# Patient Record
Sex: Male | Born: 1950
Health system: Southern US, Community
[De-identification: ages and names within clinical notes are randomized; demographics above are authoritative.]

## PROBLEM LIST (undated history)

## (undated) DIAGNOSIS — C220 Liver cell carcinoma: Secondary | ICD-10-CM

## (undated) DIAGNOSIS — K56609 Unspecified intestinal obstruction, unspecified as to partial versus complete obstruction: Secondary | ICD-10-CM

## (undated) DIAGNOSIS — I10 Essential (primary) hypertension: Secondary | ICD-10-CM

## (undated) DIAGNOSIS — Z9289 Personal history of other medical treatment: Secondary | ICD-10-CM

## (undated) HISTORY — PX: COLONOSCOPY W/ POLYPECTOMY: SHX1380

## (undated) HISTORY — DX: Unspecified intestinal obstruction, unspecified as to partial versus complete obstruction: K56.609

## (undated) HISTORY — PX: COLON SURGERY: SHX602

---
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
## 2004-04-06 ENCOUNTER — Emergency Department: Payer: Self-pay | Admitting: Emergency Medicine

## 2004-04-22 ENCOUNTER — Emergency Department: Payer: Self-pay | Admitting: Unknown Physician Specialty

## 2005-01-24 ENCOUNTER — Emergency Department: Payer: Self-pay | Admitting: General Practice

## 2009-07-27 ENCOUNTER — Emergency Department: Payer: Self-pay | Admitting: Internal Medicine

## 2013-05-18 ENCOUNTER — Emergency Department: Payer: Self-pay | Admitting: Emergency Medicine

## 2013-05-18 LAB — CBC
HCT: 45.4 % (ref 40.0–52.0)
HGB: 15.1 g/dL (ref 13.0–18.0)
MCH: 32.2 pg (ref 26.0–34.0)
MCHC: 33.2 g/dL (ref 32.0–36.0)
MCV: 97 fL (ref 80–100)
Platelet: 210 10*3/uL (ref 150–440)
RBC: 4.68 10*6/uL (ref 4.40–5.90)
RDW: 14.7 % — ABNORMAL HIGH (ref 11.5–14.5)
WBC: 6.8 10*3/uL (ref 3.8–10.6)

## 2013-05-18 LAB — COMPREHENSIVE METABOLIC PANEL
Albumin: 3.4 g/dL (ref 3.4–5.0)
Alkaline Phosphatase: 70 U/L
Anion Gap: 7 (ref 7–16)
BUN: 7 mg/dL (ref 7–18)
Bilirubin,Total: 0.7 mg/dL (ref 0.2–1.0)
Calcium, Total: 9.1 mg/dL (ref 8.5–10.1)
Chloride: 104 mmol/L (ref 98–107)
Co2: 29 mmol/L (ref 21–32)
Creatinine: 0.79 mg/dL (ref 0.60–1.30)
EGFR (African American): >60
EGFR (Non-African Amer.): >60
Glucose: 94 mg/dL (ref 65–99)
Osmolality: 277 (ref 275–301)
Potassium: 3.3 mmol/L — ABNORMAL LOW (ref 3.5–5.1)
SGOT(AST): 65 U/L — ABNORMAL HIGH (ref 15–37)
SGPT (ALT): 63 U/L (ref 12–78)
Sodium: 140 mmol/L (ref 136–145)
Total Protein: 8 g/dL (ref 6.4–8.2)

## 2013-08-20 ENCOUNTER — Ambulatory Visit: Payer: Self-pay | Admitting: Gastroenterology

## 2013-08-21 LAB — PATHOLOGY REPORT

## 2013-08-27 ENCOUNTER — Ambulatory Visit: Payer: Self-pay | Admitting: Surgery

## 2013-08-27 LAB — BASIC METABOLIC PANEL
Anion Gap: 2 — ABNORMAL LOW (ref 7–16)
BUN: 9 mg/dL (ref 7–18)
Calcium, Total: 8.7 mg/dL (ref 8.5–10.1)
Chloride: 104 mmol/L (ref 98–107)
Co2: 30 mmol/L (ref 21–32)
Creatinine: 0.69 mg/dL (ref 0.60–1.30)
EGFR (African American): >60
EGFR (Non-African Amer.): >60
Glucose: 90 mg/dL (ref 65–99)
Osmolality: 270 (ref 275–301)
Potassium: 3.6 mmol/L (ref 3.5–5.1)
Sodium: 136 mmol/L (ref 136–145)

## 2013-08-27 LAB — CBC WITH DIFFERENTIAL/PLATELET
Basophil #: 0 10*3/uL (ref 0.0–0.1)
Basophil %: 0.7 %
Eosinophil #: 0.1 10*3/uL (ref 0.0–0.7)
Eosinophil %: 2 %
HCT: 40.8 % (ref 40.0–52.0)
HGB: 13.5 g/dL (ref 13.0–18.0)
Lymphocyte #: 2.6 10*3/uL (ref 1.0–3.6)
Lymphocyte %: 41.1 %
MCH: 31 pg (ref 26.0–34.0)
MCHC: 33.1 g/dL (ref 32.0–36.0)
MCV: 94 fL (ref 80–100)
Monocyte #: 0.6 x10 3/mm (ref 0.2–1.0)
Monocyte %: 10.1 %
Neutrophil #: 2.9 10*3/uL (ref 1.4–6.5)
Neutrophil %: 46.1 %
Platelet: 325 10*3/uL (ref 150–440)
RBC: 4.35 10*6/uL — ABNORMAL LOW (ref 4.40–5.90)
RDW: 14.8 % — ABNORMAL HIGH (ref 11.5–14.5)
WBC: 6.3 10*3/uL (ref 3.8–10.6)

## 2013-09-03 ENCOUNTER — Inpatient Hospital Stay: Payer: Self-pay | Admitting: Surgery

## 2013-09-03 LAB — CREATININE, SERUM
Creatinine: 0.87 mg/dL (ref 0.60–1.30)
EGFR (African American): >60
EGFR (Non-African Amer.): >60

## 2013-09-03 LAB — POTASSIUM: Potassium: 4.2 mmol/L (ref 3.5–5.1)

## 2013-09-04 DIAGNOSIS — I498 Other specified cardiac arrhythmias: Secondary | ICD-10-CM

## 2013-09-04 LAB — CBC WITH DIFFERENTIAL/PLATELET
Basophil #: 0.1 10*3/uL (ref 0.0–0.1)
Basophil %: 0.5 %
Eosinophil #: 0 10*3/uL (ref 0.0–0.7)
Eosinophil %: 0.2 %
HCT: 25.9 % — ABNORMAL LOW (ref 40.0–52.0)
HGB: 8.5 g/dL — ABNORMAL LOW (ref 13.0–18.0)
Lymphocyte #: 1.7 10*3/uL (ref 1.0–3.6)
Lymphocyte %: 9.2 %
MCH: 31.5 pg (ref 26.0–34.0)
MCHC: 33 g/dL (ref 32.0–36.0)
MCV: 95 fL (ref 80–100)
Monocyte #: 1.2 x10 3/mm — ABNORMAL HIGH (ref 0.2–1.0)
Monocyte %: 6.4 %
Neutrophil #: 15.2 10*3/uL — ABNORMAL HIGH (ref 1.4–6.5)
Neutrophil %: 83.7 %
Platelet: 255 10*3/uL (ref 150–440)
RBC: 2.72 10*6/uL — ABNORMAL LOW (ref 4.40–5.90)
RDW: 15.8 % — ABNORMAL HIGH (ref 11.5–14.5)
WBC: 18.1 10*3/uL — ABNORMAL HIGH (ref 3.8–10.6)

## 2013-09-04 LAB — BASIC METABOLIC PANEL
Anion Gap: 13 (ref 7–16)
BUN: 15 mg/dL (ref 7–18)
Calcium, Total: 8 mg/dL — ABNORMAL LOW (ref 8.5–10.1)
Chloride: 103 mmol/L (ref 98–107)
Co2: 22 mmol/L (ref 21–32)
Creatinine: 1.94 mg/dL — ABNORMAL HIGH (ref 0.60–1.30)
EGFR (African American): 42 — ABNORMAL LOW
EGFR (Non-African Amer.): 36 — ABNORMAL LOW
Glucose: 196 mg/dL — ABNORMAL HIGH (ref 65–99)
Osmolality: 282 (ref 275–301)
Potassium: 4.4 mmol/L (ref 3.5–5.1)
Sodium: 138 mmol/L (ref 136–145)

## 2013-09-04 LAB — CK TOTAL AND CKMB (NOT AT ARMC)
CK, Total: 264 U/L
CK-MB: 1.2 ng/mL (ref 0.5–3.6)

## 2013-09-04 LAB — HEMOGLOBIN
HGB: 8.4 g/dL — ABNORMAL LOW (ref 13.0–18.0)
HGB: 7.8 g/dL — ABNORMAL LOW (ref 13.0–18.0)

## 2013-09-04 LAB — TROPONIN I: Troponin-I: <0.02 ng/mL

## 2013-09-05 LAB — CBC WITH DIFFERENTIAL/PLATELET
Basophil #: 0 10*3/uL (ref 0.0–0.1)
Basophil %: 0.1 %
Eosinophil #: 0 10*3/uL (ref 0.0–0.7)
Eosinophil %: 0 %
HCT: 20.2 % — ABNORMAL LOW (ref 40.0–52.0)
HGB: 6.9 g/dL — ABNORMAL LOW (ref 13.0–18.0)
Lymphocyte #: 1.3 10*3/uL (ref 1.0–3.6)
Lymphocyte %: 8.9 %
MCH: 29.7 pg (ref 26.0–34.0)
MCHC: 34.1 g/dL (ref 32.0–36.0)
MCV: 87 fL (ref 80–100)
Monocyte #: 1.3 x10 3/mm — ABNORMAL HIGH (ref 0.2–1.0)
Monocyte %: 9.1 %
Neutrophil #: 11.8 10*3/uL — ABNORMAL HIGH (ref 1.4–6.5)
Neutrophil %: 81.9 %
Platelet: 171 10*3/uL (ref 150–440)
RBC: 2.32 10*6/uL — ABNORMAL LOW (ref 4.40–5.90)
RDW: 19.4 % — ABNORMAL HIGH (ref 11.5–14.5)
WBC: 14.4 10*3/uL — ABNORMAL HIGH (ref 3.8–10.6)

## 2013-09-05 LAB — BASIC METABOLIC PANEL
Anion Gap: 8 (ref 7–16)
BUN: 18 mg/dL (ref 7–18)
Calcium, Total: 7.4 mg/dL — ABNORMAL LOW (ref 8.5–10.1)
Chloride: 109 mmol/L — ABNORMAL HIGH (ref 98–107)
Co2: 24 mmol/L (ref 21–32)
Creatinine: 0.98 mg/dL (ref 0.60–1.30)
EGFR (African American): >60
EGFR (Non-African Amer.): >60
Glucose: 107 mg/dL — ABNORMAL HIGH (ref 65–99)
Osmolality: 284 (ref 275–301)
Potassium: 4.1 mmol/L (ref 3.5–5.1)
Sodium: 141 mmol/L (ref 136–145)

## 2013-09-05 LAB — HEMOGLOBIN: HGB: 9.4 g/dL — ABNORMAL LOW (ref 13.0–18.0)

## 2013-09-06 LAB — CBC WITH DIFFERENTIAL/PLATELET
Basophil #: 0 10*3/uL (ref 0.0–0.1)
Basophil %: 0.1 %
Eosinophil #: 0 10*3/uL (ref 0.0–0.7)
Eosinophil %: 0.1 %
HCT: 27.1 % — ABNORMAL LOW (ref 40.0–52.0)
HGB: 8.9 g/dL — ABNORMAL LOW (ref 13.0–18.0)
Lymphocyte #: 1.2 10*3/uL (ref 1.0–3.6)
Lymphocyte %: 9.6 %
MCH: 28.7 pg (ref 26.0–34.0)
MCHC: 32.8 g/dL (ref 32.0–36.0)
MCV: 88 fL (ref 80–100)
Monocyte #: 0.9 x10 3/mm (ref 0.2–1.0)
Monocyte %: 7.5 %
Neutrophil #: 10.1 10*3/uL — ABNORMAL HIGH (ref 1.4–6.5)
Neutrophil %: 82.7 %
Platelet: 179 10*3/uL (ref 150–440)
RBC: 3.09 10*6/uL — ABNORMAL LOW (ref 4.40–5.90)
RDW: 18.9 % — ABNORMAL HIGH (ref 11.5–14.5)
WBC: 12.2 10*3/uL — ABNORMAL HIGH (ref 3.8–10.6)

## 2013-09-06 LAB — BASIC METABOLIC PANEL
Anion Gap: 9 (ref 7–16)
BUN: 13 mg/dL (ref 7–18)
Calcium, Total: 7.7 mg/dL — ABNORMAL LOW (ref 8.5–10.1)
Chloride: 108 mmol/L — ABNORMAL HIGH (ref 98–107)
Co2: 25 mmol/L (ref 21–32)
Creatinine: 0.64 mg/dL (ref 0.60–1.30)
EGFR (African American): >60
EGFR (Non-African Amer.): >60
Glucose: 96 mg/dL (ref 65–99)
Osmolality: 283 (ref 275–301)
Potassium: 3.6 mmol/L (ref 3.5–5.1)
Sodium: 142 mmol/L (ref 136–145)

## 2013-09-06 LAB — PATHOLOGY REPORT

## 2013-09-07 LAB — BASIC METABOLIC PANEL
Anion Gap: 8 (ref 7–16)
BUN: 13 mg/dL (ref 7–18)
Calcium, Total: 7.8 mg/dL — ABNORMAL LOW (ref 8.5–10.1)
Chloride: 111 mmol/L — ABNORMAL HIGH (ref 98–107)
Co2: 25 mmol/L (ref 21–32)
Creatinine: 0.64 mg/dL (ref 0.60–1.30)
EGFR (African American): >60
EGFR (Non-African Amer.): >60
Glucose: 84 mg/dL (ref 65–99)
Osmolality: 286 (ref 275–301)
Potassium: 3.6 mmol/L (ref 3.5–5.1)
Sodium: 144 mmol/L (ref 136–145)

## 2013-09-07 LAB — CBC WITH DIFFERENTIAL/PLATELET
Basophil #: 0 10*3/uL (ref 0.0–0.1)
Basophil %: 0.1 %
Eosinophil #: 0.1 10*3/uL (ref 0.0–0.7)
Eosinophil %: 0.5 %
HCT: 28.4 % — ABNORMAL LOW (ref 40.0–52.0)
HGB: 9.4 g/dL — ABNORMAL LOW (ref 13.0–18.0)
Lymphocyte #: 1.6 10*3/uL (ref 1.0–3.6)
Lymphocyte %: 14.8 %
MCH: 29.3 pg (ref 26.0–34.0)
MCHC: 32.9 g/dL (ref 32.0–36.0)
MCV: 89 fL (ref 80–100)
Monocyte #: 0.8 x10 3/mm (ref 0.2–1.0)
Monocyte %: 7.8 %
Neutrophil #: 8.1 10*3/uL — ABNORMAL HIGH (ref 1.4–6.5)
Neutrophil %: 76.8 %
Platelet: 232 10*3/uL (ref 150–440)
RBC: 3.19 10*6/uL — ABNORMAL LOW (ref 4.40–5.90)
RDW: 18.3 % — ABNORMAL HIGH (ref 11.5–14.5)
WBC: 10.6 10*3/uL (ref 3.8–10.6)

## 2013-09-09 LAB — CBC WITH DIFFERENTIAL/PLATELET
Basophil #: 0 10*3/uL (ref 0.0–0.1)
Basophil %: 0.2 %
Eosinophil #: 0.1 10*3/uL (ref 0.0–0.7)
Eosinophil %: 1.2 %
HCT: 24.6 % — ABNORMAL LOW (ref 40.0–52.0)
HGB: 8.4 g/dL — ABNORMAL LOW (ref 13.0–18.0)
Lymphocyte #: 0.9 10*3/uL — ABNORMAL LOW (ref 1.0–3.6)
Lymphocyte %: 11.9 %
MCH: 30.1 pg (ref 26.0–34.0)
MCHC: 34.3 g/dL (ref 32.0–36.0)
MCV: 88 fL (ref 80–100)
Monocyte #: 1.1 x10 3/mm — ABNORMAL HIGH (ref 0.2–1.0)
Monocyte %: 14 %
Neutrophil #: 5.6 10*3/uL (ref 1.4–6.5)
Neutrophil %: 72.7 %
Platelet: 288 10*3/uL (ref 150–440)
RBC: 2.81 10*6/uL — ABNORMAL LOW (ref 4.40–5.90)
RDW: 18.5 % — ABNORMAL HIGH (ref 11.5–14.5)
WBC: 7.7 10*3/uL (ref 3.8–10.6)

## 2013-09-09 LAB — BASIC METABOLIC PANEL
Anion Gap: 6 — ABNORMAL LOW (ref 7–16)
BUN: 11 mg/dL (ref 7–18)
Calcium, Total: 7.9 mg/dL — ABNORMAL LOW (ref 8.5–10.1)
Chloride: 108 mmol/L — ABNORMAL HIGH (ref 98–107)
Co2: 32 mmol/L (ref 21–32)
Creatinine: 0.64 mg/dL (ref 0.60–1.30)
EGFR (African American): >60
EGFR (Non-African Amer.): >60
Glucose: 93 mg/dL (ref 65–99)
Osmolality: 290 (ref 275–301)
Potassium: 3.1 mmol/L — ABNORMAL LOW (ref 3.5–5.1)
Sodium: 146 mmol/L — ABNORMAL HIGH (ref 136–145)

## 2013-09-10 LAB — CBC WITH DIFFERENTIAL/PLATELET
Basophil #: 0 10*3/uL (ref 0.0–0.1)
Basophil %: 0.2 %
Eosinophil #: 0.1 10*3/uL (ref 0.0–0.7)
Eosinophil %: 1.4 %
HCT: 26.7 % — ABNORMAL LOW (ref 40.0–52.0)
HGB: 9 g/dL — ABNORMAL LOW (ref 13.0–18.0)
Lymphocyte #: 1.5 10*3/uL (ref 1.0–3.6)
Lymphocyte %: 15.1 %
MCH: 30 pg (ref 26.0–34.0)
MCHC: 33.7 g/dL (ref 32.0–36.0)
MCV: 89 fL (ref 80–100)
Monocyte #: 1.1 x10 3/mm — ABNORMAL HIGH (ref 0.2–1.0)
Monocyte %: 11.4 %
Neutrophil #: 7.1 10*3/uL — ABNORMAL HIGH (ref 1.4–6.5)
Neutrophil %: 71.9 %
Platelet: 349 10*3/uL (ref 150–440)
RBC: 3 10*6/uL — ABNORMAL LOW (ref 4.40–5.90)
RDW: 18.6 % — ABNORMAL HIGH (ref 11.5–14.5)
WBC: 9.9 10*3/uL (ref 3.8–10.6)

## 2013-09-10 LAB — BASIC METABOLIC PANEL
Anion Gap: 6 — ABNORMAL LOW (ref 7–16)
BUN: 9 mg/dL (ref 7–18)
Calcium, Total: 7.9 mg/dL — ABNORMAL LOW (ref 8.5–10.1)
Chloride: 107 mmol/L (ref 98–107)
Co2: 30 mmol/L (ref 21–32)
Creatinine: 0.77 mg/dL (ref 0.60–1.30)
EGFR (African American): >60
EGFR (Non-African Amer.): >60
Glucose: 91 mg/dL (ref 65–99)
Osmolality: 283 (ref 275–301)
Potassium: 3.5 mmol/L (ref 3.5–5.1)
Sodium: 143 mmol/L (ref 136–145)

## 2013-09-12 LAB — CBC WITH DIFFERENTIAL/PLATELET
Basophil #: 0 10*3/uL (ref 0.0–0.1)
Basophil %: 0.1 %
Eosinophil #: 0.1 10*3/uL (ref 0.0–0.7)
Eosinophil %: 0.9 %
HCT: 27.6 % — ABNORMAL LOW (ref 40.0–52.0)
HGB: 9.2 g/dL — ABNORMAL LOW (ref 13.0–18.0)
Lymphocyte #: 1.7 10*3/uL (ref 1.0–3.6)
Lymphocyte %: 13.5 %
MCH: 29.3 pg (ref 26.0–34.0)
MCHC: 33.3 g/dL (ref 32.0–36.0)
MCV: 88 fL (ref 80–100)
Monocyte #: 1.1 x10 3/mm — ABNORMAL HIGH (ref 0.2–1.0)
Monocyte %: 8.9 %
Neutrophil #: 9.5 10*3/uL — ABNORMAL HIGH (ref 1.4–6.5)
Neutrophil %: 76.6 %
Platelet: 516 10*3/uL — ABNORMAL HIGH (ref 150–440)
RBC: 3.13 10*6/uL — ABNORMAL LOW (ref 4.40–5.90)
RDW: 18.3 % — ABNORMAL HIGH (ref 11.5–14.5)
WBC: 12.4 10*3/uL — ABNORMAL HIGH (ref 3.8–10.6)

## 2013-09-12 LAB — PROTIME-INR
INR: 1.1
Prothrombin Time: 13.8 secs (ref 11.5–14.7)

## 2013-09-12 LAB — APTT: Activated PTT: 37.7 secs — ABNORMAL HIGH (ref 23.6–35.9)

## 2013-09-13 LAB — COMPREHENSIVE METABOLIC PANEL
Albumin: 1.9 g/dL — ABNORMAL LOW (ref 3.4–5.0)
Alkaline Phosphatase: 218 U/L — ABNORMAL HIGH
Anion Gap: 7 (ref 7–16)
BUN: 4 mg/dL — ABNORMAL LOW (ref 7–18)
Bilirubin,Total: 0.9 mg/dL (ref 0.2–1.0)
Calcium, Total: 8.1 mg/dL — ABNORMAL LOW (ref 8.5–10.1)
Chloride: 105 mmol/L (ref 98–107)
Co2: 27 mmol/L (ref 21–32)
Creatinine: 0.67 mg/dL (ref 0.60–1.30)
EGFR (African American): >60
EGFR (Non-African Amer.): >60
Glucose: 108 mg/dL — ABNORMAL HIGH (ref 65–99)
Osmolality: 275 (ref 275–301)
Potassium: 3.3 mmol/L — ABNORMAL LOW (ref 3.5–5.1)
SGOT(AST): 30 U/L (ref 15–37)
SGPT (ALT): 27 U/L
Sodium: 139 mmol/L (ref 136–145)
Total Protein: 6.2 g/dL — ABNORMAL LOW (ref 6.4–8.2)

## 2013-09-13 LAB — CBC WITH DIFFERENTIAL/PLATELET
Basophil #: 0 10*3/uL (ref 0.0–0.1)
Basophil %: 0.2 %
Eosinophil #: 0.1 10*3/uL (ref 0.0–0.7)
Eosinophil %: 0.6 %
HCT: 28.4 % — ABNORMAL LOW (ref 40.0–52.0)
HGB: 9.1 g/dL — ABNORMAL LOW (ref 13.0–18.0)
Lymphocyte #: 1.6 10*3/uL (ref 1.0–3.6)
Lymphocyte %: 13.4 %
MCH: 28.6 pg (ref 26.0–34.0)
MCHC: 32.1 g/dL (ref 32.0–36.0)
MCV: 89 fL (ref 80–100)
Monocyte #: 1.1 x10 3/mm — ABNORMAL HIGH (ref 0.2–1.0)
Monocyte %: 9.2 %
Neutrophil #: 9 10*3/uL — ABNORMAL HIGH (ref 1.4–6.5)
Neutrophil %: 76.6 %
Platelet: 542 10*3/uL — ABNORMAL HIGH (ref 150–440)
RBC: 3.19 10*6/uL — ABNORMAL LOW (ref 4.40–5.90)
RDW: 18.3 % — ABNORMAL HIGH (ref 11.5–14.5)
WBC: 11.7 10*3/uL — ABNORMAL HIGH (ref 3.8–10.6)

## 2013-09-13 LAB — APTT: Activated PTT: 39 secs — ABNORMAL HIGH (ref 23.6–35.9)

## 2013-09-13 LAB — PROTIME-INR
INR: 1
Prothrombin Time: 13.4 secs (ref 11.5–14.7)

## 2013-09-14 LAB — CBC WITH DIFFERENTIAL/PLATELET
Basophil #: 0 10*3/uL (ref 0.0–0.1)
Basophil %: 0.2 %
Eosinophil #: 0.2 10*3/uL (ref 0.0–0.7)
Eosinophil %: 0.9 %
HCT: 29.2 % — ABNORMAL LOW (ref 40.0–52.0)
HGB: 9.8 g/dL — ABNORMAL LOW (ref 13.0–18.0)
Lymphocyte #: 2.3 10*3/uL (ref 1.0–3.6)
Lymphocyte %: 14.4 %
MCH: 29.7 pg (ref 26.0–34.0)
MCHC: 33.6 g/dL (ref 32.0–36.0)
MCV: 88 fL (ref 80–100)
Monocyte #: 1.1 x10 3/mm — ABNORMAL HIGH (ref 0.2–1.0)
Monocyte %: 7 %
Neutrophil #: 12.4 10*3/uL — ABNORMAL HIGH (ref 1.4–6.5)
Neutrophil %: 77.5 %
Platelet: 625 10*3/uL — ABNORMAL HIGH (ref 150–440)
RBC: 3.31 10*6/uL — ABNORMAL LOW (ref 4.40–5.90)
RDW: 17.8 % — ABNORMAL HIGH (ref 11.5–14.5)
WBC: 16 10*3/uL — ABNORMAL HIGH (ref 3.8–10.6)

## 2014-06-08 NOTE — Op Note (Signed)
PATIENT NAME:  Samuel Hood, Samuel Hood MR#:  [id number] DATE OF BIRTH:  14-Nov-1950  DATE OF PROCEDURE:  09/04/2013  PREOPERATIVE DIAGNOSIS: Postoperative hemorrhage.   POSTOPERATIVE DIAGNOSIS: Postoperative hemorrhage.   PROCEDURE: Reexploration for hemorrhage.   SURGEON: Dia Crawford, MD  ANESTHESIA: General.  DESCRIPTION OF PROCEDURE: With the patient in the supine position, after the induction of appropriate general anesthesia, the patient's abdomen was prepped with ChloraPrep and draped with sterile towels. The previous incision was opened and drain removed. The fascial sutures were removed as were the peritoneal sutures. A large amount of free old blood was removed. Approximately 750 mL of liquid blood was removed plus equal amount of clot. The abdomen was carefully examined in the site of the previous surgery. There were 2 small bleeding points noted, 1 in the mesenteric closure and 1 along the pelvic side wall. There did not appear to be any obvious significant hemorrhage. Both areas were identified, 1 was clipped and 1 was oversewn with 3-0 Vicryl. The retroperitoneum was then loosely approximated over the mesentery to allow for some tamponade. There did not appear to be any significant bleeding remaining. The ureters and arteries were identified and protected. The area was copiously irrigated. The retroperitoneum was observed for approximately 15 minutes and no further bleeding was encountered. Avitene powder was placed in the retroperitoneum, in the sites that were uncovered. Bowel contents were returned to their anatomic position. Of note is the fact that I extended the incision superiorly in order to get better exposure of the superiormost dissection of the left colon. No other abnormalities were identified. The peritoneum was closed with running suture of 0 Vicryl. The fascia was closed with interrupted figure-of-eight suture of 0 Maxon. The skin incision that had been formed into the letter T was closed  with clips over a Penrose drain. Sterile dressings were applied. The patient was returned to the recovery room having tolerated the procedure well. Sponge, instrument and needle counts were correct x2 in the operating room.  ____________________________ Rodena Goldmann III, MD rle:sb D: 09/04/2013 12:22:33 ET T: 09/04/2013 13:14:44 ET JOB#: [id number]  cc: Rodena Goldmann III, MD, <Dictator> Rodena Goldmann MD ELECTRONICALLY SIGNED 09/07/2013 7:18

## 2014-06-08 NOTE — Op Note (Signed)
PATIENT NAME:  Samuel Hood, Samuel Hood MR#:  [id number] DATE OF BIRTH:  08-15-1950  DATE OF PROCEDURE:  09/03/2013  PREOPERATIVE DIAGNOSIS: Sigmoid colon polyp.   POSTOPERATIVE DIAGNOSIS: Sigmoid colon polyp.   PROCEDURE: Robotic-assisted laparoscopic sigmoid resection.   SURGEON: Dia Crawford, MD  ASSISTANT: Nestor Lewandowsky, MD  ANESTHESIA: General.   OPERATIVE PROCEDURE: With the patient in the supine position after the induction of appropriate general anesthesia the patient's abdomen was prepped with ChloraPrep and draped with sterile towels. The patient was placed in the lithotomy position, appropriately padded into position. The robot ports were inserted with the camera port in the right upper quadrant. An Optiview port was used to cannulate the peritoneal cavity. CO2 was insufflated to appropriate pressure measurements. A right lower quadrant transverse incision was made and an 8.5 mm port inserted under direct vision, an upper midline port was placed under direct vision and a left upper quadrant port placed under direct vision. The robot was then brought to the table, docked to the ports without difficulty. The robotic arms were inserted under direct vision directed to the left lower quadrant. I then moved to the console. The area of sigmoid colon which had been identified with Niger ink was easily visualized. The left colon and sigmoid colon were mobilized using a combination of blunt, sharp and Bovie dissection. Iliac artery and left ureter were identified without difficulty and the bowel mobilized well into the pelvis. I did not feel that the splenic flexure required mobilization as the bowel appeared to be significantly mobile. The robot was then undocked after removing the instruments. The ports were withdrawn without difficulty and abdomen desufflated. A Pfannenstiel incision was made in the standard fashion transversely across the suprapubic area, carried down through the subcutaneous tissue with Bovie  electrocautery. The anterior fascia was divided laterally and flaps created superiorly and laterally. The muscle was separated and the peritoneum opened without difficulty. The bowel was elevated to the incision with plenty mobility. Proximal and distal bowel was divided with a GIA-55 stapling device. The mesentery was divided with the LigaSure apparatus. Specimen was passed off the table. Two loops of bowel were placed side by side and a small enterotomy was made in each loop of bowel. The GIA stapler was brought back to the table and inserted in each loop of bowel with the stapler approximated and fired. No significant bleeding was encountered and the anastomosis appeared to be satisfactory. The enterotomy was closed with a single application of the XB-28 stapling device carrying a green load. The staple line was over-sewn with 3-0 silk and 3-0 Vicryl. The mesenteric defect was closed with 3-0 Vicryl. The anastomosis appeared to be satisfactory. Bowel contents were returned to their anatomic position. The area was copiously suctioned and irrigated. The peritoneum was closed with running suture of zero Vicryl and the fascia was closed with interrupted figure-of-eight sutures of 0 Maxon. A drain was placed in the dead space using a Penrose drain and the skin incisions clipped. Sterile dressings were applied. The patient was returned to the recovery room having tolerated the procedure well. Sponge, instrument and needle counts were correct x2 in the operating room.  ____________________________ Micheline Maze, MD rle:sb D: 09/03/2013 10:14:36 ET T: 09/03/2013 10:38:32 ET JOB#: [id number]  cc: Micheline Maze, MD, <Dictator> Scot Jun. Jenne Campus, PA-C Arther Dames, MD Rodena Goldmann MD ELECTRONICALLY SIGNED 09/07/2013 7:18

## 2014-06-08 NOTE — Consult Note (Signed)
Details:   - GI Note:  Appreciate the excellent care of Mr Hissong.  Please call with any questions or concerns. He will need surveillance colonoscopy in about 1 year.   Electronic Signatures: Arther Dames (MD)  (Signed 23-Jul-15 15:56)  Authored: Details   Last Updated: 23-Jul-15 15:56 by Arther Dames (MD)

## 2014-06-08 NOTE — Discharge Summary (Signed)
PATIENT NAME:  [name], [name] MR#:  [id number] DATE OF BIRTH:  12/02/1950  DATE OF ADMISSION:  09/03/2013 DATE OF DISCHARGE:  09/15/2013  BRIEF HISTORY: Samuel Hood is a 64 year old gentleman with a recently discovered large polyp in the sigmoid colon. He was admitted on the 20th for a robotic-assisted laparoscopic sigmoid resection. He underwent mechanical and antibiotic bowel prep. The procedure was uncomplicated. He had no significant intraoperative problems. However, the evening following surgery he had significant hypotension, tachycardia and dropped his hemoglobin 5 grams. He was markedly distended. He was taken back to surgery on the morning of the 21st where he underwent exploratory laparotomy. There were almost 2 liters of blood, and clot in his abdomen, but no significant bleeding source was encountered. I believe that the bleeding site was a place on the mesenteric closure, but I do not see any other evidence of bleeding in the abdomen. He had very slow return of bowel function. Hemoglobin was stabilized, but it took him several more days in order to develop any bowel function. Profound ileus which required nasogastric suction for almost 48 hours. He finally began to have some normal stool by the 29th and moved to a regular diet by the 31st. He will be discharged home on the 1st and will be followed in the office in 7 to 10 days' time. Bathing, activity, and driving instructions were given to the patient.   DISCHARGE MEDICATIONS: Include: hydrochlorothiazide 25 mg p.o. daily, Vicodin 5/325 every 4 hours p.r.n. and iron 150 mg p.o. b.i.d.   FINAL DISCHARGE DIAGNOSES:  1. Dysplastic polyp sigmoid colon.  2. Postoperative hemorrhage.  SURGERY:  1. Robot-assisted laparoscopic sigmoid colectomy.  2. Re-exploration for hemorrhage.    ____________________________ Micheline Maze, MD rle:jh D: 09/18/2013 22:53:01 ET T: 09/19/2013 05:38:34 ET JOB#: [id number]  cc: Rodena Goldmann III, MD,  <Dictator> Rodena Goldmann MD ELECTRONICALLY SIGNED 09/19/2013 19:17

## 2014-06-18 ENCOUNTER — Encounter: Payer: Self-pay | Admitting: Emergency Medicine

## 2014-06-18 ENCOUNTER — Inpatient Hospital Stay: Payer: No Typology Code available for payment source

## 2014-06-18 ENCOUNTER — Emergency Department: Payer: No Typology Code available for payment source

## 2014-06-18 ENCOUNTER — Inpatient Hospital Stay
Admission: EM | Admit: 2014-06-18 | Discharge: 2014-06-22 | DRG: 390 | Disposition: A | Discharge status: Home / Self Care | Payer: No Typology Code available for payment source | Attending: Surgery | Admitting: Surgery

## 2014-06-18 DIAGNOSIS — Z809 Family history of malignant neoplasm, unspecified: Secondary | ICD-10-CM

## 2014-06-18 DIAGNOSIS — Z86010 Personal history of colonic polyps: Secondary | ICD-10-CM

## 2014-06-18 DIAGNOSIS — Z9049 Acquired absence of other specified parts of digestive tract: Secondary | ICD-10-CM | POA: Diagnosis present

## 2014-06-18 DIAGNOSIS — R1084 Generalized abdominal pain: Secondary | ICD-10-CM

## 2014-06-18 DIAGNOSIS — K5660 Unspecified intestinal obstruction: Secondary | ICD-10-CM | POA: Diagnosis present

## 2014-06-18 DIAGNOSIS — F17210 Nicotine dependence, cigarettes, uncomplicated: Secondary | ICD-10-CM | POA: Diagnosis present

## 2014-06-18 DIAGNOSIS — I10 Essential (primary) hypertension: Secondary | ICD-10-CM | POA: Diagnosis present

## 2014-06-18 DIAGNOSIS — E876 Hypokalemia: Secondary | ICD-10-CM | POA: Diagnosis present

## 2014-06-18 DIAGNOSIS — K219 Gastro-esophageal reflux disease without esophagitis: Secondary | ICD-10-CM | POA: Diagnosis present

## 2014-06-18 DIAGNOSIS — K56609 Unspecified intestinal obstruction, unspecified as to partial versus complete obstruction: Secondary | ICD-10-CM | POA: Diagnosis present

## 2014-06-18 HISTORY — DX: Essential (primary) hypertension: I10

## 2014-06-18 LAB — CBC WITH DIFFERENTIAL/PLATELET
Basophils Absolute: 0 10*3/uL (ref 0–0.1)
Basophils Relative: 1 %
Eosinophils Absolute: 0 10*3/uL (ref 0–0.7)
Eosinophils Relative: 0 %
HCT: 49.6 % (ref 40.0–52.0)
Hemoglobin: 16.6 g/dL (ref 13.0–18.0)
Lymphocytes Relative: 17 %
Lymphs Abs: 1.2 10*3/uL (ref 1.0–3.6)
MCH: 31.1 pg (ref 26.0–34.0)
MCHC: 33.6 g/dL (ref 32.0–36.0)
MCV: 92.7 fL (ref 80.0–100.0)
Monocytes Absolute: 0.7 10*3/uL (ref 0.2–1.0)
Monocytes Relative: 10 %
Neutro Abs: 5.1 10*3/uL (ref 1.4–6.5)
Neutrophils Relative %: 72 %
Platelets: 299 10*3/uL (ref 150–440)
RBC: 5.36 MIL/uL (ref 4.40–5.90)
RDW: 15.3 % — ABNORMAL HIGH (ref 11.5–14.5)
WBC: 7.1 10*3/uL (ref 3.8–10.6)

## 2014-06-18 LAB — COMPREHENSIVE METABOLIC PANEL
ALT: 44 U/L (ref 17–63)
AST: 59 U/L — ABNORMAL HIGH (ref 15–41)
Albumin: 3.7 g/dL (ref 3.5–5.0)
Alkaline Phosphatase: 63 U/L (ref 38–126)
Anion gap: 11 (ref 5–15)
BUN: 16 mg/dL (ref 6–20)
CO2: 32 mmol/L (ref 22–32)
Calcium: 9.5 mg/dL (ref 8.9–10.3)
Chloride: 98 mmol/L — ABNORMAL LOW (ref 101–111)
Creatinine, Ser: 0.86 mg/dL (ref 0.61–1.24)
GFR calc Af Amer: >60 mL/min (ref >60)
GFR calc non Af Amer: >60 mL/min (ref >60)
Glucose, Bld: 124 mg/dL — ABNORMAL HIGH (ref 65–99)
Potassium: 3.1 mmol/L — ABNORMAL LOW (ref 3.5–5.1)
Sodium: 141 mmol/L (ref 135–145)
Total Bilirubin: 1 mg/dL (ref 0.3–1.2)
Total Protein: 8.4 g/dL — ABNORMAL HIGH (ref 6.5–8.1)

## 2014-06-18 MED ORDER — ONDANSETRON HCL 4 MG/2ML IJ SOLN
4.0000 mg | Freq: Once | INTRAMUSCULAR | Status: AC
  Administered 2014-06-18: 4 mg via INTRAVENOUS

## 2014-06-18 MED ORDER — DIPHENHYDRAMINE HCL 12.5 MG/5ML PO ELIX
12.5000 mg | ORAL_SOLUTION | Freq: Four times a day (QID) | ORAL | Status: DC | PRN
  Filled 2014-06-18: qty 5

## 2014-06-18 MED ORDER — KCL IN DEXTROSE-NACL 20-5-0.9 MEQ/L-%-% IV SOLN
INTRAVENOUS | Status: DC
  Administered 2014-06-18 – 2014-06-21 (×9): via INTRAVENOUS
  Filled 2014-06-18 (×15): qty 1000

## 2014-06-18 MED ORDER — HYDROMORPHONE HCL 1 MG/ML IJ SOLN
INTRAMUSCULAR | Status: AC
  Administered 2014-06-18: 0.5 mg via INTRAVENOUS
  Filled 2014-06-18: qty 1

## 2014-06-18 MED ORDER — HYDROMORPHONE HCL 1 MG/ML IJ SOLN
0.5000 mg | INTRAMUSCULAR | Status: AC | PRN
  Administered 2014-06-18 – 2014-06-20 (×2): 0.5 mg via INTRAVENOUS
  Filled 2014-06-18: qty 1

## 2014-06-18 MED ORDER — ONDANSETRON HCL 4 MG/2ML IJ SOLN
4.0000 mg | Freq: Four times a day (QID) | INTRAMUSCULAR | Status: DC | PRN
  Administered 2014-06-19 – 2014-06-21 (×3): 4 mg via INTRAVENOUS
  Filled 2014-06-18 (×4): qty 2

## 2014-06-18 MED ORDER — BUTAMBEN-TETRACAINE-BENZOCAINE 2-2-14 % EX AERO
4.0000 | INHALATION_SPRAY | Freq: Once | CUTANEOUS | Status: AC
  Administered 2014-06-18: 4 via TOPICAL

## 2014-06-18 MED ORDER — SODIUM CHLORIDE 0.9 % IV BOLUS (SEPSIS)
1000.0000 mL | Freq: Once | INTRAVENOUS | Status: AC
  Administered 2014-06-18: 1000 mL via INTRAVENOUS

## 2014-06-18 MED ORDER — ONDANSETRON HCL 4 MG/2ML IJ SOLN
INTRAMUSCULAR | Status: AC
  Administered 2014-06-18: 4 mg via INTRAVENOUS
  Filled 2014-06-18: qty 2

## 2014-06-18 MED ORDER — IOHEXOL 300 MG/ML  SOLN
100.0000 mL | Freq: Once | INTRAMUSCULAR | Status: AC | PRN
  Administered 2014-06-18: 100 mL via INTRAVENOUS

## 2014-06-18 MED ORDER — ACETAMINOPHEN 325 MG PO TABS: 650.0000 mg | ORAL_TABLET | Freq: Four times a day (QID) | ORAL | Status: DC | PRN

## 2014-06-18 MED ORDER — ACETAMINOPHEN 650 MG RE SUPP: 650.0000 mg | Freq: Four times a day (QID) | RECTAL | Status: DC | PRN

## 2014-06-18 MED ORDER — PANTOPRAZOLE SODIUM 40 MG IV SOLR
40.0000 mg | Freq: Every day | INTRAVENOUS | Status: DC
  Administered 2014-06-19 – 2014-06-21 (×4): 40 mg via INTRAVENOUS
  Filled 2014-06-18 (×4): qty 40

## 2014-06-18 MED ORDER — PHENOL 1.4 % MT LIQD
1.0000 | OROMUCOSAL | Status: DC | PRN
  Administered 2014-06-21: 1 via OROMUCOSAL
  Filled 2014-06-18 (×2): qty 177

## 2014-06-18 MED ORDER — MORPHINE SULFATE 4 MG/ML IJ SOLN
INTRAMUSCULAR | Status: AC
  Administered 2014-06-18: 4 mg via INTRAVENOUS
  Filled 2014-06-18: qty 1

## 2014-06-18 MED ORDER — DIPHENHYDRAMINE HCL 50 MG/ML IJ SOLN: 12.5000 mg | Freq: Four times a day (QID) | INTRAMUSCULAR | Status: DC | PRN

## 2014-06-18 MED ORDER — MORPHINE SULFATE 2 MG/ML IJ SOLN
2.0000 mg | INTRAMUSCULAR | Status: DC | PRN
  Administered 2014-06-18: 4 mg via INTRAVENOUS
  Administered 2014-06-19 (×4): 6 mg via INTRAVENOUS
  Administered 2014-06-19: 4 mg via INTRAVENOUS
  Administered 2014-06-19: 6 mg via INTRAVENOUS
  Administered 2014-06-20: 4 mg via INTRAVENOUS
  Administered 2014-06-20 (×2): 6 mg via INTRAVENOUS
  Administered 2014-06-20: 4 mg via INTRAVENOUS
  Administered 2014-06-21 (×2): 6 mg via INTRAVENOUS
  Filled 2014-06-18 (×3): qty 3
  Filled 2014-06-18: qty 2
  Filled 2014-06-18 (×2): qty 3
  Filled 2014-06-18 (×2): qty 2
  Filled 2014-06-18 (×2): qty 3
  Filled 2014-06-18: qty 2
  Filled 2014-06-18 (×2): qty 3

## 2014-06-18 NOTE — ED Notes (Signed)
Sent in from [place] with poss bowel obstruction

## 2014-06-18 NOTE — ED Notes (Signed)
Pt had abdomen surgery a year ago; Went to Dr this am and was sent here; has abdomen pain since Sunday. Surgery last year around September /october.

## 2014-06-18 NOTE — ED Provider Notes (Signed)
[facility] Emergency Department Provider Note    ____________________________________________  Time seen: 11:40 AM  I have reviewed the triage vital signs and the nursing notes as well as the notes from urgent care where the patient was seen earlier today.   HISTORY  Chief Complaint Abdominal Pain   Patient is alert and communicative.    HPI Samuel Hood. is a 64 y.o. male who complains of abdominal pain beginning this past Sunday. For 3 days he has had decreased bowel movement and flatulence and increased distention of his abdomen. The pain is constant and moderate to severe. This patient had a sigmoid colon resection due to a polyp in December 2015. This procedure was done here at [facility] by Dr. Pat Patrick. The symptoms are constant with out variation. He does have some nausea.     No past medical history on file.  There are no active problems to display for this patient.   Past Surgical History  Procedure Laterality Date  . Colon surgery      Current Outpatient Rx  Name  Route  Sig  Dispense  Refill  . aspirin 325 MG tablet   Oral   Take 325 mg by mouth every 6 (six) hours as needed for mild pain.         . hydrochlorothiazide (HYDRODIURIL) 25 MG tablet   Oral   Take 25 mg by mouth daily.         Marland Kitchen ibuprofen (ADVIL,MOTRIN) 100 MG tablet   Oral   Take 200 mg by mouth every 6 (six) hours as needed for pain or fever.           Allergies Review of patient's allergies indicates no known allergies.  No family history on file.  Social History History  Substance Use Topics  . Smoking status: Current Every Day Smoker  . Smokeless tobacco: Not on file  . Alcohol Use: Yes    Review of Systems  Constitutional: Negative for fever. Eyes: Negative for visual changes. ENT: Negative for sore throat. Cardiovascular: Negative for chest pain. Respiratory: Negative for shortness of breath. Gastrointestinal: Positive for  abdominal pain and a reduced tolerance for by mouth intake. Genitourinary: Negative for dysuria. Musculoskeletal: Negative for back pain. Skin: Negative for rash. Neurological: Negative for headaches, focal weakness or numbness. 10-point ROS otherwise negative.  ____________________________________________   PHYSICAL EXAM:  VITAL SIGNS: ED Triage Vitals  Enc Vitals Group     BP 06/18/14 1129 149/100 mmHg     Pulse --      Resp --      Temp 06/18/14 1129 98 F (36.7 C)     Temp Source 06/18/14 1129 Oral     SpO2 06/18/14 1129 99 %     Weight 06/18/14 1129 150 lb (68.04 kg)     Height 06/18/14 1129 '5\' 5"'$  (1.651 m)     Head Cir --      Peak Flow --      Pain Score 06/18/14 1131 8     Pain Loc --      Pain Edu? --      Excl. in [place]? --      Constitutional: Alert and oriented. Well appearing and in no distress. Eyes: Conjunctivae are normal. PERRL. Normal extraocular movements. ENT   Head: Normocephalic and atraumatic.   Nose: No congestion/rhinnorhea.   Mouth/Throat: Mucous membranes are moist.   Neck: No stridor. Hematological/Lymphatic/Immunilogical: No cervical lymphadenopathy. Cardiovascular: Normal rate, regular rhythm. Normal and  symmetric distal pulses are present in all extremities. No murmurs, rubs, or gallops. Respiratory: Normal respiratory effort without tachypnea nor retractions. Breath sounds are clear and equal bilaterally. No wheezes/rales/rhonchi. Gastrointestinal: The abdomen is distended and tympanic. It is tender in all 4 quadrants.  Musculoskeletal: Nontender with normal range of motion in all extremities. No joint effusions.  No lower extremity tenderness nor edema. Neurologic:  Normal speech and language. No gross focal neurologic deficits are appreciated. Speech is normal. No gait instability. Skin:  Skin is warm, dry and intact. No rash noted. Psychiatric: The patient appears frustrated, but Speech and behavior are normal. Patient exhibits  appropriate insight and judgment.  ____________________________________________    LABS (pertinent positives/negatives)  Note old full lab values: Potassium of 3.1 [place] blood cell count is normal at 7.1. Renal function is within normal levels.  ____________________________________________   ____________________________________________    [id number]  I have reviewed the plain x-rays of this patient's abdomen that were forwarded from urgent care. They are consistent with a bowel obstruction.  The CT scan is pending at the time of this dictation..  ____________________________________________   PROCEDURES   ____________________________________________   INITIAL IMPRESSION / ASSESSMENT AND PLAN / ED COURSE  Pertinent labs & imaging results that were available during my care of the patient were reviewed by me and considered in my medical decision making (see chart for details).  After seeing the patient, I called Dr. Leanora Cover who is on-call for Dr. Rolin Barry service. This was at 11:55 AM. Dr. Leanora Cover was in the operating room and will call as soon as he is available. I have ordered basic labs for this person's abdominal situation as well as a CT scan. This was order after reviewing the abdominal plain x-rays that were brought by the patient from urgent care. These x-rays showed a likely bowel obstruction with air-fluid levels. The patient is uncomfortable. We will treat him with dilaudid and with Zofran as well as 1 L of normal saline through an IV.  ----------------------------------------- 1:31 PM on 06/18/2014 -----------------------------------------  Recheck of patient: Patient appears more comfortable. He is tolerating the oral contrast for the abdominal pelvic CT that has been ordered.  ----------------------------------------- 2:00 PM on 06/18/2014 ----------------------------------------- I discussed the case with Dr. Leanora Cover by telephone at 1:50. He will see the  patient in the emergency department.  The CT scan is pending.    ____________________________________________   FINAL CLINICAL IMPRESSION(S) / ED DIAGNOSES  Final diagnoses:  Large bowel obstruction  Generalized abdominal pain     Ahmed Prima, MD 06/18/14 [phone]

## 2014-06-18 NOTE — H&P (Signed)
Samuel Hood. is an 64 y.o. male.   Chief Complaint: Vomiting HPI: S/P sigmoid colectomy mid-July 2015 for a benign polyp with H-G dysplasia. His last BM was Saturday (3 days ago) and was small. He last passed flatus Friday. Started vomiting Sunday PM (36 hours ago), without hematemesis. Generalized constant abdominal pain, worse in lower abdomen than upper abdomen. Denies fever, chills.  History reviewed. No pertinent past medical history.  Past Surgical History  Procedure Laterality Date  . Colon surgery     Family History: Both parents died in their 43s of CA (unknown types).  Social History:  reports that he has been smoking.  He does not have any smokeless tobacco history on file. He reports that he drinks alcohol. His drug history is not on file. 1/2 PPD. 1 drink of liquor and 2 beers / day.  Allergies: No Known Allergies  (Not in a hospital admission)  Results for orders placed or performed during the hospital encounter of 06/18/14 (from the past 48 hour(s))  CBC with Differential     Status: Abnormal   Collection Time: 06/18/14 11:42 AM  Result Value Ref Range   WBC 7.1 3.8 - 10.6 K/uL   RBC 5.36 4.40 - 5.90 MIL/uL   Hemoglobin 16.6 13.0 - 18.0 g/dL   HCT 49.6 40.0 - 52.0 %   MCV 92.7 80.0 - 100.0 fL   MCH 31.1 26.0 - 34.0 pg   MCHC 33.6 32.0 - 36.0 g/dL   RDW 15.3 (H) 11.5 - 14.5 %   Platelets 299 150 - 440 K/uL   Neutrophils Relative % 72% %   Neutro Abs 5.1 1.4 - 6.5 K/uL   Lymphocytes Relative 17% %   Lymphs Abs 1.2 1.0 - 3.6 K/uL   Monocytes Relative 10% %   Monocytes Absolute 0.7 0.2 - 1.0 K/uL   Eosinophils Relative 0% %   Eosinophils Absolute 0.0 0 - 0.7 K/uL   Basophils Relative 1% %   Basophils Absolute 0.0 0 - 0.1 K/uL  Comprehensive metabolic panel     Status: Abnormal   Collection Time: 06/18/14 11:42 AM  Result Value Ref Range   Sodium 141 135 - 145 mmol/L   Potassium 3.1 (L) 3.5 - 5.1 mmol/L   Chloride 98 (L) 101 - 111 mmol/L   CO2 32 22 - 32  mmol/L   Glucose, Bld 124 (H) 65 - 99 mg/dL   BUN 16 6 - 20 mg/dL   Creatinine, Ser 0.86 0.61 - 1.24 mg/dL   Calcium 9.5 8.9 - 10.3 mg/dL   Total Protein 8.4 (H) 6.5 - 8.1 g/dL   Albumin 3.7 3.5 - 5.0 g/dL   AST 59 (H) 15 - 41 U/L   ALT 44 17 - 63 U/L   Alkaline Phosphatase 63 38 - 126 U/L   Total Bilirubin 1.0 0.3 - 1.2 mg/dL   GFR calc non Af Amer >60 >60 mL/min   GFR calc Af Amer >60 >60 mL/min    Comment: (NOTE) The eGFR has been calculated using the CKD EPI equation. This calculation has not been validated in all clinical situations. eGFR's persistently <90 mL/min signify possible Chronic Kidney Disease.    Anion gap 11 5 - 15   No results found.  Review of Systems  Constitutional: Negative for fever, chills and malaise/fatigue.  HENT: Negative for sore throat and tinnitus.   Eyes: Negative for blurred vision, double vision and pain.  Respiratory: Negative for cough, sputum production and wheezing.  Cardiovascular: Negative for chest pain, palpitations, orthopnea and leg swelling.  Gastrointestinal: Positive for nausea, vomiting, abdominal pain and constipation. Negative for heartburn, diarrhea, blood in stool and melena.  Genitourinary: Negative for dysuria, urgency, frequency and hematuria.  Musculoskeletal: Negative for myalgias and falls.  Skin: Negative for itching and rash.  Neurological: Negative for dizziness, focal weakness and headaches.  Endo/Heme/Allergies: Does not bruise/bleed easily.  Psychiatric/Behavioral: Negative for depression. The patient does not have insomnia.     Blood pressure 149/100, temperature 98 F (36.7 C), temperature source Oral, height _0  (1.651 m), weight 68.04 kg (150 lb), SpO2 99 %. Physical Exam  Constitutional: He is oriented to person, place, and time. He appears well-developed and well-nourished. No distress.  HENT:  Head: Normocephalic.  Mouth/Throat: Oropharynx is clear and moist.  Eyes: Conjunctivae are normal. Pupils  are equal, round, and reactive to light. No scleral icterus.  Neck: Normal range of motion. Neck supple. No JVD present. No tracheal deviation present. No thyromegaly present.  Cardiovascular: Normal rate, regular rhythm and normal heart sounds.  Exam reveals no friction rub.   No murmur heard. Respiratory: Effort normal and breath sounds normal. He has no wheezes. He has no rales.  GI: Soft. Bowel sounds are normal. He exhibits distension. He exhibits no mass. There is no tenderness. There is no rebound and no guarding.  Musculoskeletal: Normal range of motion. He exhibits no edema or tenderness.  Lymphadenopathy:    He has no cervical adenopathy.  Neurological: He is alert and oriented to person, place, and time.  Skin: Skin is warm and dry. No rash noted. He is not diaphoretic. No erythema.  Psychiatric: He has a normal mood and affect.     Assessment/Plan Distal SBO.(according to CT, which has just been reported). P - IVF, analgesics, anti-emetics NG suction Re-evaluate in AM with repeat Hall Busing 06/18/2014, 2:42 PM

## 2014-06-18 NOTE — ED Notes (Signed)
On Sunday started vomiting, xray done this morning with possible bowel obs

## 2014-06-19 ENCOUNTER — Inpatient Hospital Stay: Payer: No Typology Code available for payment source

## 2014-06-19 ENCOUNTER — Encounter: Payer: Self-pay | Admitting: Internal Medicine

## 2014-06-19 DIAGNOSIS — I10 Essential (primary) hypertension: Secondary | ICD-10-CM

## 2014-06-19 DIAGNOSIS — E876 Hypokalemia: Secondary | ICD-10-CM

## 2014-06-19 LAB — URINALYSIS COMPLETE WITH MICROSCOPIC (ARMC ONLY)
Bacteria, UA: NONE SEEN
Bilirubin Urine: NEGATIVE
Glucose, UA: NEGATIVE mg/dL
Ketones, ur: NEGATIVE mg/dL
Leukocytes, UA: NEGATIVE
Nitrite: NEGATIVE
Protein, ur: 30 mg/dL — AB
Specific Gravity, Urine: 1.039 — ABNORMAL HIGH (ref 1.005–1.030)
Squamous Epithelial / LPF: NONE SEEN
pH: 6 (ref 5.0–8.0)

## 2014-06-19 LAB — CBC
HCT: 50.2 % (ref 40.0–52.0)
HCT: 48.1 % (ref 40.0–52.0)
Hemoglobin: 16.7 g/dL (ref 13.0–18.0)
Hemoglobin: 15.9 g/dL (ref 13.0–18.0)
MCH: 31.3 pg (ref 26.0–34.0)
MCH: 31 pg (ref 26.0–34.0)
MCHC: 33.3 g/dL (ref 32.0–36.0)
MCHC: 33 g/dL (ref 32.0–36.0)
MCV: 93.8 fL (ref 80.0–100.0)
MCV: 94.1 fL (ref 80.0–100.0)
Platelets: 307 10*3/uL (ref 150–440)
Platelets: 285 10*3/uL (ref 150–440)
RBC: 5.35 MIL/uL (ref 4.40–5.90)
RBC: 5.11 MIL/uL (ref 4.40–5.90)
RDW: 15.3 % — ABNORMAL HIGH (ref 11.5–14.5)
RDW: 15.4 % — ABNORMAL HIGH (ref 11.5–14.5)
WBC: 6.8 10*3/uL (ref 3.8–10.6)
WBC: 6.5 10*3/uL (ref 3.8–10.6)

## 2014-06-19 LAB — MAGNESIUM
Magnesium: 2.1 mg/dL (ref 1.7–2.4)
Magnesium: 2.1 mg/dL (ref 1.7–2.4)

## 2014-06-19 LAB — APTT
aPTT: 34 seconds (ref 24–36)
aPTT: 31 seconds (ref 24–36)

## 2014-06-19 LAB — PHOSPHORUS
Phosphorus: 3.3 mg/dL (ref 2.5–4.6)
Phosphorus: 2.9 mg/dL (ref 2.5–4.6)

## 2014-06-19 LAB — COMPREHENSIVE METABOLIC PANEL
ALT: 42 U/L (ref 17–63)
AST: 49 U/L — ABNORMAL HIGH (ref 15–41)
Albumin: 3.6 g/dL (ref 3.5–5.0)
Alkaline Phosphatase: 56 U/L (ref 38–126)
Anion gap: 6 (ref 5–15)
BUN: 12 mg/dL (ref 6–20)
CO2: 32 mmol/L (ref 22–32)
Calcium: 9.1 mg/dL (ref 8.9–10.3)
Chloride: 106 mmol/L (ref 101–111)
Creatinine, Ser: 0.87 mg/dL (ref 0.61–1.24)
GFR calc Af Amer: >60 mL/min (ref >60)
GFR calc non Af Amer: >60 mL/min (ref >60)
Glucose, Bld: 125 mg/dL — ABNORMAL HIGH (ref 65–99)
Potassium: 3.6 mmol/L (ref 3.5–5.1)
Sodium: 144 mmol/L (ref 135–145)
Total Bilirubin: 1 mg/dL (ref 0.3–1.2)
Total Protein: 7.5 g/dL (ref 6.5–8.1)

## 2014-06-19 LAB — PROTIME-INR
INR: 0.94
INR: 0.93
Prothrombin Time: 12.8 seconds (ref 11.4–15.0)
Prothrombin Time: 12.7 seconds (ref 11.4–15.0)

## 2014-06-19 MED ORDER — HYDRALAZINE HCL 20 MG/ML IJ SOLN
5.0000 mg | Freq: Four times a day (QID) | INTRAMUSCULAR | Status: DC | PRN
  Administered 2014-06-19 – 2014-06-20 (×3): 5 mg via INTRAVENOUS
  Filled 2014-06-19 (×3): qty 1

## 2014-06-19 MED ORDER — MORPHINE SULFATE 2 MG/ML IJ SOLN: 2.0000 mg | INTRAMUSCULAR | Status: DC | PRN

## 2014-06-19 MED ORDER — KCL IN DEXTROSE-NACL 20-5-0.45 MEQ/L-%-% IV SOLN
INTRAVENOUS | Status: DC
  Filled 2014-06-19 (×4): qty 1000

## 2014-06-19 NOTE — Consult Note (Signed)
[facility] NAME: Samuel Hood    MR#:  [id number]  DATE OF BIRTH:  04-Feb-1951  DATE OF ADMISSION:  06/18/2014  PRIMARY CARE PHYSICIAN: Nathaneil Canary, PA-C   REQUESTING/REFERRING PHYSICIAN: Dr Leanora Cover CHIEF COMPLAINT:   Chief Complaint  Patient presents with  . Abdominal Pain    HISTORY OF PRESENT ILLNESS:  Samuel Hood  is a 64 y.o. male with a known history of hypertension who presented to the ED on 06/18/14 with abdominal pain. He was found to have a distal SBO on CT of the abdomen. NG tube in place to suction, he is taking ice chips and abdominal pin has improved somewhat since admission.  Blood pressures have been elevated during the hospitalization up to the 160's/100's.  He usually takes HCTZ at home and does not know his usual blood pressures. No chest pain, shortness of breath, vision change, headache or edema.  PAST MEDICAL HISTORY:   Past Medical History  Diagnosis Date  . Hypertension     PAST SURGICAL HISTORY:   Past Surgical History  Procedure Laterality Date  . Colon surgery      SOCIAL HISTORY:   History  Substance Use Topics  . Smoking status: Current Every Day Smoker  . Smokeless tobacco: Not on file  . Alcohol Use: Yes    FAMILY HISTORY:   Family History  Problem Relation Age of Onset  . Cancer Mother   . Cancer Father     DRUG ALLERGIES:  No Known Allergies  REVIEW OF SYSTEMS:  CONSTITUTIONAL: No fever, fatigue or weakness.  EYES: No blurred or double vision.  EARS, NOSE, AND THROAT: No tinnitus or ear pain.  RESPIRATORY: No cough, shortness of breath, wheezing or hemoptysis.  CARDIOVASCULAR: No chest pain, orthopnea, edema.  GASTROINTESTINAL: + abdominal pain- improving, distension, lack of BM GENITOURINARY: No dysuria, hematuria.  ENDOCRINE: No polyuria, nocturia,  HEMATOLOGY: No anemia, easy bruising or bleeding SKIN: No rash or lesion. MUSCULOSKELETAL: No joint pain or  arthritis.   NEUROLOGIC: No tingling, numbness, weakness.  PSYCHIATRY: No anxiety or depression.   MEDICATIONS AT HOME:   Prior to Admission medications   Medication Sig Start Date End Date Taking? Authorizing Provider  aspirin 325 MG tablet Take 325 mg by mouth every 6 (six) hours as needed for mild pain.   Yes Historical Provider, MD  hydrochlorothiazide (HYDRODIURIL) 25 MG tablet Take 25 mg by mouth daily.   Yes Historical Provider, MD  ibuprofen (ADVIL,MOTRIN) 100 MG tablet Take 200 mg by mouth every 6 (six) hours as needed for pain or fever.   Yes Historical Provider, MD      VITAL SIGNS:  Blood pressure 155/104, pulse 109, temperature 97.6 F (36.4 C), temperature source Oral, resp. rate 18, height '5\' 5"'$  (1.651 m), weight 62.625 kg (138 lb 1 oz), SpO2 96 %.  PHYSICAL EXAMINATION:  GENERAL:  64 y.o.-year-old patient lying in the bed with no acute distress.  EYES: Pupils equal, round, reactive to light and accommodation. No scleral icterus. Extraocular muscles intact.  HEENT: Head atraumatic, normocephalic. Oropharynx and nasopharynx clear.  NECK:  Supple, no jugular venous distention. No thyroid enlargement, no tenderness.  LUNGS: Normal breath sounds bilaterally, no wheezing, rales,rhonchi or crepitation. No use of accessory muscles of respiration.  CARDIOVASCULAR: S1, S2 normal. No murmurs, rubs, or gallops.  ABDOMEN: distended. Bowel sounds decreased by present, mild tenderness worse in th RLQ  EXTREMITIES: No pedal edema, cyanosis, or clubbing.  NEUROLOGIC: Cranial nerves II through XII are intact. Muscle strength 5/5 in all extremities. Sensation intact. Gait not checked.  PSYCHIATRIC: The patient is alert and oriented x 3. Anxious, frustrated SKIN: No obvious rash, lesion, or ulcer.   LABORATORY PANEL:   CBC  Recent Labs Lab 06/19/14 0525  WBC 6.8  HGB 16.7  HCT 50.2  PLT 307    ------------------------------------------------------------------------------------------------------------------  Chemistries   Recent Labs Lab 06/18/14 1142 06/19/14 0525  NA 141  --   K 3.1*  --   CL 98*  --   CO2 32  --   GLUCOSE 124*  --   BUN 16  --   CREATININE 0.86  --   CALCIUM 9.5  --   MG  --  2.1  AST 59*  --   ALT 44  --   ALKPHOS 63  --   BILITOT 1.0  --    ------------------------------------------------------------------------------------------------------------------  Cardiac Enzymes No results for input(s): TROPONINI in the last 168 hours. ------------------------------------------------------------------------------------------------------------------  RADIOLOGY:  Ct Abdomen Pelvis W Contrast  06/18/2014   CLINICAL DATA:  Two day history of vomiting with abdominal pain and distention  EXAM: CT ABDOMEN AND PELVIS WITH CONTRAST  TECHNIQUE: Multidetector CT imaging of the abdomen and pelvis was performed using the standard protocol following bolus administration of intravenous contrast. Oral contrast was also administered.  CONTRAST:  153m OMNIPAQUE IOHEXOL 300 MG/ML  SOLN  COMPARISON:  September 13, 2013 CT abdomen and pelvis  FINDINGS: Lung bases are clear.  No focal liver lesions are identified. Gallbladder wall is not appreciably thickened. There is no biliary duct dilatation.  Spleen appears normal. There is slight prominence of the pancreatic duct, stable. No pancreatic mass or inflammatory focus is seen.  Adrenals appear normal. There is a cyst arising from the lateral midportion right kidney measuring 1.9 x 1.9 cm. There is a 0.9 x 0.8 cm cyst arising from the posterior mid right kidney. There is no hydronephrosis on either side. There is no renal or ureteral calculus on either side.  In the pelvis, the urinary bladder is midline with normal wall thickness. There is a small amount of free fluid in the pelvis. There is no pelvic mass.  There is dilatation of small  bowel to the level of the proximal ileum or there is a transition zone consistent with bowel obstruction. There is a somewhat swirled appearance in this area suggesting that there may be an internal hernia in the mid abdomen slightly anterior to the proximal iliac artery slightly inferior to the aortic bifurcation. There is no free air or portal venous air.  There is no adenopathy or abscess in the an or pelvis. There is atherosclerotic change in the aorta and iliac arteries. There are no blastic or lytic bone lesions. There is extensive degenerative type change in the lumbar spine.  IMPRESSION: Bowel obstruction at the level of the proximal ileum. Suspect internal hernia slightly anterior to the proximal iliac arteries in the mid abdominal region. Vessels and mesenteric have a somewhat swirled appearance in this area. There is evidence of previous surgery in the large bowel in the lower abdomen with clips present. No obstruction noted in the postoperative area.  No free air.  No abscess.  There is mild ascites.  These results were called by telephone at the time of interpretation on 06/18/2014 at 2:43 pm to Dr. DFrancene Castle, who verbally acknowledged these results.   Electronically Signed   By: WLowella GripIII M.D.  On: 06/18/2014 14:44   Dg Abd 2 Views  06/19/2014   CLINICAL DATA:  Follow-up of bowel obstruction; persistent pain and nausea  EXAM: ABDOMEN - 2 VIEW  COMPARISON:  Abdominal series of Jun 18, 2014  FINDINGS: The esophagogastric tube has been advanced a small distance but the proximal port remains above the level of the GE junction. Advancement by an additional 10-15 cm is recommended. There remain loops of moderately distended and fluid-filled small bowel in the mid and left upper abdomen. There is small amount of colonic gas and stool. No free extraluminal gas collections are demonstrated. The lung bases are clear.  IMPRESSION: 1. Advancement of the nasogastric tube by 10-15 cm is needed to  assure that the proximal port is below the GE junction. 2. Persistent dilated small bowel loops consistent with a mid to distal small bowel obstruction. 3. These results will be called to the ordering clinician or representative by the Radiologist Assistant, and communication documented in the PACS or zVision Dashboard.   Electronically Signed   By: David  Martinique M.D.   On: 06/19/2014 10:33   Dg Abd Portable 2v  06/18/2014   CLINICAL DATA:  Small bowel obstruction  EXAM: PORTABLE ABDOMEN - 2 VIEW  COMPARISON:  Portable exam 1751 hr compared to CT exam of 06/18/2014  FINDINGS: Lung bases clear.  Tip of nasogastric tube at gastroesophageal junction; recommend advancing tube 8 cm to place proximal side-port within stomach.  Lung bases clear.  Numerous dilated small bowel loops consistent with small bowel obstruction.  No definite bowel wall thickening or free intraperitoneal air.  Excreted contrast material within renal collecting systems and bladder.  Bones demineralized degenerative disc disease changes of the lumbar spine.  IMPRESSION: Small bowel obstruction.  Recommend advancing nasogastric tube 8-10 cm to position proximal side-port within stomach.  Findings called to Zaneta RN on 06/18/2014 at 1805 hr.   Electronically Signed   By: Lavonia Dana M.D.   On: 06/18/2014 18:08    EKG:  No orders found for this or any previous visit.  IMPRESSION AND PLAN:  Active Problems:   Bowel obstruction   Essential hypertension   Hypokalemia   1) SBO - per surgical team, NG to suction  2) hypertension - no PO meds at this time - BP not too elevated, but patient very concerned about it. Will start low dose IV hydralazine for SBP >160  3) hypokalemia - recheck and replace as needed esp in setting of GI losses   All the records are reviewed and case discussed with Dr Leanora Cover Management plans discussed with the patient, family and they are in agreement.  CODE STATUS: FULL TOTAL TIME TAKING CARE OF THIS  PATIENT: 30 minutes.    Myrtis Ser M.D on 06/19/2014 at 1:05 PM  Between 7am to 6pm - Pager - [phone]  After 6pm go to [email or web address] - password [email or web address] [facility]  Office  [phone]  CC: Primary care physician; Nathaneil Canary, PA-C

## 2014-06-19 NOTE — Progress Notes (Signed)
Dr Marina Gravel notified of increased bp. No ordered given.

## 2014-06-19 NOTE — Progress Notes (Signed)
MD notified about elevated BP. Rn received orders to obtain IM consult. Dr. Marthann Schiller notified about pt. Verbally and notified about elevated BP. Pt. Is very upset and yelling at staff saying "we dont ever see him" we and staff has been in room more than every hour, Pt. Also yelling that "we are going to kill him and he cant wait to sue [place]". Pt was educated about his BP and what we are doing to address it.

## 2014-06-19 NOTE — Progress Notes (Signed)
RN Notified ° °

## 2014-06-19 NOTE — Progress Notes (Signed)
Subjective: Patient's stomach feels better  He denies flatus He is simultaneously very upset that nothing has been done for him and he is adamantly refusing surgery  Vital signs in last 24 hours: Temp:  [97.6 F (36.4 C)-98.2 F (36.8 C)] 97.6 F (36.4 C) (05/04 1141) Pulse Rate:  [82-109] 109 (05/04 1141) Resp:  [14-20] 18 (05/04 1141) BP: (140-165)/(95-135) 155/104 mmHg (05/04 1214) SpO2:  [96 %-98 %] 96 % (05/04 1141) Weight:  [62.625 kg (138 lb 1 oz)] 62.625 kg (138 lb 1 oz) (05/03 1818)    Intake/Output from previous day: 05/03 0701 - 05/04 0700 In: 60  Out: 525 [Urine:525]  PERTINENT EXAM: Abdomen NT  Lab Results:  CBC  Recent Labs  06/18/14 1142 06/19/14 0525  WBC 7.1 6.8  HGB 16.6 16.7  HCT 49.6 50.2  PLT 299 307   CMP     Component Value Date/Time   NA 141 06/18/2014 1142   NA 139 09/13/2013 0408   K 3.1* 06/18/2014 1142   K 3.3* 09/13/2013 0408   CL 98* 06/18/2014 1142   CL 105 09/13/2013 0408   CO2 32 06/18/2014 1142   CO2 27 09/13/2013 0408   GLUCOSE 124* 06/18/2014 1142   GLUCOSE 108* 09/13/2013 0408   BUN 16 06/18/2014 1142   BUN 4* 09/13/2013 0408   CREATININE 0.86 06/18/2014 1142   CREATININE 0.67 09/13/2013 0408   CALCIUM 9.5 06/18/2014 1142   CALCIUM 8.1* 09/13/2013 0408   PROT 8.4* 06/18/2014 1142   PROT 6.2* 09/13/2013 0408   ALBUMIN 3.7 06/18/2014 1142   ALBUMIN 1.9* 09/13/2013 0408   AST 59* 06/18/2014 1142   AST 30 09/13/2013 0408   ALT 44 06/18/2014 1142   ALT 27 09/13/2013 0408   ALKPHOS 63 06/18/2014 1142   ALKPHOS 218* 09/13/2013 0408   BILITOT 1.0 06/18/2014 1142   GFRNONAA >60 06/18/2014 1142   GFRNONAA >60 09/13/2013 0408   GFRAA >60 06/18/2014 1142   GFRAA >60 09/13/2013 0408   PT/INR  Recent Labs  06/19/14 0525  LABPROT 12.8  INR 0.94    Studies/Results: Ct Abdomen Pelvis W Contrast  06/18/2014   CLINICAL DATA:  Two day history of vomiting with abdominal pain and distention  EXAM: CT ABDOMEN AND PELVIS  WITH CONTRAST  TECHNIQUE: Multidetector CT imaging of the abdomen and pelvis was performed using the standard protocol following bolus administration of intravenous contrast. Oral contrast was also administered.  CONTRAST:  153m OMNIPAQUE IOHEXOL 300 MG/ML  SOLN  COMPARISON:  September 13, 2013 CT abdomen and pelvis  FINDINGS: Lung bases are clear.  No focal liver lesions are identified. Gallbladder wall is not appreciably thickened. There is no biliary duct dilatation.  Spleen appears normal. There is slight prominence of the pancreatic duct, stable. No pancreatic mass or inflammatory focus is seen.  Adrenals appear normal. There is a cyst arising from the lateral midportion right kidney measuring 1.9 x 1.9 cm. There is a 0.9 x 0.8 cm cyst arising from the posterior mid right kidney. There is no hydronephrosis on either side. There is no renal or ureteral calculus on either side.  In the pelvis, the urinary bladder is midline with normal wall thickness. There is a small amount of free fluid in the pelvis. There is no pelvic mass.  There is dilatation of small bowel to the level of the proximal ileum or there is a transition zone consistent with bowel obstruction. There is a somewhat swirled appearance in this area suggesting that  there may be an internal hernia in the mid abdomen slightly anterior to the proximal iliac artery slightly inferior to the aortic bifurcation. There is no free air or portal venous air.  There is no adenopathy or abscess in the an or pelvis. There is atherosclerotic change in the aorta and iliac arteries. There are no blastic or lytic bone lesions. There is extensive degenerative type change in the lumbar spine.  IMPRESSION: Bowel obstruction at the level of the proximal ileum. Suspect internal hernia slightly anterior to the proximal iliac arteries in the mid abdominal region. Vessels and mesenteric have a somewhat swirled appearance in this area. There is evidence of previous surgery in the  large bowel in the lower abdomen with clips present. No obstruction noted in the postoperative area.  No free air.  No abscess.  There is mild ascites.  These results were called by telephone at the time of interpretation on 06/18/2014 at 2:43 pm to Dr. Francene Castle , who verbally acknowledged these results.   Electronically Signed   By: Lowella Grip III M.D.   On: 06/18/2014 14:44   Dg Abd 2 Views  06/19/2014   CLINICAL DATA:  Follow-up of bowel obstruction; persistent pain and nausea  EXAM: ABDOMEN - 2 VIEW  COMPARISON:  Abdominal series of Jun 18, 2014  FINDINGS: The esophagogastric tube has been advanced a small distance but the proximal port remains above the level of the GE junction. Advancement by an additional 10-15 cm is recommended. There remain loops of moderately distended and fluid-filled small bowel in the mid and left upper abdomen. There is small amount of colonic gas and stool. No free extraluminal gas collections are demonstrated. The lung bases are clear.  IMPRESSION: 1. Advancement of the nasogastric tube by 10-15 cm is needed to assure that the proximal port is below the GE junction. 2. Persistent dilated small bowel loops consistent with a mid to distal small bowel obstruction. 3. These results will be called to the ordering clinician or representative by the Radiologist Assistant, and communication documented in the PACS or zVision Dashboard.   Electronically Signed   By: David  Martinique M.D.   On: 06/19/2014 10:33   Dg Abd Portable 2v  06/18/2014   CLINICAL DATA:  Small bowel obstruction  EXAM: PORTABLE ABDOMEN - 2 VIEW  COMPARISON:  Portable exam 1751 hr compared to CT exam of 06/18/2014  FINDINGS: Lung bases clear.  Tip of nasogastric tube at gastroesophageal junction; recommend advancing tube 8 cm to place proximal side-port within stomach.  Lung bases clear.  Numerous dilated small bowel loops consistent with small bowel obstruction.  No definite bowel wall thickening or free  intraperitoneal air.  Excreted contrast material within renal collecting systems and bladder.  Bones demineralized degenerative disc disease changes of the lumbar spine.  IMPRESSION: Small bowel obstruction.  Recommend advancing nasogastric tube 8-10 cm to position proximal side-port within stomach.  Findings called to Zaneta RN on 06/18/2014 at 1805 hr.   Electronically Signed   By: Lavonia Dana M.D.   On: 06/18/2014 18:08    Assessment/Plan: Minimal objective improvement (although he has no fever, a nl WBC, and minimal NG output, so he has no absolute indications for surgery today) P - advance NG Repeat labs films in AM IM consult for HTN

## 2014-06-20 ENCOUNTER — Inpatient Hospital Stay: Payer: No Typology Code available for payment source

## 2014-06-20 MED ORDER — METOPROLOL TARTRATE 1 MG/ML IV SOLN
5.0000 mg | Freq: Two times a day (BID) | INTRAVENOUS | Status: DC
  Administered 2014-06-20 – 2014-06-21 (×3): 5 mg via INTRAVENOUS
  Filled 2014-06-20 (×3): qty 5

## 2014-06-20 NOTE — Progress Notes (Signed)
Subjective: The patient wishes to go home tomorrow. Although he has had some flatus, he has not had a bowel movement. He says his stomach feels much better, however.  Vital signs in last 24 hours: Temp:  [97.8 F (36.6 C)-98.9 F (37.2 C)] 98.7 F (37.1 C) 07/01/2022 1613) Pulse Rate:  [71-82] 75 Jul 01, 2022 1613) Resp:  [12-19] 17 07/01/2022 1613) BP: (140-190)/(88-109) 190/98 mmHg Jul 01, 2022 1717) SpO2:  [98 %-99 %] 99 % Jul 01, 2022 1613)    Intake/Output from previous day: 05/04 0701 - 01-Jul-2022 0700 In: 4317 [I.M.:5465; NG/GT:120] Out: 980 [Urine:400; Emesis/NG output:580]  GI: soft, non-tender; bowel sounds normal; no masses,  no organomegaly  Lab Results:  CBC  Recent Labs  06/19/14 0525 06/19/14 1305  WBC 6.8 6.5  HGB 16.7 15.9  HCT 50.2 48.1  PLT 307 285   CMP     Component Value Date/Time   NA 144 06/19/2014 1305   NA 139 09/13/2013 0408   K 3.6 06/19/2014 1305   K 3.3* 09/13/2013 0408   CL 106 06/19/2014 1305   CL 105 09/13/2013 0408   CO2 32 06/19/2014 1305   CO2 27 09/13/2013 0408   GLUCOSE 125* 06/19/2014 1305   GLUCOSE 108* 09/13/2013 0408   BUN 12 06/19/2014 1305   BUN 4* 09/13/2013 0408   CREATININE 0.87 06/19/2014 1305   CREATININE 0.67 09/13/2013 0408   CALCIUM 9.1 06/19/2014 1305   CALCIUM 8.1* 09/13/2013 0408   PROT 7.5 06/19/2014 1305   PROT 6.2* 09/13/2013 0408   ALBUMIN 3.6 06/19/2014 1305   ALBUMIN 1.9* 09/13/2013 0408   AST 49* 06/19/2014 1305   AST 30 09/13/2013 0408   ALT 42 06/19/2014 1305   ALT 27 09/13/2013 0408   ALKPHOS 56 06/19/2014 1305   ALKPHOS 218* 09/13/2013 0408   BILITOT 1.0 06/19/2014 1305   GFRNONAA >60 06/19/2014 1305   GFRNONAA >60 09/13/2013 0408   GFRAA >60 06/19/2014 1305   GFRAA >60 09/13/2013 0408   PT/INR  Recent Labs  06/19/14 0525 06/19/14 1305  LABPROT 12.8 12.7  INR 0.94 0.93    Studies/Results: Dg Abd 2 Views  01-Jul-2014   CLINICAL DATA:  Followup small bowel obstruction.  EXAM: ABDOMEN - 2 VIEW   COMPARISON:  06/19/2014  FINDINGS: There are dilated loops small bowel with air-fluid levels on the upright view. The degree of small bowel dilation has improved from the previous day's study. There is some residual contrast in a nondistended colon.  No free air.  Nasogastric tube is well positioned with its tip in the distal stomach, advanced from the previous day's study.  Single vascular clip is noted in the left pelvis. Soft tissues are otherwise unremarkable.  IMPRESSION: 1. Mild improvement from the previous day's study with less small bowel dilation. 2. Although improved, there still small bowel dilation with air-fluid levels consistent with residual partial small bowel obstruction. 3. No free air. 4. Nasogastric tube is well positioned.   Electronically Signed   By: Lajean Manes M.D.   On: 01-Jul-2014 10:09   Dg Abd 2 Views  06/19/2014   CLINICAL DATA:  Follow-up of bowel obstruction; persistent pain and nausea  EXAM: ABDOMEN - 2 VIEW  COMPARISON:  Abdominal series of Jun 18, 2014  FINDINGS: The esophagogastric tube has been advanced a small distance but the proximal port remains above the level of the GE junction. Advancement by an additional 10-15 cm is recommended. There remain loops of moderately distended and fluid-filled small bowel in the  mid and left upper abdomen. There is small amount of colonic gas and stool. No free extraluminal gas collections are demonstrated. The lung bases are clear.  IMPRESSION: 1. Advancement of the nasogastric tube by 10-15 cm is needed to assure that the proximal port is below the GE junction. 2. Persistent dilated small bowel loops consistent with a mid to distal small bowel obstruction. 3. These results will be called to the ordering clinician or representative by the Radiologist Assistant, and communication documented in the PACS or zVision Dashboard.   Electronically Signed   By: David  Martinique M.D.   On: 06/19/2014 10:33    Assessment/Plan: The contrast from his  CT scan has reached his colon. He still has some small bowel dilation on x-ray but it is significantly improved. I encouraged him to continue the nasogastric suction overnight, and told him when I removed his NG tube in the morning that he would remain nothing by mouth until he had more significant improvement.

## 2014-06-20 NOTE — Progress Notes (Signed)
[facility] [place] NAME: Samuel Hood    MR#:  [id number]  DATE OF BIRTH:  Mar 31, 1950  SUBJECTIVE:  CHIEF COMPLAINT:   Chief Complaint  Patient presents with  . Abdominal Pain   Feels better today, still has NG tube with drainage but decreased secretions. KUB today, BP elevated. REVIEW OF SYSTEMS:   CONSTITUTIONAL: No fever, fatigue or weakness.  EYES: No blurred or double vision.  EARS, NOSE, AND THROAT: No tinnitus or ear pain.  RESPIRATORY: No cough, shortness of breath, wheezing or hemoptysis.  CARDIOVASCULAR: No chest pain, orthopnea, edema.  GASTROINTESTINAL: Positive for abd pain, nausea/vomiting./.  GENITOURINARY: No dysuria, hematuria.  HEMATOLOGY: No anemia, easy bruising or bleeding SKIN: No rash or lesion. MUSCULOSKELETAL: No joint pain or arthritis.   NEUROLOGIC: No tingling, numbness, weakness.  PSYCHIATRY: No anxiety or depression.   DRUG ALLERGIES:  No Known Allergies  VITALS:  Blood pressure 155/88, pulse 81, temperature 98.5 F (36.9 C), temperature source Oral, resp. rate 17, height '5\' 5"'$  (1.651 m), weight 62.625 kg (138 lb 1 oz), SpO2 99 %.  PHYSICAL EXAMINATION:   GENERAL:  64 y.o.-year-old patient lying in the bed with no acute distress.  EYES: Pupils equal, round, reactive to light and accommodation. No scleral icterus. Extraocular muscles intact.  HEENT: Head atraumatic, normocephalic. Oropharynx and nasopharynx clear.  NECK:  Supple, no jugular venous distention. No thyroid enlargement, no tenderness.  LUNGS: Normal breath sounds bilaterally, no wheezing, rales,rhonchi or crepitation. No use of accessory muscles of respiration.  CARDIOVASCULAR: S1, S2 normal. No murmurs, rubs, or gallops.  ABDOMEN: Minimal abd discomfort on palpation, mild distention, no tenderness, guarding or rigidity. Hypoactive Bowel sounds present. No organomegaly or mass.  EXTREMITIES: No pedal edema, cyanosis, or clubbing.   NEUROLOGIC: Cranial nerves II through XII are intact. Muscle strength 5/5 in all extremities. Sensation intact. Gait not checked.  PSYCHIATRIC: The patient is alert and oriented x 3.  SKIN: No obvious rash, lesion, or ulcer.    LABORATORY PANEL:   CBC  Recent Labs Lab 06/19/14 1305  WBC 6.5  HGB 15.9  HCT 48.1  PLT 285   ------------------------------------------------------------------------------------------------------------------  Chemistries   Recent Labs Lab 06/19/14 1305  NA 144  K 3.6  CL 106  CO2 32  GLUCOSE 125*  BUN 12  CREATININE 0.87  CALCIUM 9.1  MG 2.1  AST 49*  ALT 42  ALKPHOS 56  BILITOT 1.0   ------------------------------------------------------------------------------------------------------------------  Cardiac Enzymes No results for input(s): TROPONINI in the last 168 hours. ------------------------------------------------------------------------------------------------------------------  RADIOLOGY:  Ct Abdomen Pelvis W Contrast  06/18/2014   CLINICAL DATA:  Two day history of vomiting with abdominal pain and distention  EXAM: CT ABDOMEN AND PELVIS WITH CONTRAST  TECHNIQUE: Multidetector CT imaging of the abdomen and pelvis was performed using the standard protocol following bolus administration of intravenous contrast. Oral contrast was also administered.  CONTRAST:  149m OMNIPAQUE IOHEXOL 300 MG/ML  SOLN  COMPARISON:  September 13, 2013 CT abdomen and pelvis  FINDINGS: Lung bases are clear.  No focal liver lesions are identified. Gallbladder wall is not appreciably thickened. There is no biliary duct dilatation.  Spleen appears normal. There is slight prominence of the pancreatic duct, stable. No pancreatic mass or inflammatory focus is seen.  Adrenals appear normal. There is a cyst arising from the lateral midportion right kidney measuring 1.9 x 1.9 cm. There is a 0.9 x 0.8 cm cyst arising from the posterior mid right  kidney. There is no  hydronephrosis on either side. There is no renal or ureteral calculus on either side.  In the pelvis, the urinary bladder is midline with normal wall thickness. There is a small amount of free fluid in the pelvis. There is no pelvic mass.  There is dilatation of small bowel to the level of the proximal ileum or there is a transition zone consistent with bowel obstruction. There is a somewhat swirled appearance in this area suggesting that there may be an internal hernia in the mid abdomen slightly anterior to the proximal iliac artery slightly inferior to the aortic bifurcation. There is no free air or portal venous air.  There is no adenopathy or abscess in the an or pelvis. There is atherosclerotic change in the aorta and iliac arteries. There are no blastic or lytic bone lesions. There is extensive degenerative type change in the lumbar spine.  IMPRESSION: Bowel obstruction at the level of the proximal ileum. Suspect internal hernia slightly anterior to the proximal iliac arteries in the mid abdominal region. Vessels and mesenteric have a somewhat swirled appearance in this area. There is evidence of previous surgery in the large bowel in the lower abdomen with clips present. No obstruction noted in the postoperative area.  No free air.  No abscess.  There is mild ascites.  These results were called by telephone at the time of interpretation on 06/18/2014 at 2:43 pm to Dr. Francene Castle , who verbally acknowledged these results.   Electronically Signed   By: Lowella Grip III M.D.   On: 06/18/2014 14:44   Dg Abd 2 Views  06/20/2014   CLINICAL DATA:  Followup small bowel obstruction.  EXAM: ABDOMEN - 2 VIEW  COMPARISON:  06/19/2014  FINDINGS: There are dilated loops small bowel with air-fluid levels on the upright view. The degree of small bowel dilation has improved from the previous day's study. There is some residual contrast in a nondistended colon.  No free air.  Nasogastric tube is well positioned with  its tip in the distal stomach, advanced from the previous day's study.  Single vascular clip is noted in the left pelvis. Soft tissues are otherwise unremarkable.  IMPRESSION: 1. Mild improvement from the previous day's study with less small bowel dilation. 2. Although improved, there still small bowel dilation with air-fluid levels consistent with residual partial small bowel obstruction. 3. No free air. 4. Nasogastric tube is well positioned.   Electronically Signed   By: Lajean Manes M.D.   On: 06/20/2014 10:09   Dg Abd 2 Views  06/19/2014   CLINICAL DATA:  Follow-up of bowel obstruction; persistent pain and nausea  EXAM: ABDOMEN - 2 VIEW  COMPARISON:  Abdominal series of Jun 18, 2014  FINDINGS: The esophagogastric tube has been advanced a small distance but the proximal port remains above the level of the GE junction. Advancement by an additional 10-15 cm is recommended. There remain loops of moderately distended and fluid-filled small bowel in the mid and left upper abdomen. There is small amount of colonic gas and stool. No free extraluminal gas collections are demonstrated. The lung bases are clear.  IMPRESSION: 1. Advancement of the nasogastric tube by 10-15 cm is needed to assure that the proximal port is below the GE junction. 2. Persistent dilated small bowel loops consistent with a mid to distal small bowel obstruction. 3. These results will be called to the ordering clinician or representative by the Radiologist Assistant, and communication documented in the PACS  or zVision Dashboard.   Electronically Signed   By: David  Martinique M.D.   On: 06/19/2014 10:33   Dg Abd Portable 2v  06/18/2014   CLINICAL DATA:  Small bowel obstruction  EXAM: PORTABLE ABDOMEN - 2 VIEW  COMPARISON:  Portable exam 1751 hr compared to CT exam of 06/18/2014  FINDINGS: Lung bases clear.  Tip of nasogastric tube at gastroesophageal junction; recommend advancing tube 8 cm to place proximal side-port within stomach.  Lung bases  clear.  Numerous dilated small bowel loops consistent with small bowel obstruction.  No definite bowel wall thickening or free intraperitoneal air.  Excreted contrast material within renal collecting systems and bladder.  Bones demineralized degenerative disc disease changes of the lumbar spine.  IMPRESSION: Small bowel obstruction.  Recommend advancing nasogastric tube 8-10 cm to position proximal side-port within stomach.  Findings called to Zaneta RN on 06/18/2014 at 1805 hr.   Electronically Signed   By: Lavonia Dana M.D.   On: 06/18/2014 18:08    EKG:  No orders found for this or any previous visit.  ASSESSMENT AND PLAN:   Active Problems:  Bowel obstruction  Essential hypertension  Hypokalemia   1) SBO - NPO and a NG tube in - KUB with improvement, remains NPO for now. - Mgmt per surgical team  2) Hypertension - on IV hydralazine for SBP >160 - start IV metoprolol for today and once able to take PO- change to PO HCTZ  3) hypokalemia -  Replace as needed esp in setting of GI losses   All the records are reviewed and case discussed with Care Management/Social Workerr. Management plans discussed with the patient, family and they are in agreement.  CODE STATUS: FULL CODE  TOTAL TIME TAKING CARE OF THIS PATIENT: 35 minutes.   POSSIBLE D/C IN 2 DAYS, DEPENDING ON CLINICAL CONDITION.   Miangel Flom M.D on 06/20/2014 at 1:30 PM  Between 7am to 6pm - Pager - [phone]  After 6pm go to [email or web address] - password [email or web address] [facility]  Office  [phone]  CC: Primary care physician; Nathaneil Canary, PA-C

## 2014-06-21 LAB — BASIC METABOLIC PANEL
Anion gap: 12 (ref 5–15)
BUN: 6 mg/dL (ref 6–20)
CO2: 26 mmol/L (ref 22–32)
Calcium: 9.3 mg/dL (ref 8.9–10.3)
Chloride: 100 mmol/L — ABNORMAL LOW (ref 101–111)
Creatinine, Ser: 0.67 mg/dL (ref 0.61–1.24)
GFR calc Af Amer: >60 mL/min (ref >60)
GFR calc non Af Amer: >60 mL/min (ref >60)
Glucose, Bld: 119 mg/dL — ABNORMAL HIGH (ref 65–99)
Potassium: 3 mmol/L — ABNORMAL LOW (ref 3.5–5.1)
Sodium: 138 mmol/L (ref 135–145)

## 2014-06-21 MED ORDER — HYDROCHLOROTHIAZIDE 25 MG PO TABS
25.0000 mg | ORAL_TABLET | Freq: Every day | ORAL | Status: DC
  Administered 2014-06-21 – 2014-06-22 (×2): 25 mg via ORAL
  Filled 2014-06-21 (×2): qty 1

## 2014-06-21 MED ORDER — SODIUM CHLORIDE 0.9 % IV SOLN
Freq: Once | INTRAVENOUS | Status: AC
  Administered 2014-06-21: 12:00:00 via INTRAVENOUS
  Filled 2014-06-21: qty 500

## 2014-06-21 MED ORDER — BISACODYL 5 MG PO TBEC
15.0000 mg | DELAYED_RELEASE_TABLET | Freq: Once | ORAL | Status: AC
  Administered 2014-06-21: 15 mg via ORAL
  Filled 2014-06-21: qty 3

## 2014-06-21 MED ORDER — METOPROLOL TARTRATE 25 MG PO TABS
25.0000 mg | ORAL_TABLET | Freq: Two times a day (BID) | ORAL | Status: DC
  Administered 2014-06-21 – 2014-06-22 (×2): 25 mg via ORAL
  Filled 2014-06-21 (×2): qty 1

## 2014-06-21 MED ORDER — SODIUM CHLORIDE 0.9 % IV SOLN
INTRAVENOUS | Status: DC
  Filled 2014-06-21 (×8): qty 500

## 2014-06-21 NOTE — Progress Notes (Signed)
   Subjective:  Still passing flatus. No bowel movement yet. Extremely anxious to go home.  Vital signs in last 24 hours: Temp:  [98.4 F (36.9 C)-99.7 F (37.6 C)] 98.6 F (37 C) (05/06 0747) Pulse Rate:  [75-120] 120 (05/06 0747) Resp:  [16-21] 21 (05/06 0747) BP: (122-190)/(77-106) 122/77 mmHg (05/06 0747) SpO2:  [98 %-100 %] 99 % (05/06 0747)    Intake/Output from previous day: 07/13/2022 0701 - 05/06 0700 In: 3423 [I.V.:3303; NG/GT:30] Out: 3800 [Urine:3450; Emesis/NG output:350]  GI: soft, non-tender; bowel sounds normal; no masses,  no organomegaly  Lab Results:  CBC  Recent Labs  06/19/14 0525 06/19/14 1305  WBC 6.8 6.5  HGB 16.7 15.9  HCT 50.2 48.1  PLT 307 285   CMP     Component Value Date/Time   NA 138 06/21/2014 0510   NA 139 09/13/2013 0408   K 3.0* 06/21/2014 0510   K 3.3* 09/13/2013 0408   CL 100* 06/21/2014 0510   CL 105 09/13/2013 0408   CO2 26 06/21/2014 0510   CO2 27 09/13/2013 0408   GLUCOSE 119* 06/21/2014 0510   GLUCOSE 108* 09/13/2013 0408   BUN 6 06/21/2014 0510   BUN 4* 09/13/2013 0408   CREATININE 0.67 06/21/2014 0510   CREATININE 0.67 09/13/2013 0408   CALCIUM 9.3 06/21/2014 0510   CALCIUM 8.1* 09/13/2013 0408   PROT 7.5 06/19/2014 1305   PROT 6.2* 09/13/2013 0408   ALBUMIN 3.6 06/19/2014 1305   ALBUMIN 1.9* 09/13/2013 0408   AST 49* 06/19/2014 1305   AST 30 09/13/2013 0408   ALT 42 06/19/2014 1305   ALT 27 09/13/2013 0408   ALKPHOS 56 06/19/2014 1305   ALKPHOS 218* 09/13/2013 0408   BILITOT 1.0 06/19/2014 1305   GFRNONAA >60 06/21/2014 0510   GFRNONAA >60 09/13/2013 0408   GFRAA >60 06/21/2014 0510   GFRAA >60 09/13/2013 0408   PT/INR  Recent Labs  06/19/14 0525 06/19/14 1305  LABPROT 12.8 12.7  INR 0.94 0.93    Studies/Results: Dg Abd 2 Views  07/13/2014   CLINICAL DATA:  Followup small bowel obstruction.  EXAM: ABDOMEN - 2 VIEW  COMPARISON:  06/19/2014  FINDINGS: There are dilated loops small bowel with  air-fluid levels on the upright view. The degree of small bowel dilation has improved from the previous day's study. There is some residual contrast in a nondistended colon.  No free air.  Nasogastric tube is well positioned with its tip in the distal stomach, advanced from the previous day's study.  Single vascular clip is noted in the left pelvis. Soft tissues are otherwise unremarkable.  IMPRESSION: 1. Mild improvement from the previous day's study with less small bowel dilation. 2. Although improved, there still small bowel dilation with air-fluid levels consistent with residual partial small bowel obstruction. 3. No free air. 4. Nasogastric tube is well positioned.   Electronically Signed   By: Lajean Manes M.D.   On: 07/13/14 10:09    Assessment/Plan: Removed nasogastric tube Clear liquid diet Enema and Dulcolax tablets this afternoon Full liquid diet in the morning

## 2014-06-21 NOTE — Progress Notes (Signed)
Pt with L NGT to LIS, dark brown drainage with some sediment noted. PRN pain med given twice for Low abd pain rated 10/10.  Apresoline given once to due elevated blood pressure 169/103 (see parameters). Pt assist x1. Tolerates ice chips at this time. Call light and phone in reach, monitoring continued per M.D. Orders and unit  Protocol.

## 2014-06-21 NOTE — Progress Notes (Addendum)
[facility] [place] NAME: Samuel Hood    MR#:  [id number]  DATE OF BIRTH:  1950/12/02  SUBJECTIVE:  CHIEF COMPLAINT:   Chief Complaint  Patient presents with  . Abdominal Pain   Interacting well today. Still has NG with dark maroon drainage but decreased in quantity, Passing flatus, No BM yet.  REVIEW OF SYSTEMS:   CONSTITUTIONAL: No fever, fatigue or weakness.  EYES: No blurred or double vision.  EARS, NOSE, AND THROAT: No tinnitus or ear pain.  RESPIRATORY: No cough, shortness of breath, wheezing or hemoptysis.  CARDIOVASCULAR: No chest pain, orthopnea, edema.  GASTROINTESTINAL: Positive for abd pain, No nausea/vomiting.  GENITOURINARY: No dysuria, hematuria.  HEMATOLOGY: No anemia, easy bruising or bleeding SKIN: No rash or lesion. MUSCULOSKELETAL: No joint pain or arthritis.   NEUROLOGIC: No tingling, numbness, weakness.  PSYCHIATRY: No anxiety or depression.   DRUG ALLERGIES:  No Known Allergies  VITALS:  Blood pressure 122/77, pulse 120, temperature 98.6 F (37 C), temperature source Oral, resp. rate 21, height '5\' 5"'$  (1.651 m), weight 62.625 kg (138 lb 1 oz), SpO2 99 %.  PHYSICAL EXAMINATION:   GENERAL:  64 y.o.-year-old patient lying in the bed with no acute distress.  EYES: Pupils equal, round, reactive to light and accommodation. No scleral icterus. Extraocular muscles intact.  HEENT: Head atraumatic, normocephalic. Oropharynx and nasopharynx clear.  NECK:  Supple, no jugular venous distention. No thyroid enlargement, no tenderness.  LUNGS: Normal breath sounds bilaterally, no wheezing, rales,rhonchi or crepitation. No use of accessory muscles of respiration.  CARDIOVASCULAR: S1, S2 normal. No murmurs, rubs, or gallops.  ABDOMEN: Minimal abd discomfort on palpation, mild distention, no tenderness, guarding or rigidity. Good Bowel sounds present. No organomegaly or mass.  EXTREMITIES: No pedal edema, cyanosis, or  clubbing.  NEUROLOGIC: Cranial nerves II through XII are intact. Muscle strength 5/5 in all extremities. Sensation intact. Gait not checked.  PSYCHIATRIC: The patient is alert and oriented x 3.  SKIN: No obvious rash, lesion, or ulcer.    LABORATORY PANEL:   CBC  Recent Labs Lab 06/19/14 1305  WBC 6.5  HGB 15.9  HCT 48.1  PLT 285   ------------------------------------------------------------------------------------------------------------------  Chemistries   Recent Labs Lab 06/19/14 1305 06/21/14 0510  NA 144 138  K 3.6 3.0*  CL 106 100*  CO2 32 26  GLUCOSE 125* 119*  BUN 12 6  CREATININE 0.87 0.67  CALCIUM 9.1 9.3  MG 2.1  --   AST 49*  --   ALT 42  --   ALKPHOS 56  --   BILITOT 1.0  --    ------------------------------------------------------------------------------------------------------------------  Cardiac Enzymes No results for input(s): TROPONINI in the last 168 hours. ------------------------------------------------------------------------------------------------------------------  RADIOLOGY:  Dg Abd 2 Views  06/20/2014   CLINICAL DATA:  Followup small bowel obstruction.  EXAM: ABDOMEN - 2 VIEW  COMPARISON:  06/19/2014  FINDINGS: There are dilated loops small bowel with air-fluid levels on the upright view. The degree of small bowel dilation has improved from the previous day's study. There is some residual contrast in a nondistended colon.  No free air.  Nasogastric tube is well positioned with its tip in the distal stomach, advanced from the previous day's study.  Single vascular clip is noted in the left pelvis. Soft tissues are otherwise unremarkable.  IMPRESSION: 1. Mild improvement from the previous day's study with less small bowel dilation. 2. Although improved, there still small bowel dilation with air-fluid levels consistent  with residual partial small bowel obstruction. 3. No free air. 4. Nasogastric tube is well positioned.   Electronically Signed    By: Lajean Manes M.D.   On: 06/20/2014 10:09    EKG:  No orders found for this or any previous visit.  ASSESSMENT AND PLAN:   Active Problems:  Bowel obstruction  Essential hypertension  Hypokalemia   1) SBO - NPO and a NG tube in - KUB with improvement, remains NPO for now. But improved bowel sounds- likely NG will be discontinued today. Await surgical input. - Mgmt per surgical team  2) Hypertension - on IV hydralazine for SBP >160 - start IV metoprolol for today and once able to take PO- change to PO HCTZ adn also cont metoprolol PO '25mg'$  BID  3) Likely has GERD- uses advil and asa at home and now with coffee ground NG drainage. start IV protonix qdaily- change to PO at discharge  3) hypokalemia -  Replaced as needed esp in setting of GI losses   All the records are reviewed and case discussed with Care Management/Social Workerr. Management plans discussed with the patient, family and they are in agreement.  CODE STATUS: FULL CODE  TOTAL TIME TAKING CARE OF THIS PATIENT: 35 minutes.   POSSIBLE D/C IN 2 DAYS, DEPENDING ON CLINICAL CONDITION.   Amilyah Nack M.D on 06/21/2014 at 11:16 AM  Between 7am to 6pm - Pager - [phone]  After 6pm go to [email or web address] - password [email or web address] [facility]  Office  [phone]  CC: Primary care physician; Nathaneil Canary, PA-C  ADDENDUM: Pts NG tube taken out. Started on full liquids. Will change to PO metoprolol BID and also start his home dose HCTZ Possible discharge tomorrow Will sign off. Please call if any questions.

## 2014-06-22 NOTE — Plan of Care (Signed)
Problem: Phase II Progression Outcomes Goal: IV changed to normal saline lock Outcome: Not Met (add Reason) Continued receiving D5 NS with 20 meq of KCL  Problem: Phase III Progression Outcomes Goal: IV/normal saline lock discontinued Outcome: Not Met (add Reason) Continued receiving IV fluids at this time

## 2014-06-22 NOTE — Discharge Summary (Signed)
Patient ID: Samuel Hood. MRN: [id number] DOB/AGE: Apr 19, 1950 64 y.o.  Admit date: 06/18/2014 Discharge date: 06/22/2014  Discharge Diagnoses:  Partial small bowel obstruction  Procedures Performed: None  Discharged Condition: good  Hospital Course: The patient was admitted to the hospital with a nasogastric tube, which was ultimately discontinued and he was begun on a clear liquid diet. He was given an enema and Dulcolax tablets the day prior to discharge and he had 2 bowel movements that evening. He tolerated a full liquid diet the morning of discharge and was smiling and appreciative that morning.  Discharge Orders:   Disposition:   Discharge Medications:  Current facility-administered medications:  .  acetaminophen (TYLENOL) tablet 650 mg, 650 mg, Oral, Q6H PRN **OR** acetaminophen (TYLENOL) suppository 650 mg, 650 mg, Rectal, Q6H PRN, Molly Maduro, MD .  dextrose 5 % and 0.9 % NaCl with KCl 20 mEq/L infusion, , Intravenous, Continuous, Molly Maduro, MD, Last Rate: 125 mL/hr at 06/21/14 1607 .  diphenhydrAMINE (BENADRYL) injection 12.5 mg, 12.5 mg, Intravenous, Q6H PRN **OR** diphenhydrAMINE (BENADRYL) 12.5 MG/5ML elixir 12.5 mg, 12.5 mg, Oral, Q6H PRN, Molly Maduro, MD .  hydrALAZINE (APRESOLINE) injection 5 mg, 5 mg, Intravenous, Q6H PRN, Aldean Jewett, MD, 5 mg at 06/20/14 2352 .  hydrochlorothiazide (HYDRODIURIL) tablet 25 mg, 25 mg, Oral, Daily, Gladstone Lighter, MD, 25 mg at 06/21/14 1608 .  metoprolol tartrate (LOPRESSOR) tablet 25 mg, 25 mg, Oral, BID, Gladstone Lighter, MD, 25 mg at 06/21/14 2103 .  ondansetron (ZOFRAN) injection 4 mg, 4 mg, Intravenous, Q6H PRN, Molly Maduro, MD, 4 mg at 06/21/14 0518 .  pantoprazole (PROTONIX) injection 40 mg, 40 mg, Intravenous, QHS, Molly Maduro, MD, 40 mg at 06/21/14 2103 .  phenol (CHLORASEPTIC) mouth spray 1 spray, 1 spray, Mouth/Throat, PRN, Molly Maduro, MD, 1 spray at 06/21/14  1149  Follwup:   Signed: Consuela Mimes 06/22/2014, 9:40 AM

## 2014-09-26 ENCOUNTER — Emergency Department: Payer: No Typology Code available for payment source

## 2014-09-26 ENCOUNTER — Observation Stay: Payer: No Typology Code available for payment source

## 2014-09-26 ENCOUNTER — Encounter: Payer: Self-pay | Admitting: Emergency Medicine

## 2014-09-26 ENCOUNTER — Inpatient Hospital Stay
Admission: EM | Admit: 2014-09-26 | Discharge: 2014-09-30 | DRG: 337 | Disposition: A | Discharge status: Home / Self Care | Payer: No Typology Code available for payment source | Attending: Surgery | Admitting: Surgery

## 2014-09-26 DIAGNOSIS — K5669 Other intestinal obstruction: Secondary | ICD-10-CM | POA: Diagnosis not present

## 2014-09-26 DIAGNOSIS — K7689 Other specified diseases of liver: Secondary | ICD-10-CM | POA: Diagnosis present

## 2014-09-26 DIAGNOSIS — K460 Unspecified abdominal hernia with obstruction, without gangrene: Secondary | ICD-10-CM | POA: Diagnosis not present

## 2014-09-26 DIAGNOSIS — Z7982 Long term (current) use of aspirin: Secondary | ICD-10-CM

## 2014-09-26 DIAGNOSIS — Z809 Family history of malignant neoplasm, unspecified: Secondary | ICD-10-CM

## 2014-09-26 DIAGNOSIS — K5660 Unspecified intestinal obstruction: Secondary | ICD-10-CM | POA: Diagnosis not present

## 2014-09-26 DIAGNOSIS — I10 Essential (primary) hypertension: Secondary | ICD-10-CM | POA: Diagnosis present

## 2014-09-26 DIAGNOSIS — Z9049 Acquired absence of other specified parts of digestive tract: Secondary | ICD-10-CM | POA: Diagnosis present

## 2014-09-26 DIAGNOSIS — Z79899 Other long term (current) drug therapy: Secondary | ICD-10-CM

## 2014-09-26 DIAGNOSIS — K56609 Unspecified intestinal obstruction, unspecified as to partial versus complete obstruction: Secondary | ICD-10-CM | POA: Diagnosis present

## 2014-09-26 LAB — CBC WITH DIFFERENTIAL/PLATELET
Basophils Absolute: 0.1 10*3/uL (ref 0–0.1)
Basophils Relative: 1 %
Eosinophils Absolute: 0 10*3/uL (ref 0–0.7)
Eosinophils Relative: 1 %
HCT: 52.7 % — ABNORMAL HIGH (ref 40.0–52.0)
Hemoglobin: 17.8 g/dL (ref 13.0–18.0)
Lymphocytes Relative: 22 %
Lymphs Abs: 1.6 10*3/uL (ref 1.0–3.6)
MCH: 30.7 pg (ref 26.0–34.0)
MCHC: 33.8 g/dL (ref 32.0–36.0)
MCV: 90.8 fL (ref 80.0–100.0)
Monocytes Absolute: 0.6 10*3/uL (ref 0.2–1.0)
Monocytes Relative: 8 %
Neutro Abs: 5.2 10*3/uL (ref 1.4–6.5)
Neutrophils Relative %: 68 %
Platelets: 293 10*3/uL (ref 150–440)
RBC: 5.8 MIL/uL (ref 4.40–5.90)
RDW: 15.6 % — ABNORMAL HIGH (ref 11.5–14.5)
WBC: 7.5 10*3/uL (ref 3.8–10.6)

## 2014-09-26 LAB — COMPREHENSIVE METABOLIC PANEL
ALT: 55 U/L (ref 17–63)
AST: 58 U/L — ABNORMAL HIGH (ref 15–41)
Albumin: 4.6 g/dL (ref 3.5–5.0)
Alkaline Phosphatase: 70 U/L (ref 38–126)
Anion gap: 9 (ref 5–15)
BUN: 11 mg/dL (ref 6–20)
CO2: 33 mmol/L — ABNORMAL HIGH (ref 22–32)
Calcium: 10.5 mg/dL — ABNORMAL HIGH (ref 8.9–10.3)
Chloride: 96 mmol/L — ABNORMAL LOW (ref 101–111)
Creatinine, Ser: 1.08 mg/dL (ref 0.61–1.24)
GFR calc Af Amer: >60 mL/min (ref >60)
GFR calc non Af Amer: >60 mL/min (ref >60)
Glucose, Bld: 128 mg/dL — ABNORMAL HIGH (ref 65–99)
Potassium: 3.9 mmol/L (ref 3.5–5.1)
Sodium: 138 mmol/L (ref 135–145)
Total Bilirubin: 0.7 mg/dL (ref 0.3–1.2)
Total Protein: 9.1 g/dL — ABNORMAL HIGH (ref 6.5–8.1)

## 2014-09-26 LAB — URINALYSIS COMPLETE WITH MICROSCOPIC (ARMC ONLY)
Bacteria, UA: NONE SEEN
Bilirubin Urine: NEGATIVE
Glucose, UA: NEGATIVE mg/dL
Leukocytes, UA: NEGATIVE
Nitrite: NEGATIVE
Protein, ur: 30 mg/dL — AB
Specific Gravity, Urine: 1.019 (ref 1.005–1.030)
pH: 6 (ref 5.0–8.0)

## 2014-09-26 LAB — LIPASE, BLOOD: Lipase: 52 U/L — ABNORMAL HIGH (ref 22–51)

## 2014-09-26 MED ORDER — SODIUM CHLORIDE 0.9 % IV SOLN
INTRAVENOUS | Status: DC
  Administered 2014-09-27: 02:00:00 via INTRAVENOUS

## 2014-09-26 MED ORDER — ONDANSETRON HCL 4 MG/2ML IJ SOLN
4.0000 mg | Freq: Four times a day (QID) | INTRAMUSCULAR | Status: DC | PRN
  Administered 2014-09-27: 4 mg via INTRAVENOUS

## 2014-09-26 MED ORDER — PANTOPRAZOLE SODIUM 40 MG IV SOLR
40.0000 mg | Freq: Every day | INTRAVENOUS | Status: DC
  Administered 2014-09-27 – 2014-09-28 (×3): 40 mg via INTRAVENOUS
  Filled 2014-09-26 (×3): qty 40

## 2014-09-26 MED ORDER — HYDROMORPHONE HCL 1 MG/ML IJ SOLN
1.0000 mg | Freq: Once | INTRAMUSCULAR | Status: AC
  Administered 2014-09-26: 1 mg via INTRAVENOUS

## 2014-09-26 MED ORDER — ACETAMINOPHEN 650 MG RE SUPP: 650.0000 mg | Freq: Four times a day (QID) | RECTAL | Status: DC | PRN

## 2014-09-26 MED ORDER — ACETAMINOPHEN 325 MG PO TABS: 650.0000 mg | ORAL_TABLET | Freq: Four times a day (QID) | ORAL | Status: DC | PRN

## 2014-09-26 MED ORDER — IOHEXOL 240 MG/ML SOLN
50.0000 mL | INTRAMUSCULAR | Status: AC
  Administered 2014-09-26: 50 mL via ORAL

## 2014-09-26 MED ORDER — HYDROMORPHONE HCL 1 MG/ML IJ SOLN: 0.5000 mg | INTRAMUSCULAR | Status: DC | PRN

## 2014-09-26 MED ORDER — MORPHINE SULFATE 4 MG/ML IJ SOLN
4.0000 mg | Freq: Once | INTRAMUSCULAR | Status: AC
  Administered 2014-09-26: 4 mg via INTRAVENOUS
  Filled 2014-09-26: qty 1

## 2014-09-26 MED ORDER — ONDANSETRON 4 MG PO TBDP: 4.0000 mg | ORAL_TABLET | Freq: Four times a day (QID) | ORAL | Status: DC | PRN

## 2014-09-26 MED ORDER — HYDROMORPHONE HCL 1 MG/ML IJ SOLN
INTRAMUSCULAR | Status: AC
  Administered 2014-09-26: 1 mg via INTRAVENOUS
  Filled 2014-09-26: qty 1

## 2014-09-26 MED ORDER — IOHEXOL 300 MG/ML  SOLN
100.0000 mL | Freq: Once | INTRAMUSCULAR | Status: AC | PRN
Start: 2014-09-26 — End: 2014-09-26
  Administered 2014-09-26: 100 mL via INTRAVENOUS

## 2014-09-26 MED ORDER — HEPARIN SODIUM (PORCINE) 5000 UNIT/ML IJ SOLN
5000.0000 [IU] | Freq: Three times a day (TID) | INTRAMUSCULAR | Status: DC
  Administered 2014-09-27 – 2014-09-30 (×9): 5000 [IU] via SUBCUTANEOUS
  Filled 2014-09-26 (×9): qty 1

## 2014-09-26 MED ORDER — ONDANSETRON HCL 4 MG/2ML IJ SOLN
4.0000 mg | Freq: Once | INTRAMUSCULAR | Status: AC
  Administered 2014-09-26: 4 mg via INTRAVENOUS
  Filled 2014-09-26: qty 2

## 2014-09-26 NOTE — ED Notes (Signed)
States he developed some abd pain yesterday with nausea/vomiting. Was seen by his md today and placed on hyoscyamine..states she is still having pain and now noticed some blood in emesis

## 2014-09-26 NOTE — ED Provider Notes (Signed)
[facility] Emergency Department Provider Note   ____________________________________________  Time seen: 1905  I have reviewed the triage vital signs and the nursing notes.   HISTORY  Chief Complaint Abdominal Pain   History limited by: Not Limited   HPI Samuel Defina. is a 64 y.o. male who presents to the emergency department today because of concerns for abdominal pain. He states that it started getting worse yesterday.It is located primarily in his lower abdomen. It has been constant. Has been gradually increasing. It has been accompanied by nausea. He states to went to his primary care doctor's office and was prescribed a medication which has not been helping. He denies any fevers. He does state the last time he had pain similar to this he was admitted to the hospital. He unfortunately does not recall the exact name of what was wrong with him.     Past Medical History  Diagnosis Date  . Hypertension     Patient Active Problem List   Diagnosis Date Noted  . Essential hypertension 06/19/2014  . Hypokalemia 06/19/2014  . Bowel obstruction 06/18/2014    Past Surgical History  Procedure Laterality Date  . Colon surgery      Current Outpatient Rx  Name  Route  Sig  Dispense  Refill  . aspirin EC 325 MG tablet   Oral   Take 650 mg by mouth every 4 (four) hours as needed for mild pain.          . hydrochlorothiazide (HYDRODIURIL) 25 MG tablet   Oral   Take 25 mg by mouth daily.         Marland Kitchen ibuprofen (ADVIL,MOTRIN) 200 MG tablet   Oral   Take 400 mg by mouth every 6 (six) hours as needed for mild pain.            Allergies Review of patient's allergies indicates no known allergies.  Family History  Problem Relation Age of Onset  . Cancer Mother   . Cancer Father     Social History Social History  Substance Use Topics  . Smoking status: Current Every Day Smoker  . Smokeless tobacco: None  . Alcohol Use: Yes    Review  of Systems  Constitutional: Negative for fever. Cardiovascular: Negative for chest pain. Respiratory: Negative for shortness of breath. Gastrointestinal: Positive for abdominal pain and nausea Genitourinary: Negative for dysuria. Musculoskeletal: Negative for back pain. Skin: Negative for rash. Neurological: Negative for headaches, focal weakness or numbness.   10-point ROS otherwise negative.  ____________________________________________   PHYSICAL EXAM:  VITAL SIGNS: ED Triage Vitals  Enc Vitals Group     BP 09/26/14 1740 153/100 mmHg     Pulse Rate 09/26/14 1740 104     Resp 09/26/14 1740 20     Temp 09/26/14 1740 98.3 F (36.8 C)     Temp Source 09/26/14 1740 Oral     SpO2 09/26/14 1740 98 %     Weight 09/26/14 1740 120 lb (54.432 kg)     Height 09/26/14 1740 '5\' 5"'$  (1.651 m)     Head Cir --      Peak Flow --      Pain Score 09/26/14 1741 8   Constitutional: Alert and oriented. Well appearing and in no distress. Eyes: Conjunctivae are normal. PERRL. Normal extraocular movements. ENT   Head: Normocephalic and atraumatic.   Nose: No congestion/rhinnorhea.   Mouth/Throat: Mucous membranes are moist.   Neck: No stridor. Hematological/Lymphatic/Immunilogical: No cervical lymphadenopathy.  Cardiovascular: Normal rate, regular rhythm.  No murmurs, rubs, or gallops. Respiratory: Normal respiratory effort without tachypnea nor retractions. Breath sounds are clear and equal bilaterally. No wheezes/rales/rhonchi. Gastrointestinal: Soft. Tender to palpation of lower abdomen. Multiple surgical scars present. Genitourinary: Deferred Musculoskeletal: Normal range of motion in all extremities. No joint effusions.  No lower extremity tenderness nor edema. Neurologic:  Normal speech and language. No gross focal neurologic deficits are appreciated. Speech is normal.  Skin:  Skin is warm, dry and intact. No rash noted. Psychiatric: Mood and affect are normal. Speech and  behavior are normal. Patient exhibits appropriate insight and judgment.  ____________________________________________    LABS (pertinent positives/negatives)  Labs Reviewed  COMPREHENSIVE METABOLIC PANEL - Abnormal; Notable for the following:    Chloride 96 (*)    CO2 33 (*)    Glucose, Bld 128 (*)    Calcium 10.5 (*)    Total Protein 9.1 (*)    AST 58 (*)    All other components within normal limits  CBC WITH DIFFERENTIAL/PLATELET - Abnormal; Notable for the following:    HCT 52.7 (*)    RDW 15.6 (*)    All other components within normal limits  LIPASE, BLOOD - Abnormal; Notable for the following:    Lipase 52 (*)    All other components within normal limits  URINALYSIS COMPLETEWITH MICROSCOPIC ([facility] ONLY) - Abnormal; Notable for the following:    Color, Urine AMBER (*)    APPearance CLEAR (*)    Ketones, ur TRACE (*)    Hgb urine dipstick 1+ (*)    Protein, ur 30 (*)    Squamous Epithelial / LPF 0-5 (*)    All other components within normal limits     ____________________________________________   EKG  None  ____________________________________________    RADIOLOGY  CT abdomen and pelvis Small bowel obstruction ____________________________________________   PROCEDURES  Procedure(s) performed: None  Critical Care performed: No  ____________________________________________   INITIAL IMPRESSION / ASSESSMENT AND PLAN / ED COURSE  Pertinent labs & imaging results that were available during my care of the patient were reviewed by me and considered in my medical decision making (see chart for details).  Patient presented to the emergency department today because of abdominal pain. Patient does have a history of multiple abdominal surgeries and has had partial small bowel obstruction past. On exam patient somewhat tender in the lower abdomen. CT scan does show a small bowel obstruction. Surgery was consulted and they will admit the patient for further  management.  ____________________________________________   FINAL CLINICAL IMPRESSION(S) / ED DIAGNOSES  Final diagnoses:  Small bowel obstruction     Nance Pear, MD 09/27/14 0001

## 2014-09-26 NOTE — H&P (Signed)
CC: Abdominal pain, nausea/vomiting x 1 day  HPI: [name] is a pleasant 64 yo M with a history of sigmoid colectomy for tubulovillous adenoma and recent past history of SBO who presents with 1 day of acute onset abdominal pain, nausea and vomiting.  Located diffusely.  Initially saw PCP who gave him zofran without resolution.  Still reporting BM and flatus.  No fevers/chills, night sweats, shortness of breath, cough, chest pain, dysuria/hematuria.  Active Ambulatory Problems    Diagnosis Date Noted  . Bowel obstruction 06/18/2014  . Essential hypertension 06/19/2014  . Hypokalemia 06/19/2014   Resolved Ambulatory Problems    Diagnosis Date Noted  . No Resolved Ambulatory Problems   Past Medical History  Diagnosis Date  . Hypertension    Past Surgical History  Procedure Laterality Date  . Colon surgery       Medication List    ASK your doctor about these medications        aspirin EC 325 MG tablet  Take 650 mg by mouth every 4 (four) hours as needed for mild pain.     hydrochlorothiazide 25 MG tablet  Commonly known as:  HYDRODIURIL  Take 25 mg by mouth daily.     ibuprofen 200 MG tablet  Commonly known as:  ADVIL,MOTRIN  Take 400 mg by mouth every 6 (six) hours as needed for mild pain.       No Known Allergies   Social History   Social History  . Marital Status: Single    Spouse Name: N/A  . Number of Children: N/A  . Years of Education: N/A   Occupational History  . Not on file.   Social History Main Topics  . Smoking status: Current Every Day Smoker  . Smokeless tobacco: Not on file  . Alcohol Use: Yes  . Drug Use: Not on file  . Sexual Activity: Not on file   Other Topics Concern  . Not on file   Social History Narrative   Family History  Problem Relation Age of Onset  . Cancer Mother   . Cancer Father    ROS: Full ROS obtained, pertinent positives and negatives as above.  Blood pressure 144/100, pulse 90, temperature 98.3 F (36.8 C),  temperature source Oral, resp. rate 14, height '5\' 5"'$  (1.651 m), weight 120 lb (54.432 kg), SpO2 98 %. GEN: NAD/A&Ox3 FACE: no obvious facial trauma, normal external nose, normal external ears EYES: no scleral icterus, no conjunctivitis HEAD: normocephalic atraumatic CV: RRR, no MRG RESP: moving air well, lungs clear ABD: soft, min tender, moderately distended EXT: moving all ext well, strength 5/5 NEURO: cnII-XII grossly intact, sensation intact all 4 ext  Labs: personally reviewed, significant for WBC 7.5 (68% neutrophils) Cr 1.08, BUN 11, CO2 33, Cl 96  CT: Personally reviewed Proximal dilatation with air fluid levels Transition point in pelvis, some mesenteric swirling, no pneumatosis or wall thickening Two lesions in liver  A/P 64 yo with history of sigmoid colectomy and prior SBO who presents with clinical and radiographic SBO.  Min pain on exam, HR improving with resuscitation.  Labs relatively unremarkable.  No acute surgical issues.  Will admit for resuscitation, NG decompression.  Will obtain KUB in am to evaluate contrast progression.  Will need to have liver lesions investigated.

## 2014-09-26 NOTE — ED Notes (Signed)
States pain x 4 months - pt states has been admitted 2x for this but is unable to say what the dx was.

## 2014-09-27 ENCOUNTER — Encounter: Payer: Self-pay | Admitting: Anesthesiology

## 2014-09-27 ENCOUNTER — Observation Stay: Payer: No Typology Code available for payment source

## 2014-09-27 ENCOUNTER — Observation Stay: Payer: No Typology Code available for payment source | Admitting: Anesthesiology

## 2014-09-27 ENCOUNTER — Encounter
Admission: EM | Disposition: A | Discharge status: Home / Self Care | Payer: Self-pay | Source: Home / Self Care | Attending: Surgery

## 2014-09-27 DIAGNOSIS — Z9049 Acquired absence of other specified parts of digestive tract: Secondary | ICD-10-CM | POA: Diagnosis present

## 2014-09-27 DIAGNOSIS — K460 Unspecified abdominal hernia with obstruction, without gangrene: Secondary | ICD-10-CM | POA: Diagnosis present

## 2014-09-27 DIAGNOSIS — K56609 Unspecified intestinal obstruction, unspecified as to partial versus complete obstruction: Secondary | ICD-10-CM | POA: Insufficient documentation

## 2014-09-27 DIAGNOSIS — Z79899 Other long term (current) drug therapy: Secondary | ICD-10-CM | POA: Diagnosis not present

## 2014-09-27 DIAGNOSIS — Z809 Family history of malignant neoplasm, unspecified: Secondary | ICD-10-CM | POA: Diagnosis not present

## 2014-09-27 DIAGNOSIS — K7689 Other specified diseases of liver: Secondary | ICD-10-CM | POA: Diagnosis present

## 2014-09-27 DIAGNOSIS — K5669 Other intestinal obstruction: Secondary | ICD-10-CM

## 2014-09-27 DIAGNOSIS — Z7982 Long term (current) use of aspirin: Secondary | ICD-10-CM | POA: Diagnosis not present

## 2014-09-27 DIAGNOSIS — K5660 Unspecified intestinal obstruction: Secondary | ICD-10-CM | POA: Diagnosis present

## 2014-09-27 DIAGNOSIS — I10 Essential (primary) hypertension: Secondary | ICD-10-CM | POA: Diagnosis present

## 2014-09-27 HISTORY — PX: LAPAROTOMY: SHX154

## 2014-09-27 LAB — CBC
HCT: 49.9 % (ref 40.0–52.0)
Hemoglobin: 16.8 g/dL (ref 13.0–18.0)
MCH: 30.8 pg (ref 26.0–34.0)
MCHC: 33.6 g/dL (ref 32.0–36.0)
MCV: 91.6 fL (ref 80.0–100.0)
Platelets: 271 10*3/uL (ref 150–440)
RBC: 5.45 MIL/uL (ref 4.40–5.90)
RDW: 15.5 % — ABNORMAL HIGH (ref 11.5–14.5)
WBC: 6.2 10*3/uL (ref 3.8–10.6)

## 2014-09-27 LAB — COMPREHENSIVE METABOLIC PANEL
ALT: 52 U/L (ref 17–63)
AST: 57 U/L — ABNORMAL HIGH (ref 15–41)
Albumin: 4 g/dL (ref 3.5–5.0)
Alkaline Phosphatase: 64 U/L (ref 38–126)
Anion gap: 15 (ref 5–15)
BUN: 15 mg/dL (ref 6–20)
CO2: 29 mmol/L (ref 22–32)
Calcium: 9.6 mg/dL (ref 8.9–10.3)
Chloride: 93 mmol/L — ABNORMAL LOW (ref 101–111)
Creatinine, Ser: 0.98 mg/dL (ref 0.61–1.24)
GFR calc Af Amer: >60 mL/min (ref >60)
GFR calc non Af Amer: >60 mL/min (ref >60)
Glucose, Bld: 119 mg/dL — ABNORMAL HIGH (ref 65–99)
Potassium: 3.1 mmol/L — ABNORMAL LOW (ref 3.5–5.1)
Sodium: 137 mmol/L (ref 135–145)
Total Bilirubin: 1 mg/dL (ref 0.3–1.2)
Total Protein: 8.2 g/dL — ABNORMAL HIGH (ref 6.5–8.1)

## 2014-09-27 LAB — MRSA PCR SCREENING: MRSA by PCR: NEGATIVE

## 2014-09-27 LAB — PHOSPHORUS: Phosphorus: 4.4 mg/dL (ref 2.5–4.6)

## 2014-09-27 LAB — MAGNESIUM: Magnesium: 1.8 mg/dL (ref 1.7–2.4)

## 2014-09-27 SURGERY — LAPAROTOMY, EXPLORATORY
Anesthesia: General | Site: Abdomen | Wound class: Clean Contaminated

## 2014-09-27 MED ORDER — CEFAZOLIN SODIUM-DEXTROSE 2-3 GM-% IV SOLR
2.0000 g | INTRAVENOUS | Status: AC
  Administered 2014-09-27: 2 g via INTRAVENOUS
  Filled 2014-09-27: qty 50

## 2014-09-27 MED ORDER — HYDROMORPHONE HCL 1 MG/ML IJ SOLN
1.0000 mg | INTRAMUSCULAR | Status: DC | PRN
  Administered 2014-09-27 – 2014-09-29 (×18): 1 mg via INTRAVENOUS
  Filled 2014-09-27 (×18): qty 1

## 2014-09-27 MED ORDER — DEXAMETHASONE SODIUM PHOSPHATE 4 MG/ML IJ SOLN
INTRAMUSCULAR | Status: DC | PRN
  Administered 2014-09-27: 5 mg via INTRAVENOUS

## 2014-09-27 MED ORDER — HYDRALAZINE HCL 20 MG/ML IJ SOLN
10.0000 mg | Freq: Once | INTRAMUSCULAR | Status: AC
  Administered 2014-09-27: 10 mg via INTRAVENOUS

## 2014-09-27 MED ORDER — NEOSTIGMINE METHYLSULFATE 10 MG/10ML IV SOLN
INTRAVENOUS | Status: DC | PRN
  Administered 2014-09-27 (×2): 2 mg via INTRAVENOUS

## 2014-09-27 MED ORDER — FENTANYL CITRATE (PF) 100 MCG/2ML IJ SOLN
INTRAMUSCULAR | Status: AC
  Filled 2014-09-27: qty 2

## 2014-09-27 MED ORDER — HYDROMORPHONE HCL 1 MG/ML IJ SOLN
INTRAMUSCULAR | Status: AC
  Filled 2014-09-27: qty 1

## 2014-09-27 MED ORDER — HYDRALAZINE HCL 20 MG/ML IJ SOLN
10.0000 mg | INTRAMUSCULAR | Status: DC | PRN
  Filled 2014-09-27: qty 1

## 2014-09-27 MED ORDER — ESMOLOL HCL 10 MG/ML IV SOLN
INTRAVENOUS | Status: DC | PRN
  Administered 2014-09-27: 10 ug via INTRAVENOUS

## 2014-09-27 MED ORDER — GLYCOPYRROLATE 0.2 MG/ML IJ SOLN
INTRAMUSCULAR | Status: DC | PRN
  Administered 2014-09-27 (×2): 0.2 mg via INTRAVENOUS

## 2014-09-27 MED ORDER — LIDOCAINE HCL (CARDIAC) 20 MG/ML IV SOLN
INTRAVENOUS | Status: DC | PRN
  Administered 2014-09-27: 60 mg via INTRAVENOUS

## 2014-09-27 MED ORDER — FENTANYL CITRATE (PF) 100 MCG/2ML IJ SOLN
25.0000 ug | INTRAMUSCULAR | Status: DC | PRN
  Administered 2014-09-27 (×3): 50 ug via INTRAVENOUS

## 2014-09-27 MED ORDER — 0.9 % SODIUM CHLORIDE (POUR BTL) OPTIME
TOPICAL | Status: DC | PRN
  Administered 2014-09-27: 500 mL

## 2014-09-27 MED ORDER — HYDROMORPHONE HCL 1 MG/ML IJ SOLN
0.2500 mg | INTRAMUSCULAR | Status: DC | PRN
  Administered 2014-09-27 (×2): 0.5 mg via INTRAVENOUS

## 2014-09-27 MED ORDER — KCL IN DEXTROSE-NACL 40-5-0.45 MEQ/L-%-% IV SOLN
INTRAVENOUS | Status: DC
  Administered 2014-09-27 – 2014-09-30 (×6): via INTRAVENOUS
  Filled 2014-09-27 (×11): qty 1000

## 2014-09-27 MED ORDER — MIDAZOLAM HCL 2 MG/2ML IJ SOLN
INTRAMUSCULAR | Status: DC | PRN
  Administered 2014-09-27: 2 mg via INTRAVENOUS

## 2014-09-27 MED ORDER — PROPOFOL 10 MG/ML IV BOLUS
INTRAVENOUS | Status: DC | PRN
  Administered 2014-09-27: 130 mg via INTRAVENOUS

## 2014-09-27 MED ORDER — FENTANYL CITRATE (PF) 100 MCG/2ML IJ SOLN
INTRAMUSCULAR | Status: DC | PRN
  Administered 2014-09-27: 100 ug via INTRAVENOUS

## 2014-09-27 MED ORDER — PHENOL 1.4 % MT LIQD: 1.0000 | OROMUCOSAL | Status: DC | PRN

## 2014-09-27 MED ORDER — ROCURONIUM BROMIDE 100 MG/10ML IV SOLN
INTRAVENOUS | Status: DC | PRN
  Administered 2014-09-27: 25 mg via INTRAVENOUS

## 2014-09-27 MED ORDER — PHENYLEPHRINE HCL 10 MG/ML IJ SOLN
INTRAMUSCULAR | Status: DC | PRN
  Administered 2014-09-27 (×2): 200 ug via INTRAVENOUS
  Administered 2014-09-27: 100 ug via INTRAVENOUS
  Administered 2014-09-27: 200 ug via INTRAVENOUS

## 2014-09-27 MED ORDER — PROMETHAZINE HCL 25 MG/ML IJ SOLN: 6.2500 mg | INTRAMUSCULAR | Status: DC | PRN

## 2014-09-27 MED ORDER — SUCCINYLCHOLINE CHLORIDE 20 MG/ML IJ SOLN
INTRAMUSCULAR | Status: DC | PRN
  Administered 2014-09-27: 60 mg via INTRAVENOUS

## 2014-09-27 MED ORDER — LACTATED RINGERS IV SOLN
INTRAVENOUS | Status: DC | PRN
  Administered 2014-09-27: 12:00:00 via INTRAVENOUS

## 2014-09-27 SURGICAL SUPPLY — 29 items
CANISTER SUCT 1200ML W/VALVE (MISCELLANEOUS) ×2 IMPLANT
CATH TRAY 16F METER LATEX (MISCELLANEOUS) ×2 IMPLANT
DRAPE INCISE IOBAN 66X45 STRL (DRAPES) IMPLANT
DRAPE LAPAROTOMY 100X77 ABD (DRAPES) ×2 IMPLANT
DRSG OPSITE POSTOP 4X10 (GAUZE/BANDAGES/DRESSINGS) ×2 IMPLANT
ELECT CAUTERY NEEDLE TIP 1.0 (MISCELLANEOUS) ×2
ELECTRODE CAUTERY NEDL TIP 1.0 (MISCELLANEOUS) ×1 IMPLANT
GAUZE SPONGE 4X4 12PLY STRL (GAUZE/BANDAGES/DRESSINGS) IMPLANT
GLOVE BIO SURGEON STRL SZ7.5 (GLOVE) ×8 IMPLANT
GLOVE INDICATOR 8.0 STRL GRN (GLOVE) ×10 IMPLANT
GOWN STRL REUS W/ TWL LRG LVL3 (GOWN DISPOSABLE) ×3 IMPLANT
GOWN STRL REUS W/TWL LRG LVL3 (GOWN DISPOSABLE) ×3
KIT RM TURNOVER STRD PROC AR (KITS) ×2 IMPLANT
LABEL OR SOLS (LABEL) ×2 IMPLANT
LIGASURE 5MM LAPAROSCOPIC (INSTRUMENTS) IMPLANT
NS IRRIG 1000ML POUR BTL (IV SOLUTION) ×2 IMPLANT
PACK BASIN MAJOR ARMC (MISCELLANEOUS) ×2 IMPLANT
PACK COLON CLEAN CLOSURE (MISCELLANEOUS) ×2 IMPLANT
PAD GROUND ADULT SPLIT (MISCELLANEOUS) ×2 IMPLANT
STAPLER SKIN PROX 35W (STAPLE) ×2 IMPLANT
STRIP CLOSURE SKIN 1/2X4 (GAUZE/BANDAGES/DRESSINGS) IMPLANT
SUT MAXON ABS #0 GS21 30IN (SUTURE) ×6 IMPLANT
SUT PDS AB 1 TP1 96 (SUTURE) ×2 IMPLANT
SUT SILK 3-0 (SUTURE) ×1
SUT SILK 3-0 SH-1 18XCR BRD (SUTURE) ×1
SUT VIC AB 3-0 SH 27 (SUTURE)
SUT VIC AB 3-0 SH 27X BRD (SUTURE) IMPLANT
SUT VICRYL+ 3-0 144IN (SUTURE) ×2 IMPLANT
SUTURE SILK 3-0 SH-1 18XCR BRD (SUTURE) ×1 IMPLANT

## 2014-09-27 NOTE — Progress Notes (Signed)
Subjective:   This gentleman is well-known to me from his previous operation. He underwent a colectomy for dysplastic polyp and then had a significant postoperative bleed requiring reexploration. No specific bleeding site was identified and the source was thought to possibly be a mesenteric bleeding site. He had a very slow return of bowel function and delayed postoperative recovery.  He was readmitted in May with what appear to be a small bowel obstruction which resolved spontaneously. However there did appear to be a significant swirl sign in the mesentery consistent with possible volvulus or internal hernia. His symptoms improve with nasogastric suction and the contrast passed into his colon. However he's not been better since that time. He began have significant abdominal pain yesterday but had been nauseated and bloated for several days. Follow-up workup does demonstrate what appears to be a persistent small bowel obstruction. Does appear to be high-grade mechanical obstruction. Of note is the fact that he does have a small lesion in his liver that has poorly characterized may represent some sort of malignancy.  Vital signs in last 24 hours: Temp:  [97.3 F (36.3 C)-98.3 F (36.8 C)] 97.3 F (36.3 C) (08/12 0125) Pulse Rate:  [87-104] 95 (08/12 0125) Resp:  [14-26] 18 (08/12 0125) BP: (124-161)/(86-125) 152/98 mmHg (08/12 0125) SpO2:  [98 %-100 %] 99 % (08/12 0125) Weight:  [120 lb (54.432 kg)] 120 lb (54.432 kg) (08/11 1740) Last BM Date: 09/25/14  Intake/Output from previous day: 08/11 0701 - 08/12 0700 In: 490 [I.V.:490] Out: 450 [Emesis/NG output:450]  Exam:  This abdomen is mildly distended with minimal tenderness and hypoactive bowel sounds. No guarding or rebound are noted.  Lab Results:  CBC  Recent Labs  09/26/14 1750 09/27/14 0336  WBC 7.5 6.2  HGB 17.8 16.8  HCT 52.7* 49.9  PLT 293 271   CMP     Component Value Date/Time   NA 137 09/27/2014 0336   NA 139  09/13/2013 0408   K 3.1* 09/27/2014 0336   K 3.3* 09/13/2013 0408   CL 93* 09/27/2014 0336   CL 105 09/13/2013 0408   CO2 29 09/27/2014 0336   CO2 27 09/13/2013 0408   GLUCOSE 119* 09/27/2014 0336   GLUCOSE 108* 09/13/2013 0408   BUN 15 09/27/2014 0336   BUN 4* 09/13/2013 0408   CREATININE 0.98 09/27/2014 0336   CREATININE 0.67 09/13/2013 0408   CALCIUM 9.6 09/27/2014 0336   CALCIUM 8.1* 09/13/2013 0408   PROT 8.2* 09/27/2014 0336   PROT 6.2* 09/13/2013 0408   ALBUMIN 4.0 09/27/2014 0336   ALBUMIN 1.9* 09/13/2013 0408   AST 57* 09/27/2014 0336   AST 30 09/13/2013 0408   ALT 52 09/27/2014 0336   ALT 27 09/13/2013 0408   ALKPHOS 64 09/27/2014 0336   ALKPHOS 218* 09/13/2013 0408   BILITOT 1.0 09/27/2014 0336   BILITOT 0.9 09/13/2013 0408   GFRNONAA >60 09/27/2014 0336   GFRNONAA >60 09/13/2013 0408   GFRAA >60 09/27/2014 0336   GFRAA >60 09/13/2013 0408   PT/INR No results for input(s): LABPROT, INR in the last 72 hours.  Studies/Results: Ct Abdomen Pelvis W Contrast  09/26/2014   CLINICAL DATA:  Lower abdominal pain beginning 09/25/2014 with nausea and vomiting. History of sigmoid colectomy July, 2015.  EXAM: CT ABDOMEN AND PELVIS WITH CONTRAST  TECHNIQUE: Multidetector CT imaging of the abdomen and pelvis was performed using the standard protocol following bolus administration of intravenous contrast.  CONTRAST:  100 mL OMNIPAQUE IOHEXOL 300 MG/ML  SOLN  COMPARISON:  CT abdomen and pelvis 06/18/2014 and 09/13/2013.  FINDINGS: The lung bases are clear. No pleural or pericardial effusion. Heart size is normal.  The patient has a small bowel obstruction with loops dilated to 4.0 cm and air-fluid levels in small bowel. The transition point is identified centrally in the pelvis in the distal jejunum and is best seen on image 59 of the coronal series. As noted on prior examinations, the mesentery has a swirled appearance in this area and there may be an internal hernia present. Distal  small bowel loops are completely decompressed. Surgical anastomosis in the sigmoid colon is noted. No pneumatosis, portal venous gas or free air is identified. Small lymph nodes in the root of the mesenteric are likely somewhat prominent due to congestion. No pathologically enlarged nodes are seen.  The liver is low attenuating consistent with fatty infiltration. There is a focal lesion in the right hepatic lobe which measures 1.8 cm on image 13. The lesion is barely perceptible on the most recent CT scan where it measured 1.2 cm. There may be a second lesion in the right hepatic lobe measuring 1 cm on image 17. No other focal liver lesion is identified. The adrenal glands, spleen, pancreas and left kidney are unremarkable. Two small right renal cysts are unchanged. Aortoiliac atherosclerosis without aneurysm is identified. The urinary bladder and prostate gland appear normal.  Facet arthropathy results in grade 1 anterolisthesis L5 on S1. Scattered lumbar spondylosis is noted. No lytic or sclerotic bony lesion is seen.  IMPRESSION: The study is positive for small bowel obstruction with a transition point in the distal jejunum in the pelvis. As noted on prior studies, swirled appearance of the mesentery could be due to an internal hernia.  Enlarging lesion in the right hepatic lobe cannot be definitively characterized but is worrisome for neoplasm. There may be a second smaller lesion the right hepatic lobe. MRI with and without contrast is recommended for further evaluation.  Fatty infiltration of the liver.  Aortoiliac atherosclerosis.  Status post resection of the sigmoid colon without evidence of complication.   Electronically Signed   By: Inge Rise M.D.   On: 09/26/2014 21:37   Dg Chest Portable 1 View  09/26/2014   CLINICAL DATA:  Abdominal pain with nausea and vomiting  EXAM: PORTABLE CHEST - 1 VIEW  COMPARISON:  September 05, 2013  FINDINGS: There is bullous disease in the left upper lobe. There is no  edema or consolidation. Heart size and pulmonary vascularity are normal. No adenopathy. Nasogastric tube tip and side port are below the diaphragm.  IMPRESSION: No edema or consolidation. Bullous disease left upper lobe. No change in cardiac silhouette.   Electronically Signed   By: Lowella Grip III M.D.   On: 09/26/2014 23:30   Dg Abd Portable 2v  09/27/2014   CLINICAL DATA:  Follow-up progression of contrast. Assess for small bowel obstruction.  EXAM: PORTABLE ABDOMEN - 2 VIEW  COMPARISON:  CT of the abdomen and pelvis from 09/26/2014  FINDINGS: Distention of small-bowel loops is again noted. There is no evidence for progression of contrast beyond the point of obstruction. Residual air is seen within the colon. No free intra-abdominal air is identified on the provided upright view.  Contrast is seen filling the bladder. The patient's enteric tube is seen ending overlying the body of the stomach. No acute osseous abnormalities are seen.  IMPRESSION: Distention of small-bowel loops again noted. No evidence of progression of contrast beyond the point  of obstruction, concerning for persistent relatively high-grade small bowel obstruction. Given the appearance on CT, an internal hernia cannot be excluded.  The somewhat unusual low-attenuation appearance of material within the ileum just proximal to the transition point on CT may reflect some degree of underlying clot.   Electronically Signed   By: Garald Balding M.D.   On: 09/27/2014 06:53    Assessment/Plan: I have reviewed his CT scan is plain films. In this setting with high-grade obstruction in the site of the previous internal hernia I would be concerned about the possibility of bowel compromise. I would want to consider surgical intervention. I talk with him about that option. He is understandably reluctant to have [place] operate on him again with complications in the past. I've asked him to discussed situation with his family. We recommended surgical  intervention today and if he is unable to agree to surgery here we would recommend transferred [facility] one [facility]. He will also need further evaluation of his liver nodule probably with percutaneous biopsy.

## 2014-09-27 NOTE — Transfer of Care (Signed)
Immediate Anesthesia Transfer of Care Note  Patient: Samuel Hood.  Procedure(s) Performed: Procedure(s): EXPLORATORY LAPAROTOMY (N/A)  Patient Location: PACU  Anesthesia Type:General  Level of Consciousness: awake  Airway & Oxygen Therapy: Patient Spontanous Breathing  Post-op Assessment: Report given to RN  Post vital signs: stable  Last Vitals:  Filed Vitals:   09/27/14 1130  BP: 153/90  Pulse: 65  Temp: 36.9 C  Resp:    Past Medical History  Diagnosis Date  . Hypertension    Past Surgical History  Procedure Laterality Date  . Colon surgery     Scheduled Meds: . heparin  5,000 Units Subcutaneous 3 times per day  . pantoprazole (PROTONIX) IV  40 mg Intravenous QHS   Continuous Infusions: . dextrose 5 % and 0.45 % NaCl with KCl 40 mEq/L 125 mL/hr at 09/27/14 0833   PRN Meds:.acetaminophen **OR** acetaminophen, fentaNYL (SUBLIMAZE) injection, hydrALAZINE, HYDROmorphone (DILAUDID) injection, ondansetron **OR** ondansetron (ZOFRAN) IV, phenol, promethazine   Complications: No apparent anesthesia complications

## 2014-09-27 NOTE — Progress Notes (Signed)
The patient has reviewed his options and elected to proceed with surgical intervention. We'll plan laparotomy for small bowel obstructions morning. Risks benefits and options have been outlined and accepted.

## 2014-09-27 NOTE — Op Note (Signed)
09/26/2014 - 09/27/2014  1:35 PM  PATIENT:  Samuel Hood.  64 y.o. male  PRE-OPERATIVE DIAGNOSIS:  Small bowel obstruction  POST-OPERATIVE DIAGNOSIS:  Small bowel obstruction  PROCEDURE:  Procedure(s): EXPLORATORY LAPAROTOMY (N/A)  SURGEON:  Surgeon(s) and Role:    * Jeanie Cooks, MD - Primary    * Nestor Lewandowsky, MD - Assisting   ASSISTANTS: [place]   ANESTHESIA:   general  EBL:  Total I/O In: 4103 [I.V.:1393] Out: 350 [Urine:250; Emesis/NG output:100]   DRAINS: none   LOCAL MEDICATIONS USED:  NONE   DISPOSITION OF SPECIMEN:  N/A   DICTATION: .Dragon Dictation with the patient in the supine position after the induction of appropriate general anesthesia the patient was prepped ChloraPrep and draped sterile towels. Midline incision was made from just above the umbilicus to just below the umbilicus and carried down to the subcutaneous tissue and cautery.  Midline fascia was identified and opened the length skin incision as was the peritoneum. There were no significant abdominal adhesions to the anterior abdominal wall. Proximal bowel was quite distended and edematous and discolored. The distal bowel was clearly decompressed. Following the bowel from the mid jejunum distally a band adhesion was identified as an internal hernia with an adhesion between 2 loops of bowel creating a tight stricture. This area was lysed without difficulty.  The bowel was then run from the ileum to the ligament of Treitz. Several other adhesions were taken down but otherwise her did not appear to be any evidence for significant obstruction or evidence for possible subsequent obstruction. The liver was palpated and I could not identify the nodule visualized on CT scan. The abdomen was copiously irrigated. Nasogastric tube was appropriately positioned. The midline fascia was closed with buried figure-of-eight sutures of 0 Maxon skin was clipped and sterile dressing applied. The patient was returned recovery  room having tolerated the procedure well. Sponge instrument needle count were correct 2 in the operating room.  PLAN OF CARE: Admit to inpatient   PATIENT DISPOSITION:  PACU - hemodynamically stable.   Dia Crawford III, MD

## 2014-09-27 NOTE — ED Notes (Signed)
Med given for bp per md order, pt laying in bed without pain or distress at this time.

## 2014-09-27 NOTE — Anesthesia Preprocedure Evaluation (Signed)
Anesthesia Evaluation  Patient identified by MRN, date of birth, ID band Patient awake    Reviewed: Allergy & Precautions, H&P , NPO status , Patient's Chart, lab work & pertinent test results, reviewed documented beta blocker date and time   History of Anesthesia Complications Negative for: history of anesthetic complications  Airway Mallampati: II  TM Distance: >3 FB Neck ROM: full    Dental no notable dental hx. (+) Edentulous Upper, Edentulous Lower   Pulmonary neg shortness of breath, neg sleep apnea, neg COPDneg recent URI, Current Smoker,  breath sounds clear to auscultation  Pulmonary exam normal       Cardiovascular Exercise Tolerance: Good hypertension, - angina- CAD, - Past MI, - Cardiac Stents and - CABG Normal cardiovascular exam- dysrhythmias - Valvular Problems/MurmursRhythm:regular Rate:Normal     Neuro/Psych negative neurological ROS  negative psych ROS   GI/Hepatic Neg liver ROS, Small bowel obstruction   Endo/Other  negative endocrine ROS  Renal/GU negative Renal ROS  negative genitourinary   Musculoskeletal   Abdominal   Peds  Hematology negative hematology ROS (+)   Anesthesia Other Findings Past Medical History:   Hypertension                                                 Reproductive/Obstetrics negative OB ROS                             Anesthesia Physical Anesthesia Plan  ASA: II  Anesthesia Plan: General and Rapid Sequence   Post-op Pain Management:    Induction:   Airway Management Planned:   Additional Equipment:   Intra-op Plan:   Post-operative Plan:   Informed Consent: I have reviewed the patients History and Physical, chart, labs and discussed the procedure including the risks, benefits and alternatives for the proposed anesthesia with the patient or authorized representative who has indicated his/her understanding and acceptance.   Dental  Advisory Given  Plan Discussed with: Anesthesiologist, CRNA and Surgeon  Anesthesia Plan Comments:         Anesthesia Quick Evaluation

## 2014-09-28 LAB — BASIC METABOLIC PANEL
Anion gap: 7 (ref 5–15)
BUN: 10 mg/dL (ref 6–20)
CO2: 29 mmol/L (ref 22–32)
Calcium: 8.7 mg/dL — ABNORMAL LOW (ref 8.9–10.3)
Chloride: 103 mmol/L (ref 101–111)
Creatinine, Ser: 0.94 mg/dL (ref 0.61–1.24)
GFR calc Af Amer: >60 mL/min (ref >60)
GFR calc non Af Amer: >60 mL/min (ref >60)
Glucose, Bld: 107 mg/dL — ABNORMAL HIGH (ref 65–99)
Potassium: 4.2 mmol/L (ref 3.5–5.1)
Sodium: 139 mmol/L (ref 135–145)

## 2014-09-28 LAB — CBC
HCT: 43.7 % (ref 40.0–52.0)
Hemoglobin: 14.9 g/dL (ref 13.0–18.0)
MCH: 31.6 pg (ref 26.0–34.0)
MCHC: 34.1 g/dL (ref 32.0–36.0)
MCV: 92.9 fL (ref 80.0–100.0)
Platelets: 226 10*3/uL (ref 150–440)
RBC: 4.7 MIL/uL (ref 4.40–5.90)
RDW: 16 % — ABNORMAL HIGH (ref 11.5–14.5)
WBC: 9.3 10*3/uL (ref 3.8–10.6)

## 2014-09-28 NOTE — Progress Notes (Signed)
1 Day Post-Op   Subjective:  He feels better this morning. He does not have any significant abdominal pain other than incisional pain. He is not nauseated. He did not have any significant fever overnight.  Vital signs in last 24 hours: Temp:  [97.5 F (36.4 C)-98.5 F (36.9 C)] 97.5 F (36.4 C) (08/13 0432) Pulse Rate:  [59-148] 74 (08/13 0432) Resp:  [14-18] 17 (08/13 0432) BP: (122-153)/(83-94) 122/83 mmHg (08/13 0432) SpO2:  [93 %-100 %] 95 % (08/13 0432) Last BM Date: 09/25/14  Intake/Output from previous day: 08/12 0701 - 08/13 0700 In: 3086 [I.V.:2996; NG/GT:90] Out: 1325 [Urine:1025; Emesis/NG output:300]  GI: He has mild incisional tenderness. Side have any significant abdominal findings. He does have active bowel sounds.  Lab Results:  CBC  Recent Labs  09/27/14 0336 09/28/14 0726  WBC 6.2 9.3  HGB 16.8 14.9  HCT 49.9 43.7  PLT 271 226   CMP     Component Value Date/Time   NA 137 09/27/2014 0336   NA 139 09/13/2013 0408   K 3.1* 09/27/2014 0336   K 3.3* 09/13/2013 0408   CL 93* 09/27/2014 0336   CL 105 09/13/2013 0408   CO2 29 09/27/2014 0336   CO2 27 09/13/2013 0408   GLUCOSE 119* 09/27/2014 0336   GLUCOSE 108* 09/13/2013 0408   BUN 15 09/27/2014 0336   BUN 4* 09/13/2013 0408   CREATININE 0.98 09/27/2014 0336   CREATININE 0.67 09/13/2013 0408   CALCIUM 9.6 09/27/2014 0336   CALCIUM 8.1* 09/13/2013 0408   PROT 8.2* 09/27/2014 0336   PROT 6.2* 09/13/2013 0408   ALBUMIN 4.0 09/27/2014 0336   ALBUMIN 1.9* 09/13/2013 0408   AST 57* 09/27/2014 0336   AST 30 09/13/2013 0408   ALT 52 09/27/2014 0336   ALT 27 09/13/2013 0408   ALKPHOS 64 09/27/2014 0336   ALKPHOS 218* 09/13/2013 0408   BILITOT 1.0 09/27/2014 0336   BILITOT 0.9 09/13/2013 0408   GFRNONAA >60 09/27/2014 0336   GFRNONAA >60 09/13/2013 0408   GFRAA >60 09/27/2014 0336   GFRAA >60 09/13/2013 0408   PT/INR No results for input(s): LABPROT, INR in the last 72  hours.  Studies/Results: Ct Abdomen Pelvis W Contrast  09/26/2014   CLINICAL DATA:  Lower abdominal pain beginning 09/25/2014 with nausea and vomiting. History of sigmoid colectomy July, 2015.  EXAM: CT ABDOMEN AND PELVIS WITH CONTRAST  TECHNIQUE: Multidetector CT imaging of the abdomen and pelvis was performed using the standard protocol following bolus administration of intravenous contrast.  CONTRAST:  100 mL OMNIPAQUE IOHEXOL 300 MG/ML  SOLN  COMPARISON:  CT abdomen and pelvis 06/18/2014 and 09/13/2013.  FINDINGS: The lung bases are clear. No pleural or pericardial effusion. Heart size is normal.  The patient has a small bowel obstruction with loops dilated to 4.0 cm and air-fluid levels in small bowel. The transition point is identified centrally in the pelvis in the distal jejunum and is best seen on image 59 of the coronal series. As noted on prior examinations, the mesentery has a swirled appearance in this area and there may be an internal hernia present. Distal small bowel loops are completely decompressed. Surgical anastomosis in the sigmoid colon is noted. No pneumatosis, portal venous gas or free air is identified. Small lymph nodes in the root of the mesenteric are likely somewhat prominent due to congestion. No pathologically enlarged nodes are seen.  The liver is low attenuating consistent with fatty infiltration. There is a focal lesion in the  right hepatic lobe which measures 1.8 cm on image 13. The lesion is barely perceptible on the most recent CT scan where it measured 1.2 cm. There may be a second lesion in the right hepatic lobe measuring 1 cm on image 17. No other focal liver lesion is identified. The adrenal glands, spleen, pancreas and left kidney are unremarkable. Two small right renal cysts are unchanged. Aortoiliac atherosclerosis without aneurysm is identified. The urinary bladder and prostate gland appear normal.  Facet arthropathy results in grade 1 anterolisthesis L5 on S1.  Scattered lumbar spondylosis is noted. No lytic or sclerotic bony lesion is seen.  IMPRESSION: The study is positive for small bowel obstruction with a transition point in the distal jejunum in the pelvis. As noted on prior studies, swirled appearance of the mesentery could be due to an internal hernia.  Enlarging lesion in the right hepatic lobe cannot be definitively characterized but is worrisome for neoplasm. There may be a second smaller lesion the right hepatic lobe. MRI with and without contrast is recommended for further evaluation.  Fatty infiltration of the liver.  Aortoiliac atherosclerosis.  Status post resection of the sigmoid colon without evidence of complication.   Electronically Signed   By: Inge Rise M.D.   On: 09/26/2014 21:37   Dg Chest Portable 1 View  09/26/2014   CLINICAL DATA:  Abdominal pain with nausea and vomiting  EXAM: PORTABLE CHEST - 1 VIEW  COMPARISON:  September 05, 2013  FINDINGS: There is bullous disease in the left upper lobe. There is no edema or consolidation. Heart size and pulmonary vascularity are normal. No adenopathy. Nasogastric tube tip and side port are below the diaphragm.  IMPRESSION: No edema or consolidation. Bullous disease left upper lobe. No change in cardiac silhouette.   Electronically Signed   By: Lowella Grip III M.D.   On: 09/26/2014 23:30   Dg Abd Portable 2v  09/27/2014   CLINICAL DATA:  Follow-up progression of contrast. Assess for small bowel obstruction.  EXAM: PORTABLE ABDOMEN - 2 VIEW  COMPARISON:  CT of the abdomen and pelvis from 09/26/2014  FINDINGS: Distention of small-bowel loops is again noted. There is no evidence for progression of contrast beyond the point of obstruction. Residual air is seen within the colon. No free intra-abdominal air is identified on the provided upright view.  Contrast is seen filling the bladder. The patient's enteric tube is seen ending overlying the body of the stomach. No acute osseous abnormalities are  seen.  IMPRESSION: Distention of small-bowel loops again noted. No evidence of progression of contrast beyond the point of obstruction, concerning for persistent relatively high-grade small bowel obstruction. Given the appearance on CT, an internal hernia cannot be excluded.  The somewhat unusual low-attenuation appearance of material within the ileum just proximal to the transition point on CT may reflect some degree of underlying clot.   Electronically Signed   By: Garald Balding M.D.   On: 09/27/2014 06:53    Assessment/Plan: Overall he seems to be improving. He feels better. We'll discontinue his NG tube hold him nothing by mouth at this point. I discussed the findings with him today.

## 2014-09-28 NOTE — Progress Notes (Signed)
Walked with pt. Pt tolerated it well.

## 2014-09-29 LAB — GLUCOSE, CAPILLARY: Glucose-Capillary: 91 mg/dL (ref 65–99)

## 2014-09-29 MED ORDER — HYDROCODONE-ACETAMINOPHEN 5-325 MG PO TABS
1.0000 | ORAL_TABLET | Freq: Four times a day (QID) | ORAL | Status: DC | PRN
  Administered 2014-09-29 – 2014-09-30 (×2): 2 via ORAL
  Filled 2014-09-29 (×2): qty 2

## 2014-09-29 MED ORDER — FAMOTIDINE 20 MG PO TABS
20.0000 mg | ORAL_TABLET | Freq: Every day | ORAL | Status: DC
  Administered 2014-09-29 – 2014-09-30 (×2): 20 mg via ORAL
  Filled 2014-09-29 (×2): qty 1

## 2014-09-29 NOTE — Anesthesia Postprocedure Evaluation (Signed)
  Anesthesia Post-op Note  Patient: Samuel Hood.  Procedure(s) Performed: Procedure(s): EXPLORATORY LAPAROTOMY (N/A)  Anesthesia type:General, Rapid Sequence  Patient location: PACU  Post pain: Pain level controlled  Post assessment: Post-op Vital signs reviewed, Patient's Cardiovascular Status Stable, Respiratory Function Stable, Patent Airway and No signs of Nausea or vomiting  Post vital signs: Reviewed and stable  Last Vitals:  Filed Vitals:   09/29/14 0855  BP: 124/82  Pulse: 69  Temp: 37.2 C  Resp:     Level of consciousness: awake, alert  and patient cooperative  Complications: No apparent anesthesia complications

## 2014-09-29 NOTE — Progress Notes (Signed)
2 Days Post-Op   Subjective:  He is feeling much better today. He has passed some gas but does not have any bowel function as yet. He has no nausea or vomiting. His pain is under good control.  Vital signs in last 24 hours: Temp:  [98.9 F (37.2 C)] 98.9 F (37.2 C) (08/14 0855) Pulse Rate:  [69] 69 (08/14 0855) BP: (124)/(82) 124/82 mmHg (08/14 0855) SpO2:  [95 %] 95 % (08/14 0855) Last BM Date: 09/25/14  Intake/Output from previous day: 08/13 0701 - 08/14 0700 In: 2418 [I.V.:2218] Out: 1425 [Urine:1425]  GI: His abdomen is soft with minimal tenderness mild incisional drainage and good bowel sounds.  Lab Results:  CBC  Recent Labs  09/27/14 0336 09/28/14 0726  WBC 6.2 9.3  HGB 16.8 14.9  HCT 49.9 43.7  PLT 271 226   CMP     Component Value Date/Time   NA 139 09/28/2014 0726   NA 139 09/13/2013 0408   K 4.2 09/28/2014 0726   K 3.3* 09/13/2013 0408   CL 103 09/28/2014 0726   CL 105 09/13/2013 0408   CO2 29 09/28/2014 0726   CO2 27 09/13/2013 0408   GLUCOSE 107* 09/28/2014 0726   GLUCOSE 108* 09/13/2013 0408   BUN 10 09/28/2014 0726   BUN 4* 09/13/2013 0408   CREATININE 0.94 09/28/2014 0726   CREATININE 0.67 09/13/2013 0408   CALCIUM 8.7* 09/28/2014 0726   CALCIUM 8.1* 09/13/2013 0408   PROT 8.2* 09/27/2014 0336   PROT 6.2* 09/13/2013 0408   ALBUMIN 4.0 09/27/2014 0336   ALBUMIN 1.9* 09/13/2013 0408   AST 57* 09/27/2014 0336   AST 30 09/13/2013 0408   ALT 52 09/27/2014 0336   ALT 27 09/13/2013 0408   ALKPHOS 64 09/27/2014 0336   ALKPHOS 218* 09/13/2013 0408   BILITOT 1.0 09/27/2014 0336   BILITOT 0.9 09/13/2013 0408   GFRNONAA >60 09/28/2014 0726   GFRNONAA >60 09/13/2013 0408   GFRAA >60 09/28/2014 0726   GFRAA >60 09/13/2013 0408   PT/INR No results for input(s): LABPROT, INR in the last 72 hours.  Studies/Results: No results found.  Assessment/Plan: He continues to improve. We will advance his diet and increase his activity level. He is in  agreement.

## 2014-09-30 ENCOUNTER — Encounter: Payer: Self-pay | Admitting: Surgery

## 2014-09-30 MED ORDER — HYDROCODONE-ACETAMINOPHEN 5-325 MG PO TABS: 1.0000 | ORAL_TABLET | Freq: Four times a day (QID) | ORAL | Status: DC | PRN

## 2014-09-30 NOTE — Discharge Instructions (Signed)
Laparotomy °Care After °Refer to this sheet in the next few weeks. These instructions provide you with information on caring for yourself after your procedure. Your caregiver may also give you more specific instructions. Your treatment has been planned according to current medical practices, but problems sometimes occur. Call your caregiver if you have any problems or questions after your procedure. °HOME CARE INSTRUCTIONS °ACTIVITY °· Rest as much as possible the first two weeks at home. °· Avoid strenuous activity such as heavy lifting (more than 10 pounds), pushing, or pulling. Limit stair climbing to once or twice a day for the first week, then slowly increase this activity. °· Take frequent rest periods throughout the day. °· Talk with your caregiver about when you may resume your usual physical activity. °· You need to be out of bed and walking as much as possible. This decreases the chance of: °¨ Blood clots. °¨ Pneumonia. °NUTRITION °· You can resume your normal diet once you regain bowel function. °· Drink plenty of fluids (6-8 glasses a day or as instructed by your caregiver). °· Eat a well-balanced diet. °· Daily portions of food from the meat (protein), milk, vegetable, and bread groups are necessary for your health. °ELIMINATION °It is very important not to strain during bowel movements. If constipation should occur, you may: °· Take a mild laxative. °· Add fruit and bran to your diet. °· Drink more fluids. °HYGIENE °· Take showers, not baths, until 4-6 weeks after surgery. °· If your incision is closed, you may take a shower or tub bath. °FEVER °If you feel feverish or have shaking chills, take your temperature. If it is 102° F (38.9° C), call your caregiver. The fever may mean there is an infection. °PAIN CONTROL °· Mild discomfort may occur. °· Only take over-the-counter or prescription medicines for pain, discomfort, or fever as directed by your caregiver. Take any prescribed medicines exactly as  directed. °INCISION CARE °· Keep your incision site clean with soap and water. °· Do not use a dressing unless your cut (incision) from surgery is draining or irritated. °· If you have small adhesive strips in place and they do not fall off within 10 days, carefully peel them off. °· Check your incision and surrounding area daily for any redness, swelling, discoloration, heavy drainage, or separation of the skin. °SEXUAL INTERCOURSE °Do not have sexual intercourse until after your follow-up appointment, unless your caregiver tells you otherwise. °SEEK MEDICAL CARE IF:  °· You are unable to tolerate food or drinks. °· You are unable to pass gas or have a bowel movement. °· Your pain becomes more severe or is not relieved with medicines. °· You have redness, swelling, discoloration, heavy drainage, or separation of the skin at the incision site. °Document Released: 09/16/2003 Document Revised: 01/19/2012 Document Reviewed: 01/31/2007 °ExitCare® Patient Information ©2015 [employer]. This information is not intended to replace advice given to you by your health care provider. Make sure you discuss any questions you have with your health care provider. ° °

## 2014-09-30 NOTE — Discharge Summary (Signed)
Patient ID: Samuel Hood. MRN: [id number] DOB/AGE: [id number] 62 y.o.  Admit date: 09/26/2014 Discharge date: 09/30/2014  Discharge Diagnoses:  Small Bowel Obstruction   Procedures Performed: Exploratory Laparotomy with Lysis of Adhesions   Discharged Condition: good  Hospital Course: Admitted for bowel obstruction. Underwent lysis of adhesions with good results. Tolerated PO with return of bowel function prior to discharge.  Discharge Orders:   Disposition: 01-Home or Self Care  Discharge Medications:  Current facility-administered medications:  .  acetaminophen (TYLENOL) tablet 650 mg, 650 mg, Oral, Q6H PRN **OR** acetaminophen (TYLENOL) suppository 650 mg, 650 mg, Rectal, Q6H PRN, Marlyce Huge, MD .  dextrose 5 % and 0.45 % NaCl with KCl 40 mEq/L infusion, , Intravenous, Continuous, Dia Crawford III, MD, Last Rate: 50 mL/hr at 09/30/14 0551 .  famotidine (PEPCID) tablet 20 mg, 20 mg, Oral, Daily, Dia Crawford III, MD, 20 mg at 09/30/14 1029 .  heparin injection 5,000 Units, 5,000 Units, Subcutaneous, 3 times per day, Marlyce Huge, MD, 5,000 Units at 09/30/14 0551 .  hydrALAZINE (APRESOLINE) injection 10 mg, 10 mg, Intravenous, Q4H PRN, Lytle Butte, MD .  HYDROcodone-acetaminophen (NORCO/VICODIN) 5-325 MG per tablet 1-2 tablet, 1-2 tablet, Oral, Q6H PRN, Dia Crawford III, MD, 2 tablet at 09/30/14 0551 .  HYDROmorphone (DILAUDID) injection 0.25-0.5 mg, 0.25-0.5 mg, Intravenous, Q5 min PRN, Andria Frames, MD, 0.5 mg at 09/27/14 1435 .  HYDROmorphone (DILAUDID) injection 1 mg, 1 mg, Intravenous, Q2H PRN, Dia Crawford III, MD, 1 mg at 09/29/14 1819 .  ondansetron (ZOFRAN-ODT) disintegrating tablet 4 mg, 4 mg, Oral, Q6H PRN **OR** ondansetron (ZOFRAN) injection 4 mg, 4 mg, Intravenous, Q6H PRN, Marlyce Huge, MD, 4 mg at 09/27/14 1256 .  phenol (CHLORASEPTIC) mouth spray 1 spray, 1 spray, Mouth/Throat, PRN, Marlyce Huge, MD  Follwup: in clinic in 2  weeks Follow-up Information    Follow up with [facility]. Schedule an appointment as soon as possible for a visit in 2 weeks.   Why:  For wound re-check   Contact information:   [place] [id number] [phone]      Signed: Clayburn Pert 09/30/2014, 12:33 PM

## 2014-09-30 NOTE — Progress Notes (Signed)
Subjective: 64 y/o male POD#3 s/p ex-lap with lysis of adhesions. He states he has continued to improve. Passed flatus and had BM yesterday. Has not had much PO yet. But denies and nausea or vomiting. He is interested in going home if he can tolerated his breakfast/lunch.  Vital signs in last 24 hours: Temp:  [97.6 F (36.4 C)-98.2 F (36.8 C)] 97.6 F (36.4 C) (08/15 0823) Pulse Rate:  [62-94] 62 (08/15 0823) Resp:  [16] 16 (08/15 0025) BP: (125-153)/(78-96) 125/78 mmHg (08/15 0823) SpO2:  [97 %-99 %] 99 % (08/15 0823) Last BM Date: 09/25/14  Intake/Output from previous day: 08/14 0701 - 08/15 0700 In: 2438.9 [P.O.:960; I.V.:1478.9] Out: 2800 [Urine:2800]  Exam:  GEN: NAD RESP: CTA CV: CTA ABD: Soft, Appropriately TTP around incision site, nondistended with bowel sounds present.  Lab Results:  CBC  Recent Labs  09/28/14 0726  WBC 9.3  HGB 14.9  HCT 43.7  PLT 226   CMP     Component Value Date/Time   NA 139 09/28/2014 0726   NA 139 09/13/2013 0408   K 4.2 09/28/2014 0726   K 3.3* 09/13/2013 0408   CL 103 09/28/2014 0726   CL 105 09/13/2013 0408   CO2 29 09/28/2014 0726   CO2 27 09/13/2013 0408   GLUCOSE 107* 09/28/2014 0726   GLUCOSE 108* 09/13/2013 0408   BUN 10 09/28/2014 0726   BUN 4* 09/13/2013 0408   CREATININE 0.94 09/28/2014 0726   CREATININE 0.67 09/13/2013 0408   CALCIUM 8.7* 09/28/2014 0726   CALCIUM 8.1* 09/13/2013 0408   PROT 8.2* 09/27/2014 0336   PROT 6.2* 09/13/2013 0408   ALBUMIN 4.0 09/27/2014 0336   ALBUMIN 1.9* 09/13/2013 0408   AST 57* 09/27/2014 0336   AST 30 09/13/2013 0408   ALT 52 09/27/2014 0336   ALT 27 09/13/2013 0408   ALKPHOS 64 09/27/2014 0336   ALKPHOS 218* 09/13/2013 0408   BILITOT 1.0 09/27/2014 0336   BILITOT 0.9 09/13/2013 0408   GFRNONAA >60 09/28/2014 0726   GFRNONAA >60 09/13/2013 0408   GFRAA >60 09/28/2014 0726   GFRAA >60 09/13/2013 0408   PT/INR No results for input(s): LABPROT, INR in the last 72  hours.  Studies/Results: No results found.  Assessment/Plan: 64 y/o male s/p lysis of adhesions for SBO Doing well, plan to discharge home once tolerating PO  Clayburn Pert, MD [facility] General Surgeon [facility]

## 2014-09-30 NOTE — Progress Notes (Signed)
A&O. VSS. Tolerating diet well. No complaints of pain or nausea. Resting comfortably. Dressing intact with some old drainage present. Discharged per MD orders. Prescription given to pt via MD. IV removed per policy. Belongings returned to pt from security. Discharge via wheelchair escorted by auxilary.

## 2014-10-14 ENCOUNTER — Ambulatory Visit (INDEPENDENT_AMBULATORY_CARE_PROVIDER_SITE_OTHER): Payer: No Typology Code available for payment source | Admitting: Surgery

## 2014-10-14 ENCOUNTER — Encounter: Payer: Self-pay | Admitting: *Deleted

## 2014-10-14 VITALS — BP 116/83 | HR 91 | Temp 98.4°F | Ht 66.0 in | Wt 138.0 lb

## 2014-10-14 DIAGNOSIS — K565 Intestinal adhesions [bands], unspecified as to partial versus complete obstruction: Secondary | ICD-10-CM

## 2014-10-14 MED ORDER — HYDROCODONE-ACETAMINOPHEN 5-325 MG PO TABS: 1.0000 | ORAL_TABLET | Freq: Four times a day (QID) | ORAL | Status: DC | PRN

## 2014-10-14 NOTE — Progress Notes (Signed)
Outpatient Surgical Follow Up  10/14/2014  Samuel Hood. is an 64 y.o. male.   Chief Complaint  Patient presents with  . Follow-up    abdominal wound resolution    HPI: He is now about 2 weeks status post his lysis of adhesions. He had a significant small bowel obstruction with an internal hernia and a small bowel volvulus. He's doing very well post surgery. He does not have any complaints at the present time. He is eating well with good bowel function.  Past Medical History  Diagnosis Date  . Hypertension   . Small bowel obstruction     Past Surgical History  Procedure Laterality Date  . Colon surgery    . Laparotomy N/A 09/27/2014    Procedure: EXPLORATORY LAPAROTOMY;  Surgeon: Dia Crawford III, MD;  Location: [facility] ORS;  Service: General;  Laterality: N/A;    Family History  Problem Relation Age of Onset  . Cancer Mother   . Cancer Father     Social History:  reports that he has been smoking.  He has never used smokeless tobacco. He reports that he drinks alcohol. He reports that he does not use illicit drugs.  Allergies: No Known Allergies  Medications reviewed.    ROS    BP 116/83 mmHg  Pulse 91  Temp(Src) 98.4 F (36.9 C) (Oral)  Ht '5\' 6"'$  (1.676 m)  Wt 138 lb (62.596 kg)  BMI 22.28 kg/m2  Physical Exam's abdomen is good. Clips were removed. He has no significant abdominal tenderness. He has active bowel sounds.     No results found for this or any previous visit (from the past 48 hour(s)). No results found.  Assessment/Plan:  1. Small bowel obstruction due to adhesions Overall is doing quite well post surgery. We'll liberalize his activity. I did refill his pain medicine prescription. We will see him back as necessary.     Dia Crawford III  10/14/2014,negative

## 2016-03-19 HISTORY — PX: OTHER SURGICAL HISTORY: SHX169

## 2016-03-26 DIAGNOSIS — I10 Essential (primary) hypertension: Secondary | ICD-10-CM | POA: Insufficient documentation

## 2017-07-26 ENCOUNTER — Emergency Department
Admission: EM | Admit: 2017-07-26 | Discharge: 2017-07-26 | Disposition: A | Discharge status: Home / Self Care | Payer: Medicare Other | Attending: Emergency Medicine | Admitting: Emergency Medicine

## 2017-07-26 ENCOUNTER — Emergency Department: Payer: Medicare Other

## 2017-07-26 ENCOUNTER — Encounter: Payer: Self-pay | Admitting: Emergency Medicine

## 2017-07-26 ENCOUNTER — Other Ambulatory Visit: Payer: Self-pay

## 2017-07-26 DIAGNOSIS — K7689 Other specified diseases of liver: Secondary | ICD-10-CM

## 2017-07-26 DIAGNOSIS — K769 Liver disease, unspecified: Secondary | ICD-10-CM | POA: Diagnosis not present

## 2017-07-26 DIAGNOSIS — R0789 Other chest pain: Secondary | ICD-10-CM | POA: Diagnosis not present

## 2017-07-26 DIAGNOSIS — Z79899 Other long term (current) drug therapy: Secondary | ICD-10-CM | POA: Diagnosis not present

## 2017-07-26 DIAGNOSIS — I10 Essential (primary) hypertension: Secondary | ICD-10-CM | POA: Diagnosis not present

## 2017-07-26 DIAGNOSIS — H00019 Hordeolum externum unspecified eye, unspecified eyelid: Secondary | ICD-10-CM | POA: Insufficient documentation

## 2017-07-26 DIAGNOSIS — R911 Solitary pulmonary nodule: Secondary | ICD-10-CM | POA: Diagnosis not present

## 2017-07-26 DIAGNOSIS — R0602 Shortness of breath: Secondary | ICD-10-CM | POA: Insufficient documentation

## 2017-07-26 DIAGNOSIS — I200 Unstable angina: Secondary | ICD-10-CM | POA: Diagnosis not present

## 2017-07-26 DIAGNOSIS — J948 Other specified pleural conditions: Secondary | ICD-10-CM | POA: Diagnosis not present

## 2017-07-26 DIAGNOSIS — F17200 Nicotine dependence, unspecified, uncomplicated: Secondary | ICD-10-CM | POA: Diagnosis not present

## 2017-07-26 DIAGNOSIS — J9690 Respiratory failure, unspecified, unspecified whether with hypoxia or hypercapnia: Secondary | ICD-10-CM | POA: Diagnosis not present

## 2017-07-26 DIAGNOSIS — R079 Chest pain, unspecified: Secondary | ICD-10-CM | POA: Diagnosis not present

## 2017-07-26 LAB — BASIC METABOLIC PANEL
Anion gap: 11 (ref 5–15)
BUN: 12 mg/dL (ref 6–20)
CO2: 26 mmol/L (ref 22–32)
Calcium: 9.7 mg/dL (ref 8.9–10.3)
Chloride: 101 mmol/L (ref 101–111)
Creatinine, Ser: 0.76 mg/dL (ref 0.61–1.24)
GFR calc Af Amer: >60 mL/min (ref >60)
GFR calc non Af Amer: >60 mL/min (ref >60)
Glucose, Bld: 110 mg/dL — ABNORMAL HIGH (ref 65–99)
Potassium: 4 mmol/L (ref 3.5–5.1)
Sodium: 138 mmol/L (ref 135–145)

## 2017-07-26 LAB — TROPONIN I: Troponin I: <0.03 ng/mL (ref <0.03)

## 2017-07-26 LAB — CBC
HCT: 47.1 % (ref 40.0–52.0)
Hemoglobin: 16.5 g/dL (ref 13.0–18.0)
MCH: 33.8 pg (ref 26.0–34.0)
MCHC: 35 g/dL (ref 32.0–36.0)
MCV: 96.5 fL (ref 80.0–100.0)
Platelets: 256 10*3/uL (ref 150–440)
RBC: 4.88 MIL/uL (ref 4.40–5.90)
RDW: 14.6 % — ABNORMAL HIGH (ref 11.5–14.5)
WBC: 5.4 10*3/uL (ref 3.8–10.6)

## 2017-07-26 MED ORDER — IOHEXOL 300 MG/ML  SOLN
75.0000 mL | Freq: Once | INTRAMUSCULAR | Status: AC | PRN
  Administered 2017-07-26: 75 mL via INTRAVENOUS

## 2017-07-26 NOTE — ED Notes (Signed)
PA at bedside.

## 2017-07-26 NOTE — ED Notes (Signed)
Pt reports he is no longer having pain at this time. Pain lasted 15 minutes yesterday with a dry cough that has become chronic with smoking.

## 2017-07-26 NOTE — ED Triage Notes (Addendum)
Pt arrived via POV with reports of chest pain since Monday at 1:30am in the center of the chest.  Pt states he took 1 500mg  ASA which relieved the pain briefly.  Pt states he also had shortness of breath when he had the chest pain.  Pt states he is feeling better, but girlfriend made him come get checked out.    Pt also c/o left stye x 2 days

## 2017-07-26 NOTE — ED Provider Notes (Signed)
[facility] Emergency Department Provider Note  ____________________________________________  Time seen: Approximately 12:02 PM  I have reviewed the triage vital signs and the nursing notes.   HISTORY  Chief Complaint Chest Pain and Stye   HPI Brodric Schauer. is a 67 y.o. male with a history of hypertension, smoking, and alcohol who presents for evaluation of shortness of breath.  Patient reports that yesterday, while sleeping patient started to cough and then had mild difficulty breathing.  He laid in his recliner and took a full dose of aspirin and 20 minutes later he was back to normal.   He denies having any chest pain or pressure at that time.   When he woke up this morning he was still feeling back to normal however his wife wanted him to be checked.  He went to see his primary care doctor who sent him over here. Patient does report a chronic cough which is unchanged from baseline.  No fever or chills.  Patient reports having nausea and one episode of diarrhea yesterday during the day however that has resolved as well.  No abdominal pain, no vomiting.  Patient denies personal or family history of heart attacks, PE or DVT.  No recent travel immobilization, no leg pain or swelling, no hemoptysis, no exogenous hormones, no history of cancer.  Past Medical History:  Diagnosis Date  . Hypertension   . Small bowel obstruction [facility])     Patient Active Problem List   Diagnosis Date Noted  . Small bowel obstruction ([place])   . Intestinal obstruction ([place]) 09/26/2014  . Essential hypertension 06/19/2014  . Hypokalemia 06/19/2014  . Bowel obstruction ([place]) 06/18/2014    Past Surgical History:  Procedure Laterality Date  . COLON SURGERY    . LAPAROTOMY N/A 09/27/2014   Procedure: EXPLORATORY LAPAROTOMY;  Surgeon: Dia Crawford III, MD;  Location: [facility] ORS;  Service: General;  Laterality: N/A;    Prior to Admission medications   Medication Sig Start Date End Date  Taking? Authorizing Provider  aspirin EC 325 MG tablet Take 650 mg by mouth every 4 (four) hours as needed for mild pain.     [provider]  hydrochlorothiazide (HYDRODIURIL) 25 MG tablet Take 25 mg by mouth daily.    [provider]  HYDROcodone-acetaminophen (NORCO) 5-325 MG per tablet Take 1-2 tablets by mouth every 6 (six) hours as needed for moderate pain. 10/14/14   Jeanie Cooks, MD  HYDROcodone-acetaminophen (NORCO/VICODIN) 5-325 MG per tablet Take 1-2 tablets by mouth every 6 (six) hours as needed for moderate pain. 09/30/14   Clayburn Pert, MD  ibuprofen (ADVIL,MOTRIN) 200 MG tablet Take 400 mg by mouth every 6 (six) hours as needed for mild pain.     [provider]    Allergies Patient has no known allergies.  Family History  Problem Relation Age of Onset  . Cancer Mother   . Cancer Father     Social History Social History   Tobacco Use  . Smoking status: Current Every Day Smoker  . Smokeless tobacco: Never Used  Substance Use Topics  . Alcohol use: Yes  . Drug use: No    Review of Systems  Constitutional: Negative for fever. Eyes: Negative for visual changes. ENT: Negative for sore throat. Neck: No neck pain  Cardiovascular: Negative for chest pain. Respiratory: + shortness of breath. Gastrointestinal: Negative for abdominal pain, vomiting. + nausea and diarrhea. Genitourinary: Negative for dysuria. Musculoskeletal: Negative for back pain. Skin: Negative  for rash. Neurological: Negative for headaches, weakness or numbness. Psych: No SI or HI  ____________________________________________   PHYSICAL EXAM:  VITAL SIGNS: ED Triage Vitals [07/26/17 1039]  Enc Vitals Group     BP (S) (!) 136/102     Pulse Rate 76     Resp 16     Temp 98 F (36.7 C)     Temp Source Oral     SpO2 100 %     Weight 145 lb (65.8 kg)     Height 5\' 5"  (1.651 m)     Head Circumference      Peak Flow      Pain Score 0     Pain Loc      Pain  Edu?      Excl. in [place]?     Constitutional: Alert and oriented. Well appearing and in no apparent distress. HEENT:      Head: Normocephalic and atraumatic.         Eyes: Conjunctivae are normal. Sclera is non-icteric.       Mouth/Throat: Mucous membranes are moist.       Neck: Supple with no signs of meningismus. Cardiovascular: Regular rate and rhythm. No murmurs, gallops, or rubs. 2+ symmetrical distal pulses are present in all extremities. No JVD. Respiratory: Normal respiratory effort. Lungs are clear to auscultation bilaterally. No wheezes, crackles, or rhonchi.  Gastrointestinal: Soft, non tender, and non distended with positive bowel sounds. No rebound or guarding. Musculoskeletal: Nontender with normal range of motion in all extremities. No edema, cyanosis, or erythema of extremities. Neurologic: Normal speech and language. Face is symmetric. Moving all extremities. No gross focal neurologic deficits are appreciated. Skin: Skin is warm, dry and intact. No rash noted. Psychiatric: Mood and affect are normal. Speech and behavior are normal.  ____________________________________________   LABS (all labs ordered are listed, but only abnormal results are displayed)  Labs Reviewed  BASIC METABOLIC PANEL - Abnormal; Notable for the following components:      Result Value   Glucose, Bld 110 (*)    All other components within normal limits  CBC - Abnormal; Notable for the following components:   RDW 14.6 (*)    All other components within normal limits  TROPONIN I   ____________________________________________  EKG  ED ECG REPORT I, Rudene Re, the attending physician, personally viewed and interpreted this ECG.  Normal sinus rhythm, rate of 83, normal intervals, normal axis, no ST elevations or depressions, anterior Q waves. Unchanged from prior ____________________________________________  RADIOLOGY  I have personally reviewed the images performed during this visit  and I agree with the Radiologist's read.   Interpretation by Radiologist:  Dg Chest 2 View  Result Date: 07/26/2017 CLINICAL DATA:  Chest pain and shortness of Breath EXAM: CHEST - 2 VIEW COMPARISON:  09/26/2014 FINDINGS: Cardiac shadows within normal limits. The lungs are well aerated bilaterally. A small air-fluid collection is noted along the apex of the left lung likely related to the known bulla in the left upper lobe. Emphysematous changes are noted in the left upper lobe as well. Some density is noted below the emphysematous changes likely related to scarring although appears more prominent than that seen on the prior exam. No bony abnormality is seen. The right lung is clear. No sizable effusion is noted. IMPRESSION: Emphysematous changes with a focal bulla with fluid within in the left apex. Additionally some increase in soft tissue density in the left upper lobe is noted likely related to scarring  although the possibility of an underlying lesion could not be totally excluded. CT of the chest when able would be helpful. Electronically Signed   By: Inez Catalina M.D.   On: 07/26/2017 11:13   Ct Chest W Contrast  Result Date: 07/26/2017 CLINICAL DATA:  Shortness of breath. Abnormal chest x-ray. Heart failure. EXAM: CT CHEST WITH CONTRAST TECHNIQUE: Multidetector CT imaging of the chest was performed during intravenous contrast administration. CONTRAST:  98mL OMNIPAQUE IOHEXOL 300 MG/ML  SOLN COMPARISON:  CT AP 09/26/2014 FINDINGS: Cardiovascular: The heart size is within normal limits. Aortic atherosclerosis. Calcifications within the LAD, left circumflex and RCA coronary arteries noted. Mediastinum/Nodes: The thyroid gland appears normal. Normal appearance of the trachea. No enlarged supraclavicular or axillary lymph nodes. No mediastinal or hilar adenopathy. Lungs/Pleura: No pleural effusion. Advanced changes of bullous emphysema identified. There is extensive pleuroparenchymal scarring within the  left lung apex. Within this area there are several irregular nodular densities. The largest is within the subpleural lateral left upper lobe measuring 1.3 x 1.6 by 1.2 cm, image 43/3 and image 30/5. There is a small overlying loculated hydropneumothorax which measures approximately 6.0 x 2.0 by 2.3 cm. Upper Abdomen: Within the central portion of the liver there is a low-density lesion measuring 3.0 x 2.9 cm, image 140/2. This contains several internal areas of hyperattenuating septation and possible mural nodularity. On the most recent comparison exam from 09/26/2014 this measured 1.8 x 1.6 cm. Etiology is indeterminate. Within the upper pole of right kidney there is a lesion measuring 2.1 cm and 39 HU. On the previous exam this measured 1.9 cm and 41 HU. Musculoskeletal: Spondylosis noted within the thoracic spine. IMPRESSION: 1. Extensive pleuroparenchymal scarring within the left upper lobe with several areas of soft tissue nodularity measuring up to 1.6 cm. In this patient who is at increased risk for lung cancer further investigation with PET-CT is recommended. 2. Small chronic appearing loculated hydropneumothorax overlies the left apex. 3. Slowly enlarging lesion within the central portion of the right lobe of liver identified. Recommend more definitive assessment with contrast enhanced liver protocol MRI. 4. Advanced changes of bullous emphysema. 5. Aortic atherosclerosis and 3 vessel coronary artery atherosclerotic calcifications. Electronically Signed   By: Kerby Moors M.D.   On: 07/26/2017 13:02     ____________________________________________   PROCEDURES  Procedure(s) performed: None Procedures Critical Care performed:  None ____________________________________________   INITIAL IMPRESSION / ASSESSMENT AND PLAN / ED COURSE   67 y.o. male with a history of hypertension, smoking, and alcohol who presents for evaluation of a brief episode of shortness of breath preceded by a coughing  spell last night. Patient completely asymptomatic in the ED. Marland Kitchen EKG with no evidence of ischemia, troponin x 1 negative. Since patient has been completely asymptomatic for more than 12 hours, no need to repeat 2nd troponin. CXR showing no PNA or PTX. Does show emphysematous changes and questionable abnormality in the LUL. CT has been ordered to better evaluate this finding. Will monitor patient closely on telemetry.     _________________________ 1:17 PM on 07/26/2017 -----------------------------------------  CT scan with several findings including scarring and nodularity seen in the left upper lobe for which radiology recommended a PET scan as an outpatient to rule out malignancy.  Also an enlarging nodule in the liver who patient will need an MRI for further evaluation.  Patient was also found to have a small chronic left hydropneumothorax.  I discussed these findings with Dr. Genevive Bi, cardiothoracic surgeon on-call who looked  at the CT and recommended outpatient monitoring but no indication for admission or any further management in the emergency department.  All of these findings were discussed with the patient and I urged patient to follow-up with his primary care doctor within a week for further evaluation of lung and liver nodules to rule out cancer.  I discussed return precautions with patient.  Counseling for smoke cessation was also provided.   As part of my medical decision making, I reviewed the following data within the [facility] notes reviewed and incorporated, Labs reviewed , EKG interpreted , Old chart reviewed, Radiograph reviewed , A consult was requested and obtained from this/these consultant(s) Cardiothoracic surgery, Notes from prior ED visits and [place] Controlled Substance Database    Pertinent labs & imaging results that were available during my care of the patient were reviewed by me and considered in my medical decision making (see chart for  details).    ____________________________________________   FINAL CLINICAL IMPRESSION(S) / ED DIAGNOSES  Final diagnoses:  Shortness of breath  Hydropneumothorax  Lung nodule  Liver nodule      NEW MEDICATIONS STARTED DURING THIS VISIT:  ED Discharge Orders    None       Note:  This document was prepared using Dragon voice recognition software and may include unintentional dictation errors.    Alfred Levins, [place], MD 07/26/17 [id number]

## 2017-07-26 NOTE — Discharge Instructions (Addendum)
As I explained to you, your CT scan showed several abnormalities.  It showed a nodule in your left lung and one in your liver.  Both of these nodules need to be evaluated to rule out cancer.  Make sure to see your primary care doctor within a week for further evaluation of these nodules.  It also found a small area of collapsed lung on the left.  You should follow-up with Dr. Genevive Bi for further evaluation of this finding.  Return to the emergency room if you develop chest pain or shortness of breath, fever or chills, or any other symptoms concerning to you. Your lungs have very severe changes from smoking and it is important for you to stop smoking or at least reduce the number of cigarettes to smoke a day.

## 2017-07-26 NOTE — ED Notes (Signed)
MD at bedside with PA student and Pt.

## 2017-07-26 NOTE — ED Notes (Signed)
Patient transported to CT 

## 2017-08-01 ENCOUNTER — Ambulatory Visit: Payer: Medicare Other | Admitting: Internal Medicine

## 2017-08-01 ENCOUNTER — Encounter: Payer: Self-pay | Admitting: Internal Medicine

## 2017-08-01 ENCOUNTER — Ambulatory Visit
Admission: RE | Admit: 2017-08-01 | Discharge: 2017-08-01 | Disposition: A | Discharge status: Home / Self Care | Payer: Medicare Other | Source: Ambulatory Visit | Attending: Internal Medicine | Admitting: Internal Medicine

## 2017-08-01 ENCOUNTER — Other Ambulatory Visit
Admission: RE | Admit: 2017-08-01 | Discharge: 2017-08-01 | Disposition: A | Discharge status: Home / Self Care | Payer: Medicare Other | Source: Ambulatory Visit | Attending: Internal Medicine | Admitting: Internal Medicine

## 2017-08-01 VITALS — BP 106/60 | HR 89 | Resp 16 | Ht 65.0 in | Wt 139.0 lb

## 2017-08-01 DIAGNOSIS — J439 Emphysema, unspecified: Secondary | ICD-10-CM | POA: Insufficient documentation

## 2017-08-01 DIAGNOSIS — J984 Other disorders of lung: Secondary | ICD-10-CM | POA: Diagnosis not present

## 2017-08-01 DIAGNOSIS — R918 Other nonspecific abnormal finding of lung field: Secondary | ICD-10-CM | POA: Diagnosis not present

## 2017-08-01 DIAGNOSIS — R911 Solitary pulmonary nodule: Secondary | ICD-10-CM

## 2017-08-01 DIAGNOSIS — J851 Abscess of lung with pneumonia: Secondary | ICD-10-CM

## 2017-08-01 MED ORDER — AMOXICILLIN-POT CLAVULANATE 875-125 MG PO TABS: 1.0000 | ORAL_TABLET | Freq: Two times a day (BID) | ORAL | 0 refills | Status: AC

## 2017-08-01 MED ORDER — NICOTINE 21 MG/24HR TD PT24: 21.0000 mg | MEDICATED_PATCH | TRANSDERMAL | 1 refills | Status: DC

## 2017-08-01 NOTE — Progress Notes (Signed)
[facility] Consultation      Assessment and Plan:  Lung abscess/infected bulla. -Patient has 1 left lung bulla which may be fluid-filled versus thickened pleura. - This seemed to coincide with a possible aspiration episode, patient has a history of excess alcohol intake. - We will give a 4-week course of antibiotic for possible lung abscess/infected bulla.  Repeat chest x-ray 4 weeks later in follow-up.  Severe bullous emphysema. -Predominantly affecting the left upper lobe with scarring. - Patient is not symptomatic at this time, therefore there is no need for inhalers, we discussed that the best thing he can do for his breathing and lung health is to stop smoking. - Due to presence of severe bullae, will avoid pulmonary function testing. - Will check alpha-1 screening.  Nicotine abuse. - Ongoing nicotine abuse of about 1 pack/day, discussed the importance of smoke cessation, spent 4 minutes in discussion. - He is considering quitting and would like to start nicotine patches, these have been prescribed.  Orders Placed This Encounter  Procedures  . DG Chest 2 View  . QuantiFERON-TB Gold Plus  . Angiotensin converting enzyme   Return in about 2 months (around 10/10/2017) for after Chest Xray..    Date: 08/01/2017  MRN# [id number] Samuel Hood. Oct 24, 1950    [name]. is a 67 y.o. old male seen in consultation for chief complaint of:    Chief Complaint  Patient presents with  . Consult    referred by Dr. Veneta Penton for abn Ct scan.  . Shortness of Breath    exp sob while sleeping once. He is currently on Bactrim. Patient is a smoker and is trying to quit.    HPI:   67 y.o. male with a history of hypertension, smoking, and alcohol he presented to the ED on 07/26/2017 with an acute episode of dyspnea and chest pain, coughing.  He underwent a chest x-ray, followed by CT chest which showed abnormal scarring the left upper lobe, subsequently referred  for outpatient follow-up with pulmonary.  He notes that about 1 week ago he woke up and he could not breathe. He denies dyspnea on exertion, he does not feel winded when mowing his lawn with a riding mower and gardens. He is smoking about 1 ppd, he is thinking of quitting, and he is working on that.  He denies reflux, denies sinus drainage.  He has no pets. No travel or gone outside the country, no Tb exposure.  He drinks gin, 2 drinks per day, and a few beers per day. 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     **CT chest 07/26/2017>> images personally reviewed: Severe bullous apical emphysema, worse in the left apex with pleural parenchymal scarring peripherally. One of the apical bullae appears to have a fluid level.   PMHX:   Past Medical History:  Diagnosis Date  . Hypertension   . Small bowel obstruction ([place])    Surgical Hx:  Past Surgical History:  Procedure Laterality Date  . COLON SURGERY    . LAPAROTOMY N/A 09/27/2014   Procedure: EXPLORATORY LAPAROTOMY;  Surgeon: Dia Crawford III, MD;  Location: [facility] ORS;  Service: General;  Laterality: N/A;   Family Hx:  Family History  Problem Relation Age of Onset  . Cancer Mother   . Cancer Father    Social Hx:   Social History   Tobacco Use  . Smoking status: Current Every  Day Smoker    Packs/day: 1.00    Years: 50.00    Pack years: 50.00  . Smokeless tobacco: Never Used  Substance Use Topics  . Alcohol use: Yes  . Drug use: No   Medication:    Current Outpatient Medications:  .  aspirin EC 325 MG tablet, Take 650 mg by mouth every 4 (four) hours as needed for mild pain. , Disp: , Rfl:  .  hydrochlorothiazide (HYDRODIURIL) 25 MG tablet, Take 25 mg by mouth daily., Disp: , Rfl:  .  HYDROcodone-acetaminophen (NORCO) 5-325 MG per tablet, Take 1-2 tablets by mouth every 6 (six) hours as needed for moderate pain., Disp: 30 tablet, Rfl: 0 .  HYDROcodone-acetaminophen (NORCO/VICODIN)  5-325 MG per tablet, Take 1-2 tablets by mouth every 6 (six) hours as needed for moderate pain., Disp: 30 tablet, Rfl: 0 .  ibuprofen (ADVIL,MOTRIN) 200 MG tablet, Take 400 mg by mouth every 6 (six) hours as needed for mild pain. , Disp: , Rfl:  .  sulfamethoxazole-trimethoprim (BACTRIM DS,SEPTRA DS) 800-160 MG tablet, Take 1 tablet by mouth 2 (two) times daily. for 10 days, Disp: , Rfl: 0   Allergies:  Patient has no known allergies.  Review of Systems: Gen:  Denies  fever, sweats, chills HEENT: Denies blurred vision, double vision. bleeds, sore throat Cvc:  No dizziness, chest pain. Resp:   Denies cough or sputum production, shortness of breath Gi: Denies swallowing difficulty, stomach pain. Gu:  Denies bladder incontinence, burning urine Ext:   No Joint pain, stiffness. Skin: No skin rash,  hives  Endoc:  No polyuria, polydipsia. Psych: No depression, insomnia. Other:  All other systems were reviewed with the patient and were negative other that what is mentioned in the HPI.   Physical Examination:   VS: BP 106/60 (BP Location: Left Arm, Cuff Size: Normal)   Pulse 89   Resp 16   Ht 5\' 5"  (1.651 m)   Wt 139 lb (63 kg)   SpO2 96%   BMI 23.13 kg/m   General Appearance: No distress  Neuro:without focal findings,  speech normal,  HEENT: PERRLA, EOM intact.   Pulmonary: normal breath sounds, No wheezing. Right post chest scar.  CardiovascularNormal S1,S2.  No m/r/g.   Abdomen: Benign, Soft, non-tender.lower abdominal scar.  Renal:  No costovertebral tenderness  GU:  No performed at this time. Endoc: No evident thyromegaly, no signs of acromegaly. Skin:   warm, no rashes, no ecchymosis  Extremities: normal, no cyanosis, clubbing.  Other findings:    LABORATORY PANEL:   CBC Recent Labs  Lab 07/26/17 1040  WBC 5.4  HGB 16.5  HCT 47.1  PLT 256    ------------------------------------------------------------------------------------------------------------------  Chemistries  Recent Labs  Lab 07/26/17 1040  NA 138  K 4.0  CL 101  CO2 26  GLUCOSE 110*  BUN 12  CREATININE 0.76  CALCIUM 9.7   ------------------------------------------------------------------------------------------------------------------  Cardiac Enzymes Recent Labs  Lab 07/26/17 1040  TROPONINI <0.03   ------------------------------------------------------------  RADIOLOGY:  No results found.     Thank  you for the consultation and for allowing [facility] to assist in the care of your patient. Our recommendations are noted above.  Please contact us if we can be of further service.   [name], MD.  Board Certified in Internal Medicine, Pulmonary Medicine, [place], and Sleep Medicine.  [facility] Office Number: [phone]  Patricia Pesa, M.D.  Merton Border, M.D  08/01/2017

## 2017-08-01 NOTE — Patient Instructions (Addendum)
Stop bactrim, start taking augmentin for 28 days.  Will check a genetic test for emphysema.   You have severe emphysema and scarring in your lungs.   --Quitting smoking is the most important thing that you can do for your health.  --Quitting smoking will have greater affect on your health than any medicine that we can give you.   --The best way to quit is to set a quit date, usually a day that has meaning like someone's birthday.  --Start any medication prescribed for quitting one week before you quit date. Then toss out the cigarettes on your quit date.  --If you start smoking again, start from scratch--set another quit day and try again!

## 2017-08-02 LAB — ANGIOTENSIN CONVERTING ENZYME: Angiotensin-Converting Enzyme: 87 U/L — ABNORMAL HIGH (ref 14–82)

## 2017-08-04 ENCOUNTER — Ambulatory Visit: Payer: Self-pay | Admitting: Cardiothoracic Surgery

## 2017-08-06 LAB — QUANTIFERON-TB GOLD PLUS (RQFGPL)
QuantiFERON Mitogen Value: >10 IU/mL
QuantiFERON Nil Value: 0.07 IU/mL
QuantiFERON TB1 Ag Value: 0.1 IU/mL
QuantiFERON TB2 Ag Value: 0.11 IU/mL

## 2017-08-06 LAB — QUANTIFERON-TB GOLD PLUS: QuantiFERON-TB Gold Plus: NEGATIVE

## 2017-08-08 ENCOUNTER — Encounter: Payer: Self-pay | Admitting: Internal Medicine

## 2017-08-08 ENCOUNTER — Encounter: Payer: Self-pay | Admitting: Cardiothoracic Surgery

## 2017-08-08 ENCOUNTER — Ambulatory Visit (INDEPENDENT_AMBULATORY_CARE_PROVIDER_SITE_OTHER): Payer: Medicare Other | Admitting: Cardiothoracic Surgery

## 2017-08-08 VITALS — BP 103/71 | HR 90 | Temp 98.6°F | Ht 65.0 in | Wt 139.0 lb

## 2017-08-08 DIAGNOSIS — R911 Solitary pulmonary nodule: Secondary | ICD-10-CM

## 2017-08-08 NOTE — Patient Instructions (Signed)
Please see your appointment listed below. 

## 2017-08-08 NOTE — Progress Notes (Signed)
Patient ID: Samuel Aho., male   DOB: Apr 13, 1950, 67 y.o.   MRN: [id number]  Chief Complaint  Patient presents with  . Follow-up    lung nodule    Referred By Dr. Ashby Dawes Reason for Referral left upper lobe mass  HPI Location, Quality, Duration, Severity, Timing, Context, Modifying Factors, Associated Signs and Symptoms.  Samuel Kielty. is a 67 y.o. male.  He was in his usual state of health until few weeks ago when he presented with to the emergency room with increasing shortness of breath and chest pain.  He underwent a chest x-ray and a CT scan.  The CT scan revealed a diffuse process in the left upper lobe that was thought to be most consistent with pneumonia.  In addition he has severe emphysema with large cystic changes throughout the lungs.  The largest of which is on the left side but I do believe that it is a large bulla and not a pneumothorax.  He was seen by Dr. Ashby Dawes and placed patient on oral antibiotics for 28-day course and will see the patient back again 2 weeks following.  Patient states he has not had any further episodes of shortness of breath and feels quite well overall.  Overall he is very functional.   Past Medical History:  Diagnosis Date  . Hypertension   . Small bowel obstruction [facility])     Past Surgical History:  Procedure Laterality Date  . COLON SURGERY    . LAPAROTOMY N/A 09/27/2014   Procedure: EXPLORATORY LAPAROTOMY;  Surgeon: Dia Crawford III, MD;  Location: [facility] ORS;  Service: General;  Laterality: N/A;    Family History  Problem Relation Age of Onset  . Cancer Mother   . Cancer Father     Social History Social History   Tobacco Use  . Smoking status: Current Every Day Smoker    Packs/day: 1.00    Years: 50.00    Pack years: 50.00  . Smokeless tobacco: Never Used  Substance Use Topics  . Alcohol use: Yes  . Drug use: No    No Known Allergies  Current Outpatient Medications  Medication Sig Dispense Refill  .  amoxicillin-clavulanate (AUGMENTIN) 875-125 MG tablet Take 1 tablet by mouth 2 (two) times daily for 28 days. Stop other antibiotics. 56 tablet 0  . hydrochlorothiazide (HYDRODIURIL) 25 MG tablet Take 25 mg by mouth daily.    . Multiple Vitamin (MULTIVITAMIN) capsule Take 1 capsule by mouth daily.     No current facility-administered medications for this visit.       Review of Systems A complete review of systems was asked and was negative except for the following positive findings.  Blood pressure 103/71, pulse 90, temperature 98.6 F (37 C), temperature source Oral, height 5\' 5"  (1.651 m), weight 139 lb (63 kg).  Physical Exam CONSTITUTIONAL:  Pleasant, well-developed, well-nourished, and in no acute distress. EYES: Pupils equal and reactive to light, Sclera non-icteric EARS, NOSE, MOUTH AND THROAT:  The oropharynx was clear.  Dentition is good repair.  Oral mucosa pink and moist. LYMPH NODES:  Lymph nodes in the neck and axillae were normal RESPIRATORY:  Lungs were very distant.  Normal respiratory effort without pathologic use of accessory muscles of respiration CARDIOVASCULAR: Heart was regular without murmurs.  There were no carotid bruits. GI: The abdomen was soft, nontender, and nondistended. There were no palpable masses. There was no hepatosplenomegaly. There were normal bowel sounds in all quadrants. GU:  Rectal deferred.  MUSCULOSKELETAL:  Normal muscle strength and tone.  No clubbing or cyanosis.   SKIN:  There were no pathologic skin lesions.  There were no nodules on palpation. NEUROLOGIC:  Sensation is normal.  Cranial nerves are grossly intact. PSYCH:  Oriented to person, place and time.  Mood and affect are normal.  Data Reviewed CT scan  I have personally reviewed the patient's imaging, laboratory findings and medical records.    Assessment    I have independently reviewed the patient's CT scan.  He does have an ill-defined process in the left upper lobe and is  currently on antibiotics for that.    Plan    I did not see any need for any surgical intervention at this time.  He will continue his follow-up with our pulmonologist Dr. Ashby Dawes.  I told the patient that I be happy to see him in the future if anything is needed.      Nestor Lewandowsky, MD 08/08/2017, 4:09 PM

## 2017-08-24 DIAGNOSIS — R197 Diarrhea, unspecified: Secondary | ICD-10-CM | POA: Diagnosis not present

## 2017-08-24 DIAGNOSIS — R109 Unspecified abdominal pain: Secondary | ICD-10-CM | POA: Diagnosis not present

## 2017-10-03 NOTE — Progress Notes (Addendum)
[name]     Assessment and Plan:  Lung abscess/infected bulla. -Patient has 1 left lung bulla which may be fluid-filled versus thickened pleura, he also has nodular changes seen in the left upper lobe versus scarring. Repeat chest x-ray was essentially unchanged. - Patient is at high risk for any interventional pulmonary procedures including bronchoscopy or needle biopsy due to severe bullous disease of the lung.  We discussed that we would consider a biopsy only if there is significant enlargement or other red flags which would be suggestive of malignancy or worsening infection  Severe bullous emphysema. -alpha-1 ordered but not done.  --Denies dyspnea, and is fairly active, therefore will not start maintenance inhaler.   Nicotine abuse. - Currently cutting down on smoking, discussed for 4 min.   Orders Placed This Encounter  Procedures  . CT Chest Wo Contrast   Return in about 4 months (around 02/03/2018) for after ct chest. .    Date: 10/03/2017  MRN# [id number] Samuel Hood. November 26, 1950    Samuel Hood. is a 67 y.o. old male seen in consultation for chief complaint of:    Chief Complaint  Patient presents with  . Emphysema    pt states breathing is doing good, He does yard work and doesn't have any trouble  . smoker    HPI:   67 y.o. male with a history of hypertension, smoking, and alcohol he presented to the ED on 07/26/2017 with an acute episode of dyspnea and chest pain, coughing.  He underwent a chest x-ray, followed by CT chest which showed abnormal scarring the left upper lobe, subsequently referred for outpatient follow-up with pulmonary.  He remains fairly active in tending to his and his neighbor's yard, he does not have dyspnea. He denies weight loss, his weight is unchanged. No hemoptysis, no cough. He denies chest pain.  He continues to smoke about 6 cigs per day, he is thinking of quitting, he was smoking a ppd, he wants to cut  down on his own. He drinking 4 beers per day.    **Chest x-ray 08/01/2017>> images personally reviewed, in comparison with previous chest x-ray it is essentially unchanged. **ACE level 08/01/2017>> 87. **TB QuantiFERON 08/01/2017>> negative **CT chest 07/26/2017>> images personally reviewed: Severe bullous apical emphysema, worse in the left apex with pleural parenchymal scarring peripherally. One of the apical bullae appears to have a fluid level. **Alpha-1 08/01/17>> MM (normal).   Social Hx:   Social History   Tobacco Use  . Smoking status: Current Every Day Smoker    Packs/day: 1.00    Years: 50.00    Pack years: 50.00  . Smokeless tobacco: Never Used  Substance Use Topics  . Alcohol use: Yes  . Drug use: No   Medication:    Current Outpatient Medications:  .  hydrochlorothiazide (HYDRODIURIL) 25 MG tablet, Take 25 mg by mouth daily., Disp: , Rfl:  .  Multiple Vitamin (MULTIVITAMIN) capsule, Take 1 capsule by mouth daily., Disp: , Rfl:    Allergies:  Patient has no known allergies.   Review of Systems:  Constitutional: Feels well. Cardiovascular: No chest pain.  Pulmonary: Denies dyspnea.   The remainder of systems were reviewed and were found to be negative other than what is documented in the HPI.    Physical Examination:   VS: BP 98/62 (BP Location: Left Arm, Cuff Size: Normal)   Pulse (!) 107   Resp 16   Ht 5\' 5"  (1.651 m)  Wt 139 lb (63 kg)   SpO2 100%   BMI 23.13 kg/m   General Appearance: No distress  Neuro:without focal findings, mental status, speech normal, alert and oriented HEENT: PERRLA, EOM intact Pulmonary: No wheezing, No rales  CardiovascularNormal S1,S2.  No m/r/g.  Abdomen: Benign, Soft, non-tender, No masses Renal:  No costovertebral tenderness  GU:  No performed at this time. Endoc: No evident thyromegaly, no signs of acromegaly or Cushing features Skin:   warm, no rashes, no ecchymosis  Extremities: normal, no cyanosis,  clubbing.    LABORATORY PANEL:   CBC No results for input(s): WBC, HGB, HCT, PLT in the last 168 hours. ------------------------------------------------------------------------------------------------------------------  Chemistries  No results for input(s): NA, K, CL, CO2, GLUCOSE, BUN, CREATININE, CALCIUM, MG, AST, ALT, ALKPHOS, BILITOT in the last 168 hours.  Invalid input(s): GFRCGP ------------------------------------------------------------------------------------------------------------------  Cardiac Enzymes No results for input(s): TROPONINI in the last 168 hours. ------------------------------------------------------------  RADIOLOGY:  No results found.     Thank  you for the consultation and for allowing [facility] to assist in the care of your patient. Our recommendations are noted above.  Please contact us if we can be of further service.  [name], M.D., F.C.C.P.  Board Certified in Internal Medicine, Pulmonary Medicine, [place], and Sleep Medicine.  [facility] Office Number: [phone]  10/03/2017

## 2017-10-04 ENCOUNTER — Ambulatory Visit: Payer: Medicare Other | Admitting: Internal Medicine

## 2017-10-04 ENCOUNTER — Encounter: Payer: Self-pay | Admitting: Internal Medicine

## 2017-10-04 DIAGNOSIS — F17210 Nicotine dependence, cigarettes, uncomplicated: Secondary | ICD-10-CM | POA: Diagnosis not present

## 2017-10-04 DIAGNOSIS — R918 Other nonspecific abnormal finding of lung field: Secondary | ICD-10-CM

## 2017-10-04 NOTE — Patient Instructions (Signed)
--  Quitting smoking is the most important thing that you can do for your health.  --Quitting smoking will have greater affect on your health than any medicine that we can give you.   Will check CT chest in 4 months, and follow up after.

## 2017-12-15 DIAGNOSIS — Z9049 Acquired absence of other specified parts of digestive tract: Secondary | ICD-10-CM | POA: Diagnosis not present

## 2017-12-15 DIAGNOSIS — Z23 Encounter for immunization: Secondary | ICD-10-CM | POA: Diagnosis not present

## 2017-12-15 DIAGNOSIS — Z86010 Personal history of colonic polyps: Secondary | ICD-10-CM | POA: Diagnosis not present

## 2018-01-30 ENCOUNTER — Encounter: Payer: Self-pay | Admitting: *Deleted

## 2018-01-31 ENCOUNTER — Ambulatory Visit: Payer: Medicare Other | Admitting: Anesthesiology

## 2018-01-31 ENCOUNTER — Encounter: Payer: Self-pay | Admitting: *Deleted

## 2018-01-31 ENCOUNTER — Encounter
Admission: RE | Disposition: A | Discharge status: Home / Self Care | Payer: Self-pay | Source: Home / Self Care | Attending: Internal Medicine

## 2018-01-31 ENCOUNTER — Ambulatory Visit
Admission: RE | Admit: 2018-01-31 | Discharge: 2018-01-31 | Disposition: A | Discharge status: Home / Self Care | Payer: Medicare Other | Attending: Internal Medicine | Admitting: Internal Medicine

## 2018-01-31 DIAGNOSIS — D123 Benign neoplasm of transverse colon: Secondary | ICD-10-CM | POA: Insufficient documentation

## 2018-01-31 DIAGNOSIS — Z791 Long term (current) use of non-steroidal anti-inflammatories (NSAID): Secondary | ICD-10-CM | POA: Diagnosis not present

## 2018-01-31 DIAGNOSIS — I10 Essential (primary) hypertension: Secondary | ICD-10-CM | POA: Insufficient documentation

## 2018-01-31 DIAGNOSIS — Z1211 Encounter for screening for malignant neoplasm of colon: Secondary | ICD-10-CM | POA: Diagnosis not present

## 2018-01-31 DIAGNOSIS — Z79899 Other long term (current) drug therapy: Secondary | ICD-10-CM | POA: Insufficient documentation

## 2018-01-31 DIAGNOSIS — Z86010 Personal history of colonic polyps: Secondary | ICD-10-CM | POA: Diagnosis not present

## 2018-01-31 DIAGNOSIS — K648 Other hemorrhoids: Secondary | ICD-10-CM | POA: Diagnosis not present

## 2018-01-31 DIAGNOSIS — K635 Polyp of colon: Secondary | ICD-10-CM | POA: Diagnosis not present

## 2018-01-31 DIAGNOSIS — K640 First degree hemorrhoids: Secondary | ICD-10-CM | POA: Insufficient documentation

## 2018-01-31 DIAGNOSIS — F17200 Nicotine dependence, unspecified, uncomplicated: Secondary | ICD-10-CM | POA: Insufficient documentation

## 2018-01-31 HISTORY — PX: COLONOSCOPY WITH PROPOFOL: SHX5780

## 2018-01-31 HISTORY — DX: Personal history of other medical treatment: Z92.89

## 2018-01-31 SURGERY — COLONOSCOPY WITH PROPOFOL
Anesthesia: General

## 2018-01-31 MED ORDER — SODIUM CHLORIDE 0.9 % IV SOLN
INTRAVENOUS | Status: DC
  Administered 2018-01-31: 13:00:00 via INTRAVENOUS

## 2018-01-31 MED ORDER — FENTANYL CITRATE (PF) 100 MCG/2ML IJ SOLN
INTRAMUSCULAR | Status: DC | PRN
  Administered 2018-01-31 (×2): 50 ug via INTRAVENOUS

## 2018-01-31 MED ORDER — PROPOFOL 10 MG/ML IV BOLUS
INTRAVENOUS | Status: DC | PRN
  Administered 2018-01-31: 100 mg via INTRAVENOUS
  Administered 2018-01-31: 50 mg via INTRAVENOUS

## 2018-01-31 MED ORDER — FENTANYL CITRATE (PF) 100 MCG/2ML IJ SOLN
INTRAMUSCULAR | Status: AC
  Filled 2018-01-31: qty 2

## 2018-01-31 MED ORDER — LIDOCAINE 2% (20 MG/ML) 5 ML SYRINGE
INTRAMUSCULAR | Status: DC | PRN
  Administered 2018-01-31: 30 mg via INTRAVENOUS

## 2018-01-31 MED ORDER — PHENYLEPHRINE HCL 10 MG/ML IJ SOLN
INTRAMUSCULAR | Status: DC | PRN
  Administered 2018-01-31: 100 ug via INTRAVENOUS

## 2018-01-31 MED ORDER — PROPOFOL 500 MG/50ML IV EMUL
INTRAVENOUS | Status: DC | PRN
  Administered 2018-01-31: 180 ug/kg/min via INTRAVENOUS

## 2018-01-31 NOTE — H&P (Signed)
Outpatient short stay form Pre-procedure 01/31/2018 12:16 PM Samuel Hood K. Samuel Hood, M.D.  Primary Physician: Chilton Greathouse, M.D.  Reason for visit:  Personal hx of colon polyps s/p sigmoidectomy.  History of present illness:                           Patient presents for colonoscopy for a personal hx of colon polyps. The patient denies abdominal pain, abnormal weight loss or rectal bleeding.    No current facility-administered medications for this encounter.   Medications Prior to Admission  Medication Sig Dispense Refill Last Dose  . naproxen (NAPROSYN) 500 MG tablet Take 500 mg by mouth 2 (two) times daily with a meal.     . hydrochlorothiazide (HYDRODIURIL) 25 MG tablet Take 25 mg by mouth daily.   Taking  . ibuprofen (ADVIL,MOTRIN) 200 MG tablet Take 200 mg by mouth daily as needed.   Taking  . losartan (COZAAR) 50 MG tablet Take 50 mg by mouth daily.   Taking  . Multiple Vitamin (MULTIVITAMIN) capsule Take 1 capsule by mouth daily.   Taking     No Known Allergies   Past Medical History:  Diagnosis Date  . History of angiography    left lower extremity  . Hypertension   . Small bowel obstruction ([place])     Review of systems:  Otherwise negative.    Physical Exam  Gen: Alert, oriented. Appears stated age.  HEENT: [place]/AT. PERRLA. Lungs: CTA, no wheezes. CV: RR nl S1, S2. Abd: soft, benign, no masses. BS+ Ext: No edema. Pulses 2+    Planned procedures: Proceed with colonoscopy. The patient understands the nature of the planned procedure, indications, risks, alternatives and potential complications including but not limited to bleeding, infection, perforation, damage to internal organs and possible oversedation/side effects from anesthesia. The patient agrees and gives consent to proceed.  Please refer to procedure notes for findings, recommendations and patient disposition/instructions.     Samuel Hood K. Samuel Hood, M.D. Gastroenterology 01/31/2018  12:16 PM

## 2018-01-31 NOTE — Transfer of Care (Signed)
Immediate Anesthesia Transfer of Care Note  Patient: Samuel Hood.  Procedure(s) Performed: COLONOSCOPY WITH PROPOFOL (N/A )  Patient Location: PACU and Endoscopy Unit  Anesthesia Type:General  Level of Consciousness: drowsy  Airway & Oxygen Therapy: Patient Spontanous Breathing and Patient connected to nasal cannula oxygen  Post-op Assessment: Report given to RN and Post -op Vital signs reviewed and stable  Post vital signs: Reviewed and stable  Last Vitals:  Vitals Value Taken Time  BP    Temp    Pulse 77 01/31/2018  2:01 PM  Resp 11 01/31/2018  2:01 PM  SpO2 98 % 01/31/2018  2:01 PM  Vitals shown include unvalidated device data.  Last Pain:  Vitals:   01/31/18 1248  TempSrc: Tympanic         Complications: No apparent anesthesia complications

## 2018-01-31 NOTE — Anesthesia Post-op Follow-up Note (Signed)
Anesthesia QCDR form completed.        

## 2018-01-31 NOTE — Anesthesia Preprocedure Evaluation (Addendum)
Anesthesia Evaluation  Patient identified by MRN, date of birth, ID band Patient awake    Reviewed: Allergy & Precautions, NPO status , Patient's Chart, lab work & pertinent test results  History of Anesthesia Complications Negative for: history of anesthetic complications  Airway Mallampati: II  TM Distance: >3 FB Neck ROM: Full    Dental  (+) Upper Dentures, Lower Dentures   Pulmonary neg sleep apnea, neg COPD, Current Smoker,    breath sounds clear to auscultation- rhonchi (-) wheezing      Cardiovascular hypertension, Pt. on medications (-) CAD, (-) Past MI, (-) Cardiac Stents and (-) CABG  Rhythm:Regular Rate:Normal - Systolic murmurs and - Diastolic murmurs    Neuro/Psych negative neurological ROS  negative psych ROS   GI/Hepatic negative GI ROS, Neg liver ROS,   Endo/Other  negative endocrine ROSneg diabetes  Renal/GU negative Renal ROS     Musculoskeletal negative musculoskeletal ROS (+)   Abdominal (+) - obese,   Peds  Hematology negative hematology ROS (+)   Anesthesia Other Findings Past Medical History: No date: History of angiography     Comment:  left lower extremity No date: Hypertension No date: Small bowel obstruction (HCC)   Reproductive/Obstetrics                             Anesthesia Physical Anesthesia Plan  ASA: II  Anesthesia Plan: General   Post-op Pain Management:    Induction: Intravenous  PONV Risk Score and Plan: 0 and Propofol infusion  Airway Management Planned: Natural Airway  Additional Equipment:   Intra-op Plan:   Post-operative Plan:   Informed Consent: I have reviewed the patients History and Physical, chart, labs and discussed the procedure including the risks, benefits and alternatives for the proposed anesthesia with the patient or authorized representative who has indicated his/her understanding and acceptance.   Dental advisory  given  Plan Discussed with: CRNA and Anesthesiologist  Anesthesia Plan Comments:        Anesthesia Quick Evaluation

## 2018-01-31 NOTE — Interval H&P Note (Signed)
History and Physical Interval Note:  01/31/2018 12:17 PM  Samuel Hood.  has presented today for surgery, with the diagnosis of PERSONAL HX.OF COLON POLYPS  The various methods of treatment have been discussed with the patient and family. After consideration of risks, benefits and other options for treatment, the patient has consented to  Procedure(s): COLONOSCOPY WITH PROPOFOL (N/A) as a surgical intervention .  The patient's history has been reviewed, patient examined, no change in status, stable for surgery.  I have reviewed the patient's chart and labs.  Questions were answered to the patient's satisfaction.     [place], [place]

## 2018-01-31 NOTE — Op Note (Signed)
[facility] Gastroenterology Patient Name: Anel Purohit Procedure Date: 01/31/2018 1:23 PM MRN: [id number] Account #: [id number] Date of Birth: 12/16/1950 Admit Type: Outpatient Age: 67 Room: [facility] ENDO ROOM 2 Gender: Male Note Status: Finalized Procedure:            Colonoscopy Indications:          High risk colon cancer surveillance: Personal history                        of colonic polyps Providers:            Benay Pike. [name] MD, MD Referring MD:         Scot Jun. [facility] (Referring MD) Medicines:            Propofol per Anesthesia Complications:        No immediate complications. Procedure:            Pre-Anesthesia Assessment:                       - The risks and benefits of the procedure and the                        sedation options and risks were discussed with the                        patient. All questions were answered and informed                        consent was obtained.                       - Patient identification and proposed procedure were                        verified prior to the procedure by the nurse. The                        procedure was verified in the procedure room.                       - ASA Grade Assessment: II - A patient with mild                        systemic disease.                       - After reviewing the risks and benefits, the patient                        was deemed in satisfactory condition to undergo the                        procedure.                       After obtaining informed consent, the colonoscope was                        passed under direct vision. Throughout the procedure,  the patient's blood pressure, pulse, and oxygen                        saturations were monitored continuously. The                        Colonoscope was introduced through the anus and                        advanced to the the cecum, identified by appendiceal                        orifice and  ileocecal valve. The colonoscopy was                        performed without difficulty. The patient tolerated the                        procedure well. The quality of the bowel preparation                        was good. The ileocecal valve, appendiceal orifice, and                        rectum were photographed. Findings:      The perianal and digital rectal examinations were normal. Pertinent       negatives include normal sphincter tone and no palpable rectal lesions.      A 8 mm polyp was found in the transverse colon. The polyp was       semi-pedunculated. The polyp was removed with a cold snare. Resection       and retrieval were complete.      A 4 mm polyp was found in the descending colon. The polyp was sessile.       The polyp was removed with a jumbo cold forceps. Resection and retrieval       were complete.      Non-bleeding internal hemorrhoids were found during retroflexion. The       hemorrhoids were Grade I (internal hemorrhoids that do not prolapse).      The exam was otherwise without abnormality. Impression:           - One 8 mm polyp in the transverse colon, removed with                        a cold snare. Resected and retrieved.                       - One 4 mm polyp in the descending colon, removed with                        a jumbo cold forceps. Resected and retrieved.                       - Non-bleeding internal hemorrhoids.                       - The examination was otherwise normal. Recommendation:       - Patient has a contact number available for  emergencies. The signs and symptoms of potential                        delayed complications were discussed with the patient.                        Return to normal activities tomorrow. Written discharge                        instructions were provided to the patient.                       - Resume previous diet.                       - Continue present medications.                        - Repeat colonoscopy is recommended for surveillance.                        The colonoscopy date will be determined after pathology                        results from today's exam become available for review.                       - Return to GI office PRN.                       - The findings and recommendations were discussed with                        the patient and their family. Procedure Code(s):    --- Professional ---                       [phone], Colonoscopy, flexible; with removal of tumor(s),                        polyp(s), or other lesion(s) by snare technique                       [id number], 55, Colonoscopy, flexible; with biopsy, single                        or multiple Diagnosis Code(s):    --- Professional ---                       K64.0, First degree hemorrhoids                       D12.4, Benign neoplasm of descending colon                       D12.3, Benign neoplasm of transverse colon (hepatic                        flexure or splenic flexure)                       Z86.010, Personal history of colonic polyps CPT copyright 2018 American Medical Association. All  rights reserved. The codes documented in this report are preliminary and upon coder review may  be revised to meet current compliance requirements. Efrain Sella MD, MD 01/31/2018 1:58:56 PM This report has been signed electronically. Number of Addenda: 0 Note Initiated On: 01/31/2018 1:23 PM Scope Withdrawal Time: 0 hours 10 minutes 35 seconds  Total Procedure Duration: 0 hours 18 minutes 38 seconds       [facility]

## 2018-01-31 NOTE — Anesthesia Postprocedure Evaluation (Signed)
Anesthesia Post Note  Patient: Samuel Hood.  Procedure(s) Performed: COLONOSCOPY WITH PROPOFOL (N/A )  Patient location during evaluation: Endoscopy Anesthesia Type: General Level of consciousness: awake and alert and oriented Pain management: pain level controlled Vital Signs Assessment: post-procedure vital signs reviewed and stable Respiratory status: spontaneous breathing, nonlabored ventilation and respiratory function stable Cardiovascular status: blood pressure returned to baseline and stable Postop Assessment: no signs of nausea or vomiting Anesthetic complications: no     Last Vitals:  Vitals:   01/31/18 1402 01/31/18 1410  BP: 96/76 120/70  Pulse: 78   Resp: 12 18  Temp: (!) 36.1 C   SpO2: 98%     Last Pain:  Vitals:   01/31/18 1420  TempSrc:   PainSc: 0-No pain                 Sukaina Toothaker

## 2018-02-01 ENCOUNTER — Encounter: Payer: Self-pay | Admitting: Internal Medicine

## 2018-02-03 LAB — SURGICAL PATHOLOGY

## 2018-03-01 ENCOUNTER — Ambulatory Visit: Payer: Medicare HMO

## 2018-03-02 ENCOUNTER — Ambulatory Visit: Payer: Medicare Other | Admitting: Internal Medicine

## 2018-03-10 ENCOUNTER — Ambulatory Visit: Payer: Medicare HMO

## 2018-03-13 ENCOUNTER — Ambulatory Visit: Payer: Medicare HMO | Admitting: Internal Medicine

## 2018-06-23 ENCOUNTER — Other Ambulatory Visit: Payer: Self-pay

## 2018-06-23 ENCOUNTER — Other Ambulatory Visit: Payer: Self-pay | Admitting: Internal Medicine

## 2018-06-23 ENCOUNTER — Encounter: Payer: Self-pay | Admitting: Physician Assistant

## 2018-06-23 ENCOUNTER — Ambulatory Visit (INDEPENDENT_AMBULATORY_CARE_PROVIDER_SITE_OTHER): Payer: Medicare Other | Admitting: Physician Assistant

## 2018-06-23 VITALS — BP 131/88 | HR 91 | Temp 98.6°F | Resp 16 | Ht 65.0 in | Wt 146.0 lb

## 2018-06-23 DIAGNOSIS — Z7289 Other problems related to lifestyle: Secondary | ICD-10-CM

## 2018-06-23 DIAGNOSIS — B1920 Unspecified viral hepatitis C without hepatic coma: Secondary | ICD-10-CM | POA: Diagnosis not present

## 2018-06-23 DIAGNOSIS — Z789 Other specified health status: Secondary | ICD-10-CM

## 2018-06-23 DIAGNOSIS — R160 Hepatomegaly, not elsewhere classified: Secondary | ICD-10-CM | POA: Insufficient documentation

## 2018-06-23 DIAGNOSIS — R918 Other nonspecific abnormal finding of lung field: Secondary | ICD-10-CM

## 2018-06-23 DIAGNOSIS — R350 Frequency of micturition: Secondary | ICD-10-CM | POA: Diagnosis not present

## 2018-06-23 DIAGNOSIS — J439 Emphysema, unspecified: Secondary | ICD-10-CM

## 2018-06-23 DIAGNOSIS — R911 Solitary pulmonary nodule: Secondary | ICD-10-CM | POA: Insufficient documentation

## 2018-06-23 DIAGNOSIS — I10 Essential (primary) hypertension: Secondary | ICD-10-CM

## 2018-06-23 MED ORDER — AMLODIPINE BESYLATE 5 MG PO TABS: 5.0000 mg | ORAL_TABLET | Freq: Every day | ORAL | 0 refills | Status: DC

## 2018-06-23 NOTE — Progress Notes (Addendum)
Patient: Samuel Hood., Male    DOB: 04/27/1950, 68 y.o.   MRN: [id number] Visit Date: 06/23/2018  Today's Provider: Trinna Post, PA-C   Chief Complaint  Patient presents with  . New Patient (Initial Visit)   Subjective:    New Patient to [facility]. is a 68 y.o. male who presents today for health maintenance and establish patient care, patients previous PCP was Othelia Pulling, PA-C in [place], [place]. He feels well. He reports exercising daily by doing yard work, patient states in general he has a healthy diet. He reports he is sleeping well.  Lives in [place], [place] with girlfriend. Three grown children. Five or six grandchildren. One daughter he reports is using drugs. Used to work at [employer] but is currently retired.  History of bowel obstruction. He has a history of sigmoid resection on 09/03/2013 for large polyp. He had a large bowel obstruction in 06/18/2014 and then small bowel obstruction in 09/2017 that required surgical lysis of adhesions. Underwent colonoscopy in 01/31/2018 with [facility] clinic that showed 8 mm and 57mm tubular adenoma, recommended repeat colonoscopy in 5 years.  HTN: Currently taking 25 mg HCTZ every other day. It is prescribed daily but he reports he is taking it every other day because it makes him urinate.  Tobacco Abuse: Smokes 1/2 pack per day currently. Started smoking at age 13-15. Smoked one pack per day when he was younger, most recently started smoking 1/2 pack per day.   Emphysema/Lung Nodule: He underwent CT Chest w/ contrast on 07/26/2017 after he presented to the ED with SOB. It showed advanced changes of bullous emphysema, scarring in the left upper lobe and soft tissue nodularity up to 1.6 cm with PET CT recommended. It also showed a small chronic appearing hydropneumothorax over the left apex.   He followed up with Dr. Juanell Fairly in pulmonology 07/2017 for this and left lung bulla was thought to be fluid filled vs thickened  pleura, possibly representing aspiration episode due to excess alcohol intake. 4 week course of antibiotics were given with intent to follow up with CXR in 4 weeks. Patient followed up in 09/2017. Repeat CXR was unchanged. Patient high risk for intervention due to bullous disease of lungs and a CT chest was ordered. However, patient reports he did not get this imaging study because of insurance issues. Today he reports he smokes one half pack per day and is trying to quit. He denies SOB at rest or with activity. He is not currently using any inhalers.   Liver Nodule: 2019 Chest CT had incidental finding of slowly enlarging lesion within the central portion of right liver lobe, MRI recommended. Denies history of drug use, known history of hepatitis infections.  Alcohol Use: at 4:00 PM drinks a shot of gin and then drinks 1-2 beers at night. Patient reports he doesn't think he consumes excess alcohol.   [facility]: [address] for vaccinations   Wt Readings from Last 3 Encounters:  06/23/18 146 lb (66.2 kg)  01/31/18 144 lb (65.3 kg)  10/04/17 139 lb (63 kg)   Lung doctor  re -----------------------------------------------------------------   Review of Systems  All other systems reviewed and are negative.   Social History He  reports that he has been smoking. He has a 50.00 pack-year smoking history. He has never used smokeless tobacco. He reports current alcohol use. He reports that he does not use drugs. Social History  Socioeconomic History  . Marital status: Single    Spouse name: Not on file  . Number of children: Not on file  . Years of education: Not on file  . Highest education level: Not on file  Occupational History  . Not on file  Social Needs  . Financial resource strain: Not on file  . Food insecurity:    Worry: Not on file    Inability: Not on file  . Transportation needs:    Medical: Not on file    Non-medical: Not on file  Tobacco Use  . Smoking status:  Current Every Day Smoker    Packs/day: 1.00    Years: 50.00    Pack years: 50.00  . Smokeless tobacco: Never Used  Substance and Sexual Activity  . Alcohol use: Yes  . Drug use: No  . Sexual activity: Not on file  Lifestyle  . Physical activity:    Days per week: Not on file    Minutes per session: Not on file  . Stress: Not on file  Relationships  . Social connections:    Talks on phone: Not on file    Gets together: Not on file    Attends religious service: Not on file    Active member of club or organization: Not on file    Attends meetings of clubs or organizations: Not on file    Relationship status: Not on file  Other Topics Concern  . Not on file  Social History Narrative  . Not on file    Patient Active Problem List   Diagnosis Date Noted  . Hypertension 03/26/2016  . Small bowel obstruction ([place])   . Intestinal obstruction ([place]) 09/26/2014  . Essential hypertension 06/19/2014  . Hypokalemia 06/19/2014  . Bowel obstruction ([place]) 06/18/2014    Past Surgical History:  Procedure Laterality Date  . COLON SURGERY    . COLONOSCOPY W/ POLYPECTOMY    . COLONOSCOPY WITH PROPOFOL N/A 01/31/2018   Procedure: COLONOSCOPY WITH PROPOFOL;  Surgeon: Toledo, Benay Pike, MD;  Location: [facility] ENDOSCOPY;  Service: Gastroenterology;  Laterality: N/A;  . debridement fasciotomy leg, left Left 03/19/2016  . LAPAROTOMY N/A 09/27/2014   Procedure: EXPLORATORY LAPAROTOMY;  Surgeon: Dia Crawford III, MD;  Location: [facility] ORS;  Service: General;  Laterality: N/A;    Family History  Family Status  Relation Name Status  . Mother  (Not Specified)  . Father  (Not Specified)   His family history includes Cancer in his father and mother.     No Known Allergies  Previous Medications   HYDROCHLOROTHIAZIDE (HYDRODIURIL) 25 MG TABLET    Take 25 mg by mouth daily.   IBUPROFEN (ADVIL,MOTRIN) 200 MG TABLET    Take 200 mg by mouth daily as needed.   MULTIPLE VITAMIN (MULTIVITAMIN) CAPSULE    Take  1 capsule by mouth daily.   NAPROXEN (NAPROSYN) 500 MG TABLET    Take 500 mg by mouth 2 (two) times daily with a meal.    Patient Care Team: Paulene Floor as PCP - General (Physician Assistant)      Objective:   Vitals: BP 131/88   Pulse 91   Temp 98.6 F (37 C) (Oral)   Resp 16   Ht 5\' 5"  (1.651 m)   Wt 146 lb (66.2 kg)   BMI 24.30 kg/m    Physical Exam Constitutional:      Appearance: Normal appearance.  Cardiovascular:     Rate and Rhythm: Normal rate and regular rhythm.  Heart sounds: Normal heart sounds.  Pulmonary:     Effort: Pulmonary effort is normal.     Breath sounds: Wheezing present.  Abdominal:     General: Abdomen is flat.     Palpations: Abdomen is soft. There is no fluid wave, hepatomegaly or mass.     Tenderness: There is no abdominal tenderness.  Skin:    General: Skin is warm and dry.  Neurological:     Mental Status: He is alert and oriented to person, place, and time. Mental status is at baseline.  Psychiatric:        Mood and Affect: Mood normal.        Behavior: Behavior normal.      Depression Screen PHQ 2/9 Scores 06/23/2018  PHQ - 2 Score 0  PHQ- 9 Score 0      Assessment & Plan:     Routine Health Maintenance and Physical Exam  Exercise Activities and Dietary recommendations Goals   None     Immunization History  Administered Date(s) Administered  . Influenza, High Dose Seasonal PF 12/14/2017  . Tdap 03/19/2016    Health Maintenance  Topic Date Due  . Hepatitis C Screening  May 01, 1950  . PNA vac Low Risk Adult (1 of 2 - PCV13) 02/21/2017  . INFLUENZA VACCINE  09/16/2018  . TETANUS/TDAP  03/19/2026  . COLONOSCOPY  02/01/2028     Discussed health benefits of physical activity, and encouraged him to engage in regular exercise appropriate for his age and condition.    1. Essential hypertension  Will change HCTZ 25 mg to amlodipine 5 mg as urinary frequency is intolerable to him and he is taking the  HCTZ every other day. Labs as below. Will discuss statin at next visit, he had 3 vessel coronary atherosclerosis on last CT scan.   - amLODipine (NORVASC) 5 MG tablet; Take 1 tablet (5 mg total) by mouth daily.  Dispense: 90 tablet; Refill: 0 - CBC with Differential - Lipid Profile - TSH  2. Lung nodule  He is a lifelong smoker and has severe bullous emphysema as demonstrated on CT Chest scan from 2019. At the time of this scan, he had findings concerning for infection vs. Malignancy. He was seen as an outpatient by pulmonology and completed 4 wk antibiotic course with no apparent change in imaging. Repeat CT chest scan was ordered but patient did not get this due to insurance issues. Additionally, he has a liver mass that was never followed up. He continues to smoke. He denies alcohol abuse though I think this is questionable. Referring to oncology for guidance on imaging and further workup of these issues as patient does currently have health insurance.  Reports he got pneumonia shot at [facility] on [address], we will request these records.  3. Liver mass  Noted incidentally on CT chest Scan in 2019 with recommendation to follow up with MRI. Patient never did undergo follow up imaging. Refer to oncology, see #2/   - Comprehensive Metabolic Panel (CMET) - Hepatitis c antibody (reflex)  4. Urinary frequency  We have changed HCTZ to amlodipine. Will check PSA.  - PSA  5. Chronic bullous emphysema ([place])  Counseled on smoking cessation. He does not report and SOB or difficulty with physical activity. Will abstain from inhalers right now.  6. Alcohol use  Questionably alcohol abuse. He seemed to have an event one year ago that could represent aspiration. He starts drinking at 4:00 PM with a shot of gin and  may drink a couple of beers after that.   The entirety of the information documented in the History of Present Illness, Review of Systems and Physical Exam were personally  obtained by me. Portions of this information were initially documented by Jennings Books, CMA and reviewed by me for thoroughness and accuracy.   F/u 1 month for HTN and HLD -------------------------------------------------------------------- ,I,Kathleen J Wolford,acting as a scribe for [employer], PA-C.,have documented all relevant documentation on the behalf of Trinna Post, PA-C,as directed by  Trinna Post, PA-C while in the presence of Trinna Post, PA-C.

## 2018-06-23 NOTE — Patient Instructions (Signed)
STOP HYDROCHLOROTHIAZIDE  Starting today please take Amlodipine daily.

## 2018-06-24 LAB — COMPREHENSIVE METABOLIC PANEL
ALT: 41 IU/L (ref 0–44)
AST: 63 IU/L — ABNORMAL HIGH (ref 0–40)
Albumin/Globulin Ratio: 1.2 (ref 1.2–2.2)
Albumin: 4 g/dL (ref 3.8–4.8)
Alkaline Phosphatase: 71 IU/L (ref 39–117)
BUN/Creatinine Ratio: 7 — ABNORMAL LOW (ref 10–24)
BUN: 6 mg/dL — ABNORMAL LOW (ref 8–27)
Bilirubin Total: 0.5 mg/dL (ref 0.0–1.2)
CO2: 23 mmol/L (ref 20–29)
Calcium: 9.6 mg/dL (ref 8.6–10.2)
Chloride: 99 mmol/L (ref 96–106)
Creatinine, Ser: 0.81 mg/dL (ref 0.76–1.27)
GFR calc Af Amer: 107 mL/min/{1.73_m2} (ref >59)
GFR calc non Af Amer: 93 mL/min/{1.73_m2} (ref >59)
Globulin, Total: 3.4 g/dL (ref 1.5–4.5)
Glucose: 99 mg/dL (ref 65–99)
Potassium: 3.9 mmol/L (ref 3.5–5.2)
Sodium: 136 mmol/L (ref 134–144)
Total Protein: 7.4 g/dL (ref 6.0–8.5)

## 2018-06-24 LAB — CBC WITH DIFFERENTIAL/PLATELET
Basophils Absolute: 0 10*3/uL (ref 0.0–0.2)
Basos: 1 %
EOS (ABSOLUTE): 0.1 10*3/uL (ref 0.0–0.4)
Eos: 3 %
Hematocrit: 43.2 % (ref 37.5–51.0)
Hemoglobin: 14.6 g/dL (ref 13.0–17.7)
Immature Grans (Abs): 0 10*3/uL (ref 0.0–0.1)
Immature Granulocytes: 0 %
Lymphocytes Absolute: 1.8 10*3/uL (ref 0.7–3.1)
Lymphs: 33 %
MCH: 32.4 pg (ref 26.6–33.0)
MCHC: 33.8 g/dL (ref 31.5–35.7)
MCV: 96 fL (ref 79–97)
Monocytes Absolute: 0.7 10*3/uL (ref 0.1–0.9)
Monocytes: 13 %
Neutrophils Absolute: 2.8 10*3/uL (ref 1.4–7.0)
Neutrophils: 50 %
Platelets: 297 10*3/uL (ref 150–450)
RBC: 4.5 x10E6/uL (ref 4.14–5.80)
RDW: 13 % (ref 11.6–15.4)
WBC: 5.6 10*3/uL (ref 3.4–10.8)

## 2018-06-24 LAB — LIPID PANEL
Chol/HDL Ratio: 2.5 ratio (ref 0.0–5.0)
Cholesterol, Total: 108 mg/dL (ref 100–199)
HDL: 43 mg/dL (ref >39)
LDL Calculated: 54 mg/dL (ref 0–99)
Triglycerides: 56 mg/dL (ref 0–149)
VLDL Cholesterol Cal: 11 mg/dL (ref 5–40)

## 2018-06-24 LAB — COMMENT2 - HEP PANEL

## 2018-06-24 LAB — TSH: TSH: 0.99 u[IU]/mL (ref 0.450–4.500)

## 2018-06-24 LAB — PSA: Prostate Specific Ag, Serum: 5 ng/mL — ABNORMAL HIGH (ref 0.0–4.0)

## 2018-06-24 LAB — HEPATITIS C ANTIBODY (REFLEX): HCV Ab: >11 s/co ratio — ABNORMAL HIGH (ref 0.0–0.9)

## 2018-06-26 ENCOUNTER — Telehealth: Payer: Self-pay | Admitting: Oncology

## 2018-06-26 ENCOUNTER — Encounter: Payer: Self-pay | Admitting: *Deleted

## 2018-06-26 DIAGNOSIS — R911 Solitary pulmonary nodule: Secondary | ICD-10-CM

## 2018-06-26 NOTE — Addendum Note (Signed)
Addended by: Telford Nab on: 06/26/2018 02:14 PM   Modules accepted: Orders

## 2018-06-26 NOTE — Progress Notes (Signed)
  Oncology Nurse Navigator Documentation  Navigator Location: [facility] (06/26/18 1400) Referral date to RadOnc/MedOnc: 06/26/18 (06/26/18 1400) )Navigator Encounter Type: Introductory phone call (06/26/18 1400)   Abnormal Finding Date: 07/26/17 (06/26/18 1400)                   Treatment Phase: Abnormal Scans (06/26/18 1400) Barriers/Navigation Needs: Coordination of Care (06/26/18 1400)   Interventions: Coordination of Care (06/26/18 1400)   Coordination of Care: Appts;Radiology (06/26/18 1400)        Acuity: Level 2 (06/26/18 1400)   Acuity Level 2: Initial guidance, education and coordination as needed;Educational needs;Assistance expediting appointments (06/26/18 1400)  Referral received for patient with lung nodule and possible liver lesion. Pt is scheduled for follow up CT chest on 5/15 and then has follow up scheduled with Dr. Juanell Fairly to review results on 5/18. Per Dr. Tasia Catchings, pt can keep appts for CT and follow up with pulmonary but will need PET scan if lung nodule/liver lesion persist on imaging. Pt tentatively scheduled for PET scan on 5/21 with new consultation with Dr. Tasia Catchings on Friday 5/22. Attempted to call pt to inform of appts but pt did not answer and I was unable to leave a message. Will try again later. Appts have been mailed which includes my contact information.    Time Spent with Patient: 30 (06/26/18 1400)

## 2018-06-26 NOTE — Progress Notes (Signed)
Spoke with patient and reviewed upcoming appts. Pt advised to keep appt as scheduled for CT scan and follow up with Dr. Juanell Fairly. Informed pt that will follow up with him next week after seeing Dr. Juanell Fairly to review next steps. Instructed pt to call back if has any further questions or needs. Pt verbalized understanding.

## 2018-06-26 NOTE — Telephone Encounter (Signed)
Received consultation on evaluation of abnormal CT chest which was done on 07/26/2017 which showed extensive pleural-parenchymal scarring with the left upper lobe with several areas of soft tissue nodularity.  Patient was recommended to proceed with PET scan for further evaluation. Patient is set up to see Dr. Felicie Morn on 06/30/2018.  And there is also another CT chest follow-up scheduled on 06/30/2018.  Ultimately patient will need to have a PET scan to evaluate metabolic activity of this nodule, and a slowly enlarging lesion within the central portion of the right lobe of liver.

## 2018-06-29 ENCOUNTER — Telehealth: Payer: Self-pay

## 2018-06-29 ENCOUNTER — Telehealth: Payer: Self-pay | Admitting: Physician Assistant

## 2018-06-29 NOTE — Telephone Encounter (Signed)
Called patient for COVID-19 pre-screening for in office visit.  Have you recently traveled any where out of the local area in the last 2 weeks? no  Have you been in close contact with a person diagnosed with COVID-19 within the last 2 weeks? no  Do you currently have any of the following symptoms? If so, when did they start? Cough     Diarrhea   Joint Pain Fever      Muscle Pain   Red eyes Shortness of breath   Abdominal pain  Vomiting Loss of smell    Rash    Sore Throat Headache    Weakness   Bruising or bleeding  No symptoms  Okay to proceed with visit. (07/03/18)

## 2018-06-29 NOTE — Telephone Encounter (Signed)
Can we check on this patient's HCV RNA testing? We had to add it on but I do not see it.

## 2018-06-30 ENCOUNTER — Other Ambulatory Visit: Payer: Self-pay

## 2018-06-30 ENCOUNTER — Ambulatory Visit
Admission: RE | Admit: 2018-06-30 | Discharge: 2018-06-30 | Disposition: A | Discharge status: Home / Self Care | Payer: Medicare Other | Source: Ambulatory Visit | Attending: Internal Medicine | Admitting: Internal Medicine

## 2018-06-30 DIAGNOSIS — R918 Other nonspecific abnormal finding of lung field: Secondary | ICD-10-CM | POA: Diagnosis not present

## 2018-06-30 DIAGNOSIS — M7989 Other specified soft tissue disorders: Secondary | ICD-10-CM | POA: Diagnosis not present

## 2018-06-30 NOTE — Telephone Encounter (Signed)
Samuel Hood from [facility] states that this test is still pending. KW

## 2018-07-03 ENCOUNTER — Telehealth: Payer: Self-pay

## 2018-07-03 ENCOUNTER — Ambulatory Visit: Payer: Medicare Other | Admitting: Internal Medicine

## 2018-07-03 ENCOUNTER — Telehealth: Payer: Self-pay | Admitting: Physician Assistant

## 2018-07-03 LAB — SPECIMEN STATUS REPORT

## 2018-07-03 LAB — HCV RT-PCR, QUANT (GRAPH)
HCV log10: 6.656 log10 IU/mL
Hepatitis C Quantitation: 4530000 IU/mL

## 2018-07-03 NOTE — Telephone Encounter (Signed)
Just FYI, no action necessary.   I have called patient about his imaging and lab results.  I have told him his chest CT looks concerning for cancer and that the image on 07/06/2018 will help further evaluate this. I have reminded him of his appointment on 07/07/2018 with Dr. Tasia Catchings. I have also counseled him that imaging does not necessarily confirm a cancer diagnosis and he may need to undergo some biopsies and further testing.   I have spoken to Dr. Tasia Catchings about his elevated PSA and we will allow him to undergo initial consult with oncology on 07/07/2018 and then refer to urology at that point.   I have also told him that he is positive for chronic active hepatitis C, relevant in the context of his liver lesion. He reports he has never been told this before. I have spoken to Dr. Allen Norris about this patient and the need for hepatitis C treatment as I am concerned he may not follow up if he has too many appointments. Dr. Allen Norris advised hepatitis C treatment will likely be deferred if his life expectancy is not long. We will know more after his 07/07/2018 but I do not think his life expectancy exceeds 5 years.   I have counseled him on how hepatitis C is spread and that he should be using condoms and alert his sexual partners. Will place report for CDC.

## 2018-07-03 NOTE — Telephone Encounter (Signed)
Received call report from radiology regarding CT scan performed 06/30/18. Report in Epic. Dr. Ashby Dawes will be notified of results. Radiology recommended patient be scheduled for PET scan, he has one for 07/06/18 now. They will let us know if this needs to be moved up.

## 2018-07-05 ENCOUNTER — Encounter: Payer: Self-pay | Admitting: *Deleted

## 2018-07-05 ENCOUNTER — Ambulatory Visit: Payer: Medicare Other | Admitting: Internal Medicine

## 2018-07-05 NOTE — Progress Notes (Signed)
  Oncology Nurse Navigator Documentation  Navigator Location: [facility] (07/05/18 1100)   )Navigator Encounter Type: Telephone (07/05/18 1100) Telephone: Samuel Hood Call;Appt Confirmation/Clarification (07/05/18 1100)                       Barriers/Navigation Needs: No Questions;No Needs (07/05/18 1100)   Interventions: None required (07/05/18 1100)         Called pt to touch base with him prior to his PET scan on 5/21 and new patient visit on 5/22 with Dr. Tasia Catchings. Pt stated that he knows where to go for his appts and did not have any questions at this time. Encouraged pt to call if has any further questions or needs. Pt verbalized understanding.              Time Spent with Patient: 30 (07/05/18 1100)

## 2018-07-06 ENCOUNTER — Other Ambulatory Visit: Payer: Self-pay

## 2018-07-06 ENCOUNTER — Encounter
Admission: RE | Admit: 2018-07-06 | Discharge: 2018-07-06 | Disposition: A | Discharge status: Home / Self Care | Payer: Medicare Other | Source: Ambulatory Visit | Attending: Oncology | Admitting: Oncology

## 2018-07-06 ENCOUNTER — Telehealth: Payer: Self-pay | Admitting: Internal Medicine

## 2018-07-06 DIAGNOSIS — K8020 Calculus of gallbladder without cholecystitis without obstruction: Secondary | ICD-10-CM | POA: Insufficient documentation

## 2018-07-06 DIAGNOSIS — R911 Solitary pulmonary nodule: Secondary | ICD-10-CM | POA: Insufficient documentation

## 2018-07-06 DIAGNOSIS — I700 Atherosclerosis of aorta: Secondary | ICD-10-CM | POA: Diagnosis not present

## 2018-07-06 DIAGNOSIS — J439 Emphysema, unspecified: Secondary | ICD-10-CM | POA: Insufficient documentation

## 2018-07-06 DIAGNOSIS — I2510 Atherosclerotic heart disease of native coronary artery without angina pectoris: Secondary | ICD-10-CM | POA: Insufficient documentation

## 2018-07-06 DIAGNOSIS — C3490 Malignant neoplasm of unspecified part of unspecified bronchus or lung: Secondary | ICD-10-CM | POA: Diagnosis not present

## 2018-07-06 LAB — GLUCOSE, CAPILLARY: Glucose-Capillary: 98 mg/dL (ref 70–99)

## 2018-07-06 IMAGING — PT NUCLEAR MEDICINE PET IMAGE INITIAL (PI) SKULL BASE TO THIGH
1 of 10 series · 3 of 16 positions shown, 4 images · non-contrast
Comparison: CT chest [DATE] and CT chest [DATE]

CLINICAL DATA: Initial treatment strategy for lung cancer.

EXAM:
NUCLEAR MEDICINE PET SKULL BASE TO THIGH
TECHNIQUE: 7.84 mCi F-18 FDG was injected intravenously. Full-ring PET imaging
was performed from the skull base to thigh after the radiotracer. CT
data was obtained and used for attenuation correction and anatomic
localization.
Fasting blood glucose: 98 mg/dl

[Series 3: ct wb 5.0 b30f · axial · 0.98mm/px · z∈[-1052,-186]mm · 3 of 289 slices shown, 4 images]
[im 1/289  soft-tissue]
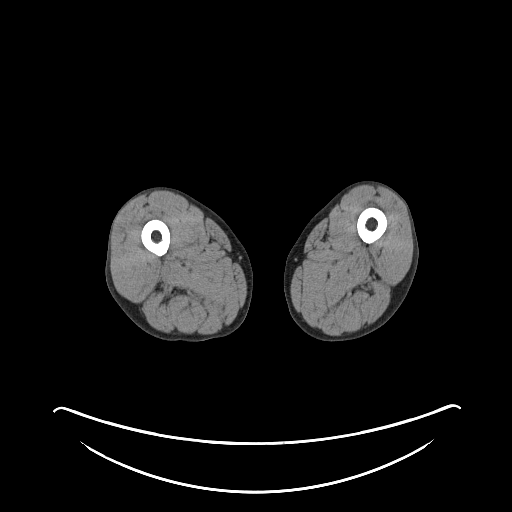
[im 1/289  bone]
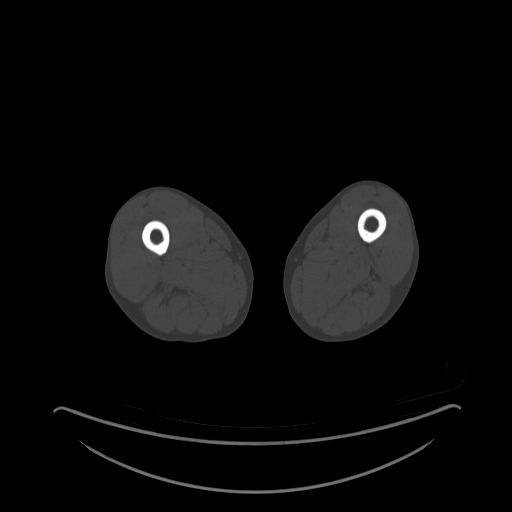
[im 145/289  soft-tissue]
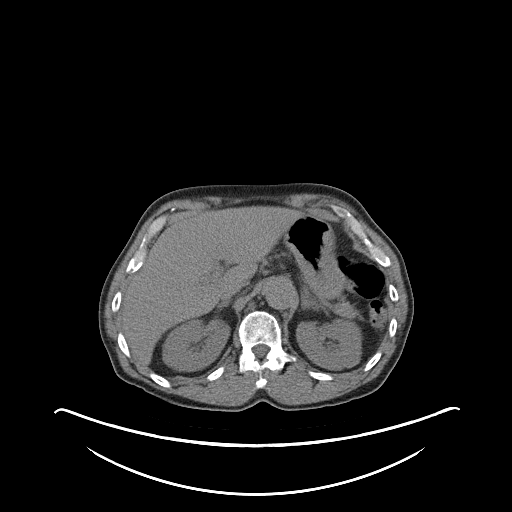
[im 289/289  soft-tissue]
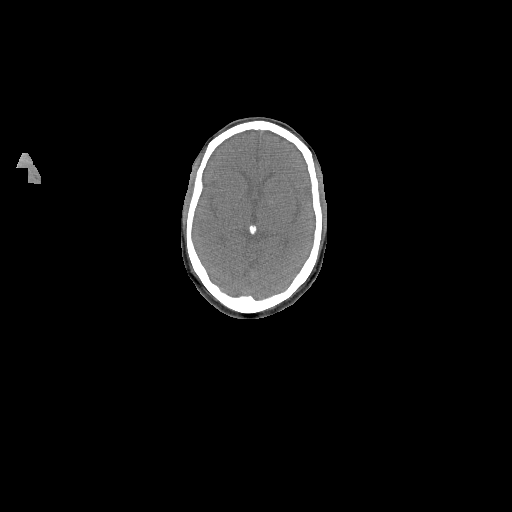

[3 of 16 positions shown; findings below may reference images not displayed]

FINDINGS: Mediastinal blood pool activity: SUV max

Liver activity: SUV max NA

NECK: No hypermetabolic lymph nodes in the neck.

Incidental CT findings: none

CHEST: There is no hypermetabolic axillary or supraclavicular lymph
node. Advanced changes of bullous emphysema identified. Again seen
is extensive pleuroparenchymal scarring within the left upper lobe
and progressive nodularity.

-index nodule within the left upper lobe measures 1.3 cm and has an
SUV max of 11.59.

-subpleural nodule within the anterolateral left upper lobe with
suspected chest wall involvement measures 2.6 x 1.5 cm and has an
SUV max of 11.74.

-within the left posterior mediastinum there is a soft tissue mass
posterior to the aorta which measures 3.0 x 3.9 cm within SUV max of
4.95.

-FDG avid lymph node posterior to the esophagus measures 1.5 cm and
has an SUV max of 7.07.

-small left internal mammary lymph node is mildly FDG avid measuring
0.8 cm within SUV max of 1.5, image 80/3.

Incidental CT findings: Aortic atherosclerosis. Calcification in the
LAD, RCA and left circumflex coronary arteries.

ABDOMEN/PELVIS: No FDG uptake within the liver, pancreas, spleen, or
adrenal glands. No hypermetabolic abdominopelvic lymph nodes
identified.

Incidental CT findings: Aortic atherosclerosis. No aneurysm.
Gallstones. Partially calcified hypodense lesion within the liver
measures 3.1 cm without FDG uptake.

SKELETON: Lytic lesion within the T11 vertebra is favored to
represent sequelae of direct extension from posterior mediastinal
tumor. This measures 8.4 mm with an SUV max of 4.71, image 127/3.
Possible tumor early extension into the left T11-12 neural foramina
is identified, image 132/3.

Incidental CT findings: none
IMPRESSION: 1. There are 2 FDG avid lesions within the left upper lobe worrisome
for primary lung neoplasm. There is evidence of chest wall
involvement. Metastasis to the posterior mediastinum is identified
with extension into the T11 vertebra and possible involvement of the
left T11-12 neural foramina. Assuming non-small cell histology
imaging findings are compatible with [NN] or stage LOPES disease.
2. Aortic Atherosclerosis ([NN]-[NN]) and Emphysema ([NN]-[NN]).
3. Coronary artery calcifications
4. Gallstones.

## 2018-07-06 MED ORDER — FLUDEOXYGLUCOSE F - 18 (FDG) INJECTION
7.6000 | Freq: Once | INTRAVENOUS | Status: AC | PRN
  Administered 2018-07-06: 7.84 via INTRAVENOUS

## 2018-07-06 NOTE — Telephone Encounter (Signed)
Called patient for COVID-19 pre-screening for in office visit.  Have you recently traveled any where out of the local area in the last 2 weeks?NO  Have you been in close contact with a person diagnosed with COVID-19 within the last 2 weeks? NO  Do you currently have any of the following symptoms? If so, when did they start? Cough     Diarrhea   Joint Pain Fever      Muscle Pain   Red eyes Shortness of breath   Abdominal pain  Vomiting Loss of smell    Rash    Sore Throat Headache    Weakness   Bruising or bleeding   Okay to proceed with visit 07/07/2018

## 2018-07-06 NOTE — Progress Notes (Signed)
[facility]     Assessment and Plan:  Lung nodule/scarring in the left upper lobe. Paraspinal mass. -Patient has 1 left lung bulla which may be fluid-filled versus thickened pleura, he also has nodular changes seen in the left upper lobe versus scarring.  - Repeat CT chest shows slight increase in size in the left upper lobe nodule/scarring.  Think is increased PET uptake in the left nodule, as well as posterior mediastinum.  Discussed with radiology may be amenable for CT-guided needle biopsy, will refer.  Severe bullous emphysema. -alpha-1 ordered but not done.  --Denies dyspnea, and is fairly active, therefore will not start maintenance inhaler.   Nicotine abuse. - Currently smoking about half pack a day, discussed the importance of smoke cessation, spent 3 minutes in discussion. Orders Placed This Encounter  Procedures   CT BIOPSY   Return in about 1 week (around 07/14/2018) for 1 week after biopsy..    Date: 07/06/2018  MRN# [id number] Samuel Hood. December 15, 1950    Samuel Hood. is a 68 y.o. old male seen in consultation for chief complaint of:    Chief Complaint  Patient presents with   Follow-up    CT 06/30/2018. c/o sob with exertion.     HPI:   68 y.o. male with a history of hypertension, smoking, and alcohol abuse.  He was last seen by me in August 2019, at that time he was noted to have very bullous emphysema with a possibly infected bleb.  He was given a course of antibiotics, it was thought that he would be high risk for endoscopic biopsy due to lung disease.  A repeat CT chest was ordered for December, however this was not done  and the patient did not follow-up due to insurance issues which he tells me that are now resolved.  Subsequently had imaging after being seen by PCP and oncology earlier this month.  He was sent for CT chest which showed slight advancement of the LUL densities, subsequently sent for PET scan which showed  lung nodule  and paraspinal masses.  He is otherwise feeling well, he notes that he has left shoulder pain. He is smoking about half ppd, not really thinking about quitting at this time.   He continues to deny weight loss, his weight is unchanged. No hemoptysis, no cough. He drinking 4 beers per day and shot of gin.  He continues to be active, mows his lawn with a riding mower.   **CT 07/26/2017>> images personally viewed, in comparison to previous, the left upper lobe pleuroparenchymal scarring appears to be stable versus slightly advanced.  There are enlarging paraspinal masses. **Chest x-ray 08/01/2017>> images personally reviewed, in comparison with previous chest x-ray it is essentially unchanged. **ACE level 08/01/2017>> 87. **TB QuantiFERON 08/01/2017>> negative **CT chest 07/26/2017>> images personally reviewed: Severe bullous apical emphysema, worse in the left apex with pleural parenchymal scarring peripherally. One of the apical bullae appears to have a fluid level. **Alpha-1 08/01/17>> MM (normal).   Social Hx:   Social History   Tobacco Use   Smoking status: Current Every Day Smoker    Packs/day: 1.00    Years: 50.00    Pack years: 50.00   Smokeless tobacco: Never Used  Substance Use Topics   Alcohol use: Yes   Drug use: No   Medication:    Current Outpatient Medications:    amLODipine (NORVASC) 5 MG tablet, Take 1 tablet (5 mg total) by mouth daily., Disp: 90 tablet,  Rfl: 0   ibuprofen (ADVIL,MOTRIN) 200 MG tablet, Take 200 mg by mouth daily as needed., Disp: , Rfl:    Multiple Vitamin (MULTIVITAMIN) capsule, Take 1 capsule by mouth daily., Disp: , Rfl:    naproxen (NAPROSYN) 500 MG tablet, Take 500 mg by mouth 2 (two) times daily with a meal., Disp: , Rfl:    Allergies:  Patient has no known allergies.   Review of Systems:  Constitutional: Feels well. Cardiovascular: No chest pain.  Pulmonary: Denies dyspnea.   The remainder of systems were reviewed and were found to be  negative other than what is documented in the HPI.    Physical Examination:   VS: BP 124/78 (BP Location: Left Arm, Cuff Size: Normal)    Pulse (!) 107    Temp 98.1 F (36.7 C) (Oral)    Ht 5\' 5"  (1.651 m)    Wt 144 lb 6.4 oz (65.5 kg)    SpO2 97%    BMI 24.03 kg/m   General Appearance: No distress  Neuro:without focal findings, mental status, speech normal, alert and oriented HEENT: PERRLA, EOM intact Pulmonary: No wheezing, No rales  CardiovascularNormal S1,S2.  No m/r/g.  Abdomen: Benign, Soft, non-tender, No masses Renal:  No costovertebral tenderness  GU:  No performed at this time. Endoc: No evident thyromegaly, no signs of acromegaly or Cushing features Skin:   warm, no rashes, no ecchymosis  Extremities: normal, no cyanosis, clubbing.    LABORATORY PANEL:   CBC No results for input(s): WBC, HGB, HCT, PLT in the last 168 hours. ------------------------------------------------------------------------------------------------------------------  Chemistries  No results for input(s): NA, K, CL, CO2, GLUCOSE, BUN, CREATININE, CALCIUM, MG, AST, ALT, ALKPHOS, BILITOT in the last 168 hours.  Invalid input(s): GFRCGP ------------------------------------------------------------------------------------------------------------------  Cardiac Enzymes No results for input(s): TROPONINI in the last 168 hours. ------------------------------------------------------------  RADIOLOGY:  No results found.     Thank  you for the consultation and for allowing [facility] to assist in the care of your patient. Our recommendations are noted above.  Please contact us if we can be of further service.  [name], M.D., F.C.C.P.  Board Certified in Internal Medicine, Pulmonary Medicine, [place], and Sleep Medicine.  [facility] Office Number: [phone]  07/06/2018

## 2018-07-07 ENCOUNTER — Ambulatory Visit (INDEPENDENT_AMBULATORY_CARE_PROVIDER_SITE_OTHER): Payer: Medicare Other | Admitting: Internal Medicine

## 2018-07-07 ENCOUNTER — Other Ambulatory Visit: Payer: Self-pay

## 2018-07-07 ENCOUNTER — Encounter: Payer: Self-pay | Admitting: Oncology

## 2018-07-07 ENCOUNTER — Inpatient Hospital Stay: Payer: Medicare Other | Attending: Oncology | Admitting: Oncology

## 2018-07-07 VITALS — BP 124/78 | HR 107 | Temp 98.1°F | Ht 65.0 in | Wt 144.4 lb

## 2018-07-07 VITALS — BP 113/83 | HR 111 | Temp 97.3°F | Resp 18 | Ht 65.0 in | Wt 144.0 lb

## 2018-07-07 DIAGNOSIS — F17210 Nicotine dependence, cigarettes, uncomplicated: Secondary | ICD-10-CM | POA: Insufficient documentation

## 2018-07-07 DIAGNOSIS — I10 Essential (primary) hypertension: Secondary | ICD-10-CM | POA: Diagnosis not present

## 2018-07-07 DIAGNOSIS — J439 Emphysema, unspecified: Secondary | ICD-10-CM

## 2018-07-07 DIAGNOSIS — R5383 Other fatigue: Secondary | ICD-10-CM | POA: Diagnosis not present

## 2018-07-07 DIAGNOSIS — Z791 Long term (current) use of non-steroidal anti-inflammatories (NSAID): Secondary | ICD-10-CM | POA: Diagnosis not present

## 2018-07-07 DIAGNOSIS — K769 Liver disease, unspecified: Secondary | ICD-10-CM | POA: Diagnosis not present

## 2018-07-07 DIAGNOSIS — R918 Other nonspecific abnormal finding of lung field: Secondary | ICD-10-CM

## 2018-07-07 DIAGNOSIS — Z79899 Other long term (current) drug therapy: Secondary | ICD-10-CM | POA: Insufficient documentation

## 2018-07-07 DIAGNOSIS — M25512 Pain in left shoulder: Secondary | ICD-10-CM | POA: Insufficient documentation

## 2018-07-07 DIAGNOSIS — J851 Abscess of lung with pneumonia: Secondary | ICD-10-CM

## 2018-07-07 DIAGNOSIS — R911 Solitary pulmonary nodule: Secondary | ICD-10-CM

## 2018-07-07 DIAGNOSIS — R222 Localized swelling, mass and lump, trunk: Secondary | ICD-10-CM

## 2018-07-07 DIAGNOSIS — Z7189 Other specified counseling: Secondary | ICD-10-CM | POA: Insufficient documentation

## 2018-07-07 NOTE — Patient Instructions (Addendum)
We will be calling you to set up a biopsy of either your lung nodule or back.

## 2018-07-07 NOTE — Progress Notes (Signed)
Hematology/Oncology Consult note [facility] [phone] [phone]   Patient Care Team: Paulene Floor as PCP - General (Physician Assistant) Telford Nab, RN as Registered Nurse  REFERRING PROVIDER: Trinna Post, PA-C  CHIEF COMPLAINTS/REASON FOR VISIT:  Evaluation of lung nodule in the liver lesion  HISTORY OF PRESENTING ILLNESS:   Samuel Hood. is a  68 y.o.  male with PMH listed below was seen in consultation at the request of  Pollak, Adriana M, PA-C  for evaluation of lung nodule in the liver lesion. Patient has a past medical history of hypertension, alcohol abuse, smoking emphysema.  He has had serial CTs done in the past.  CT images were independently reviewed by me. 07/26/2017 CT chest with contrast showed extensive pleural-parenchymal scarring within the left upper lobe with several areas of soft tissue nodularity measuring up to 1.6 cm.  In this patient who is at increased risk of lung cancer further investigation with PET scan is recommended. Small chronic appearance loculated hydropneumothorax overlies the left apex. Slowly enlarging lesion within the central portion of the right lobe of liver identified. Per note, patient supposed to have additional work-up done in December repeat CT scan which he did not  Patient was recently seen by primary care provider and has subsequent image done for follow-up. CT on 06/30/2018 showed multiple significantly enlarged paraspinal mass are noted concerning.  The largest measures 3.8 x 0.5 cm and there appears to be a lytic destruction of the adjacent T11 vertebral body.  Also noted is new solid density measuring 17 x 12 mm in the pleural parenchymal density in the left upper lobe noted on prior exam.  Concerning for malignancy.  4.4 ascending thoracic aortic aneurysm.  Recommend annual imaging.  Patient had PET scan done on 07/06/2018 Which showed there are 2 FDG avid lesion  within the left upper lobe worrisome for primary lung neoplasm.  There is evidence of chest wall involvement.  Metastasis to the posterior mediastinum is identified with extension into T11 vertebra and possible involvement of the left T12 neural foramina.  Patient reports left shoulder pain.  Smoking half a pack a day not motivated with further questioning quitting Denies weight loss, hemoptysis, cough.  Chronic shortness of breath with exertion. He drinks alcohol daily. Lives with his girlfriend.  He has 5 adult kids   Review of Systems  Constitutional: Positive for fatigue. Negative for appetite change, chills, fever and unexpected weight change.  HENT:   Negative for hearing loss and voice change.   Eyes: Negative for eye problems and icterus.  Respiratory: Positive for shortness of breath. Negative for chest tightness and cough.   Cardiovascular: Negative for chest pain and leg swelling.  Gastrointestinal: Negative for abdominal distention and abdominal pain.  Endocrine: Negative for hot flashes.  Genitourinary: Negative for difficulty urinating, dysuria and frequency.   Musculoskeletal: Negative for arthralgias.  Skin: Negative for itching and rash.  Neurological: Negative for light-headedness and numbness.  Hematological: Negative for adenopathy. Does not bruise/bleed easily.  Psychiatric/Behavioral: Negative for confusion.    MEDICAL HISTORY:  Past Medical History:  Diagnosis Date   History of angiography    left lower extremity   Hypertension    Small bowel obstruction ([place])     SURGICAL HISTORY: Past Surgical History:  Procedure Laterality Date   COLON SURGERY     COLONOSCOPY W/ POLYPECTOMY     COLONOSCOPY WITH PROPOFOL N/A 01/31/2018   Procedure: COLONOSCOPY WITH PROPOFOL;  Surgeon: Toledo, Benay Pike, MD;  Location: [facility] ENDOSCOPY;  Service: Gastroenterology;  Laterality: N/A;   debridement fasciotomy leg, left Left 03/19/2016   LAPAROTOMY N/A 09/27/2014    Procedure: EXPLORATORY LAPAROTOMY;  Surgeon: Dia Crawford III, MD;  Location: [facility] ORS;  Service: General;  Laterality: N/A;    SOCIAL HISTORY: Social History   Socioeconomic History   Marital status: Single    Spouse name: Not on file   Number of children: Not on file   Years of education: Not on file   Highest education level: Not on file  Occupational History   Not on file  Social Needs   Financial resource strain: Not on file   Food insecurity:    Worry: Not on file    Inability: Not on file   Transportation needs:    Medical: Not on file    Non-medical: Not on file  Tobacco Use   Smoking status: Current Every Day Smoker    Packs/day: 1.00    Years: 50.00    Pack years: 50.00   Smokeless tobacco: Never Used  Substance and Sexual Activity   Alcohol use: Yes   Drug use: No   Sexual activity: Not on file  Lifestyle   Physical activity:    Days per week: Not on file    Minutes per session: Not on file   Stress: Not on file  Relationships   Social connections:    Talks on phone: Not on file    Gets together: Not on file    Attends religious service: Not on file    Active member of club or organization: Not on file    Attends meetings of clubs or organizations: Not on file    Relationship status: Not on file   Intimate partner violence:    Fear of current or ex partner: Not on file    Emotionally abused: Not on file    Physically abused: Not on file    Forced sexual activity: Not on file  Other Topics Concern   Not on file  Social History Narrative   Not on file    FAMILY HISTORY: Family History  Problem Relation Age of Onset   Cancer Mother    Cancer Father     ALLERGIES:  has No Known Allergies.  MEDICATIONS:  Current Outpatient Medications  Medication Sig Dispense Refill   amLODipine (NORVASC) 5 MG tablet Take 1 tablet (5 mg total) by mouth daily. 90 tablet 0   ibuprofen (ADVIL,MOTRIN) 200 MG tablet Take 200 mg by mouth daily  as needed.     Multiple Vitamin (MULTIVITAMIN) capsule Take 1 capsule by mouth daily.     naproxen (NAPROSYN) 500 MG tablet Take 500 mg by mouth 2 (two) times daily with a meal.     No current facility-administered medications for this visit.      PHYSICAL EXAMINATION: ECOG PERFORMANCE STATUS: 1 - Symptomatic but completely ambulatory Vitals:   07/07/18 0942  BP: 113/83  Pulse: (!) 111  Resp: 18  Temp: (!) 97.3 F (36.3 C)   Filed Weights   07/07/18 0942  Weight: 144 lb (65.3 kg)    Physical Exam Constitutional:      General: He is not in acute distress. HENT:     Head: Normocephalic and atraumatic.  Eyes:     General: No scleral icterus.    Pupils: Pupils are equal, round, and reactive to light.  Neck:     Musculoskeletal: Normal range of motion and neck  supple.  Cardiovascular:     Rate and Rhythm: Normal rate and regular rhythm.     Heart sounds: Normal heart sounds.  Pulmonary:     Effort: Pulmonary effort is normal. No respiratory distress.     Breath sounds: No wheezing.     Comments: Decreased breath sound bilaterally. Abdominal:     General: Bowel sounds are normal. There is no distension.     Palpations: Abdomen is soft. There is no mass.     Tenderness: There is no abdominal tenderness.  Musculoskeletal: Normal range of motion.        General: No deformity.  Skin:    General: Skin is warm and dry.     Findings: No erythema or rash.  Neurological:     Mental Status: He is alert and oriented to person, place, and time.     Cranial Nerves: No cranial nerve deficit.     Coordination: Coordination normal.  Psychiatric:        Behavior: Behavior normal.        Thought Content: Thought content normal.     LABORATORY DATA:  I have reviewed the data as listed Lab Results  Component Value Date   WBC 5.6 06/23/2018   HGB 14.6 06/23/2018   HCT 43.2 06/23/2018   MCV 96 06/23/2018   PLT 297 06/23/2018   Recent Labs    07/26/17 1040 06/23/18 1100    NA 138 136  K 4.0 3.9  CL 101 99  CO2 26 23  GLUCOSE 110* 99  BUN 12 6*  CREATININE 0.76 0.81  CALCIUM 9.7 9.6  GFRNONAA >60 93  GFRAA >60 107  PROT  --  7.4  ALBUMIN  --  4.0  AST  --  63*  ALT  --  41  ALKPHOS  --  71  BILITOT  --  0.5   Iron/TIBC/Ferritin/ %Sat No results found for: IRON, TIBC, FERRITIN, IRONPCTSAT    RADIOGRAPHIC STUDIES: I have personally reviewed the radiological images as listed and agreed with the findings in the report.  Ct Chest Wo Contrast  Result Date: 06/30/2018 CLINICAL DATA:  Abnormal findings on diagnostic imaging of lung. EXAM: CT CHEST WITHOUT CONTRAST TECHNIQUE: Multidetector CT imaging of the chest was performed following the standard protocol without IV contrast. COMPARISON:  CT scan of July 26, 2017. FINDINGS: Cardiovascular: 4.4 cm ascending thoracic aortic aneurysm is noted. Atherosclerosis of thoracic aorta is noted. Coronary artery calcifications are noted. Normal cardiac size. No pericardial effusion is noted. Mediastinum/Nodes: The esophagus is unremarkable. Thyroid gland appears normal. Multiple enlarged masses are noted in the perispinal region which are significantly increased in size compared to prior exam. The largest measures 3.8 x 3.5 cm in left paraspinal region best seen on image number 116 of series 2. There appears to be lytic destruction of the adjacent vertebral body. Lungs/Pleura: No pneumothorax or pleural effusion is noted. Emphysematous disease is again noted in both lungs. Pleuroparenchymal densities are again noted anteriorly in the left upper lobe. There is new solid component measuring 17 x 12 mm, best seen on image number 52 of series 3. This is concerning for possible neoplasm or malignancy. Upper Abdomen: Stable complex low density is noted in right hepatic lobe compared to prior exam. No other abnormality seen in visualized portion of upper abdomen. Musculoskeletal: As noted above, there is new rounded lucency seen in  anterior portion of T11 vertebral body concerning for metastatic disease. IMPRESSION: Multiple significantly enlarged paraspinal masses are noted  concerning for malignancy or metastatic disease. The largest measures 3.8 x 3.5 cm, and there appears to be lytic destruction of the adjacent T11 vertebral body. Also noted is new solid density measuring 17 x 12 mm in the pleuroparenchymal densities in the left upper lobe noted on prior exam, concerning for malignancy. Further evaluation with PET scan is recommended. These results will be called to the ordering clinician or representative by the Radiologist Assistant, and communication documented in the PACS or zVision Dashboard. 4.4 cm ascending thoracic aortic aneurysm. Recommend annual imaging followup by CTA or MRA. This recommendation follows 2010 ACCF/AHA/AATS/ACR/ASA/SCA/SCAI/SIR/STS/SVM Guidelines for the Diagnosis and Management of Patients with Thoracic Aortic Disease. Circulation. 2010; 121: G626-R485. Aortic aneurysm NOS (ICD10-I71.9). Aortic Atherosclerosis (ICD10-I70.0) and Emphysema (ICD10-J43.9). Electronically Signed   By: Marijo Conception M.D.   On: 06/30/2018 15:53   Nm Pet Image Initial (pi) Skull Base To Thigh  Result Date: 07/07/2018 CLINICAL DATA:  Initial treatment strategy for lung cancer. EXAM: NUCLEAR MEDICINE PET SKULL BASE TO THIGH TECHNIQUE: 7.84 mCi F-18 FDG was injected intravenously. Full-ring PET imaging was performed from the skull base to thigh after the radiotracer. CT data was obtained and used for attenuation correction and anatomic localization. Fasting blood glucose: 98 mg/dl COMPARISON:  CT chest 06/30/2018 and CT chest 07/26/2017 FINDINGS: Mediastinal blood pool activity: SUV max 2.29 Liver activity: SUV max NA NECK: No hypermetabolic lymph nodes in the neck. Incidental CT findings: none CHEST: There is no hypermetabolic axillary or supraclavicular lymph node. Advanced changes of bullous emphysema identified. Again seen is  extensive pleuroparenchymal scarring within the left upper lobe and progressive nodularity. -index nodule within the left upper lobe measures 1.3 cm and has an SUV max of 11.59. -subpleural nodule within the anterolateral left upper lobe with suspected chest wall involvement measures 2.6 x 1.5 cm and has an SUV max of 11.74. -within the left posterior mediastinum there is a soft tissue mass posterior to the aorta which measures 3.0 x 3.9 cm within SUV max of 4.95. -FDG avid lymph node posterior to the esophagus measures 1.5 cm and has an SUV max of 7.07. -small left internal mammary lymph node is mildly FDG avid measuring 0.8 cm within SUV max of 1.5, image 80/3. Incidental CT findings: Aortic atherosclerosis. Calcification in the LAD, RCA and left circumflex coronary arteries. ABDOMEN/PELVIS: No FDG uptake within the liver, pancreas, spleen, or adrenal glands. No hypermetabolic abdominopelvic lymph nodes identified. Incidental CT findings: Aortic atherosclerosis. No aneurysm. Gallstones. Partially calcified hypodense lesion within the liver measures 3.1 cm without FDG uptake. SKELETON: Lytic lesion within the T11 vertebra is favored to represent sequelae of direct extension from posterior mediastinal tumor. This measures 8.4 mm with an SUV max of 4.71, image 127/3. Possible tumor early extension into the left T11-12 neural foramina is identified, image 132/3. Incidental CT findings: none IMPRESSION: 1. There are 2 FDG avid lesions within the left upper lobe worrisome for primary lung neoplasm. There is evidence of chest wall involvement. Metastasis to the posterior mediastinum is identified with extension into the T11 vertebra and possible involvement of the left T11-12 neural foramina. Assuming non-small cell histology imaging findings are compatible with [id number] or stage IVa disease. 2. Aortic Atherosclerosis (ICD10-I70.0) and Emphysema (ICD10-J43.9). 3. Coronary artery calcifications 4. Gallstones.  Electronically Signed   By: Kerby Moors M.D.   On: 07/07/2018 08:51      ASSESSMENT & PLAN:  1. Lung mass   2. Goals of care, counseling/discussion   3.  Paraspinal mass   4. Liver lesion    Patient's PET scan and PET images were independently reviewed by me and discussed with patient. Concerning for primary lung neoplasm with mediastinal lymphadenopathy and direct invasion or metastasis to thoracic spine. Recommend obtaining MRI brain with and without contrast for staging. Recommend CT guided biopsy to establish tissue diagnosis. Recent labs are reviewed and discussed with patient.  Normal kidney function and liver function. Tobacco abuse, # Smoking cessation was discussed with patient.  Patient has been seen by Dr. Felicie Morn pulmonology who also agrees with CT guided biopsy.  Paraspinal mass, will need to involve Radonc for radiation in the future.  Liver lesion is not FDG avid on PET and stable since 1 year ago. Likely benign  Orders Placed This Encounter  Procedures   MR Brain W Wo Contrast    Standing Status:   Future    Standing Expiration Date:   07/07/2019    Order Specific Question:   ** REASON FOR EXAM (FREE TEXT)    Answer:   lung mass    Order Specific Question:   If indicated for the ordered procedure, I authorize the administration of contrast media per Radiology protocol    Answer:   Yes    Order Specific Question:   What is the patient's sedation requirement?    Answer:   No Sedation    Order Specific Question:   Does the patient have a pacemaker or implanted devices?    Answer:   No    Order Specific Question:   Use SRS Protocol?    Answer:   No    Order Specific Question:   Radiology Contrast Protocol - do NOT remove file path    Answer:   [email or web address]    Order Specific Question:   Preferred imaging location?    Answer:   [facility] (table limit-500 lbs)    All questions were answered. The patient knows to call  the clinic with any problems questions or concerns.  cc Trinna Post, PA-C    Return of visit: Few days after CT-guided biopsy to go over pathology results and finalize management plan. Thank you for this kind referral and the opportunity to participate in the care of this patient. A copy of today's note is routed to referring provider  Total face to face encounter time for this patient visit was 60 min. >50% of the time was  spent in counseling and coordination of care.    Earlie Server, MD, PhD Hematology Oncology [facility] Pager- [phone] 07/07/2018

## 2018-07-11 ENCOUNTER — Other Ambulatory Visit: Payer: Self-pay

## 2018-07-12 ENCOUNTER — Other Ambulatory Visit: Payer: Self-pay | Admitting: Internal Medicine

## 2018-07-12 NOTE — Addendum Note (Signed)
Addended by: Laverle Hobby on: 07/12/2018 09:43 AM   Modules accepted: Miquel Dunn

## 2018-07-13 ENCOUNTER — Other Ambulatory Visit: Payer: Self-pay

## 2018-07-13 ENCOUNTER — Other Ambulatory Visit
Admission: RE | Admit: 2018-07-13 | Discharge: 2018-07-13 | Disposition: A | Discharge status: Home / Self Care | Payer: Medicare Other | Source: Ambulatory Visit | Attending: Internal Medicine | Admitting: Internal Medicine

## 2018-07-13 DIAGNOSIS — Z1159 Encounter for screening for other viral diseases: Secondary | ICD-10-CM | POA: Insufficient documentation

## 2018-07-14 ENCOUNTER — Ambulatory Visit: Payer: Medicare Other | Admitting: Internal Medicine

## 2018-07-14 LAB — NOVEL CORONAVIRUS, NAA (HOSP ORDER, SEND-OUT TO REF LAB; TAT 18-24 HRS): SARS-CoV-2, NAA: NOT DETECTED

## 2018-07-17 ENCOUNTER — Ambulatory Visit
Admission: RE | Admit: 2018-07-17 | Discharge: 2018-07-17 | Disposition: A | Discharge status: Home / Self Care | Payer: Medicare Other | Source: Ambulatory Visit | Attending: Internal Medicine | Admitting: Internal Medicine

## 2018-07-17 ENCOUNTER — Other Ambulatory Visit: Payer: Self-pay

## 2018-07-17 DIAGNOSIS — R222 Localized swelling, mass and lump, trunk: Secondary | ICD-10-CM | POA: Diagnosis not present

## 2018-07-17 DIAGNOSIS — R918 Other nonspecific abnormal finding of lung field: Secondary | ICD-10-CM | POA: Insufficient documentation

## 2018-07-17 DIAGNOSIS — R590 Localized enlarged lymph nodes: Secondary | ICD-10-CM | POA: Diagnosis not present

## 2018-07-17 DIAGNOSIS — C382 Malignant neoplasm of posterior mediastinum: Secondary | ICD-10-CM | POA: Diagnosis not present

## 2018-07-17 DIAGNOSIS — R911 Solitary pulmonary nodule: Secondary | ICD-10-CM | POA: Diagnosis not present

## 2018-07-17 DIAGNOSIS — J439 Emphysema, unspecified: Secondary | ICD-10-CM | POA: Diagnosis not present

## 2018-07-17 LAB — CBC WITH DIFFERENTIAL/PLATELET
Abs Immature Granulocytes: 0.04 10*3/uL (ref 0.00–0.07)
Basophils Absolute: 0 10*3/uL (ref 0.0–0.1)
Basophils Relative: 1 %
Eosinophils Absolute: 0.2 10*3/uL (ref 0.0–0.5)
Eosinophils Relative: 3 %
HCT: 44.4 % (ref 39.0–52.0)
Hemoglobin: 15.3 g/dL (ref 13.0–17.0)
Immature Granulocytes: 1 %
Lymphocytes Relative: 33 %
Lymphs Abs: 2.1 10*3/uL (ref 0.7–4.0)
MCH: 32.1 pg (ref 26.0–34.0)
MCHC: 34.5 g/dL (ref 30.0–36.0)
MCV: 93.3 fL (ref 80.0–100.0)
Monocytes Absolute: 0.5 10*3/uL (ref 0.1–1.0)
Monocytes Relative: 8 %
Neutro Abs: 3.4 10*3/uL (ref 1.7–7.7)
Neutrophils Relative %: 54 %
Platelets: 319 10*3/uL (ref 150–400)
RBC: 4.76 MIL/uL (ref 4.22–5.81)
RDW: 14.2 % (ref 11.5–15.5)
WBC: 6.3 10*3/uL (ref 4.0–10.5)
nRBC: 0 % (ref 0.0–0.2)

## 2018-07-17 LAB — APTT: aPTT: 36 seconds (ref 24–36)

## 2018-07-17 LAB — PROTIME-INR
INR: 1 (ref 0.8–1.2)
Prothrombin Time: 12.7 seconds (ref 11.4–15.2)

## 2018-07-17 MED ORDER — FENTANYL CITRATE (PF) 100 MCG/2ML IJ SOLN
INTRAMUSCULAR | Status: AC | PRN
  Administered 2018-07-17: 50 ug via INTRAVENOUS
  Administered 2018-07-17: 25 ug via INTRAVENOUS

## 2018-07-17 MED ORDER — SODIUM CHLORIDE 0.9 % IV SOLN
INTRAVENOUS | Status: DC
  Administered 2018-07-17: 08:00:00 via INTRAVENOUS

## 2018-07-17 MED ORDER — MIDAZOLAM HCL 5 MG/5ML IJ SOLN
INTRAMUSCULAR | Status: AC | PRN
  Administered 2018-07-17 (×2): 1 mg via INTRAVENOUS
  Administered 2018-07-17: 0.5 mg via INTRAVENOUS

## 2018-07-17 MED ORDER — MIDAZOLAM HCL 5 MG/5ML IJ SOLN
INTRAMUSCULAR | Status: AC
  Filled 2018-07-17: qty 5

## 2018-07-17 MED ORDER — FENTANYL CITRATE (PF) 100 MCG/2ML IJ SOLN
INTRAMUSCULAR | Status: AC
  Filled 2018-07-17: qty 4

## 2018-07-17 NOTE — Procedures (Signed)
Interventional Radiology Procedure Note  Procedure: CT Guided Biopsy of Left Posterior Mediastinal Lymph Node  Complications: None  Estimated Blood Loss: < 10 mL  Findings: 63 G core biopsy of posterior mediastinal lymph node performed under CT guidance.  Three core samples obtained and sent to Pathology.  Venetia Night. Kathlene Cote, M.D Pager:  [phone]

## 2018-07-17 NOTE — Discharge Instructions (Signed)
Needle Biopsy, Care After This sheet gives you information about how to care for yourself after your procedure. Your health care provider may also give you more specific instructions. If you have problems or questions, contact your health care provider. What can I expect after the procedure? After the procedure, it is common to have soreness, bruising, or mild pain at the puncture site. This should go away in a few days. Follow these instructions at home: Needle insertion site care   Wash your hands with soap and water before you change your bandage (dressing). If you cannot use soap and water, use hand sanitizer.  Follow instructions from your health care provider about how to take care of your puncture site. This includes: ? When and how to change your dressing. ? When to remove your dressing.  Check your puncture site every day for signs of infection. Check for: ? Redness, swelling, or pain. ? Fluid or blood. ? Pus or a bad smell. ? Warmth. General instructions  Return to your normal activities as told by your health care provider. Ask your health care provider what activities are safe for you.  Do not take baths, swim, or use a hot tub until your health care provider approves. Ask your health care provider if you may take showers. You may only be allowed to take sponge baths.  Take over-the-counter and prescription medicines only as told by your health care provider.  Keep all follow-up visits as told by your health care provider. This is important. Contact a health care provider if:  You have a fever.  You have redness, swelling, or pain at the puncture site that lasts longer than a few days.  You have fluid, blood, or pus coming from your puncture site.  Your puncture site feels warm to the touch. Get help right away if:  You have severe bleeding from the puncture site. Summary  After the procedure, it is common to have soreness, bruising, or mild pain at the puncture  site. This should go away in a few days.  Check your puncture site every day for signs of infection, such as redness, swelling, or pain.  Get help right away if you have severe bleeding from your puncture site. This information is not intended to replace advice given to you by your health care provider. Make sure you discuss any questions you have with your health care provider. Document Released: 06/18/2014 Document Revised: 02/14/2017 Document Reviewed: 02/14/2017 Elsevier Interactive Patient Education  2019 [employer].

## 2018-07-19 ENCOUNTER — Ambulatory Visit
Admission: RE | Admit: 2018-07-19 | Discharge: 2018-07-19 | Disposition: A | Discharge status: Home / Self Care | Payer: Medicare Other | Source: Ambulatory Visit | Attending: Oncology | Admitting: Oncology

## 2018-07-19 ENCOUNTER — Telehealth: Payer: Self-pay | Admitting: Internal Medicine

## 2018-07-19 ENCOUNTER — Other Ambulatory Visit: Payer: Self-pay

## 2018-07-19 DIAGNOSIS — R918 Other nonspecific abnormal finding of lung field: Secondary | ICD-10-CM | POA: Diagnosis not present

## 2018-07-19 DIAGNOSIS — C3490 Malignant neoplasm of unspecified part of unspecified bronchus or lung: Secondary | ICD-10-CM | POA: Diagnosis not present

## 2018-07-19 MED ORDER — GADOBUTROL 1 MMOL/ML IV SOLN
6.0000 mL | Freq: Once | INTRAVENOUS | Status: AC | PRN
  Administered 2018-07-19: 6 mL via INTRAVENOUS

## 2018-07-19 NOTE — Progress Notes (Signed)
[facility]     Assessment and Plan:  Lung nodule/scarring in the left upper lobe. Paraspinal mass. Metastatic hepatocellular carcinoma. -Patient has 1 left lung bulla which may be fluid-filled versus thickened pleura, he also has nodular changes seen in the left upper lobe versus scarring.  --Patient aware of results, oncology following.   Severe bullous emphysema. -alpha-1 ordered but not done.  --Denies dyspnea, and is fairly active, therefore will not start maintenance inhaler.   Nicotine abuse. - Currently smoking about half pack a day, discussed the importance of smoke cessation, spent 3 minutes in discussion.  Return in about 6 months (around 01/19/2019).    Date: 07/19/2018  MRN# [id number] Samuel Hood 21-Sep-1950    Samuel Hood. is a 68 y.o. old male seen in consultation for chief complaint of:    Chief Complaint  Patient presents with  . Follow-up    CT bx 07/17/2018. breathing is doing well.     HPI:   68 y.o. male with a history of hypertension, smoking, and alcohol abuse.  He was last seen by me in August 2019, at that time he was noted to have very bullous emphysema with a possibly infected bleb.  He was given a course of antibiotics, it was thought that he would be high risk for endoscopic biopsy due to lung disease.  A repeat CT chest was ordered for December, however this was not done  and the patient did not follow-up due to insurance issues which he tells me that are now resolved.  Subsequently had imaging after being seen by PCP and oncology earlier this month.  He was sent for CT chest which showed slight advancement of the LUL densities, subsequently sent for PET scan which showed  lung nodule and paraspinal masses.  He is otherwise feeling well, he notes that he has left shoulder pain. He is smoking about half ppd, not really thinking about quitting at this time. He was subsequently sent for a CT-guided needle biopsy of the  paraspinal mass, I discussed the results with Dr. Reuel Derby and pathology, these appear to be consistent with metastatic hepatocellular carcinoma.  He feels that his breathing has been feeling pretty good, he mowed the yard yesterday, he is still smoking 3 cigs per day. Still trying to cut down.     He continues to deny weight loss, his weight is unchanged. No hemoptysis, no cough. He drinking 4 beers per day and shot of gin.  He continues to be active, mows his lawn with a riding mower.   **CT 07/26/2017>> images personally viewed, in comparison to previous, the left upper lobe pleuroparenchymal scarring appears to be stable versus slightly advanced.  There are enlarging paraspinal masses. **Chest x-ray 08/01/2017>> images personally reviewed, in comparison with previous chest x-ray it is essentially unchanged. **ACE level 08/01/2017>> 87. **TB QuantiFERON 08/01/2017>> negative **CT chest 07/26/2017>> images personally reviewed: Severe bullous apical emphysema, worse in the left apex with pleural parenchymal scarring peripherally. One of the apical bullae appears to have a fluid level. **Alpha-1 08/01/17>> MM (normal).   Social Hx:   Social History   Tobacco Use  . Smoking status: Current Every Day Smoker    Packs/day: 0.50    Years: 50.00    Pack years: 25.00  . Smokeless tobacco: Never Used  Substance Use Topics  . Alcohol use: Yes    Alcohol/week: 35.0 standard drinks    Types: 5 Shots of liquor, 30 Cans of beer per  week  . Drug use: No   Medication:    Current Outpatient Medications:  .  amLODipine (NORVASC) 5 MG tablet, Take 1 tablet (5 mg total) by mouth daily., Disp: 90 tablet, Rfl: 0 .  ibuprofen (ADVIL,MOTRIN) 200 MG tablet, Take 200 mg by mouth daily as needed., Disp: , Rfl:  .  Multiple Vitamin (MULTIVITAMIN) capsule, Take 1 capsule by mouth daily., Disp: , Rfl:  .  naproxen (NAPROSYN) 500 MG tablet, Take 500 mg by mouth 2 (two) times daily with a meal., Disp: , Rfl:     Allergies:  Patient has no known allergies.   Review of Systems:  Constitutional: Feels well. Cardiovascular: No chest pain.  Pulmonary: Denies dyspnea.   The remainder of systems were reviewed and were found to be negative other than what is documented in the HPI.    Physical Examination:   VS: There were no vitals taken for this visit.  General Appearance: No distress  Neuro:without focal findings, mental status, speech normal, alert and oriented HEENT: PERRLA, EOM intact Pulmonary: No wheezing, No rales  CardiovascularNormal S1,S2.  No m/r/g.  Abdomen: Benign, Soft, non-tender, No masses Renal:  No costovertebral tenderness  GU:  No performed at this time. Endoc: No evident thyromegaly, no signs of acromegaly or Cushing features Skin:   warm, no rashes, no ecchymosis  Extremities: normal, no cyanosis, clubbing.    LABORATORY PANEL:   CBC Recent Labs  Lab 07/17/18 0725  WBC 6.3  HGB 15.3  HCT 44.4  PLT 319   ------------------------------------------------------------------------------------------------------------------  Chemistries  No results for input(s): NA, K, CL, CO2, GLUCOSE, BUN, CREATININE, CALCIUM, MG, AST, ALT, ALKPHOS, BILITOT in the last 168 hours.  Invalid input(s): GFRCGP ------------------------------------------------------------------------------------------------------------------  Cardiac Enzymes No results for input(s): TROPONINI in the last 168 hours. ------------------------------------------------------------  RADIOLOGY:  No results found.     Thank  you for the consultation and for allowing [facility] to assist in the care of your patient. Our recommendations are noted above.  Please contact us if we can be of further service.  [name], M.D., F.C.C.P.  Board Certified in Internal Medicine, Pulmonary Medicine, [place], and Sleep Medicine.  [facility]  Office Number: [phone]  07/19/2018

## 2018-07-19 NOTE — Telephone Encounter (Signed)
Called patient for COVID-19 pre-screening for in office visit.  Have you recently traveled any where out of the local area in the last 2 weeks? No  Have you been in close contact with a person diagnosed with COVID-19 within the last 2 weeks? No Do you currently have any of the following symptoms? If so, when did they start? Cough     Diarrhea  Joint Pain Fever      Muscle Pain  Red eyes Shortness of breath   Abdominal pain Vomiting Loss of smell    Rash   Sore Throat Headache    Weakness Bruising or bleeding   Okay to proceed with visit 07/20/2018

## 2018-07-20 ENCOUNTER — Ambulatory Visit: Payer: Medicare Other | Admitting: Internal Medicine

## 2018-07-20 ENCOUNTER — Encounter: Payer: Self-pay | Admitting: Internal Medicine

## 2018-07-20 VITALS — BP 116/68 | HR 109 | Temp 98.4°F | Ht 65.0 in | Wt 140.4 lb

## 2018-07-20 DIAGNOSIS — F17210 Nicotine dependence, cigarettes, uncomplicated: Secondary | ICD-10-CM | POA: Diagnosis not present

## 2018-07-20 DIAGNOSIS — J439 Emphysema, unspecified: Secondary | ICD-10-CM

## 2018-07-20 LAB — SURGICAL PATHOLOGY

## 2018-07-20 NOTE — Patient Instructions (Signed)
Continue to follow up with cancer center.

## 2018-07-21 ENCOUNTER — Other Ambulatory Visit: Payer: Self-pay | Admitting: Pathology

## 2018-07-24 ENCOUNTER — Other Ambulatory Visit: Payer: Self-pay

## 2018-07-24 ENCOUNTER — Encounter: Payer: Self-pay | Admitting: Oncology

## 2018-07-24 ENCOUNTER — Inpatient Hospital Stay: Payer: Medicare Other

## 2018-07-24 ENCOUNTER — Inpatient Hospital Stay: Payer: Medicare Other | Attending: Oncology | Admitting: Oncology

## 2018-07-24 ENCOUNTER — Ambulatory Visit
Admission: RE | Admit: 2018-07-24 | Discharge: 2018-07-24 | Disposition: A | Discharge status: Home / Self Care | Payer: Medicare Other | Source: Ambulatory Visit | Attending: Radiation Oncology | Admitting: Radiation Oncology

## 2018-07-24 ENCOUNTER — Encounter: Payer: Self-pay | Admitting: Radiation Oncology

## 2018-07-24 VITALS — BP 124/87 | HR 109 | Temp 97.0°F | Resp 16 | Wt 143.5 lb

## 2018-07-24 DIAGNOSIS — I10 Essential (primary) hypertension: Secondary | ICD-10-CM

## 2018-07-24 DIAGNOSIS — C220 Liver cell carcinoma: Secondary | ICD-10-CM | POA: Diagnosis not present

## 2018-07-24 DIAGNOSIS — B182 Chronic viral hepatitis C: Secondary | ICD-10-CM | POA: Diagnosis not present

## 2018-07-24 DIAGNOSIS — F1010 Alcohol abuse, uncomplicated: Secondary | ICD-10-CM

## 2018-07-24 DIAGNOSIS — Z791 Long term (current) use of non-steroidal anti-inflammatories (NSAID): Secondary | ICD-10-CM | POA: Diagnosis not present

## 2018-07-24 DIAGNOSIS — Z79899 Other long term (current) drug therapy: Secondary | ICD-10-CM | POA: Diagnosis not present

## 2018-07-24 DIAGNOSIS — Z809 Family history of malignant neoplasm, unspecified: Secondary | ICD-10-CM | POA: Insufficient documentation

## 2018-07-24 DIAGNOSIS — F17210 Nicotine dependence, cigarettes, uncomplicated: Secondary | ICD-10-CM | POA: Insufficient documentation

## 2018-07-24 DIAGNOSIS — I712 Thoracic aortic aneurysm, without rupture: Secondary | ICD-10-CM | POA: Diagnosis not present

## 2018-07-24 DIAGNOSIS — C7951 Secondary malignant neoplasm of bone: Secondary | ICD-10-CM | POA: Diagnosis not present

## 2018-07-24 DIAGNOSIS — J948 Other specified pleural conditions: Secondary | ICD-10-CM | POA: Diagnosis not present

## 2018-07-24 DIAGNOSIS — R911 Solitary pulmonary nodule: Secondary | ICD-10-CM | POA: Diagnosis not present

## 2018-07-24 DIAGNOSIS — R5383 Other fatigue: Secondary | ICD-10-CM

## 2018-07-24 DIAGNOSIS — R222 Localized swelling, mass and lump, trunk: Secondary | ICD-10-CM

## 2018-07-24 DIAGNOSIS — M25512 Pain in left shoulder: Secondary | ICD-10-CM

## 2018-07-24 DIAGNOSIS — J439 Emphysema, unspecified: Secondary | ICD-10-CM

## 2018-07-24 DIAGNOSIS — Z7189 Other specified counseling: Secondary | ICD-10-CM

## 2018-07-24 DIAGNOSIS — C7802 Secondary malignant neoplasm of left lung: Secondary | ICD-10-CM

## 2018-07-24 HISTORY — DX: Secondary malignant neoplasm of left lung: C78.02

## 2018-07-24 HISTORY — DX: Secondary malignant neoplasm of left lung: C22.0

## 2018-07-24 MED ORDER — LENVATINIB (12 MG DAILY DOSE) 3 X 4 MG PO CPPK: 12.0000 mg | ORAL_CAPSULE | Freq: Every day | ORAL | 0 refills | Status: DC

## 2018-07-24 NOTE — Consult Note (Signed)
NEW PATIENT EVALUATION  Name: Samuel Hood.  MRN: [id number]  Date:   07/24/2018     DOB: Jan 08, 1951   This 68 y.o. male patient presents to the clinic for initial evaluation of paraspinal metastasis from known hepatocellular carcinoma.  REFERRING PHYSICIAN: Trinna Post, PA-C  CHIEF COMPLAINT:  Chief Complaint  Patient presents with  . Lung Cancer    Initial consultation    DIAGNOSIS: The encounter diagnosis was Paraspinal mass.   PREVIOUS INVESTIGATIONS:  CT scans and PET CT scans reviewed Pathology report reviewed Clinical notes reviewed  HPI: Patient is a 68 year old male with significant past medical history of hypertension alcohol abuse long-term smoking and emphysema.  In June 2019 he had an chest x-ray showing extensive pleural-parenchymal scarring in the left upper lobe with several areas of tissue nodularity.  He was also noted to have a enlarging lesion within the central portion of the right lobe of liver.  Recently seen by primary care CT scan in May 2020 showed multiple large paraspinal mass measuring 3.8 cm with lytic destruction of the adjacent T11 vertebral body.  There is also progression of nodularity in the left upper lobe concerning for malignancy.  There is also a 4.4 cm ascending thoracic aortic aneurysm.  PET CT scan demonstrated metabolically active left upper lobe lesions worrisome for primary lung primary.  There is also metastasis to the posterior mediastinum with extension to the T11 vertebral body and possible involvement of the T12 neuroforamina.  Patient had a fine-needle biopsy of posterior mediastinal mass which was positive for moderately differentiated hepatocellular carcinoma.  Patient is now referred to radiation oncology for consideration of palliative treatment.  He does have some low back pain is having no problems with ambulation.  From a pulmonary standpoint he specifically denies cough hemoptysis or any chest wall pain.  PLANNED TREATMENT  REGIMEN: Palliative radiation therapy to T11  PAST MEDICAL HISTORY:  has a past medical history of History of angiography, Hypertension, and Small bowel obstruction ([place]).    PAST SURGICAL HISTORY:  Past Surgical History:  Procedure Laterality Date  . COLON SURGERY    . COLONOSCOPY W/ POLYPECTOMY    . COLONOSCOPY WITH PROPOFOL N/A 01/31/2018   Procedure: COLONOSCOPY WITH PROPOFOL;  Surgeon: Toledo, Benay Pike, MD;  Location: [facility] ENDOSCOPY;  Service: Gastroenterology;  Laterality: N/A;  . debridement fasciotomy leg, left Left 03/19/2016  . LAPAROTOMY N/A 09/27/2014   Procedure: EXPLORATORY LAPAROTOMY;  Surgeon: Dia Crawford III, MD;  Location: [facility] ORS;  Service: General;  Laterality: N/A;    FAMILY HISTORY: family history includes Cancer in his father and mother.  SOCIAL HISTORY:  reports that he has been smoking. He has a 25.00 pack-year smoking history. He has never used smokeless tobacco. He reports current alcohol use of about 35.0 standard drinks of alcohol per week. He reports that he does not use drugs.  ALLERGIES: Patient has no known allergies.  MEDICATIONS:  Current Outpatient Medications  Medication Sig Dispense Refill  . amLODipine (NORVASC) 5 MG tablet Take 1 tablet (5 mg total) by mouth daily. 90 tablet 0  . ibuprofen (ADVIL,MOTRIN) 200 MG tablet Take 200 mg by mouth daily as needed.    . Multiple Vitamin (MULTIVITAMIN) capsule Take 1 capsule by mouth daily.    . naproxen (NAPROSYN) 500 MG tablet Take 500 mg by mouth 2 (two) times daily with a meal.     No current facility-administered medications for this encounter.     ECOG PERFORMANCE STATUS:  1 - Symptomatic but completely ambulatory  REVIEW OF SYSTEMS: Patient denies any weight loss, fatigue, weakness, fever, chills or night sweats. Patient denies any loss of vision, blurred vision. Patient denies any ringing  of the ears or hearing loss. No irregular heartbeat. Patient denies heart murmur or history of fainting.  Patient denies any chest pain or pain radiating to her upper extremities. Patient denies any shortness of breath, difficulty breathing at night, cough or hemoptysis. Patient denies any swelling in the lower legs. Patient denies any nausea vomiting, vomiting of blood, or coffee ground material in the vomitus. Patient denies any stomach pain. Patient states has had normal bowel movements no significant constipation or diarrhea. Patient denies any dysuria, hematuria or significant nocturia. Patient denies any problems walking, swelling in the joints or loss of balance. Patient denies any skin changes, loss of hair or loss of weight. Patient denies any excessive worrying or anxiety or significant depression. Patient denies any problems with insomnia. Patient denies excessive thirst, polyuria, polydipsia. Patient denies any swollen glands, patient denies easy bruising or easy bleeding. Patient denies any recent infections, allergies or URI. Patient "s visual fields have not changed significantly in recent time.   PHYSICAL EXAM: BP 124/87 (BP Location: Left Arm, Patient Position: Sitting)   Pulse (!) 109   Temp (!) 97 F (36.1 C) (Tympanic)   Resp 16   Wt 143 lb 8.3 oz (65.1 kg)   BMI 23.88 kg/m  No pain is elicited on deep palpation of the spine motor sensory and DTR levels are equal and symmetric in the lower extremities bilaterally.  Proprioception is intact.  Well-developed well-nourished patient in NAD. HEENT reveals PERLA, EOMI, discs not visualized.  Oral cavity is clear. No oral mucosal lesions are identified. Neck is clear without evidence of cervical or supraclavicular adenopathy. Lungs are clear to A&P. Cardiac examination is essentially unremarkable with regular rate and rhythm without murmur rub or thrill. Abdomen is benign with no organomegaly or masses noted. Motor sensory and DTR levels are equal and symmetric in the upper and lower extremities. Cranial nerves II through XII are grossly intact.  Proprioception is intact. No peripheral adenopathy or edema is identified. No motor or sensory levels are noted. Crude visual fields are within normal range.  LABORATORY DATA: Pathology reports reviewed    RADIOLOGY RESULTS: CT scans and PET CT scans reviewed   IMPRESSION: Stage IV hepatocellular carcinoma with both lung and vertebral body involvement  PLAN: December to go ahead with palliative radiation therapy to his lower thoracic spine would incorporate T11 and T12 entry to 3000 cGy in 10 fractions.  Risks and benefits of treatment including possible diarrhea fatigue skin reaction alteration of blood counts all were described in detail to the patient.  Patient comprehends my treatment plan well.  I have personally set up and ordered CT simulation for this week.  Other areas of potential palliative treatment occluding lung will be discussed in the future should problems arise.  I would like to take this opportunity to thank you for allowing me to participate in the care of your patient.Noreene Filbert, MD

## 2018-07-24 NOTE — Progress Notes (Signed)
Met with [name] during his appointment with Dr. Chrystal and Dr. Yu. Re-introduced nurse navigator services and provided my contact information for future needs. We will arrange palliative care services at next visit when he returns for oral chemo teaching. Dr. Yu will present at tumor board. We will further address his concerns regarding cost of all his treatments and refer to PSN for any assist he may need with other financial issues. Escorted to the lab. 

## 2018-07-24 NOTE — Progress Notes (Signed)
Patient here for Biopsy results. No other concerns voiced.

## 2018-07-24 NOTE — Progress Notes (Signed)
START OFF PATHWAY REGIMEN - Other   [id number]:Lenvatinib:   Once daily:     Lenvatinib   **Always confirm dose/schedule in your pharmacy ordering system**  Patient Characteristics: Intent of Therapy: Non-Curative / Palliative Intent, Discussed with Patient

## 2018-07-24 NOTE — Progress Notes (Signed)
Hematology/Oncology follow up note [facility] [phone] [phone]   Patient Care Team: Paulene Floor as PCP - General (Physician Assistant) Telford Nab, RN as Registered Nurse Clent Jacks, RN as Registered Nurse  REFERRING PROVIDER: Trinna Post, PA-C  CHIEF COMPLAINTS/REASON FOR VISIT:  Evaluation of lung nodule in the liver lesion  HISTORY OF PRESENTING ILLNESS:   Samuel Latulippe. is a  69 y.o.  male with PMH listed below was seen in consultation at the request of  Pollak, Adriana M, PA-C  for evaluation of lung nodule in the liver lesion. Patient has a past medical history of hypertension, alcohol abuse, smoking emphysema.  He has had serial CTs done in the past.  CT images were independently reviewed by me. 07/26/2017 CT chest with contrast showed extensive pleural-parenchymal scarring within the left upper lobe with several areas of soft tissue nodularity measuring up to 1.6 cm.  In this patient who is at increased risk of lung cancer further investigation with PET scan is recommended. Small chronic appearance loculated hydropneumothorax overlies the left apex. Slowly enlarging lesion within the central portion of the right lobe of liver identified. Per note, patient supposed to have additional work-up done in December repeat CT scan which he did not  Patient was recently seen by primary care provider and has subsequent image done for follow-up. CT on 06/30/2018 showed multiple significantly enlarged paraspinal mass are noted concerning.  The largest measures 3.8 x 0.5 cm and there appears to be a lytic destruction of the adjacent T11 vertebral body.  Also noted is new solid density measuring 17 x 12 mm in the pleural parenchymal density in the left upper lobe noted on prior exam.  Concerning for malignancy.  4.4 ascending thoracic aortic aneurysm.  Recommend annual imaging.  Patient had PET scan done on  07/06/2018 Which showed there are 2 FDG avid lesion within the left upper lobe worrisome for primary lung neoplasm.  There is evidence of chest wall involvement.  Metastasis to the posterior mediastinum is identified with extension into T11 vertebra and possible involvement of the left T12 neural foramina.  Patient reports left shoulder pain.  Smoking half a pack a day not motivated with further questioning quitting Denies weight loss, hemoptysis, cough.  Chronic shortness of breath with exertion. He drinks alcohol daily. Lives with his girlfriend.  He has 5 adult kids   INTERVAL HISTORY Samuel Hood. is a 68 y.o. male who has above history reviewed by me today presents for follow up visit for management of newly diagnosed metastatic hepatocellular carcinoma.   Problems and complaints are listed below: During the interval, patient underwent CT-guided biopsy of left-sided posterior mediastinal soft tissue adjacent to T10/11. Today patient denies any pain. He continues to drink alcohol.  Hard liquor and or 3 beers daily  Patient lives with girlfriend.  He has talked to his daughter and told her that he probably had cancer. Chronic shortness of breath unchanged.  Newly diagnosed hepatitis C  Review of Systems  Constitutional: Positive for fatigue. Negative for appetite change, chills, fever and unexpected weight change.  HENT:   Negative for hearing loss and voice change.   Eyes: Negative for eye problems and icterus.  Respiratory: Positive for shortness of breath. Negative for chest tightness and cough.   Cardiovascular: Negative for chest pain and leg swelling.  Gastrointestinal: Negative for abdominal distention, abdominal pain and blood in stool.  Endocrine: Negative for hot flashes.  Genitourinary: Negative for difficulty urinating, dysuria and frequency.   Musculoskeletal: Negative for arthralgias.  Skin: Negative for itching and rash.  Neurological: Negative for extremity  weakness, light-headedness and numbness.  Hematological: Negative for adenopathy. Does not bruise/bleed easily.  Psychiatric/Behavioral: Negative for confusion.    MEDICAL HISTORY:  Past Medical History:  Diagnosis Date   History of angiography    left lower extremity   Hypertension    Small bowel obstruction ([place])     SURGICAL HISTORY: Past Surgical History:  Procedure Laterality Date   COLON SURGERY     COLONOSCOPY W/ POLYPECTOMY     COLONOSCOPY WITH PROPOFOL N/A 01/31/2018   Procedure: COLONOSCOPY WITH PROPOFOL;  Surgeon: Toledo, Benay Pike, MD;  Location: [facility] ENDOSCOPY;  Service: Gastroenterology;  Laterality: N/A;   debridement fasciotomy leg, left Left 03/19/2016   LAPAROTOMY N/A 09/27/2014   Procedure: EXPLORATORY LAPAROTOMY;  Surgeon: Dia Crawford III, MD;  Location: [facility] ORS;  Service: General;  Laterality: N/A;    SOCIAL HISTORY: Social History   Socioeconomic History   Marital status: Single    Spouse name: Not on file   Number of children: Not on file   Years of education: Not on file   Highest education level: Not on file  Occupational History   Not on file  Social Needs   Financial resource strain: Hard   Food insecurity:    Worry: Never true    Inability: Never true   Transportation needs:    Medical: No    Non-medical: No  Tobacco Use   Smoking status: Current Every Day Smoker    Packs/day: 0.50    Years: 50.00    Pack years: 25.00   Smokeless tobacco: Never Used  Substance and Sexual Activity   Alcohol use: Yes    Alcohol/week: 35.0 standard drinks    Types: 5 Shots of liquor, 30 Cans of beer per week   Drug use: No   Sexual activity: Not on file  Lifestyle   Physical activity:    Days per week: 7 days    Minutes per session: 30 min   Stress: Not at all  Relationships   Social connections:    Talks on phone: More than three times a week    Gets together: More than three times a week    Attends religious service:  More than 4 times per year    Active member of club or organization: Not on file    Attends meetings of clubs or organizations: Not on file    Relationship status: Not on file   Intimate partner violence:    Fear of current or ex partner: Patient refused    Emotionally abused: No    Physically abused: No    Forced sexual activity: No  Other Topics Concern   Not on file  Social History Narrative   Not on file    FAMILY HISTORY: Family History  Problem Relation Age of Onset   Cancer Mother    Cancer Father     ALLERGIES:  has No Known Allergies.  MEDICATIONS:  Current Outpatient Medications  Medication Sig Dispense Refill   amLODipine (NORVASC) 5 MG tablet Take 1 tablet (5 mg total) by mouth daily. 90 tablet 0   ibuprofen (ADVIL,MOTRIN) 200 MG tablet Take 200 mg by mouth daily as needed.     Multiple Vitamin (MULTIVITAMIN) capsule Take 1 capsule by mouth daily.     naproxen (NAPROSYN) 500 MG tablet Take 500 mg by mouth 2 (two)  times daily with a meal.     No current facility-administered medications for this visit.      PHYSICAL EXAMINATION: ECOG PERFORMANCE STATUS: 1 - Symptomatic but completely ambulatory Vitals:   07/24/18 1426  BP: 124/87  Pulse: (!) 109  Resp: 16  Temp: (!) 97 F (36.1 C)   Filed Weights   07/24/18 1426  Weight: 143 lb 8.3 oz (65.1 kg)    Physical Exam Constitutional:      General: He is not in acute distress. HENT:     Head: Normocephalic and atraumatic.  Eyes:     General: No scleral icterus.    Pupils: Pupils are equal, round, and reactive to light.  Neck:     Musculoskeletal: Normal range of motion and neck supple.  Cardiovascular:     Rate and Rhythm: Normal rate and regular rhythm.     Heart sounds: Normal heart sounds.  Pulmonary:     Effort: Pulmonary effort is normal. No respiratory distress.     Breath sounds: No wheezing.     Comments: Decreased breath sound bilaterally. Abdominal:     General: Bowel sounds  are normal. There is no distension.     Palpations: Abdomen is soft. There is no mass.     Tenderness: There is no abdominal tenderness.  Musculoskeletal: Normal range of motion.        General: No deformity.  Skin:    General: Skin is warm and dry.     Findings: No erythema or rash.  Neurological:     Mental Status: He is alert and oriented to person, place, and time.     Cranial Nerves: No cranial nerve deficit.     Coordination: Coordination normal.  Psychiatric:        Behavior: Behavior normal.        Thought Content: Thought content normal.     LABORATORY DATA:  I have reviewed the data as listed Lab Results  Component Value Date   WBC 6.3 07/17/2018   HGB 15.3 07/17/2018   HCT 44.4 07/17/2018   MCV 93.3 07/17/2018   PLT 319 07/17/2018   Recent Labs    07/26/17 1040 06/23/18 1100  NA 138 136  K 4.0 3.9  CL 101 99  CO2 26 23  GLUCOSE 110* 99  BUN 12 6*  CREATININE 0.76 0.81  CALCIUM 9.7 9.6  GFRNONAA >60 93  GFRAA >60 107  PROT  --  7.4  ALBUMIN  --  4.0  AST  --  63*  ALT  --  41  ALKPHOS  --  71  BILITOT  --  0.5   Iron/TIBC/Ferritin/ %Sat No results found for: IRON, TIBC, FERRITIN, IRONPCTSAT   RADIOGRAPHIC STUDIES: I have personally reviewed the radiological images as listed and agreed with the findings in the report.  Ct Chest Wo Contrast  Result Date: 06/30/2018 CLINICAL DATA:  Abnormal findings on diagnostic imaging of lung. EXAM: CT CHEST WITHOUT CONTRAST TECHNIQUE: Multidetector CT imaging of the chest was performed following the standard protocol without IV contrast. COMPARISON:  CT scan of July 26, 2017. FINDINGS: Cardiovascular: 4.4 cm ascending thoracic aortic aneurysm is noted. Atherosclerosis of thoracic aorta is noted. Coronary artery calcifications are noted. Normal cardiac size. No pericardial effusion is noted. Mediastinum/Nodes: The esophagus is unremarkable. Thyroid gland appears normal. Multiple enlarged masses are noted in the  perispinal region which are significantly increased in size compared to prior exam. The largest measures 3.8 x 3.5 cm in left paraspinal  region best seen on image number 116 of series 2. There appears to be lytic destruction of the adjacent vertebral body. Lungs/Pleura: No pneumothorax or pleural effusion is noted. Emphysematous disease is again noted in both lungs. Pleuroparenchymal densities are again noted anteriorly in the left upper lobe. There is new solid component measuring 17 x 12 mm, best seen on image number 52 of series 3. This is concerning for possible neoplasm or malignancy. Upper Abdomen: Stable complex low density is noted in right hepatic lobe compared to prior exam. No other abnormality seen in visualized portion of upper abdomen. Musculoskeletal: As noted above, there is new rounded lucency seen in anterior portion of T11 vertebral body concerning for metastatic disease. IMPRESSION: Multiple significantly enlarged paraspinal masses are noted concerning for malignancy or metastatic disease. The largest measures 3.8 x 3.5 cm, and there appears to be lytic destruction of the adjacent T11 vertebral body. Also noted is new solid density measuring 17 x 12 mm in the pleuroparenchymal densities in the left upper lobe noted on prior exam, concerning for malignancy. Further evaluation with PET scan is recommended. These results will be called to the ordering clinician or representative by the Radiologist Assistant, and communication documented in the PACS or zVision Dashboard. 4.4 cm ascending thoracic aortic aneurysm. Recommend annual imaging followup by CTA or MRA. This recommendation follows 2010 ACCF/AHA/AATS/ACR/ASA/SCA/SCAI/SIR/STS/SVM Guidelines for the Diagnosis and Management of Patients with Thoracic Aortic Disease. Circulation. 2010; 121: H829-H371. Aortic aneurysm NOS (ICD10-I71.9). Aortic Atherosclerosis (ICD10-I70.0) and Emphysema (ICD10-J43.9). Electronically Signed   By: Marijo Conception  M.D.   On: 06/30/2018 15:53   Mr Jeri Cos IR Contrast  Result Date: 07/20/2018 CLINICAL DATA:  Lung cancer.  Assessment for metastatic disease. EXAM: MRI HEAD WITHOUT AND WITH CONTRAST TECHNIQUE: Multiplanar, multiecho pulse sequences of the brain and surrounding structures were obtained without and with intravenous contrast. CONTRAST:  6 mL Gadavist COMPARISON:  None. FINDINGS: BRAIN: There is no acute infarct, acute hemorrhage or extra-axial collection. The midline structures are normal. No midline shift or other mass effect. Multifocal Gittins matter hyperintensity, most commonly due to chronic ischemic microangiopathy. The cerebral and cerebellar volume are age-appropriate. No hydrocephalus. Susceptibility-sensitive sequences show no chronic microhemorrhage or superficial siderosis. No abnormal contrast enhancement. VASCULAR: The major intracranial arterial and venous sinus flow voids are normal. SKULL AND UPPER CERVICAL SPINE: Calvarial bone marrow signal is normal. There is no skull base mass. Visualized upper cervical spine and soft tissues are normal. SINUSES/ORBITS: No fluid levels or advanced mucosal thickening. No mastoid or middle ear effusion. The orbits are normal. IMPRESSION: 1. No intracranial metastatic disease. 2. Mild chronic small vessel ischemia. Electronically Signed   By: Ulyses Jarred M.D.   On: 07/20/2018 00:15   Nm Pet Image Initial (pi) Skull Base To Thigh  Result Date: 07/07/2018 CLINICAL DATA:  Initial treatment strategy for lung cancer. EXAM: NUCLEAR MEDICINE PET SKULL BASE TO THIGH TECHNIQUE: 7.84 mCi F-18 FDG was injected intravenously. Full-ring PET imaging was performed from the skull base to thigh after the radiotracer. CT data was obtained and used for attenuation correction and anatomic localization. Fasting blood glucose: 98 mg/dl COMPARISON:  CT chest 06/30/2018 and CT chest 07/26/2017 FINDINGS: Mediastinal blood pool activity: SUV max 2.29 Liver activity: SUV max NA NECK:  No hypermetabolic lymph nodes in the neck. Incidental CT findings: none CHEST: There is no hypermetabolic axillary or supraclavicular lymph node. Advanced changes of bullous emphysema identified. Again seen is extensive pleuroparenchymal scarring within the left upper  lobe and progressive nodularity. -index nodule within the left upper lobe measures 1.3 cm and has an SUV max of 11.59. -subpleural nodule within the anterolateral left upper lobe with suspected chest wall involvement measures 2.6 x 1.5 cm and has an SUV max of 11.74. -within the left posterior mediastinum there is a soft tissue mass posterior to the aorta which measures 3.0 x 3.9 cm within SUV max of 4.95. -FDG avid lymph node posterior to the esophagus measures 1.5 cm and has an SUV max of 7.07. -small left internal mammary lymph node is mildly FDG avid measuring 0.8 cm within SUV max of 1.5, image 80/3. Incidental CT findings: Aortic atherosclerosis. Calcification in the LAD, RCA and left circumflex coronary arteries. ABDOMEN/PELVIS: No FDG uptake within the liver, pancreas, spleen, or adrenal glands. No hypermetabolic abdominopelvic lymph nodes identified. Incidental CT findings: Aortic atherosclerosis. No aneurysm. Gallstones. Partially calcified hypodense lesion within the liver measures 3.1 cm without FDG uptake. SKELETON: Lytic lesion within the T11 vertebra is favored to represent sequelae of direct extension from posterior mediastinal tumor. This measures 8.4 mm with an SUV max of 4.71, image 127/3. Possible tumor early extension into the left T11-12 neural foramina is identified, image 132/3. Incidental CT findings: none IMPRESSION: 1. There are 2 FDG avid lesions within the left upper lobe worrisome for primary lung neoplasm. There is evidence of chest wall involvement. Metastasis to the posterior mediastinum is identified with extension into the T11 vertebra and possible involvement of the left T11-12 neural foramina. Assuming non-small  cell histology imaging findings are compatible with [id number] or stage IVa disease. 2. Aortic Atherosclerosis (ICD10-I70.0) and Emphysema (ICD10-J43.9). 3. Coronary artery calcifications 4. Gallstones. Electronically Signed   By: Kerby Moors M.D.   On: 07/07/2018 08:51   Ct Biopsy  Result Date: 07/17/2018 CLINICAL DATA:  Left lung lesions localizing to the left upper lobe and soft tissue masses/lymphadenopathy in the posterior mediastinum predominantly in the left paraspinal region posterior to the descending thoracic aorta. EXAM: CT GUIDED CORE BIOPSY OF LEFT POSTERIOR MEDIASTINAL SOFT TISSUE MASS ANESTHESIA/SEDATION: 2.5 mg IV Versed; 75 mcg IV Fentanyl Total Moderate Sedation Time:  22 minutes. The patient's level of consciousness and physiologic status were continuously monitored during the procedure by Radiology nursing. PROCEDURE: The procedure risks, benefits, and alternatives were explained to the patient. Questions regarding the procedure were encouraged and answered. The patient understands and consents to the procedure. A time-out was performed prior to initiating the procedure. CT was initially performed in a prone position through the mid to lower chest. The left midthoracic paraspinal region was prepped with chlorhexidine in a sterile fashion, and a sterile drape was applied covering the operative field. A sterile gown and sterile gloves were used for the procedure. Local anesthesia was provided with 1% Lidocaine. Under CT guidance, a 17 gauge trocar needle was advanced to the level of left posterior mediastinal soft tissue. After confirming needle tip position, a total of 3 separate coaxial 18 gauge core biopsy samples were obtained and submitted in formalin. The outer needle was removed and additional CT imaging performed. COMPLICATIONS: None FINDINGS: Left-sided posterior mediastinal soft tissue adjacent to the thoracic spine was targeted at roughly the T10/11 level. Core biopsy yielded solid  tissue. Post biopsy imaging shows no evidence of pneumothorax or hemorrhage. IMPRESSION: CT-guided core biopsy performed of left posterior mediastinal soft tissue adjacent to the lower thoracic spine at the T10/11 level. Electronically Signed   By: Aletta Edouard M.D.   On: 07/17/2018  11:52      ASSESSMENT & PLAN:  1. Hepatocellular carcinoma metastatic to left lung ([place])   2. Goals of care, counseling/discussion   3. Paraspinal mass   4. Alcohol abuse   5. Chronic hepatitis C without hepatic coma [facility])    Pathology result was reviewed and discussed with patient. The diagnosis of metastatic hepatocellular carcinoma and care plan were discussed with patient in detail.  NCCN guidelines were reviewed and shared with patient.  We discussed that patient's condition is not curable The goal of treatment which is to palliate disease, disease related symptoms, improve quality of life and hopefully prolong life was highlighted in our discussion.  Chemotherapy education was provided.  We had discussed the composition of chemotherapy regimen, length of chemo cycle, duration of treatment and the time to assess response to treatment.    I explained to the patient the risks and benefits of Lenvatinib 12mg  daily including all but not limited to hypertension, diarrhea, nausea, vomiting, heart toxicities, arrhythmia, bleeding, neuro toxicities, electrolyte imbalance etc.  Patient voices understanding and willing to proceed chemotherapy.  I will consult pharmacist to look into coverage of lenvatinib. Check baseline tumor markers including AFP, CA19.9, CEA.   #Lung lesion, metastatic versus primary lung neoplasm. # Hepatitis C, newly diagnosed recommend patient to follow up with GI.  # Alcohol abuse, cessation was discussed with patient.  Supportive care measures are necessary for patient well-being and will be provided as necessary. We spent sufficient time to discuss many aspect of care, questions were  answered to patient's satisfaction.  #Paraspinal mass involving T11-12.  Patient has establish care with radiation oncology. #Refer to palliative care.   Orders Placed This Encounter  Procedures   AFP tumor marker    Standing Status:   Future    Number of Occurrences:   1    Standing Expiration Date:   07/24/2019   Cancer antigen 19-9    Standing Status:   Future    Number of Occurrences:   1    Standing Expiration Date:   07/24/2019   CEA    Standing Status:   Future    Number of Occurrences:   1    Standing Expiration Date:   07/24/2019    All questions were answered. The patient knows to call the clinic with any problems questions or concerns. I asked patient if he would like me to discuss with his family members and update them.  He declined.  cc Trinna Post, PA-C   Return of visit: To be determined. We spent sufficient time to discuss many aspect of care, questions were answered to patient's satisfaction. Total face to face encounter time for this patient visit was 45 min. >50% of the time was  spent in counseling and coordination of care.    Earlie Server, MD, PhD 07/24/2018

## 2018-07-25 ENCOUNTER — Telehealth: Payer: Self-pay | Admitting: Pharmacy Technician

## 2018-07-25 ENCOUNTER — Encounter: Payer: Self-pay | Admitting: Physician Assistant

## 2018-07-25 ENCOUNTER — Ambulatory Visit (INDEPENDENT_AMBULATORY_CARE_PROVIDER_SITE_OTHER): Payer: Medicare Other | Admitting: Physician Assistant

## 2018-07-25 ENCOUNTER — Telehealth: Payer: Self-pay | Admitting: Pharmacist

## 2018-07-25 ENCOUNTER — Other Ambulatory Visit: Payer: Self-pay

## 2018-07-25 VITALS — BP 131/89 | HR 97 | Temp 98.3°F | Ht 65.0 in | Wt 142.2 lb

## 2018-07-25 DIAGNOSIS — C220 Liver cell carcinoma: Secondary | ICD-10-CM

## 2018-07-25 DIAGNOSIS — C7802 Secondary malignant neoplasm of left lung: Secondary | ICD-10-CM

## 2018-07-25 DIAGNOSIS — I10 Essential (primary) hypertension: Secondary | ICD-10-CM

## 2018-07-25 DIAGNOSIS — R972 Elevated prostate specific antigen [PSA]: Secondary | ICD-10-CM | POA: Diagnosis not present

## 2018-07-25 DIAGNOSIS — K732 Chronic active hepatitis, not elsewhere classified: Secondary | ICD-10-CM | POA: Diagnosis not present

## 2018-07-25 LAB — CEA: CEA: 18.2 ng/mL — ABNORMAL HIGH (ref 0.0–4.7)

## 2018-07-25 LAB — AFP TUMOR MARKER: AFP, Serum, Tumor Marker: 30 ng/mL — ABNORMAL HIGH (ref 0.0–8.3)

## 2018-07-25 LAB — CANCER ANTIGEN 19-9: CA 19-9: 75 U/mL — ABNORMAL HIGH (ref 0–35)

## 2018-07-25 MED ORDER — LENVATINIB (12 MG DAILY DOSE) 3 X 4 MG PO CPPK: 12.0000 mg | ORAL_CAPSULE | Freq: Every day | ORAL | 0 refills | Status: DC

## 2018-07-25 NOTE — Telephone Encounter (Signed)
Oral Oncology Patient Advocate Encounter  Prior Authorization for [employer] has been approved.    PA# CMM Key [id number] Effective dates: 07/25/2018 through 07/25/2019.  Patients co-pay is $2990.14.  Due to high copay and no grant funding open, we will apply for assistance through the manufacturer.  That will be documented in another encounter.  Oral Oncology Clinic will continue to follow.   [place] Patient [place] Phone [phone] Fax [phone] 07/26/2018 8:17 AM

## 2018-07-25 NOTE — Progress Notes (Signed)
Patient: Samuel Hood. Male    DOB: August 10, 1950   68 y.o.   MRN: [id number] Visit Date: 07/25/2018  Today's Provider: Trinna Post, PA-C   Chief Complaint  Patient presents with  . Follow-up    HTN 1 month   Subjective:     HPI  Hypertension, follow-up:  BP Readings from Last 3 Encounters:  07/25/18 131/89  07/24/18 124/87  07/24/18 124/87    He was last seen for hypertension 1 months ago.  BP at that visit was 131/88. Management changes since that visit include changed bp medication to amlodipine. He reports good compliance with treatment. He is not having side effects.  He is exercising. Mowing yards He is adherent to low salt diet.   Outside blood pressures are pt doesn't have a monitor at home to check Bp. He is experiencing none.  Patient denies none.   Cardiovascular risk factors include hypertension.  Use of agents associated with hypertension: none.     Weight trend: stable Wt Readings from Last 3 Encounters:  07/25/18 142 lb 3.2 oz (64.5 kg)  07/24/18 143 lb 8.3 oz (65.1 kg)  07/24/18 143 lb 8.3 oz (65.1 kg)    Current diet: well balanced  Metastatic HCC: In the interim, he has been diagnosed with metastatic HCC. He understands there is no cure for this but plans to go forward with palliative chemotherapy and radiation. He has active hepatitis C.  ------------------------------------------------------------------------   No Known Allergies   Current Outpatient Medications:  .  amLODipine (NORVASC) 5 MG tablet, Take 1 tablet (5 mg total) by mouth daily., Disp: 90 tablet, Rfl: 0 .  ibuprofen (ADVIL,MOTRIN) 200 MG tablet, Take 200 mg by mouth daily as needed., Disp: , Rfl:  .  Lenvatinib 12 mg daily dose (LENVIMA) 3 x 4 MG capsule, Take 12 mg by mouth daily., Disp: 90 capsule, Rfl: 0 .  Multiple Vitamin (MULTIVITAMIN) capsule, Take 1 capsule by mouth daily., Disp: , Rfl:  .  naproxen (NAPROSYN) 500 MG tablet, Take 500 mg by mouth 2 (two)  times daily with a meal., Disp: , Rfl:   Review of Systems  Constitutional: Negative.   HENT: Negative.   Eyes: Negative.   Respiratory: Negative.   Cardiovascular: Negative.   Endocrine: Negative.   Genitourinary: Negative.   Musculoskeletal: Negative.   Skin: Negative.   Allergic/Immunologic: Negative.   Neurological: Negative.   Hematological: Negative.   Psychiatric/Behavioral: Negative.   All other systems reviewed and are negative.   Social History   Tobacco Use  . Smoking status: Current Every Day Smoker    Packs/day: 0.50    Years: 50.00    Pack years: 25.00  . Smokeless tobacco: Never Used  Substance Use Topics  . Alcohol use: Yes    Alcohol/week: 35.0 standard drinks    Types: 5 Shots of liquor, 30 Cans of beer per week      Objective:   BP 131/89 (BP Location: Left Arm, Patient Position: Sitting, Cuff Size: Large)   Pulse 97   Temp 98.3 F (36.8 C) (Oral)   Ht 5\' 5"  (1.651 m)   Wt 142 lb 3.2 oz (64.5 kg)   SpO2 96%   BMI 23.66 kg/m  Vitals:   07/25/18 1107  BP: 131/89  Pulse: 97  Temp: 98.3 F (36.8 C)  TempSrc: Oral  SpO2: 96%  Weight: 142 lb 3.2 oz (64.5 kg)  Height: 5\' 5"  (1.651 m)  Physical Exam Constitutional:      Appearance: Normal appearance.  Cardiovascular:     Rate and Rhythm: Normal rate and regular rhythm.     Heart sounds: Normal heart sounds.  Pulmonary:     Effort: Pulmonary effort is normal.     Breath sounds: Wheezing present.  Neurological:     Mental Status: He is alert and oriented to person, place, and time. Mental status is at baseline.  Psychiatric:        Mood and Affect: Mood normal.        Behavior: Behavior normal.         Assessment & Plan    1. Hepatocellular carcinoma metastatic to left lung [facility])  Being followed by oncology. I have had long discussion with him regarding advanced care planning, living wills, HCPOA, and DNR. He is going to think about these issues and hopefully he will be able  to touch base with oncology about this.   2. Essential hypertension  Controlled. Continue amlodipine 5 mg daily.   3. Elevated PSA  Mildly elevated, in the context of metastatic [place] likely not to be lethal for him. Patient does not want to follow up with this right now.   4. Chronic active hepatitis [facility])  Discussed patient with Dr. Allen Norris who reported treatment would likely be deferred if life expectancy is not long. I communicated this to patient but offered a referral if he would like. He would not currently, wants to focus on cancer treatments.   The entirety of the information documented in the History of Present Illness, Review of Systems and Physical Exam were personally obtained by me. Portions of this information were initially documented by Lynford Humphrey, CMA and reviewed by me for thoroughness and accuracy.   F/u 1 year.      Trinna Post, PA-C  [facility]

## 2018-07-25 NOTE — Telephone Encounter (Signed)
Oral Oncology Pharmacist Encounter  Received new prescription for Lenvima (lenvatinib) for the treatment of [place], planned duration until disease progression or unacceptable drug toxicity.  CMP from 06/23/2018 assessed, no relevant lab abnormalities. Recommend checking a more recent CMP and also checking a UA for proteinuria monitoring. BP from 07/25/2018 assessed, BP well controlled. Prescription dose and frequency assessed. Patient >60kg, qualifies for 12mg  HCC lenvatinib dosing.   Current medication list in Epic reviewed, no DDIs with lenvatinib identified:  Prescription has been e-scribed to the [facility] for benefits analysis and approval.  Oral Oncology Clinic will continue to follow for insurance authorization, copayment issues, initial counseling and start date.  Darl Pikes, PharmD, BCPS, [facility] Hematology/Oncology Clinical Pharmacist [facility] [place] [facility] [phone]  07/25/2018 3:23 PM

## 2018-07-25 NOTE — Patient Instructions (Signed)
Advance Directive    Advance directives are legal documents that let you make choices ahead of time about your health care and medical treatment in case you become unable to communicate for yourself. Advance directives are a way for you to communicate your wishes to family, friends, and health care providers. This can help convey your decisions about end-of-life care if you become unable to communicate.  Discussing and writing advance directives should happen over time rather than all at once. Advance directives can be changed depending on your situation and what you want, even after you have signed the advance directives.  If you do not have an advance directive, some states assign family decision makers to act on your behalf based on how closely you are related to them. Each state has its own laws regarding advance directives. You may want to check with your health care provider, attorney, or state representative about the laws in your state. There are different types of advance directives, such as:  · Medical power of attorney.  · Living will.  · Do not resuscitate (DNR) or do not attempt resuscitation (DNAR) order.  Health care proxy and medical power of attorney  A health care proxy, also called a health care agent, is a person who is appointed to make medical decisions for you in cases in which you are unable to make the decisions yourself. Generally, people choose someone they know well and trust to represent their preferences. Make sure to ask this person for an agreement to act as your proxy. A proxy may have to exercise judgment in the event of a medical decision for which your wishes are not known.  A medical power of attorney is a legal document that names your health care proxy. Depending on the laws in your state, after the document is written, it may also need to be:  · Signed.  · Notarized.  · Dated.  · Copied.  · Witnessed.  · Incorporated into your medical record.  You may also want to appoint  someone to manage your financial affairs in a situation in which you are unable to do so. This is called a durable power of attorney for finances. It is a separate legal document from the durable power of attorney for health care. You may choose the same person or someone different from your health care proxy to act as your agent in financial matters.  If you do not appoint a proxy, or if there is a concern that the proxy is not acting in your best interests, a court-appointed guardian may be designated to act on your behalf.  Living will  A living will is a set of instructions documenting your wishes about medical care when you cannot express them yourself. Health care providers should keep a copy of your living will in your medical record. You may want to give a copy to family members or friends. To alert caregivers in case of an emergency, you can place a card in your wallet to let them know that you have a living will and where they can find it. A living will is used if you become:  · Terminally ill.  · Incapacitated.  · Unable to communicate or make decisions.  Items to consider in your living will include:  · The use or non-use of life-sustaining equipment, such as dialysis machines and breathing machines (ventilators).  · A DNR or DNAR order, which is the instruction not to use cardiopulmonary resuscitation (CPR) if breathing or   heartbeat stops.  · The use or non-use of tube feeding.  · Withholding of food and fluids.  · Comfort (palliative) care when the goal becomes comfort rather than a cure.  · Organ and tissue donation.  A living will does not give instructions for distributing your money and property if you should pass away. It is recommended that you seek the advice of a lawyer when writing a will. Decisions about taxes, beneficiaries, and asset distribution will be legally binding. This process can relieve your family and friends of any concerns surrounding disputes or questions that may come up about  the distribution of your assets.  DNR or DNAR  A DNR or DNAR order is a request not to have CPR in the event that your heart stops beating or you stop breathing. If a DNR or DNAR order has not been made and shared, a health care provider will try to help any patient whose heart has stopped or who has stopped breathing. If you plan to have surgery, talk with your health care provider about how your DNR or DNAR order will be followed if problems occur.  Summary  · Advance directives are the legal documents that allow you to make choices ahead of time about your health care and medical treatment in case you become unable to communicate for yourself.  · The process of discussing and writing advance directives should happen over time. You can change the advance directives, even after you have signed them.  · Advance directives include DNR or DNAR orders, living wills, and designating an agent as your medical power of attorney.  This information is not intended to replace advice given to you by your health care provider. Make sure you discuss any questions you have with your health care provider.  Document Released: 05/11/2007 Document Revised: 12/22/2015 Document Reviewed: 12/22/2015  Elsevier Interactive Patient Education © 2019 [employer]vier Inc[employer]

## 2018-07-25 NOTE — Telephone Encounter (Signed)
Oral Oncology Patient Advocate Encounter  Received notification from [facility] that prior authorization for Lenvima is required.  PA submitted on CoverMyMeds Key [id number] Status is pending  Oral Oncology Clinic will continue to follow.  [place] Patient [place] Phone [phone] Fax [phone] 07/25/2018 4:01 PM

## 2018-07-26 ENCOUNTER — Telehealth: Payer: Self-pay | Admitting: Pharmacy Technician

## 2018-07-26 ENCOUNTER — Other Ambulatory Visit: Payer: Self-pay

## 2018-07-26 ENCOUNTER — Ambulatory Visit
Admission: RE | Admit: 2018-07-26 | Discharge: 2018-07-26 | Disposition: A | Discharge status: Home / Self Care | Payer: Medicare Other | Source: Ambulatory Visit | Attending: Radiation Oncology | Admitting: Radiation Oncology

## 2018-07-26 DIAGNOSIS — C220 Liver cell carcinoma: Secondary | ICD-10-CM | POA: Diagnosis not present

## 2018-07-26 DIAGNOSIS — C7951 Secondary malignant neoplasm of bone: Secondary | ICD-10-CM | POA: Insufficient documentation

## 2018-07-26 DIAGNOSIS — Z510 Encounter for antineoplastic radiation therapy: Secondary | ICD-10-CM | POA: Insufficient documentation

## 2018-07-27 ENCOUNTER — Other Ambulatory Visit: Payer: Medicare Other

## 2018-07-27 NOTE — Progress Notes (Signed)
Tumor Board Documentation  Donyae Kilner. was presented by Dr Janese Banks at our Tumor Board on 07/27/2018, which included representatives from medical oncology, radiation oncology, surgical oncology, surgical, radiology, pathology, internal medicine, navigation, research, palliative care.  Theodoro currently presents as a new patient, for [place], for new positive pathology with history of the following treatments: active survellience.  Additionally, we reviewed previous medical and familial history, history of present illness, and recent lab results along with all available histopathologic and imaging studies. The tumor board considered available treatment options and made the following recommendations: Biopsy Biopsy Liver lesion, Refer to Radiation Oncology  The following procedures/referrals were also placed: No orders of the defined types were placed in this encounter.   Clinical Trial Status: not discussed   Staging used: To be determined  AJCC Staging:       Group: Hepatocellular Carcinoma vs Hepatoid Carcinoma   National site-specific guidelines   were discussed with respect to the case.  Tumor board is a meeting of clinicians from various specialty areas who evaluate and discuss patients for whom a multidisciplinary approach is being considered. Final determinations in the plan of care are those of the provider(s). The responsibility for follow up of recommendations given during tumor board is that of the provider.   Today's extended care, comprehensive team conference, Constantin was not present for the discussion and was not examined.   Multidisciplinary Tumor Board is a multidisciplinary case peer review process.  Decisions discussed in the Multidisciplinary Tumor Board reflect the opinions of the specialists present at the conference without having examined the patient.  Ultimately, treatment and diagnostic decisions rest with the primary provider(s) and the patient.

## 2018-07-28 ENCOUNTER — Other Ambulatory Visit: Payer: Self-pay | Admitting: *Deleted

## 2018-07-28 DIAGNOSIS — K732 Chronic active hepatitis, not elsewhere classified: Secondary | ICD-10-CM | POA: Insufficient documentation

## 2018-07-28 DIAGNOSIS — R972 Elevated prostate specific antigen [PSA]: Secondary | ICD-10-CM | POA: Insufficient documentation

## 2018-07-28 DIAGNOSIS — R222 Localized swelling, mass and lump, trunk: Secondary | ICD-10-CM

## 2018-07-31 ENCOUNTER — Telehealth: Payer: Self-pay

## 2018-07-31 ENCOUNTER — Other Ambulatory Visit: Payer: Self-pay | Admitting: Oncology

## 2018-07-31 DIAGNOSIS — Z510 Encounter for antineoplastic radiation therapy: Secondary | ICD-10-CM | POA: Diagnosis not present

## 2018-07-31 DIAGNOSIS — C220 Liver cell carcinoma: Secondary | ICD-10-CM | POA: Diagnosis not present

## 2018-07-31 DIAGNOSIS — R160 Hepatomegaly, not elsewhere classified: Secondary | ICD-10-CM

## 2018-07-31 DIAGNOSIS — C7951 Secondary malignant neoplasm of bone: Secondary | ICD-10-CM | POA: Diagnosis not present

## 2018-07-31 NOTE — Telephone Encounter (Signed)
Oral Oncology Patient Advocate Encounter  Faxed application for assistance to [facility] on 07/26/2018 for Lenvima.  I received a phone call from an [place] representative on 07/27/2018 that they had received the application and would follow up in 2 business days with an update.  Called [employer] today, 07/31/2018, and the rep stated that they were waiting to receive the patients copay from [place].  I stated that we had completed a test claim and had the copay amount and relayed that information to her.  She updated the patients file and stated that they will continue to process the application and will fax with the approval or denial.  I will continue to update this encounter.  Samuel Hood Patient [place] Phone [phone] Fax [phone] 07/31/2018 11:24 AM

## 2018-07-31 NOTE — Telephone Encounter (Signed)
Called and notified Samuel Hood with recommendations for liver biopsy after case was discussed at tumor board. Invasive checklist sent to special scheuduling. Dr. Tasia Catchings has placed order.

## 2018-08-02 ENCOUNTER — Ambulatory Visit
Admission: RE | Admit: 2018-08-02 | Discharge: 2018-08-02 | Disposition: A | Discharge status: Home / Self Care | Payer: Medicare Other | Source: Ambulatory Visit | Attending: Radiation Oncology | Admitting: Radiation Oncology

## 2018-08-02 ENCOUNTER — Other Ambulatory Visit: Payer: Self-pay

## 2018-08-02 DIAGNOSIS — C220 Liver cell carcinoma: Secondary | ICD-10-CM | POA: Diagnosis not present

## 2018-08-02 DIAGNOSIS — Z510 Encounter for antineoplastic radiation therapy: Secondary | ICD-10-CM | POA: Diagnosis not present

## 2018-08-02 DIAGNOSIS — C7951 Secondary malignant neoplasm of bone: Secondary | ICD-10-CM | POA: Diagnosis not present

## 2018-08-03 ENCOUNTER — Telehealth: Payer: Self-pay | Admitting: Pharmacist

## 2018-08-03 ENCOUNTER — Ambulatory Visit
Admission: RE | Admit: 2018-08-03 | Discharge: 2018-08-03 | Disposition: A | Discharge status: Home / Self Care | Payer: Medicare Other | Source: Ambulatory Visit | Attending: Radiation Oncology | Admitting: Radiation Oncology

## 2018-08-03 ENCOUNTER — Other Ambulatory Visit: Payer: Self-pay

## 2018-08-03 ENCOUNTER — Telehealth: Payer: Self-pay

## 2018-08-03 DIAGNOSIS — Z510 Encounter for antineoplastic radiation therapy: Secondary | ICD-10-CM | POA: Diagnosis not present

## 2018-08-03 DIAGNOSIS — C220 Liver cell carcinoma: Secondary | ICD-10-CM | POA: Diagnosis not present

## 2018-08-03 DIAGNOSIS — C7951 Secondary malignant neoplasm of bone: Secondary | ICD-10-CM | POA: Diagnosis not present

## 2018-08-03 NOTE — Telephone Encounter (Signed)
Called to check the status of application for Lenvima.  Patient is approved for assistance from 07/31/2018-02/15/2019.  Representative stated that patient will be called today to set up shipment of medication.    Will follow up until patient receives medication.  [place] Patient [place] Phone [phone] Fax [phone] 08/03/2018 10:00 AM

## 2018-08-03 NOTE — Telephone Encounter (Signed)
Call returned to Samuel Hood. He has received his oral lenvatinib. Per Dr. Tasia Catchings he is to not start them until he is instructed to. He states he will put them to the side for now.He will have his liver biopsy on 6/23. I advised him that he will need to have his COVID-19 testing repeated. I will instruct him when he arrives for radiation in the morning where to go.

## 2018-08-03 NOTE — Telephone Encounter (Signed)
Oral Chemotherapy Pharmacist Encounter   Patient call the clinic with a question about his medication. I returned his call and he let me know that his [place] was delivered by the assistance program today. I instructed him to put the Lenvima aside until his next appt with Dr. Tasia Catchings. She will provide him with additional information about when to start taking the [place]. He started his understanding.  Darl Pikes, PharmD, BCPS, [facility] Hematology/Oncology Clinical Pharmacist [facility] [place] [facility] [phone]  08/03/2018 1:52 PM

## 2018-08-03 NOTE — Telephone Encounter (Signed)
Patient received medication today.  Alyson instructed patient to not start and to bring to his next appointment.  MD will instruct him when to start.

## 2018-08-04 ENCOUNTER — Other Ambulatory Visit: Payer: Self-pay

## 2018-08-04 ENCOUNTER — Other Ambulatory Visit
Admission: RE | Admit: 2018-08-04 | Discharge: 2018-08-04 | Disposition: A | Discharge status: Home / Self Care | Payer: Medicare Other | Source: Ambulatory Visit | Attending: Oncology | Admitting: Oncology

## 2018-08-04 ENCOUNTER — Ambulatory Visit
Admission: RE | Admit: 2018-08-04 | Discharge: 2018-08-04 | Disposition: A | Discharge status: Home / Self Care | Payer: Medicare Other | Source: Ambulatory Visit | Attending: Radiation Oncology | Admitting: Radiation Oncology

## 2018-08-04 DIAGNOSIS — Z1159 Encounter for screening for other viral diseases: Secondary | ICD-10-CM | POA: Insufficient documentation

## 2018-08-04 DIAGNOSIS — C220 Liver cell carcinoma: Secondary | ICD-10-CM | POA: Diagnosis not present

## 2018-08-04 DIAGNOSIS — Z510 Encounter for antineoplastic radiation therapy: Secondary | ICD-10-CM | POA: Diagnosis not present

## 2018-08-04 DIAGNOSIS — C7951 Secondary malignant neoplasm of bone: Secondary | ICD-10-CM | POA: Diagnosis not present

## 2018-08-04 NOTE — Progress Notes (Signed)
Spoke with Samuel Hood at the end of his radiation. He was instructed to go over and get his covid-19 test at the [facility] in preparation for his liver biopsy.

## 2018-08-05 LAB — NOVEL CORONAVIRUS, NAA (HOSP ORDER, SEND-OUT TO REF LAB; TAT 18-24 HRS): SARS-CoV-2, NAA: NOT DETECTED

## 2018-08-07 ENCOUNTER — Other Ambulatory Visit: Payer: Self-pay

## 2018-08-07 ENCOUNTER — Other Ambulatory Visit: Payer: Self-pay | Admitting: Radiology

## 2018-08-07 ENCOUNTER — Ambulatory Visit
Admission: RE | Admit: 2018-08-07 | Discharge: 2018-08-07 | Disposition: A | Discharge status: Home / Self Care | Payer: Medicare Other | Source: Ambulatory Visit | Attending: Radiation Oncology | Admitting: Radiation Oncology

## 2018-08-07 DIAGNOSIS — C7951 Secondary malignant neoplasm of bone: Secondary | ICD-10-CM | POA: Diagnosis not present

## 2018-08-07 DIAGNOSIS — C220 Liver cell carcinoma: Secondary | ICD-10-CM | POA: Diagnosis not present

## 2018-08-07 DIAGNOSIS — Z510 Encounter for antineoplastic radiation therapy: Secondary | ICD-10-CM | POA: Diagnosis not present

## 2018-08-08 ENCOUNTER — Other Ambulatory Visit: Payer: Self-pay

## 2018-08-08 ENCOUNTER — Ambulatory Visit
Admission: RE | Admit: 2018-08-08 | Discharge: 2018-08-08 | Disposition: A | Discharge status: Home / Self Care | Payer: Medicare Other | Source: Ambulatory Visit | Attending: Oncology | Admitting: Oncology

## 2018-08-08 ENCOUNTER — Ambulatory Visit
Admission: RE | Admit: 2018-08-08 | Discharge: 2018-08-08 | Disposition: A | Discharge status: Home / Self Care | Payer: Medicare Other | Source: Ambulatory Visit | Attending: Radiation Oncology | Admitting: Radiation Oncology

## 2018-08-08 DIAGNOSIS — C220 Liver cell carcinoma: Secondary | ICD-10-CM | POA: Insufficient documentation

## 2018-08-08 DIAGNOSIS — I10 Essential (primary) hypertension: Secondary | ICD-10-CM | POA: Diagnosis not present

## 2018-08-08 DIAGNOSIS — R932 Abnormal findings on diagnostic imaging of liver and biliary tract: Secondary | ICD-10-CM | POA: Diagnosis not present

## 2018-08-08 DIAGNOSIS — R160 Hepatomegaly, not elsewhere classified: Secondary | ICD-10-CM | POA: Insufficient documentation

## 2018-08-08 DIAGNOSIS — Z510 Encounter for antineoplastic radiation therapy: Secondary | ICD-10-CM | POA: Diagnosis not present

## 2018-08-08 DIAGNOSIS — Z7901 Long term (current) use of anticoagulants: Secondary | ICD-10-CM | POA: Diagnosis not present

## 2018-08-08 DIAGNOSIS — Z87891 Personal history of nicotine dependence: Secondary | ICD-10-CM | POA: Diagnosis not present

## 2018-08-08 DIAGNOSIS — C771 Secondary and unspecified malignant neoplasm of intrathoracic lymph nodes: Secondary | ICD-10-CM | POA: Diagnosis not present

## 2018-08-08 DIAGNOSIS — C7951 Secondary malignant neoplasm of bone: Secondary | ICD-10-CM | POA: Diagnosis not present

## 2018-08-08 DIAGNOSIS — Z79899 Other long term (current) drug therapy: Secondary | ICD-10-CM | POA: Insufficient documentation

## 2018-08-08 LAB — CBC
HCT: 43.1 % (ref 39.0–52.0)
Hemoglobin: 14.6 g/dL (ref 13.0–17.0)
MCH: 31.1 pg (ref 26.0–34.0)
MCHC: 33.9 g/dL (ref 30.0–36.0)
MCV: 91.9 fL (ref 80.0–100.0)
Platelets: 356 10*3/uL (ref 150–400)
RBC: 4.69 MIL/uL (ref 4.22–5.81)
RDW: 14.2 % (ref 11.5–15.5)
WBC: 5.7 10*3/uL (ref 4.0–10.5)
nRBC: 0 % (ref 0.0–0.2)

## 2018-08-08 LAB — PROTIME-INR
INR: 1 (ref 0.8–1.2)
Prothrombin Time: 13.4 seconds (ref 11.4–15.2)

## 2018-08-08 MED ORDER — FENTANYL CITRATE (PF) 100 MCG/2ML IJ SOLN
INTRAMUSCULAR | Status: AC
  Filled 2018-08-08: qty 2

## 2018-08-08 MED ORDER — FENTANYL CITRATE (PF) 100 MCG/2ML IJ SOLN
INTRAMUSCULAR | Status: AC | PRN
  Administered 2018-08-08 (×2): 50 ug via INTRAVENOUS

## 2018-08-08 MED ORDER — MIDAZOLAM HCL 5 MG/5ML IJ SOLN
INTRAMUSCULAR | Status: AC
  Filled 2018-08-08: qty 5

## 2018-08-08 MED ORDER — SODIUM CHLORIDE 0.9 % IV SOLN
INTRAVENOUS | Status: DC
  Administered 2018-08-08: 11:00:00 via INTRAVENOUS

## 2018-08-08 MED ORDER — MIDAZOLAM HCL 2 MG/2ML IJ SOLN
INTRAMUSCULAR | Status: AC | PRN
  Administered 2018-08-08 (×2): 1 mg via INTRAVENOUS

## 2018-08-08 NOTE — Procedures (Signed)
Met HCC  S/p [place] CENTRAL RIGHT LIVER MASS BX  No comp Stable EBL min  Path pending Full report in pacs

## 2018-08-08 NOTE — H&P (Signed)
Chief Complaint:   Met HCC in the chest, here for liver lesion bx  Referring Physician(s): Yu,Zhou   History of Present Illness: Samuel Hood. is a 68 y.o. male with met [place] in the left chest.  Receiving XRT to the chest.  Indeterminate central right liver lesion Plan for US liver lesion bx today.  No compalaints. ROS neg.  Past Medical History:  Diagnosis Date  . Hepatocellular carcinoma metastatic to left lung ([name]) 07/24/2018  . History of angiography    left lower extremity  . Hypertension   . Small bowel obstruction [facility])     Past Surgical History:  Procedure Laterality Date  . COLON SURGERY    . COLONOSCOPY W/ POLYPECTOMY    . COLONOSCOPY WITH PROPOFOL N/A 01/31/2018   Procedure: COLONOSCOPY WITH PROPOFOL;  Surgeon: Toledo, Benay Pike, MD;  Location: [facility] ENDOSCOPY;  Service: Gastroenterology;  Laterality: N/A;  . debridement fasciotomy leg, left Left 03/19/2016  . LAPAROTOMY N/A 09/27/2014   Procedure: EXPLORATORY LAPAROTOMY;  Surgeon: Dia Crawford III, MD;  Location: [facility] ORS;  Service: General;  Laterality: N/A;    Allergies: Patient has no known allergies.  Medications: Prior to Admission medications   Medication Sig Start Date End Date Taking? Authorizing Provider  amLODipine (NORVASC) 5 MG tablet Take 1 tablet (5 mg total) by mouth daily. 06/23/18  Yes Carles Collet M, PA-C  ibuprofen (ADVIL,MOTRIN) 200 MG tablet Take 200 mg by mouth daily as needed.   Yes [provider]  Lenvatinib 12 mg daily dose (LENVIMA) 3 x 4 MG capsule Take 12 mg by mouth daily. 07/25/18  Yes Earlie Server, MD  Multiple Vitamin (MULTIVITAMIN) capsule Take 1 capsule by mouth daily.   Yes [provider]  naproxen (NAPROSYN) 500 MG tablet Take 500 mg by mouth 2 (two) times daily with a meal.    [provider]     Family History  Problem Relation Age of Onset  . Cancer Mother   . Cancer Father     Social History   Socioeconomic History  . Marital status:  Single    Spouse name: Not on file  . Number of children: Not on file  . Years of education: Not on file  . Highest education level: Not on file  Occupational History  . Occupation: retired  Scientific laboratory technician  . Financial resource strain: Hard  . Food insecurity    Worry: Never true    Inability: Never true  . Transportation needs    Medical: No    Non-medical: No  Tobacco Use  . Smoking status: Former Smoker    Packs/day: 0.25    Years: 50.00    Pack years: 12.50  . Smokeless tobacco: Never Used  . Tobacco comment: 1-2/week  Substance and Sexual Activity  . Alcohol use: Yes    Alcohol/week: 35.0 standard drinks    Types: 30 Cans of beer, 5 Shots of liquor per week  . Drug use: No  . Sexual activity: Not on file  Lifestyle  . Physical activity    Days per week: 7 days    Minutes per session: 30 min  . Stress: Not at all  Relationships  . Social connections    Talks on phone: More than three times a week    Gets together: More than three times a week    Attends religious service: More than 4 times per year    Active member of club or organization: Not on file  Attends meetings of clubs or organizations: Not on file    Relationship status: Not on file  Other Topics Concern  . Not on file  Social History Narrative  . Not on file    ECOG Status: 1 - Symptomatic but completely ambulatory  Review of Systems: A 12 point ROS discussed and pertinent positives are indicated in the HPI above.  All other systems are negative.  Review of Systems  Vital Signs: BP 105/80 (BP Location: Left Arm)   Pulse 89   Temp 98 F (36.7 C) (Oral)   Resp 20   Ht '5\' 5"'$  (1.651 m)   Wt 65.8 kg   SpO2 100%   BMI 24.13 kg/m   Physical Exam Constitutional:      General: He is not in acute distress.    Appearance: He is not toxic-appearing.  Eyes:     General: No scleral icterus.    Conjunctiva/sclera: Conjunctivae normal.  Cardiovascular:     Rate and Rhythm: Normal rate and  regular rhythm.  Pulmonary:     Effort: Pulmonary effort is normal.     Breath sounds: Normal breath sounds.  Abdominal:     General: Bowel sounds are normal.     Palpations: Abdomen is soft.  Musculoskeletal: Normal range of motion.  Skin:    Coloration: Skin is not jaundiced.  Neurological:     General: No focal deficit present.     Mental Status: Mental status is at baseline.  Psychiatric:        Mood and Affect: Mood normal.     Imaging: Mr Jeri Cos Wo Contrast  Result Date: 07/20/2018 CLINICAL DATA:  Lung cancer.  Assessment for metastatic disease. EXAM: MRI HEAD WITHOUT AND WITH CONTRAST TECHNIQUE: Multiplanar, multiecho pulse sequences of the brain and surrounding structures were obtained without and with intravenous contrast. CONTRAST:  6 mL Gadavist COMPARISON:  None. FINDINGS: BRAIN: There is no acute infarct, acute hemorrhage or extra-axial collection. The midline structures are normal. No midline shift or other mass effect. Multifocal [place] matter hyperintensity, most commonly due to chronic ischemic microangiopathy. The cerebral and cerebellar volume are age-appropriate. No hydrocephalus. Susceptibility-sensitive sequences show no chronic microhemorrhage or superficial siderosis. No abnormal contrast enhancement. VASCULAR: The major intracranial arterial and venous sinus flow voids are normal. SKULL AND UPPER CERVICAL SPINE: Calvarial bone marrow signal is normal. There is no skull base mass. Visualized upper cervical spine and soft tissues are normal. SINUSES/ORBITS: No fluid levels or advanced mucosal thickening. No mastoid or middle ear effusion. The orbits are normal. IMPRESSION: 1. No intracranial metastatic disease. 2. Mild chronic small vessel ischemia. Electronically Signed   By: Ulyses Jarred M.D.   On: 07/20/2018 00:15   Ct Biopsy  Result Date: 07/17/2018 CLINICAL DATA:  Left lung lesions localizing to the left upper lobe and soft tissue masses/lymphadenopathy in the  posterior mediastinum predominantly in the left paraspinal region posterior to the descending thoracic aorta. EXAM: CT GUIDED CORE BIOPSY OF LEFT POSTERIOR MEDIASTINAL SOFT TISSUE MASS ANESTHESIA/SEDATION: 2.5 mg IV Versed; 75 mcg IV Fentanyl Total Moderate Sedation Time:  22 minutes. The patient's level of consciousness and physiologic status were continuously monitored during the procedure by Radiology nursing. PROCEDURE: The procedure risks, benefits, and alternatives were explained to the patient. Questions regarding the procedure were encouraged and answered. The patient understands and consents to the procedure. A time-out was performed prior to initiating the procedure. CT was initially performed in a prone position through the mid to lower chest.  The left midthoracic paraspinal region was prepped with chlorhexidine in a sterile fashion, and a sterile drape was applied covering the operative field. A sterile gown and sterile gloves were used for the procedure. Local anesthesia was provided with 1% Lidocaine. Under CT guidance, a 17 gauge trocar needle was advanced to the level of left posterior mediastinal soft tissue. After confirming needle tip position, a total of 3 separate coaxial 18 gauge core biopsy samples were obtained and submitted in formalin. The outer needle was removed and additional CT imaging performed. COMPLICATIONS: None FINDINGS: Left-sided posterior mediastinal soft tissue adjacent to the thoracic spine was targeted at roughly the T10/11 level. Core biopsy yielded solid tissue. Post biopsy imaging shows no evidence of pneumothorax or hemorrhage. IMPRESSION: CT-guided core biopsy performed of left posterior mediastinal soft tissue adjacent to the lower thoracic spine at the T10/11 level. Electronically Signed   By: Aletta Edouard M.D.   On: 07/17/2018 11:52    Labs:  CBC: Recent Labs    06/23/18 1100 07/17/18 0725 08/08/18 1013  WBC 5.6 6.3 5.7  HGB 14.6 15.3 14.6  HCT 43.2  44.4 43.1  PLT 297 319 356    COAGS: Recent Labs    07/17/18 0725 08/08/18 1013  INR 1.0 1.0  APTT 36  --     BMP: Recent Labs    06/23/18 1100  NA 136  K 3.9  CL 99  CO2 23  GLUCOSE 99  BUN 6*  CALCIUM 9.6  CREATININE 0.81  GFRNONAA 93  GFRAA 107    LIVER FUNCTION TESTS: Recent Labs    06/23/18 1100  BILITOT 0.5  AST 63*  ALT 41  ALKPHOS 71  PROT 7.4  ALBUMIN 4.0    TUMOR MARKERS: No results for input(s): AFPTM, CEA, CA199, CHROMGRNA in the last 8760 hours.  Assessment and Plan:  Indeterminate central right liver mass, but has met [place] in the chest.  Plan for US liver lesion bx today.    Risks and benefits of US liver mass bx was discussed with the patient and/or patient's family including, but not limited to bleeding, infection, damage to adjacent structures or low yield requiring additional tests.  All of the questions were answered and there is agreement to proceed.  Consent signed and in chart.    Thank you for this interesting consult.  I greatly enjoyed meeting Charan Prieto. and look forward to participating in their care.  A copy of this report was sent to the requesting provider on this date.  Electronically Signed: Greggory Keen, MD 08/08/2018, 12:02 PM   I spent a total of  30 Minutes   in face to face in clinical consultation, greater than 50% of which was counseling/coordinating care for this patient with met [place].

## 2018-08-09 ENCOUNTER — Other Ambulatory Visit: Payer: Self-pay

## 2018-08-09 ENCOUNTER — Ambulatory Visit
Admission: RE | Admit: 2018-08-09 | Discharge: 2018-08-09 | Disposition: A | Discharge status: Home / Self Care | Payer: Medicare Other | Source: Ambulatory Visit | Attending: Radiation Oncology | Admitting: Radiation Oncology

## 2018-08-09 ENCOUNTER — Inpatient Hospital Stay: Payer: Medicare Other

## 2018-08-09 DIAGNOSIS — C220 Liver cell carcinoma: Secondary | ICD-10-CM | POA: Diagnosis not present

## 2018-08-09 DIAGNOSIS — C7951 Secondary malignant neoplasm of bone: Secondary | ICD-10-CM | POA: Diagnosis not present

## 2018-08-09 DIAGNOSIS — Z510 Encounter for antineoplastic radiation therapy: Secondary | ICD-10-CM | POA: Diagnosis not present

## 2018-08-09 DIAGNOSIS — R222 Localized swelling, mass and lump, trunk: Secondary | ICD-10-CM

## 2018-08-10 ENCOUNTER — Other Ambulatory Visit: Payer: Self-pay

## 2018-08-10 ENCOUNTER — Ambulatory Visit
Admission: RE | Admit: 2018-08-10 | Discharge: 2018-08-10 | Disposition: A | Discharge status: Home / Self Care | Payer: Medicare Other | Source: Ambulatory Visit | Attending: Radiation Oncology | Admitting: Radiation Oncology

## 2018-08-10 ENCOUNTER — Telehealth: Payer: Self-pay | Admitting: *Deleted

## 2018-08-10 DIAGNOSIS — C220 Liver cell carcinoma: Secondary | ICD-10-CM | POA: Diagnosis not present

## 2018-08-10 DIAGNOSIS — C7951 Secondary malignant neoplasm of bone: Secondary | ICD-10-CM | POA: Diagnosis not present

## 2018-08-10 DIAGNOSIS — Z510 Encounter for antineoplastic radiation therapy: Secondary | ICD-10-CM | POA: Diagnosis not present

## 2018-08-10 LAB — SURGICAL PATHOLOGY

## 2018-08-10 NOTE — Telephone Encounter (Signed)
Called pt to inform that Dr. Tasia Catchings would like to see him sooner than his scheduled follow up on 7/1 to discuss biopsy results. Appt has been rescheduled to Friday 6/26 at 10:15am after radiation. Pt has been made aware of appt change.

## 2018-08-11 ENCOUNTER — Ambulatory Visit
Admission: RE | Admit: 2018-08-11 | Discharge: 2018-08-11 | Disposition: A | Discharge status: Home / Self Care | Payer: Medicare Other | Source: Ambulatory Visit | Attending: Radiation Oncology | Admitting: Radiation Oncology

## 2018-08-11 ENCOUNTER — Other Ambulatory Visit: Payer: Self-pay

## 2018-08-11 ENCOUNTER — Inpatient Hospital Stay (HOSPITAL_BASED_OUTPATIENT_CLINIC_OR_DEPARTMENT_OTHER): Payer: Medicare Other | Admitting: Oncology

## 2018-08-11 ENCOUNTER — Encounter: Payer: Self-pay | Admitting: *Deleted

## 2018-08-11 ENCOUNTER — Encounter: Payer: Self-pay | Admitting: Oncology

## 2018-08-11 VITALS — BP 107/76 | HR 99 | Temp 96.8°F | Resp 18 | Wt 140.6 lb

## 2018-08-11 DIAGNOSIS — Z79899 Other long term (current) drug therapy: Secondary | ICD-10-CM

## 2018-08-11 DIAGNOSIS — R5383 Other fatigue: Secondary | ICD-10-CM

## 2018-08-11 DIAGNOSIS — I10 Essential (primary) hypertension: Secondary | ICD-10-CM | POA: Diagnosis not present

## 2018-08-11 DIAGNOSIS — C7951 Secondary malignant neoplasm of bone: Secondary | ICD-10-CM | POA: Diagnosis not present

## 2018-08-11 DIAGNOSIS — J439 Emphysema, unspecified: Secondary | ICD-10-CM

## 2018-08-11 DIAGNOSIS — I712 Thoracic aortic aneurysm, without rupture: Secondary | ICD-10-CM | POA: Diagnosis not present

## 2018-08-11 DIAGNOSIS — R911 Solitary pulmonary nodule: Secondary | ICD-10-CM

## 2018-08-11 DIAGNOSIS — F1010 Alcohol abuse, uncomplicated: Secondary | ICD-10-CM

## 2018-08-11 DIAGNOSIS — M25512 Pain in left shoulder: Secondary | ICD-10-CM

## 2018-08-11 DIAGNOSIS — J948 Other specified pleural conditions: Secondary | ICD-10-CM

## 2018-08-11 DIAGNOSIS — F17210 Nicotine dependence, cigarettes, uncomplicated: Secondary | ICD-10-CM

## 2018-08-11 DIAGNOSIS — Z791 Long term (current) use of non-steroidal anti-inflammatories (NSAID): Secondary | ICD-10-CM | POA: Diagnosis not present

## 2018-08-11 DIAGNOSIS — B182 Chronic viral hepatitis C: Secondary | ICD-10-CM

## 2018-08-11 DIAGNOSIS — Z510 Encounter for antineoplastic radiation therapy: Secondary | ICD-10-CM | POA: Diagnosis not present

## 2018-08-11 DIAGNOSIS — Z7189 Other specified counseling: Secondary | ICD-10-CM

## 2018-08-11 DIAGNOSIS — C220 Liver cell carcinoma: Secondary | ICD-10-CM

## 2018-08-11 DIAGNOSIS — R222 Localized swelling, mass and lump, trunk: Secondary | ICD-10-CM

## 2018-08-11 DIAGNOSIS — R918 Other nonspecific abnormal finding of lung field: Secondary | ICD-10-CM

## 2018-08-11 NOTE — Progress Notes (Signed)
  Oncology Nurse Navigator Documentation  Navigator Location: [facility]Med Onc (08/11/18 1300)   )Navigator Encounter Type: Follow-up Appt (08/11/18 1300)                     Patient Visit Type: MedOnc (08/11/18 1300)   Barriers/Navigation Needs: Coordination of Care (08/11/18 1300)   Interventions: Coordination of Care (08/11/18 1300)   Coordination of Care: Appts;Radiology (08/11/18 1300)             met with patient during follow up visit with Dr. Tasia Catchings to discuss recent biopsy results. All questions answered during visit. Informed pt that he will be notified by myself once scheduled for covid testing and biopsy. Pt verbalized understanding. Nothing further needed at this time.      Time Spent with Patient: 30 (08/11/18 1300)

## 2018-08-11 NOTE — Progress Notes (Addendum)
Hematology/Oncology follow up note [facility] [phone] [phone]   Patient Care Team: Paulene Floor as PCP - General (Physician Assistant) Telford Nab, RN as Registered Nurse Clent Jacks, RN as Registered Nurse  REFERRING PROVIDER: Trinna Post, PA-C  CHIEF COMPLAINTS/REASON FOR VISIT:  Discussion of metastatic cancer management.  HISTORY OF PRESENTING ILLNESS:   Samuel Hood. is a  68 y.o.  male with PMH listed below was seen in consultation at the request of  Terrilee Croak, Adriana M, PA-C  for evaluation of lung nodule in the liver lesion. Patient has a past medical history of hypertension, alcohol abuse, smoking emphysema.  He has had serial CTs done in the past.  CT images were independently reviewed by me. 07/26/2017 CT chest with contrast showed extensive pleural-parenchymal scarring within the left upper lobe with several areas of soft tissue nodularity measuring up to 1.6 cm.  In this patient who is at increased risk of lung cancer further investigation with PET scan is recommended. Small chronic appearance loculated hydropneumothorax overlies the left apex. Slowly enlarging lesion within the central portion of the right lobe of liver identified. Per note, patient supposed to have additional work-up done in December repeat CT scan which he did not  Patient was recently seen by primary care provider and has subsequent image done for follow-up. CT on 06/30/2018 showed multiple significantly enlarged paraspinal mass are noted concerning.  The largest measures 3.8 x 0.5 cm and there appears to be a lytic destruction of the adjacent T11 vertebral body.  Also noted is new solid density measuring 17 x 12 mm in the pleural parenchymal density in the left upper lobe noted on prior exam.  Concerning for malignancy.  4.4 ascending thoracic aortic aneurysm.  Recommend annual imaging.  Patient had PET scan done on  07/06/2018 Which showed there are 2 FDG avid lesion within the left upper lobe worrisome for primary lung neoplasm.  There is evidence of chest wall involvement.  Metastasis to the posterior mediastinum is identified with extension into T11 vertebra and possible involvement of the left T12 neural foramina.  Patient reports left shoulder pain.  Smoking half a pack a day not motivated with further questioning quitting Denies weight loss, hemoptysis, cough.  Chronic shortness of breath with exertion. He drinks alcohol daily. Lives with his girlfriend.  He has 5 adult kids  #Hepatitis C  INTERVAL HISTORY Samuel Hood. is a 68 y.o. male who has above history reviewed by me today presents for follow up visit for management of newly diagnosed metastatic carcinoma.  # CT-guided biopsy of left-sided posterior mediastinal soft tissue adjacent to T10/11. Patient's case was discussed on tumor board. Consensus was reached that [facility] versus primary lung cancer with hepatoid adenocarcinoma features. Consensus was to proceed with liver biopsy first as liver mass was not FDG avid on PET scan.  Patient underwent liver biopsy and the present to discuss pathology reports. He reports feeling all right at baseline.  Chronic shortness of breath at baseline.  Patient has started palliative radiation to paraspinal soft tissue mass.  Review of Systems  Constitutional: Positive for fatigue. Negative for appetite change, chills, fever and unexpected weight change.  HENT:   Negative for hearing loss and voice change.   Eyes: Negative for eye problems and icterus.  Respiratory: Positive for shortness of breath. Negative for chest tightness and cough.   Cardiovascular: Negative for chest pain and leg swelling.  Gastrointestinal: Negative for  abdominal distention, abdominal pain and blood in stool.  Endocrine: Negative for hot flashes.  Genitourinary: Negative for difficulty urinating, dysuria and frequency.    Musculoskeletal: Negative for arthralgias.  Skin: Negative for itching and rash.  Neurological: Negative for extremity weakness, light-headedness and numbness.  Hematological: Negative for adenopathy. Does not bruise/bleed easily.  Psychiatric/Behavioral: Negative for confusion.    MEDICAL HISTORY:  Past Medical History:  Diagnosis Date   Hepatocellular carcinoma metastatic to left lung ([place]) 07/24/2018   History of angiography    left lower extremity   Hypertension    Small bowel obstruction ([place])     SURGICAL HISTORY: Past Surgical History:  Procedure Laterality Date   COLON SURGERY     COLONOSCOPY W/ POLYPECTOMY     COLONOSCOPY WITH PROPOFOL N/A 01/31/2018   Procedure: COLONOSCOPY WITH PROPOFOL;  Surgeon: Toledo, Benay Pike, MD;  Location: [facility] ENDOSCOPY;  Service: Gastroenterology;  Laterality: N/A;   debridement fasciotomy leg, left Left 03/19/2016   LAPAROTOMY N/A 09/27/2014   Procedure: EXPLORATORY LAPAROTOMY;  Surgeon: Dia Crawford III, MD;  Location: [facility] ORS;  Service: General;  Laterality: N/A;    SOCIAL HISTORY: Social History   Socioeconomic History   Marital status: Single    Spouse name: Not on file   Number of children: Not on file   Years of education: Not on file   Highest education level: Not on file  Occupational History   Occupation: retired  Scientist, product/process development strain: Hard   Food insecurity    Worry: Never true    Inability: Never true   Transportation needs    Medical: No    Non-medical: No  Tobacco Use   Smoking status: Former Smoker    Packs/day: 0.25    Years: 50.00    Pack years: 12.50   Smokeless tobacco: Never Used   Tobacco comment: 1-2/week  Substance and Sexual Activity   Alcohol use: Yes    Alcohol/week: 35.0 standard drinks    Types: 30 Cans of beer, 5 Shots of liquor per week   Drug use: No   Sexual activity: Not on file  Lifestyle   Physical activity    Days per week: 7 days     Minutes per session: 30 min   Stress: Not at all  Relationships   Social connections    Talks on phone: More than three times a week    Gets together: More than three times a week    Attends religious service: More than 4 times per year    Active member of club or organization: Not on file    Attends meetings of clubs or organizations: Not on file    Relationship status: Not on file   Intimate partner violence    Fear of current or ex partner: Patient refused    Emotionally abused: No    Physically abused: No    Forced sexual activity: No  Other Topics Concern   Not on file  Social History Narrative   Not on file    FAMILY HISTORY: Family History  Problem Relation Age of Onset   Cancer Mother    Cancer Father     ALLERGIES:  has No Known Allergies.  MEDICATIONS:  Current Outpatient Medications  Medication Sig Dispense Refill   amLODipine (NORVASC) 5 MG tablet Take 1 tablet (5 mg total) by mouth daily. 90 tablet 0   ibuprofen (ADVIL,MOTRIN) 200 MG tablet Take 200 mg by mouth daily as needed.  Lenvatinib 12 mg daily dose (LENVIMA) 3 x 4 MG capsule Take 12 mg by mouth daily. 90 capsule 0   Multiple Vitamin (MULTIVITAMIN) capsule Take 1 capsule by mouth daily.     naproxen (NAPROSYN) 500 MG tablet Take 500 mg by mouth 2 (two) times daily with a meal.     No current facility-administered medications for this visit.      PHYSICAL EXAMINATION: ECOG PERFORMANCE STATUS: 1 - Symptomatic but completely ambulatory Vitals:   08/11/18 0920  BP: 107/76  Pulse: 99  Resp: 18  Temp: (!) 96.8 F (36 C)  SpO2: 99%   Filed Weights   08/11/18 0920  Weight: 140 lb 9.6 oz (63.8 kg)    Physical Exam Constitutional:      General: He is not in acute distress. HENT:     Head: Normocephalic and atraumatic.  Eyes:     General: No scleral icterus.    Pupils: Pupils are equal, round, and reactive to light.  Neck:     Musculoskeletal: Normal range of motion and neck  supple.  Cardiovascular:     Rate and Rhythm: Normal rate and regular rhythm.     Heart sounds: Normal heart sounds.  Pulmonary:     Effort: Pulmonary effort is normal. No respiratory distress.     Breath sounds: No wheezing.     Comments: Decreased breath sound bilaterally. Abdominal:     General: Bowel sounds are normal. There is no distension.     Palpations: Abdomen is soft. There is no mass.     Tenderness: There is no abdominal tenderness.  Musculoskeletal: Normal range of motion.        General: No deformity.  Skin:    General: Skin is warm and dry.     Findings: No erythema or rash.  Neurological:     Mental Status: He is alert and oriented to person, place, and time.     Cranial Nerves: No cranial nerve deficit.     Coordination: Coordination normal.  Psychiatric:        Behavior: Behavior normal.        Thought Content: Thought content normal.     LABORATORY DATA:  I have reviewed the data as listed Lab Results  Component Value Date   WBC 5.7 08/08/2018   HGB 14.6 08/08/2018   HCT 43.1 08/08/2018   MCV 91.9 08/08/2018   PLT 356 08/08/2018   Recent Labs    06/23/18 1100  NA 136  K 3.9  CL 99  CO2 23  GLUCOSE 99  BUN 6*  CREATININE 0.81  CALCIUM 9.6  GFRNONAA 93  GFRAA 107  PROT 7.4  ALBUMIN 4.0  AST 63*  ALT 41  ALKPHOS 71  BILITOT 0.5   Iron/TIBC/Ferritin/ %Sat No results found for: IRON, TIBC, FERRITIN, IRONPCTSAT   RADIOGRAPHIC STUDIES: I have personally reviewed the radiological images as listed and agreed with the findings in the report.  Mr Jeri Cos Wo Contrast  Result Date: 07/20/2018 CLINICAL DATA:  Lung cancer.  Assessment for metastatic disease. EXAM: MRI HEAD WITHOUT AND WITH CONTRAST TECHNIQUE: Multiplanar, multiecho pulse sequences of the brain and surrounding structures were obtained without and with intravenous contrast. CONTRAST:  6 mL Gadavist COMPARISON:  None. FINDINGS: BRAIN: There is no acute infarct, acute hemorrhage or  extra-axial collection. The midline structures are normal. No midline shift or other mass effect. Multifocal Inocencio matter hyperintensity, most commonly due to chronic ischemic microangiopathy. The cerebral and cerebellar volume are age-appropriate.  No hydrocephalus. Susceptibility-sensitive sequences show no chronic microhemorrhage or superficial siderosis. No abnormal contrast enhancement. VASCULAR: The major intracranial arterial and venous sinus flow voids are normal. SKULL AND UPPER CERVICAL SPINE: Calvarial bone marrow signal is normal. There is no skull base mass. Visualized upper cervical spine and soft tissues are normal. SINUSES/ORBITS: No fluid levels or advanced mucosal thickening. No mastoid or middle ear effusion. The orbits are normal. IMPRESSION: 1. No intracranial metastatic disease. 2. Mild chronic small vessel ischemia. Electronically Signed   By: Ulyses Jarred M.D.   On: 07/20/2018 00:15   US Biopsy (liver)  Result Date: 08/08/2018 INDICATION: Metastatic hepatocellular carcinoma in the chest EXAM: ULTRASOUND GUIDED CORE BIOPSY OF CENTRAL INDETERMINATE RIGHT LIVER MASS MEDICATIONS: 1% LIDOCAINE LOCAL ANESTHESIA/SEDATION: Versed 2.0mg  IV; Fentanyl 117mcg IV; Moderate Sedation Time:  10 MINUTES The patient was continuously monitored during the procedure by the interventional radiology nurse under my direct supervision. FLUOROSCOPY TIME:  Fluoroscopy Time: None. COMPLICATIONS: None immediate. PROCEDURE: The procedure, risks, benefits, and alternatives were explained to the patient. Questions regarding the procedure were encouraged and answered. The patient understands and consents to the procedure. Previous imaging reviewed. Preliminary ultrasound performed. The central right hepatic mass was localized with ultrasound and marked for a right subcostal approach. Under sterile conditions and local anesthesia, a 17 gauge 11.8 cm coaxial guide was advanced to the lesion. Needle position confirmed with  ultrasound. 18 gauge core biopsies obtained. Samples placed in formalin. Images obtained for documentation. Needle removed. Postprocedure imaging demonstrates no hemorrhage or hematoma. Patient tolerated biopsy well. FINDINGS: Imaging confirms needle placement to the central right liver mass for core biopsy IMPRESSION: Successful ultrasound core biopsy of the central right liver mass. Electronically Signed   By: Jerilynn Mages.  Shick M.D.   On: 08/08/2018 12:19   Ct Biopsy  Result Date: 07/17/2018 CLINICAL DATA:  Left lung lesions localizing to the left upper lobe and soft tissue masses/lymphadenopathy in the posterior mediastinum predominantly in the left paraspinal region posterior to the descending thoracic aorta. EXAM: CT GUIDED CORE BIOPSY OF LEFT POSTERIOR MEDIASTINAL SOFT TISSUE MASS ANESTHESIA/SEDATION: 2.5 mg IV Versed; 75 mcg IV Fentanyl Total Moderate Sedation Time:  22 minutes. The patient's level of consciousness and physiologic status were continuously monitored during the procedure by Radiology nursing. PROCEDURE: The procedure risks, benefits, and alternatives were explained to the patient. Questions regarding the procedure were encouraged and answered. The patient understands and consents to the procedure. A time-out was performed prior to initiating the procedure. CT was initially performed in a prone position through the mid to lower chest. The left midthoracic paraspinal region was prepped with chlorhexidine in a sterile fashion, and a sterile drape was applied covering the operative field. A sterile gown and sterile gloves were used for the procedure. Local anesthesia was provided with 1% Lidocaine. Under CT guidance, a 17 gauge trocar needle was advanced to the level of left posterior mediastinal soft tissue. After confirming needle tip position, a total of 3 separate coaxial 18 gauge core biopsy samples were obtained and submitted in formalin. The outer needle was removed and additional CT imaging  performed. COMPLICATIONS: None FINDINGS: Left-sided posterior mediastinal soft tissue adjacent to the thoracic spine was targeted at roughly the T10/11 level. Core biopsy yielded solid tissue. Post biopsy imaging shows no evidence of pneumothorax or hemorrhage. IMPRESSION: CT-guided core biopsy performed of left posterior mediastinal soft tissue adjacent to the lower thoracic spine at the T10/11 level. Electronically Signed   By: Jenness Corner.D.  On: 07/17/2018 11:52      ASSESSMENT & PLAN:  1. Lung mass    Ultrasound liver mass biopsy pathology was reviewed and discussed with patient. It showed fragments of necrotic and calcified tissue with adjacent fibrous capsule.  Scant viable non-neoplastic liver tissue with steatosis, nonspecific chronic inflammation and nonspecific fibrosis.  Discussed with patient that I recommend to proceed with CT-guided lung lesion biopsy. Case was discussed at tumor board, and there was concern about mediastinal mass being lung cancer with hepatoid feature. Per pathology, can not distinguish HCC vs lung cancer with hepatoid adenocarcinoma features.  We discussed about the importance of clarification of pathology prior to proceeding with treatments. Patient was initially reluctant to get another biopsy as he had a bad experience with the COVID testing prior to the biopsy. We had a lengthy discussion and he agrees with the plan.  Per patient's request, I also called the patient's daughter Kaige Whistler via her cell phone [phone].  Daughter lives close to [place]. I updated patient's daughter with above plan in details.  She appreciates explanation and agrees with the plan.  # Hepatitis C, newly diagnosed need to follow-up with GI. #Alcohol abuse, per daughter patient has stopped drinking alcohol.   Supportive care measures are necessary for patient well-being and will be provided as necessary. We spent sufficient time to discuss many aspect of care,  questions were answered to patient's satisfaction.  #Paraspinal mass involving T11-12.  Patient is undergoing palliative radiation. #Goal of care was discussed with both patient and daughter today. #Refer to palliative care.   Orders Placed This Encounter  Procedures   CT Biopsy    Standing Status:   Future    Standing Expiration Date:   08/11/2019    Order Specific Question:   Lab orders requested (DO NOT place separate lab orders, these will be automatically ordered during procedure specimen collection):    Answer:   Surgical Pathology    Order Specific Question:   Reason for Exam (SYMPTOM  OR DIAGNOSIS REQUIRED)    Answer:   left upper lobe lung lesion    Order Specific Question:   Preferred location?    Answer:   [facility]    Order Specific Question:   Radiology Contrast Protocol - do NOT remove file path    Answer:   \[email or web address]    All questions were answered. The patient knows to call the clinic with any problems questions or concerns.  cc Trinna Post, PA-C   Return of visit: To be determined. We spent sufficient time to discuss many aspect of care, questions were answered to patient's satisfaction. Total face to face encounter time for this patient visit was 25 min. >50% of the time was  spent in counseling and coordination of care.    Earlie Server, MD, PhD 08/11/2018

## 2018-08-14 ENCOUNTER — Ambulatory Visit
Admission: RE | Admit: 2018-08-14 | Discharge: 2018-08-14 | Disposition: A | Discharge status: Home / Self Care | Payer: Medicare Other | Source: Ambulatory Visit | Attending: Radiation Oncology | Admitting: Radiation Oncology

## 2018-08-14 ENCOUNTER — Other Ambulatory Visit: Payer: Self-pay

## 2018-08-14 DIAGNOSIS — C220 Liver cell carcinoma: Secondary | ICD-10-CM | POA: Diagnosis not present

## 2018-08-14 DIAGNOSIS — Z510 Encounter for antineoplastic radiation therapy: Secondary | ICD-10-CM | POA: Diagnosis not present

## 2018-08-14 DIAGNOSIS — C7951 Secondary malignant neoplasm of bone: Secondary | ICD-10-CM | POA: Diagnosis not present

## 2018-08-15 ENCOUNTER — Other Ambulatory Visit: Payer: Self-pay

## 2018-08-15 ENCOUNTER — Ambulatory Visit
Admission: RE | Admit: 2018-08-15 | Discharge: 2018-08-15 | Disposition: A | Discharge status: Home / Self Care | Payer: Medicare Other | Source: Ambulatory Visit | Attending: Radiation Oncology | Admitting: Radiation Oncology

## 2018-08-15 DIAGNOSIS — C220 Liver cell carcinoma: Secondary | ICD-10-CM | POA: Diagnosis not present

## 2018-08-15 DIAGNOSIS — Z510 Encounter for antineoplastic radiation therapy: Secondary | ICD-10-CM | POA: Diagnosis not present

## 2018-08-15 DIAGNOSIS — C7951 Secondary malignant neoplasm of bone: Secondary | ICD-10-CM | POA: Diagnosis not present

## 2018-08-16 ENCOUNTER — Ambulatory Visit: Payer: Medicare Other | Admitting: Oncology

## 2018-08-16 ENCOUNTER — Ambulatory Visit
Admission: RE | Admit: 2018-08-16 | Discharge: 2018-08-16 | Disposition: A | Discharge status: Home / Self Care | Payer: Medicare Other | Source: Ambulatory Visit | Attending: Radiation Oncology | Admitting: Radiation Oncology

## 2018-08-16 ENCOUNTER — Other Ambulatory Visit: Payer: Self-pay

## 2018-08-16 DIAGNOSIS — C220 Liver cell carcinoma: Secondary | ICD-10-CM | POA: Diagnosis not present

## 2018-08-16 DIAGNOSIS — Z510 Encounter for antineoplastic radiation therapy: Secondary | ICD-10-CM | POA: Diagnosis not present

## 2018-08-16 DIAGNOSIS — C7951 Secondary malignant neoplasm of bone: Secondary | ICD-10-CM | POA: Insufficient documentation

## 2018-08-21 ENCOUNTER — Other Ambulatory Visit: Payer: Self-pay | Admitting: *Deleted

## 2018-08-21 ENCOUNTER — Encounter: Payer: Self-pay | Admitting: *Deleted

## 2018-08-21 ENCOUNTER — Other Ambulatory Visit: Payer: Medicare Other

## 2018-08-21 NOTE — Progress Notes (Addendum)
  Oncology Nurse Navigator Documentation  Navigator Location: [facility] (08/21/18 1300)   )Navigator Encounter Type: Telephone (08/21/18 1300) Telephone: Lahoma Crocker Call (08/21/18 1300)                       Barriers/Navigation Needs: Coordination of Care (08/21/18 1300)   Interventions: Coordination of Care (08/21/18 1300)   Coordination of Care: Appts;Radiology (08/21/18 1300)       phone call made to patient to review upcoming appts for COVID screening, biopsy, and follow up scheduled with Dr. Tasia Catchings. Informed pt that will mail appts as well. Instructed to call back with any further questions or needs. Pt verbalized understanding. Nothing further needed at this time.            Time Spent with Patient: 30 (08/21/18 1300)

## 2018-08-23 ENCOUNTER — Other Ambulatory Visit: Payer: Self-pay

## 2018-08-23 ENCOUNTER — Other Ambulatory Visit
Admission: RE | Admit: 2018-08-23 | Discharge: 2018-08-23 | Disposition: A | Discharge status: Home / Self Care | Payer: Medicare Other | Source: Ambulatory Visit | Attending: Oncology | Admitting: Oncology

## 2018-08-23 ENCOUNTER — Ambulatory Visit: Admission: RE | Admit: 2018-08-23 | Payer: Medicare Other | Source: Ambulatory Visit

## 2018-08-23 DIAGNOSIS — Z1159 Encounter for screening for other viral diseases: Secondary | ICD-10-CM | POA: Diagnosis not present

## 2018-08-23 DIAGNOSIS — Z01812 Encounter for preprocedural laboratory examination: Secondary | ICD-10-CM | POA: Diagnosis not present

## 2018-08-23 LAB — SARS CORONAVIRUS 2 (TAT 6-24 HRS): SARS Coronavirus 2: NEGATIVE

## 2018-08-24 ENCOUNTER — Other Ambulatory Visit: Payer: Self-pay | Admitting: Radiology

## 2018-08-25 ENCOUNTER — Other Ambulatory Visit: Payer: Self-pay | Admitting: Student

## 2018-08-28 ENCOUNTER — Other Ambulatory Visit: Payer: Self-pay

## 2018-08-28 ENCOUNTER — Ambulatory Visit
Admission: RE | Admit: 2018-08-28 | Discharge: 2018-08-28 | Disposition: A | Discharge status: Home / Self Care | Payer: Medicare Other | Source: Ambulatory Visit | Attending: Oncology | Admitting: Oncology

## 2018-08-28 ENCOUNTER — Ambulatory Visit: Payer: Medicare Other

## 2018-08-28 DIAGNOSIS — C220 Liver cell carcinoma: Secondary | ICD-10-CM | POA: Insufficient documentation

## 2018-08-28 DIAGNOSIS — R918 Other nonspecific abnormal finding of lung field: Secondary | ICD-10-CM

## 2018-08-28 DIAGNOSIS — Z87891 Personal history of nicotine dependence: Secondary | ICD-10-CM | POA: Diagnosis not present

## 2018-08-28 DIAGNOSIS — Z79899 Other long term (current) drug therapy: Secondary | ICD-10-CM | POA: Insufficient documentation

## 2018-08-28 DIAGNOSIS — I10 Essential (primary) hypertension: Secondary | ICD-10-CM | POA: Insufficient documentation

## 2018-08-28 DIAGNOSIS — Z86010 Personal history of colonic polyps: Secondary | ICD-10-CM | POA: Insufficient documentation

## 2018-08-28 DIAGNOSIS — C3412 Malignant neoplasm of upper lobe, left bronchus or lung: Secondary | ICD-10-CM | POA: Insufficient documentation

## 2018-08-28 DIAGNOSIS — J948 Other specified pleural conditions: Secondary | ICD-10-CM | POA: Diagnosis not present

## 2018-08-28 LAB — PROTIME-INR
INR: 1 (ref 0.8–1.2)
Prothrombin Time: 12.9 seconds (ref 11.4–15.2)

## 2018-08-28 LAB — CBC
HCT: 42.2 % (ref 39.0–52.0)
Hemoglobin: 14.4 g/dL (ref 13.0–17.0)
MCH: 30.6 pg (ref 26.0–34.0)
MCHC: 34.1 g/dL (ref 30.0–36.0)
MCV: 89.6 fL (ref 80.0–100.0)
Platelets: 259 10*3/uL (ref 150–400)
RBC: 4.71 MIL/uL (ref 4.22–5.81)
RDW: 13.9 % (ref 11.5–15.5)
WBC: 6.7 10*3/uL (ref 4.0–10.5)
nRBC: 0 % (ref 0.0–0.2)

## 2018-08-28 MED ORDER — FENTANYL CITRATE (PF) 100 MCG/2ML IJ SOLN
INTRAMUSCULAR | Status: AC | PRN
  Administered 2018-08-28: 50 ug via INTRAVENOUS
  Administered 2018-08-28: 25 ug via INTRAVENOUS

## 2018-08-28 MED ORDER — MIDAZOLAM HCL 5 MG/5ML IJ SOLN
INTRAMUSCULAR | Status: AC | PRN
  Administered 2018-08-28: 0.5 mg via INTRAVENOUS
  Administered 2018-08-28: 1 mg via INTRAVENOUS

## 2018-08-28 MED ORDER — HYDROCODONE-ACETAMINOPHEN 5-325 MG PO TABS: 1.0000 | ORAL_TABLET | ORAL | Status: DC | PRN

## 2018-08-28 MED ORDER — SODIUM CHLORIDE 0.9 % IV SOLN
INTRAVENOUS | Status: DC
  Administered 2018-08-28: 08:00:00 via INTRAVENOUS

## 2018-08-28 MED ORDER — MIDAZOLAM HCL 5 MG/5ML IJ SOLN
INTRAMUSCULAR | Status: AC
  Filled 2018-08-28: qty 5

## 2018-08-28 MED ORDER — FENTANYL CITRATE (PF) 100 MCG/2ML IJ SOLN
INTRAMUSCULAR | Status: AC
  Filled 2018-08-28: qty 4

## 2018-08-28 NOTE — Procedures (Signed)
Interventional Radiology Procedure Note  Procedure: CT Guided Biopsy of left upper lobe lung mass  Complications: None  Estimated Blood Loss: < 10 mL  Findings: 18 G core biopsy of anterior, subpleural left upper lobe mass performed under CT guidance.  Three core samples obtained and sent to Pathology.  Samuel Hood. Kathlene Cote, M.D Pager:  [phone]

## 2018-08-28 NOTE — H&P (Signed)
Chief Complaint: Patient was seen in consultation today for  at the request of Yu,Zhou  Referring Physician(s): Yu,Zhou  Patient Status: [facility] - Out-pt  History of Present Illness: Samuel Hood. is a 68 y.o. male known to our service from two prior biopsy procedures. He is status post CT guided biopsy of a left posterior mediastinal mass on 07/17/18 revealing HCC/hepatoid adenocarcinoma. US guided biopsy was performed of a central hepatic mass on 08/08/18 reviealing necrotic and calcified tissue. Now presents for biopsy of a LUL subpleural lung mass that was hypermetabolic on PET scan to determine if the lung lesion represents a primary lung CA or metastatic disease. Currently no complaints.  Past Medical History:  Diagnosis Date  . Hepatocellular carcinoma metastatic to left lung ([place]) 07/24/2018  . History of angiography    left lower extremity  . Hypertension   . Small bowel obstruction [facility])     Past Surgical History:  Procedure Laterality Date  . COLON SURGERY    . COLONOSCOPY W/ POLYPECTOMY    . COLONOSCOPY WITH PROPOFOL N/A 01/31/2018   Procedure: COLONOSCOPY WITH PROPOFOL;  Surgeon: Toledo, Benay Pike, MD;  Location: [facility] ENDOSCOPY;  Service: Gastroenterology;  Laterality: N/A;  . debridement fasciotomy leg, left Left 03/19/2016  . LAPAROTOMY N/A 09/27/2014   Procedure: EXPLORATORY LAPAROTOMY;  Surgeon: Dia Crawford III, MD;  Location: [facility] ORS;  Service: General;  Laterality: N/A;    Allergies: Patient has no known allergies.  Medications: Prior to Admission medications   Medication Sig Start Date End Date Taking? Authorizing Provider  amLODipine (NORVASC) 5 MG tablet Take 1 tablet (5 mg total) by mouth daily. 06/23/18  Yes Carles Collet M, PA-C  ibuprofen (ADVIL,MOTRIN) 200 MG tablet Take 200 mg by mouth daily as needed.   Yes [provider]  Lenvatinib 12 mg daily dose (LENVIMA) 3 x 4 MG capsule Take 12 mg by mouth daily. 07/25/18  Yes Earlie Server, MD  Multiple  Vitamin (MULTIVITAMIN) capsule Take 1 capsule by mouth daily.   Yes [provider]  naproxen (NAPROSYN) 500 MG tablet Take 500 mg by mouth 2 (two) times daily with a meal.   Yes [provider]     Family History  Problem Relation Age of Onset  . Cancer Mother   . Cancer Father     Social History   Socioeconomic History  . Marital status: Single    Spouse name: Not on file  . Number of children: Not on file  . Years of education: Not on file  . Highest education level: Not on file  Occupational History  . Occupation: retired  Scientific laboratory technician  . Financial resource strain: Hard  . Food insecurity    Worry: Never true    Inability: Never true  . Transportation needs    Medical: No    Non-medical: No  Tobacco Use  . Smoking status: Former Smoker    Packs/day: 0.25    Years: 50.00    Pack years: 12.50  . Smokeless tobacco: Never Used  . Tobacco comment: 1-2/week  Substance and Sexual Activity  . Alcohol use: Yes    Alcohol/week: 35.0 standard drinks    Types: 30 Cans of beer, 5 Shots of liquor per week  . Drug use: No  . Sexual activity: Not on file  Lifestyle  . Physical activity    Days per week: 7 days    Minutes per session: 30 min  . Stress: Not at all  Relationships  .  Social connections    Talks on phone: More than three times a week    Gets together: More than three times a week    Attends religious service: More than 4 times per year    Active member of club or organization: Not on file    Attends meetings of clubs or organizations: Not on file    Relationship status: Not on file  Other Topics Concern  . Not on file  Social History Narrative  . Not on file    ECOG Status: 0 - Asymptomatic  Review of Systems: A 12 point ROS discussed and pertinent positives are indicated in the HPI above.  All other systems are negative.  Review of Systems  Constitutional: Positive for fatigue.  HENT: Negative.   Respiratory: Negative.    Cardiovascular: Negative.   Gastrointestinal: Negative.   Genitourinary: Negative.   Musculoskeletal: Negative.   Neurological: Negative.     Vital Signs: BP 100/79   Pulse 94   Temp 98.1 F (36.7 C) (Oral)   Resp 18   Ht 5\' 5"  (1.651 m)   Wt 65.3 kg   BMI 23.96 kg/m   Physical Exam Vitals signs reviewed.  Constitutional:      General: He is not in acute distress.    Appearance: He is not ill-appearing, toxic-appearing or diaphoretic.  HENT:     Head: Normocephalic and atraumatic.     Mouth/Throat:     Mouth: Mucous membranes are moist.     Pharynx: Oropharynx is clear. No oropharyngeal exudate or posterior oropharyngeal erythema.  Cardiovascular:     Rate and Rhythm: Normal rate and regular rhythm.     Heart sounds: Normal heart sounds. No murmur. No gallop.   Pulmonary:     Effort: Pulmonary effort is normal. No respiratory distress.     Breath sounds: Normal breath sounds. No stridor. No wheezing, rhonchi or rales.  Abdominal:     General: Abdomen is flat. There is no distension.     Palpations: Abdomen is soft. There is no mass.     Tenderness: There is no abdominal tenderness. There is no guarding or rebound.  Musculoskeletal:        General: No swelling.  Skin:    General: Skin is warm and dry.  Neurological:     General: No focal deficit present.     Mental Status: He is alert and oriented to person, place, and time.     Imaging: US Biopsy (liver)  Result Date: 08/08/2018 INDICATION: Metastatic hepatocellular carcinoma in the chest EXAM: ULTRASOUND GUIDED CORE BIOPSY OF CENTRAL INDETERMINATE RIGHT LIVER MASS MEDICATIONS: 1% LIDOCAINE LOCAL ANESTHESIA/SEDATION: Versed 2.0mg  IV; Fentanyl 157mcg IV; Moderate Sedation Time:  10 MINUTES The patient was continuously monitored during the procedure by the interventional radiology nurse under my direct supervision. FLUOROSCOPY TIME:  Fluoroscopy Time: None. COMPLICATIONS: None immediate. PROCEDURE: The procedure,  risks, benefits, and alternatives were explained to the patient. Questions regarding the procedure were encouraged and answered. The patient understands and consents to the procedure. Previous imaging reviewed. Preliminary ultrasound performed. The central right hepatic mass was localized with ultrasound and marked for a right subcostal approach. Under sterile conditions and local anesthesia, a 17 gauge 11.8 cm coaxial guide was advanced to the lesion. Needle position confirmed with ultrasound. 18 gauge core biopsies obtained. Samples placed in formalin. Images obtained for documentation. Needle removed. Postprocedure imaging demonstrates no hemorrhage or hematoma. Patient tolerated biopsy well. FINDINGS: Imaging confirms needle placement to the central right  liver mass for core biopsy IMPRESSION: Successful ultrasound core biopsy of the central right liver mass. Electronically Signed   By: Jerilynn Mages.  Shick M.D.   On: 08/08/2018 12:19    Labs:  CBC: Recent Labs    06/23/18 1100 07/17/18 0725 08/08/18 1013 08/28/18 0747  WBC 5.6 6.3 5.7 6.7  HGB 14.6 15.3 14.6 14.4  HCT 43.2 44.4 43.1 42.2  PLT 297 319 356 259    COAGS: Recent Labs    07/17/18 0725 08/08/18 1013 08/28/18 0747  INR 1.0 1.0 1.0  APTT 36  --   --     BMP: Recent Labs    06/23/18 1100  NA 136  K 3.9  CL 99  CO2 23  GLUCOSE 99  BUN 6*  CALCIUM 9.6  CREATININE 0.81  GFRNONAA 93  GFRAA 107    LIVER FUNCTION TESTS: Recent Labs    06/23/18 1100  BILITOT 0.5  AST 63*  ALT 41  ALKPHOS 71  PROT 7.4  ALBUMIN 4.0     Assessment and Plan:  For CT guided biopsy of LUL lung mass today.  Risks and benefits of CT guided lung nodule biopsy was discussed with the patient including, but not limited to bleeding, hemoptysis, respiratory failure requiring intubation, infection, pneumothorax requiring chest tube placement, stroke from air embolism or even death. All of the patient's questions were answered and the patient  is agreeable to proceed. Consent signed and in chart.   Electronically Signed: Azzie Roup, MD 08/28/2018, 8:37 AM   I spent a total of 15 Minutes in face to face in clinical consultation, greater than 50% of which was counseling/coordinating care for CT guided lung biopsy.

## 2018-08-30 LAB — SURGICAL PATHOLOGY

## 2018-08-31 ENCOUNTER — Other Ambulatory Visit: Payer: Self-pay | Admitting: Oncology

## 2018-09-01 ENCOUNTER — Encounter: Payer: Self-pay | Admitting: Oncology

## 2018-09-01 ENCOUNTER — Inpatient Hospital Stay: Payer: Medicare Other

## 2018-09-01 ENCOUNTER — Other Ambulatory Visit: Payer: Self-pay

## 2018-09-01 ENCOUNTER — Inpatient Hospital Stay: Payer: Medicare Other | Attending: Oncology | Admitting: Oncology

## 2018-09-01 VITALS — BP 112/81 | HR 105 | Temp 97.3°F | Wt 139.9 lb

## 2018-09-01 DIAGNOSIS — C781 Secondary malignant neoplasm of mediastinum: Secondary | ICD-10-CM | POA: Diagnosis not present

## 2018-09-01 DIAGNOSIS — Z515 Encounter for palliative care: Secondary | ICD-10-CM | POA: Diagnosis not present

## 2018-09-01 DIAGNOSIS — Z87891 Personal history of nicotine dependence: Secondary | ICD-10-CM | POA: Diagnosis not present

## 2018-09-01 DIAGNOSIS — R5383 Other fatigue: Secondary | ICD-10-CM | POA: Diagnosis not present

## 2018-09-01 DIAGNOSIS — Z791 Long term (current) use of non-steroidal anti-inflammatories (NSAID): Secondary | ICD-10-CM | POA: Insufficient documentation

## 2018-09-01 DIAGNOSIS — R222 Localized swelling, mass and lump, trunk: Secondary | ICD-10-CM

## 2018-09-01 DIAGNOSIS — R918 Other nonspecific abnormal finding of lung field: Secondary | ICD-10-CM

## 2018-09-01 DIAGNOSIS — Z7189 Other specified counseling: Secondary | ICD-10-CM

## 2018-09-01 DIAGNOSIS — C3492 Malignant neoplasm of unspecified part of left bronchus or lung: Secondary | ICD-10-CM

## 2018-09-01 DIAGNOSIS — B182 Chronic viral hepatitis C: Secondary | ICD-10-CM | POA: Diagnosis not present

## 2018-09-01 DIAGNOSIS — F1010 Alcohol abuse, uncomplicated: Secondary | ICD-10-CM | POA: Diagnosis not present

## 2018-09-01 DIAGNOSIS — R0602 Shortness of breath: Secondary | ICD-10-CM | POA: Insufficient documentation

## 2018-09-01 DIAGNOSIS — C220 Liver cell carcinoma: Secondary | ICD-10-CM | POA: Insufficient documentation

## 2018-09-01 DIAGNOSIS — C3412 Malignant neoplasm of upper lobe, left bronchus or lung: Secondary | ICD-10-CM | POA: Insufficient documentation

## 2018-09-01 DIAGNOSIS — I10 Essential (primary) hypertension: Secondary | ICD-10-CM | POA: Insufficient documentation

## 2018-09-01 DIAGNOSIS — C7802 Secondary malignant neoplasm of left lung: Secondary | ICD-10-CM | POA: Diagnosis not present

## 2018-09-01 DIAGNOSIS — M25512 Pain in left shoulder: Secondary | ICD-10-CM | POA: Diagnosis not present

## 2018-09-01 DIAGNOSIS — Z5111 Encounter for antineoplastic chemotherapy: Secondary | ICD-10-CM

## 2018-09-01 DIAGNOSIS — Z79899 Other long term (current) drug therapy: Secondary | ICD-10-CM | POA: Diagnosis not present

## 2018-09-01 LAB — COMPREHENSIVE METABOLIC PANEL
ALT: 33 U/L (ref 0–44)
AST: 42 U/L — ABNORMAL HIGH (ref 15–41)
Albumin: 3.3 g/dL — ABNORMAL LOW (ref 3.5–5.0)
Alkaline Phosphatase: 72 U/L (ref 38–126)
Anion gap: 9 (ref 5–15)
BUN: 9 mg/dL (ref 8–23)
CO2: 25 mmol/L (ref 22–32)
Calcium: 9.3 mg/dL (ref 8.9–10.3)
Chloride: 103 mmol/L (ref 98–111)
Creatinine, Ser: 0.78 mg/dL (ref 0.61–1.24)
GFR calc Af Amer: >60 mL/min (ref >60)
GFR calc non Af Amer: >60 mL/min (ref >60)
Glucose, Bld: 103 mg/dL — ABNORMAL HIGH (ref 70–99)
Potassium: 4 mmol/L (ref 3.5–5.1)
Sodium: 137 mmol/L (ref 135–145)
Total Bilirubin: 0.5 mg/dL (ref 0.3–1.2)
Total Protein: 8 g/dL (ref 6.5–8.1)

## 2018-09-01 LAB — CBC WITH DIFFERENTIAL/PLATELET
Abs Immature Granulocytes: 0.05 10*3/uL (ref 0.00–0.07)
Basophils Absolute: 0 10*3/uL (ref 0.0–0.1)
Basophils Relative: 0 %
Eosinophils Absolute: 0.3 10*3/uL (ref 0.0–0.5)
Eosinophils Relative: 4 %
HCT: 39.7 % (ref 39.0–52.0)
Hemoglobin: 13.6 g/dL (ref 13.0–17.0)
Immature Granulocytes: 1 %
Lymphocytes Relative: 14 %
Lymphs Abs: 1 10*3/uL (ref 0.7–4.0)
MCH: 30.5 pg (ref 26.0–34.0)
MCHC: 34.3 g/dL (ref 30.0–36.0)
MCV: 89 fL (ref 80.0–100.0)
Monocytes Absolute: 1.1 10*3/uL — ABNORMAL HIGH (ref 0.1–1.0)
Monocytes Relative: 15 %
Neutro Abs: 4.9 10*3/uL (ref 1.7–7.7)
Neutrophils Relative %: 66 %
Platelets: 280 10*3/uL (ref 150–400)
RBC: 4.46 MIL/uL (ref 4.22–5.81)
RDW: 14.2 % (ref 11.5–15.5)
WBC: 7.3 10*3/uL (ref 4.0–10.5)
nRBC: 0 % (ref 0.0–0.2)

## 2018-09-02 DIAGNOSIS — C3492 Malignant neoplasm of unspecified part of left bronchus or lung: Secondary | ICD-10-CM | POA: Insufficient documentation

## 2018-09-02 NOTE — Progress Notes (Signed)
Hematology/Oncology follow up note [facility] [phone] [phone]   Patient Care Team: Paulene Floor as PCP - General (Physician Assistant) Telford Nab, RN as Registered Nurse Clent Jacks, RN as Registered Nurse  REFERRING PROVIDER: Trinna Post, PA-C  CHIEF COMPLAINTS/REASON FOR VISIT:  Discussion of metastatic cancer management.  HISTORY OF PRESENTING ILLNESS:   Samuel Hood. is a  68 y.o.  male with PMH listed below was seen in consultation at the request of  Terrilee Croak, Adriana M, PA-C  for evaluation of lung nodule in the liver lesion. Patient has a past medical history of hypertension, alcohol abuse, smoking emphysema.  He has had serial CTs done in the past.  CT images were independently reviewed by me. 07/26/2017 CT chest with contrast showed extensive pleural-parenchymal scarring within the left upper lobe with several areas of soft tissue nodularity measuring up to 1.6 cm.  In this patient who is at increased risk of lung cancer further investigation with PET scan is recommended. Small chronic appearance loculated hydropneumothorax overlies the left apex. Slowly enlarging lesion within the central portion of the right lobe of liver identified. Per note, patient supposed to have additional work-up done in December repeat CT scan which he did not  Patient was recently seen by primary care provider and has subsequent image done for follow-up. CT on 06/30/2018 showed multiple significantly enlarged paraspinal mass are noted concerning.  The largest measures 3.8 x 0.5 cm and there appears to be a lytic destruction of the adjacent T11 vertebral body.  Also noted is new solid density measuring 17 x 12 mm in the pleural parenchymal density in the left upper lobe noted on prior exam.  Concerning for malignancy.  4.4 ascending thoracic aortic aneurysm.  Recommend annual imaging.  Patient had PET scan done on 07/06/2018  Which showed there are 2 FDG avid lesion within the left upper lobe worrisome for primary lung neoplasm.  There is evidence of chest wall involvement.  Metastasis to the posterior mediastinum is identified with extension into T11 vertebra and possible involvement of the left T12 neural foramina.  Patient reports left shoulder pain.  Smoking half a pack a day not motivated with further questioning quitting Denies weight loss, hemoptysis, cough.  Chronic shortness of breath with exertion. He drinks alcohol daily. Lives with his girlfriend.  He has 5 adult kids  #Hepatitis C  # 6/1?2020  CT-guided biopsy of left-sided posterior mediastinal soft tissue adjacent to T10/11. Patient's case was discussed on tumor board. Consensus was reached that [facility] versus primary lung cancer with hepatoid adenocarcinoma features. Consensus was to proceed with liver biopsy first as liver mass was not FDG avid on PET scan.  Patient underwent liver biopsy and the present to discuss pathology reports.   INTERVAL HISTORY Samuel Hood. is a 68 y.o. male who has above history reviewed by me today presents for follow up visit discuss pathology results and management plan.   # 08/08/2018 Liver mass biopsy showed fragments of necrotic and calcified tissue with adjacent fibrous capsule Scant viable nonneoplastic liver tissue with steatosis, inflammation and non specific fibrosis.  # 08/28/2018 CT guided left upper lobe lung mass biopsy showed poorly differentiated adenocarcinoma of lung origin.   Patient reports having "soreness" at the biopsy sites, no exacerbating or alleviating factors.  Otherwise no new complaints. Chronic SOB at baseline.  6/19-08/16/2018  Finished palliative radiation to paraspinal soft tissue mass.   Review of Systems  Constitutional: Positive for fatigue. Negative for appetite change, chills, fever and unexpected weight change.  HENT:   Negative for hearing loss and voice change.   Eyes: Negative  for eye problems and icterus.  Respiratory: Positive for shortness of breath. Negative for chest tightness and cough.   Cardiovascular: Negative for chest pain and leg swelling.  Gastrointestinal: Negative for abdominal distention, abdominal pain and blood in stool.  Endocrine: Negative for hot flashes.  Genitourinary: Negative for difficulty urinating, dysuria and frequency.   Musculoskeletal: Negative for arthralgias.  Skin: Negative for itching and rash.  Neurological: Negative for extremity weakness, light-headedness and numbness.  Hematological: Negative for adenopathy. Does not bruise/bleed easily.  Psychiatric/Behavioral: Negative for confusion.    MEDICAL HISTORY:  Past Medical History:  Diagnosis Date  . Hepatocellular carcinoma metastatic to left lung ([place]) 07/24/2018  . History of angiography    left lower extremity  . Hypertension   . Small bowel obstruction ([place])     SURGICAL HISTORY: Past Surgical History:  Procedure Laterality Date  . COLON SURGERY    . COLONOSCOPY W/ POLYPECTOMY    . COLONOSCOPY WITH PROPOFOL N/A 01/31/2018   Procedure: COLONOSCOPY WITH PROPOFOL;  Surgeon: Toledo, Benay Pike, MD;  Location: [facility] ENDOSCOPY;  Service: Gastroenterology;  Laterality: N/A;  . debridement fasciotomy leg, left Left 03/19/2016  . LAPAROTOMY N/A 09/27/2014   Procedure: EXPLORATORY LAPAROTOMY;  Surgeon: Dia Crawford III, MD;  Location: [facility] ORS;  Service: General;  Laterality: N/A;    SOCIAL HISTORY: Social History   Socioeconomic History  . Marital status: Single    Spouse name: Not on file  . Number of children: Not on file  . Years of education: Not on file  . Highest education level: Not on file  Occupational History  . Occupation: retired  Scientific laboratory technician  . Financial resource strain: Hard  . Food insecurity    Worry: Never true    Inability: Never true  . Transportation needs    Medical: No    Non-medical: No  Tobacco Use  . Smoking status: Former Smoker     Packs/day: 0.25    Years: 50.00    Pack years: 12.50  . Smokeless tobacco: Never Used  . Tobacco comment: 1-2/week  Substance and Sexual Activity  . Alcohol use: Yes    Alcohol/week: 35.0 standard drinks    Types: 30 Cans of beer, 5 Shots of liquor per week  . Drug use: No  . Sexual activity: Not on file  Lifestyle  . Physical activity    Days per week: 7 days    Minutes per session: 30 min  . Stress: Not at all  Relationships  . Social connections    Talks on phone: More than three times a week    Gets together: More than three times a week    Attends religious service: More than 4 times per year    Active member of club or organization: Not on file    Attends meetings of clubs or organizations: Not on file    Relationship status: Not on file  . Intimate partner violence    Fear of current or ex partner: Patient refused    Emotionally abused: No    Physically abused: No    Forced sexual activity: No  Other Topics Concern  . Not on file  Social History Narrative  . Not on file    FAMILY HISTORY: Family History  Problem Relation Age of Onset  . Cancer Mother   .  Cancer Father     ALLERGIES:  has No Known Allergies.  MEDICATIONS:  Current Outpatient Medications  Medication Sig Dispense Refill  . amLODipine (NORVASC) 5 MG tablet Take 1 tablet (5 mg total) by mouth daily. 90 tablet 0  . ibuprofen (ADVIL,MOTRIN) 200 MG tablet Take 200 mg by mouth daily as needed.    . Lenvatinib 12 mg daily dose (LENVIMA) 3 x 4 MG capsule Take 12 mg by mouth daily. 90 capsule 0  . Multiple Vitamin (MULTIVITAMIN) capsule Take 1 capsule by mouth daily.    . naproxen (NAPROSYN) 500 MG tablet Take 500 mg by mouth 2 (two) times daily with a meal.     No current facility-administered medications for this visit.      PHYSICAL EXAMINATION: ECOG PERFORMANCE STATUS: 1 - Symptomatic but completely ambulatory Vitals:   09/01/18 1303  BP: 112/81  Pulse: (!) 105  Temp: (!) 97.3 F (36.3  C)   Filed Weights   09/01/18 1303  Weight: 139 lb 14.4 oz (63.5 kg)    Physical Exam Constitutional:      General: He is not in acute distress. HENT:     Head: Normocephalic and atraumatic.  Eyes:     General: No scleral icterus.    Pupils: Pupils are equal, round, and reactive to light.  Neck:     Musculoskeletal: Normal range of motion and neck supple.  Cardiovascular:     Rate and Rhythm: Normal rate and regular rhythm.     Heart sounds: Normal heart sounds.  Pulmonary:     Effort: Pulmonary effort is normal. No respiratory distress.     Breath sounds: No wheezing.     Comments: Decreased breath sound bilaterally. Abdominal:     General: Bowel sounds are normal. There is no distension.     Palpations: Abdomen is soft. There is no mass.     Tenderness: There is no abdominal tenderness.  Musculoskeletal: Normal range of motion.        General: No deformity.  Skin:    General: Skin is warm and dry.     Findings: No erythema or rash.  Neurological:     Mental Status: He is alert and oriented to person, place, and time.     Cranial Nerves: No cranial nerve deficit.     Coordination: Coordination normal.  Psychiatric:        Behavior: Behavior normal.        Thought Content: Thought content normal.     LABORATORY DATA:  I have reviewed the data as listed Lab Results  Component Value Date   WBC 7.3 09/01/2018   HGB 13.6 09/01/2018   HCT 39.7 09/01/2018   MCV 89.0 09/01/2018   PLT 280 09/01/2018   Recent Labs    06/23/18 1100 09/01/18 1353  NA 136 137  K 3.9 4.0  CL 99 103  CO2 23 25  GLUCOSE 99 103*  BUN 6* 9  CREATININE 0.81 0.78  CALCIUM 9.6 9.3  GFRNONAA 93 >60  GFRAA 107 >60  PROT 7.4 8.0  ALBUMIN 4.0 3.3*  AST 63* 42*  ALT 41 33  ALKPHOS 71 72  BILITOT 0.5 0.5   Iron/TIBC/Ferritin/ %Sat No results found for: IRON, TIBC, FERRITIN, IRONPCTSAT   RADIOGRAPHIC STUDIES: I have personally reviewed the radiological images as listed and agreed  with the findings in the report.  US Biopsy (liver)  Result Date: 08/08/2018 INDICATION: Metastatic hepatocellular carcinoma in the chest EXAM: ULTRASOUND GUIDED CORE BIOPSY OF  CENTRAL INDETERMINATE RIGHT LIVER MASS MEDICATIONS: 1% LIDOCAINE LOCAL ANESTHESIA/SEDATION: Versed 2.0mg  IV; Fentanyl 134mcg IV; Moderate Sedation Time:  10 MINUTES The patient was continuously monitored during the procedure by the interventional radiology nurse under my direct supervision. FLUOROSCOPY TIME:  Fluoroscopy Time: None. COMPLICATIONS: None immediate. PROCEDURE: The procedure, risks, benefits, and alternatives were explained to the patient. Questions regarding the procedure were encouraged and answered. The patient understands and consents to the procedure. Previous imaging reviewed. Preliminary ultrasound performed. The central right hepatic mass was localized with ultrasound and marked for a right subcostal approach. Under sterile conditions and local anesthesia, a 17 gauge 11.8 cm coaxial guide was advanced to the lesion. Needle position confirmed with ultrasound. 18 gauge core biopsies obtained. Samples placed in formalin. Images obtained for documentation. Needle removed. Postprocedure imaging demonstrates no hemorrhage or hematoma. Patient tolerated biopsy well. FINDINGS: Imaging confirms needle placement to the central right liver mass for core biopsy IMPRESSION: Successful ultrasound core biopsy of the central right liver mass. Electronically Signed   By: Jerilynn Mages.  Shick M.D.   On: 08/08/2018 12:19   Ct Biopsy  Result Date: 08/28/2018 CLINICAL DATA:  Status post biopsy left posterior mediastinal soft tissue mass adjacent to thoracic spine revealing hepatocellular carcinoma/hepatoid adenocarcinoma. This led to additional ultrasound-guided biopsy of a central hepatic mass that revealed necrosis and calcification without malignancy. Request has now been made to biopsy 1 of 2 adjacent anterior left upper lobe lung lesions  demonstrated by CT and PET scan previously. EXAM: CT GUIDED CORE BIOPSY OF LEFT UPPER LOBE LUNG MASS ANESTHESIA/SEDATION: 1.5 mg IV Versed; 75 mcg IV Fentanyl Total Moderate Sedation Time:  25 minutes. The patient's level of consciousness and physiologic status were continuously monitored during the procedure by Radiology nursing. PROCEDURE: The procedure risks, benefits, and alternatives were explained to the patient. Questions regarding the procedure were encouraged and answered. The patient understands and consents to the procedure. CT was performed through the chest in a supine position. The left anterior chest wall was prepped with chlorhexidine in a sterile fashion, and a sterile drape was applied covering the operative field. A sterile gown and sterile gloves were used for the procedure. Local anesthesia was provided with 1% Lidocaine. Under CT guidance, a 17 gauge trocar needle was advanced into the anterior left subpleural lung. After confirming needle tip position, 3 separate 18 gauge core biopsy samples were obtained through the outer needle. Material was submitted in formalin. Upon completion, the Biosentry device was utilized in depositing a plug at the pleural entry site as the outer needle was removed. COMPLICATIONS: None FINDINGS: Anterior subpleural component tumor in the left upper lobe was targeted immediately deep to the pleural surface. The additional deeper left upper lobe nodule is not excessive bola to percutaneous biopsy due to surrounding blebs. The region of soft tissue sampled measures roughly 1.6 x 2.9 cm. There were no immediate complications. IMPRESSION: CT-guided core biopsy performed of left upper lobe mass in the anterior subpleural lung. Electronically Signed   By: Aletta Edouard M.D.   On: 08/28/2018 13:36      ASSESSMENT & PLAN:  1. Primary lung adenocarcinoma, left ([place])   2. Hepatocellular carcinoma metastatic to left lung ([facility])   3. Paraspinal mass   4. Goals of  care, counseling/discussion   5. Chronic hepatitis C without hepatic coma ([place])   6. Encounter for antineoplastic chemotherapy    Per patient's request, I also called the patient's daughter Lot Medford via her cell phone [phone].  Daughter lives close to [place]. I had a lengthy discussion with patient and her daughter.   I updated them about the biopsy results of liver mass and lung mass.  Based on my discussion with pathologists, pathology from mediastinal mass and lung mass are not similar. Most likely patient has 2 primary cancers: Metastatic HCC and lung cancer.  # Paraspinal mass involving T11-12.  Patient is undergoing palliative radiation.Due to the proximity of mediastinal mass to spine, patient has already received palliative radiation to the mediastinal metastasis from [facility].  Staging of lung cancer can be difficult due to 2 primaries.  # [place] The diagnosis and care plan were discussed with patient in detail.  The goal of treatment which is to palliate disease, disease related symptoms, improve quality of life and hopefully prolong life was highlighted in our discussion.  Chemotherapy education was provided. Patient and daughter understand that his condition is not curable.   I recommend patient to start with [facility] treatments with lenvatinib 12mg  daily.-weight >60kg I explained to the patient and daughter the risks and benefits of Lenvatinib 12mg  daily including all but not limited to hypertension, diarrhea, nausea, vomiting, heart toxicities, arrhythmia, bleeding, liver toxicities, neuro toxicities, electrolyte imbalance and even death,  etc.  Patient  And daugher understanding and willing to proceed chemotherapy Repeat chemistry today shows normal LFT except mild AST elevation.   # Lung adenocarcinoma, cT3 Nx Mx, will discuss with RadOnc to see if possible for radiation.  Will also discuss patient's case on Tumor board.  # Hepatitis C, newly diagnosed need to follow-up with GI.  #Alcohol abuse, per daughter patient has stopped drinking alcohol.  Supportive care measures are necessary for patient well-being and will be provided as necessary. We spent sufficient time to discuss many aspect of care, questions were answered to patient's satisfaction.   #Goal of care was discussed with both patient and daughter today. #Refer to palliative care.   Orders Placed This Encounter  Procedures  . Comprehensive metabolic panel    Standing Status:   Future    Number of Occurrences:   1    Standing Expiration Date:   09/01/2019  . CBC with Differential    Standing Status:   Future    Number of Occurrences:   1    Standing Expiration Date:   09/01/2019  . CBC with Differential    Standing Status:   Future    Standing Expiration Date:   09/01/2019  . Comprehensive metabolic panel    Standing Status:   Future    Standing Expiration Date:   09/01/2019    All questions were answered. The patient knows to call the clinic with any problems questions or concerns.  cc Trinna Post, PA-C   Return of visit:  1 week.    Earlie Server, MD, PhD 09/02/2018

## 2018-09-07 ENCOUNTER — Other Ambulatory Visit: Payer: Medicare Other

## 2018-09-07 NOTE — Progress Notes (Addendum)
Tumor Board Documentation  Samuel Hood. was presented by Johney Frame at our Tumor Board on 09/07/2018, which included representatives from medical oncology, radiation oncology, pathology, radiology, surgical, surgical oncology, navigation, internal medicine, research.  Samuel Hood currently presents as a current patient, for Samuel Hood, for discussion, for new positive pathology with history of the following treatments: neoadjuvant radiation, active survellience.  Additionally, we reviewed previous medical and familial history, history of present illness, and recent lab results along with all available histopathologic and imaging studies. The tumor board considered available treatment options and made the following recommendations: Radiation of lung cancer, followed by systemic chemotherapy.     The following procedures/referrals were also placed: No orders of the defined types were placed in this encounter.   Clinical Trial Status: not discussed   Staging used: AJCC Stage Group  AJCC Staging: T: 3 N: 0 M: x Group: Stage ? [place] of Lung,    Stage 4 Hepatocellular Carcinoma   National site-specific guidelines NCCN were discussed with respect to the case.  Tumor board is a meeting of clinicians from various specialty areas who evaluate and discuss patients for whom a multidisciplinary approach is being considered. Final determinations in the plan of care are those of the provider(s). The responsibility for follow up of recommendations given during tumor board is that of the provider.   Today's extended care, comprehensive team conference, Samuel Hood was not present for the discussion and was not examined.   Multidisciplinary Tumor Board is a multidisciplinary case peer review process.  Decisions discussed in the Multidisciplinary Tumor Board reflect the opinions of the specialists present at the conference without having examined the patient.  Ultimately, treatment and diagnostic decisions rest with  the primary provider(s) and the patient.

## 2018-09-08 ENCOUNTER — Encounter: Payer: Self-pay | Admitting: Oncology

## 2018-09-08 ENCOUNTER — Inpatient Hospital Stay (HOSPITAL_BASED_OUTPATIENT_CLINIC_OR_DEPARTMENT_OTHER): Payer: Medicare Other | Admitting: Oncology

## 2018-09-08 ENCOUNTER — Inpatient Hospital Stay: Payer: Medicare Other

## 2018-09-08 ENCOUNTER — Other Ambulatory Visit: Payer: Self-pay

## 2018-09-08 ENCOUNTER — Inpatient Hospital Stay (HOSPITAL_BASED_OUTPATIENT_CLINIC_OR_DEPARTMENT_OTHER): Payer: Medicare Other | Admitting: Hospice and Palliative Medicine

## 2018-09-08 VITALS — BP 98/67 | HR 96 | Temp 97.6°F | Resp 16 | Wt 138.9 lb

## 2018-09-08 DIAGNOSIS — Z515 Encounter for palliative care: Secondary | ICD-10-CM

## 2018-09-08 DIAGNOSIS — C7802 Secondary malignant neoplasm of left lung: Secondary | ICD-10-CM | POA: Diagnosis not present

## 2018-09-08 DIAGNOSIS — C781 Secondary malignant neoplasm of mediastinum: Secondary | ICD-10-CM

## 2018-09-08 DIAGNOSIS — Z87891 Personal history of nicotine dependence: Secondary | ICD-10-CM

## 2018-09-08 DIAGNOSIS — R5383 Other fatigue: Secondary | ICD-10-CM

## 2018-09-08 DIAGNOSIS — Z791 Long term (current) use of non-steroidal anti-inflammatories (NSAID): Secondary | ICD-10-CM

## 2018-09-08 DIAGNOSIS — C220 Liver cell carcinoma: Secondary | ICD-10-CM

## 2018-09-08 DIAGNOSIS — M25512 Pain in left shoulder: Secondary | ICD-10-CM

## 2018-09-08 DIAGNOSIS — C3412 Malignant neoplasm of upper lobe, left bronchus or lung: Secondary | ICD-10-CM

## 2018-09-08 DIAGNOSIS — B182 Chronic viral hepatitis C: Secondary | ICD-10-CM

## 2018-09-08 DIAGNOSIS — C3492 Malignant neoplasm of unspecified part of left bronchus or lung: Secondary | ICD-10-CM

## 2018-09-08 DIAGNOSIS — F1010 Alcohol abuse, uncomplicated: Secondary | ICD-10-CM

## 2018-09-08 DIAGNOSIS — R0602 Shortness of breath: Secondary | ICD-10-CM | POA: Diagnosis not present

## 2018-09-08 DIAGNOSIS — Z79899 Other long term (current) drug therapy: Secondary | ICD-10-CM

## 2018-09-08 DIAGNOSIS — I10 Essential (primary) hypertension: Secondary | ICD-10-CM

## 2018-09-08 LAB — CBC WITH DIFFERENTIAL/PLATELET
Abs Immature Granulocytes: 0.03 10*3/uL (ref 0.00–0.07)
Basophils Absolute: 0 10*3/uL (ref 0.0–0.1)
Basophils Relative: 0 %
Eosinophils Absolute: 0.3 10*3/uL (ref 0.0–0.5)
Eosinophils Relative: 4 %
HCT: 42 % (ref 39.0–52.0)
Hemoglobin: 14.3 g/dL (ref 13.0–17.0)
Immature Granulocytes: 0 %
Lymphocytes Relative: 16 %
Lymphs Abs: 1.1 10*3/uL (ref 0.7–4.0)
MCH: 30.2 pg (ref 26.0–34.0)
MCHC: 34 g/dL (ref 30.0–36.0)
MCV: 88.8 fL (ref 80.0–100.0)
Monocytes Absolute: 0.7 10*3/uL (ref 0.1–1.0)
Monocytes Relative: 10 %
Neutro Abs: 4.8 10*3/uL (ref 1.7–7.7)
Neutrophils Relative %: 70 %
Platelets: 333 10*3/uL (ref 150–400)
RBC: 4.73 MIL/uL (ref 4.22–5.81)
RDW: 14.1 % (ref 11.5–15.5)
WBC: 6.8 10*3/uL (ref 4.0–10.5)
nRBC: 0 % (ref 0.0–0.2)

## 2018-09-08 LAB — COMPREHENSIVE METABOLIC PANEL
ALT: 33 U/L (ref 0–44)
AST: 42 U/L — ABNORMAL HIGH (ref 15–41)
Albumin: 3.5 g/dL (ref 3.5–5.0)
Alkaline Phosphatase: 79 U/L (ref 38–126)
Anion gap: 9 (ref 5–15)
BUN: 7 mg/dL — ABNORMAL LOW (ref 8–23)
CO2: 25 mmol/L (ref 22–32)
Calcium: 9.3 mg/dL (ref 8.9–10.3)
Chloride: 103 mmol/L (ref 98–111)
Creatinine, Ser: 0.8 mg/dL (ref 0.61–1.24)
GFR calc Af Amer: >60 mL/min (ref >60)
GFR calc non Af Amer: >60 mL/min (ref >60)
Glucose, Bld: 108 mg/dL — ABNORMAL HIGH (ref 70–99)
Potassium: 4.2 mmol/L (ref 3.5–5.1)
Sodium: 137 mmol/L (ref 135–145)
Total Bilirubin: 0.7 mg/dL (ref 0.3–1.2)
Total Protein: 8.1 g/dL (ref 6.5–8.1)

## 2018-09-08 MED ORDER — LOPERAMIDE HCL 2 MG PO CAPS: 2.0000 mg | ORAL_CAPSULE | ORAL | 1 refills | Status: DC

## 2018-09-08 NOTE — Progress Notes (Signed)
[place]  Telephone:([phone] [phone]   Name: Samuel Hood. Date: 09/08/2018 MRN: [id number]  DOB: [birth year]  Patient Care Team: Paulene Floor as PCP - General (Physician Assistant) Telford Nab, RN as Registered Nurse Clent Jacks, RN as Registered Nurse    REASON FOR CONSULTATION: Palliative Care consult requested for this 68 y.o. male with multiple medical problems including stage IV hepatocellular carcinoma and a second primary lung cancer.  PET scan shows hypermetabolic left upper lobe mass with chest wall involvement.  Also noted is metastasis to the posterior mediastinum with extension into T11 vertebra and possible involvement of the left T12 neural foramina.  PMH also notable for HCV, history of alcohol use, and active tobacco abuse.  Patient was referred to palliative care to help address goals.   SOCIAL HISTORY:     reports that he has quit smoking. He has a 12.50 pack-year smoking history. He has never used smokeless tobacco. He reports current alcohol use of about 35.0 standard drinks of alcohol per week. He reports that he does not use drugs.   Patient is a widower after having been married 27 years.  He lives at home with a girlfriend.  He has 3 daughters and a son.  His son is currently incarcerated and he reports having a strained relationship with a daughter due to substance abuse.  His daughter, [name], is most involved.  Patient previously worked in [employer] research (medical).  ADVANCE DIRECTIVES:  On file  CODE STATUS:   PAST MEDICAL HISTORY: Past Medical History:  Diagnosis Date  . Hepatocellular carcinoma metastatic to left lung ([place]) 07/24/2018  . History of angiography    left lower extremity  . Hypertension   . Small bowel obstruction ([place])     PAST SURGICAL HISTORY:  Past Surgical History:  Procedure Laterality Date  . COLON SURGERY    . COLONOSCOPY W/ POLYPECTOMY    . COLONOSCOPY WITH  PROPOFOL N/A 01/31/2018   Procedure: COLONOSCOPY WITH PROPOFOL;  Surgeon: Toledo, Benay Pike, MD;  Location: [facility] ENDOSCOPY;  Service: Gastroenterology;  Laterality: N/A;  . debridement fasciotomy leg, left Left 03/19/2016  . LAPAROTOMY N/A 09/27/2014   Procedure: EXPLORATORY LAPAROTOMY;  Surgeon: Dia Crawford III, MD;  Location: [facility] ORS;  Service: General;  Laterality: N/A;    HEMATOLOGY/ONCOLOGY HISTORY:  Oncology History   No history exists.    ALLERGIES:  has No Known Allergies.  MEDICATIONS:  Current Outpatient Medications  Medication Sig Dispense Refill  . amLODipine (NORVASC) 5 MG tablet Take 1 tablet (5 mg total) by mouth daily. 90 tablet 0  . ibuprofen (ADVIL,MOTRIN) 200 MG tablet Take 200 mg by mouth daily as needed.    . Lenvatinib 12 mg daily dose (LENVIMA) 3 x 4 MG capsule Take 12 mg by mouth daily. 90 capsule 0  . loperamide (IMODIUM) 2 MG capsule Take 1 capsule (2 mg total) by mouth See admin instructions. With onset of loose stool, take 90m followed by 264mevery 2 hours until 12 hours have passed without loose bowel movement. Maximum: 16 mg/day 60 capsule 1  . Multiple Vitamin (MULTIVITAMIN) capsule Take 1 capsule by mouth daily.    . naproxen (NAPROSYN) 500 MG tablet Take 500 mg by mouth 2 (two) times daily with a meal.     No current facility-administered medications for this visit.     VITAL SIGNS: There were no vitals taken for this visit. There were no vitals filed for  this visit.  Estimated body mass index is 23.11 kg/m as calculated from the following:   Height as of 09-21-18: 5' 5" (1.651 m).   Weight as of an earlier encounter on 09/08/18: 138 lb 14.4 oz (63 kg).  LABS: CBC:    Component Value Date/Time   WBC 6.8 09/08/2018 0825   HGB 14.3 09/08/2018 0825   HGB 14.6 06/23/2018 1100   HCT 42.0 09/08/2018 0825   HCT 43.2 06/23/2018 1100   PLT 333 09/08/2018 0825   PLT 297 06/23/2018 1100   MCV 88.8 09/08/2018 0825   MCV 96 06/23/2018 1100   MCV 88  09/14/2013 0511   NEUTROABS 4.8 09/08/2018 0825   NEUTROABS 2.8 06/23/2018 1100   NEUTROABS 12.4 (H) 09/14/2013 0511   LYMPHSABS 1.1 09/08/2018 0825   LYMPHSABS 1.8 06/23/2018 1100   LYMPHSABS 2.3 09/14/2013 0511   MONOABS 0.7 09/08/2018 0825   MONOABS 1.1 (H) 09/14/2013 0511   EOSABS 0.3 09/08/2018 0825   EOSABS 0.1 06/23/2018 1100   EOSABS 0.2 09/14/2013 0511   BASOSABS 0.0 09/08/2018 0825   BASOSABS 0.0 06/23/2018 1100   BASOSABS 0.0 09/14/2013 0511   Comprehensive Metabolic Panel:    Component Value Date/Time   NA 137 09/08/2018 0825   NA 136 06/23/2018 1100   NA 139 09/13/2013 0408   K 4.2 09/08/2018 0825   K 3.3 (L) 09/13/2013 0408   CL 103 09/08/2018 0825   CL 105 09/13/2013 0408   CO2 25 09/08/2018 0825   CO2 27 09/13/2013 0408   BUN 7 (L) 09/08/2018 0825   BUN 6 (L) 06/23/2018 1100   BUN 4 (L) 09/13/2013 0408   CREATININE 0.80 09/08/2018 0825   CREATININE 0.67 09/13/2013 0408   GLUCOSE 108 (H) 09/08/2018 0825   GLUCOSE 108 (H) 09/13/2013 0408   CALCIUM 9.3 09/08/2018 0825   CALCIUM 8.1 (L) 09/13/2013 0408   AST 42 (H) 09/08/2018 0825   AST 30 09/13/2013 0408   ALT 33 09/08/2018 0825   ALT 27 09/13/2013 0408   ALKPHOS 79 09/08/2018 0825   ALKPHOS 218 (H) 09/13/2013 0408   BILITOT 0.7 09/08/2018 0825   BILITOT 0.5 06/23/2018 1100   BILITOT 0.9 09/13/2013 0408   PROT 8.1 09/08/2018 0825   PROT 7.4 06/23/2018 1100   PROT 6.2 (L) 09/13/2013 0408   ALBUMIN 3.5 09/08/2018 0825   ALBUMIN 4.0 06/23/2018 1100   ALBUMIN 1.9 (L) 09/13/2013 0408    RADIOGRAPHIC STUDIES: Ct Biopsy  Result Date: 09-21-18 CLINICAL DATA:  Status post biopsy left posterior mediastinal soft tissue mass adjacent to thoracic spine revealing hepatocellular carcinoma/hepatoid adenocarcinoma. This led to additional ultrasound-guided biopsy of a central hepatic mass that revealed necrosis and calcification without malignancy. Request has now been made to biopsy 1 of 2 adjacent anterior left  upper lobe lung lesions demonstrated by CT and PET scan previously. EXAM: CT GUIDED CORE BIOPSY OF LEFT UPPER LOBE LUNG MASS ANESTHESIA/SEDATION: 1.5 mg IV Versed; 75 mcg IV Fentanyl Total Moderate Sedation Time:  25 minutes. The patient's level of consciousness and physiologic status were continuously monitored during the procedure by Radiology nursing. PROCEDURE: The procedure risks, benefits, and alternatives were explained to the patient. Questions regarding the procedure were encouraged and answered. The patient understands and consents to the procedure. CT was performed through the chest in a supine position. The left anterior chest wall was prepped with chlorhexidine in a sterile fashion, and a sterile drape was applied covering the operative field. A sterile gown and  sterile gloves were used for the procedure. Local anesthesia was provided with 1% Lidocaine. Under CT guidance, a 17 gauge trocar needle was advanced into the anterior left subpleural lung. After confirming needle tip position, 3 separate 18 gauge core biopsy samples were obtained through the outer needle. Material was submitted in formalin. Upon completion, the Biosentry device was utilized in depositing a plug at the pleural entry site as the outer needle was removed. COMPLICATIONS: None FINDINGS: Anterior subpleural component tumor in the left upper lobe was targeted immediately deep to the pleural surface. The additional deeper left upper lobe nodule is not excessive bola to percutaneous biopsy due to surrounding blebs. The region of soft tissue sampled measures roughly 1.6 x 2.9 cm. There were no immediate complications. IMPRESSION: CT-guided core biopsy performed of left upper lobe mass in the anterior subpleural lung. Electronically Signed   By: Aletta Edouard M.D.   On: 08/28/2018 13:36    PERFORMANCE STATUS (ECOG) : 1 - Symptomatic but completely ambulatory  Review of Systems Unless otherwise noted, a complete review of systems  is negative.  Physical Exam General: NAD, frail appearing, thin Pulmonary: Unlabored Extremities: no edema Skin: no rashes Neurological: Weakness but otherwise nonfocal  IMPRESSION: I met with patient today in the clinic to establish palliative care.  Introduced palliative care services and attempt establish therapeutic rapport.  Patient has started treatment for [place] with Lenvima.  He is being referred for XRT of lung mass.  Symptomatically, patient reports doing reasonably well.  He denies distressing symptoms such as pain.  He reports a good performance status and is able to care for himself at home.  Patient seems to recognize the severity of his cancer.  He says "I know I am going to die."  Patient proceeded to tell me that he has planned his funeral and is obtaining a wheel to ensure that his estate is managed according to his wishes.  He asked about staying at home for end-of-life care.  Patient says that his mother was cared for in the hospice home and he would also be okay with that.  We did discuss the future option of hospice but for now he remains committed to ongoing treatment.  Patient has completed a living will and has named his daughter, [name], to be his healthcare power of attorney.  Today, we reviewed a MOST Form, which he took home to discuss with his daughter.  With patient's permission, I called and spoke with his daughter.  She tells me that she will review the MOST Form with patient.  Patient has previously told her that he would not want aggressive measures at end-of-life.  PLAN: -Continue current scope of treatment -ACP documents previously completed -Reviewed a MOST form today -RTC in 2 weeks   Patient expressed understanding and was in agreement with this plan. He also understands that He can call the clinic at any time with any questions, concerns, or complaints.     Time Total: 30 minutes  Visit consisted of counseling and education dealing with the  complex and emotionally intense issues of symptom management and palliative care in the setting of serious and potentially life-threatening illness.Greater than 50%  of this time was spent counseling and coordinating care related to the above assessment and plan.  Signed by: Altha Harm, PhD, NP-C [phone] (Work Cell)

## 2018-09-08 NOTE — Progress Notes (Signed)
Hematology/Oncology follow up note [facility] [phone] [phone]   Patient Care Team: Paulene Floor as PCP - General (Physician Assistant) Telford Nab, RN as Registered Nurse Clent Jacks, RN as Registered Nurse  REFERRING PROVIDER: Trinna Post, PA-C  CHIEF COMPLAINTS/REASON FOR VISIT:  Discussion of metastatic cancer management.  HISTORY OF PRESENTING ILLNESS:   Samuel Hood. is a  68 y.o.  male with PMH listed below was seen in consultation at the request of  Terrilee Croak, Adriana M, PA-C  for evaluation of lung nodule in the liver lesion. Patient has a past medical history of hypertension, alcohol abuse, smoking emphysema.  He has had serial CTs done in the past.  CT images were independently reviewed by me. 07/26/2017 CT chest with contrast showed extensive pleural-parenchymal scarring within the left upper lobe with several areas of soft tissue nodularity measuring up to 1.6 cm.  In this patient who is at increased risk of lung cancer further investigation with PET scan is recommended. Small chronic appearance loculated hydropneumothorax overlies the left apex. Slowly enlarging lesion within the central portion of the right lobe of liver identified. Per note, patient supposed to have additional work-up done in December repeat CT scan which he did not  Patient was recently seen by primary care provider and has subsequent image done for follow-up. CT on 06/30/2018 showed multiple significantly enlarged paraspinal mass are noted concerning.  The largest measures 3.8 x 0.5 cm and there appears to be a lytic destruction of the adjacent T11 vertebral body.  Also noted is new solid density measuring 17 x 12 mm in the pleural parenchymal density in the left upper lobe noted on prior exam.  Concerning for malignancy.  4.4 ascending thoracic aortic aneurysm.  Recommend annual imaging.  Patient had PET scan done on 07/06/2018  Which showed there are 2 FDG avid lesion within the left upper lobe worrisome for primary lung neoplasm.  There is evidence of chest wall involvement.  Metastasis to the posterior mediastinum is identified with extension into T11 vertebra and possible involvement of the left T12 neural foramina.  Patient reports left shoulder pain.  Smoking half a pack a day not motivated with further questioning quitting Denies weight loss, hemoptysis, cough.  Chronic shortness of breath with exertion. He drinks alcohol daily. Lives with his girlfriend.  He has 5 adult kids  #Hepatitis C  # 6/1?2020  CT-guided biopsy of left-sided posterior mediastinal soft tissue adjacent to T10/11. Patient's case was discussed on tumor board. Consensus was reached that [facility] versus primary lung cancer with hepatoid adenocarcinoma features. Consensus was to proceed with liver biopsy first as liver mass was not FDG avid on PET scan.  Patient underwent liver biopsy and the present to discuss pathology reports.  ## 08/08/2018 Liver mass biopsy showed fragments of necrotic and calcified tissue with adjacent fibrous capsule Scant viable nonneoplastic liver tissue with steatosis, inflammation and non specific fibrosis.  # 08/28/2018 CT guided left upper lobe lung mass biopsy showed poorly differentiated adenocarcinoma of lung origin.  6/19-08/16/2018  Finished palliative radiation to paraspinal soft tissue mass.  INTERVAL HISTORY Samuel Hood. is a 68 y.o. male who has above history reviewed by me today presents for follow up visit for assessment of tolerability of chemotherapy. Patient was advised to start Lenvatinib 12 mg at last visit. Patient informed me that he did not start until yesterday.  He took 12 mg.  Reports feeling fine. No diarrhea.  No new complaints. Denies any pain today.   Review of Systems  Constitutional: Positive for fatigue. Negative for appetite change, chills, fever and unexpected weight change.  HENT:    Negative for hearing loss and voice change.   Eyes: Negative for eye problems and icterus.  Respiratory: Positive for shortness of breath. Negative for chest tightness and cough.   Cardiovascular: Negative for chest pain and leg swelling.  Gastrointestinal: Negative for abdominal distention, abdominal pain and blood in stool.  Endocrine: Negative for hot flashes.  Genitourinary: Negative for difficulty urinating, dysuria and frequency.   Musculoskeletal: Negative for arthralgias.  Skin: Negative for itching and rash.  Neurological: Negative for extremity weakness, light-headedness and numbness.  Hematological: Negative for adenopathy. Does not bruise/bleed easily.  Psychiatric/Behavioral: Negative for confusion.    MEDICAL HISTORY:  Past Medical History:  Diagnosis Date  . Hepatocellular carcinoma metastatic to left lung ([place]) 07/24/2018  . History of angiography    left lower extremity  . Hypertension   . Small bowel obstruction ([place])     SURGICAL HISTORY: Past Surgical History:  Procedure Laterality Date  . COLON SURGERY    . COLONOSCOPY W/ POLYPECTOMY    . COLONOSCOPY WITH PROPOFOL N/A 01/31/2018   Procedure: COLONOSCOPY WITH PROPOFOL;  Surgeon: Toledo, Benay Pike, MD;  Location: [facility] ENDOSCOPY;  Service: Gastroenterology;  Laterality: N/A;  . debridement fasciotomy leg, left Left 03/19/2016  . LAPAROTOMY N/A 09/27/2014   Procedure: EXPLORATORY LAPAROTOMY;  Surgeon: Dia Crawford III, MD;  Location: [facility] ORS;  Service: General;  Laterality: N/A;    SOCIAL HISTORY: Social History   Socioeconomic History  . Marital status: Single    Spouse name: Not on file  . Number of children: Not on file  . Years of education: Not on file  . Highest education level: Not on file  Occupational History  . Occupation: retired  Scientific laboratory technician  . Financial resource strain: Hard  . Food insecurity    Worry: Never true    Inability: Never true  . Transportation needs    Medical: No     Non-medical: No  Tobacco Use  . Smoking status: Former Smoker    Packs/day: 0.25    Years: 50.00    Pack years: 12.50  . Smokeless tobacco: Never Used  . Tobacco comment: 1-2/week  Substance and Sexual Activity  . Alcohol use: Yes    Alcohol/week: 35.0 standard drinks    Types: 30 Cans of beer, 5 Shots of liquor per week  . Drug use: No  . Sexual activity: Not on file  Lifestyle  . Physical activity    Days per week: 7 days    Minutes per session: 30 min  . Stress: Not at all  Relationships  . Social connections    Talks on phone: More than three times a week    Gets together: More than three times a week    Attends religious service: More than 4 times per year    Active member of club or organization: Not on file    Attends meetings of clubs or organizations: Not on file    Relationship status: Not on file  . Intimate partner violence    Fear of current or ex partner: Patient refused    Emotionally abused: No    Physically abused: No    Forced sexual activity: No  Other Topics Concern  . Not on file  Social History Narrative  . Not on file    FAMILY HISTORY: Family  History  Problem Relation Age of Onset  . Cancer Mother   . Cancer Father     ALLERGIES:  has No Known Allergies.  MEDICATIONS:  Current Outpatient Medications  Medication Sig Dispense Refill  . amLODipine (NORVASC) 5 MG tablet Take 1 tablet (5 mg total) by mouth daily. 90 tablet 0  . ibuprofen (ADVIL,MOTRIN) 200 MG tablet Take 200 mg by mouth daily as needed.    . Multiple Vitamin (MULTIVITAMIN) capsule Take 1 capsule by mouth daily.    . naproxen (NAPROSYN) 500 MG tablet Take 500 mg by mouth 2 (two) times daily with a meal.    . Lenvatinib 12 mg daily dose (LENVIMA) 3 x 4 MG capsule Take 12 mg by mouth daily. 90 capsule 0  . loperamide (IMODIUM) 2 MG capsule Take 1 capsule (2 mg total) by mouth See admin instructions. With onset of loose stool, take 4mg  followed by 2mg  every 2 hours until 12 hours  have passed without loose bowel movement. Maximum: 16 mg/day 60 capsule 1   No current facility-administered medications for this visit.      PHYSICAL EXAMINATION: ECOG PERFORMANCE STATUS: 1 - Symptomatic but completely ambulatory Vitals:   09/08/18 0844  BP: 98/67  Pulse: 96  Resp: 16  Temp: 97.6 F (36.4 C)  SpO2: 99%   Filed Weights   09/08/18 0844  Weight: 138 lb 14.4 oz (63 kg)    Physical Exam Constitutional:      General: He is not in acute distress.    Comments: cachectic  HENT:     Head: Normocephalic and atraumatic.  Eyes:     General: No scleral icterus.    Pupils: Pupils are equal, round, and reactive to light.  Neck:     Musculoskeletal: Normal range of motion and neck supple.  Cardiovascular:     Rate and Rhythm: Normal rate and regular rhythm.     Heart sounds: Normal heart sounds.  Pulmonary:     Effort: Pulmonary effort is normal. No respiratory distress.     Breath sounds: No wheezing.     Comments: Decreased breath sound bilaterally. Abdominal:     General: Bowel sounds are normal. There is no distension.     Palpations: Abdomen is soft. There is no mass.     Tenderness: There is no abdominal tenderness.  Musculoskeletal: Normal range of motion.        General: No deformity.  Skin:    General: Skin is warm and dry.     Findings: No erythema or rash.  Neurological:     Mental Status: He is alert and oriented to person, place, and time.     Cranial Nerves: No cranial nerve deficit.     Coordination: Coordination normal.  Psychiatric:        Behavior: Behavior normal.        Thought Content: Thought content normal.     LABORATORY DATA:  I have reviewed the data as listed Lab Results  Component Value Date   WBC 6.8 09/08/2018   HGB 14.3 09/08/2018   HCT 42.0 09/08/2018   MCV 88.8 09/08/2018   PLT 333 09/08/2018   Recent Labs    06/23/18 1100 09/01/18 1353 09/08/18 0825  NA 136 137 137  K 3.9 4.0 4.2  CL 99 103 103  CO2 23 25  25   GLUCOSE 99 103* 108*  BUN 6* 9 7*  CREATININE 0.81 0.78 0.80  CALCIUM 9.6 9.3 9.3  GFRNONAA 93 >60 >60  GFRAA 107 >60 >60  PROT 7.4 8.0 8.1  ALBUMIN 4.0 3.3* 3.5  AST 63* 42* 42*  ALT 41 33 33  ALKPHOS 71 72 79  BILITOT 0.5 0.5 0.7   Iron/TIBC/Ferritin/ %Sat No results found for: IRON, TIBC, FERRITIN, IRONPCTSAT   RADIOGRAPHIC STUDIES: I have personally reviewed the radiological images as listed and agreed with the findings in the report.  Ct Biopsy  Result Date: 08/28/2018 CLINICAL DATA:  Status post biopsy left posterior mediastinal soft tissue mass adjacent to thoracic spine revealing hepatocellular carcinoma/hepatoid adenocarcinoma. This led to additional ultrasound-guided biopsy of a central hepatic mass that revealed necrosis and calcification without malignancy. Request has now been made to biopsy 1 of 2 adjacent anterior left upper lobe lung lesions demonstrated by CT and PET scan previously. EXAM: CT GUIDED CORE BIOPSY OF LEFT UPPER LOBE LUNG MASS ANESTHESIA/SEDATION: 1.5 mg IV Versed; 75 mcg IV Fentanyl Total Moderate Sedation Time:  25 minutes. The patient's level of consciousness and physiologic status were continuously monitored during the procedure by Radiology nursing. PROCEDURE: The procedure risks, benefits, and alternatives were explained to the patient. Questions regarding the procedure were encouraged and answered. The patient understands and consents to the procedure. CT was performed through the chest in a supine position. The left anterior chest wall was prepped with chlorhexidine in a sterile fashion, and a sterile drape was applied covering the operative field. A sterile gown and sterile gloves were used for the procedure. Local anesthesia was provided with 1% Lidocaine. Under CT guidance, a 17 gauge trocar needle was advanced into the anterior left subpleural lung. After confirming needle tip position, 3 separate 18 gauge core biopsy samples were obtained through  the outer needle. Material was submitted in formalin. Upon completion, the Biosentry device was utilized in depositing a plug at the pleural entry site as the outer needle was removed. COMPLICATIONS: None FINDINGS: Anterior subpleural component tumor in the left upper lobe was targeted immediately deep to the pleural surface. The additional deeper left upper lobe nodule is not excessive bola to percutaneous biopsy due to surrounding blebs. The region of soft tissue sampled measures roughly 1.6 x 2.9 cm. There were no immediate complications. IMPRESSION: CT-guided core biopsy performed of left upper lobe mass in the anterior subpleural lung. Electronically Signed   By: Aletta Edouard M.D.   On: 08/28/2018 13:36      ASSESSMENT & PLAN:  1. Primary lung adenocarcinoma, left ([place])   2. Hepatocellular carcinoma metastatic to left lung [facility])    # patient started Levantinib yesterday.  Discussed with patient about possible side effects.  Instructions of Imodium PRN for diarrhea was discussed in details.  Rx was provided to patient.    # Lung adenocarcinoma, cT3 Nx Mx, refer to Radonc for SBRT.  # Goal of care was discussed. Patient understands that his condition is not curable.  He has appointment with palliative care today as well  Labs are reviewed and discussed with patient.  Supportive care measures are necessary for patient well-being and will be provided as necessary. We spent sufficient time to discuss many aspect of care, questions were answered to patient's satisfaction.   Orders Placed This Encounter  Procedures  . CBC with Differential/Platelet    Standing Status:   Future    Standing Expiration Date:   09/08/2019  . Comprehensive metabolic panel    Standing Status:   Future    Standing Expiration Date:   09/08/2019    All questions were answered.  The patient knows to call the clinic with any problems questions or concerns. Return of visit:  2 weeks.    Earlie Server, MD, PhD  09/08/2018

## 2018-09-08 NOTE — Progress Notes (Signed)
Pt here for follow up. Denies any concerns.  

## 2018-09-11 DIAGNOSIS — C3412 Malignant neoplasm of upper lobe, left bronchus or lung: Secondary | ICD-10-CM | POA: Diagnosis not present

## 2018-09-11 DIAGNOSIS — C771 Secondary and unspecified malignant neoplasm of intrathoracic lymph nodes: Secondary | ICD-10-CM | POA: Diagnosis not present

## 2018-09-11 DIAGNOSIS — C3492 Malignant neoplasm of unspecified part of left bronchus or lung: Secondary | ICD-10-CM | POA: Diagnosis not present

## 2018-09-11 DIAGNOSIS — C7802 Secondary malignant neoplasm of left lung: Secondary | ICD-10-CM | POA: Diagnosis not present

## 2018-09-11 DIAGNOSIS — C220 Liver cell carcinoma: Secondary | ICD-10-CM | POA: Diagnosis not present

## 2018-09-14 ENCOUNTER — Other Ambulatory Visit: Payer: Self-pay

## 2018-09-14 ENCOUNTER — Ambulatory Visit
Admission: RE | Admit: 2018-09-14 | Discharge: 2018-09-14 | Disposition: A | Discharge status: Home / Self Care | Payer: Medicare Other | Source: Ambulatory Visit | Attending: Radiation Oncology | Admitting: Radiation Oncology

## 2018-09-14 ENCOUNTER — Encounter: Payer: Self-pay | Admitting: Radiation Oncology

## 2018-09-14 VITALS — BP 123/92 | HR 91 | Temp 96.9°F | Resp 18 | Wt 137.6 lb

## 2018-09-14 DIAGNOSIS — C7802 Secondary malignant neoplasm of left lung: Secondary | ICD-10-CM

## 2018-09-14 DIAGNOSIS — Z87891 Personal history of nicotine dependence: Secondary | ICD-10-CM | POA: Diagnosis not present

## 2018-09-14 DIAGNOSIS — C7951 Secondary malignant neoplasm of bone: Secondary | ICD-10-CM | POA: Diagnosis not present

## 2018-09-14 DIAGNOSIS — C220 Liver cell carcinoma: Secondary | ICD-10-CM | POA: Diagnosis not present

## 2018-09-14 DIAGNOSIS — R222 Localized swelling, mass and lump, trunk: Secondary | ICD-10-CM

## 2018-09-14 DIAGNOSIS — C3412 Malignant neoplasm of upper lobe, left bronchus or lung: Secondary | ICD-10-CM | POA: Diagnosis not present

## 2018-09-14 NOTE — Progress Notes (Signed)
Radiation Oncology Follow up Note old patient new area lung cancer  Name: Samuel Hood.   Date:   09/14/2018 MRN:  [id number] DOB: [birth year]    This 68 y.o. male presents to the clinic today for lung cancer patient previously treated for paraspinal metastasis from known hepatocellular carcinoma.  REFERRING PROVIDER: Paulene Floor  HPI: Patient is a 68 year old male previously treated back in June for metastatic disease to his spine presenting as a paraspinal mass from known hepatocellular carcinoma.  He received excellent palliation from his radiation therapy..  We have been following a multifocal lesion in the left upper lobe with soft tissue nodularity measuring up to 1.6 cm.  He recently underwent a CT-guided biopsy of the left upper lobe mass which was positive for poorly differentiated adenocarcinoma of lung origin.  He specifically denies cough hemoptysis or chest tightness.  Had some tenderness in his chest after the CT biopsy.  Lesion is a T3 lung cancer which does invade the chest wall.  Patient is currently on Levantinib and tolerating that well.  COMPLICATIONS OF TREATMENT: none  FOLLOW UP COMPLIANCE: keeps appointments   PHYSICAL EXAM:  BP (!) 123/92 (BP Location: Left Arm, Patient Position: Sitting)   Pulse 91   Temp (!) 96.9 F (36.1 C) (Tympanic)   Resp 18   Wt 137 lb 9.6 oz (62.4 kg)   BMI 22.90 kg/m  Well-developed well-nourished patient in NAD. HEENT reveals PERLA, EOMI, discs not visualized.  Oral cavity is clear. No oral mucosal lesions are identified. Neck is clear without evidence of cervical or supraclavicular adenopathy. Lungs are clear to A&P. Cardiac examination is essentially unremarkable with regular rate and rhythm without murmur rub or thrill. Abdomen is benign with no organomegaly or masses noted. Motor sensory and DTR levels are equal and symmetric in the upper and lower extremities. Cranial nerves II through XII are grossly intact. Proprioception  is intact. No peripheral adenopathy or edema is identified. No motor or sensory levels are noted. Crude visual fields are within normal range.  RADIOLOGY RESULTS: CT scan of the chest is reviewed and compatible with above-stated findings have also reviewed his prior PET CT scan.  PLAN: This time like to go ahead with a hypofractionated course of radiation therapy to his left upper lobe.  Would plan on treating to 6000 cGy in 10 fractions.  I would use IMRT treatment planning and delivery based on the hypofractionated nature of the disease and to spare critical structures such as his heart spinal cord and normal lung volume.  Risks and benefits of treatment including production of cough fatigue skin reaction slight chance of dysphasia all were discussed in detail with the patient.  I personally set up and ordered CT simulation.  We will use motion restriction protocol for CT planning as well as PET CT fusion study.  Patient comprehends my treatment plan well.  I have personally set up and ordered CT simulation for next week.  I would like to take this opportunity to thank you for allowing me to participate in the care of your patient.Noreene Filbert, MD

## 2018-09-20 ENCOUNTER — Ambulatory Visit
Admission: RE | Admit: 2018-09-20 | Discharge: 2018-09-20 | Disposition: A | Discharge status: Home / Self Care | Payer: Medicare Other | Source: Ambulatory Visit | Attending: Radiation Oncology | Admitting: Radiation Oncology

## 2018-09-20 ENCOUNTER — Other Ambulatory Visit: Payer: Self-pay

## 2018-09-20 DIAGNOSIS — C220 Liver cell carcinoma: Secondary | ICD-10-CM | POA: Insufficient documentation

## 2018-09-20 DIAGNOSIS — Z510 Encounter for antineoplastic radiation therapy: Secondary | ICD-10-CM | POA: Insufficient documentation

## 2018-09-20 DIAGNOSIS — C3412 Malignant neoplasm of upper lobe, left bronchus or lung: Secondary | ICD-10-CM | POA: Diagnosis not present

## 2018-09-20 DIAGNOSIS — Z87891 Personal history of nicotine dependence: Secondary | ICD-10-CM | POA: Diagnosis not present

## 2018-09-20 DIAGNOSIS — C7951 Secondary malignant neoplasm of bone: Secondary | ICD-10-CM | POA: Insufficient documentation

## 2018-09-20 DIAGNOSIS — C7802 Secondary malignant neoplasm of left lung: Secondary | ICD-10-CM | POA: Insufficient documentation

## 2018-09-21 ENCOUNTER — Ambulatory Visit: Payer: Medicare Other | Admitting: Radiation Oncology

## 2018-09-21 ENCOUNTER — Inpatient Hospital Stay: Payer: Medicare Other | Admitting: Oncology

## 2018-09-21 ENCOUNTER — Inpatient Hospital Stay: Payer: Medicare Other

## 2018-09-21 ENCOUNTER — Inpatient Hospital Stay: Payer: Medicare Other | Admitting: Nurse Practitioner

## 2018-09-21 DIAGNOSIS — C220 Liver cell carcinoma: Secondary | ICD-10-CM | POA: Diagnosis not present

## 2018-09-21 DIAGNOSIS — C7802 Secondary malignant neoplasm of left lung: Secondary | ICD-10-CM | POA: Diagnosis not present

## 2018-09-21 DIAGNOSIS — Z510 Encounter for antineoplastic radiation therapy: Secondary | ICD-10-CM | POA: Diagnosis not present

## 2018-09-21 DIAGNOSIS — Z87891 Personal history of nicotine dependence: Secondary | ICD-10-CM | POA: Diagnosis not present

## 2018-09-21 DIAGNOSIS — C3412 Malignant neoplasm of upper lobe, left bronchus or lung: Secondary | ICD-10-CM | POA: Diagnosis not present

## 2018-09-21 DIAGNOSIS — C7951 Secondary malignant neoplasm of bone: Secondary | ICD-10-CM | POA: Diagnosis not present

## 2018-09-22 ENCOUNTER — Inpatient Hospital Stay: Payer: Medicare Other

## 2018-09-22 ENCOUNTER — Inpatient Hospital Stay (HOSPITAL_BASED_OUTPATIENT_CLINIC_OR_DEPARTMENT_OTHER): Payer: Medicare Other | Admitting: Oncology

## 2018-09-22 ENCOUNTER — Inpatient Hospital Stay (HOSPITAL_BASED_OUTPATIENT_CLINIC_OR_DEPARTMENT_OTHER): Payer: Medicare Other | Admitting: Nurse Practitioner

## 2018-09-22 ENCOUNTER — Inpatient Hospital Stay: Payer: Medicare Other | Admitting: Oncology

## 2018-09-22 ENCOUNTER — Other Ambulatory Visit: Payer: Self-pay

## 2018-09-22 ENCOUNTER — Inpatient Hospital Stay: Payer: Medicare Other | Attending: Oncology

## 2018-09-22 ENCOUNTER — Inpatient Hospital Stay: Payer: Medicare Other | Admitting: Hospice and Palliative Medicine

## 2018-09-22 ENCOUNTER — Encounter: Payer: Self-pay | Admitting: Oncology

## 2018-09-22 VITALS — BP 136/83 | HR 96 | Temp 98.1°F | Wt 135.5 lb

## 2018-09-22 DIAGNOSIS — R634 Abnormal weight loss: Secondary | ICD-10-CM | POA: Diagnosis not present

## 2018-09-22 DIAGNOSIS — K5900 Constipation, unspecified: Secondary | ICD-10-CM | POA: Diagnosis not present

## 2018-09-22 DIAGNOSIS — J984 Other disorders of lung: Secondary | ICD-10-CM | POA: Diagnosis not present

## 2018-09-22 DIAGNOSIS — R0602 Shortness of breath: Secondary | ICD-10-CM | POA: Insufficient documentation

## 2018-09-22 DIAGNOSIS — I951 Orthostatic hypotension: Secondary | ICD-10-CM | POA: Insufficient documentation

## 2018-09-22 DIAGNOSIS — B1920 Unspecified viral hepatitis C without hepatic coma: Secondary | ICD-10-CM | POA: Insufficient documentation

## 2018-09-22 DIAGNOSIS — Z87891 Personal history of nicotine dependence: Secondary | ICD-10-CM | POA: Diagnosis not present

## 2018-09-22 DIAGNOSIS — C7802 Secondary malignant neoplasm of left lung: Secondary | ICD-10-CM | POA: Diagnosis not present

## 2018-09-22 DIAGNOSIS — Z5111 Encounter for antineoplastic chemotherapy: Secondary | ICD-10-CM | POA: Diagnosis not present

## 2018-09-22 DIAGNOSIS — E860 Dehydration: Secondary | ICD-10-CM | POA: Insufficient documentation

## 2018-09-22 DIAGNOSIS — I10 Essential (primary) hypertension: Secondary | ICD-10-CM | POA: Insufficient documentation

## 2018-09-22 DIAGNOSIS — C3492 Malignant neoplasm of unspecified part of left bronchus or lung: Secondary | ICD-10-CM | POA: Diagnosis not present

## 2018-09-22 DIAGNOSIS — C220 Liver cell carcinoma: Secondary | ICD-10-CM | POA: Diagnosis not present

## 2018-09-22 DIAGNOSIS — J948 Other specified pleural conditions: Secondary | ICD-10-CM | POA: Insufficient documentation

## 2018-09-22 DIAGNOSIS — Z7289 Other problems related to lifestyle: Secondary | ICD-10-CM | POA: Insufficient documentation

## 2018-09-22 DIAGNOSIS — M25512 Pain in left shoulder: Secondary | ICD-10-CM | POA: Insufficient documentation

## 2018-09-22 DIAGNOSIS — R5383 Other fatigue: Secondary | ICD-10-CM | POA: Insufficient documentation

## 2018-09-22 DIAGNOSIS — R42 Dizziness and giddiness: Secondary | ICD-10-CM | POA: Diagnosis not present

## 2018-09-22 DIAGNOSIS — E871 Hypo-osmolality and hyponatremia: Secondary | ICD-10-CM | POA: Diagnosis not present

## 2018-09-22 DIAGNOSIS — R630 Anorexia: Secondary | ICD-10-CM | POA: Insufficient documentation

## 2018-09-22 DIAGNOSIS — F1010 Alcohol abuse, uncomplicated: Secondary | ICD-10-CM | POA: Diagnosis not present

## 2018-09-22 DIAGNOSIS — Z515 Encounter for palliative care: Secondary | ICD-10-CM

## 2018-09-22 DIAGNOSIS — Z809 Family history of malignant neoplasm, unspecified: Secondary | ICD-10-CM | POA: Diagnosis not present

## 2018-09-22 DIAGNOSIS — I712 Thoracic aortic aneurysm, without rupture: Secondary | ICD-10-CM | POA: Diagnosis not present

## 2018-09-22 DIAGNOSIS — Z79899 Other long term (current) drug therapy: Secondary | ICD-10-CM | POA: Diagnosis not present

## 2018-09-22 DIAGNOSIS — I890 Lymphedema, not elsewhere classified: Secondary | ICD-10-CM | POA: Insufficient documentation

## 2018-09-22 LAB — CBC WITH DIFFERENTIAL/PLATELET
Abs Immature Granulocytes: 0.02 10*3/uL (ref 0.00–0.07)
Basophils Absolute: 0 10*3/uL (ref 0.0–0.1)
Basophils Relative: 1 %
Eosinophils Absolute: 0.1 10*3/uL (ref 0.0–0.5)
Eosinophils Relative: 2 %
HCT: 44.4 % (ref 39.0–52.0)
Hemoglobin: 15.2 g/dL (ref 13.0–17.0)
Immature Granulocytes: 1 %
Lymphocytes Relative: 25 %
Lymphs Abs: 1 10*3/uL (ref 0.7–4.0)
MCH: 29.6 pg (ref 26.0–34.0)
MCHC: 34.2 g/dL (ref 30.0–36.0)
MCV: 86.4 fL (ref 80.0–100.0)
Monocytes Absolute: 0.5 10*3/uL (ref 0.1–1.0)
Monocytes Relative: 12 %
Neutro Abs: 2.5 10*3/uL (ref 1.7–7.7)
Neutrophils Relative %: 59 %
Platelets: 310 10*3/uL (ref 150–400)
RBC: 5.14 MIL/uL (ref 4.22–5.81)
RDW: 13.7 % (ref 11.5–15.5)
WBC: 4.2 10*3/uL (ref 4.0–10.5)
nRBC: 0 % (ref 0.0–0.2)

## 2018-09-22 LAB — COMPREHENSIVE METABOLIC PANEL
ALT: 31 U/L (ref 0–44)
AST: 37 U/L (ref 15–41)
Albumin: 3.3 g/dL — ABNORMAL LOW (ref 3.5–5.0)
Alkaline Phosphatase: 72 U/L (ref 38–126)
Anion gap: 9 (ref 5–15)
BUN: 8 mg/dL (ref 8–23)
CO2: 27 mmol/L (ref 22–32)
Calcium: 9.1 mg/dL (ref 8.9–10.3)
Chloride: 97 mmol/L — ABNORMAL LOW (ref 98–111)
Creatinine, Ser: 0.87 mg/dL (ref 0.61–1.24)
GFR calc Af Amer: >60 mL/min (ref >60)
GFR calc non Af Amer: >60 mL/min (ref >60)
Glucose, Bld: 125 mg/dL — ABNORMAL HIGH (ref 70–99)
Potassium: 3.7 mmol/L (ref 3.5–5.1)
Sodium: 133 mmol/L — ABNORMAL LOW (ref 135–145)
Total Bilirubin: 1 mg/dL (ref 0.3–1.2)
Total Protein: 8.5 g/dL — ABNORMAL HIGH (ref 6.5–8.1)

## 2018-09-22 NOTE — Progress Notes (Signed)
[facility]  Telephone:([phone] [phone]   Name: Samuel Hood. Date: 09/22/2018 MRN: [id number]  DOB: [birth year]  Patient Care Team: Paulene Floor as PCP - General (Physician Assistant) Telford Nab, RN as Registered Nurse Clent Jacks, RN as Registered Nurse    REASON FOR CONSULTATION: Palliative Care consult requested for this 68 y.o. male with multiple medical problems including stage IV hepatocellular carcinoma and a second primary lung cancer.  PET scan shows hypermetabolic left upper lobe mass with chest wall involvement.  Also noted is metastasis to the posterior mediastinum with extension into T11 vertebra and possible involvement of the left T12 neural foramina.  PMH also notable for HCV, history of alcohol use, and active tobacco abuse.  Patient was referred to palliative care to help address goals.   SOCIAL HISTORY:     reports that he has quit smoking. He has a 12.50 pack-year smoking history. He has never used smokeless tobacco. He reports current alcohol use of about 35.0 standard drinks of alcohol per week. He reports that he does not use drugs.  Patient is a widower after having been married 27 years.  He lives at home with a girlfriend.  He has 3 daughters and a son.  His son is currently incarcerated and he reports having a strained relationship with a daughter due to substance abuse.  His daughter, Samuel Hood, is most involved.  Patient previously worked in [employer] (medical).  ADVANCE DIRECTIVES: MOST completed today  CODE STATUS: DNR   PAST MEDICAL HISTORY: Past Medical History:  Diagnosis Date  . Hepatocellular carcinoma metastatic to left lung ([place]) 07/24/2018  . History of angiography    left lower extremity  . Hypertension   . Small bowel obstruction ([place])     PAST SURGICAL HISTORY:  Past Surgical History:  Procedure Laterality Date  . COLON SURGERY    . COLONOSCOPY W/ POLYPECTOMY    .  COLONOSCOPY WITH PROPOFOL N/A 01/31/2018   Procedure: COLONOSCOPY WITH PROPOFOL;  Surgeon: Toledo, Benay Pike, MD;  Location: [facility] ENDOSCOPY;  Service: Gastroenterology;  Laterality: N/A;  . debridement fasciotomy leg, left Left 03/19/2016  . LAPAROTOMY N/A 09/27/2014   Procedure: EXPLORATORY LAPAROTOMY;  Surgeon: Dia Crawford III, MD;  Location: [facility] ORS;  Service: General;  Laterality: N/A;    HEMATOLOGY/ONCOLOGY HISTORY:  Oncology History   No history exists.    ALLERGIES:  has No Known Allergies.  MEDICATIONS:  Current Outpatient Medications  Medication Sig Dispense Refill  . amLODipine (NORVASC) 5 MG tablet Take 1 tablet (5 mg total) by mouth daily. 90 tablet 0  . ibuprofen (ADVIL,MOTRIN) 200 MG tablet Take 200 mg by mouth daily as needed.    . Lenvatinib 12 mg daily dose (LENVIMA) 3 x 4 MG capsule Take 12 mg by mouth daily. 90 capsule 0  . loperamide (IMODIUM) 2 MG capsule Take 1 capsule (2 mg total) by mouth See admin instructions. With onset of loose stool, take 23m followed by 222mevery 2 hours until 12 hours have passed without loose bowel movement. Maximum: 16 mg/day 60 capsule 1  . Multiple Vitamin (MULTIVITAMIN) capsule Take 1 capsule by mouth daily.    . naproxen (NAPROSYN) 500 MG tablet Take 500 mg by mouth 2 (two) times daily with a meal.     No current facility-administered medications for this visit.     VITAL SIGNS: There were no vitals taken for this visit. There were no vitals filed for  this visit.  Estimated body mass index is 22.63 kg/m as calculated from the following:   Height as of 10/05/18: _0  (1.651 m).   Weight as of 10/05/18: 136 lb (61.7 kg).  LABS: CBC:    Component Value Date/Time   WBC 6.8 09/08/2018 0825   HGB 14.3 09/08/2018 0825   HGB 14.6 06/23/2018 1100   HCT 42.0 09/08/2018 0825   HCT 43.2 06/23/2018 1100   PLT 333 09/08/2018 0825   PLT 297 06/23/2018 1100   MCV 88.8 09/08/2018 0825   MCV 96 06/23/2018 1100   MCV 88 09/14/2013 0511    NEUTROABS 4.8 09/08/2018 0825   NEUTROABS 2.8 06/23/2018 1100   NEUTROABS 12.4 (H) 09/14/2013 0511   LYMPHSABS 1.1 09/08/2018 0825   LYMPHSABS 1.8 06/23/2018 1100   LYMPHSABS 2.3 09/14/2013 0511   MONOABS 0.7 09/08/2018 0825   MONOABS 1.1 (H) 09/14/2013 0511   EOSABS 0.3 09/08/2018 0825   EOSABS 0.1 06/23/2018 1100   EOSABS 0.2 09/14/2013 0511   BASOSABS 0.0 09/08/2018 0825   BASOSABS 0.0 06/23/2018 1100   BASOSABS 0.0 09/14/2013 0511   Comprehensive Metabolic Panel:    Component Value Date/Time   NA 137 09/08/2018 0825   NA 136 06/23/2018 1100   NA 139 09/13/2013 0408   K 4.2 09/08/2018 0825   K 3.3 (L) 09/13/2013 0408   CL 103 09/08/2018 0825   CL 105 09/13/2013 0408   CO2 25 09/08/2018 0825   CO2 27 09/13/2013 0408   BUN 7 (L) 09/08/2018 0825   BUN 6 (L) 06/23/2018 1100   BUN 4 (L) 09/13/2013 0408   CREATININE 0.80 09/08/2018 0825   CREATININE 0.67 09/13/2013 0408   GLUCOSE 108 (H) 09/08/2018 0825   GLUCOSE 108 (H) 09/13/2013 0408   CALCIUM 9.3 09/08/2018 0825   CALCIUM 8.1 (L) 09/13/2013 0408   Hood 42 (H) 09/08/2018 0825   Hood 30 09/13/2013 0408   ALT 33 09/08/2018 0825   ALT 27 09/13/2013 0408   ALKPHOS 79 09/08/2018 0825   ALKPHOS 218 (H) 09/13/2013 0408   BILITOT 0.7 09/08/2018 0825   BILITOT 0.5 06/23/2018 1100   BILITOT 0.9 09/13/2013 0408   PROT 8.1 09/08/2018 0825   PROT 7.4 06/23/2018 1100   PROT 6.2 (L) 09/13/2013 0408   ALBUMIN 3.5 09/08/2018 0825   ALBUMIN 4.0 06/23/2018 1100   ALBUMIN 1.9 (L) 09/13/2013 0408    RADIOGRAPHIC STUDIES: Ct Biopsy  Result Date: 09-18-2018 CLINICAL DATA:  Status post biopsy left posterior mediastinal soft tissue mass adjacent to thoracic spine revealing hepatocellular carcinoma/hepatoid adenocarcinoma. This led to additional ultrasound-guided biopsy of a central hepatic mass that revealed necrosis and calcification without malignancy. Request has now been made to biopsy 1 of 2 adjacent anterior left upper lobe lung  lesions demonstrated by CT and PET scan previously. EXAM: CT GUIDED CORE BIOPSY OF LEFT UPPER LOBE LUNG MASS ANESTHESIA/SEDATION: 1.5 mg IV Versed; 75 mcg IV Fentanyl Total Moderate Sedation Time:  25 minutes. The patient's level of consciousness and physiologic status were continuously monitored during the procedure by Radiology nursing. PROCEDURE: The procedure risks, benefits, and alternatives were explained to the patient. Questions regarding the procedure were encouraged and answered. The patient understands and consents to the procedure. CT was performed through the chest in a supine position. The left anterior chest wall was prepped with chlorhexidine in a sterile fashion, and a sterile drape was applied covering the operative field. A sterile gown and sterile gloves were used for the  procedure. Local anesthesia was provided with 1% Lidocaine. Under CT guidance, a 17 gauge trocar needle was advanced into the anterior left subpleural lung. After confirming needle tip position, 3 separate 18 gauge core biopsy samples were obtained through the outer needle. Material was submitted in formalin. Upon completion, the Biosentry device was utilized in depositing a plug at the pleural entry site as the outer needle was removed. COMPLICATIONS: None FINDINGS: Anterior subpleural component tumor in the left upper lobe was targeted immediately deep to the pleural surface. The additional deeper left upper lobe nodule is not excessive bola to percutaneous biopsy due to surrounding blebs. The region of soft tissue sampled measures roughly 1.6 x 2.9 cm. There were no immediate complications. IMPRESSION: CT-guided core biopsy performed of left upper lobe mass in the anterior subpleural lung. Electronically Signed   By: Aletta Edouard M.D.   On: 08/28/2018 13:36    PERFORMANCE STATUS (ECOG) : 1 - Symptomatic but completely ambulatory  Review of Systems  Constitutional: Positive for appetite change, fatigue and unexpected  weight change (loss). Negative for activity change.  HENT: Negative for mouth sores and trouble swallowing.   Eyes: Negative for pain and visual disturbance.  Respiratory: Negative for cough and shortness of breath.   Cardiovascular: Negative for chest pain and leg swelling.  Gastrointestinal: Negative for abdominal distention, abdominal pain, constipation, diarrhea and nausea.  Endocrine: Negative for cold intolerance and polyuria.  Genitourinary: Negative for decreased urine volume and difficulty urinating.  Musculoskeletal: Negative for arthralgias, gait problem and myalgias.  Skin: Negative for rash and wound.  Neurological: Negative for dizziness, weakness and light-headedness.  Psychiatric/Behavioral: Negative for sleep disturbance. The patient is not nervous/anxious.    Physical exam:  General: NAD, frail appearing, thin Pulmonary: Unlabored Extremities: no edema Skin: no rashes Neurological: Weakness but otherwise nonfocal  IMPRESSION: I met with patient today introduced myself.  He saw Dr. Tasia Catchings today as well.  He has started treatment for [facility] with lymphedema and met with Dr. Donella Stade for consideration of XRT to lung mass and anticipate starting treatment next week.  Overall he is tolerating lenvatinib well without significant side effects.  He is independent of his ADLs but says he does feel somewhat fatigued.  He says that he has stopped drinking alcohol.  He continues to try to cut back on his smoking.  His appetite is reduced and he has lost 3 pounds today.  Blood pressure and heart rate are stable.  He has some abdominal soreness but no significant pain.  He denies specific concerns today.  He continues to report a good performance status and is independent of his ADLs.  He says that he recognizes the severity of his cancer.  We had an extensive discussion today including both cancer treatment plans and plans for care if disease advances without being controlled by cancer  directed treatment.  We completed MOST and DNR today.  Scanned copies below and available under media tab.  Vynca not completed due to COVID-19 precautions.  Patient provided with originals for his records.       PLAN: -Continue current scope of treatment per Dr. Tasia Catchings -ACP documents completed today -Completed MOST form and DNR today -RTC in 2 weeks for follow up or sooner for symptom management if needed.   Patient expressed understanding and was in agreement with this plan. He also understands that He can call the clinic at any time with any questions, concerns, or complaints.   Time Total: 45 minutes  Visit  consisted of counseling and education dealing with the complex and emotionally intense issues of symptom management and palliative care in the setting of serious and potentially life-threatening illness.Greater than 50%  of this time was spent counseling and coordinating care related to the above assessment and plan.  Signed by: Beckey Rutter, DNP, AGNP-C [facility] [facility] [phone] (work cell) [phone] (office)

## 2018-09-23 NOTE — Progress Notes (Signed)
Hematology/Oncology follow up note [facility] [phone] [phone]) [phone]   Patient Care Team: Paulene Floor as PCP - General (Physician Assistant) Telford Nab, RN as Registered Nurse Clent Jacks, RN as Registered Nurse  REFERRING PROVIDER: Trinna Post, PA-C  CHIEF COMPLAINTS/REASON FOR VISIT:  Discussion of metastatic cancer management.  HISTORY OF PRESENTING ILLNESS:   Samuel Hood. is a  68 y.o.  male with PMH listed below was seen in consultation at the request of  Terrilee Croak, Adriana M, PA-C  for evaluation of lung nodule in the liver lesion. Patient has a past medical history of hypertension, alcohol abuse, smoking emphysema.  He has had serial CTs done in the past.  CT images were independently reviewed by me. 07/26/2017 CT chest with contrast showed extensive pleural-parenchymal scarring within the left upper lobe with several areas of soft tissue nodularity measuring up to 1.6 cm.  In this patient who is at increased risk of lung cancer further investigation with PET scan is recommended. Small chronic appearance loculated hydropneumothorax overlies the left apex. Slowly enlarging lesion within the central portion of the right lobe of liver identified. Per note, patient supposed to have additional work-up done in December repeat CT scan which he did not  Patient was recently seen by primary care provider and has subsequent image done for follow-up. CT on 06/30/2018 showed multiple significantly enlarged paraspinal mass are noted concerning.  The largest measures 3.8 x 0.5 cm and there appears to be a lytic destruction of the adjacent T11 vertebral body.  Also noted is new solid density measuring 17 x 12 mm in the pleural parenchymal density in the left upper lobe noted on prior exam.  Concerning for malignancy.  4.4 ascending thoracic aortic aneurysm.  Recommend annual imaging.  Patient had PET scan done on 07/06/2018  Which showed there are 2 FDG avid lesion within the left upper lobe worrisome for primary lung neoplasm.  There is evidence of chest wall involvement.  Metastasis to the posterior mediastinum is identified with extension into T11 vertebra and possible involvement of the left T12 neural foramina.  Patient reports left shoulder pain.  Smoking half a pack a day not motivated with further questioning quitting Denies weight loss, hemoptysis, cough.  Chronic shortness of breath with exertion. He drinks alcohol daily. Lives with his girlfriend.  He has 5 adult kids  #Hepatitis C  # 6/1?2020  CT-guided biopsy of left-sided posterior mediastinal soft tissue adjacent to T10/11. Patient's case was discussed on tumor board. Consensus was reached that [facility] versus primary lung cancer with hepatoid adenocarcinoma features. Consensus was to proceed with liver biopsy first as liver mass was not FDG avid on PET scan.  Patient underwent liver biopsy and the present to discuss pathology reports.  ## 08/08/2018 Liver mass biopsy showed fragments of necrotic and calcified tissue with adjacent fibrous capsule Scant viable nonneoplastic liver tissue with steatosis, inflammation and non specific fibrosis.  # 08/28/2018 CT guided left upper lobe lung mass biopsy showed poorly differentiated adenocarcinoma of lung origin.  6/19-08/16/2018  Finished palliative radiation to paraspinal soft tissue mass.  INTERVAL HISTORY [name]. is a 68 y.o. male who has above history reviewed by me today presents for follow up visit for assessment of tolerability of chemotherapy. 09/07/2018  Lenvatinib 12 mg Reports tolerating well, denies any nausea, vomiting, diarrhea. He is getting simulation for radiation of lung mass. Reports some abdomen soreness Denies any difficulty of having bowel  movement.  Bowel movement every other day. Appetite is fair. Denies any pain today. Chronic shortness of breath at baseline.  No cough.   Review of Systems  Constitutional: Positive for fatigue. Negative for appetite change, chills, fever and unexpected weight change.  HENT:   Negative for hearing loss and voice change.   Eyes: Negative for eye problems and icterus.  Respiratory: Positive for shortness of breath. Negative for chest tightness and cough.   Cardiovascular: Negative for chest pain and leg swelling.  Gastrointestinal: Negative for abdominal distention, abdominal pain and blood in stool.       Abdominal soreness  Endocrine: Negative for hot flashes.  Genitourinary: Negative for difficulty urinating, dysuria and frequency.   Musculoskeletal: Negative for arthralgias.  Skin: Negative for itching and rash.  Neurological: Negative for extremity weakness, light-headedness and numbness.  Hematological: Negative for adenopathy. Does not bruise/bleed easily.  Psychiatric/Behavioral: Negative for confusion.    MEDICAL HISTORY:  Past Medical History:  Diagnosis Date  . Hepatocellular carcinoma metastatic to left lung ([place]) 07/24/2018  . History of angiography    left lower extremity  . Hypertension   . Small bowel obstruction ([place])     SURGICAL HISTORY: Past Surgical History:  Procedure Laterality Date  . COLON SURGERY    . COLONOSCOPY W/ POLYPECTOMY    . COLONOSCOPY WITH PROPOFOL N/A 01/31/2018   Procedure: COLONOSCOPY WITH PROPOFOL;  Surgeon: Toledo, Benay Pike, MD;  Location: [facility];  Service: Gastroenterology;  Laterality: N/A;  . debridement fasciotomy leg, left Left 03/19/2016  . LAPAROTOMY N/A 09/27/2014   Procedure: EXPLORATORY LAPAROTOMY;  Surgeon: Dia Crawford III, MD;  Location: [facility] ORS;  Service: General;  Laterality: N/A;    SOCIAL HISTORY: Social History   Socioeconomic History  . Marital status: Single    Spouse name: Not on file  . Number of children: Not on file  . Years of education: Not on file  . Highest education level: Not on file  Occupational History  . Occupation: retired   Scientific laboratory technician  . Financial resource strain: Hard  . Food insecurity    Worry: Never true    Inability: Never true  . Transportation needs    Medical: No    Non-medical: No  Tobacco Use  . Smoking status: Former Smoker    Packs/day: 0.25    Years: 50.00    Pack years: 12.50  . Smokeless tobacco: Never Used  . Tobacco comment: 1-2/week  Substance and Sexual Activity  . Alcohol use: Yes    Alcohol/week: 35.0 standard drinks    Types: 30 Cans of beer, 5 Shots of liquor per week  . Drug use: No  . Sexual activity: Not on file  Lifestyle  . Physical activity    Days per week: 7 days    Minutes per session: 30 min  . Stress: Not at all  Relationships  . Social connections    Talks on phone: More than three times a week    Gets together: More than three times a week    Attends religious service: More than 4 times per year    Active member of club or organization: Not on file    Attends meetings of clubs or organizations: Not on file    Relationship status: Not on file  . Intimate partner violence    Fear of current or ex partner: Patient refused    Emotionally abused: No    Physically abused: No    Forced sexual activity: No  Other Topics Concern  . Not on file  Social History Narrative  . Not on file    FAMILY HISTORY: Family History  Problem Relation Age of Onset  . Cancer Mother   . Cancer Father     ALLERGIES:  has No Known Allergies.  MEDICATIONS:  Current Outpatient Medications  Medication Sig Dispense Refill  . amLODipine (NORVASC) 5 MG tablet Take 1 tablet (5 mg total) by mouth daily. 90 tablet 0  . ibuprofen (ADVIL,MOTRIN) 200 MG tablet Take 200 mg by mouth daily as needed.    . Lenvatinib 12 mg daily dose (LENVIMA) 3 x 4 MG capsule Take 12 mg by mouth daily. 90 capsule 0  . Multiple Vitamin (MULTIVITAMIN) capsule Take 1 capsule by mouth daily.    . naproxen (NAPROSYN) 500 MG tablet Take 500 mg by mouth 2 (two) times daily with a meal.    . loperamide  (IMODIUM) 2 MG capsule Take 1 capsule (2 mg total) by mouth See admin instructions. With onset of loose stool, take 4mg  followed by 2mg  every 2 hours until 12 hours have passed without loose bowel movement. Maximum: 16 mg/day (Patient not taking: Reported on 09/22/2018) 60 capsule 1   No current facility-administered medications for this visit.      PHYSICAL EXAMINATION: ECOG PERFORMANCE STATUS: 1 - Symptomatic but completely ambulatory Vitals:   09/22/18 1050  BP: 136/83  Pulse: 96  Temp: 98.1 F (36.7 C)   Filed Weights   09/22/18 1050  Weight: 135 lb 8 oz (61.5 kg)    Physical Exam Constitutional:      General: He is not in acute distress.    Comments: cachectic  HENT:     Head: Normocephalic and atraumatic.  Eyes:     General: No scleral icterus.    Pupils: Pupils are equal, round, and reactive to light.  Neck:     Musculoskeletal: Normal range of motion and neck supple.  Cardiovascular:     Rate and Rhythm: Normal rate and regular rhythm.     Heart sounds: Normal heart sounds.  Pulmonary:     Effort: Pulmonary effort is normal. No respiratory distress.     Breath sounds: No wheezing.     Comments: Decreased breath sound bilaterally. Abdominal:     General: Bowel sounds are normal. There is no distension.     Palpations: Abdomen is soft. There is no mass.     Tenderness: There is no abdominal tenderness.  Musculoskeletal: Normal range of motion.        General: No deformity.  Skin:    General: Skin is warm and dry.     Findings: No erythema or rash.  Neurological:     Mental Status: He is alert and oriented to person, place, and time.     Cranial Nerves: No cranial nerve deficit.     Coordination: Coordination normal.  Psychiatric:        Behavior: Behavior normal.        Thought Content: Thought content normal.     LABORATORY DATA:  I have reviewed the data as listed Lab Results  Component Value Date   WBC 4.2 09/22/2018   HGB 15.2 09/22/2018   HCT  44.4 09/22/2018   MCV 86.4 09/22/2018   PLT 310 09/22/2018   Recent Labs    09/01/18 1353 09/08/18 0825 09/22/18 0941  NA 137 137 133*  K 4.0 4.2 3.7  CL 103 103 97*  CO2 25 25 27   GLUCOSE 103*  108* 125*  BUN 9 7* 8  CREATININE 0.78 0.80 0.87  CALCIUM 9.3 9.3 9.1  GFRNONAA >60 >60 >60  GFRAA >60 >60 >60  PROT 8.0 8.1 8.5*  ALBUMIN 3.3* 3.5 3.3*  AST 42* 42* 37  ALT 33 33 31  ALKPHOS 72 79 72  BILITOT 0.5 0.7 1.0   Iron/TIBC/Ferritin/ %Sat No results found for: IRON, TIBC, FERRITIN, IRONPCTSAT   RADIOGRAPHIC STUDIES: I have personally reviewed the radiological images as listed and agreed with the findings in the report.  Ct Biopsy  Result Date: 08/28/2018 CLINICAL DATA:  Status post biopsy left posterior mediastinal soft tissue mass adjacent to thoracic spine revealing hepatocellular carcinoma/hepatoid adenocarcinoma. This led to additional ultrasound-guided biopsy of a central hepatic mass that revealed necrosis and calcification without malignancy. Request has now been made to biopsy 1 of 2 adjacent anterior left upper lobe lung lesions demonstrated by CT and PET scan previously. EXAM: CT GUIDED CORE BIOPSY OF LEFT UPPER LOBE LUNG MASS ANESTHESIA/SEDATION: 1.5 mg IV Versed; 75 mcg IV Fentanyl Total Moderate Sedation Time:  25 minutes. The patient's level of consciousness and physiologic status were continuously monitored during the procedure by Radiology nursing. PROCEDURE: The procedure risks, benefits, and alternatives were explained to the patient. Questions regarding the procedure were encouraged and answered. The patient understands and consents to the procedure. CT was performed through the chest in a supine position. The left anterior chest wall was prepped with chlorhexidine in a sterile fashion, and a sterile drape was applied covering the operative field. A sterile gown and sterile gloves were used for the procedure. Local anesthesia was provided with 1% Lidocaine. Under  CT guidance, a 17 gauge trocar needle was advanced into the anterior left subpleural lung. After confirming needle tip position, 3 separate 18 gauge core biopsy samples were obtained through the outer needle. Material was submitted in formalin. Upon completion, the Biosentry device was utilized in depositing a plug at the pleural entry site as the outer needle was removed. COMPLICATIONS: None FINDINGS: Anterior subpleural component tumor in the left upper lobe was targeted immediately deep to the pleural surface. The additional deeper left upper lobe nodule is not excessive bola to percutaneous biopsy due to surrounding blebs. The region of soft tissue sampled measures roughly 1.6 x 2.9 cm. There were no immediate complications. IMPRESSION: CT-guided core biopsy performed of left upper lobe mass in the anterior subpleural lung. Electronically Signed   By: Aletta Edouard M.D.   On: 08/28/2018 13:36      ASSESSMENT & PLAN:  1. Primary lung adenocarcinoma, left ([place])   2. Hepatocellular carcinoma metastatic to left lung ([place])   3. Hyponatremia    #HCC, currently on lenvatinib, tolerating well. Labs are reviewed and discussed with patient. Continue lenvatinib.  # Lung adenocarcinoma, cT3 Nx Mx, he will be starting SBRT to lung lesion. #Continue follow-up with palliative care for discussion of CODE STATUS.Marland Kitchen  #Hyponatremia, sodium level slightly low.  Encourage oral hydration We spent sufficient time to discuss many aspect of care, questions were answered to patient's satisfaction.   Orders Placed This Encounter  Procedures  . Comprehensive metabolic panel    Standing Status:   Future    Standing Expiration Date:   09/22/2019  . CBC with Differential/Platelet    Standing Status:   Future    Standing Expiration Date:   09/22/2019  . TSH    Standing Status:   Future    Standing Expiration Date:   10/23/2018  .  Amb Referral to Nutrition and Diabetic E    Referral Priority:   Routine    Referral  Type:   Consultation    Referral Reason:   Specialty Services Required    Number of Visits Requested:   1    All questions were answered. The patient knows to call the clinic with any problems questions or concerns. Return of visit:  2 weeks.    Earlie Server, MD, PhD 09/23/2018

## 2018-10-02 ENCOUNTER — Other Ambulatory Visit: Payer: Self-pay

## 2018-10-02 ENCOUNTER — Ambulatory Visit
Admission: RE | Admit: 2018-10-02 | Discharge: 2018-10-02 | Disposition: A | Discharge status: Home / Self Care | Payer: Medicare Other | Source: Ambulatory Visit | Attending: Radiation Oncology | Admitting: Radiation Oncology

## 2018-10-02 DIAGNOSIS — C7802 Secondary malignant neoplasm of left lung: Secondary | ICD-10-CM | POA: Diagnosis not present

## 2018-10-02 DIAGNOSIS — Z510 Encounter for antineoplastic radiation therapy: Secondary | ICD-10-CM | POA: Diagnosis not present

## 2018-10-02 DIAGNOSIS — C220 Liver cell carcinoma: Secondary | ICD-10-CM | POA: Diagnosis not present

## 2018-10-02 DIAGNOSIS — C7951 Secondary malignant neoplasm of bone: Secondary | ICD-10-CM | POA: Diagnosis not present

## 2018-10-02 DIAGNOSIS — Z87891 Personal history of nicotine dependence: Secondary | ICD-10-CM | POA: Diagnosis not present

## 2018-10-02 DIAGNOSIS — C3412 Malignant neoplasm of upper lobe, left bronchus or lung: Secondary | ICD-10-CM | POA: Diagnosis not present

## 2018-10-03 ENCOUNTER — Other Ambulatory Visit: Payer: Self-pay

## 2018-10-03 ENCOUNTER — Inpatient Hospital Stay: Payer: Medicare Other

## 2018-10-03 ENCOUNTER — Ambulatory Visit
Admission: RE | Admit: 2018-10-03 | Discharge: 2018-10-03 | Disposition: A | Discharge status: Home / Self Care | Payer: Medicare Other | Source: Ambulatory Visit | Attending: Radiation Oncology | Admitting: Radiation Oncology

## 2018-10-03 ENCOUNTER — Inpatient Hospital Stay (HOSPITAL_BASED_OUTPATIENT_CLINIC_OR_DEPARTMENT_OTHER): Payer: Medicare Other | Admitting: Nurse Practitioner

## 2018-10-03 ENCOUNTER — Other Ambulatory Visit: Payer: Self-pay | Admitting: *Deleted

## 2018-10-03 VITALS — BP 88/62 | HR 100 | Temp 96.7°F | Resp 20

## 2018-10-03 DIAGNOSIS — C7802 Secondary malignant neoplasm of left lung: Secondary | ICD-10-CM

## 2018-10-03 DIAGNOSIS — J984 Other disorders of lung: Secondary | ICD-10-CM | POA: Diagnosis not present

## 2018-10-03 DIAGNOSIS — J948 Other specified pleural conditions: Secondary | ICD-10-CM | POA: Diagnosis not present

## 2018-10-03 DIAGNOSIS — M25512 Pain in left shoulder: Secondary | ICD-10-CM | POA: Diagnosis not present

## 2018-10-03 DIAGNOSIS — K5900 Constipation, unspecified: Secondary | ICD-10-CM | POA: Diagnosis not present

## 2018-10-03 DIAGNOSIS — R0602 Shortness of breath: Secondary | ICD-10-CM | POA: Diagnosis not present

## 2018-10-03 DIAGNOSIS — I890 Lymphedema, not elsewhere classified: Secondary | ICD-10-CM | POA: Diagnosis not present

## 2018-10-03 DIAGNOSIS — E871 Hypo-osmolality and hyponatremia: Secondary | ICD-10-CM | POA: Diagnosis not present

## 2018-10-03 DIAGNOSIS — C220 Liver cell carcinoma: Secondary | ICD-10-CM

## 2018-10-03 DIAGNOSIS — R634 Abnormal weight loss: Secondary | ICD-10-CM | POA: Diagnosis not present

## 2018-10-03 DIAGNOSIS — I712 Thoracic aortic aneurysm, without rupture: Secondary | ICD-10-CM | POA: Diagnosis not present

## 2018-10-03 DIAGNOSIS — R531 Weakness: Secondary | ICD-10-CM

## 2018-10-03 DIAGNOSIS — I951 Orthostatic hypotension: Secondary | ICD-10-CM | POA: Diagnosis not present

## 2018-10-03 DIAGNOSIS — C3412 Malignant neoplasm of upper lobe, left bronchus or lung: Secondary | ICD-10-CM | POA: Diagnosis not present

## 2018-10-03 DIAGNOSIS — Z87891 Personal history of nicotine dependence: Secondary | ICD-10-CM | POA: Diagnosis not present

## 2018-10-03 DIAGNOSIS — C7951 Secondary malignant neoplasm of bone: Secondary | ICD-10-CM | POA: Diagnosis not present

## 2018-10-03 DIAGNOSIS — E860 Dehydration: Secondary | ICD-10-CM | POA: Diagnosis not present

## 2018-10-03 DIAGNOSIS — I10 Essential (primary) hypertension: Secondary | ICD-10-CM | POA: Diagnosis not present

## 2018-10-03 DIAGNOSIS — R42 Dizziness and giddiness: Secondary | ICD-10-CM | POA: Diagnosis not present

## 2018-10-03 DIAGNOSIS — Z510 Encounter for antineoplastic radiation therapy: Secondary | ICD-10-CM | POA: Diagnosis not present

## 2018-10-03 DIAGNOSIS — R630 Anorexia: Secondary | ICD-10-CM | POA: Diagnosis not present

## 2018-10-03 LAB — CBC WITH DIFFERENTIAL/PLATELET
Abs Immature Granulocytes: 0.01 10*3/uL (ref 0.00–0.07)
Basophils Absolute: 0 10*3/uL (ref 0.0–0.1)
Basophils Relative: 1 %
Eosinophils Absolute: 0.1 10*3/uL (ref 0.0–0.5)
Eosinophils Relative: 3 %
HCT: 46.1 % (ref 39.0–52.0)
Hemoglobin: 15.8 g/dL (ref 13.0–17.0)
Immature Granulocytes: 0 %
Lymphocytes Relative: 33 %
Lymphs Abs: 1.6 10*3/uL (ref 0.7–4.0)
MCH: 29.4 pg (ref 26.0–34.0)
MCHC: 34.3 g/dL (ref 30.0–36.0)
MCV: 85.7 fL (ref 80.0–100.0)
Monocytes Absolute: 0.6 10*3/uL (ref 0.1–1.0)
Monocytes Relative: 14 %
Neutro Abs: 2.3 10*3/uL (ref 1.7–7.7)
Neutrophils Relative %: 49 %
Platelets: 503 10*3/uL — ABNORMAL HIGH (ref 150–400)
RBC: 5.38 MIL/uL (ref 4.22–5.81)
RDW: 13.8 % (ref 11.5–15.5)
WBC: 4.7 10*3/uL (ref 4.0–10.5)
nRBC: 0 % (ref 0.0–0.2)

## 2018-10-03 LAB — COMPREHENSIVE METABOLIC PANEL
ALT: 27 U/L (ref 0–44)
AST: 36 U/L (ref 15–41)
Albumin: 3.1 g/dL — ABNORMAL LOW (ref 3.5–5.0)
Alkaline Phosphatase: 76 U/L (ref 38–126)
Anion gap: 12 (ref 5–15)
BUN: 10 mg/dL (ref 8–23)
CO2: 24 mmol/L (ref 22–32)
Calcium: 9 mg/dL (ref 8.9–10.3)
Chloride: 100 mmol/L (ref 98–111)
Creatinine, Ser: 1.22 mg/dL (ref 0.61–1.24)
GFR calc Af Amer: >60 mL/min (ref >60)
GFR calc non Af Amer: >60 mL/min (ref >60)
Glucose, Bld: 123 mg/dL — ABNORMAL HIGH (ref 70–99)
Potassium: 4 mmol/L (ref 3.5–5.1)
Sodium: 136 mmol/L (ref 135–145)
Total Bilirubin: 0.6 mg/dL (ref 0.3–1.2)
Total Protein: 8.3 g/dL — ABNORMAL HIGH (ref 6.5–8.1)

## 2018-10-03 MED ORDER — SODIUM CHLORIDE 0.9 % IV SOLN
Freq: Once | INTRAVENOUS | Status: AC
  Administered 2018-10-03: 10:00:00 via INTRAVENOUS
  Filled 2018-10-03: qty 250

## 2018-10-03 NOTE — Progress Notes (Signed)
[facility]  Telephone:([phone] [phone]  Patient Care Team: Paulene Floor as PCP - General (Physician Assistant) Telford Nab, RN as Registered Nurse Clent Jacks, RN as Registered Nurse   Name of the patient: Samuel Hood  [id number]  Oct 21, 1950   Date of visit: 10/03/18  Diagnosis- Metastatic Hepatocellular Carcinoma  Chief complaint/ Reason for visit- Dizziness  Heme/Onc history:  Marcella Dunnaway. is a  68 y.o.  male with PMH listed below was seen in consultation at the request of  Terrilee Croak, Adriana M, PA-C  for evaluation of lung nodule in the liver lesion. Patient has a past medical history of hypertension, alcohol abuse, smoking emphysema.  He has had serial CTs done in the past.  CT images were independently reviewed by me. 07/26/2017 CT chest with contrast showed extensive pleural-parenchymal scarring within the left upper lobe with several areas of soft tissue nodularity measuring up to 1.6 cm.  In this patient who is at increased risk of lung cancer further investigation with PET scan is recommended. Small chronic appearance loculated hydropneumothorax overlies the left apex. Slowly enlarging lesion within the central portion of the right lobe of liver identified. Per note, patient supposed to have additional work-up done in December repeat CT scan which he did not.   Patient was recently seen by primary care provider and has subsequent image done for follow-up. CT on 06/30/2018 showed multiple significantly enlarged paraspinal mass are noted concerning.  The largest measures 3.8 x 0.5 cm and there appears to be a lytic destruction of the adjacent T11 vertebral body.  Also noted is new solid density measuring 17 x 12 mm in the pleural parenchymal density in the left upper lobe noted on prior exam.  Concerning for malignancy.  4.4 ascending thoracic aortic aneurysm.  Recommend annual imaging.  Patient  had PET scan done on 07/06/2018 Which showed there are 2 FDG avid lesion within the left upper lobe worrisome for primary lung neoplasm.  There is evidence of chest wall involvement.  Metastasis to the posterior mediastinum is identified with extension into T11 vertebra and possible involvement of the left T12 neural foramina.  Patient reports left shoulder pain.  Smoking half a pack a day not motivated with further questioning quitting. Denies weight loss, hemoptysis, cough.  Chronic shortness of breath with exertion. He drinks alcohol daily. Lives with his girlfriend.  He has 5 adult kids  - Hepatitis C  - 07/17/2018  CT-guided biopsy of left-sided posterior mediastinal soft tissue adjacent to T10/11. Patient's case was discussed on tumor board. Consensus was reached that [facility] versus primary lung cancer with hepatoid adenocarcinoma features. Consensus was to proceed with liver biopsy first as liver mass was not FDG avid on PET scan.  Patient underwent liver biopsy and the present to discuss pathology reports.  - 08/08/2018 Liver mass biopsy showed fragments of necrotic and calcified tissue with adjacent fibrous capsule  Scant viable nonneoplastic liver tissue with steatosis, inflammation and non specific fibrosis.  - 08/28/2018 CT guided left upper lobe lung mass biopsy showed poorly differentiated adenocarcinoma of lung origin.   6/19-08/16/2018  Finished palliative radiation to paraspinal soft tissue mass.  Started Lenvatinib 12 mg on 09/07/2018. He is receiving radiation to lung mass.    Interval history- Ronda Fairly, Brooke Bonito. 68 year old male with above history of hepatocellular carcinoma who [facility] for low blood pressure and dizziness. Symptoms started yesterday and  Have  persisted since that time. He did not take his amlodipine due to symptoms. He says is 'laid in the bed' most of the day. He feels tired and weak but was able to drive to his appointment today. He feels  that the chemo pills have made him feel poorly. He lives alone. He says appetite is stable. He has been drinking Boost daily. Moving bowels normally.  Denies recent fevers or illnesses. Denies any easy bleeding or bruising. Reports good appetite and denies weight loss. Denies chest pain. Denies any nausea, vomiting, constipation, or diarrhea. Denies urinary complaints. Patient offers no further specific complaints today.  ECOG FS:3 - Symptomatic, >50% confined to bed  Review of systems- Review of Systems  Constitutional: Positive for malaise/fatigue and weight loss. Negative for chills and fever.  HENT: Negative for hearing loss, nosebleeds, sore throat and tinnitus.   Eyes: Negative for blurred vision and double vision.  Respiratory: Negative for cough, hemoptysis, shortness of breath and wheezing.   Cardiovascular: Negative for chest pain, palpitations and leg swelling.  Gastrointestinal: Negative for abdominal pain, blood in stool, constipation, diarrhea, melena, nausea and vomiting.  Genitourinary: Negative for dysuria and urgency.  Musculoskeletal: Negative for back pain, falls, joint pain and myalgias.  Skin: Negative for itching and rash.  Neurological: Positive for dizziness and weakness. Negative for tingling, sensory change, loss of consciousness and headaches.  Endo/Heme/Allergies: Negative for environmental allergies. Does not bruise/bleed easily.  Psychiatric/Behavioral: Negative for depression and substance abuse. The patient is not nervous/anxious and does not have insomnia.     Current treatment- radiation & lenvatinib  No Known Allergies  Past Medical History:  Diagnosis Date  . Hepatocellular carcinoma metastatic to left lung ([place]) 07/24/2018  . History of angiography    left lower extremity  . Hypertension   . Small bowel obstruction [facility])     Past Surgical History:  Procedure Laterality Date  . COLON SURGERY    . COLONOSCOPY W/ POLYPECTOMY    . COLONOSCOPY WITH  PROPOFOL N/A 01/31/2018   Procedure: COLONOSCOPY WITH PROPOFOL;  Surgeon: Toledo, Benay Pike, MD;  Location: [facility] ENDOSCOPY;  Service: Gastroenterology;  Laterality: N/A;  . debridement fasciotomy leg, left Left 03/19/2016  . LAPAROTOMY N/A 09/27/2014   Procedure: EXPLORATORY LAPAROTOMY;  Surgeon: Dia Crawford III, MD;  Location: [facility] ORS;  Service: General;  Laterality: N/A;    Social History   Socioeconomic History  . Marital status: Single    Spouse name: Not on file  . Number of children: Not on file  . Years of education: Not on file  . Highest education level: Not on file  Occupational History  . Occupation: retired  Scientific laboratory technician  . Financial resource strain: Hard  . Food insecurity    Worry: Never true    Inability: Never true  . Transportation needs    Medical: No    Non-medical: No  Tobacco Use  . Smoking status: Former Smoker    Packs/day: 0.25    Years: 50.00    Pack years: 12.50  . Smokeless tobacco: Never Used  . Tobacco comment: 1-2/week  Substance and Sexual Activity  . Alcohol use: Yes    Alcohol/week: 35.0 standard drinks    Types: 30 Cans of beer, 5 Shots of liquor per week  . Drug use: No  . Sexual activity: Not on file  Lifestyle  . Physical activity    Days per week: 7 days    Minutes per session: 30 min  . Stress: Not at  all  Relationships  . Social connections    Talks on phone: More than three times a week    Gets together: More than three times a week    Attends religious service: More than 4 times per year    Active member of club or organization: Not on file    Attends meetings of clubs or organizations: Not on file    Relationship status: Not on file  . Intimate partner violence    Fear of current or ex partner: Patient refused    Emotionally abused: No    Physically abused: No    Forced sexual activity: No  Other Topics Concern  . Not on file  Social History Narrative  . Not on file    Family History  Problem Relation Age of Onset   . Cancer Mother   . Cancer Father      Current Outpatient Medications:  .  amLODipine (NORVASC) 5 MG tablet, Take 1 tablet (5 mg total) by mouth daily., Disp: 90 tablet, Rfl: 0 .  ibuprofen (ADVIL,MOTRIN) 200 MG tablet, Take 200 mg by mouth daily as needed., Disp: , Rfl:  .  Lenvatinib 12 mg daily dose (LENVIMA) 3 x 4 MG capsule, Take 12 mg by mouth daily., Disp: 90 capsule, Rfl: 0 .  Multiple Vitamin (MULTIVITAMIN) capsule, Take 1 capsule by mouth daily., Disp: , Rfl:  .  naproxen (NAPROSYN) 500 MG tablet, Take 500 mg by mouth 2 (two) times daily with a meal., Disp: , Rfl:  .  loperamide (IMODIUM) 2 MG capsule, Take 1 capsule (2 mg total) by mouth See admin instructions. With onset of loose stool, take 4mg  followed by 2mg  every 2 hours until 12 hours have passed without loose bowel movement. Maximum: 16 mg/day (Patient not taking: Reported on 09/22/2018), Disp: 60 capsule, Rfl: 1  Physical exam:  Vitals:   10/03/18 1015  BP: (!) 88/62  Pulse: 100  Resp: 20  Temp: (!) 96.7 F (35.9 C)  TempSrc: Tympanic   Physical Exam Constitutional:      General: He is not in acute distress.    Appearance: He is well-developed.     Comments: Thin built, frail appearing.  HENT:     Head: Normocephalic and atraumatic.     Nose: Nose normal.     Mouth/Throat:     Pharynx: No oropharyngeal exudate.  Eyes:     General: No scleral icterus.    Conjunctiva/sclera: Conjunctivae normal.     Pupils: Pupils are equal, round, and reactive to light.  Neck:     Musculoskeletal: Normal range of motion and neck supple.  Cardiovascular:     Rate and Rhythm: Normal rate and regular rhythm.     Heart sounds: Normal heart sounds.  Pulmonary:     Effort: Pulmonary effort is normal.     Breath sounds: Normal breath sounds. No wheezing.  Abdominal:     General: Bowel sounds are normal. There is no distension.     Palpations: Abdomen is soft.     Tenderness: There is no abdominal tenderness.   Musculoskeletal: Normal range of motion.  Skin:    General: Skin is warm and dry.  Neurological:     Mental Status: He is alert and oriented to person, place, and time.     Motor: No weakness.     Gait: Gait normal.  Psychiatric:        Behavior: Behavior normal.      CMP Latest Ref Rng & Units  10/03/2018  Glucose 70 - 99 mg/dL 123(H)  BUN 8 - 23 mg/dL 10  Creatinine 0.61 - 1.24 mg/dL 1.22  Sodium 135 - 145 mmol/L 136  Potassium 3.5 - 5.1 mmol/L 4.0  Chloride 98 - 111 mmol/L 100  CO2 22 - 32 mmol/L 24  Calcium 8.9 - 10.3 mg/dL 9.0  Total Protein 6.5 - 8.1 g/dL 8.3(H)  Total Bilirubin 0.3 - 1.2 mg/dL 0.6  Alkaline Phos 38 - 126 U/L 76  AST 15 - 41 U/L 36  ALT 0 - 44 U/L 27   CBC Latest Ref Rng & Units 10/03/2018  WBC 4.0 - 10.5 K/uL 4.7  Hemoglobin 13.0 - 17.0 g/dL 15.8  Hematocrit 39.0 - 52.0 % 46.1  Platelets 150 - 400 K/uL 503(H)    No images are attached to the encounter.  No results found.  Assessment and plan- Patient is a 68 y.o. male diagnosed with metastatic hepatocellular carcinoma who presents to [facility] for dizziness.   1. Orthostatic Hypotension- suspect secondary to lenvatinib, radiation, and poor oral intake. Labs today overall stable. No hyponatremia. Albumin decreased at 3.1. Platelets elevated likely reactive. BP 88/62, HR 100. Standing 79/66 HR 115.   - Findings discussed with Dr. Tasia Catchings who contributed to and agrees with plan of care - hold lenvatinib - amlodipine discontinued - iv fluids in clinic - samples of boost & ensure provided today - refer to home health- Will see if Gwenette Greet, PT/OT can see him in clinic tomorrow for evaluation - rtc tomorrow for f/u in symptom management with josh; recheck bp, possible more iv fluids. Consider restarting lenvatinib if symptoms resolved.    Visit Diagnosis 1. Orthostatic hypotension   2. Hepatocellular carcinoma metastatic to left lung [facility])     Patient expressed understanding and was in  agreement with this plan. He also understands that He can call clinic at any time with any questions, concerns, or complaints.   Thank you for allowing me to participate in the care of this very pleasant patient.   Beckey Rutter, DNP, AGNP-C [facility] (work cell) [phone] (office)  CC: Dr. Tasia Catchings, Billey Chang, NP

## 2018-10-04 ENCOUNTER — Inpatient Hospital Stay (HOSPITAL_BASED_OUTPATIENT_CLINIC_OR_DEPARTMENT_OTHER): Payer: Medicare Other | Admitting: Hospice and Palliative Medicine

## 2018-10-04 ENCOUNTER — Encounter: Payer: Self-pay | Admitting: Nurse Practitioner

## 2018-10-04 ENCOUNTER — Inpatient Hospital Stay: Payer: Medicare Other | Admitting: Occupational Therapy

## 2018-10-04 ENCOUNTER — Encounter: Payer: Self-pay | Admitting: Hospice and Palliative Medicine

## 2018-10-04 ENCOUNTER — Ambulatory Visit
Admission: RE | Admit: 2018-10-04 | Discharge: 2018-10-04 | Disposition: A | Discharge status: Home / Self Care | Payer: Medicare Other | Source: Ambulatory Visit | Attending: Radiation Oncology | Admitting: Radiation Oncology

## 2018-10-04 ENCOUNTER — Inpatient Hospital Stay: Payer: Medicare Other

## 2018-10-04 ENCOUNTER — Other Ambulatory Visit: Payer: Self-pay

## 2018-10-04 VITALS — BP 92/70 | HR 90 | Temp 97.5°F | Resp 18

## 2018-10-04 DIAGNOSIS — R531 Weakness: Secondary | ICD-10-CM

## 2018-10-04 DIAGNOSIS — I959 Hypotension, unspecified: Secondary | ICD-10-CM | POA: Diagnosis not present

## 2018-10-04 DIAGNOSIS — I890 Lymphedema, not elsewhere classified: Secondary | ICD-10-CM | POA: Diagnosis not present

## 2018-10-04 DIAGNOSIS — C220 Liver cell carcinoma: Secondary | ICD-10-CM

## 2018-10-04 DIAGNOSIS — C7802 Secondary malignant neoplasm of left lung: Secondary | ICD-10-CM

## 2018-10-04 DIAGNOSIS — I712 Thoracic aortic aneurysm, without rupture: Secondary | ICD-10-CM | POA: Diagnosis not present

## 2018-10-04 DIAGNOSIS — E871 Hypo-osmolality and hyponatremia: Secondary | ICD-10-CM | POA: Diagnosis not present

## 2018-10-04 DIAGNOSIS — E860 Dehydration: Secondary | ICD-10-CM

## 2018-10-04 DIAGNOSIS — C7951 Secondary malignant neoplasm of bone: Secondary | ICD-10-CM | POA: Diagnosis not present

## 2018-10-04 DIAGNOSIS — Z87891 Personal history of nicotine dependence: Secondary | ICD-10-CM | POA: Diagnosis not present

## 2018-10-04 DIAGNOSIS — R634 Abnormal weight loss: Secondary | ICD-10-CM | POA: Diagnosis not present

## 2018-10-04 DIAGNOSIS — C3412 Malignant neoplasm of upper lobe, left bronchus or lung: Secondary | ICD-10-CM | POA: Diagnosis not present

## 2018-10-04 DIAGNOSIS — R630 Anorexia: Secondary | ICD-10-CM | POA: Diagnosis not present

## 2018-10-04 DIAGNOSIS — R0602 Shortness of breath: Secondary | ICD-10-CM | POA: Diagnosis not present

## 2018-10-04 DIAGNOSIS — R42 Dizziness and giddiness: Secondary | ICD-10-CM | POA: Diagnosis not present

## 2018-10-04 DIAGNOSIS — I951 Orthostatic hypotension: Secondary | ICD-10-CM | POA: Diagnosis not present

## 2018-10-04 DIAGNOSIS — J948 Other specified pleural conditions: Secondary | ICD-10-CM | POA: Diagnosis not present

## 2018-10-04 DIAGNOSIS — Z510 Encounter for antineoplastic radiation therapy: Secondary | ICD-10-CM | POA: Diagnosis not present

## 2018-10-04 DIAGNOSIS — M25512 Pain in left shoulder: Secondary | ICD-10-CM | POA: Diagnosis not present

## 2018-10-04 DIAGNOSIS — J984 Other disorders of lung: Secondary | ICD-10-CM | POA: Diagnosis not present

## 2018-10-04 DIAGNOSIS — K5900 Constipation, unspecified: Secondary | ICD-10-CM | POA: Diagnosis not present

## 2018-10-04 DIAGNOSIS — I10 Essential (primary) hypertension: Secondary | ICD-10-CM | POA: Diagnosis not present

## 2018-10-04 MED ORDER — SODIUM CHLORIDE 0.9 % IV SOLN
Freq: Once | INTRAVENOUS | Status: AC
  Administered 2018-10-04: 10:00:00 via INTRAVENOUS
  Filled 2018-10-04: qty 250

## 2018-10-04 NOTE — Therapy (Signed)
[facility] [address] [place], [place], [place], [address] Phone: [phone]   Fax:  [phone]  Occupational Therapy Evaluation  Patient Details  Name: Samuel Hood. MRN: [id number] Date of Birth: June 12, 1950 No data recorded  Encounter Date: 10/04/2018    Past Medical History:  Diagnosis Date  . Hepatocellular carcinoma metastatic to left lung ([place]) 07/24/2018  . History of angiography    left lower extremity  . Hypertension   . Small bowel obstruction [facility])     Past Surgical History:  Procedure Laterality Date  . COLON SURGERY    . COLONOSCOPY W/ POLYPECTOMY    . COLONOSCOPY WITH PROPOFOL N/A 01/31/2018   Procedure: COLONOSCOPY WITH PROPOFOL;  Surgeon: Toledo, Benay Pike, MD;  Location: [facility] ENDOSCOPY;  Service: Gastroenterology;  Laterality: N/A;  . debridement fasciotomy leg, left Left 03/19/2016  . LAPAROTOMY N/A 09/27/2014   Procedure: EXPLORATORY LAPAROTOMY;  Surgeon: Dia Crawford III, MD;  Location: [facility] ORS;  Service: General;  Laterality: N/A;    There were no vitals filed for this visit.  Subjective Assessment - 10/04/18 1126    Subjective   I have BP cuff at home - don't think I need home health therapy - I mowed my own lawn last Wed and Thurs, doing things around the house, some laundery - did not had any falls          Ronda Fairly, Brooke Bonito. 68 year old male with history of hepatocellular carcinoma who presents to  Yesterday at [facility] for low blood pressure and dizziness. Symptoms started  Monday  and  Have persisted since that time. He did not take his amlodipine due to symptoms. He says is 'laid in the bed' most of the day. He feels tired and weak but was able to drive to his appointment yesterday. He feels that the chemo pills have made him feel poorly. He has girlfriend. He says appetite is stable. He has been drinking Boost daily. Moving bowels normally.  Denies recent fevers or  illnesses. Denies any easy bleeding or bruising. Reports good appetite and denies weight loss. Denies chest pain. Denies any nausea, vomiting, constipation, or diarrhea. Denies urinary complaints.   Pt refer for Rehab consult my Beckey Rutter NP  Pt this date again here for follow up and got some more fluid. Pt seen for strength and balance assessment  Strength in LE in all ranges and joints 5/5 Berg balance test pt scored 51/56 - meaning low risk for fall   Would recommend at this time no OT or PT intervention - did provide pt with hand out and review fall prevention and energy conservation principles. Pt also advice to check his BP at home sitting and standing - write down to tell MD/NP with appt at [facility] center. Thank you for referral                                Patient will benefit from skilled therapeutic intervention in order to improve the following deficits and impairments:           Visit Diagnosis: 1. Weakness       Problem List Patient Active Problem List   Diagnosis Date Noted  . Primary lung adenocarcinoma, left ([place]) 09/02/2018  . Chronic active hepatitis ([place]) 07/28/2018  . Elevated PSA 07/28/2018  . Hepatocellular carcinoma metastatic to left lung ([place]) 07/24/2018  . Paraspinal mass  07/24/2018  . Goals of care, counseling/discussion 07/07/2018  . Liver mass 06/23/2018  . Lung nodule 06/23/2018  . Chronic bullous emphysema ([place]) 06/23/2018  . Hypertension 03/26/2016  . Small bowel obstruction ([place])   . Intestinal obstruction ([place]) 09/26/2014  . Essential hypertension 06/19/2014  . Hypokalemia 06/19/2014  . Bowel obstruction ([place]) 06/18/2014    Rosalyn Gess OTR/L,CLT 10/04/2018, 11:28 AM  [facility] [address] [place], [place], [place], [address] Phone: [phone]   Fax:  [phone]  Name: Samuel Hood. MRN: [id number] Date of Birth: [birth year]

## 2018-10-04 NOTE — Progress Notes (Signed)
[facility] [place] [facility]  [phone] Fax:([phone]  Patient Care Team: Paulene Floor as PCP - General (Physician Assistant) Telford Nab, RN as Registered Nurse Clent Jacks, RN as Registered Nurse   Name of the patient: Samuel Hood  [id number]  07-Sep-1950   Date of visit: 10/04/18  Diagnosis- Metastatic Hepatocellular Carcinoma  Chief complaint/ Reason for visit-follow-up from yesterday's visit regarding hypertension and dizziness  Heme/Onc history:  Oncology History   No history exists.    Interval history-patient was seen in the symptomatically yesterday for hypotension and dizziness.  He received IV fluids and felt markedly improved afterwards.  Labs were unremarkable.  Overnight, patient says that his dizziness has improved.  He has tried to increase his oral intake.  He has been holding amlodipine and lenvatinib as directed.  Denies any neurologic complaints. Denies recent fevers or illnesses. Denies any easy bleeding or bruising. Reports good appetite and denies weight loss. Denies chest pain. Denies any nausea, vomiting, constipation, or diarrhea. Denies urinary complaints. Patient offers no further specific complaints today.     Current treatment-lenvatinib  No Known Allergies  Past Medical History:  Diagnosis Date  . Hepatocellular carcinoma metastatic to left lung ([place]) 07/24/2018  . History of angiography    left lower extremity  . Hypertension   . Small bowel obstruction [facility])     Past Surgical History:  Procedure Laterality Date  . COLON SURGERY    . COLONOSCOPY W/ POLYPECTOMY    . COLONOSCOPY WITH PROPOFOL N/A 01/31/2018   Procedure: COLONOSCOPY WITH PROPOFOL;  Surgeon: Toledo, Benay Pike, MD;  Location: [facility] ENDOSCOPY;  Service: Gastroenterology;  Laterality: N/A;  . debridement fasciotomy leg, left Left 03/19/2016  . LAPAROTOMY N/A 09/27/2014   Procedure: EXPLORATORY LAPAROTOMY;   Surgeon: Dia Crawford III, MD;  Location: [facility] ORS;  Service: General;  Laterality: N/A;    Social History   Socioeconomic History  . Marital status: Single    Spouse name: Not on file  . Number of children: Not on file  . Years of education: Not on file  . Highest education level: Not on file  Occupational History  . Occupation: retired  Scientific laboratory technician  . Financial resource strain: Hard  . Food insecurity    Worry: Never true    Inability: Never true  . Transportation needs    Medical: No    Non-medical: No  Tobacco Use  . Smoking status: Former Smoker    Packs/day: 0.25    Years: 50.00    Pack years: 12.50  . Smokeless tobacco: Never Used  . Tobacco comment: 1-2/week  Substance and Sexual Activity  . Alcohol use: Yes    Alcohol/week: 35.0 standard drinks    Types: 30 Cans of beer, 5 Shots of liquor per week  . Drug use: No  . Sexual activity: Not on file  Lifestyle  . Physical activity    Days per week: 7 days    Minutes per session: 30 min  . Stress: Not at all  Relationships  . Social connections    Talks on phone: More than three times a week    Gets together: More than three times a week    Attends religious service: More than 4 times per year    Active member of club or organization: Not on file    Attends meetings of clubs or organizations: Not on file    Relationship status: Not on file  . Intimate partner violence  Fear of current or ex partner: Patient refused    Emotionally abused: No    Physically abused: No    Forced sexual activity: No  Other Topics Concern  . Not on file  Social History Narrative  . Not on file    Family History  Problem Relation Age of Onset  . Cancer Mother   . Cancer Father      Current Outpatient Medications:  .  ibuprofen (ADVIL,MOTRIN) 200 MG tablet, Take 200 mg by mouth daily as needed., Disp: , Rfl:  .  Lenvatinib 12 mg daily dose (LENVIMA) 3 x 4 MG capsule, Take 12 mg by mouth daily., Disp: 90 capsule, Rfl: 0 .   Multiple Vitamin (MULTIVITAMIN) capsule, Take 1 capsule by mouth daily., Disp: , Rfl:  .  naproxen (NAPROSYN) 500 MG tablet, Take 500 mg by mouth 2 (two) times daily with a meal., Disp: , Rfl:  .  loperamide (IMODIUM) 2 MG capsule, Take 1 capsule (2 mg total) by mouth See admin instructions. With onset of loose stool, take 4mg  followed by 2mg  every 2 hours until 12 hours have passed without loose bowel movement. Maximum: 16 mg/day (Patient not taking: Reported on 09/22/2018), Disp: 60 capsule, Rfl: 1  Physical exam:  Vitals:   10/04/18 0945 10/04/18 0949  BP: 92/70   Pulse: 90   Resp:  18  Temp: (!) 97.5 F (36.4 C)   TempSrc: Tympanic    Physical Exam Constitutional:      Appearance: Normal appearance.  Cardiovascular:     Rate and Rhythm: Normal rate and regular rhythm.  Pulmonary:     Effort: Pulmonary effort is normal.     Breath sounds: Normal breath sounds.  Abdominal:     General: Abdomen is flat. Bowel sounds are normal.     Palpations: Abdomen is soft.  Skin:    General: Skin is warm and dry.  Neurological:     General: No focal deficit present.     Mental Status: He is alert.      CMP Latest Ref Rng & Units 10/03/2018  Glucose 70 - 99 mg/dL 123(H)  BUN 8 - 23 mg/dL 10  Creatinine 0.61 - 1.24 mg/dL 1.22  Sodium 135 - 145 mmol/L 136  Potassium 3.5 - 5.1 mmol/L 4.0  Chloride 98 - 111 mmol/L 100  CO2 22 - 32 mmol/L 24  Calcium 8.9 - 10.3 mg/dL 9.0  Total Protein 6.5 - 8.1 g/dL 8.3(H)  Total Bilirubin 0.3 - 1.2 mg/dL 0.6  Alkaline Phos 38 - 126 U/L 76  AST 15 - 41 U/L 36  ALT 0 - 44 U/L 27   CBC Latest Ref Rng & Units 10/03/2018  WBC 4.0 - 10.5 K/uL 4.7  Hemoglobin 13.0 - 17.0 g/dL 15.8  Hematocrit 39.0 - 52.0 % 46.1  Platelets 150 - 400 K/uL 503(H)    No images are attached to the encounter.  No results found.  Assessment and plan- Patient is a 68 y.o. male with metastatic hepatocellular carcinoma on lenvatinib who presents to the clinic for follow-up of  dehydration.  1.  HCC -hold lenvatinib today.  Patient will see Dr. Tasia Catchings tomorrow and vitals will be rechecked with decision made to resume treatment at that time  2.  Dehydration -patient reports overall feeling improved today.  He is trying to increase oral intake of fluids.  He remains hypotensive, although improved from yesterday and no longer orthostatic.  We will give 500 mL normal saline via IV today.  3.  Hypotension -continue to hold amlodipine.  Recheck vitals tomorrow in clinic.  4.  Weakness -referral to OT.  Case discussed with OT.    Patient expressed understanding and was in agreement with this plan. He also understands that He can call clinic at any time with any questions, concerns, or complaints.   Thank you for allowing me to participate in the care of this very pleasant patient.   Time Total: 30 minutes  Visit consisted of counseling and education dealing with the complex and emotionally intense issues of symptom management and palliative care in the setting of serious and potentially life-threatening illness.Greater than 50%  of this time was spent counseling and coordinating care related to the above assessment and plan.  Signed by: Altha Harm, PhD, NP-C [phone] (Work Cell)   CC:

## 2018-10-05 ENCOUNTER — Encounter: Payer: Self-pay | Admitting: Oncology

## 2018-10-05 ENCOUNTER — Inpatient Hospital Stay (HOSPITAL_BASED_OUTPATIENT_CLINIC_OR_DEPARTMENT_OTHER): Payer: Medicare Other | Admitting: Oncology

## 2018-10-05 ENCOUNTER — Inpatient Hospital Stay: Payer: Medicare Other

## 2018-10-05 ENCOUNTER — Ambulatory Visit
Admission: RE | Admit: 2018-10-05 | Discharge: 2018-10-05 | Disposition: A | Discharge status: Home / Self Care | Payer: Medicare Other | Source: Ambulatory Visit | Attending: Radiation Oncology | Admitting: Radiation Oncology

## 2018-10-05 ENCOUNTER — Other Ambulatory Visit: Payer: Self-pay

## 2018-10-05 VITALS — BP 103/70 | HR 53 | Temp 98.2°F | Ht 65.0 in | Wt 136.0 lb

## 2018-10-05 DIAGNOSIS — C3492 Malignant neoplasm of unspecified part of left bronchus or lung: Secondary | ICD-10-CM

## 2018-10-05 DIAGNOSIS — R634 Abnormal weight loss: Secondary | ICD-10-CM | POA: Diagnosis not present

## 2018-10-05 DIAGNOSIS — C7802 Secondary malignant neoplasm of left lung: Secondary | ICD-10-CM | POA: Diagnosis not present

## 2018-10-05 DIAGNOSIS — C7951 Secondary malignant neoplasm of bone: Secondary | ICD-10-CM | POA: Diagnosis not present

## 2018-10-05 DIAGNOSIS — I890 Lymphedema, not elsewhere classified: Secondary | ICD-10-CM | POA: Diagnosis not present

## 2018-10-05 DIAGNOSIS — R0602 Shortness of breath: Secondary | ICD-10-CM | POA: Diagnosis not present

## 2018-10-05 DIAGNOSIS — Z510 Encounter for antineoplastic radiation therapy: Secondary | ICD-10-CM | POA: Diagnosis not present

## 2018-10-05 DIAGNOSIS — E860 Dehydration: Secondary | ICD-10-CM | POA: Diagnosis not present

## 2018-10-05 DIAGNOSIS — C220 Liver cell carcinoma: Secondary | ICD-10-CM | POA: Diagnosis not present

## 2018-10-05 DIAGNOSIS — Z7189 Other specified counseling: Secondary | ICD-10-CM

## 2018-10-05 DIAGNOSIS — J984 Other disorders of lung: Secondary | ICD-10-CM | POA: Diagnosis not present

## 2018-10-05 DIAGNOSIS — Z87891 Personal history of nicotine dependence: Secondary | ICD-10-CM | POA: Diagnosis not present

## 2018-10-05 DIAGNOSIS — E871 Hypo-osmolality and hyponatremia: Secondary | ICD-10-CM | POA: Diagnosis not present

## 2018-10-05 DIAGNOSIS — R42 Dizziness and giddiness: Secondary | ICD-10-CM | POA: Diagnosis not present

## 2018-10-05 DIAGNOSIS — I712 Thoracic aortic aneurysm, without rupture: Secondary | ICD-10-CM | POA: Diagnosis not present

## 2018-10-05 DIAGNOSIS — Z5111 Encounter for antineoplastic chemotherapy: Secondary | ICD-10-CM | POA: Diagnosis not present

## 2018-10-05 DIAGNOSIS — C3412 Malignant neoplasm of upper lobe, left bronchus or lung: Secondary | ICD-10-CM | POA: Diagnosis not present

## 2018-10-05 DIAGNOSIS — K5900 Constipation, unspecified: Secondary | ICD-10-CM | POA: Diagnosis not present

## 2018-10-05 DIAGNOSIS — I10 Essential (primary) hypertension: Secondary | ICD-10-CM | POA: Diagnosis not present

## 2018-10-05 DIAGNOSIS — Z125 Encounter for screening for malignant neoplasm of prostate: Secondary | ICD-10-CM | POA: Insufficient documentation

## 2018-10-05 DIAGNOSIS — R630 Anorexia: Secondary | ICD-10-CM | POA: Diagnosis not present

## 2018-10-05 DIAGNOSIS — M25512 Pain in left shoulder: Secondary | ICD-10-CM | POA: Diagnosis not present

## 2018-10-05 DIAGNOSIS — I951 Orthostatic hypotension: Secondary | ICD-10-CM

## 2018-10-05 DIAGNOSIS — J948 Other specified pleural conditions: Secondary | ICD-10-CM | POA: Diagnosis not present

## 2018-10-05 LAB — CBC WITH DIFFERENTIAL/PLATELET
Abs Immature Granulocytes: 0.02 10*3/uL (ref 0.00–0.07)
Basophils Absolute: 0 10*3/uL (ref 0.0–0.1)
Basophils Relative: 0 %
Eosinophils Absolute: 0.2 10*3/uL (ref 0.0–0.5)
Eosinophils Relative: 3 %
HCT: 39 % (ref 39.0–52.0)
Hemoglobin: 13.2 g/dL (ref 13.0–17.0)
Immature Granulocytes: 0 %
Lymphocytes Relative: 19 %
Lymphs Abs: 0.9 10*3/uL (ref 0.7–4.0)
MCH: 29.5 pg (ref 26.0–34.0)
MCHC: 33.8 g/dL (ref 30.0–36.0)
MCV: 87.2 fL (ref 80.0–100.0)
Monocytes Absolute: 0.6 10*3/uL (ref 0.1–1.0)
Monocytes Relative: 12 %
Neutro Abs: 3.3 10*3/uL (ref 1.7–7.7)
Neutrophils Relative %: 66 %
Platelets: 401 10*3/uL — ABNORMAL HIGH (ref 150–400)
RBC: 4.47 MIL/uL (ref 4.22–5.81)
RDW: 14 % (ref 11.5–15.5)
WBC: 5 10*3/uL (ref 4.0–10.5)
nRBC: 0 % (ref 0.0–0.2)

## 2018-10-05 LAB — COMPREHENSIVE METABOLIC PANEL
ALT: 24 U/L (ref 0–44)
AST: 32 U/L (ref 15–41)
Albumin: 2.9 g/dL — ABNORMAL LOW (ref 3.5–5.0)
Alkaline Phosphatase: 72 U/L (ref 38–126)
Anion gap: 8 (ref 5–15)
BUN: 7 mg/dL — ABNORMAL LOW (ref 8–23)
CO2: 27 mmol/L (ref 22–32)
Calcium: 9 mg/dL (ref 8.9–10.3)
Chloride: 106 mmol/L (ref 98–111)
Creatinine, Ser: 0.77 mg/dL (ref 0.61–1.24)
GFR calc Af Amer: >60 mL/min (ref >60)
GFR calc non Af Amer: >60 mL/min (ref >60)
Glucose, Bld: 93 mg/dL (ref 70–99)
Potassium: 3.4 mmol/L — ABNORMAL LOW (ref 3.5–5.1)
Sodium: 141 mmol/L (ref 135–145)
Total Bilirubin: 0.5 mg/dL (ref 0.3–1.2)
Total Protein: 7.3 g/dL (ref 6.5–8.1)

## 2018-10-05 LAB — TSH: TSH: 1.149 u[IU]/mL (ref 0.350–4.500)

## 2018-10-05 NOTE — Progress Notes (Signed)
Patient here today for follow up. Patient denies any decrease in appetite, nausea, vomiting, diarrhea or constipation.  Patient c/o headache.

## 2018-10-05 NOTE — Progress Notes (Signed)
Hematology/Oncology follow up note [facility] Telephone:([phone]) [phone] [phone]   Patient Care Team: Paulene Floor as PCP - General (Physician Assistant) Telford Nab, RN as Registered Nurse Clent Jacks, RN as Registered Nurse  REFERRING PROVIDER: Trinna Post, PA-C  CHIEF COMPLAINTS/REASON FOR VISIT:  Discussion of metastatic cancer management.  HISTORY OF PRESENTING ILLNESS:   Samuel Bonito. is a  68 y.o.  male with PMH listed below was seen in consultation at the request of  Terrilee Croak, Adriana M, PA-C  for evaluation of lung nodule in the liver lesion. Patient has a past medical history of hypertension, alcohol abuse, smoking emphysema.  He has had serial CTs done in the past.  CT images were independently reviewed by me. 07/26/2017 CT chest with contrast showed extensive pleural-parenchymal scarring within the left upper lobe with several areas of soft tissue nodularity measuring up to 1.6 cm.  In this patient who is at increased risk of lung cancer further investigation with PET scan is recommended. Small chronic appearance loculated hydropneumothorax overlies the left apex. Slowly enlarging lesion within the central portion of the right lobe of liver identified. Per note, patient supposed to have additional work-up done in December repeat CT scan which he did not  Patient was recently seen by primary care provider and has subsequent image done for follow-up. CT on 06/30/2018 showed multiple significantly enlarged paraspinal mass are noted concerning.  The largest measures 3.8 x 0.5 cm and there appears to be a lytic destruction of the adjacent T11 vertebral body.  Also noted is new solid density measuring 17 x 12 mm in the pleural parenchymal density in the left upper lobe noted on prior exam.  Concerning for malignancy.  4.4 ascending thoracic aortic aneurysm.  Recommend annual imaging.  Patient had PET scan done on 07/06/2018  Which showed there are 2 FDG avid lesion within the left upper lobe worrisome for primary lung neoplasm.  There is evidence of chest wall involvement.  Metastasis to the posterior mediastinum is identified with extension into T11 vertebra and possible involvement of the left T12 neural foramina.  Patient reports left shoulder pain.  Smoking half a pack a day not motivated with further questioning quitting Denies weight loss, hemoptysis, cough.  Chronic shortness of breath with exertion. He drinks alcohol daily. Lives with his girlfriend.  He has 5 adult kids  #Hepatitis C  # 6/1?2020  CT-guided biopsy of left-sided posterior mediastinal soft tissue adjacent to T10/11. Patient's case was discussed on tumor board. Consensus was reached that [facility] versus primary lung cancer with hepatoid adenocarcinoma features. Consensus was to proceed with liver biopsy first as liver mass was not FDG avid on PET scan.  Patient underwent liver biopsy and the present to discuss pathology reports.  ## 08/08/2018 Liver mass biopsy showed fragments of necrotic and calcified tissue with adjacent fibrous capsule Scant viable nonneoplastic liver tissue with steatosis, inflammation and non specific fibrosis.  # 08/28/2018 CT guided left upper lobe lung mass biopsy showed poorly differentiated adenocarcinoma of lung origin.  6/19-08/16/2018  Finished palliative radiation to paraspinal soft tissue mass.  INTERVAL HISTORY Samuel Hood. is a 68 y.o. male who has above history reviewed by me today presents for follow up visit for assessment of tolerability of chemotherapy. Patient was seen by symptom management nurse practitioner this week due to dizziness, lightheaded and was found to have hypotension.  Likely due to poor oral intake. Patient was given IV fluid  daily x2 with remarkable improvement.  Amlodipine was held due to hypotension. Lenvatinib has been held for 2 days. Patient reports feeling okay today.  Denies any  nausea, vomiting or diarrhea. He is getting radiation for lung mass. Denies any pain today.  Denies any lightheaded or dizziness today. Denies any fever or chills. Chronic shortness of breath at baseline.  No cough .  Review of Systems  Constitutional: Positive for fatigue. Negative for appetite change, chills, fever and unexpected weight change.  HENT:   Negative for hearing loss and voice change.   Eyes: Negative for eye problems and icterus.  Respiratory: Positive for shortness of breath. Negative for chest tightness and cough.   Cardiovascular: Negative for chest pain and leg swelling.  Gastrointestinal: Negative for abdominal distention, abdominal pain and blood in stool.  Endocrine: Negative for hot flashes.  Genitourinary: Negative for difficulty urinating, dysuria and frequency.   Musculoskeletal: Negative for arthralgias.  Skin: Negative for itching and rash.  Neurological: Negative for dizziness, extremity weakness, light-headedness and numbness.  Hematological: Negative for adenopathy. Does not bruise/bleed easily.  Psychiatric/Behavioral: Negative for confusion.    MEDICAL HISTORY:  Past Medical History:  Diagnosis Date  . Hepatocellular carcinoma metastatic to left lung ([place]) 07/24/2018  . History of angiography    left lower extremity  . Hypertension   . Small bowel obstruction ([place])     SURGICAL HISTORY: Past Surgical History:  Procedure Laterality Date  . COLON SURGERY    . COLONOSCOPY W/ POLYPECTOMY    . COLONOSCOPY WITH PROPOFOL N/A 01/31/2018   Procedure: COLONOSCOPY WITH PROPOFOL;  Surgeon: Toledo, Benay Pike, MD;  Location: [facility] ENDOSCOPY;  Service: Gastroenterology;  Laterality: N/A;  . debridement fasciotomy leg, left Left 03/19/2016  . LAPAROTOMY N/A 09/27/2014   Procedure: EXPLORATORY LAPAROTOMY;  Surgeon: Dia Crawford III, MD;  Location: [facility] ORS;  Service: General;  Laterality: N/A;    SOCIAL HISTORY: Social History   Socioeconomic History  .  Marital status: Single    Spouse name: Not on file  . Number of children: Not on file  . Years of education: Not on file  . Highest education level: Not on file  Occupational History  . Occupation: retired  Scientific laboratory technician  . Financial resource strain: Hard  . Food insecurity    Worry: Never true    Inability: Never true  . Transportation needs    Medical: No    Non-medical: No  Tobacco Use  . Smoking status: Former Smoker    Packs/day: 0.25    Years: 50.00    Pack years: 12.50  . Smokeless tobacco: Never Used  . Tobacco comment: 1-2/week  Substance and Sexual Activity  . Alcohol use: Yes    Alcohol/week: 35.0 standard drinks    Types: 30 Cans of beer, 5 Shots of liquor per week  . Drug use: No  . Sexual activity: Not on file  Lifestyle  . Physical activity    Days per week: 7 days    Minutes per session: 30 min  . Stress: Not at all  Relationships  . Social connections    Talks on phone: More than three times a week    Gets together: More than three times a week    Attends religious service: More than 4 times per year    Active member of club or organization: Not on file    Attends meetings of clubs or organizations: Not on file    Relationship status: Not on file  .  Intimate partner violence    Fear of current or ex partner: Patient refused    Emotionally abused: No    Physically abused: No    Forced sexual activity: No  Other Topics Concern  . Not on file  Social History Narrative  . Not on file    FAMILY HISTORY: Family History  Problem Relation Age of Onset  . Cancer Mother   . Cancer Father     ALLERGIES:  has No Known Allergies.  MEDICATIONS:  Current Outpatient Medications  Medication Sig Dispense Refill  . Acetaminophen (TYLENOL PO) Take by mouth.    . Lenvatinib 12 mg daily dose (LENVIMA) 3 x 4 MG capsule Take 12 mg by mouth daily. (Patient not taking: Reported on 10/05/2018) 90 capsule 0  . loperamide (IMODIUM) 2 MG capsule Take 1 capsule (2 mg  total) by mouth See admin instructions. With onset of loose stool, take 4mg  followed by 2mg  every 2 hours until 12 hours have passed without loose bowel movement. Maximum: 16 mg/day (Patient not taking: Reported on 10/05/2018) 60 capsule 1  . Multiple Vitamin (MULTIVITAMIN) capsule Take 1 capsule by mouth daily.     No current facility-administered medications for this visit.      PHYSICAL EXAMINATION: ECOG PERFORMANCE STATUS: 1 - Symptomatic but completely ambulatory Vitals:   10/05/18 1005  BP: 103/70  Pulse: (!) 53  Temp: 98.2 F (36.8 C)   Filed Weights   10/05/18 1005  Weight: 136 lb (61.7 kg)    Physical Exam Constitutional:      General: He is not in acute distress.    Comments: cachectic  HENT:     Head: Normocephalic and atraumatic.  Eyes:     General: No scleral icterus.    Pupils: Pupils are equal, round, and reactive to light.  Neck:     Musculoskeletal: Normal range of motion and neck supple.  Cardiovascular:     Rate and Rhythm: Normal rate and regular rhythm.     Heart sounds: Normal heart sounds.  Pulmonary:     Effort: Pulmonary effort is normal. No respiratory distress.     Breath sounds: No wheezing.     Comments: Decreased breath sound bilaterally. Abdominal:     General: Bowel sounds are normal. There is no distension.     Palpations: Abdomen is soft. There is no mass.     Tenderness: There is no abdominal tenderness.  Musculoskeletal: Normal range of motion.        General: No deformity.  Skin:    General: Skin is warm and dry.     Findings: No erythema or rash.  Neurological:     Mental Status: He is alert and oriented to person, place, and time.     Cranial Nerves: No cranial nerve deficit.     Coordination: Coordination normal.  Psychiatric:        Behavior: Behavior normal.        Thought Content: Thought content normal.     LABORATORY DATA:  I have reviewed the data as listed Lab Results  Component Value Date   WBC 5.0 10/05/2018    HGB 13.2 10/05/2018   HCT 39.0 10/05/2018   MCV 87.2 10/05/2018   PLT 401 (H) 10/05/2018   Recent Labs    09/22/18 0941 10/03/18 0951 10/05/18 0905  NA 133* 136 141  K 3.7 4.0 3.4*  CL 97* 100 106  CO2 27 24 27   GLUCOSE 125* 123* 93  BUN 8 10 7*  CREATININE 0.87 1.22 0.77  CALCIUM 9.1 9.0 9.0  GFRNONAA >60 >60 >60  GFRAA >60 >60 >60  PROT 8.5* 8.3* 7.3  ALBUMIN 3.3* 3.1* 2.9*  AST 37 36 32  ALT 31 27 24   ALKPHOS 72 76 72  BILITOT 1.0 0.6 0.5   Iron/TIBC/Ferritin/ %Sat No results found for: IRON, TIBC, FERRITIN, IRONPCTSAT   RADIOGRAPHIC STUDIES: I have personally reviewed the radiological images as listed and agreed with the findings in the report.  No results found.    ASSESSMENT & PLAN:  1. Hepatocellular carcinoma metastatic to left lung ([place])   2. Primary lung adenocarcinoma, left ([place])   3. Encounter for antineoplastic chemotherapy   4. Goals of care, counseling/discussion   5. Orthostatic hypotension    #HCC, currently on lenvatinib,  Imatinib was held due to hypotension.  Symptoms improved with supportive care. Advised patient to resume imatinib.  #Hypotension, today's blood pressure is normal.  Advised patient to continue hold amlodipine. Encourage oral hydration.  #Lung adenocarcinoma cT3 Nx Mx Continue follow-up with radiation oncology to finish SBRT to the lung lesion.  We spent sufficient time to discuss many aspect of care, questions were answered to patient's satisfaction.  Patient to follow-up with symptom management in 1 week Follow-up with me in 2 weeks.  Orders Placed This Encounter  Procedures  . CBC with Differential/Platelet    Standing Status:   Future    Standing Expiration Date:   10/05/2019  . Comprehensive metabolic panel    Standing Status:   Future    Standing Expiration Date:   10/05/2019  . AFP tumor marker    Standing Status:   Future    Standing Expiration Date:   10/05/2019  . CBC with Differential/Platelet     Standing Status:   Future    Standing Expiration Date:   10/05/2019  . Comprehensive metabolic panel    Standing Status:   Future    Standing Expiration Date:   10/05/2019    All questions were answered. The patient knows to call the clinic with any problems questions or concerns. Return of visit:  2 weeks.    Earlie Server, MD, PhD 10/05/2018

## 2018-10-06 ENCOUNTER — Encounter: Payer: Self-pay | Admitting: Oncology

## 2018-10-06 ENCOUNTER — Ambulatory Visit
Admission: RE | Admit: 2018-10-06 | Discharge: 2018-10-06 | Disposition: A | Discharge status: Home / Self Care | Payer: Medicare Other | Source: Ambulatory Visit | Attending: Radiation Oncology | Admitting: Radiation Oncology

## 2018-10-06 ENCOUNTER — Other Ambulatory Visit: Payer: Self-pay

## 2018-10-06 DIAGNOSIS — C7951 Secondary malignant neoplasm of bone: Secondary | ICD-10-CM | POA: Diagnosis not present

## 2018-10-06 DIAGNOSIS — C3412 Malignant neoplasm of upper lobe, left bronchus or lung: Secondary | ICD-10-CM | POA: Diagnosis not present

## 2018-10-06 DIAGNOSIS — Z510 Encounter for antineoplastic radiation therapy: Secondary | ICD-10-CM | POA: Diagnosis not present

## 2018-10-06 DIAGNOSIS — C220 Liver cell carcinoma: Secondary | ICD-10-CM | POA: Diagnosis not present

## 2018-10-06 DIAGNOSIS — C7802 Secondary malignant neoplasm of left lung: Secondary | ICD-10-CM | POA: Diagnosis not present

## 2018-10-06 DIAGNOSIS — Z87891 Personal history of nicotine dependence: Secondary | ICD-10-CM | POA: Diagnosis not present

## 2018-10-09 ENCOUNTER — Other Ambulatory Visit: Payer: Self-pay

## 2018-10-09 ENCOUNTER — Ambulatory Visit
Admission: RE | Admit: 2018-10-09 | Discharge: 2018-10-09 | Disposition: A | Discharge status: Home / Self Care | Payer: Medicare Other | Source: Ambulatory Visit | Attending: Radiation Oncology | Admitting: Radiation Oncology

## 2018-10-09 DIAGNOSIS — C7802 Secondary malignant neoplasm of left lung: Secondary | ICD-10-CM | POA: Diagnosis not present

## 2018-10-09 DIAGNOSIS — C3412 Malignant neoplasm of upper lobe, left bronchus or lung: Secondary | ICD-10-CM | POA: Diagnosis not present

## 2018-10-09 DIAGNOSIS — Z87891 Personal history of nicotine dependence: Secondary | ICD-10-CM | POA: Diagnosis not present

## 2018-10-09 DIAGNOSIS — Z510 Encounter for antineoplastic radiation therapy: Secondary | ICD-10-CM | POA: Diagnosis not present

## 2018-10-09 DIAGNOSIS — C220 Liver cell carcinoma: Secondary | ICD-10-CM | POA: Diagnosis not present

## 2018-10-09 DIAGNOSIS — C7951 Secondary malignant neoplasm of bone: Secondary | ICD-10-CM | POA: Diagnosis not present

## 2018-10-10 ENCOUNTER — Ambulatory Visit
Admission: RE | Admit: 2018-10-10 | Discharge: 2018-10-10 | Disposition: A | Discharge status: Home / Self Care | Payer: Medicare Other | Source: Ambulatory Visit | Attending: Radiation Oncology | Admitting: Radiation Oncology

## 2018-10-10 ENCOUNTER — Other Ambulatory Visit: Payer: Self-pay

## 2018-10-10 DIAGNOSIS — C7951 Secondary malignant neoplasm of bone: Secondary | ICD-10-CM | POA: Diagnosis not present

## 2018-10-10 DIAGNOSIS — C3412 Malignant neoplasm of upper lobe, left bronchus or lung: Secondary | ICD-10-CM | POA: Diagnosis not present

## 2018-10-10 DIAGNOSIS — C7802 Secondary malignant neoplasm of left lung: Secondary | ICD-10-CM | POA: Diagnosis not present

## 2018-10-10 DIAGNOSIS — Z510 Encounter for antineoplastic radiation therapy: Secondary | ICD-10-CM | POA: Diagnosis not present

## 2018-10-10 DIAGNOSIS — Z87891 Personal history of nicotine dependence: Secondary | ICD-10-CM | POA: Diagnosis not present

## 2018-10-10 DIAGNOSIS — C220 Liver cell carcinoma: Secondary | ICD-10-CM | POA: Diagnosis not present

## 2018-10-11 ENCOUNTER — Other Ambulatory Visit: Payer: Self-pay

## 2018-10-11 ENCOUNTER — Ambulatory Visit
Admission: RE | Admit: 2018-10-11 | Discharge: 2018-10-11 | Disposition: A | Discharge status: Home / Self Care | Payer: Medicare Other | Source: Ambulatory Visit | Attending: Radiation Oncology | Admitting: Radiation Oncology

## 2018-10-11 DIAGNOSIS — C220 Liver cell carcinoma: Secondary | ICD-10-CM | POA: Diagnosis not present

## 2018-10-11 DIAGNOSIS — C7951 Secondary malignant neoplasm of bone: Secondary | ICD-10-CM | POA: Diagnosis not present

## 2018-10-11 DIAGNOSIS — C7802 Secondary malignant neoplasm of left lung: Secondary | ICD-10-CM | POA: Diagnosis not present

## 2018-10-11 DIAGNOSIS — Z87891 Personal history of nicotine dependence: Secondary | ICD-10-CM | POA: Diagnosis not present

## 2018-10-11 DIAGNOSIS — C3412 Malignant neoplasm of upper lobe, left bronchus or lung: Secondary | ICD-10-CM | POA: Diagnosis not present

## 2018-10-11 DIAGNOSIS — Z510 Encounter for antineoplastic radiation therapy: Secondary | ICD-10-CM | POA: Diagnosis not present

## 2018-10-12 ENCOUNTER — Encounter: Payer: Self-pay | Admitting: Oncology

## 2018-10-12 ENCOUNTER — Other Ambulatory Visit: Payer: Self-pay

## 2018-10-12 ENCOUNTER — Inpatient Hospital Stay (HOSPITAL_BASED_OUTPATIENT_CLINIC_OR_DEPARTMENT_OTHER): Payer: Medicare Other | Admitting: Oncology

## 2018-10-12 ENCOUNTER — Ambulatory Visit: Payer: Medicare Other | Admitting: Oncology

## 2018-10-12 ENCOUNTER — Inpatient Hospital Stay: Payer: Medicare Other

## 2018-10-12 ENCOUNTER — Other Ambulatory Visit: Payer: Medicare Other

## 2018-10-12 ENCOUNTER — Ambulatory Visit
Admission: RE | Admit: 2018-10-12 | Discharge: 2018-10-12 | Disposition: A | Discharge status: Home / Self Care | Payer: Medicare Other | Source: Ambulatory Visit | Attending: Radiation Oncology | Admitting: Radiation Oncology

## 2018-10-12 VITALS — BP 102/81 | HR 79 | Temp 96.4°F | Resp 16 | Wt 133.0 lb

## 2018-10-12 DIAGNOSIS — R630 Anorexia: Secondary | ICD-10-CM | POA: Diagnosis not present

## 2018-10-12 DIAGNOSIS — I890 Lymphedema, not elsewhere classified: Secondary | ICD-10-CM | POA: Diagnosis not present

## 2018-10-12 DIAGNOSIS — R42 Dizziness and giddiness: Secondary | ICD-10-CM | POA: Diagnosis not present

## 2018-10-12 DIAGNOSIS — C220 Liver cell carcinoma: Secondary | ICD-10-CM | POA: Diagnosis not present

## 2018-10-12 DIAGNOSIS — K5900 Constipation, unspecified: Secondary | ICD-10-CM | POA: Diagnosis not present

## 2018-10-12 DIAGNOSIS — E860 Dehydration: Secondary | ICD-10-CM | POA: Diagnosis not present

## 2018-10-12 DIAGNOSIS — Z510 Encounter for antineoplastic radiation therapy: Secondary | ICD-10-CM | POA: Diagnosis not present

## 2018-10-12 DIAGNOSIS — J984 Other disorders of lung: Secondary | ICD-10-CM | POA: Diagnosis not present

## 2018-10-12 DIAGNOSIS — R634 Abnormal weight loss: Secondary | ICD-10-CM | POA: Diagnosis not present

## 2018-10-12 DIAGNOSIS — E871 Hypo-osmolality and hyponatremia: Secondary | ICD-10-CM | POA: Diagnosis not present

## 2018-10-12 DIAGNOSIS — I951 Orthostatic hypotension: Secondary | ICD-10-CM

## 2018-10-12 DIAGNOSIS — C7951 Secondary malignant neoplasm of bone: Secondary | ICD-10-CM | POA: Diagnosis not present

## 2018-10-12 DIAGNOSIS — Z87891 Personal history of nicotine dependence: Secondary | ICD-10-CM | POA: Diagnosis not present

## 2018-10-12 DIAGNOSIS — C7802 Secondary malignant neoplasm of left lung: Secondary | ICD-10-CM | POA: Diagnosis not present

## 2018-10-12 DIAGNOSIS — I712 Thoracic aortic aneurysm, without rupture: Secondary | ICD-10-CM | POA: Diagnosis not present

## 2018-10-12 DIAGNOSIS — R0602 Shortness of breath: Secondary | ICD-10-CM | POA: Diagnosis not present

## 2018-10-12 DIAGNOSIS — C3412 Malignant neoplasm of upper lobe, left bronchus or lung: Secondary | ICD-10-CM | POA: Diagnosis not present

## 2018-10-12 DIAGNOSIS — M25512 Pain in left shoulder: Secondary | ICD-10-CM | POA: Diagnosis not present

## 2018-10-12 DIAGNOSIS — J948 Other specified pleural conditions: Secondary | ICD-10-CM | POA: Diagnosis not present

## 2018-10-12 DIAGNOSIS — I10 Essential (primary) hypertension: Secondary | ICD-10-CM | POA: Diagnosis not present

## 2018-10-12 LAB — CBC WITH DIFFERENTIAL/PLATELET
Abs Immature Granulocytes: 0.02 10*3/uL (ref 0.00–0.07)
Basophils Absolute: 0 10*3/uL (ref 0.0–0.1)
Basophils Relative: 0 %
Eosinophils Absolute: 0.1 10*3/uL (ref 0.0–0.5)
Eosinophils Relative: 3 %
HCT: 40.7 % (ref 39.0–52.0)
Hemoglobin: 13.7 g/dL (ref 13.0–17.0)
Immature Granulocytes: 1 %
Lymphocytes Relative: 27 %
Lymphs Abs: 1 10*3/uL (ref 0.7–4.0)
MCH: 29.3 pg (ref 26.0–34.0)
MCHC: 33.7 g/dL (ref 30.0–36.0)
MCV: 87.2 fL (ref 80.0–100.0)
Monocytes Absolute: 0.6 10*3/uL (ref 0.1–1.0)
Monocytes Relative: 16 %
Neutro Abs: 1.9 10*3/uL (ref 1.7–7.7)
Neutrophils Relative %: 53 %
Platelets: 316 10*3/uL (ref 150–400)
RBC: 4.67 MIL/uL (ref 4.22–5.81)
RDW: 14.3 % (ref 11.5–15.5)
WBC: 3.6 10*3/uL — ABNORMAL LOW (ref 4.0–10.5)
nRBC: 0 % (ref 0.0–0.2)

## 2018-10-12 LAB — COMPREHENSIVE METABOLIC PANEL
ALT: 33 U/L (ref 0–44)
AST: 41 U/L (ref 15–41)
Albumin: 3.2 g/dL — ABNORMAL LOW (ref 3.5–5.0)
Alkaline Phosphatase: 87 U/L (ref 38–126)
Anion gap: 7 (ref 5–15)
BUN: 8 mg/dL (ref 8–23)
CO2: 27 mmol/L (ref 22–32)
Calcium: 9.3 mg/dL (ref 8.9–10.3)
Chloride: 105 mmol/L (ref 98–111)
Creatinine, Ser: 0.86 mg/dL (ref 0.61–1.24)
GFR calc Af Amer: >60 mL/min (ref >60)
GFR calc non Af Amer: >60 mL/min (ref >60)
Glucose, Bld: 90 mg/dL (ref 70–99)
Potassium: 3.8 mmol/L (ref 3.5–5.1)
Sodium: 139 mmol/L (ref 135–145)
Total Bilirubin: 0.5 mg/dL (ref 0.3–1.2)
Total Protein: 8.2 g/dL — ABNORMAL HIGH (ref 6.5–8.1)

## 2018-10-12 MED ORDER — POLYETHYLENE GLYCOL 3350 17 G PO PACK: 17.0000 g | PACK | Freq: Every day | ORAL | 0 refills | Status: DC

## 2018-10-12 MED ORDER — SENNA 8.6 MG PO TABS: 1.0000 | ORAL_TABLET | Freq: Every day | ORAL | 0 refills | Status: DC

## 2018-10-12 NOTE — Progress Notes (Signed)
Nutrition Assessment   Reason for Assessment:  Referral from Dr Tasia Catchings for weight loss   ASSESSMENT:  68 year old male with hepatocellular carcinoma metastatic to left lung.  Past medical history of HTN, alcohol abuse, smoking, emphysema, hep C. Patient on lenvatinib.    Spoke with patient in [facility] clinic.  Patient reports that his appetite is fair.  Reports that he normally eats cereal for breakfast.  Yesterday ate beef stew and rice for lunch but did not eat dinner due to not feeling hungry.  Reports feels full, constipation.  Reports small bowel movement this am and yesterday am.  Reports that he is drinking boost (unsure which one) about 2-3 times per day.  Also reports that he drinks water, gatorade, sprite.    Nutrition Focused Physical Exam: deferred   Medications: MVI   Labs: reviewed   Anthropometrics:   Height: 65 inches Weight: 133 lb today.   UBW: Noted 144-145 lb May-June 2020 BMI: 22  7% weight loss in the last 2 months, signficant   Estimated Energy Needs  Kcals: 1800-2100 Protein: 90-105 g Fluid: > 1.8 L   NUTRITION DIAGNOSIS: Inadequate oral intake related to cancer and cancer relate treatment side effects as evidenced by 7% weight loss in 2 months and poor po intake   INTERVENTION:  Discussed fluid goals with patient and encouraged adding foods with fiber to diet to improve constipation.   NP also planning to add medication to help with constipation. Discussed strategies to increase calories and protein Discussed high calorie shakes and samples provided.   Contact information provided   MONITORING, EVALUATION, GOAL: Patient will consume adequate calories and protein to prevent weight from decreasing.    Next Visit: Thursday, Sept 17th, phone call  Samuel Hood, [place], [place] Registered Dietitian [phone] (pager)

## 2018-10-12 NOTE — Patient Instructions (Signed)
Today you were seen for constipation.  I recommend you start by taking MiraLAX once a day and Senokot once a day.  I have sent a prescription for both of those to your pharmacy.  These are also available over-the-counter if insurance does not cover them.  Please start taking these today.  I also would encourage you to drink more fluids.  If you develop diarrhea or loose stools we will adjust your medications.  Typically I would take away the Senokot daily and continue with the MiraLAX.  I will call you daily to see how you are doing.  Please call with any concerns.   Constipation, Adult Constipation is when a person:  Poops (has a bowel movement) fewer times in a week than normal.  Has a hard time pooping.  Has poop that is dry, hard, or bigger than normal. Follow these instructions at home: Eating and drinking   Eat foods that have a lot of fiber, such as: ? Fresh fruits and vegetables. ? Whole grains. ? Beans.  Eat less of foods that are high in fat, low in fiber, or overly processed, such as: ? [place] fries. ? Hamburgers. ? Cookies. ? Candy. ? Soda.  Drink enough fluid to keep your pee (urine) clear or pale yellow. General instructions  Exercise regularly or as told by your doctor.  Go to the restroom when you feel like you need to poop. Do not hold it in.  Take over-the-counter and prescription medicines only as told by your doctor. These include any fiber supplements.  Do pelvic floor retraining exercises, such as: ? Doing deep breathing while relaxing your lower belly (abdomen). ? Relaxing your pelvic floor while pooping.  Watch your condition for any changes.  Keep all follow-up visits as told by your doctor. This is important. Contact a doctor if:  You have pain that gets worse.  You have a fever.  You have not pooped for 4 days.  You throw up (vomit).  You are not hungry.  You lose weight.  You are bleeding from the anus.  You have thin,  pencil-like poop (stool). Get help right away if:  You have a fever, and your symptoms suddenly get worse.  You leak poop or have blood in your poop.  Your belly feels hard or bigger than normal (is bloated).  You have very bad belly pain.  You feel dizzy or you faint. This information is not intended to replace advice given to you by your health care provider. Make sure you discuss any questions you have with your health care provider. Document Released: 07/21/2007 Document Revised: 01/14/2017 Document Reviewed: 07/23/2015 Elsevier Patient Education  2020 [employer].

## 2018-10-12 NOTE — Progress Notes (Signed)
Symptom Management Consult note [facility]  [phone] [phone]  Patient Care Team: Paulene Floor as PCP - General (Physician Assistant) Telford Nab, RN as Registered Nurse Clent Jacks, RN as Registered Nurse   Name of the patient: Samuel Hood  [id number]  1950/07/09   Date of visit: 10/12/2018   Diagnosis-lung cancer  Chief complaint/ Reason for visit- constipation  Heme/Onc history:  Oncology History Overview Note  Samuel Hood. is a  68 y.o.  male with PMH listed below was seen in consultation at the request of  Terrilee Croak, Adriana M, PA-C  for evaluation of lung nodule in the liver lesion. Patient has a past medical history of hypertension, alcohol abuse, smoking emphysema.  He has had serial CTs done in the past.  CT images were independently reviewed by me. 07/26/2017 CT chest with contrast showed extensive pleural-parenchymal scarring within the left upper lobe with several areas of soft tissue nodularity measuring up to 1.6 cm.  In this patient who is at increased risk of lung cancer further investigation with PET scan is recommended. Small chronic appearance loculated hydropneumothorax overlies the left apex. Slowly enlarging lesion within the central portion of the right lobe of liver identified. Per note, patient supposed to have additional work-up done in December repeat CT scan which he did not   Patient was recently seen by primary care provider and has subsequent image done for follow-up. CT on 06/30/2018 showed multiple significantly enlarged paraspinal mass are noted concerning.  The largest measures 3.8 x 0.5 cm and there appears to be a lytic destruction of the adjacent T11 vertebral body.  Also noted is new solid density measuring 17 x 12 mm in the pleural parenchymal density in the left upper lobe noted on prior exam.  Concerning for malignancy.  4.4 ascending thoracic aortic aneurysm.  Recommend annual  imaging.   Patient had PET scan done on 07/06/2018 Which showed there are 2 FDG avid lesion within the left upper lobe worrisome for primary lung neoplasm.  There is evidence of chest wall involvement.  Metastasis to the posterior mediastinum is identified with extension into T11 vertebra and possible involvement of the left T12 neural foramina.   Patient reports left shoulder pain.  Smoking half a pack a day not motivated with further questioning quitting Denies weight loss, hemoptysis, cough.  Chronic shortness of breath with exertion. He drinks alcohol daily. Lives with his girlfriend.  He has 5 adult kids   #Hepatitis C   # 6/1?2020  CT-guided biopsy of left-sided posterior mediastinal soft tissue adjacent to T10/11. Patient's case was discussed on tumor board. Consensus was reached that [facility] versus primary lung cancer with hepatoid adenocarcinoma features. Consensus was to proceed with liver biopsy first as liver mass was not FDG avid on PET scan.  Patient underwent liver biopsy and the present to discuss pathology reports.   ## 08/08/2018 Liver mass biopsy showed fragments of necrotic and calcified tissue with adjacent fibrous capsule Scant viable nonneoplastic liver tissue with steatosis, inflammation and non specific fibrosis.  # 08/28/2018 CT guided left upper lobe lung mass biopsy showed poorly differentiated adenocarcinoma of lung origin.  6/19-08/16/2018  Finished palliative radiation to paraspinal soft tissue mass.   Hepatocellular carcinoma metastatic to left lung ([place])  07/24/2018 Initial Diagnosis   Hepatocellular carcinoma metastatic to left lung [facility])    Interval history-  Constipation:  [name] is a 68 year old male who presents to [facility] for  reports of constipation. Stool pattern has been 1 firm and hard stool(s) per day.  Onset was 5 days ago  Defecation has been difficult and painful and causing intermittent abdominal pain.  Abdominal pain relieved with Tylenol.  He also  complains of early satiety.  Co-Morbid conditions:dietary change lack of appetite d/t presence of cancer on active treatment and recent dehydration/illness.  Symptoms have been gradually worsening.  Current Health Habits: Eating fiber? no  Exercise?no Water intake? yes, amt unknown   He is currently not on a bowel regimen.  He is not taking narcotics.  Complains of decreased appetite.  He is attempting to push fluids and is drinking supplements including boost/Ensure and Gatorade.  He tries to eat cereal every morning.  He has been evaluated in [facility] on 10/03/2018 for dizziness and hypotension.  His lenvatinib and amlodipine were held.  He was given IV fluids in clinic and provided with samples of boost and Ensure.  He was referred to home health and Gwenette Greet and PT OT for evaluation.  He met with OT on 10/04/2018 and found to be at low risk for falling or injury.  He did not require any additional follow-up at this time.  He also met with palliative care on 10/04/2018 and additional IV fluids were given and he was instructed to continue to hold lapatinib and amlodipine.  He remained hypotensive.  He was seen by Dr. Tasia Catchings on 10/05/2018 where hypotension improved and he was restarted on lenvatinib.  He continued to hold his amlodipine.  He was encouraged to continue follow-up with radiation oncology to finish SBRT.  He is scheduled to complete SBRT tomorrow.  He is also scheduled to meet with our dietitian today for further recommendations on weight loss and lack of appetite.   ECOG FS:1 - Symptomatic but completely ambulatory  Review of systems- Review of Systems  Constitutional: Positive for malaise/fatigue and weight loss. Negative for chills and fever.       Lack of appetite  HENT: Negative for congestion, ear pain and tinnitus.   Eyes: Negative.  Negative for blurred vision and double vision.  Respiratory: Positive for shortness of breath (With exertion). Negative for cough and sputum production.     Cardiovascular: Negative.  Negative for chest pain, palpitations and leg swelling.  Gastrointestinal: Positive for constipation. Negative for abdominal pain, diarrhea, nausea and vomiting.  Genitourinary: Negative for dysuria, frequency and urgency.  Musculoskeletal: Negative for back pain and falls.  Skin: Negative.  Negative for rash.  Neurological: Positive for weakness. Negative for headaches.  Endo/Heme/Allergies: Negative.  Does not bruise/bleed easily.  Psychiatric/Behavioral: Negative.  Negative for depression. The patient is not nervous/anxious and does not have insomnia.      Current treatment-lenvatinib + SBRT.   No Known Allergies   Past Medical History:  Diagnosis Date   Hepatocellular carcinoma metastatic to left lung ([place]) 07/24/2018   History of angiography    left lower extremity   Hypertension    Small bowel obstruction [facility])      Past Surgical History:  Procedure Laterality Date   COLON SURGERY     COLONOSCOPY W/ POLYPECTOMY     COLONOSCOPY WITH PROPOFOL N/A 01/31/2018   Procedure: COLONOSCOPY WITH PROPOFOL;  Surgeon: Toledo, Benay Pike, MD;  Location: [facility] ENDOSCOPY;  Service: Gastroenterology;  Laterality: N/A;   debridement fasciotomy leg, left Left 03/19/2016   LAPAROTOMY N/A 09/27/2014   Procedure: EXPLORATORY LAPAROTOMY;  Surgeon: Dia Crawford III, MD;  Location: [facility] ORS;  Service: General;  Laterality: N/A;    Social History   Socioeconomic History   Marital status: Single    Spouse name: Not on file   Number of children: Not on file   Years of education: Not on file   Highest education level: Not on file  Occupational History   Occupation: retired  Scientist, product/process development strain: Hard   Food insecurity    Worry: Never true    Inability: Never true   Transportation needs    Medical: No    Non-medical: No  Tobacco Use   Smoking status: Former Smoker    Packs/day: 0.25    Years: 50.00    Pack years: 12.50    Smokeless tobacco: Never Used   Tobacco comment: 1-2/week  Substance and Sexual Activity   Alcohol use: Yes    Alcohol/week: 35.0 standard drinks    Types: 30 Cans of beer, 5 Shots of liquor per week   Drug use: No   Sexual activity: Not on file  Lifestyle   Physical activity    Days per week: 7 days    Minutes per session: 30 min   Stress: Not at all  Relationships   Social connections    Talks on phone: More than three times a week    Gets together: More than three times a week    Attends religious service: More than 4 times per year    Active member of club or organization: Not on file    Attends meetings of clubs or organizations: Not on file    Relationship status: Not on file   Intimate partner violence    Fear of current or ex partner: Patient refused    Emotionally abused: No    Physically abused: No    Forced sexual activity: No  Other Topics Concern   Not on file  Social History Narrative   Not on file    Family History  Problem Relation Age of Onset   Cancer Mother    Cancer Father      Current Outpatient Medications:    Acetaminophen (TYLENOL PO), Take by mouth., Disp: , Rfl:    Lenvatinib 12 mg daily dose (LENVIMA) 3 x 4 MG capsule, Take 12 mg by mouth daily., Disp: 90 capsule, Rfl: 0   Multiple Vitamin (MULTIVITAMIN) capsule, Take 1 capsule by mouth daily., Disp: , Rfl:    loperamide (IMODIUM) 2 MG capsule, Take 1 capsule (2 mg total) by mouth See admin instructions. With onset of loose stool, take '4mg'$  followed by '2mg'$  every 2 hours until 12 hours have passed without loose bowel movement. Maximum: 16 mg/day (Patient not taking: Reported on 10/05/2018), Disp: 60 capsule, Rfl: 1   polyethylene glycol (MIRALAX) 17 g packet, Take 17 g by mouth daily., Disp: 21 each, Rfl: 0   senna (SENOKOT) 8.6 MG TABS tablet, Take 1 tablet (8.6 mg total) by mouth daily., Disp: 120 tablet, Rfl: 0  Physical exam:  Vitals:   10/12/18 1011  BP: 102/81  Pulse:  79  Resp: 16  Temp: (!) 96.4 F (35.8 C)  TempSrc: Tympanic  Weight: 133 lb (60.3 kg)   Physical Exam Constitutional:      Appearance: Normal appearance.  HENT:     Head: Normocephalic and atraumatic.  Eyes:     Pupils: Pupils are equal, round, and reactive to light.  Neck:     Musculoskeletal: Normal range of motion.  Cardiovascular:     Rate and Rhythm: Normal rate and  regular rhythm.     Heart sounds: Normal heart sounds. No murmur.  Pulmonary:     Effort: Pulmonary effort is normal.     Breath sounds: Normal breath sounds. No wheezing.  Abdominal:     General: Bowel sounds are normal. There is no distension.     Palpations: Abdomen is soft.     Tenderness: There is no abdominal tenderness.  Musculoskeletal: Normal range of motion.  Skin:    General: Skin is warm and dry.     Findings: No rash.  Neurological:     Mental Status: He is alert and oriented to person, place, and time.  Psychiatric:        Judgment: Judgment normal.      CMP Latest Ref Rng & Units 10/12/2018  Glucose 70 - 99 mg/dL 90  BUN 8 - 23 mg/dL 8  Creatinine 0.61 - 1.24 mg/dL 0.86  Sodium 135 - 145 mmol/L 139  Potassium 3.5 - 5.1 mmol/L 3.8  Chloride 98 - 111 mmol/L 105  CO2 22 - 32 mmol/L 27  Calcium 8.9 - 10.3 mg/dL 9.3  Total Protein 6.5 - 8.1 g/dL 8.2(H)  Total Bilirubin 0.3 - 1.2 mg/dL 0.5  Alkaline Phos 38 - 126 U/L 87  AST 15 - 41 U/L 41  ALT 0 - 44 U/L 33   CBC Latest Ref Rng & Units 10/12/2018  WBC 4.0 - 10.5 K/uL 3.6(L)  Hemoglobin 13.0 - 17.0 g/dL 13.7  Hematocrit 39.0 - 52.0 % 40.7  Platelets 150 - 400 K/uL 316    No images are attached to the encounter.  No results found.   Assessment and plan- Patient is a 68 y.o. male who presents to [facility] for complaints of constipation x5 days.   Hepatocellular carcinoma: Currently stable on lenvatinib.  Also receiving SBRT to metastatic lung lesion.  Final treatment is tomorrow.  Constipation: Likley d/t lenvatinib (16-29%) and  dehydration/decreased oral intake.  Intermittent abdominal pain.  Not taking narcotics.  Currently not on bowel regimen. We discussed trying to stabilize/normalize his BMs as his bowels have recently become hard/firm; previously experienced diarrhea.  Would recommend MiraLAX +/- senna for treatment/management of constipation.  Second line would be Linzess +/- enema.   Recommend plenty of fluids that will hopefully reduce effects of constipation and lessen spasm pain.  We discussed the foods that are high in insoluble fiber such as fruits and vegetables can be helpful when experiencing constipation and also discussed the importance of staying well-hydrated.   Plan: Stat labs.  Unremarkable Stat vital signs.  Soft but appears to be baseline. Increase fluid intake. Diet rich in fiber. Continue supplements.  RX MiraLAX daily. Rx Senokot daily.  Disposition: Start taking MiraLAX and Senokot daily.  Prescription sent to pharmacy. We will check on patient beginning next week to see if bowel movements have stabilized.  Patient instructed to stop Senokot if he develops diarrhea or frequent bowel movements. RTC as scheduled to see Dr. Tasia Catchings on 10/19/2018. RTC tomorrow for final radiation treatment.  Visit Diagnosis 1. Constipation, unspecified constipation type     Patient expressed understanding and was in agreement with this plan. He also understands that He can call clinic at any time with any questions, concerns, or complaints.   Greater than 50% was spent in counseling and coordination of care with this patient including but not limited to discussion of the relevant topics above (See A&P) including, but not limited to diagnosis and management of acute and chronic medical conditions.  Thank you for allowing me to participate in the care of this very pleasant patient.    Jacquelin Hawking, NP [place] at [facility] Cell - [phone] Pager- [phone] 10/12/2018 12:50 PM

## 2018-10-13 ENCOUNTER — Other Ambulatory Visit: Payer: Self-pay

## 2018-10-13 ENCOUNTER — Ambulatory Visit
Admission: RE | Admit: 2018-10-13 | Discharge: 2018-10-13 | Disposition: A | Discharge status: Home / Self Care | Payer: Medicare Other | Source: Ambulatory Visit | Attending: Radiation Oncology | Admitting: Radiation Oncology

## 2018-10-13 DIAGNOSIS — Z87891 Personal history of nicotine dependence: Secondary | ICD-10-CM | POA: Diagnosis not present

## 2018-10-13 DIAGNOSIS — C3412 Malignant neoplasm of upper lobe, left bronchus or lung: Secondary | ICD-10-CM | POA: Diagnosis not present

## 2018-10-13 DIAGNOSIS — Z510 Encounter for antineoplastic radiation therapy: Secondary | ICD-10-CM | POA: Diagnosis not present

## 2018-10-13 DIAGNOSIS — C7802 Secondary malignant neoplasm of left lung: Secondary | ICD-10-CM | POA: Diagnosis not present

## 2018-10-13 DIAGNOSIS — C7951 Secondary malignant neoplasm of bone: Secondary | ICD-10-CM | POA: Diagnosis not present

## 2018-10-13 DIAGNOSIS — C220 Liver cell carcinoma: Secondary | ICD-10-CM | POA: Diagnosis not present

## 2018-10-18 NOTE — Progress Notes (Deleted)
Tried calling patient for pre charting but he did not answer and unable to leave message

## 2018-10-19 ENCOUNTER — Inpatient Hospital Stay: Payer: Medicare Other | Attending: Oncology

## 2018-10-19 ENCOUNTER — Inpatient Hospital Stay: Payer: Medicare Other | Admitting: Oncology

## 2018-10-19 DIAGNOSIS — Z809 Family history of malignant neoplasm, unspecified: Secondary | ICD-10-CM | POA: Insufficient documentation

## 2018-10-19 DIAGNOSIS — Z87891 Personal history of nicotine dependence: Secondary | ICD-10-CM | POA: Insufficient documentation

## 2018-10-19 DIAGNOSIS — I712 Thoracic aortic aneurysm, without rupture: Secondary | ICD-10-CM | POA: Insufficient documentation

## 2018-10-19 DIAGNOSIS — J439 Emphysema, unspecified: Secondary | ICD-10-CM | POA: Insufficient documentation

## 2018-10-19 DIAGNOSIS — J984 Other disorders of lung: Secondary | ICD-10-CM | POA: Insufficient documentation

## 2018-10-19 DIAGNOSIS — Z7289 Other problems related to lifestyle: Secondary | ICD-10-CM | POA: Insufficient documentation

## 2018-10-19 DIAGNOSIS — Z79899 Other long term (current) drug therapy: Secondary | ICD-10-CM | POA: Insufficient documentation

## 2018-10-19 DIAGNOSIS — C220 Liver cell carcinoma: Secondary | ICD-10-CM | POA: Insufficient documentation

## 2018-10-19 DIAGNOSIS — F1010 Alcohol abuse, uncomplicated: Secondary | ICD-10-CM | POA: Insufficient documentation

## 2018-10-19 DIAGNOSIS — C7802 Secondary malignant neoplasm of left lung: Secondary | ICD-10-CM | POA: Insufficient documentation

## 2018-10-19 DIAGNOSIS — F17210 Nicotine dependence, cigarettes, uncomplicated: Secondary | ICD-10-CM | POA: Insufficient documentation

## 2018-10-19 DIAGNOSIS — R0602 Shortness of breath: Secondary | ICD-10-CM | POA: Insufficient documentation

## 2018-10-19 DIAGNOSIS — I10 Essential (primary) hypertension: Secondary | ICD-10-CM | POA: Insufficient documentation

## 2018-10-19 DIAGNOSIS — R5383 Other fatigue: Secondary | ICD-10-CM | POA: Insufficient documentation

## 2018-10-19 DIAGNOSIS — M25512 Pain in left shoulder: Secondary | ICD-10-CM | POA: Insufficient documentation

## 2018-10-19 DIAGNOSIS — B1920 Unspecified viral hepatitis C without hepatic coma: Secondary | ICD-10-CM | POA: Insufficient documentation

## 2018-10-19 DIAGNOSIS — J948 Other specified pleural conditions: Secondary | ICD-10-CM | POA: Insufficient documentation

## 2018-10-24 ENCOUNTER — Other Ambulatory Visit: Payer: Self-pay

## 2018-10-25 ENCOUNTER — Other Ambulatory Visit: Payer: Self-pay

## 2018-10-25 ENCOUNTER — Encounter: Payer: Self-pay | Admitting: Oncology

## 2018-10-25 ENCOUNTER — Inpatient Hospital Stay (HOSPITAL_BASED_OUTPATIENT_CLINIC_OR_DEPARTMENT_OTHER): Payer: Medicare Other | Admitting: Oncology

## 2018-10-25 ENCOUNTER — Inpatient Hospital Stay: Payer: Medicare Other | Attending: Oncology

## 2018-10-25 VITALS — BP 116/84 | HR 88 | Temp 96.9°F | Resp 16 | Wt 133.6 lb

## 2018-10-25 DIAGNOSIS — C3492 Malignant neoplasm of unspecified part of left bronchus or lung: Secondary | ICD-10-CM | POA: Diagnosis not present

## 2018-10-25 DIAGNOSIS — E860 Dehydration: Secondary | ICD-10-CM | POA: Insufficient documentation

## 2018-10-25 DIAGNOSIS — C7802 Secondary malignant neoplasm of left lung: Secondary | ICD-10-CM | POA: Diagnosis not present

## 2018-10-25 DIAGNOSIS — Z87891 Personal history of nicotine dependence: Secondary | ICD-10-CM | POA: Insufficient documentation

## 2018-10-25 DIAGNOSIS — I712 Thoracic aortic aneurysm, without rupture: Secondary | ICD-10-CM | POA: Diagnosis not present

## 2018-10-25 DIAGNOSIS — Z5111 Encounter for antineoplastic chemotherapy: Secondary | ICD-10-CM

## 2018-10-25 DIAGNOSIS — R5383 Other fatigue: Secondary | ICD-10-CM | POA: Diagnosis not present

## 2018-10-25 DIAGNOSIS — R0602 Shortness of breath: Secondary | ICD-10-CM | POA: Diagnosis not present

## 2018-10-25 DIAGNOSIS — C220 Liver cell carcinoma: Secondary | ICD-10-CM

## 2018-10-25 DIAGNOSIS — J948 Other specified pleural conditions: Secondary | ICD-10-CM | POA: Diagnosis not present

## 2018-10-25 DIAGNOSIS — B1920 Unspecified viral hepatitis C without hepatic coma: Secondary | ICD-10-CM | POA: Diagnosis not present

## 2018-10-25 DIAGNOSIS — Z79899 Other long term (current) drug therapy: Secondary | ICD-10-CM | POA: Diagnosis not present

## 2018-10-25 DIAGNOSIS — M25512 Pain in left shoulder: Secondary | ICD-10-CM | POA: Insufficient documentation

## 2018-10-25 DIAGNOSIS — I951 Orthostatic hypotension: Secondary | ICD-10-CM

## 2018-10-25 DIAGNOSIS — Z7189 Other specified counseling: Secondary | ICD-10-CM

## 2018-10-25 LAB — COMPREHENSIVE METABOLIC PANEL
ALT: 48 U/L — ABNORMAL HIGH (ref 0–44)
AST: 51 U/L — ABNORMAL HIGH (ref 15–41)
Albumin: 3.5 g/dL (ref 3.5–5.0)
Alkaline Phosphatase: 87 U/L (ref 38–126)
Anion gap: 10 (ref 5–15)
BUN: 8 mg/dL (ref 8–23)
CO2: 28 mmol/L (ref 22–32)
Calcium: 9.6 mg/dL (ref 8.9–10.3)
Chloride: 102 mmol/L (ref 98–111)
Creatinine, Ser: 0.69 mg/dL (ref 0.61–1.24)
GFR calc Af Amer: >60 mL/min (ref >60)
GFR calc non Af Amer: >60 mL/min (ref >60)
Glucose, Bld: 120 mg/dL — ABNORMAL HIGH (ref 70–99)
Potassium: 3.5 mmol/L (ref 3.5–5.1)
Sodium: 140 mmol/L (ref 135–145)
Total Bilirubin: 0.6 mg/dL (ref 0.3–1.2)
Total Protein: 8.1 g/dL (ref 6.5–8.1)

## 2018-10-25 LAB — CBC WITH DIFFERENTIAL/PLATELET
Abs Immature Granulocytes: 0.01 10*3/uL (ref 0.00–0.07)
Basophils Absolute: 0 10*3/uL (ref 0.0–0.1)
Basophils Relative: 0 %
Eosinophils Absolute: 0.1 10*3/uL (ref 0.0–0.5)
Eosinophils Relative: 2 %
HCT: 44 % (ref 39.0–52.0)
Hemoglobin: 14.6 g/dL (ref 13.0–17.0)
Immature Granulocytes: 0 %
Lymphocytes Relative: 23 %
Lymphs Abs: 0.8 10*3/uL (ref 0.7–4.0)
MCH: 29 pg (ref 26.0–34.0)
MCHC: 33.2 g/dL (ref 30.0–36.0)
MCV: 87.5 fL (ref 80.0–100.0)
Monocytes Absolute: 0.5 10*3/uL (ref 0.1–1.0)
Monocytes Relative: 15 %
Neutro Abs: 2.1 10*3/uL (ref 1.7–7.7)
Neutrophils Relative %: 60 %
Platelets: 274 10*3/uL (ref 150–400)
RBC: 5.03 MIL/uL (ref 4.22–5.81)
RDW: 15.6 % — ABNORMAL HIGH (ref 11.5–15.5)
WBC: 3.4 10*3/uL — ABNORMAL LOW (ref 4.0–10.5)
nRBC: 0 % (ref 0.0–0.2)

## 2018-10-25 NOTE — Progress Notes (Signed)
Patient reports he is "sore" and thinks it is due to him not doing his routine walks.  He has finished XRT.

## 2018-10-26 DIAGNOSIS — I959 Hypotension, unspecified: Secondary | ICD-10-CM | POA: Insufficient documentation

## 2018-10-26 DIAGNOSIS — I951 Orthostatic hypotension: Secondary | ICD-10-CM | POA: Insufficient documentation

## 2018-10-26 LAB — AFP TUMOR MARKER: AFP, Serum, Tumor Marker: 41.9 ng/mL — ABNORMAL HIGH (ref 0.0–8.3)

## 2018-10-26 NOTE — Progress Notes (Signed)
Hematology/Oncology follow up note [facility] [phone] [phone]   Patient Care Team: Paulene Floor as PCP - General (Physician Assistant) Telford Nab, RN as Registered Nurse Clent Jacks, RN as Registered Nurse  REFERRING PROVIDER: Trinna Post, PA-C  CHIEF COMPLAINTS/REASON FOR VISIT:  Discussion of metastatic cancer management.  HISTORY OF PRESENTING ILLNESS:   Samuel Tony. is a  68 y.o.  male with PMH listed below was seen in consultation at the request of  Terrilee Croak, Adriana M, PA-C  for evaluation of lung nodule in the liver lesion. Patient has a past medical history of hypertension, alcohol abuse, smoking emphysema.  He has had serial CTs done in the past.  CT images were independently reviewed by me. 07/26/2017 CT chest with contrast showed extensive pleural-parenchymal scarring within the left upper lobe with several areas of soft tissue nodularity measuring up to 1.6 cm.  In this patient who is at increased risk of lung cancer further investigation with PET scan is recommended. Small chronic appearance loculated hydropneumothorax overlies the left apex. Slowly enlarging lesion within the central portion of the right lobe of liver identified. Per note, patient supposed to have additional work-up done in December repeat CT scan which he did not  Patient was recently seen by primary care provider and has subsequent image done for follow-up. CT on 06/30/2018 showed multiple significantly enlarged paraspinal mass are noted concerning.  The largest measures 3.8 x 0.5 cm and there appears to be a lytic destruction of the adjacent T11 vertebral body.  Also noted is new solid density measuring 17 x 12 mm in the pleural parenchymal density in the left upper lobe noted on prior exam.  Concerning for malignancy.  4.4 ascending thoracic aortic aneurysm.  Recommend annual imaging.  Patient had PET scan done on 07/06/2018  Which showed there are 2 FDG avid lesion within the left upper lobe worrisome for primary lung neoplasm.  There is evidence of chest wall involvement.  Metastasis to the posterior mediastinum is identified with extension into T11 vertebra and possible involvement of the left T12 neural foramina.  Patient reports left shoulder pain.  Smoking half a pack a day not motivated with further questioning quitting Denies weight loss, hemoptysis, cough.  Chronic shortness of breath with exertion. He drinks alcohol daily. Lives with his girlfriend.  He has 5 adult kids  #Hepatitis C  # 6/1?2020  CT-guided biopsy of left-sided posterior mediastinal soft tissue adjacent to T10/11. Patient's case was discussed on tumor board. Consensus was reached that [facility] versus primary lung cancer with hepatoid adenocarcinoma features. Consensus was to proceed with liver biopsy first as liver mass was not FDG avid on PET scan.  Patient underwent liver biopsy and the present to discuss pathology reports.  ## 08/08/2018 Liver mass biopsy showed fragments of necrotic and calcified tissue with adjacent fibrous capsule Scant viable nonneoplastic liver tissue with steatosis, inflammation and non specific fibrosis.  # 08/28/2018 CT guided left upper lobe lung mass biopsy showed poorly differentiated adenocarcinoma of lung origin.  6/19-08/16/2018  Finished palliative radiation to paraspinal soft tissue mass. 09/07/2018 patient was started on lenvatinib 12 mg daily. Status post SBRT to lung lesion  INTERVAL HISTORY Samuel Hood. is a 68 y.o. male who has above history reviewed by me today presents for follow up visit for assessment of tolerability of chemotherapy. Patient reports feeling well at baseline. During the interval, patient has developed orthostatic hypotension which was felt  to be secondary to dehydration. He has received IV fluid sessions. Today he denies any dizziness, lightheaded, chest pain, fever, chills,  abdominal pain, shortness of breath.  Denies any nausea or vomiting.  No diarrhea. Blood pressure is 116/84 in the clinic.  Heart rate 88. Patient is getting radiation of lung mass.   .  Review of Systems  Constitutional: Positive for fatigue. Negative for appetite change, chills, fever and unexpected weight change.  HENT:   Negative for hearing loss and voice change.   Eyes: Negative for eye problems and icterus.  Respiratory: Positive for shortness of breath. Negative for chest tightness and cough.   Cardiovascular: Negative for chest pain and leg swelling.  Gastrointestinal: Negative for abdominal distention, abdominal pain and blood in stool.  Endocrine: Negative for hot flashes.  Genitourinary: Negative for difficulty urinating, dysuria and frequency.   Musculoskeletal: Negative for arthralgias.  Skin: Negative for itching and rash.  Neurological: Negative for dizziness, extremity weakness, light-headedness and numbness.  Hematological: Negative for adenopathy. Does not bruise/bleed easily.  Psychiatric/Behavioral: Negative for confusion.    MEDICAL HISTORY:  Past Medical History:  Diagnosis Date  . Hepatocellular carcinoma metastatic to left lung ([place]) 07/24/2018  . History of angiography    left lower extremity  . Hypertension   . Small bowel obstruction ([place])     SURGICAL HISTORY: Past Surgical History:  Procedure Laterality Date  . COLON SURGERY    . COLONOSCOPY W/ POLYPECTOMY    . COLONOSCOPY WITH PROPOFOL N/A 01/31/2018   Procedure: COLONOSCOPY WITH PROPOFOL;  Surgeon: Toledo, Benay Pike, MD;  Location: [facility] ENDOSCOPY;  Service: Gastroenterology;  Laterality: N/A;  . debridement fasciotomy leg, left Left 03/19/2016  . LAPAROTOMY N/A 09/27/2014   Procedure: EXPLORATORY LAPAROTOMY;  Surgeon: Dia Crawford III, MD;  Location: [facility] ORS;  Service: General;  Laterality: N/A;    SOCIAL HISTORY: Social History   Socioeconomic History  . Marital status: Single    Spouse  name: Not on file  . Number of children: Not on file  . Years of education: Not on file  . Highest education level: Not on file  Occupational History  . Occupation: retired  Scientific laboratory technician  . Financial resource strain: Hard  . Food insecurity    Worry: Never true    Inability: Never true  . Transportation needs    Medical: No    Non-medical: No  Tobacco Use  . Smoking status: Former Smoker    Packs/day: 0.25    Years: 50.00    Pack years: 12.50  . Smokeless tobacco: Never Used  . Tobacco comment: 1-2/week  Substance and Sexual Activity  . Alcohol use: Yes    Alcohol/week: 35.0 standard drinks    Types: 30 Cans of beer, 5 Shots of liquor per week  . Drug use: No  . Sexual activity: Not on file  Lifestyle  . Physical activity    Days per week: 7 days    Minutes per session: 30 min  . Stress: Not at all  Relationships  . Social connections    Talks on phone: More than three times a week    Gets together: More than three times a week    Attends religious service: More than 4 times per year    Active member of club or organization: Not on file    Attends meetings of clubs or organizations: Not on file    Relationship status: Not on file  . Intimate partner violence    Fear of  current or ex partner: Patient refused    Emotionally abused: No    Physically abused: No    Forced sexual activity: No  Other Topics Concern  . Not on file  Social History Narrative  . Not on file    FAMILY HISTORY: Family History  Problem Relation Age of Onset  . Cancer Mother   . Cancer Father     ALLERGIES:  has No Known Allergies.  MEDICATIONS:  Current Outpatient Medications  Medication Sig Dispense Refill  . Acetaminophen (TYLENOL PO) Take by mouth.    . Lenvatinib 12 mg daily dose (LENVIMA) 3 x 4 MG capsule Take 12 mg by mouth daily. 90 capsule 0  . loperamide (IMODIUM) 2 MG capsule Take 1 capsule (2 mg total) by mouth See admin instructions. With onset of loose stool, take 4mg   followed by 2mg  every 2 hours until 12 hours have passed without loose bowel movement. Maximum: 16 mg/day 60 capsule 1  . Multiple Vitamin (MULTIVITAMIN) capsule Take 1 capsule by mouth daily.    . polyethylene glycol (MIRALAX) 17 g packet Take 17 g by mouth daily. 21 each 0  . senna (SENOKOT) 8.6 MG TABS tablet Take 1 tablet (8.6 mg total) by mouth daily. 120 tablet 0   No current facility-administered medications for this visit.      PHYSICAL EXAMINATION: ECOG PERFORMANCE STATUS: 1 - Symptomatic but completely ambulatory Vitals:   10/25/18 1103  BP: 116/84  Pulse: 88  Resp: 16  Temp: (!) 96.9 F (36.1 C)   Filed Weights   10/25/18 1103  Weight: 133 lb 9.6 oz (60.6 kg)    Physical Exam Constitutional:      General: He is not in acute distress.    Comments: thin  HENT:     Head: Normocephalic and atraumatic.  Eyes:     General: No scleral icterus.    Pupils: Pupils are equal, round, and reactive to light.  Neck:     Musculoskeletal: Normal range of motion and neck supple.  Cardiovascular:     Rate and Rhythm: Normal rate and regular rhythm.     Heart sounds: Normal heart sounds.  Pulmonary:     Effort: Pulmonary effort is normal. No respiratory distress.     Breath sounds: No wheezing.     Comments: Decreased breath sound bilaterally. Abdominal:     General: Bowel sounds are normal. There is no distension.     Palpations: Abdomen is soft. There is no mass.     Tenderness: There is no abdominal tenderness.  Musculoskeletal: Normal range of motion.        General: No deformity.  Skin:    General: Skin is warm and dry.     Findings: No erythema or rash.  Neurological:     Mental Status: He is alert and oriented to person, place, and time.     Cranial Nerves: No cranial nerve deficit.     Coordination: Coordination normal.  Psychiatric:        Behavior: Behavior normal.        Thought Content: Thought content normal.     LABORATORY DATA:  I have reviewed the  data as listed Lab Results  Component Value Date   WBC 3.4 (L) 10/25/2018   HGB 14.6 10/25/2018   HCT 44.0 10/25/2018   MCV 87.5 10/25/2018   PLT 274 10/25/2018   Recent Labs    10/05/18 0905 10/12/18 0934 10/25/18 1045  NA 141 139 140  K 3.4*  3.8 3.5  CL 106 105 102  CO2 27 27 28   GLUCOSE 93 90 120*  BUN 7* 8 8  CREATININE 0.77 0.86 0.69  CALCIUM 9.0 9.3 9.6  GFRNONAA >60 >60 >60  GFRAA >60 >60 >60  PROT 7.3 8.2* 8.1  ALBUMIN 2.9* 3.2* 3.5  AST 32 41 51*  ALT 24 33 48*  ALKPHOS 72 87 87  BILITOT 0.5 0.5 0.6   Iron/TIBC/Ferritin/ %Sat No results found for: IRON, TIBC, FERRITIN, IRONPCTSAT   RADIOGRAPHIC STUDIES: I have personally reviewed the radiological images as listed and agreed with the findings in the report.  No results found.    ASSESSMENT & PLAN:  1. Hepatocellular carcinoma metastatic to left lung ([place])   2. Primary lung adenocarcinoma, left ([place])   3. Orthostatic hypotension   4. Encounter for antineoplastic chemotherapy    #HCC, currently on lenvatinib since 09/07/2018.,  He tolerates lenvatinib.  AFP trended up to 41.9 compared to 3 months ago.  This could have been secondary to the increase during the time he was undergoing additional biopsy and work-up.  Continue monitor.  Trend AFP at the next visit.  #Hypotension secondary to dehydration.  Patient reports adequate fluid intake daily.  Asymptomatic. Blood pressure has improved.  Continue to monitor.  #Lung adenocarcinoma cT3 Nx Mx Status post SBRT to the lung lesion.  Plan repeat CT chest abdomen pelvis in October. Orders Placed This Encounter  Procedures  . CBC with Differential/Platelet    Standing Status:   Future    Standing Expiration Date:   10/26/2019  . Comprehensive metabolic panel    Standing Status:   Future    Standing Expiration Date:   10/26/2019  . AFP tumor marker    Standing Status:   Future    Standing Expiration Date:   10/26/2019    All questions were answered. The  patient knows to call the clinic with any problems questions or concerns. Return of visit:  3 weeks.    Earlie Server, MD, PhD 10/26/2018

## 2018-11-02 ENCOUNTER — Inpatient Hospital Stay: Payer: Medicare Other

## 2018-11-02 ENCOUNTER — Other Ambulatory Visit: Payer: Self-pay

## 2018-11-02 NOTE — Progress Notes (Signed)
Nutrition Follow-up:  Patient with hepatocellular carcinoma metastatic to left lung.  Patient has completed SBRT to the lung lesion. Patient taking lenvatinib.    Met with patient in clinic today.  Patient reports that he has been eating. Reports that yesterday ate cereal and drank coffee then had sweet cake and drank some juice.  Lunch was a pimento cheese sandwich and chicken salad sandwich with small piece of pound cake.  Last night for dinner was 1/2 serving of beef roast, 1/2 tater and okra serving from [employer].  Patient reports that he has been trying to drink ensure 3 times per day.  Also has been trying to snack on cheese and crackers or peanut butter and crackers, ice cream.  Drinks water, SF gatorade as well.    Denies nausea or issues with bowel movements.   Medications: reviewed  Labs: reviewed  Anthropometrics:   Weight 137 lb 7 oz weighed in clinic today, increased from 133 lb on 8/27   NUTRITION DIAGNOSIS:  Inadequate oral intake improved.    INTERVENTION:  Encouraged patient to continue eating high calorie, high protein foods for continued weight gain.  Provided patient with case of ensure enlive.  Encouraged him to continue drinking 3 daily. Contact information provided    MONITORING, EVALUATION, GOAL: Patient will consume adequate calories and protein to prevent weight from decreasing.    NEXT VISIT: October 15 th in clinic  Joli B. Allen, RD, LDN Registered Dietitian [phone] (pager)     

## 2018-11-14 ENCOUNTER — Other Ambulatory Visit: Payer: Self-pay

## 2018-11-14 ENCOUNTER — Encounter: Payer: Self-pay | Admitting: Oncology

## 2018-11-14 NOTE — Progress Notes (Signed)
Patient stated that he had been doing well with no complaints. 

## 2018-11-15 ENCOUNTER — Other Ambulatory Visit: Payer: Self-pay

## 2018-11-15 ENCOUNTER — Inpatient Hospital Stay: Payer: Medicare Other

## 2018-11-15 ENCOUNTER — Inpatient Hospital Stay (HOSPITAL_BASED_OUTPATIENT_CLINIC_OR_DEPARTMENT_OTHER): Payer: Medicare Other | Admitting: Oncology

## 2018-11-15 VITALS — BP 114/88 | HR 96 | Temp 98.1°F | Ht 65.0 in | Wt 138.0 lb

## 2018-11-15 DIAGNOSIS — Z5111 Encounter for antineoplastic chemotherapy: Secondary | ICD-10-CM

## 2018-11-15 DIAGNOSIS — C220 Liver cell carcinoma: Secondary | ICD-10-CM | POA: Diagnosis not present

## 2018-11-15 DIAGNOSIS — J439 Emphysema, unspecified: Secondary | ICD-10-CM | POA: Diagnosis not present

## 2018-11-15 DIAGNOSIS — J948 Other specified pleural conditions: Secondary | ICD-10-CM | POA: Diagnosis not present

## 2018-11-15 DIAGNOSIS — I10 Essential (primary) hypertension: Secondary | ICD-10-CM | POA: Diagnosis not present

## 2018-11-15 DIAGNOSIS — F1010 Alcohol abuse, uncomplicated: Secondary | ICD-10-CM | POA: Diagnosis not present

## 2018-11-15 DIAGNOSIS — Z809 Family history of malignant neoplasm, unspecified: Secondary | ICD-10-CM | POA: Diagnosis not present

## 2018-11-15 DIAGNOSIS — M25512 Pain in left shoulder: Secondary | ICD-10-CM | POA: Diagnosis not present

## 2018-11-15 DIAGNOSIS — R0602 Shortness of breath: Secondary | ICD-10-CM | POA: Diagnosis not present

## 2018-11-15 DIAGNOSIS — C7802 Secondary malignant neoplasm of left lung: Secondary | ICD-10-CM

## 2018-11-15 DIAGNOSIS — C3492 Malignant neoplasm of unspecified part of left bronchus or lung: Secondary | ICD-10-CM

## 2018-11-15 DIAGNOSIS — Z87891 Personal history of nicotine dependence: Secondary | ICD-10-CM | POA: Diagnosis not present

## 2018-11-15 DIAGNOSIS — Z79899 Other long term (current) drug therapy: Secondary | ICD-10-CM | POA: Diagnosis not present

## 2018-11-15 DIAGNOSIS — F17210 Nicotine dependence, cigarettes, uncomplicated: Secondary | ICD-10-CM | POA: Diagnosis not present

## 2018-11-15 DIAGNOSIS — I712 Thoracic aortic aneurysm, without rupture: Secondary | ICD-10-CM | POA: Diagnosis not present

## 2018-11-15 DIAGNOSIS — R5383 Other fatigue: Secondary | ICD-10-CM | POA: Diagnosis not present

## 2018-11-15 DIAGNOSIS — Z7289 Other problems related to lifestyle: Secondary | ICD-10-CM | POA: Diagnosis not present

## 2018-11-15 DIAGNOSIS — B1920 Unspecified viral hepatitis C without hepatic coma: Secondary | ICD-10-CM | POA: Diagnosis not present

## 2018-11-15 DIAGNOSIS — I951 Orthostatic hypotension: Secondary | ICD-10-CM

## 2018-11-15 DIAGNOSIS — J984 Other disorders of lung: Secondary | ICD-10-CM | POA: Diagnosis not present

## 2018-11-15 LAB — CBC WITH DIFFERENTIAL/PLATELET
Abs Immature Granulocytes: 0 10*3/uL (ref 0.00–0.07)
Basophils Absolute: 0 10*3/uL (ref 0.0–0.1)
Basophils Relative: 0 %
Eosinophils Absolute: 0.1 10*3/uL (ref 0.0–0.5)
Eosinophils Relative: 5 %
HCT: 44.5 % (ref 39.0–52.0)
Hemoglobin: 15.1 g/dL (ref 13.0–17.0)
Immature Granulocytes: 0 %
Lymphocytes Relative: 31 %
Lymphs Abs: 0.9 10*3/uL (ref 0.7–4.0)
MCH: 29.8 pg (ref 26.0–34.0)
MCHC: 33.9 g/dL (ref 30.0–36.0)
MCV: 87.9 fL (ref 80.0–100.0)
Monocytes Absolute: 0.4 10*3/uL (ref 0.1–1.0)
Monocytes Relative: 12 %
Neutro Abs: 1.5 10*3/uL — ABNORMAL LOW (ref 1.7–7.7)
Neutrophils Relative %: 52 %
Platelets: 247 10*3/uL (ref 150–400)
RBC: 5.06 MIL/uL (ref 4.22–5.81)
RDW: 17.2 % — ABNORMAL HIGH (ref 11.5–15.5)
WBC: 3 10*3/uL — ABNORMAL LOW (ref 4.0–10.5)
nRBC: 0 % (ref 0.0–0.2)

## 2018-11-15 LAB — COMPREHENSIVE METABOLIC PANEL
ALT: 63 U/L — ABNORMAL HIGH (ref 0–44)
AST: 74 U/L — ABNORMAL HIGH (ref 15–41)
Albumin: 3.8 g/dL (ref 3.5–5.0)
Alkaline Phosphatase: 88 U/L (ref 38–126)
Anion gap: 8 (ref 5–15)
BUN: 12 mg/dL (ref 8–23)
CO2: 27 mmol/L (ref 22–32)
Calcium: 9.7 mg/dL (ref 8.9–10.3)
Chloride: 106 mmol/L (ref 98–111)
Creatinine, Ser: 0.77 mg/dL (ref 0.61–1.24)
GFR calc Af Amer: >60 mL/min (ref >60)
GFR calc non Af Amer: >60 mL/min (ref >60)
Glucose, Bld: 123 mg/dL — ABNORMAL HIGH (ref 70–99)
Potassium: 3.6 mmol/L (ref 3.5–5.1)
Sodium: 141 mmol/L (ref 135–145)
Total Bilirubin: 0.6 mg/dL (ref 0.3–1.2)
Total Protein: 8.4 g/dL — ABNORMAL HIGH (ref 6.5–8.1)

## 2018-11-16 ENCOUNTER — Other Ambulatory Visit: Payer: Self-pay

## 2018-11-16 ENCOUNTER — Encounter: Payer: Self-pay | Admitting: Radiation Oncology

## 2018-11-16 ENCOUNTER — Ambulatory Visit
Admission: RE | Admit: 2018-11-16 | Discharge: 2018-11-16 | Disposition: A | Discharge status: Home / Self Care | Payer: Medicare Other | Source: Ambulatory Visit | Attending: Radiation Oncology | Admitting: Radiation Oncology

## 2018-11-16 VITALS — BP 131/106 | HR 86 | Temp 97.4°F | Resp 16 | Wt 137.9 lb

## 2018-11-16 DIAGNOSIS — C3412 Malignant neoplasm of upper lobe, left bronchus or lung: Secondary | ICD-10-CM | POA: Insufficient documentation

## 2018-11-16 DIAGNOSIS — Z923 Personal history of irradiation: Secondary | ICD-10-CM | POA: Insufficient documentation

## 2018-11-16 DIAGNOSIS — C220 Liver cell carcinoma: Secondary | ICD-10-CM | POA: Diagnosis not present

## 2018-11-16 DIAGNOSIS — C7802 Secondary malignant neoplasm of left lung: Secondary | ICD-10-CM

## 2018-11-16 LAB — AFP TUMOR MARKER: AFP, Serum, Tumor Marker: 115 ng/mL — ABNORMAL HIGH (ref 0.0–8.3)

## 2018-11-16 NOTE — Progress Notes (Signed)
Radiation Oncology Follow up Note  Name: Samuel Hood.   Date:   11/16/2018 MRN:  [id number] DOB: 1951-01-23    This 68 y.o. male presents to the clinic today for 1 month follow-up status post definitive radiation therapy to his left upper lobe for poorly differentiated adenocarcinoma a T3 lesion.  REFERRING PROVIDER: Trinna Post, PA-C  HPI: Patient is a 68 year old male now at 1 month having completed definitive radiation therapy to his left upper lobe for a T3 non-small cell lung cancer.  Tumor was adenocarcinoma poorly differentiated.  He was also treated back in June 2020 for a paraspinal mass in patient with known hepatocellular carcinoma.  He is seen today in routine follow-up is doing well specifically denies cough hemoptysis or chest tightness.  He is concerned about his hypertension he has been taken off of his blood pressure medications secondary to recent episodes of hypotension.Marland Kitchen  He is currently oncurrently on lenvatinib  and tolerating that well.  COMPLICATIONS OF TREATMENT: none  FOLLOW UP COMPLIANCE: keeps appointments   PHYSICAL EXAM:  BP (!) 131/106 (BP Location: Left Arm, Patient Position: Sitting)   Pulse 86   Temp (!) 97.4 F (36.3 C) (Tympanic)   Resp 16   Wt 137 lb 14.4 oz (62.6 kg)   BMI 22.95 kg/m  Well-developed well-nourished patient in NAD. HEENT reveals PERLA, EOMI, discs not visualized.  Oral cavity is clear. No oral mucosal lesions are identified. Neck is clear without evidence of cervical or supraclavicular adenopathy. Lungs are clear to A&P. Cardiac examination is essentially unremarkable with regular rate and rhythm without murmur rub or thrill. Abdomen is benign with no organomegaly or masses noted. Motor sensory and DTR levels are equal and symmetric in the upper and lower extremities. Cranial nerves II through XII are grossly intact. Proprioception is intact. No peripheral adenopathy or edema is identified. No motor or sensory levels are noted.  Crude visual fields are within normal range.  RADIOLOGY RESULTS: No current films to review CT scan of the chest has been scheduled for October which I will review when it is available.  PLAN: Present time patient is doing well I have asked him to review his restarting his hypertensive medications with medical oncology.  Otherwise I will review his CT scan which she has in mid October.  I have asked to see him back in 3 to 4 months for follow-up.  He continues close follow-up care and treatment under medical oncology's direction.  Patient knows to call with any concerns.  I would like to take this opportunity to thank you for allowing me to participate in the care of your patient.Noreene Filbert, MD

## 2018-11-16 NOTE — Progress Notes (Signed)
Hematology/Oncology follow up note [facility] [phone] [phone]) [phone]   Patient Care Team: Paulene Floor as PCP - General (Physician Assistant) Telford Nab, RN as Registered Nurse Clent Jacks, RN as Registered Nurse  REFERRING PROVIDER: Trinna Post, PA-C  CHIEF COMPLAINTS/REASON FOR VISIT:  Discussion of metastatic cancer management.  HISTORY OF PRESENTING ILLNESS:   Samuel Hood. is a  68 y.o.  male with PMH listed below was seen in consultation at the request of  Terrilee Croak, Adriana M, PA-C  for evaluation of lung nodule in the liver lesion. Patient has a past medical history of hypertension, alcohol abuse, smoking emphysema.  He has had serial CTs done in the past.  CT images were independently reviewed by me. 07/26/2017 CT chest with contrast showed extensive pleural-parenchymal scarring within the left upper lobe with several areas of soft tissue nodularity measuring up to 1.6 cm.  In this patient who is at increased risk of lung cancer further investigation with PET scan is recommended. Small chronic appearance loculated hydropneumothorax overlies the left apex. Slowly enlarging lesion within the central portion of the right lobe of liver identified. Per note, patient supposed to have additional work-up done in December repeat CT scan which he did not  Patient was recently seen by primary care provider and has subsequent image done for follow-up. CT on 06/30/2018 showed multiple significantly enlarged paraspinal mass are noted concerning.  The largest measures 3.8 x 0.5 cm and there appears to be a lytic destruction of the adjacent T11 vertebral body.  Also noted is new solid density measuring 17 x 12 mm in the pleural parenchymal density in the left upper lobe noted on prior exam.  Concerning for malignancy.  4.4 ascending thoracic aortic aneurysm.  Recommend annual imaging.  Patient had PET scan done on 07/06/2018  Which showed there are 2 FDG avid lesion within the left upper lobe worrisome for primary lung neoplasm.  There is evidence of chest wall involvement.  Metastasis to the posterior mediastinum is identified with extension into T11 vertebra and possible involvement of the left T12 neural foramina.  Patient reports left shoulder pain.  Smoking half a pack a day not motivated with further questioning quitting Denies weight loss, hemoptysis, cough.  Chronic shortness of breath with exertion. He drinks alcohol daily. Lives with his girlfriend.  He has 5 adult kids  #Hepatitis C  # 6/1?2020  CT-guided biopsy of left-sided posterior mediastinal soft tissue adjacent to T10/11. Patient's case was discussed on tumor board. Consensus was reached that [facility] versus primary lung cancer with hepatoid adenocarcinoma features. Consensus was to proceed with liver biopsy first as liver mass was not FDG avid on PET scan.  Patient underwent liver biopsy and the present to discuss pathology reports.  ## 08/08/2018 Liver mass biopsy showed fragments of necrotic and calcified tissue with adjacent fibrous capsule Scant viable nonneoplastic liver tissue with steatosis, inflammation and non specific fibrosis.  # 08/28/2018 CT guided left upper lobe lung mass biopsy showed poorly differentiated adenocarcinoma of lung origin.  6/19-08/16/2018  Finished palliative radiation to paraspinal soft tissue mass. 09/07/2018 patient was started on lenvatinib 12 mg daily. 10/13/2018 SBRT to lung cancer lesion.  INTERVAL HISTORY Samuel Hood. is a 68 y.o. male who has above history reviewed by me today presents for follow up visit for assessment of tolerability of chemotherapy. Patient reports feeling well at baseline today.  Denies any pain. No lightheaded or dizziness today. Appetite  is fair.  He is eating well. He has gained 5 pounds compared to his visit 4 weeks ago. Patient reports to be compliant with lenvatinib 12 mg daily.    .  Review of Systems  Constitutional: Positive for fatigue. Negative for appetite change, chills, fever and unexpected weight change.  HENT:   Negative for hearing loss and voice change.   Eyes: Negative for eye problems and icterus.  Respiratory: Negative for chest tightness, cough and shortness of breath.   Cardiovascular: Negative for chest pain and leg swelling.  Gastrointestinal: Negative for abdominal distention, abdominal pain and blood in stool.  Endocrine: Negative for hot flashes.  Genitourinary: Negative for difficulty urinating, dysuria and frequency.   Musculoskeletal: Negative for arthralgias.  Skin: Negative for itching and rash.  Neurological: Negative for dizziness, extremity weakness, light-headedness and numbness.  Hematological: Negative for adenopathy. Does not bruise/bleed easily.  Psychiatric/Behavioral: Negative for confusion.    MEDICAL HISTORY:  Past Medical History:  Diagnosis Date  . Hepatocellular carcinoma metastatic to left lung ([place]) 07/24/2018  . History of angiography    left lower extremity  . Hypertension   . Small bowel obstruction ([place])     SURGICAL HISTORY: Past Surgical History:  Procedure Laterality Date  . COLON SURGERY    . COLONOSCOPY W/ POLYPECTOMY    . COLONOSCOPY WITH PROPOFOL N/A 01/31/2018   Procedure: COLONOSCOPY WITH PROPOFOL;  Surgeon: Toledo, Benay Pike, MD;  Location: [facility] ENDOSCOPY;  Service: Gastroenterology;  Laterality: N/A;  . debridement fasciotomy leg, left Left 03/19/2016  . LAPAROTOMY N/A 09/27/2014   Procedure: EXPLORATORY LAPAROTOMY;  Surgeon: Dia Crawford III, MD;  Location: [facility] ORS;  Service: General;  Laterality: N/A;    SOCIAL HISTORY: Social History   Socioeconomic History  . Marital status: Single    Spouse name: Not on file  . Number of children: Not on file  . Years of education: Not on file  . Highest education level: Not on file  Occupational History  . Occupation: retired  Scientific laboratory technician  .  Financial resource strain: Hard  . Food insecurity    Worry: Never true    Inability: Never true  . Transportation needs    Medical: No    Non-medical: No  Tobacco Use  . Smoking status: Former Smoker    Packs/day: 0.25    Years: 50.00    Pack years: 12.50  . Smokeless tobacco: Never Used  . Tobacco comment: 1-2/week  Substance and Sexual Activity  . Alcohol use: Yes    Alcohol/week: 35.0 standard drinks    Types: 30 Cans of beer, 5 Shots of liquor per week  . Drug use: No  . Sexual activity: Not on file  Lifestyle  . Physical activity    Days per week: 7 days    Minutes per session: 30 min  . Stress: Not at all  Relationships  . Social connections    Talks on phone: More than three times a week    Gets together: More than three times a week    Attends religious service: More than 4 times per year    Active member of club or organization: Not on file    Attends meetings of clubs or organizations: Not on file    Relationship status: Not on file  . Intimate partner violence    Fear of current or ex partner: Patient refused    Emotionally abused: No    Physically abused: No    Forced sexual activity: No  Other Topics Concern  . Not on file  Social History Narrative  . Not on file    FAMILY HISTORY: Family History  Problem Relation Age of Onset  . Cancer Mother   . Cancer Father     ALLERGIES:  has No Known Allergies.  MEDICATIONS:  Current Outpatient Medications  Medication Sig Dispense Refill  . Acetaminophen (TYLENOL PO) Take by mouth.    . Lenvatinib 12 mg daily dose (LENVIMA) 3 x 4 MG capsule Take 12 mg by mouth daily. 90 capsule 0  . loperamide (IMODIUM) 2 MG capsule Take 1 capsule (2 mg total) by mouth See admin instructions. With onset of loose stool, take 4mg  followed by 2mg  every 2 hours until 12 hours have passed without loose bowel movement. Maximum: 16 mg/day 60 capsule 1  . Multiple Vitamin (MULTIVITAMIN) capsule Take 1 capsule by mouth daily.     . polyethylene glycol (MIRALAX) 17 g packet Take 17 g by mouth daily. 21 each 0  . senna (SENOKOT) 8.6 MG TABS tablet Take 1 tablet (8.6 mg total) by mouth daily. 120 tablet 0   No current facility-administered medications for this visit.      PHYSICAL EXAMINATION: ECOG PERFORMANCE STATUS: 1 - Symptomatic but completely ambulatory Vitals:   11/15/18 1022  BP: 114/88  Pulse: 96  Temp: 98.1 F (36.7 C)  SpO2: 100%   Filed Weights   11/15/18 1022  Weight: 138 lb (62.6 kg)    Physical Exam Constitutional:      General: He is not in acute distress.    Comments: thin  HENT:     Head: Normocephalic and atraumatic.  Eyes:     General: No scleral icterus.    Pupils: Pupils are equal, round, and reactive to light.  Neck:     Musculoskeletal: Normal range of motion and neck supple.  Cardiovascular:     Rate and Rhythm: Normal rate and regular rhythm.     Heart sounds: Normal heart sounds.  Pulmonary:     Effort: Pulmonary effort is normal. No respiratory distress.     Breath sounds: No wheezing.     Comments: Decreased breath sound bilaterally. Abdominal:     General: Bowel sounds are normal. There is no distension.     Palpations: Abdomen is soft. There is no mass.     Tenderness: There is no abdominal tenderness.  Musculoskeletal: Normal range of motion.        General: No deformity.  Skin:    General: Skin is warm and dry.     Findings: No erythema or rash.  Neurological:     Mental Status: He is alert and oriented to person, place, and time.     Cranial Nerves: No cranial nerve deficit.     Coordination: Coordination normal.  Psychiatric:        Behavior: Behavior normal.        Thought Content: Thought content normal.     LABORATORY DATA:  I have reviewed the data as listed Lab Results  Component Value Date   WBC 3.0 (L) 11/15/2018   HGB 15.1 11/15/2018   HCT 44.5 11/15/2018   MCV 87.9 11/15/2018   PLT 247 11/15/2018   Recent Labs    10/12/18 0934  10/25/18 1045 11/15/18 1002  NA 139 140 141  K 3.8 3.5 3.6  CL 105 102 106  CO2 27 28 27   GLUCOSE 90 120* 123*  BUN 8 8 12   CREATININE 0.86 0.69 0.77  CALCIUM  9.3 9.6 9.7  GFRNONAA >60 >60 >60  GFRAA >60 >60 >60  PROT 8.2* 8.1 8.4*  ALBUMIN 3.2* 3.5 3.8  AST 41 51* 74*  ALT 33 48* 63*  ALKPHOS 87 87 88  BILITOT 0.5 0.6 0.6   Iron/TIBC/Ferritin/ %Sat No results found for: IRON, TIBC, FERRITIN, IRONPCTSAT   RADIOGRAPHIC STUDIES: I have personally reviewed the radiological images as listed and agreed with the findings in the report.  No results found.    ASSESSMENT & PLAN:  1. Hepatocellular carcinoma metastatic to left lung ([place])   2. Primary lung adenocarcinoma, left ([place])   3. Encounter for antineoplastic chemotherapy    #HCC, currently on lenvatinib since 09/07/2018.,  Tolerates lenvatinib 12 mg daily. AFP continues to trend up to 115 Plan repeat CT scanning to evaluate treatment response.  #Lung adenocarcinoma cT3 Nx Mx Status post SBRT to the lung lesion.  repeat CT chest abdomen pelvis in October.  Orders Placed This Encounter  Procedures  . CT Chest W Contrast    Standing Status:   Future    Standing Expiration Date:   11/15/2019    Order Specific Question:   If indicated for the ordered procedure, I authorize the administration of contrast media per Radiology protocol    Answer:   Yes    Order Specific Question:   Preferred imaging location?    Answer:   [facility]    Order Specific Question:   Radiology Contrast Protocol - do NOT remove file path    Answer:   \\[email or web address]  . CT Abdomen Pelvis W Contrast    Standing Status:   Future    Standing Expiration Date:   11/15/2019    Order Specific Question:   If indicated for the ordered procedure, I authorize the administration of contrast media per Radiology protocol    Answer:   Yes    Order Specific Question:   Preferred [facility] location?    [facility]    Order Specific Question:   Is Oral Contrast requested for this exam?    Answer:   Yes, Per Radiology protocol    Order Specific Question:   Radiology Contrast Protocol - do NOT remove file path    Answer:   \\[email or web address]  . CBC with Differential/Platelet    Standing Status:   Future    Standing Expiration Date:   11/15/2019  . Comprehensive metabolic panel    Standing Status:   Future    Standing Expiration Date:   11/15/2019  . AFP tumor marker    Standing Status:   Future    Standing Expiration Date:   11/15/2019    All questions were answered. The patient knows to call the clinic with any problems questions or concerns. Return of visit:  4 weeks.    Earlie Server, MD, PhD 11/16/2018

## 2018-11-29 ENCOUNTER — Telehealth: Payer: Self-pay

## 2018-11-29 ENCOUNTER — Ambulatory Visit
Admission: RE | Admit: 2018-11-29 | Discharge: 2018-11-29 | Disposition: A | Discharge status: Home / Self Care | Payer: Medicare Other | Source: Ambulatory Visit | Attending: Oncology | Admitting: Oncology

## 2018-11-29 ENCOUNTER — Other Ambulatory Visit: Payer: Self-pay

## 2018-11-29 DIAGNOSIS — C3492 Malignant neoplasm of unspecified part of left bronchus or lung: Secondary | ICD-10-CM | POA: Diagnosis not present

## 2018-11-29 DIAGNOSIS — C3412 Malignant neoplasm of upper lobe, left bronchus or lung: Secondary | ICD-10-CM | POA: Diagnosis not present

## 2018-11-29 DIAGNOSIS — C7802 Secondary malignant neoplasm of left lung: Secondary | ICD-10-CM | POA: Insufficient documentation

## 2018-11-29 DIAGNOSIS — C220 Liver cell carcinoma: Secondary | ICD-10-CM | POA: Diagnosis not present

## 2018-11-29 MED ORDER — IOHEXOL 300 MG/ML  SOLN
100.0000 mL | Freq: Once | INTRAMUSCULAR | Status: AC | PRN
  Administered 2018-11-29: 100 mL via INTRAVENOUS

## 2018-11-29 NOTE — Telephone Encounter (Signed)
Nutrition  Patient scheduled to see RD on 10/15 for nutrition follow-up.  Called and spoke with patient to reschedule nutrition appointment to 10/29 at 9:45.  Patient agreeable.   [name] B. [name], [place], [place] Registered Dietitian [phone] (pager)

## 2018-11-30 ENCOUNTER — Inpatient Hospital Stay: Payer: Medicare Other

## 2018-12-12 ENCOUNTER — Other Ambulatory Visit: Payer: Self-pay

## 2018-12-12 NOTE — Progress Notes (Signed)
Patient pre screened for office appointment, no questions or concerns today. 

## 2018-12-13 ENCOUNTER — Inpatient Hospital Stay: Payer: Medicare Other | Attending: Oncology

## 2018-12-13 ENCOUNTER — Encounter: Payer: Self-pay | Admitting: Oncology

## 2018-12-13 ENCOUNTER — Inpatient Hospital Stay (HOSPITAL_BASED_OUTPATIENT_CLINIC_OR_DEPARTMENT_OTHER): Payer: Medicare Other | Admitting: Oncology

## 2018-12-13 ENCOUNTER — Other Ambulatory Visit: Payer: Self-pay

## 2018-12-13 VITALS — BP 132/90 | HR 76 | Temp 96.0°F | Resp 18 | Wt 141.3 lb

## 2018-12-13 DIAGNOSIS — I2510 Atherosclerotic heart disease of native coronary artery without angina pectoris: Secondary | ICD-10-CM | POA: Insufficient documentation

## 2018-12-13 DIAGNOSIS — J948 Other specified pleural conditions: Secondary | ICD-10-CM | POA: Insufficient documentation

## 2018-12-13 DIAGNOSIS — R7401 Elevation of levels of liver transaminase levels: Secondary | ICD-10-CM | POA: Insufficient documentation

## 2018-12-13 DIAGNOSIS — Z809 Family history of malignant neoplasm, unspecified: Secondary | ICD-10-CM | POA: Insufficient documentation

## 2018-12-13 DIAGNOSIS — Z79899 Other long term (current) drug therapy: Secondary | ICD-10-CM | POA: Diagnosis not present

## 2018-12-13 DIAGNOSIS — R5383 Other fatigue: Secondary | ICD-10-CM | POA: Insufficient documentation

## 2018-12-13 DIAGNOSIS — M5136 Other intervertebral disc degeneration, lumbar region: Secondary | ICD-10-CM | POA: Diagnosis not present

## 2018-12-13 DIAGNOSIS — C3492 Malignant neoplasm of unspecified part of left bronchus or lung: Secondary | ICD-10-CM

## 2018-12-13 DIAGNOSIS — R599 Enlarged lymph nodes, unspecified: Secondary | ICD-10-CM | POA: Insufficient documentation

## 2018-12-13 DIAGNOSIS — C220 Liver cell carcinoma: Secondary | ICD-10-CM | POA: Diagnosis not present

## 2018-12-13 DIAGNOSIS — J439 Emphysema, unspecified: Secondary | ICD-10-CM | POA: Diagnosis not present

## 2018-12-13 DIAGNOSIS — C7802 Secondary malignant neoplasm of left lung: Secondary | ICD-10-CM | POA: Diagnosis not present

## 2018-12-13 DIAGNOSIS — I719 Aortic aneurysm of unspecified site, without rupture: Secondary | ICD-10-CM | POA: Insufficient documentation

## 2018-12-13 DIAGNOSIS — I700 Atherosclerosis of aorta: Secondary | ICD-10-CM | POA: Diagnosis not present

## 2018-12-13 DIAGNOSIS — R0602 Shortness of breath: Secondary | ICD-10-CM | POA: Insufficient documentation

## 2018-12-13 DIAGNOSIS — F1010 Alcohol abuse, uncomplicated: Secondary | ICD-10-CM | POA: Insufficient documentation

## 2018-12-13 DIAGNOSIS — I712 Thoracic aortic aneurysm, without rupture: Secondary | ICD-10-CM | POA: Diagnosis not present

## 2018-12-13 DIAGNOSIS — K8020 Calculus of gallbladder without cholecystitis without obstruction: Secondary | ICD-10-CM | POA: Diagnosis not present

## 2018-12-13 DIAGNOSIS — I10 Essential (primary) hypertension: Secondary | ICD-10-CM | POA: Diagnosis not present

## 2018-12-13 DIAGNOSIS — Z5111 Encounter for antineoplastic chemotherapy: Secondary | ICD-10-CM | POA: Diagnosis not present

## 2018-12-13 DIAGNOSIS — B1920 Unspecified viral hepatitis C without hepatic coma: Secondary | ICD-10-CM | POA: Insufficient documentation

## 2018-12-13 DIAGNOSIS — R160 Hepatomegaly, not elsewhere classified: Secondary | ICD-10-CM | POA: Insufficient documentation

## 2018-12-13 DIAGNOSIS — M25512 Pain in left shoulder: Secondary | ICD-10-CM | POA: Insufficient documentation

## 2018-12-13 DIAGNOSIS — Z87891 Personal history of nicotine dependence: Secondary | ICD-10-CM | POA: Diagnosis not present

## 2018-12-13 DIAGNOSIS — C7951 Secondary malignant neoplasm of bone: Secondary | ICD-10-CM | POA: Diagnosis not present

## 2018-12-13 LAB — CBC WITH DIFFERENTIAL/PLATELET
Abs Immature Granulocytes: 0 10*3/uL (ref 0.00–0.07)
Basophils Absolute: 0 10*3/uL (ref 0.0–0.1)
Basophils Relative: 1 %
Eosinophils Absolute: 0.1 10*3/uL (ref 0.0–0.5)
Eosinophils Relative: 3 %
HCT: 43.5 % (ref 39.0–52.0)
Hemoglobin: 14.6 g/dL (ref 13.0–17.0)
Immature Granulocytes: 0 %
Lymphocytes Relative: 31 %
Lymphs Abs: 1.1 10*3/uL (ref 0.7–4.0)
MCH: 30 pg (ref 26.0–34.0)
MCHC: 33.6 g/dL (ref 30.0–36.0)
MCV: 89.3 fL (ref 80.0–100.0)
Monocytes Absolute: 0.4 10*3/uL (ref 0.1–1.0)
Monocytes Relative: 12 %
Neutro Abs: 1.9 10*3/uL (ref 1.7–7.7)
Neutrophils Relative %: 53 %
Platelets: 231 10*3/uL (ref 150–400)
RBC: 4.87 MIL/uL (ref 4.22–5.81)
RDW: 17.4 % — ABNORMAL HIGH (ref 11.5–15.5)
WBC: 3.7 10*3/uL — ABNORMAL LOW (ref 4.0–10.5)
nRBC: 0 % (ref 0.0–0.2)

## 2018-12-13 LAB — COMPREHENSIVE METABOLIC PANEL
ALT: 57 U/L — ABNORMAL HIGH (ref 0–44)
AST: 60 U/L — ABNORMAL HIGH (ref 15–41)
Albumin: 3.6 g/dL (ref 3.5–5.0)
Alkaline Phosphatase: 75 U/L (ref 38–126)
Anion gap: 9 (ref 5–15)
BUN: 7 mg/dL — ABNORMAL LOW (ref 8–23)
CO2: 27 mmol/L (ref 22–32)
Calcium: 9.4 mg/dL (ref 8.9–10.3)
Chloride: 107 mmol/L (ref 98–111)
Creatinine, Ser: 0.66 mg/dL (ref 0.61–1.24)
GFR calc Af Amer: >60 mL/min (ref >60)
GFR calc non Af Amer: >60 mL/min (ref >60)
Glucose, Bld: 81 mg/dL (ref 70–99)
Potassium: 3.5 mmol/L (ref 3.5–5.1)
Sodium: 143 mmol/L (ref 135–145)
Total Bilirubin: 0.5 mg/dL (ref 0.3–1.2)
Total Protein: 7.6 g/dL (ref 6.5–8.1)

## 2018-12-14 ENCOUNTER — Inpatient Hospital Stay: Payer: Medicare Other

## 2018-12-14 ENCOUNTER — Other Ambulatory Visit: Payer: Self-pay

## 2018-12-14 LAB — AFP TUMOR MARKER: AFP, Serum, Tumor Marker: 60.2 ng/mL — ABNORMAL HIGH (ref 0.0–8.3)

## 2018-12-14 NOTE — Progress Notes (Signed)
Nutrition Follow-up:  Patient with hepatocellular metastatic to left lung.  Patient has completed SBRT to the lung lesion.  Patient taking lenvatinib.    Met with patient in clinic this am.  Patient reports that he is still eating as much as he can.  Reports that yesterday ate cereal for breakfast, hamburger for lunch and fish plate for supper.  Has been drinking sugar free gatorade.  Did eat honeybun yesterday too.  Also likes fruit cups, cooked greens, boiled eggs and cheese toast.  Has been trying to drink 3 ensure daily.    Reports that he has not had any alcohol in 6 months.    Reports that he is trying to walk some everyday.    Denies nausea or constipation or diarrhea.    Medications: reviewed  Labs: reviewed  Anthropometrics:   Weight increased to 141 lb 4.8 oz yesterday (10/28) from 137 lb 7 oz on 9/17 and from 133 lb on 8/27   NUTRITION DIAGNOSIS: Inadequate oral intake improved   INTERVENTION:  Reviewed good well balanced diet and foods to include.   Provided complimentary case of ensure enlive to patient with boost samples.   Patient has contact information and knows to contact me    MONITORING, EVALUATION, GOAL: Patient will consume adequate calories and protein to prevent weight from decreasing   NEXT VISIT: Thursday, Dec 3rd   Keithen Capo B. [name], [place], [place] Registered Dietitian [phone] (pager)

## 2018-12-16 ENCOUNTER — Encounter: Payer: Self-pay | Admitting: Oncology

## 2018-12-16 DIAGNOSIS — C7951 Secondary malignant neoplasm of bone: Secondary | ICD-10-CM | POA: Insufficient documentation

## 2018-12-16 NOTE — Progress Notes (Signed)
Hematology/Oncology follow up note [facility] [phone] [phone]   Patient Care Team: Paulene Floor as PCP - General (Physician Assistant) Telford Nab, RN as Registered Nurse Clent Jacks, RN as Registered Nurse  REFERRING PROVIDER: Trinna Post, PA-C  CHIEF COMPLAINTS/REASON FOR VISIT:  Discussion of metastatic cancer management.  HISTORY OF PRESENTING ILLNESS:   Samuel Hood. is a  68 y.o.  male with PMH listed below was seen in consultation at the request of  Terrilee Croak, Adriana M, PA-C  for evaluation of lung nodule in the liver lesion. Patient has a past medical history of hypertension, alcohol abuse, smoking emphysema.  He has had serial CTs done in the past.  CT images were independently reviewed by me. 07/26/2017 CT chest with contrast showed extensive pleural-parenchymal scarring within the left upper lobe with several areas of soft tissue nodularity measuring up to 1.6 cm.  In this patient who is at increased risk of lung cancer further investigation with PET scan is recommended. Small chronic appearance loculated hydropneumothorax overlies the left apex. Slowly enlarging lesion within the central portion of the right lobe of liver identified. Per note, patient supposed to have additional work-up done in December repeat CT scan which he did not  Patient was recently seen by primary care provider and has subsequent image done for follow-up. CT on 06/30/2018 showed multiple significantly enlarged paraspinal mass are noted concerning.  The largest measures 3.8 x 0.5 cm and there appears to be a lytic destruction of the adjacent T11 vertebral body.  Also noted is new solid density measuring 17 x 12 mm in the pleural parenchymal density in the left upper lobe noted on prior exam.  Concerning for malignancy.  4.4 ascending thoracic aortic aneurysm.  Recommend annual imaging.  Patient had PET scan done on  07/06/2018 Which showed there are 2 FDG avid lesion within the left upper lobe worrisome for primary lung neoplasm.  There is evidence of chest wall involvement.  Metastasis to the posterior mediastinum is identified with extension into T11 vertebra and possible involvement of the left T12 neural foramina.  Patient reports left shoulder pain.  Smoking half a pack a day not motivated with further questioning quitting Denies weight loss, hemoptysis, cough.  Chronic shortness of breath with exertion. He drinks alcohol daily. Lives with his girlfriend.  He has 5 adult kids  #Hepatitis C  # 6/1?2020  CT-guided biopsy of left-sided posterior mediastinal soft tissue adjacent to T10/11. Patient's case was discussed on tumor board. Consensus was reached that [facility] versus primary lung cancer with hepatoid adenocarcinoma features. Consensus was to proceed with liver biopsy first as liver mass was not FDG avid on PET scan.  Patient underwent liver biopsy and the present to discuss pathology reports.  ## 08/08/2018 Liver mass biopsy showed fragments of necrotic and calcified tissue with adjacent fibrous capsule Scant viable nonneoplastic liver tissue with steatosis, inflammation and non specific fibrosis.  # 08/28/2018 CT guided left upper lobe lung mass biopsy showed poorly differentiated adenocarcinoma of lung origin.  6/19-08/16/2018  Finished palliative radiation to paraspinal soft tissue mass. 09/07/2018 patient was started on lenvatinib 12 mg daily. 10/13/2018 SBRT to lung cancer lesion.  INTERVAL HISTORY Samuel Hood. is a 68 y.o. male who has above history reviewed by me today presents for follow up visit for assessment of tolerability of chemotherapy. Patient has been on lenvatinib 12 mg daily.  Tolerating well. Appetite is good.  He is  eating well.  Weight has been stable. Denies any pain today. No new complaints. Fatigue unchanged.  .  Review of Systems  Constitutional: Positive for fatigue.  Negative for appetite change, chills, fever and unexpected weight change.  HENT:   Negative for hearing loss and voice change.   Eyes: Negative for eye problems and icterus.  Respiratory: Negative for chest tightness, cough and shortness of breath.   Cardiovascular: Negative for chest pain and leg swelling.  Gastrointestinal: Negative for abdominal distention, abdominal pain and blood in stool.  Endocrine: Negative for hot flashes.  Genitourinary: Negative for difficulty urinating, dysuria and frequency.   Musculoskeletal: Negative for arthralgias.  Skin: Negative for itching and rash.  Neurological: Negative for dizziness, extremity weakness, light-headedness and numbness.  Hematological: Negative for adenopathy. Does not bruise/bleed easily.  Psychiatric/Behavioral: Negative for confusion.    MEDICAL HISTORY:  Past Medical History:  Diagnosis Date   Hepatocellular carcinoma metastatic to left lung ([place]) 07/24/2018   History of angiography    left lower extremity   Hypertension    Small bowel obstruction ([place])     SURGICAL HISTORY: Past Surgical History:  Procedure Laterality Date   COLON SURGERY     COLONOSCOPY W/ POLYPECTOMY     COLONOSCOPY WITH PROPOFOL N/A 01/31/2018   Procedure: COLONOSCOPY WITH PROPOFOL;  Surgeon: Toledo, Benay Pike, MD;  Location: [facility] ENDOSCOPY;  Service: Gastroenterology;  Laterality: N/A;   debridement fasciotomy leg, left Left 03/19/2016   LAPAROTOMY N/A 09/27/2014   Procedure: EXPLORATORY LAPAROTOMY;  Surgeon: Dia Crawford III, MD;  Location: [facility] ORS;  Service: General;  Laterality: N/A;    SOCIAL HISTORY: Social History   Socioeconomic History   Marital status: Single    Spouse name: Not on file   Number of children: Not on file   Years of education: Not on file   Highest education level: Not on file  Occupational History   Occupation: retired  Scientist, product/process development strain: Hard   Food insecurity    Worry: Never  true    Inability: Never true   Transportation needs    Medical: No    Non-medical: No  Tobacco Use   Smoking status: Former Smoker    Packs/day: 0.25    Years: 50.00    Pack years: 12.50   Smokeless tobacco: Never Used   Tobacco comment: 1-2/week  Substance and Sexual Activity   Alcohol use: Yes    Alcohol/week: 35.0 standard drinks    Types: 30 Cans of beer, 5 Shots of liquor per week   Drug use: No   Sexual activity: Not on file  Lifestyle   Physical activity    Days per week: 7 days    Minutes per session: 30 min   Stress: Not at all  Relationships   Social connections    Talks on phone: More than three times a week    Gets together: More than three times a week    Attends religious service: More than 4 times per year    Active member of club or organization: Not on file    Attends meetings of clubs or organizations: Not on file    Relationship status: Not on file   Intimate partner violence    Fear of current or ex partner: Patient refused    Emotionally abused: No    Physically abused: No    Forced sexual activity: No  Other Topics Concern   Not on file  Social History Narrative  Not on file    FAMILY HISTORY: Family History  Problem Relation Age of Onset   Cancer Mother    Cancer Father     ALLERGIES:  has No Known Allergies.  MEDICATIONS:  Current Outpatient Medications  Medication Sig Dispense Refill   Acetaminophen (TYLENOL PO) Take by mouth.     Lenvatinib 12 mg daily dose (LENVIMA) 3 x 4 MG capsule Take 12 mg by mouth daily. 90 capsule 0   loperamide (IMODIUM) 2 MG capsule Take 1 capsule (2 mg total) by mouth See admin instructions. With onset of loose stool, take 4mg  followed by 2mg  every 2 hours until 12 hours have passed without loose bowel movement. Maximum: 16 mg/day 60 capsule 1   Multiple Vitamin (MULTIVITAMIN) capsule Take 1 capsule by mouth daily.     polyethylene glycol (MIRALAX) 17 g packet Take 17 g by mouth daily.  21 each 0   senna (SENOKOT) 8.6 MG TABS tablet Take 1 tablet (8.6 mg total) by mouth daily. 120 tablet 0   No current facility-administered medications for this visit.      PHYSICAL EXAMINATION: ECOG PERFORMANCE STATUS: 1 - Symptomatic but completely ambulatory Vitals:   12/13/18 1010  BP: 132/90  Pulse: 76  Resp: 18  Temp: (!) 96 F (35.6 C)  SpO2: 99%   Filed Weights   12/13/18 1010  Weight: 141 lb 4.8 oz (64.1 kg)    Physical Exam Constitutional:      General: He is not in acute distress.    Comments: Thin built, walk independently  HENT:     Head: Normocephalic and atraumatic.  Eyes:     General: No scleral icterus.    Pupils: Pupils are equal, round, and reactive to light.  Neck:     Musculoskeletal: Normal range of motion and neck supple.  Cardiovascular:     Rate and Rhythm: Normal rate and regular rhythm.     Heart sounds: Normal heart sounds.  Pulmonary:     Effort: Pulmonary effort is normal. No respiratory distress.     Breath sounds: No wheezing.     Comments: Decreased breath sound bilaterally. Abdominal:     General: Bowel sounds are normal. There is no distension.     Palpations: Abdomen is soft. There is no mass.     Tenderness: There is no abdominal tenderness.  Musculoskeletal: Normal range of motion.        General: No deformity.  Skin:    General: Skin is warm and dry.     Findings: No erythema or rash.  Neurological:     Mental Status: He is alert and oriented to person, place, and time.     Cranial Nerves: No cranial nerve deficit.     Coordination: Coordination normal.  Psychiatric:        Behavior: Behavior normal.        Thought Content: Thought content normal.     LABORATORY DATA:  I have reviewed the data as listed Lab Results  Component Value Date   WBC 3.7 (L) 12/13/2018   HGB 14.6 12/13/2018   HCT 43.5 12/13/2018   MCV 89.3 12/13/2018   PLT 231 12/13/2018   Recent Labs    10/25/18 1045 11/15/18 1002 12/13/18 0925   NA 140 141 143  K 3.5 3.6 3.5  CL 102 106 107  CO2 28 27 27   GLUCOSE 120* 123* 81  BUN 8 12 7*  CREATININE 0.69 0.77 0.66  CALCIUM 9.6 9.7 9.4  GFRNONAA >  60 >60 >60  GFRAA >60 >60 >60  PROT 8.1 8.4* 7.6  ALBUMIN 3.5 3.8 3.6  AST 51* 74* 60*  ALT 48* 63* 57*  ALKPHOS 87 88 75  BILITOT 0.6 0.6 0.5   Iron/TIBC/Ferritin/ %Sat No results found for: IRON, TIBC, FERRITIN, IRONPCTSAT   RADIOGRAPHIC STUDIES: I have personally reviewed the radiological images as listed and agreed with the findings in the report.  Ct Chest W Contrast  Result Date: 11/29/2018 CLINICAL DATA:  Hepatocellular carcinoma. Left upper lobe lung cancer. EXAM: CT CHEST, ABDOMEN, AND PELVIS WITH CONTRAST TECHNIQUE: Multidetector CT imaging of the chest, abdomen and pelvis was performed following the standard protocol during bolus administration of intravenous contrast. CONTRAST:  125mL OMNIPAQUE IOHEXOL 300 MG/ML  SOLN COMPARISON:  Multiple exams, including PET-CT from 07/06/2018 FINDINGS: CT CHEST FINDINGS Cardiovascular: Coronary, aortic arch, and branch vessel atherosclerotic vascular disease. Ascending thoracic aortic aneurysm 4.2 cm in diameter in the midthoracic aorta, image 28/2. Mediastinum/Nodes: Ill-defined periaortic nodularity along the descending thoracic aorta. Conglomerate nodularity measures 1.5 by 1.8 cm posterior to the aorta on image 43/2, previously 3.7 by 3.1 cm on 07/06/2018, previously more solid and sharply defined in appearance. Lungs/Pleura: Severe paraseptal and centrilobular emphysema. Previously hypermetabolic region of nodularity in the left upper lobe currently measures about 1.6 by 0.9 cm on image 41/4, previously 1.5 by 1.4 cm. There is also abnormal accentuated metabolic activity peripherally along the pleural margin in the left upper lobe, that pleural process currently measures 1.0 cm in thickness, previously 1.1 cm. There is surrounding scarring and architectural distortion in the left  upper lobe. Airway thickening is present, suggesting bronchitis or reactive airways disease. Musculoskeletal: Right degenerative glenohumeral arthropathy. Thoracic spondylosis. Lucency anteriorly in the T11 vertebral body measuring 1.2 by 1.0 cm on image 45/2 (formerly the same). This lesion was present but not significantly hypermetabolic on the prior PET-CT, although the adjacent adenopathy/mass in the paraspinal region did have some activity with maximum SUV of 5.0 previously. This lesion currently has a thin sclerotic rim, previously it did not. CT ABDOMEN PELVIS FINDINGS Hepatobiliary: A mildly complex 3.1 by 2.9 cm mostly hypodense lesion in the right hepatic lobe on image 54/2 appears unchanged from prior PET-CT and was not previously hypermetabolic. Gallstones are present in the gallbladder. No other significant patent lesions are identified. Pancreas: Unremarkable Spleen: Unremarkable Adrenals/Urinary Tract: Both adrenal glands appear normal. Probable cyst of the right mid kidney, 1.8 by 1.5 cm on image 21/7, similar appearance on 09/26/2014. Urinary bladder unremarkable. Stomach/Bowel: Unremarkable Vascular/Lymphatic: Aortoiliac atherosclerotic vascular disease. Reproductive: Unremarkable Other: No supplemental non-categorized findings. Musculoskeletal: Bridging spurring of the sacroiliac joints anteriorly. Grade 1 degenerative anterolisthesis at L5-S1 with lumbar spondylosis and degenerative disc disease causing impingement at all levels between L2 and S1, but especially at L5-S1. IMPRESSION: 1. Notable scarring and volume loss in the left upper lobe. Minimal reduction in size of the previously hypermetabolic left upper lobe nodule and of the adjacent mildly hypermetabolic left upper lobe pleural thickening. 2. Reduced size and definition of the periaortic adenopathy/mass. The adjacent T11 vertebral lucency, which may be from direct invasion, is unchanged in size but now has a sclerotic rim. 3. The right  hepatic lobe mildly complex lesion is unchanged in size and was not previously hypermetabolic. 4. Ascending thoracic aortic aneurysm 4.2 cm in diameter. Recommend annual imaging followup by CTA or MRA. This recommendation follows 2010 ACCF/AHA/AATS/ACR/ASA/SCA/SCAI/SIR/STS/SVM Guidelines for the Diagnosis and Management of Patients with Thoracic Aortic Disease. Circulation. 2010; 121: U132-G401.  Aortic aneurysm NOS (ICD10-I71.9) 5. Other imaging findings of potential clinical significance: Aortic Atherosclerosis (ICD10-I70.0) and Emphysema (ICD10-J43.9). Coronary atherosclerosis. Airway thickening is present, suggesting bronchitis or reactive airways disease. Multilevel lumbar impingement. Cholelithiasis. Electronically Signed   By: Van Clines M.D.   On: 11/29/2018 11:02   Ct Abdomen Pelvis W Contrast  Result Date: 11/29/2018 CLINICAL DATA:  Hepatocellular carcinoma. Left upper lobe lung cancer. EXAM: CT CHEST, ABDOMEN, AND PELVIS WITH CONTRAST TECHNIQUE: Multidetector CT imaging of the chest, abdomen and pelvis was performed following the standard protocol during bolus administration of intravenous contrast. CONTRAST:  157mL OMNIPAQUE IOHEXOL 300 MG/ML  SOLN COMPARISON:  Multiple exams, including PET-CT from 07/06/2018 FINDINGS: CT CHEST FINDINGS Cardiovascular: Coronary, aortic arch, and branch vessel atherosclerotic vascular disease. Ascending thoracic aortic aneurysm 4.2 cm in diameter in the midthoracic aorta, image 28/2. Mediastinum/Nodes: Ill-defined periaortic nodularity along the descending thoracic aorta. Conglomerate nodularity measures 1.5 by 1.8 cm posterior to the aorta on image 43/2, previously 3.7 by 3.1 cm on 07/06/2018, previously more solid and sharply defined in appearance. Lungs/Pleura: Severe paraseptal and centrilobular emphysema. Previously hypermetabolic region of nodularity in the left upper lobe currently measures about 1.6 by 0.9 cm on image 41/4, previously 1.5 by 1.4 cm.  There is also abnormal accentuated metabolic activity peripherally along the pleural margin in the left upper lobe, that pleural process currently measures 1.0 cm in thickness, previously 1.1 cm. There is surrounding scarring and architectural distortion in the left upper lobe. Airway thickening is present, suggesting bronchitis or reactive airways disease. Musculoskeletal: Right degenerative glenohumeral arthropathy. Thoracic spondylosis. Lucency anteriorly in the T11 vertebral body measuring 1.2 by 1.0 cm on image 45/2 (formerly the same). This lesion was present but not significantly hypermetabolic on the prior PET-CT, although the adjacent adenopathy/mass in the paraspinal region did have some activity with maximum SUV of 5.0 previously. This lesion currently has a thin sclerotic rim, previously it did not. CT ABDOMEN PELVIS FINDINGS Hepatobiliary: A mildly complex 3.1 by 2.9 cm mostly hypodense lesion in the right hepatic lobe on image 54/2 appears unchanged from prior PET-CT and was not previously hypermetabolic. Gallstones are present in the gallbladder. No other significant patent lesions are identified. Pancreas: Unremarkable Spleen: Unremarkable Adrenals/Urinary Tract: Both adrenal glands appear normal. Probable cyst of the right mid kidney, 1.8 by 1.5 cm on image 21/7, similar appearance on 09/26/2014. Urinary bladder unremarkable. Stomach/Bowel: Unremarkable Vascular/Lymphatic: Aortoiliac atherosclerotic vascular disease. Reproductive: Unremarkable Other: No supplemental non-categorized findings. Musculoskeletal: Bridging spurring of the sacroiliac joints anteriorly. Grade 1 degenerative anterolisthesis at L5-S1 with lumbar spondylosis and degenerative disc disease causing impingement at all levels between L2 and S1, but especially at L5-S1. IMPRESSION: 1. Notable scarring and volume loss in the left upper lobe. Minimal reduction in size of the previously hypermetabolic left upper lobe nodule and of the  adjacent mildly hypermetabolic left upper lobe pleural thickening. 2. Reduced size and definition of the periaortic adenopathy/mass. The adjacent T11 vertebral lucency, which may be from direct invasion, is unchanged in size but now has a sclerotic rim. 3. The right hepatic lobe mildly complex lesion is unchanged in size and was not previously hypermetabolic. 4. Ascending thoracic aortic aneurysm 4.2 cm in diameter. Recommend annual imaging followup by CTA or MRA. This recommendation follows 2010 ACCF/AHA/AATS/ACR/ASA/SCA/SCAI/SIR/STS/SVM Guidelines for the Diagnosis and Management of Patients with Thoracic Aortic Disease. Circulation. 2010; 121: Q683-M196. Aortic aneurysm NOS (ICD10-I71.9) 5. Other imaging findings of potential clinical significance: Aortic Atherosclerosis (ICD10-I70.0) and Emphysema (ICD10-J43.9). Coronary atherosclerosis. Airway  thickening is present, suggesting bronchitis or reactive airways disease. Multilevel lumbar impingement. Cholelithiasis. Electronically Signed   By: Van Clines M.D.   On: 11/29/2018 11:02      ASSESSMENT & PLAN:  1. Hepatocellular carcinoma metastatic to left lung ([place])   2. Primary lung adenocarcinoma, left ([place])   3. Encounter for antineoplastic chemotherapy   4. Cancer, metastatic to bone [facility])    #HCC, currently on lenvatinib since 09/07/2018.,  Patient tolerates well. AFP trended up tp 115, today's AFP trended down to 60. Marland Kitchen  #Lung adenocarcinoma cT3 Nx Mx Status post SBRT to the lung lesion. CT image was independent reviewed by me and discussed with patient. Scarring and volume loss of left upper lobe.  Minimal reduction in size of previously hypermetabolic left upper lobe nodules Reduced size and definition of.  Aortic adenopathy/mass. Sclerotic rim of T11 vertebral lucency. Right hepatic lobe mildly complex lesions unchanged.  Chronic ascending thoracic aorta aneurysm 4.2 cm. #Transaminitis, stable.  Continue to monitor.  #Recommend  bisphosphonate treatments. Follow-up in 4 weeks..  Orders Placed This Encounter  Procedures   CBC with Differential/Platelet    Standing Status:   Future    Standing Expiration Date:   12/13/2019   Comprehensive metabolic panel    Standing Status:   Future    Standing Expiration Date:   12/13/2019    All questions were answered. The patient knows to call the clinic with any problems questions or concerns. Return of visit:  4 weeks.    Earlie Server, MD, PhD 12/16/2018

## 2018-12-18 ENCOUNTER — Telehealth: Payer: Self-pay

## 2018-12-18 NOTE — Telephone Encounter (Signed)
Patient notified of Lab results. He states he does not have teeth, he wears dentures and he has not seen dentist in a while. The dentist he used to go to closed. Per Dr.Yu , schedule pt for zometa.

## 2018-12-18 NOTE — Telephone Encounter (Signed)
-----   Message from Earlie Server, MD sent at 12/16/2018 12:08 PM EDT ----- Please let patient know that his cancer marker has trended down, which is sign of good response.  Also please ask him to obtain dental clearance for bisphosphonate treatments.  Thank you

## 2018-12-22 NOTE — Progress Notes (Signed)
PSN spoke with patient today.  Patient given information on how to apply for financial assistance, through [facility], for his medical bills.  Patient lives with his girlfriend, but her income has been cut due to [place]. Patient receives a Airline pilot.  Patient will receive assistance from the [facility] to help with montly expenses.

## 2019-01-05 ENCOUNTER — Other Ambulatory Visit: Payer: Self-pay | Admitting: Oncology

## 2019-01-08 ENCOUNTER — Telehealth: Payer: Self-pay | Admitting: Pharmacy Technician

## 2019-01-08 NOTE — Telephone Encounter (Signed)
Oral Oncology Patient Advocate Encounter   Was successful in securing patient a $13,500 grant from [employer] ([employer]) to provide copayment coverage for Lenvima.  This will keep the out of pocket expense at $0.     I have spoken with the patient.    The billing information is as follows and has been shared with [place].   RxBin: [id number] PCN:  [id number] Member ID: [id number] Group ID: [id number] Dates of Eligibility:  01/08/2019 through 01/08/2020  We will transition the patient to [place] for 2021.  [name] [name] Phone [phone] Fax [phone] 01/08/2019 11:15 AM

## 2019-01-15 ENCOUNTER — Inpatient Hospital Stay: Payer: Medicare Other | Attending: Oncology

## 2019-01-15 ENCOUNTER — Other Ambulatory Visit: Payer: Self-pay

## 2019-01-15 ENCOUNTER — Inpatient Hospital Stay (HOSPITAL_BASED_OUTPATIENT_CLINIC_OR_DEPARTMENT_OTHER): Payer: Medicare Other | Admitting: Oncology

## 2019-01-15 ENCOUNTER — Encounter: Payer: Self-pay | Admitting: Oncology

## 2019-01-15 ENCOUNTER — Inpatient Hospital Stay: Payer: Medicare Other

## 2019-01-15 VITALS — BP 131/85 | HR 90 | Temp 96.3°F | Resp 18 | Wt 145.3 lb

## 2019-01-15 DIAGNOSIS — C7802 Secondary malignant neoplasm of left lung: Secondary | ICD-10-CM

## 2019-01-15 DIAGNOSIS — R7401 Elevation of levels of liver transaminase levels: Secondary | ICD-10-CM | POA: Diagnosis not present

## 2019-01-15 DIAGNOSIS — C7951 Secondary malignant neoplasm of bone: Secondary | ICD-10-CM | POA: Diagnosis not present

## 2019-01-15 DIAGNOSIS — G9589 Other specified diseases of spinal cord: Secondary | ICD-10-CM | POA: Insufficient documentation

## 2019-01-15 DIAGNOSIS — Z87891 Personal history of nicotine dependence: Secondary | ICD-10-CM | POA: Insufficient documentation

## 2019-01-15 DIAGNOSIS — C3492 Malignant neoplasm of unspecified part of left bronchus or lung: Secondary | ICD-10-CM | POA: Diagnosis not present

## 2019-01-15 DIAGNOSIS — B1920 Unspecified viral hepatitis C without hepatic coma: Secondary | ICD-10-CM | POA: Diagnosis not present

## 2019-01-15 DIAGNOSIS — M25512 Pain in left shoulder: Secondary | ICD-10-CM | POA: Diagnosis not present

## 2019-01-15 DIAGNOSIS — Z5111 Encounter for antineoplastic chemotherapy: Secondary | ICD-10-CM | POA: Diagnosis not present

## 2019-01-15 DIAGNOSIS — Z809 Family history of malignant neoplasm, unspecified: Secondary | ICD-10-CM | POA: Diagnosis not present

## 2019-01-15 DIAGNOSIS — J948 Other specified pleural conditions: Secondary | ICD-10-CM | POA: Insufficient documentation

## 2019-01-15 DIAGNOSIS — I712 Thoracic aortic aneurysm, without rupture: Secondary | ICD-10-CM | POA: Diagnosis not present

## 2019-01-15 DIAGNOSIS — R0602 Shortness of breath: Secondary | ICD-10-CM | POA: Diagnosis not present

## 2019-01-15 DIAGNOSIS — C220 Liver cell carcinoma: Secondary | ICD-10-CM

## 2019-01-15 DIAGNOSIS — Z79899 Other long term (current) drug therapy: Secondary | ICD-10-CM | POA: Insufficient documentation

## 2019-01-15 LAB — CBC WITH DIFFERENTIAL/PLATELET
Abs Immature Granulocytes: 0.01 K/uL (ref 0.00–0.07)
Basophils Absolute: 0 K/uL (ref 0.0–0.1)
Basophils Relative: 1 %
Eosinophils Absolute: 0.2 K/uL (ref 0.0–0.5)
Eosinophils Relative: 4 %
HCT: 45.4 % (ref 39.0–52.0)
Hemoglobin: 14.9 g/dL (ref 13.0–17.0)
Immature Granulocytes: 0 %
Lymphocytes Relative: 29 %
Lymphs Abs: 1.2 K/uL (ref 0.7–4.0)
MCH: 30.5 pg (ref 26.0–34.0)
MCHC: 32.8 g/dL (ref 30.0–36.0)
MCV: 93 fL (ref 80.0–100.0)
Monocytes Absolute: 0.5 K/uL (ref 0.1–1.0)
Monocytes Relative: 11 %
Neutro Abs: 2.3 K/uL (ref 1.7–7.7)
Neutrophils Relative %: 55 %
Platelets: 235 K/uL (ref 150–400)
RBC: 4.88 MIL/uL (ref 4.22–5.81)
RDW: 17 % — ABNORMAL HIGH (ref 11.5–15.5)
WBC: 4.2 K/uL (ref 4.0–10.5)
nRBC: 0 % (ref 0.0–0.2)

## 2019-01-15 LAB — COMPREHENSIVE METABOLIC PANEL WITH GFR
ALT: 39 U/L (ref 0–44)
AST: 51 U/L — ABNORMAL HIGH (ref 15–41)
Albumin: 3.3 g/dL — ABNORMAL LOW (ref 3.5–5.0)
Alkaline Phosphatase: 72 U/L (ref 38–126)
Anion gap: 8 (ref 5–15)
BUN: 9 mg/dL (ref 8–23)
CO2: 28 mmol/L (ref 22–32)
Calcium: 9.2 mg/dL (ref 8.9–10.3)
Chloride: 105 mmol/L (ref 98–111)
Creatinine, Ser: 0.76 mg/dL (ref 0.61–1.24)
GFR calc Af Amer: >60 mL/min
GFR calc non Af Amer: >60 mL/min
Glucose, Bld: 112 mg/dL — ABNORMAL HIGH (ref 70–99)
Potassium: 3.5 mmol/L (ref 3.5–5.1)
Sodium: 141 mmol/L (ref 135–145)
Total Bilirubin: 0.6 mg/dL (ref 0.3–1.2)
Total Protein: 7.5 g/dL (ref 6.5–8.1)

## 2019-01-15 MED ORDER — ZOLEDRONIC ACID 4 MG/100ML IV SOLN
4.0000 mg | Freq: Once | INTRAVENOUS | Status: AC
  Administered 2019-01-15: 4 mg via INTRAVENOUS
  Filled 2019-01-15: qty 100

## 2019-01-15 MED ORDER — SODIUM CHLORIDE 0.9 % IV SOLN
Freq: Once | INTRAVENOUS | Status: AC
  Administered 2019-01-15: 11:00:00 via INTRAVENOUS
  Filled 2019-01-15: qty 250

## 2019-01-15 MED ORDER — CALCIUM 1200 1200-1000 MG-UNIT PO CHEW: 1200.0000 mg | CHEWABLE_TABLET | Freq: Every day | ORAL | 0 refills | Status: DC

## 2019-01-15 MED ORDER — CALCIUM 1000 + D 1000-800 MG-UNIT PO TABS: 1000.0000 mg | ORAL_TABLET | Freq: Every day | ORAL | 0 refills | Status: DC

## 2019-01-15 NOTE — Progress Notes (Signed)
Patient here for follow up. States he is doing "pretty good."  No concerns voiced

## 2019-01-15 NOTE — Progress Notes (Signed)
Hematology/Oncology follow up note [facility] Telephone:([phone] [phone]   Patient Care Team: Paulene Floor as PCP - General (Physician Assistant) Telford Nab, RN as Registered Nurse Clent Jacks, RN as Registered Nurse  REFERRING PROVIDER: Trinna Post, PA-C  CHIEF COMPLAINTS/REASON FOR VISIT:  Discussion of metastatic cancer management.  HISTORY OF PRESENTING ILLNESS:   Samuel Saleeby. is a  68 y.o.  male with PMH listed below was seen in consultation at the request of  Terrilee Croak, Adriana M, PA-C  for evaluation of lung nodule in the liver lesion. Patient has a past medical history of hypertension, alcohol abuse, smoking emphysema.  He has had serial CTs done in the past.  CT images were independently reviewed by me. 07/26/2017 CT chest with contrast showed extensive pleural-parenchymal scarring within the left upper lobe with several areas of soft tissue nodularity measuring up to 1.6 cm.  In this patient who is at increased risk of lung cancer further investigation with PET scan is recommended. Small chronic appearance loculated hydropneumothorax overlies the left apex. Slowly enlarging lesion within the central portion of the right lobe of liver identified. Per note, patient supposed to have additional work-up done in December repeat CT scan which he did not  Patient was recently seen by primary care provider and has subsequent image done for follow-up. CT on 06/30/2018 showed multiple significantly enlarged paraspinal mass are noted concerning.  The largest measures 3.8 x 0.5 cm and there appears to be a lytic destruction of the adjacent T11 vertebral body.  Also noted is new solid density measuring 17 x 12 mm in the pleural parenchymal density in the left upper lobe noted on prior exam.  Concerning for malignancy.  4.4 ascending thoracic aortic aneurysm.  Recommend annual imaging.  Patient had PET scan done on 07/06/2018  Which showed there are 2 FDG avid lesion within the left upper lobe worrisome for primary lung neoplasm.  There is evidence of chest wall involvement.  Metastasis to the posterior mediastinum is identified with extension into T11 vertebra and possible involvement of the left T12 neural foramina.  Patient reports left shoulder pain.  Smoking half a pack a day not motivated with further questioning quitting Denies weight loss, hemoptysis, cough.  Chronic shortness of breath with exertion. He drinks alcohol daily. Lives with his girlfriend.  He has 5 adult kids  #Hepatitis C  # 6/1?2020  CT-guided biopsy of left-sided posterior mediastinal soft tissue adjacent to T10/11. Patient's case was discussed on tumor board. Consensus was reached that [facility] versus primary lung cancer with hepatoid adenocarcinoma features. Consensus was to proceed with liver biopsy first as liver mass was not FDG avid on PET scan.  Patient underwent liver biopsy and the present to discuss pathology reports.  ## 08/08/2018 Liver mass biopsy showed fragments of necrotic and calcified tissue with adjacent fibrous capsule Scant viable nonneoplastic liver tissue with steatosis, inflammation and non specific fibrosis.  # 08/28/2018 CT guided left upper lobe lung mass biopsy showed poorly differentiated adenocarcinoma of lung origin.  6/19-08/16/2018  Finished palliative radiation to paraspinal soft tissue mass. 09/07/2018 patient was started on lenvatinib 12 mg daily. 10/13/2018 SBRT to lung cancer lesion.  INTERVAL HISTORY Samuel Hood. is a 68 y.o. male who has above history reviewed by me today presents for follow up visit for assessment of tolerability of chemotherapy. Patient has been on lenvatinib 12 mg daily. Reports tolerating well.  No new complaints.  Appetite  is good.  He has gained weight.  Denies fever, chills, nausea, vomiting, diarrhea, chest pain, shortness of breath, abdominal pain, urinary symptoms, lower  extremity swelling.  Chronic fatigue is unchanged.   .  Review of Systems  Constitutional: Positive for fatigue. Negative for appetite change, chills, fever and unexpected weight change.  HENT:   Negative for hearing loss and voice change.   Eyes: Negative for eye problems and icterus.  Respiratory: Negative for chest tightness, cough and shortness of breath.   Cardiovascular: Negative for chest pain and leg swelling.  Gastrointestinal: Negative for abdominal distention, abdominal pain and blood in stool.  Endocrine: Negative for hot flashes.  Genitourinary: Negative for difficulty urinating, dysuria and frequency.   Musculoskeletal: Negative for arthralgias.  Skin: Negative for itching and rash.  Neurological: Negative for dizziness, extremity weakness, light-headedness and numbness.  Hematological: Negative for adenopathy. Does not bruise/bleed easily.  Psychiatric/Behavioral: Negative for confusion.    MEDICAL HISTORY:  Past Medical History:  Diagnosis Date  . Hepatocellular carcinoma metastatic to left lung ([place]) 07/24/2018  . History of angiography    left lower extremity  . Hypertension   . Small bowel obstruction ([place])     SURGICAL HISTORY: Past Surgical History:  Procedure Laterality Date  . COLON SURGERY    . COLONOSCOPY W/ POLYPECTOMY    . COLONOSCOPY WITH PROPOFOL N/A 01/31/2018   Procedure: COLONOSCOPY WITH PROPOFOL;  Surgeon: Toledo, Benay Pike, MD;  Location: [facility] ENDOSCOPY;  Service: Gastroenterology;  Laterality: N/A;  . debridement fasciotomy leg, left Left 03/19/2016  . LAPAROTOMY N/A 09/27/2014   Procedure: EXPLORATORY LAPAROTOMY;  Surgeon: Dia Crawford III, MD;  Location: [facility] ORS;  Service: General;  Laterality: N/A;    SOCIAL HISTORY: Social History   Socioeconomic History  . Marital status: Single    Spouse name: Not on file  . Number of children: Not on file  . Years of education: Not on file  . Highest education level: Not on file  Occupational  History  . Occupation: retired  Scientific laboratory technician  . Financial resource strain: Hard  . Food insecurity    Worry: Never true    Inability: Never true  . Transportation needs    Medical: No    Non-medical: No  Tobacco Use  . Smoking status: Former Smoker    Packs/day: 0.25    Years: 50.00    Pack years: 12.50  . Smokeless tobacco: Never Used  . Tobacco comment: 1-2/week  Substance and Sexual Activity  . Alcohol use: Yes    Alcohol/week: 35.0 standard drinks    Types: 30 Cans of beer, 5 Shots of liquor per week  . Drug use: No  . Sexual activity: Not on file  Lifestyle  . Physical activity    Days per week: 7 days    Minutes per session: 30 min  . Stress: Not at all  Relationships  . Social connections    Talks on phone: More than three times a week    Gets together: More than three times a week    Attends religious service: More than 4 times per year    Active member of club or organization: Not on file    Attends meetings of clubs or organizations: Not on file    Relationship status: Not on file  . Intimate partner violence    Fear of current or ex partner: Patient refused    Emotionally abused: No    Physically abused: No    Forced sexual activity:  No  Other Topics Concern  . Not on file  Social History Narrative  . Not on file    FAMILY HISTORY: Family History  Problem Relation Age of Onset  . Cancer Mother   . Cancer Father     ALLERGIES:  has No Known Allergies.  MEDICATIONS:  Current Outpatient Medications  Medication Sig Dispense Refill  . Acetaminophen (TYLENOL PO) Take by mouth.    . Lenvatinib 12 mg daily dose (LENVIMA) 3 x 4 MG capsule Take 12 mg by mouth daily. 90 capsule 0  . loperamide (IMODIUM) 2 MG capsule Take 1 capsule (2 mg total) by mouth See admin instructions. With onset of loose stool, take 4mg  followed by 2mg  every 2 hours until 12 hours have passed without loose bowel movement. Maximum: 16 mg/day 60 capsule 1  . Multiple Vitamin  (MULTIVITAMIN) capsule Take 1 capsule by mouth daily.    . polyethylene glycol (MIRALAX) 17 g packet Take 17 g by mouth daily. 21 each 0  . senna (SENOKOT) 8.6 MG TABS tablet Take 1 tablet (8.6 mg total) by mouth daily. 120 tablet 0  . Calcium Carbonate-Vit D-Min (CALCIUM 1200) 1200-1000 MG-UNIT CHEW Chew 1,200 mg by mouth daily. 90 tablet 0   No current facility-administered medications for this visit.      PHYSICAL EXAMINATION: ECOG PERFORMANCE STATUS: 1 - Symptomatic but completely ambulatory Vitals:   01/15/19 1028  BP: 131/85  Pulse: 90  Resp: 18  Temp: (!) 96.3 F (35.7 C)  SpO2: 100%   Filed Weights   01/15/19 1028  Weight: 145 lb 4.8 oz (65.9 kg)    Physical Exam Constitutional:      General: He is not in acute distress.    Comments: Thin built, walk independently  HENT:     Head: Normocephalic and atraumatic.  Eyes:     General: No scleral icterus.    Pupils: Pupils are equal, round, and reactive to light.  Neck:     Musculoskeletal: Normal range of motion and neck supple.  Cardiovascular:     Rate and Rhythm: Normal rate and regular rhythm.     Heart sounds: Normal heart sounds.  Pulmonary:     Effort: Pulmonary effort is normal. No respiratory distress.     Breath sounds: No wheezing.     Comments: Decreased breath sound bilaterally. Abdominal:     General: Bowel sounds are normal. There is no distension.     Palpations: Abdomen is soft. There is no mass.     Tenderness: There is no abdominal tenderness.  Musculoskeletal: Normal range of motion.        General: No deformity.  Skin:    General: Skin is warm and dry.     Findings: No erythema or rash.  Neurological:     Mental Status: He is alert and oriented to person, place, and time.     Cranial Nerves: No cranial nerve deficit.     Coordination: Coordination normal.  Psychiatric:        Behavior: Behavior normal.        Thought Content: Thought content normal.     LABORATORY DATA:  I have  reviewed the data as listed Lab Results  Component Value Date   WBC 4.2 01/15/2019   HGB 14.9 01/15/2019   HCT 45.4 01/15/2019   MCV 93.0 01/15/2019   PLT 235 01/15/2019   Recent Labs    11/15/18 1002 12/13/18 0925 01/15/19 0944  NA 141 143 141  K 3.6  3.5 3.5  CL 106 107 105  CO2 27 27 28   GLUCOSE 123* 81 112*  BUN 12 7* 9  CREATININE 0.77 0.66 0.76  CALCIUM 9.7 9.4 9.2  GFRNONAA >60 >60 >60  GFRAA >60 >60 >60  PROT 8.4* 7.6 7.5  ALBUMIN 3.8 3.6 3.3*  AST 74* 60* 51*  ALT 63* 57* 39  ALKPHOS 88 75 72  BILITOT 0.6 0.5 0.6   Iron/TIBC/Ferritin/ %Sat No results found for: IRON, TIBC, FERRITIN, IRONPCTSAT   RADIOGRAPHIC STUDIES: I have personally reviewed the radiological images as listed and agreed with the findings in the report.  No results found.    ASSESSMENT & PLAN:  1. Hepatocellular carcinoma metastatic to left lung ([place])   2. Primary lung adenocarcinoma, left ([place])   3. Encounter for antineoplastic chemotherapy   4. Cancer, metastatic to bone [facility])   I called patient's daughter know that and plan was discussed with both patient and daughter.   #HCC, currently on lenvatinib since 09/07/2018.,  Labs are reviewed and discussed with patient. Counts are stable. Continue current regimen. Tumor markerAFP has trended down to 60s.  Today's level is pending.   #Lung adenocarcinoma cT3 Nx Mx Status post SBRT to the lung lesion. Continue to monitor.  Repeat CT scan in January 2021.  #Sclerotic T11 lesion. Recommend bisphosphonate treatments. Discussed the potential side effects including but not limited to- hypocalcemia and ONJ; discussed not to have invasive dental procedures few weeks around the time of the injections.  Patient wears dentures.  He has no teeth.  Denies any pain. We will proceed with Zometa 4 mg today.  Recommend patient to start taking oral calcium/vitamin D supplementation. Patient and daughter voiced understanding and they agree with the  plan.  #Transaminitis, stable.  Trended down.  Continue to monitor.  Follow-up in 4 weeks..   All questions were answered. The patient knows to call the clinic with any problems questions or concerns. Return of visit:  4 weeks.    Earlie Server, MD, PhD 01/15/2019

## 2019-01-16 LAB — AFP TUMOR MARKER: AFP, Serum, Tumor Marker: 16.4 ng/mL — ABNORMAL HIGH (ref 0.0–8.3)

## 2019-01-17 ENCOUNTER — Other Ambulatory Visit: Payer: Self-pay

## 2019-01-18 ENCOUNTER — Inpatient Hospital Stay: Payer: Medicare Other | Attending: Oncology

## 2019-01-18 ENCOUNTER — Other Ambulatory Visit: Payer: Self-pay

## 2019-01-18 DIAGNOSIS — M899 Disorder of bone, unspecified: Secondary | ICD-10-CM | POA: Insufficient documentation

## 2019-01-18 DIAGNOSIS — R0602 Shortness of breath: Secondary | ICD-10-CM | POA: Insufficient documentation

## 2019-01-18 DIAGNOSIS — B1920 Unspecified viral hepatitis C without hepatic coma: Secondary | ICD-10-CM | POA: Insufficient documentation

## 2019-01-18 DIAGNOSIS — J439 Emphysema, unspecified: Secondary | ICD-10-CM | POA: Insufficient documentation

## 2019-01-18 DIAGNOSIS — R160 Hepatomegaly, not elsewhere classified: Secondary | ICD-10-CM | POA: Insufficient documentation

## 2019-01-18 DIAGNOSIS — I10 Essential (primary) hypertension: Secondary | ICD-10-CM | POA: Insufficient documentation

## 2019-01-18 DIAGNOSIS — I2510 Atherosclerotic heart disease of native coronary artery without angina pectoris: Secondary | ICD-10-CM | POA: Insufficient documentation

## 2019-01-18 DIAGNOSIS — R5383 Other fatigue: Secondary | ICD-10-CM | POA: Insufficient documentation

## 2019-01-18 DIAGNOSIS — C220 Liver cell carcinoma: Secondary | ICD-10-CM | POA: Insufficient documentation

## 2019-01-18 DIAGNOSIS — R7401 Elevation of levels of liver transaminase levels: Secondary | ICD-10-CM | POA: Insufficient documentation

## 2019-01-18 DIAGNOSIS — J948 Other specified pleural conditions: Secondary | ICD-10-CM | POA: Insufficient documentation

## 2019-01-18 DIAGNOSIS — F1010 Alcohol abuse, uncomplicated: Secondary | ICD-10-CM | POA: Insufficient documentation

## 2019-01-18 DIAGNOSIS — Z7289 Other problems related to lifestyle: Secondary | ICD-10-CM | POA: Insufficient documentation

## 2019-01-18 DIAGNOSIS — I700 Atherosclerosis of aorta: Secondary | ICD-10-CM | POA: Insufficient documentation

## 2019-01-18 DIAGNOSIS — Z79899 Other long term (current) drug therapy: Secondary | ICD-10-CM | POA: Insufficient documentation

## 2019-01-18 DIAGNOSIS — Z809 Family history of malignant neoplasm, unspecified: Secondary | ICD-10-CM | POA: Insufficient documentation

## 2019-01-18 DIAGNOSIS — I712 Thoracic aortic aneurysm, without rupture: Secondary | ICD-10-CM | POA: Insufficient documentation

## 2019-01-18 DIAGNOSIS — C7802 Secondary malignant neoplasm of left lung: Secondary | ICD-10-CM | POA: Insufficient documentation

## 2019-01-18 DIAGNOSIS — M25512 Pain in left shoulder: Secondary | ICD-10-CM | POA: Insufficient documentation

## 2019-01-18 DIAGNOSIS — Z87891 Personal history of nicotine dependence: Secondary | ICD-10-CM | POA: Insufficient documentation

## 2019-01-18 DIAGNOSIS — K8020 Calculus of gallbladder without cholecystitis without obstruction: Secondary | ICD-10-CM | POA: Insufficient documentation

## 2019-01-18 DIAGNOSIS — R599 Enlarged lymph nodes, unspecified: Secondary | ICD-10-CM | POA: Insufficient documentation

## 2019-01-18 DIAGNOSIS — M5136 Other intervertebral disc degeneration, lumbar region: Secondary | ICD-10-CM | POA: Insufficient documentation

## 2019-01-18 NOTE — Progress Notes (Signed)
Nutrition Follow-up:  Patient with hepatocellular metastatic to left lung.  Patient taking lenvatinib and adding zometa  Met with patient in clinic this am.  Patient reports that appetite is doing well.  Ate ham and egg on [place] bread with mayo for breakfast yesterday mid morning and ate hamburger and honey bun for dinner last night.  Drinking 2 ensure per day.  Eating mainly meat sandwiches, snacks on pimento cheese and crackers.  Girlfriend cooks mostly supper meals (hamburger with noodles tonight).    Denies any nutrition impact symptoms at this time  Medications: calcium/Vit D added   Labs: reviewed  Anthropometrics:   Weight 142 lb today in clinic, noted 145 lb on 11/30.  Increased from 141 lb on 10/28  NUTRITION DIAGNOSIS:  Inadequate oral intake improved   INTERVENTION:  Provided patient with another complimentary case of ensure enlive. Encouraged good sources of protein at every meal. Patient has contact information    MONITORING, EVALUATION, GOAL: Patient will consume adequate calories and protein to prevent weight from decreasing.    NEXT VISIT: Monday, Dec 28 during infusion  Joli B. Allen, RD, LDN Registered Dietitian [phone] (pager)     

## 2019-02-12 ENCOUNTER — Encounter: Payer: Self-pay | Admitting: Oncology

## 2019-02-12 ENCOUNTER — Other Ambulatory Visit: Payer: Self-pay

## 2019-02-12 ENCOUNTER — Inpatient Hospital Stay (HOSPITAL_BASED_OUTPATIENT_CLINIC_OR_DEPARTMENT_OTHER): Payer: Medicare Other | Admitting: Oncology

## 2019-02-12 ENCOUNTER — Inpatient Hospital Stay: Payer: Medicare Other

## 2019-02-12 VITALS — BP 136/99 | HR 88 | Temp 97.4°F | Resp 18 | Wt 148.0 lb

## 2019-02-12 DIAGNOSIS — C7802 Secondary malignant neoplasm of left lung: Secondary | ICD-10-CM | POA: Diagnosis not present

## 2019-02-12 DIAGNOSIS — J439 Emphysema, unspecified: Secondary | ICD-10-CM | POA: Diagnosis not present

## 2019-02-12 DIAGNOSIS — R7401 Elevation of levels of liver transaminase levels: Secondary | ICD-10-CM

## 2019-02-12 DIAGNOSIS — C220 Liver cell carcinoma: Secondary | ICD-10-CM

## 2019-02-12 DIAGNOSIS — Z809 Family history of malignant neoplasm, unspecified: Secondary | ICD-10-CM | POA: Diagnosis not present

## 2019-02-12 DIAGNOSIS — I712 Thoracic aortic aneurysm, without rupture: Secondary | ICD-10-CM | POA: Diagnosis not present

## 2019-02-12 DIAGNOSIS — R5383 Other fatigue: Secondary | ICD-10-CM | POA: Diagnosis not present

## 2019-02-12 DIAGNOSIS — M25512 Pain in left shoulder: Secondary | ICD-10-CM | POA: Diagnosis not present

## 2019-02-12 DIAGNOSIS — C7951 Secondary malignant neoplasm of bone: Secondary | ICD-10-CM

## 2019-02-12 DIAGNOSIS — J948 Other specified pleural conditions: Secondary | ICD-10-CM | POA: Diagnosis not present

## 2019-02-12 DIAGNOSIS — M5136 Other intervertebral disc degeneration, lumbar region: Secondary | ICD-10-CM | POA: Diagnosis not present

## 2019-02-12 DIAGNOSIS — C3492 Malignant neoplasm of unspecified part of left bronchus or lung: Secondary | ICD-10-CM

## 2019-02-12 DIAGNOSIS — I10 Essential (primary) hypertension: Secondary | ICD-10-CM | POA: Diagnosis not present

## 2019-02-12 DIAGNOSIS — I700 Atherosclerosis of aorta: Secondary | ICD-10-CM | POA: Diagnosis not present

## 2019-02-12 DIAGNOSIS — Z5111 Encounter for antineoplastic chemotherapy: Secondary | ICD-10-CM

## 2019-02-12 DIAGNOSIS — Z79899 Other long term (current) drug therapy: Secondary | ICD-10-CM | POA: Diagnosis not present

## 2019-02-12 DIAGNOSIS — R599 Enlarged lymph nodes, unspecified: Secondary | ICD-10-CM | POA: Diagnosis not present

## 2019-02-12 DIAGNOSIS — B1920 Unspecified viral hepatitis C without hepatic coma: Secondary | ICD-10-CM | POA: Diagnosis not present

## 2019-02-12 DIAGNOSIS — K8020 Calculus of gallbladder without cholecystitis without obstruction: Secondary | ICD-10-CM | POA: Diagnosis not present

## 2019-02-12 DIAGNOSIS — R0602 Shortness of breath: Secondary | ICD-10-CM | POA: Diagnosis not present

## 2019-02-12 DIAGNOSIS — Z87891 Personal history of nicotine dependence: Secondary | ICD-10-CM | POA: Diagnosis not present

## 2019-02-12 DIAGNOSIS — M899 Disorder of bone, unspecified: Secondary | ICD-10-CM

## 2019-02-12 DIAGNOSIS — F1010 Alcohol abuse, uncomplicated: Secondary | ICD-10-CM | POA: Diagnosis not present

## 2019-02-12 DIAGNOSIS — I2510 Atherosclerotic heart disease of native coronary artery without angina pectoris: Secondary | ICD-10-CM | POA: Diagnosis not present

## 2019-02-12 DIAGNOSIS — Z7289 Other problems related to lifestyle: Secondary | ICD-10-CM | POA: Diagnosis not present

## 2019-02-12 DIAGNOSIS — R160 Hepatomegaly, not elsewhere classified: Secondary | ICD-10-CM | POA: Diagnosis not present

## 2019-02-12 LAB — COMPREHENSIVE METABOLIC PANEL
ALT: 31 U/L (ref 0–44)
AST: 45 U/L — ABNORMAL HIGH (ref 15–41)
Albumin: 3.6 g/dL (ref 3.5–5.0)
Alkaline Phosphatase: 78 U/L (ref 38–126)
Anion gap: 6 (ref 5–15)
BUN: 9 mg/dL (ref 8–23)
CO2: 27 mmol/L (ref 22–32)
Calcium: 9 mg/dL (ref 8.9–10.3)
Chloride: 108 mmol/L (ref 98–111)
Creatinine, Ser: 0.76 mg/dL (ref 0.61–1.24)
GFR calc Af Amer: >60 mL/min (ref >60)
GFR calc non Af Amer: >60 mL/min (ref >60)
Glucose, Bld: 103 mg/dL — ABNORMAL HIGH (ref 70–99)
Potassium: 4 mmol/L (ref 3.5–5.1)
Sodium: 141 mmol/L (ref 135–145)
Total Bilirubin: 0.5 mg/dL (ref 0.3–1.2)
Total Protein: 7.9 g/dL (ref 6.5–8.1)

## 2019-02-12 LAB — CBC WITH DIFFERENTIAL/PLATELET
Abs Immature Granulocytes: 0.03 10*3/uL (ref 0.00–0.07)
Basophils Absolute: 0 10*3/uL (ref 0.0–0.1)
Basophils Relative: 1 %
Eosinophils Absolute: 0.2 10*3/uL (ref 0.0–0.5)
Eosinophils Relative: 4 %
HCT: 46.1 % (ref 39.0–52.0)
Hemoglobin: 14.9 g/dL (ref 13.0–17.0)
Immature Granulocytes: 1 %
Lymphocytes Relative: 22 %
Lymphs Abs: 1.2 10*3/uL (ref 0.7–4.0)
MCH: 30.3 pg (ref 26.0–34.0)
MCHC: 32.3 g/dL (ref 30.0–36.0)
MCV: 93.7 fL (ref 80.0–100.0)
Monocytes Absolute: 0.6 10*3/uL (ref 0.1–1.0)
Monocytes Relative: 12 %
Neutro Abs: 3.1 10*3/uL (ref 1.7–7.7)
Neutrophils Relative %: 60 %
Platelets: 259 10*3/uL (ref 150–400)
RBC: 4.92 MIL/uL (ref 4.22–5.81)
RDW: 16.4 % — ABNORMAL HIGH (ref 11.5–15.5)
WBC: 5.2 10*3/uL (ref 4.0–10.5)
nRBC: 0 % (ref 0.0–0.2)

## 2019-02-12 MED ORDER — SODIUM CHLORIDE 0.9 % IV SOLN
Freq: Once | INTRAVENOUS | Status: AC
  Filled 2019-02-12: qty 250

## 2019-02-12 MED ORDER — ZOLEDRONIC ACID 4 MG/100ML IV SOLN
4.0000 mg | Freq: Once | INTRAVENOUS | Status: AC
  Administered 2019-02-12: 4 mg via INTRAVENOUS
  Filled 2019-02-12: qty 100

## 2019-02-12 NOTE — Progress Notes (Signed)
Hematology/Oncology follow up note [facility] Telephone:(336) [phone] Fax:([phone]) [phone]   Patient Care Team: Paulene Floor as PCP - General (Physician Assistant) Telford Nab, RN as Registered Nurse Samuel Jacks, RN as Registered Nurse  REFERRING PROVIDER: Trinna Post, PA-C  CHIEF COMPLAINTS/REASON FOR VISIT:  Discussion of metastatic cancer management.  HISTORY OF PRESENTING ILLNESS:   Samuel Hood. is a  68 y.o.  male with PMH listed below was seen in consultation at the request of  Terrilee Croak, Adriana M, PA-C  for evaluation of lung nodule in the liver lesion. Patient has a past medical history of hypertension, alcohol abuse, smoking emphysema.  He has had serial CTs done in the past.  CT images were independently reviewed by me. 07/26/2017 CT chest with contrast showed extensive pleural-parenchymal scarring within the left upper lobe with several areas of soft tissue nodularity measuring up to 1.6 cm.  In this patient who is at increased risk of lung cancer further investigation with PET scan is recommended. Small chronic appearance loculated hydropneumothorax overlies the left apex. Slowly enlarging lesion within the central portion of the right lobe of liver identified. Per note, patient supposed to have additional work-up done in December repeat CT scan which he did not  Patient was recently seen by primary care provider and has subsequent image done for follow-up. CT on 06/30/2018 showed multiple significantly enlarged paraspinal mass are noted concerning.  The largest measures 3.8 x 0.5 cm and there appears to be a lytic destruction of the adjacent T11 vertebral body.  Also noted is new solid density measuring 17 x 12 mm in the pleural parenchymal density in the left upper lobe noted on prior exam.  Concerning for malignancy.  4.4 ascending thoracic aortic aneurysm.  Recommend annual imaging.  Patient had PET scan done on  07/06/2018 Which showed there are 2 FDG avid lesion within the left upper lobe worrisome for primary lung neoplasm.  There is evidence of chest wall involvement.  Metastasis to the posterior mediastinum is identified with extension into T11 vertebra and possible involvement of the left T12 neural foramina.  Patient reports left shoulder pain.  Smoking half a pack a day not motivated with further questioning quitting Denies weight loss, hemoptysis, cough.  Chronic shortness of breath with exertion. He drinks alcohol daily. Lives with his girlfriend.  He has 5 adult kids  #Hepatitis C  # 6/1?2020  CT-guided biopsy of left-sided posterior mediastinal soft tissue adjacent to T10/11. Patient's case was discussed on tumor board. Consensus was reached that [facility] versus primary lung cancer with hepatoid adenocarcinoma features. Consensus was to proceed with liver biopsy first as liver mass was not FDG avid on PET scan.  Patient underwent liver biopsy and the present to discuss pathology reports.  ## 08/08/2018 Liver mass biopsy showed fragments of necrotic and calcified tissue with adjacent fibrous capsule Scant viable nonneoplastic liver tissue with steatosis, inflammation and non specific fibrosis.  # 08/28/2018 CT guided left upper lobe lung mass biopsy showed poorly differentiated adenocarcinoma of lung origin.  6/19-08/16/2018  Finished palliative radiation to paraspinal soft tissue mass. 09/07/2018 patient was started on lenvatinib 12 mg daily. 10/13/2018 SBRT to lung cancer lesion.  INTERVAL HISTORY [name]. is a 68 y.o. male who has above history reviewed by me today presents for follow up visit for assessment of tolerability of chemotherapy. Patient has been on lenvatinib 12 mg daily\ Patient reports being compliant and tolerated the regimen. Appetite  is good.  He has gained 3 pounds since last visit.  #He was advised to take calcium supplement.  He reports he quit taking calcium as he  broke out rash on her scalp, neck and chest after taking calcium. Otherwise he has no new complaints. Denies any fever, chills, nausea, vomiting, diarrhea, chest pain, shortness of breath, abdominal pain, urinary symptoms.    Review of Systems  Constitutional: Positive for fatigue. Negative for appetite change, chills, fever and unexpected weight change.  HENT:   Negative for hearing loss and voice change.   Eyes: Negative for eye problems and icterus.  Respiratory: Negative for chest tightness, cough and shortness of breath.   Cardiovascular: Negative for chest pain and leg swelling.  Gastrointestinal: Negative for abdominal distention, abdominal pain and blood in stool.  Endocrine: Negative for hot flashes.  Genitourinary: Negative for difficulty urinating, dysuria and frequency.   Musculoskeletal: Negative for arthralgias.  Skin: Negative for itching and rash.  Neurological: Negative for dizziness, extremity weakness, light-headedness and numbness.  Hematological: Negative for adenopathy. Does not bruise/bleed easily.  Psychiatric/Behavioral: Negative for confusion.    MEDICAL HISTORY:  Past Medical History:  Diagnosis Date  . Hepatocellular carcinoma metastatic to left lung ([place]) 07/24/2018  . History of angiography    left lower extremity  . Hypertension   . Small bowel obstruction ([place])     SURGICAL HISTORY: Past Surgical History:  Procedure Laterality Date  . COLON SURGERY    . COLONOSCOPY W/ POLYPECTOMY    . COLONOSCOPY WITH PROPOFOL N/A 01/31/2018   Procedure: COLONOSCOPY WITH PROPOFOL;  Surgeon: Toledo, Benay Pike, MD;  Location: [facility] ENDOSCOPY;  Service: Gastroenterology;  Laterality: N/A;  . debridement fasciotomy leg, left Left 03/19/2016  . LAPAROTOMY N/A 09/27/2014   Procedure: EXPLORATORY LAPAROTOMY;  Surgeon: Dia Crawford III, MD;  Location: [facility] ORS;  Service: General;  Laterality: N/A;    SOCIAL HISTORY: Social History   Socioeconomic History  . Marital  status: Single    Spouse name: Not on file  . Number of children: Not on file  . Years of education: Not on file  . Highest education level: Not on file  Occupational History  . Occupation: retired  Tobacco Use  . Smoking status: Former Smoker    Packs/day: 0.25    Years: 50.00    Pack years: 12.50  . Smokeless tobacco: Never Used  . Tobacco comment: 1-2/week  Substance and Sexual Activity  . Alcohol use: Yes    Alcohol/week: 35.0 standard drinks    Types: 30 Cans of beer, 5 Shots of liquor per week  . Drug use: No  . Sexual activity: Not on file  Other Topics Concern  . Not on file  Social History Narrative  . Not on file   Social Determinants of Health   Financial Resource Strain: High Risk  . Difficulty of Paying Living Expenses: Hard  Food Insecurity: No Food Insecurity  . Worried About Charity fundraiser in the Last Year: Never true  . Ran Out of Food in the Last Year: Never true  Transportation Needs: No Transportation Needs  . Lack of Transportation (Medical): No  . Lack of Transportation (Non-Medical): No  Physical Activity: Sufficiently Active  . Days of Exercise per Week: 7 days  . Minutes of Exercise per Session: 30 min  Stress: No Stress Concern Present  . Feeling of Stress : Not at all  Social Connections: Unknown  . Frequency of Communication with Friends and Family: More than  three times a week  . Frequency of Social Gatherings with Friends and Family: More than three times a week  . Attends Religious Services: More than 4 times per year  . Active Member of Clubs or Organizations: Not on file  . Attends Archivist Meetings: Not on file  . Marital Status: Not on file  Intimate Partner Violence: Unknown  . Fear of Current or Ex-Partner: Patient refused  . Emotionally Abused: No  . Physically Abused: No  . Sexually Abused: No    FAMILY HISTORY: Family History  Problem Relation Age of Onset  . Cancer Mother   . Cancer Father      ALLERGIES:  has No Known Allergies.  MEDICATIONS:  Current Outpatient Medications  Medication Sig Dispense Refill  . Lenvatinib 12 mg daily dose (LENVIMA) 3 x 4 MG capsule Take 12 mg by mouth daily. 90 capsule 0  . loperamide (IMODIUM) 2 MG capsule Take 1 capsule (2 mg total) by mouth See admin instructions. With onset of loose stool, take 4mg  followed by 2mg  every 2 hours until 12 hours have passed without loose bowel movement. Maximum: 16 mg/day 60 capsule 1  . Multiple Vitamin (MULTIVITAMIN) capsule Take 1 capsule by mouth daily.    . Acetaminophen (TYLENOL PO) Take by mouth.    . Calcium Carbonate-Vit D-Min (CALCIUM 1200) 1200-1000 MG-UNIT CHEW Chew 1,200 mg by mouth daily. (Patient not taking: Reported on 02/12/2019) 90 tablet 0  . polyethylene glycol (MIRALAX) 17 g packet Take 17 g by mouth daily. (Patient not taking: Reported on 02/12/2019) 21 each 0  . senna (SENOKOT) 8.6 MG TABS tablet Take 1 tablet (8.6 mg total) by mouth daily. (Patient not taking: Reported on 02/12/2019) 120 tablet 0   No current facility-administered medications for this visit.     PHYSICAL EXAMINATION: ECOG PERFORMANCE STATUS: 1 - Symptomatic but completely ambulatory Vitals:   02/12/19 1036  BP: (!) 136/99  Pulse: 88  Resp: 18  Temp: (!) 97.4 F (36.3 C)  SpO2: 100%   Filed Weights   02/12/19 1036  Weight: 148 lb (67.1 kg)    Physical Exam Constitutional:      General: He is not in acute distress.    Comments: Thin built, walk independently  HENT:     Head: Normocephalic and atraumatic.  Eyes:     General: No scleral icterus.    Pupils: Pupils are equal, round, and reactive to light.  Cardiovascular:     Rate and Rhythm: Normal rate and regular rhythm.     Heart sounds: Normal heart sounds.  Pulmonary:     Effort: Pulmonary effort is normal. No respiratory distress.     Breath sounds: No wheezing.     Comments: Decreased breath sound bilaterally. Abdominal:     General: Bowel  sounds are normal. There is no distension.     Palpations: Abdomen is soft. There is no mass.     Tenderness: There is no abdominal tenderness.  Musculoskeletal:        General: No deformity. Normal range of motion.     Cervical back: Normal range of motion and neck supple.  Skin:    General: Skin is warm and dry.     Findings: No erythema or rash.  Neurological:     Mental Status: He is alert and oriented to person, place, and time.     Cranial Nerves: No cranial nerve deficit.     Coordination: Coordination normal.  Psychiatric:  Behavior: Behavior normal.        Thought Content: Thought content normal.     LABORATORY DATA:  I have reviewed the data as listed Lab Results  Component Value Date   WBC 5.2 02/12/2019   HGB 14.9 02/12/2019   HCT 46.1 02/12/2019   MCV 93.7 02/12/2019   PLT 259 02/12/2019   Recent Labs    12/13/18 0925 01/15/19 0944 02/12/19 1102  NA 143 141 141  K 3.5 3.5 4.0  CL 107 105 108  CO2 27 28 27   GLUCOSE 81 112* 103*  BUN 7* 9 9  CREATININE 0.66 0.76 0.76  CALCIUM 9.4 9.2 9.0  GFRNONAA >60 >60 >60  GFRAA >60 >60 >60  PROT 7.6 7.5 7.9  ALBUMIN 3.6 3.3* 3.6  AST 60* 51* 45*  ALT 57* 39 31  ALKPHOS 75 72 78  BILITOT 0.5 0.6 0.5   Iron/TIBC/Ferritin/ %Sat No results found for: IRON, TIBC, FERRITIN, IRONPCTSAT   RADIOGRAPHIC STUDIES: I have personally reviewed the radiological images as listed and agreed with the findings in the report. CT Chest W Contrast  Result Date: 11/29/2018 CLINICAL DATA:  Hepatocellular carcinoma. Left upper lobe lung cancer. EXAM: CT CHEST, ABDOMEN, AND PELVIS WITH CONTRAST TECHNIQUE: Multidetector CT imaging of the chest, abdomen and pelvis was performed following the standard protocol during bolus administration of intravenous contrast. CONTRAST:  166mL OMNIPAQUE IOHEXOL 300 MG/ML  SOLN COMPARISON:  Multiple exams, including PET-CT from 07/06/2018 FINDINGS: CT CHEST FINDINGS Cardiovascular: Coronary, aortic  arch, and branch vessel atherosclerotic vascular disease. Ascending thoracic aortic aneurysm 4.2 cm in diameter in the midthoracic aorta, image 28/2. Mediastinum/Nodes: Ill-defined periaortic nodularity along the descending thoracic aorta. Conglomerate nodularity measures 1.5 by 1.8 cm posterior to the aorta on image 43/2, previously 3.7 by 3.1 cm on 07/06/2018, previously more solid and sharply defined in appearance. Lungs/Pleura: Severe paraseptal and centrilobular emphysema. Previously hypermetabolic region of nodularity in the left upper lobe currently measures about 1.6 by 0.9 cm on image 41/4, previously 1.5 by 1.4 cm. There is also abnormal accentuated metabolic activity peripherally along the pleural margin in the left upper lobe, that pleural process currently measures 1.0 cm in thickness, previously 1.1 cm. There is surrounding scarring and architectural distortion in the left upper lobe. Airway thickening is present, suggesting bronchitis or reactive airways disease. Musculoskeletal: Right degenerative glenohumeral arthropathy. Thoracic spondylosis. Lucency anteriorly in the T11 vertebral body measuring 1.2 by 1.0 cm on image 45/2 (formerly the same). This lesion was present but not significantly hypermetabolic on the prior PET-CT, although the adjacent adenopathy/mass in the paraspinal region did have some activity with maximum SUV of 5.0 previously. This lesion currently has a thin sclerotic rim, previously it did not. CT ABDOMEN PELVIS FINDINGS Hepatobiliary: A mildly complex 3.1 by 2.9 cm mostly hypodense lesion in the right hepatic lobe on image 54/2 appears unchanged from prior PET-CT and was not previously hypermetabolic. Gallstones are present in the gallbladder. No other significant patent lesions are identified. Pancreas: Unremarkable Spleen: Unremarkable Adrenals/Urinary Tract: Both adrenal glands appear normal. Probable cyst of the right mid kidney, 1.8 by 1.5 cm on image 21/7, similar  appearance on 09/26/2014. Urinary bladder unremarkable. Stomach/Bowel: Unremarkable Vascular/Lymphatic: Aortoiliac atherosclerotic vascular disease. Reproductive: Unremarkable Other: No supplemental non-categorized findings. Musculoskeletal: Bridging spurring of the sacroiliac joints anteriorly. Grade 1 degenerative anterolisthesis at L5-S1 with lumbar spondylosis and degenerative disc disease causing impingement at all levels between L2 and S1, but especially at L5-S1. IMPRESSION: 1. Notable scarring and volume  loss in the left upper lobe. Minimal reduction in size of the previously hypermetabolic left upper lobe nodule and of the adjacent mildly hypermetabolic left upper lobe pleural thickening. 2. Reduced size and definition of the periaortic adenopathy/mass. The adjacent T11 vertebral lucency, which may be from direct invasion, is unchanged in size but now has a sclerotic rim. 3. The right hepatic lobe mildly complex lesion is unchanged in size and was not previously hypermetabolic. 4. Ascending thoracic aortic aneurysm 4.2 cm in diameter. Recommend annual imaging followup by CTA or MRA. This recommendation follows 2010 ACCF/AHA/AATS/ACR/ASA/SCA/SCAI/SIR/STS/SVM Guidelines for the Diagnosis and Management of Patients with Thoracic Aortic Disease. Circulation. 2010; 121: T654-Y503. Aortic aneurysm NOS (ICD10-I71.9) 5. Other imaging findings of potential clinical significance: Aortic Atherosclerosis (ICD10-I70.0) and Emphysema (ICD10-J43.9). Coronary atherosclerosis. Airway thickening is present, suggesting bronchitis or reactive airways disease. Multilevel lumbar impingement. Cholelithiasis. Electronically Signed   By: Van Clines M.D.   On: 11/29/2018 11:02   CT Abdomen Pelvis W Contrast  Result Date: 11/29/2018 CLINICAL DATA:  Hepatocellular carcinoma. Left upper lobe lung cancer. EXAM: CT CHEST, ABDOMEN, AND PELVIS WITH CONTRAST TECHNIQUE: Multidetector CT imaging of the chest, abdomen and pelvis  was performed following the standard protocol during bolus administration of intravenous contrast. CONTRAST:  153mL OMNIPAQUE IOHEXOL 300 MG/ML  SOLN COMPARISON:  Multiple exams, including PET-CT from 07/06/2018 FINDINGS: CT CHEST FINDINGS Cardiovascular: Coronary, aortic arch, and branch vessel atherosclerotic vascular disease. Ascending thoracic aortic aneurysm 4.2 cm in diameter in the midthoracic aorta, image 28/2. Mediastinum/Nodes: Ill-defined periaortic nodularity along the descending thoracic aorta. Conglomerate nodularity measures 1.5 by 1.8 cm posterior to the aorta on image 43/2, previously 3.7 by 3.1 cm on 07/06/2018, previously more solid and sharply defined in appearance. Lungs/Pleura: Severe paraseptal and centrilobular emphysema. Previously hypermetabolic region of nodularity in the left upper lobe currently measures about 1.6 by 0.9 cm on image 41/4, previously 1.5 by 1.4 cm. There is also abnormal accentuated metabolic activity peripherally along the pleural margin in the left upper lobe, that pleural process currently measures 1.0 cm in thickness, previously 1.1 cm. There is surrounding scarring and architectural distortion in the left upper lobe. Airway thickening is present, suggesting bronchitis or reactive airways disease. Musculoskeletal: Right degenerative glenohumeral arthropathy. Thoracic spondylosis. Lucency anteriorly in the T11 vertebral body measuring 1.2 by 1.0 cm on image 45/2 (formerly the same). This lesion was present but not significantly hypermetabolic on the prior PET-CT, although the adjacent adenopathy/mass in the paraspinal region did have some activity with maximum SUV of 5.0 previously. This lesion currently has a thin sclerotic rim, previously it did not. CT ABDOMEN PELVIS FINDINGS Hepatobiliary: A mildly complex 3.1 by 2.9 cm mostly hypodense lesion in the right hepatic lobe on image 54/2 appears unchanged from prior PET-CT and was not previously hypermetabolic.  Gallstones are present in the gallbladder. No other significant patent lesions are identified. Pancreas: Unremarkable Spleen: Unremarkable Adrenals/Urinary Tract: Both adrenal glands appear normal. Probable cyst of the right mid kidney, 1.8 by 1.5 cm on image 21/7, similar appearance on 09/26/2014. Urinary bladder unremarkable. Stomach/Bowel: Unremarkable Vascular/Lymphatic: Aortoiliac atherosclerotic vascular disease. Reproductive: Unremarkable Other: No supplemental non-categorized findings. Musculoskeletal: Bridging spurring of the sacroiliac joints anteriorly. Grade 1 degenerative anterolisthesis at L5-S1 with lumbar spondylosis and degenerative disc disease causing impingement at all levels between L2 and S1, but especially at L5-S1. IMPRESSION: 1. Notable scarring and volume loss in the left upper lobe. Minimal reduction in size of the previously hypermetabolic left upper lobe nodule and of the  adjacent mildly hypermetabolic left upper lobe pleural thickening. 2. Reduced size and definition of the periaortic adenopathy/mass. The adjacent T11 vertebral lucency, which may be from direct invasion, is unchanged in size but now has a sclerotic rim. 3. The right hepatic lobe mildly complex lesion is unchanged in size and was not previously hypermetabolic. 4. Ascending thoracic aortic aneurysm 4.2 cm in diameter. Recommend annual imaging followup by CTA or MRA. This recommendation follows 2010 ACCF/AHA/AATS/ACR/ASA/SCA/SCAI/SIR/STS/SVM Guidelines for the Diagnosis and Management of Patients with Thoracic Aortic Disease. Circulation. 2010; 121: O175-Z025. Aortic aneurysm NOS (ICD10-I71.9) 5. Other imaging findings of potential clinical significance: Aortic Atherosclerosis (ICD10-I70.0) and Emphysema (ICD10-J43.9). Coronary atherosclerosis. Airway thickening is present, suggesting bronchitis or reactive airways disease. Multilevel lumbar impingement. Cholelithiasis. Electronically Signed   By: Van Clines M.D.    On: 11/29/2018 11:02      ASSESSMENT & PLAN:  1. Hepatocellular carcinoma metastatic to left lung ([place])   2. Primary lung adenocarcinoma, left ([place])   3. Encounter for antineoplastic chemotherapy   4. Bone lesion   5. Transaminitis    #HCC, currently on lenvatinib since 09/07/2018.,  Labs are reviewed.  Clinically patient is doing well. Tumor marker AFP has trended down to 16 at the last visit.  Today's level is pending. Continue lenvatinib 12 mg daily.  #Lung adenocarcinoma, cT3 Nx Mx Status post SBRT to the lung lesion.  Continue to monitor.  Repeat CT scan in January 2021.  #Sclerotic T11 lesion. Continue Zometa monthly. Recommend patient to try other over-the-counter calcium tablets. Recommend patient to continue vitamin D supplementation .  He voices understanding and agrees with the plan  #Transaminitis, trending down.  Continue to monitor.  Follow-up in 4 weeks..   All questions were answered. The patient knows to call the clinic with any problems questions or concerns. Return of visit:  4 weeks.    Earlie Server, MD, PhD 02/12/2019

## 2019-02-12 NOTE — Progress Notes (Signed)
Nutrition Follow-up:  Patient with hepatocellular cancer metastatic to left lung.  Patient taking lenvatinib and zometa.  Patient followed by Dr. Tasia Catchings.    Met with patient during infusion this am.  Patient reports that his appetite continues to be good. Reports that he does not need any ensure at this time.  Has been eating solid foods and tolerating well.  Reports eating [place], vegetables, macaroni and cheese and sweets over the holiday.    Denies any nutrition impact symtpoms  Medications: reviewed  Labs: reviewed  Anthropometrics:   Weight increased to 148 lb today from 142 lb on 12/03  NUTRITION DIAGNOSIS:  Inadequate oral intake improved   INTERVENTION:  Encouraged patient to continue to eat well-balanced meal including good sources of protein.  Patient denies needing more ensure today.   Patient to call RD if appetite decreases, or weight decreases or needs anything.  Contact information given to patient.     NEXT VISIT: no follow-up planned Patient to call RD if needed  Annaleia Pence B. [name], [place], [place] Registered Dietitian [phone] (pager)

## 2019-02-12 NOTE — Progress Notes (Signed)
Patient here for follow uo. Patient stopped taking calcium because he broke out. He started itching and getting welts on neck and head.

## 2019-02-13 LAB — AFP TUMOR MARKER: AFP, Serum, Tumor Marker: 13.5 ng/mL — ABNORMAL HIGH (ref 0.0–8.3)

## 2019-02-21 ENCOUNTER — Other Ambulatory Visit: Payer: Self-pay | Admitting: Pharmacist

## 2019-02-21 DIAGNOSIS — C7802 Secondary malignant neoplasm of left lung: Secondary | ICD-10-CM

## 2019-02-21 DIAGNOSIS — C220 Liver cell carcinoma: Secondary | ICD-10-CM

## 2019-02-21 MED ORDER — LENVATINIB (12 MG DAILY DOSE) 3 X 4 MG PO CPPK: 12.0000 mg | ORAL_CAPSULE | Freq: Every day | ORAL | 1 refills | Status: DC

## 2019-02-21 NOTE — Progress Notes (Signed)
Oral Oncology Pharmacist Encounter   Patient is switching from manufacturer assistance to filling at [place]. Refill prescription for [facility] sent to [facility].   Oral Oncology Clinic will continue to follow patient.   Darl Pikes, PharmD, BCPS, [facility] Hematology/Oncology Clinical Pharmacist [facility] Oral [place] [facility] [phone]  02/21/2019 12:39 PM

## 2019-03-05 MED FILL — LENVIMA 12 MG DAILY DOSE 4: 3 X 4 | 30 days supply | Qty: 90 | Fill #0

## 2019-03-12 ENCOUNTER — Other Ambulatory Visit: Payer: Self-pay

## 2019-03-12 NOTE — Progress Notes (Signed)
Patient pre screened for office appointment, no questions or concerns today. Patient reminded of upcoming appointment time and date. 

## 2019-03-13 ENCOUNTER — Encounter: Payer: Self-pay | Admitting: Oncology

## 2019-03-13 ENCOUNTER — Emergency Department
Admission: EM | Admit: 2019-03-13 | Discharge: 2019-03-13 | Disposition: A | Discharge status: Home / Self Care | Payer: Medicare Other | Source: Home / Self Care | Attending: Student in an Organized Health Care Education/Training Program | Admitting: Student in an Organized Health Care Education/Training Program

## 2019-03-13 ENCOUNTER — Inpatient Hospital Stay: Payer: Medicare Other

## 2019-03-13 ENCOUNTER — Inpatient Hospital Stay: Payer: Medicare Other | Attending: Oncology

## 2019-03-13 ENCOUNTER — Inpatient Hospital Stay (HOSPITAL_BASED_OUTPATIENT_CLINIC_OR_DEPARTMENT_OTHER): Payer: Medicare Other | Admitting: Oncology

## 2019-03-13 ENCOUNTER — Other Ambulatory Visit: Payer: Self-pay

## 2019-03-13 ENCOUNTER — Encounter: Payer: Self-pay | Admitting: Emergency Medicine

## 2019-03-13 VITALS — BP 164/115 | Temp 96.4°F | Resp 16 | Wt 147.4 lb

## 2019-03-13 VITALS — BP 201/117

## 2019-03-13 DIAGNOSIS — R7401 Elevation of levels of liver transaminase levels: Secondary | ICD-10-CM | POA: Diagnosis not present

## 2019-03-13 DIAGNOSIS — I161 Hypertensive emergency: Secondary | ICD-10-CM | POA: Insufficient documentation

## 2019-03-13 DIAGNOSIS — I10 Essential (primary) hypertension: Secondary | ICD-10-CM

## 2019-03-13 DIAGNOSIS — Z79899 Other long term (current) drug therapy: Secondary | ICD-10-CM | POA: Insufficient documentation

## 2019-03-13 DIAGNOSIS — C3492 Malignant neoplasm of unspecified part of left bronchus or lung: Secondary | ICD-10-CM | POA: Diagnosis not present

## 2019-03-13 DIAGNOSIS — C7802 Secondary malignant neoplasm of left lung: Secondary | ICD-10-CM

## 2019-03-13 DIAGNOSIS — R5383 Other fatigue: Secondary | ICD-10-CM | POA: Insufficient documentation

## 2019-03-13 DIAGNOSIS — B1920 Unspecified viral hepatitis C without hepatic coma: Secondary | ICD-10-CM | POA: Insufficient documentation

## 2019-03-13 DIAGNOSIS — R0602 Shortness of breath: Secondary | ICD-10-CM | POA: Insufficient documentation

## 2019-03-13 DIAGNOSIS — J948 Other specified pleural conditions: Secondary | ICD-10-CM | POA: Insufficient documentation

## 2019-03-13 DIAGNOSIS — Z7289 Other problems related to lifestyle: Secondary | ICD-10-CM | POA: Insufficient documentation

## 2019-03-13 DIAGNOSIS — C220 Liver cell carcinoma: Secondary | ICD-10-CM | POA: Insufficient documentation

## 2019-03-13 DIAGNOSIS — Z8505 Personal history of malignant neoplasm of liver: Secondary | ICD-10-CM | POA: Insufficient documentation

## 2019-03-13 DIAGNOSIS — M899 Disorder of bone, unspecified: Secondary | ICD-10-CM

## 2019-03-13 DIAGNOSIS — Z809 Family history of malignant neoplasm, unspecified: Secondary | ICD-10-CM | POA: Diagnosis not present

## 2019-03-13 DIAGNOSIS — Z87891 Personal history of nicotine dependence: Secondary | ICD-10-CM | POA: Insufficient documentation

## 2019-03-13 DIAGNOSIS — I712 Thoracic aortic aneurysm, without rupture: Secondary | ICD-10-CM | POA: Diagnosis not present

## 2019-03-13 DIAGNOSIS — Z5111 Encounter for antineoplastic chemotherapy: Secondary | ICD-10-CM

## 2019-03-13 DIAGNOSIS — M25512 Pain in left shoulder: Secondary | ICD-10-CM | POA: Diagnosis not present

## 2019-03-13 DIAGNOSIS — C7951 Secondary malignant neoplasm of bone: Secondary | ICD-10-CM

## 2019-03-13 DIAGNOSIS — Z85830 Personal history of malignant neoplasm of bone: Secondary | ICD-10-CM | POA: Insufficient documentation

## 2019-03-13 LAB — CBC WITH DIFFERENTIAL/PLATELET
Abs Immature Granulocytes: 0.01 10*3/uL (ref 0.00–0.07)
Abs Immature Granulocytes: 0.01 10*3/uL (ref 0.00–0.07)
Basophils Absolute: 0 10*3/uL (ref 0.0–0.1)
Basophils Absolute: 0 10*3/uL (ref 0.0–0.1)
Basophils Relative: 1 %
Basophils Relative: 1 %
Eosinophils Absolute: 0.2 10*3/uL (ref 0.0–0.5)
Eosinophils Absolute: 0.3 10*3/uL (ref 0.0–0.5)
Eosinophils Relative: 5 %
Eosinophils Relative: 6 %
HCT: 45.6 % (ref 39.0–52.0)
HCT: 47.8 % (ref 39.0–52.0)
Hemoglobin: 15.1 g/dL (ref 13.0–17.0)
Hemoglobin: 16.5 g/dL (ref 13.0–17.0)
Immature Granulocytes: 0 %
Immature Granulocytes: 0 %
Lymphocytes Relative: 36 %
Lymphocytes Relative: 37 %
Lymphs Abs: 1.6 10*3/uL (ref 0.7–4.0)
Lymphs Abs: 1.6 10*3/uL (ref 0.7–4.0)
MCH: 31.3 pg (ref 26.0–34.0)
MCH: 31.7 pg (ref 26.0–34.0)
MCHC: 33.1 g/dL (ref 30.0–36.0)
MCHC: 34.5 g/dL (ref 30.0–36.0)
MCV: 94.4 fL (ref 80.0–100.0)
MCV: 91.9 fL (ref 80.0–100.0)
Monocytes Absolute: 0.6 10*3/uL (ref 0.1–1.0)
Monocytes Absolute: 0.6 10*3/uL (ref 0.1–1.0)
Monocytes Relative: 14 %
Monocytes Relative: 13 %
Neutro Abs: 2 10*3/uL (ref 1.7–7.7)
Neutro Abs: 2 10*3/uL (ref 1.7–7.7)
Neutrophils Relative %: 44 %
Neutrophils Relative %: 43 %
Platelets: 218 10*3/uL (ref 150–400)
Platelets: 224 10*3/uL (ref 150–400)
RBC: 4.83 MIL/uL (ref 4.22–5.81)
RBC: 5.2 MIL/uL (ref 4.22–5.81)
RDW: 15.9 % — ABNORMAL HIGH (ref 11.5–15.5)
RDW: 15.9 % — ABNORMAL HIGH (ref 11.5–15.5)
WBC: 4.5 10*3/uL (ref 4.0–10.5)
WBC: 4.4 10*3/uL (ref 4.0–10.5)
nRBC: 0 % (ref 0.0–0.2)
nRBC: 0 % (ref 0.0–0.2)

## 2019-03-13 LAB — COMPREHENSIVE METABOLIC PANEL
ALT: 37 U/L (ref 0–44)
AST: 45 U/L — ABNORMAL HIGH (ref 15–41)
Albumin: 3.7 g/dL (ref 3.5–5.0)
Alkaline Phosphatase: 65 U/L (ref 38–126)
Anion gap: 6 (ref 5–15)
BUN: 9 mg/dL (ref 8–23)
CO2: 28 mmol/L (ref 22–32)
Calcium: 9.4 mg/dL (ref 8.9–10.3)
Chloride: 107 mmol/L (ref 98–111)
Creatinine, Ser: 0.77 mg/dL (ref 0.61–1.24)
GFR calc Af Amer: >60 mL/min (ref >60)
GFR calc non Af Amer: >60 mL/min (ref >60)
Glucose, Bld: 105 mg/dL — ABNORMAL HIGH (ref 70–99)
Potassium: 4.3 mmol/L (ref 3.5–5.1)
Sodium: 141 mmol/L (ref 135–145)
Total Bilirubin: 0.7 mg/dL (ref 0.3–1.2)
Total Protein: 7.7 g/dL (ref 6.5–8.1)

## 2019-03-13 LAB — URINALYSIS, COMPLETE (UACMP) WITH MICROSCOPIC
Bacteria, UA: NONE SEEN
Bilirubin Urine: NEGATIVE
Glucose, UA: NEGATIVE mg/dL
Hgb urine dipstick: NEGATIVE
Ketones, ur: NEGATIVE mg/dL
Leukocytes,Ua: NEGATIVE
Nitrite: NEGATIVE
Protein, ur: NEGATIVE mg/dL
Specific Gravity, Urine: 1.005 (ref 1.005–1.030)
Squamous Epithelial / HPF: NONE SEEN (ref 0–5)
pH: 7 (ref 5.0–8.0)

## 2019-03-13 LAB — BASIC METABOLIC PANEL
Anion gap: 8 (ref 5–15)
BUN: 6 mg/dL — ABNORMAL LOW (ref 8–23)
CO2: 27 mmol/L (ref 22–32)
Calcium: 9.7 mg/dL (ref 8.9–10.3)
Chloride: 105 mmol/L (ref 98–111)
Creatinine, Ser: 0.76 mg/dL (ref 0.61–1.24)
GFR calc Af Amer: >60 mL/min (ref >60)
GFR calc non Af Amer: >60 mL/min (ref >60)
Glucose, Bld: 106 mg/dL — ABNORMAL HIGH (ref 70–99)
Potassium: 4.1 mmol/L (ref 3.5–5.1)
Sodium: 140 mmol/L (ref 135–145)

## 2019-03-13 MED ORDER — SODIUM CHLORIDE 0.9 % IV SOLN
Freq: Once | INTRAVENOUS | Status: AC
  Filled 2019-03-13: qty 250

## 2019-03-13 MED ORDER — AMLODIPINE BESYLATE 5 MG PO TABS: 5.0000 mg | ORAL_TABLET | Freq: Every day | ORAL | 0 refills | Status: DC

## 2019-03-13 MED ORDER — AMLODIPINE BESYLATE 5 MG PO TABS
5.0000 mg | ORAL_TABLET | Freq: Once | ORAL | Status: AC
  Administered 2019-03-13: 5 mg via ORAL
  Filled 2019-03-13: qty 1

## 2019-03-13 MED ORDER — ZOLEDRONIC ACID 4 MG/100ML IV SOLN
4.0000 mg | Freq: Once | INTRAVENOUS | Status: AC
  Administered 2019-03-13: 4 mg via INTRAVENOUS
  Filled 2019-03-13: qty 100

## 2019-03-13 NOTE — Progress Notes (Signed)
Patient does not offer any problems today.  Blood pressure is elevated in office today.

## 2019-03-13 NOTE — ED Provider Notes (Signed)
[facility] Emergency Department Provider Note    First MD Initiated Contact with Patient 03/13/19 1730     (approximate)  I have reviewed the triage vital signs and the nursing notes.   HISTORY  Chief Complaint Hypertension    HPI Samuel Hood. is a 69 y.o. male the below listed past medical history previous history of hypertension not currently taking his Norvasc presents the ER for elevated blood pressure.  He is otherwise asymptomatic denies any chest pain or shortness of breath.  No headache no blurry vision.  Was having infusion today and was sent over to the ER for evaluation of hypertension.  Norvasc was previously removed due to symptomatic orthostasis.    Past Medical History:  Diagnosis Date  . Hepatocellular carcinoma metastatic to left lung ([place]) 07/24/2018  . History of angiography    left lower extremity  . Hypertension   . Small bowel obstruction (HCC)    Family History  Problem Relation Age of Onset  . Cancer Mother   . Cancer Father    Past Surgical History:  Procedure Laterality Date  . COLON SURGERY    . COLONOSCOPY W/ POLYPECTOMY    . COLONOSCOPY WITH PROPOFOL N/A 01/31/2018   Procedure: COLONOSCOPY WITH PROPOFOL;  Surgeon: Toledo, Benay Pike, MD;  Location: [facility] ENDOSCOPY;  Service: Gastroenterology;  Laterality: N/A;  . debridement fasciotomy leg, left Left 03/19/2016  . LAPAROTOMY N/A 09/27/2014   Procedure: EXPLORATORY LAPAROTOMY;  Surgeon: Dia Crawford III, MD;  Location: [facility] ORS;  Service: General;  Laterality: N/A;   Patient Active Problem List   Diagnosis Date Noted  . Cancer, metastatic to bone ([place]) 12/16/2018  . Orthostatic hypotension 10/26/2018  . Encounter for antineoplastic chemotherapy 10/05/2018  . Primary lung adenocarcinoma, left ([place]) 09/02/2018  . Chronic active hepatitis ([place]) 07/28/2018  . Elevated PSA 07/28/2018  . Hepatocellular carcinoma metastatic to left lung ([place]) 07/24/2018  . Paraspinal mass  07/24/2018  . Goals of care, counseling/discussion 07/07/2018  . Liver mass 06/23/2018  . Lung nodule 06/23/2018  . Chronic bullous emphysema ([place]) 06/23/2018  . Hypertension 03/26/2016  . Small bowel obstruction ([place])   . Intestinal obstruction ([place]) 09/26/2014  . Essential hypertension 06/19/2014  . Hypokalemia 06/19/2014  . Bowel obstruction ([place]) 06/18/2014      Prior to Admission medications   Medication Sig Start Date End Date Taking? Authorizing Provider  Acetaminophen (TYLENOL PO) Take by mouth.    [provider]  Calcium Carbonate-Vit D-Min (CALCIUM 1200) 1200-1000 MG-UNIT CHEW Chew 1,200 mg by mouth daily. 01/15/19   Earlie Server, MD  Lenvatinib 12 mg daily dose (LENVIMA) 3 x 4 MG capsule Take 12 mg by mouth daily. 02/21/19   Earlie Server, MD  loperamide (IMODIUM) 2 MG capsule Take 1 capsule (2 mg total) by mouth See admin instructions. With onset of loose stool, take 4mg  followed by 2mg  every 2 hours until 12 hours have passed without loose bowel movement. Maximum: 16 mg/day 09/08/18   Earlie Server, MD  Multiple Vitamin (MULTIVITAMIN) capsule Take 1 capsule by mouth daily.    [provider]  polyethylene glycol (MIRALAX) 17 g packet Take 17 g by mouth daily. 10/12/18   Jacquelin Hawking, NP  senna (SENOKOT) 8.6 MG TABS tablet Take 1 tablet (8.6 mg total) by mouth daily. 10/12/18   Jacquelin Hawking, NP    Allergies Patient has no known allergies.    Social History Social History   Tobacco Use  .  Smoking status: Former Smoker    Packs/day: 0.25    Years: 50.00    Pack years: 12.50  . Smokeless tobacco: Never Used  . Tobacco comment: 1-2/week  Substance Use Topics  . Alcohol use: Yes    Alcohol/week: 35.0 standard drinks    Types: 30 Cans of beer, 5 Shots of liquor per week  . Drug use: No    Review of Systems Patient denies headaches, rhinorrhea, blurry vision, numbness, shortness of breath, chest pain, edema, cough, abdominal pain, nausea, vomiting,  diarrhea, dysuria, fevers, rashes or hallucinations unless otherwise stated above in HPI. ____________________________________________   PHYSICAL EXAM:  VITAL SIGNS: Vitals:   03/13/19 1735 03/13/19 1736  BP: (!) 197/110 (!) 163/116  Pulse: 66   Resp: 18 16  SpO2: 97% 98%    Constitutional: Alert and oriented.  Eyes: Conjunctivae are normal.  Head: Atraumatic. Nose: No congestion/rhinnorhea. Mouth/Throat: Mucous membranes are moist.   Neck: No stridor. Painless ROM.  Cardiovascular: Normal rate, regular rhythm. Grossly normal heart sounds.  Good peripheral circulation. Respiratory: Normal respiratory effort.  No retractions. Lungs CTAB. Gastrointestinal: Soft and nontender. No distention. No abdominal bruits. No CVA tenderness. Genitourinary:  Musculoskeletal: No lower extremity tenderness nor edema.  No joint effusions. Neurologic:  Normal speech and language. No gross focal neurologic deficits are appreciated. No facial droop Skin:  Skin is warm, dry and intact. No rash noted. Psychiatric: Mood and affect are normal. Speech and behavior are normal.  ____________________________________________   LABS (all labs ordered are listed, but only abnormal results are displayed)  Results for orders placed or performed in visit on 03/13/19 (from the past 24 hour(s))  Comprehensive metabolic panel     Status: Abnormal   Collection Time: 03/13/19 12:51 PM  Result Value Ref Range   Sodium 141 135 - 145 mmol/L   Potassium 4.3 3.5 - 5.1 mmol/L   Chloride 107 98 - 111 mmol/L   CO2 28 22 - 32 mmol/L   Glucose, Bld 105 (H) 70 - 99 mg/dL   BUN 9 8 - 23 mg/dL   Creatinine, Ser 0.77 0.61 - 1.24 mg/dL   Calcium 9.4 8.9 - 10.3 mg/dL   Total Protein 7.7 6.5 - 8.1 g/dL   Albumin 3.7 3.5 - 5.0 g/dL   AST 45 (H) 15 - 41 U/L   ALT 37 0 - 44 U/L   Alkaline Phosphatase 65 38 - 126 U/L   Total Bilirubin 0.7 0.3 - 1.2 mg/dL   GFR calc non Af Amer >60 >60 mL/min   GFR calc Af Amer >60 >60  mL/min   Anion gap 6 5 - 15  CBC with Differential     Status: Abnormal   Collection Time: 03/13/19 12:51 PM  Result Value Ref Range   WBC 4.5 4.0 - 10.5 K/uL   RBC 4.83 4.22 - 5.81 MIL/uL   Hemoglobin 15.1 13.0 - 17.0 g/dL   HCT 45.6 39.0 - 52.0 %   MCV 94.4 80.0 - 100.0 fL   MCH 31.3 26.0 - 34.0 pg   MCHC 33.1 30.0 - 36.0 g/dL   RDW 15.9 (H) 11.5 - 15.5 %   Platelets 218 150 - 400 K/uL   nRBC 0.0 0.0 - 0.2 %   Neutrophils Relative % 44 %   Neutro Abs 2.0 1.7 - 7.7 K/uL   Lymphocytes Relative 36 %   Lymphs Abs 1.6 0.7 - 4.0 K/uL   Monocytes Relative 14 %   Monocytes Absolute 0.6 0.1 -  1.0 K/uL   Eosinophils Relative 5 %   Eosinophils Absolute 0.2 0.0 - 0.5 K/uL   Basophils Relative 1 %   Basophils Absolute 0.0 0.0 - 0.1 K/uL   Immature Granulocytes 0 %   Abs Immature Granulocytes 0.01 0.00 - 0.07 K/uL   ____________________________________________  EKG My review and personal interpretation at Time: 15:36   Indication: htn  Rate: 75  Rhythm: sinus Axis: normal Other: normal intervals, nonspecific t wave abn, no stemi ____________________________________________  RADIOLOGY  I personally reviewed all radiographic images ordered to evaluate for the above acute complaints and reviewed radiology reports and findings.  These findings were personally discussed with the patient.  Please see medical record for radiology report.  ____________________________________________   PROCEDURES  Procedure(s) performed:  Procedures    Critical Care performed: no ____________________________________________   INITIAL IMPRESSION / ASSESSMENT AND PLAN / ED COURSE  Pertinent labs & imaging results that were available during my care of the patient were reviewed by me and considered in my medical decision making (see chart for details).   DDX: htn, aki, electrolyte abn, chf  Sammy Cassar. is a 69 y.o. who presents to the ED withconcern for elevated BP. Patient is AF,VSS with HTN  in ED. Exam as above. Given current presentation have considered the above differential.  rWas previously on norvasc and recently stopped. Extensive evaluation of possible end organ damage pursued in ED.  evidence of acute renal dysfunction. Neuro exam without focal deficits. EKG without evidence of ischemia. No chest pain or pressure. Renal function nml. Not consistent with CHF, malignant htn, adrenergic crisis or hypertensive emergency.   Will have patient restart norvasc and follow up with PCP for BP recheck. Discussed strict return parameters.      The patient was evaluated in Emergency Department today for the symptoms described in the history of present illness. He/she was evaluated in the context of the global COVID-19 pandemic, which necessitated consideration that the patient might be at risk for infection with the SARS-CoV-2 virus that causes COVID-19. Institutional protocols and algorithms that pertain to the evaluation of patients at risk for COVID-19 are in a state of rapid change based on information released by regulatory bodies including the CDC and federal and state organizations. These policies and algorithms were followed during the patient's care in the ED.  As part of my medical decision making, I reviewed the following data within the [place] notes reviewed and incorporated, Labs reviewed, notes from prior ED visits and [place] Controlled Substance Database   ____________________________________________   FINAL CLINICAL IMPRESSION(S) / ED DIAGNOSES  Final diagnoses:  Hypertension, unspecified type      NEW MEDICATIONS STARTED DURING THIS VISIT:  New Prescriptions   No medications on file     Note:  This document was prepared using Dragon voice recognition software and may include unintentional dictation errors.    Merlyn Lot, MD 03/13/19 1836

## 2019-03-13 NOTE — Progress Notes (Signed)
Per MD and treatment team to continue with zometa infusion with BP- 164/115.  At 1454 pt.'s BP rechecked- 180/177. Pt does not complain of any dizziness, he is not lightheaded, no pain, no discomfort at this time. Pt also states that "his BP has never been that high, that something is not right that he wants to talk to a doctor about his blood pressure right now." He also states "I do not want to leave here and die because of my blood pressure." RN notified MD immediately of pt.'s vitals and concerns. Per MD and treatment team pt needs to go to the ER to be evaluated immediately. BP rechecked at 1512- 201/117. Pt was informed that he needs to go to the ER to be evaluated immediately, pt verbalized and states that he will go straight to the ER when he leaves the clinic. Upon discharge, pt states that he does not have any complaints, no headache/dizziness, expresses that he does not feel lightheaded, and has no discomfort at this time. RN again encouraged pt to go straight to the ER to be evaluated. Pt verbalized understanding and states he is heading straight to the ER. MD and treatment team notified that pt verbalized that he was going straight to the ER.   Kiet Geer [employer]

## 2019-03-13 NOTE — ED Triage Notes (Signed)
Pt in via POV, sent over from Cancer center due to uncontrolled hypertension.  Pt denies being on BP meds; reports contraindication due to cancer medication.  BP 199/109, other vitals WDL.  Pt denies any dizziness, changes to vision or headache.

## 2019-03-14 LAB — AFP TUMOR MARKER: AFP, Serum, Tumor Marker: 12.6 ng/mL — ABNORMAL HIGH (ref 0.0–8.3)

## 2019-03-14 NOTE — Progress Notes (Signed)
Hematology/Oncology follow up note [facility] Telephone:([phone] Fax:([phone]) [phone]   Patient Care Team: Paulene Floor as PCP - General (Physician Assistant) Telford Nab, RN as Registered Nurse Clent Jacks, RN as Registered Nurse  REFERRING PROVIDER: Trinna Post, PA-C  CHIEF COMPLAINTS/REASON FOR VISIT:  Discussion of metastatic cancer management.  HISTORY OF PRESENTING ILLNESS:   Samuel Hood. is a  69 y.o.  male with PMH listed below was seen in consultation at the request of  Terrilee Croak, Adriana M, PA-C  for evaluation of lung nodule in the liver lesion. Patient has a past medical history of hypertension, alcohol abuse, smoking emphysema.  He has had serial CTs done in the past.  CT images were independently reviewed by me. 07/26/2017 CT chest with contrast showed extensive pleural-parenchymal scarring within the left upper lobe with several areas of soft tissue nodularity measuring up to 1.6 cm.  In this patient who is at increased risk of lung cancer further investigation with PET scan is recommended. Small chronic appearance loculated hydropneumothorax overlies the left apex. Slowly enlarging lesion within the central portion of the right lobe of liver identified. Per note, patient supposed to have additional work-up done in December repeat CT scan which he did not  Patient was recently seen by primary care provider and has subsequent image done for follow-up. CT on 06/30/2018 showed multiple significantly enlarged paraspinal mass are noted concerning.  The largest measures 3.8 x 0.5 cm and there appears to be a lytic destruction of the adjacent T11 vertebral body.  Also noted is new solid density measuring 17 x 12 mm in the pleural parenchymal density in the left upper lobe noted on prior exam.  Concerning for malignancy.  4.4 ascending thoracic aortic aneurysm.  Recommend annual imaging.  Patient had PET scan done on  07/06/2018 Which showed there are 2 FDG avid lesion within the left upper lobe worrisome for primary lung neoplasm.  There is evidence of chest wall involvement.  Metastasis to the posterior mediastinum is identified with extension into T11 vertebra and possible involvement of the left T12 neural foramina.  Patient reports left shoulder pain.  Smoking half a pack a day not motivated with further questioning quitting Denies weight loss, hemoptysis, cough.  Chronic shortness of breath with exertion. He drinks alcohol daily. Lives with his girlfriend.  He has 5 adult kids  #Hepatitis C  # 6/1?2020  CT-guided biopsy of left-sided posterior mediastinal soft tissue adjacent to T10/11. Patient's case was discussed on tumor board. Consensus was reached that [facility] versus primary lung cancer with hepatoid adenocarcinoma features. Consensus was to proceed with liver biopsy first as liver mass was not FDG avid on PET scan.  Patient underwent liver biopsy and the present to discuss pathology reports.  ## 08/08/2018 Liver mass biopsy showed fragments of necrotic and calcified tissue with adjacent fibrous capsule Scant viable nonneoplastic liver tissue with steatosis, inflammation and non specific fibrosis.  # 08/28/2018 CT guided left upper lobe lung mass biopsy showed poorly differentiated adenocarcinoma of lung origin.  6/19-08/16/2018  Finished palliative radiation to paraspinal soft tissue mass. 09/07/2018 patient was started on lenvatinib 12 mg daily. 10/13/2018 SBRT to lung cancer lesion.  #Patient is on Zometa monthly #He was advised to take calcium supplement.  He reports he quit taking calcium as he broke out rash on her scalp, neck and chest after taking calcium.   INTERVAL HISTORY Samuel Hood. is a 69 y.o. male who  has above history reviewed by me today presents for follow up visit for assessment of tolerability of chemotherapy. Patient has been on lenvatinib 12 mg daily.  Denies any fever,  chills, nausea, vomiting, diarrhea. His blood pressure was 164/115. He denies any headache.  He states that he has been monitoring his blood pressure at home and has always been normal.    Review of Systems  Constitutional: Positive for fatigue. Negative for appetite change, chills, fever and unexpected weight change.  HENT:   Negative for hearing loss and voice change.   Eyes: Negative for eye problems and icterus.  Respiratory: Negative for chest tightness, cough and shortness of breath.   Cardiovascular: Negative for chest pain and leg swelling.  Gastrointestinal: Negative for abdominal distention, abdominal pain and blood in stool.  Endocrine: Negative for hot flashes.  Genitourinary: Negative for difficulty urinating, dysuria and frequency.   Musculoskeletal: Negative for arthralgias.  Skin: Negative for itching and rash.  Neurological: Negative for dizziness, extremity weakness, light-headedness and numbness.  Hematological: Negative for adenopathy. Does not bruise/bleed easily.  Psychiatric/Behavioral: Negative for confusion.    MEDICAL HISTORY:  Past Medical History:  Diagnosis Date  . Hepatocellular carcinoma metastatic to left lung ([place]) 07/24/2018  . History of angiography    left lower extremity  . Hypertension   . Small bowel obstruction ([place])     SURGICAL HISTORY: Past Surgical History:  Procedure Laterality Date  . COLON SURGERY    . COLONOSCOPY W/ POLYPECTOMY    . COLONOSCOPY WITH PROPOFOL N/A 01/31/2018   Procedure: COLONOSCOPY WITH PROPOFOL;  Surgeon: Toledo, Benay Pike, MD;  Location: [facility] ENDOSCOPY;  Service: Gastroenterology;  Laterality: N/A;  . debridement fasciotomy leg, left Left 03/19/2016  . LAPAROTOMY N/A 09/27/2014   Procedure: EXPLORATORY LAPAROTOMY;  Surgeon: Dia Crawford III, MD;  Location: [facility] ORS;  Service: General;  Laterality: N/A;    SOCIAL HISTORY: Social History   Socioeconomic History  . Marital status: Single    Spouse name: Not on  file  . Number of children: Not on file  . Years of education: Not on file  . Highest education level: Not on file  Occupational History  . Occupation: retired  Tobacco Use  . Smoking status: Former Smoker    Packs/day: 0.25    Years: 50.00    Pack years: 12.50  . Smokeless tobacco: Never Used  . Tobacco comment: 1-2/week  Substance and Sexual Activity  . Alcohol use: Yes    Alcohol/week: 35.0 standard drinks    Types: 30 Cans of beer, 5 Shots of liquor per week  . Drug use: No  . Sexual activity: Not on file  Other Topics Concern  . Not on file  Social History Narrative  . Not on file   Social Determinants of Health   Financial Resource Strain: High Risk  . Difficulty of Paying Living Expenses: Hard  Food Insecurity: No Food Insecurity  . Worried About Charity fundraiser in the Last Year: Never true  . Ran Out of Food in the Last Year: Never true  Transportation Needs: No Transportation Needs  . Lack of Transportation (Medical): No  . Lack of Transportation (Non-Medical): No  Physical Activity: Sufficiently Active  . Days of Exercise per Week: 7 days  . Minutes of Exercise per Session: 30 min  Stress: No Stress Concern Present  . Feeling of Stress : Not at all  Social Connections: Unknown  . Frequency of Communication with Friends and Family: More than three  times a week  . Frequency of Social Gatherings with Friends and Family: More than three times a week  . Attends Religious Services: More than 4 times per year  . Active Member of Clubs or Organizations: Not on file  . Attends Archivist Meetings: Not on file  . Marital Status: Not on file  Intimate Partner Violence: Unknown  . Fear of Current or Ex-Partner: Patient refused  . Emotionally Abused: No  . Physically Abused: No  . Sexually Abused: No    FAMILY HISTORY: Family History  Problem Relation Age of Onset  . Cancer Mother   . Cancer Father     ALLERGIES:  has No Known  Allergies.  MEDICATIONS:  Current Outpatient Medications  Medication Sig Dispense Refill  . Acetaminophen (TYLENOL PO) Take by mouth.    . Calcium Carbonate-Vit D-Min (CALCIUM 1200) 1200-1000 MG-UNIT CHEW Chew 1,200 mg by mouth daily. 90 tablet 0  . Lenvatinib 12 mg daily dose (LENVIMA) 3 x 4 MG capsule Take 12 mg by mouth daily. 90 capsule 1  . loperamide (IMODIUM) 2 MG capsule Take 1 capsule (2 mg total) by mouth See admin instructions. With onset of loose stool, take 4mg  followed by 2mg  every 2 hours until 12 hours have passed without loose bowel movement. Maximum: 16 mg/day 60 capsule 1  . Multiple Vitamin (MULTIVITAMIN) capsule Take 1 capsule by mouth daily.    . polyethylene glycol (MIRALAX) 17 g packet Take 17 g by mouth daily. 21 each 0  . senna (SENOKOT) 8.6 MG TABS tablet Take 1 tablet (8.6 mg total) by mouth daily. 120 tablet 0  . amLODipine (NORVASC) 5 MG tablet Take 1 tablet (5 mg total) by mouth daily. 30 tablet 0   No current facility-administered medications for this visit.     PHYSICAL EXAMINATION: ECOG PERFORMANCE STATUS: 1 - Symptomatic but completely ambulatory Vitals:   03/13/19 1315 03/13/19 1317  BP: (!) 178/124 (!) 164/115  Resp:    Temp:     Filed Weights   03/13/19 1312  Weight: 147 lb 6.4 oz (66.9 kg)    Physical Exam Constitutional:      General: He is not in acute distress.    Comments: Thin built, walk independently  HENT:     Head: Normocephalic and atraumatic.  Eyes:     General: No scleral icterus.    Pupils: Pupils are equal, round, and reactive to light.  Cardiovascular:     Rate and Rhythm: Normal rate and regular rhythm.     Heart sounds: Normal heart sounds.  Pulmonary:     Effort: Pulmonary effort is normal. No respiratory distress.     Breath sounds: No wheezing.     Comments: Decreased breath sound bilaterally. Abdominal:     General: Bowel sounds are normal. There is no distension.     Palpations: Abdomen is soft. There is no  mass.     Tenderness: There is no abdominal tenderness.  Musculoskeletal:        General: No deformity. Normal range of motion.     Cervical back: Normal range of motion and neck supple.  Skin:    General: Skin is warm and dry.     Findings: No erythema or rash.  Neurological:     Mental Status: He is alert and oriented to person, place, and time.     Cranial Nerves: No cranial nerve deficit.     Coordination: Coordination normal.  Psychiatric:  Behavior: Behavior normal.        Thought Content: Thought content normal.     LABORATORY DATA:  I have reviewed the data as listed Lab Results  Component Value Date   WBC 4.4 03/13/2019   HGB 16.5 03/13/2019   HCT 47.8 03/13/2019   MCV 91.9 03/13/2019   PLT 224 03/13/2019   Recent Labs    01/15/19 0944 01/15/19 0944 02/12/19 1102 03/13/19 1251 03/13/19 1807  NA 141   < > 141 141 140  K 3.5   < > 4.0 4.3 4.1  CL 105   < > 108 107 105  CO2 28   < > 27 28 27   GLUCOSE 112*   < > 103* 105* 106*  BUN 9   < > 9 9 6*  CREATININE 0.76   < > 0.76 0.77 0.76  CALCIUM 9.2   < > 9.0 9.4 9.7  GFRNONAA >60   < > >60 >60 >60  GFRAA >60   < > >60 >60 >60  PROT 7.5  --  7.9 7.7  --   ALBUMIN 3.3*  --  3.6 3.7  --   AST 51*  --  45* 45*  --   ALT 39  --  31 37  --   ALKPHOS 72  --  78 65  --   BILITOT 0.6  --  0.5 0.7  --    < > = values in this interval not displayed.   Iron/TIBC/Ferritin/ %Sat No results found for: IRON, TIBC, FERRITIN, IRONPCTSAT   RADIOGRAPHIC STUDIES: I have personally reviewed the radiological images as listed and agreed with the findings in the report. No results found.    ASSESSMENT & PLAN:  1. Hepatocellular carcinoma metastatic to left lung ([place])   2. Primary lung adenocarcinoma, left ([place])   3. Encounter for antineoplastic chemotherapy   4. Bone lesion   5. Malignant hypertension    #HCC, currently on lenvatinib since 09/07/2018.,  Labs are reviewed and discussed with patient. Clinically he  is doing well To normal AFP has trended down, 12.6 today. Continue lenvatinib 12 mg daily.  #Lung adenocarcinoma, cT3 Nx Mx Status post SBRT to the lung lesion.  Continue surveillance. Obtain CT chest abdomen pelvis study for surveillance.  #Sclerotic T11 lesion.  Continue Zometa monthly. Patient declines taking calcium supplementation as it causes rash  #Mild transaminitis, AST 45, stable. #Hypertension emergency, patient's blood pressure further went up to 180/177, 201/170 patient was advised to go to emergency room for management of hypertension emergency.  I discussed with ER physician.  Patient was previously on Norvasc which was discontinued after he developed orthostatic hypotension due to dehydration.  Patient was restarted on Norvasc released from the emergency room after negative work-up in ED. Lenvatinib may also contribute to hypotension. Patient will return to follow-up in 2 weeks for repeat BP check.   All questions were answered. The patient knows to call the clinic with any problems questions or concerns.   Earlie Server, MD, PhD 03/14/2019

## 2019-03-20 ENCOUNTER — Other Ambulatory Visit: Payer: Self-pay

## 2019-03-20 ENCOUNTER — Encounter: Payer: Self-pay | Admitting: Radiation Oncology

## 2019-03-20 NOTE — Progress Notes (Signed)
Patient pre screened for office appointment, no questions or concerns today. Patient reminded of upcoming appointment time and date. 

## 2019-03-21 ENCOUNTER — Encounter: Payer: Self-pay | Admitting: Radiation Oncology

## 2019-03-21 ENCOUNTER — Other Ambulatory Visit: Payer: Self-pay

## 2019-03-21 ENCOUNTER — Ambulatory Visit
Admission: RE | Admit: 2019-03-21 | Discharge: 2019-03-21 | Disposition: A | Discharge status: Home / Self Care | Payer: Medicare Other | Source: Ambulatory Visit | Attending: Radiation Oncology | Admitting: Radiation Oncology

## 2019-03-21 VITALS — BP 142/98 | HR 116 | Temp 97.5°F | Resp 18 | Wt 148.0 lb

## 2019-03-21 DIAGNOSIS — C220 Liver cell carcinoma: Secondary | ICD-10-CM | POA: Diagnosis not present

## 2019-03-21 DIAGNOSIS — Z87891 Personal history of nicotine dependence: Secondary | ICD-10-CM | POA: Insufficient documentation

## 2019-03-21 DIAGNOSIS — Z923 Personal history of irradiation: Secondary | ICD-10-CM | POA: Insufficient documentation

## 2019-03-21 DIAGNOSIS — C7951 Secondary malignant neoplasm of bone: Secondary | ICD-10-CM | POA: Insufficient documentation

## 2019-03-21 DIAGNOSIS — C3412 Malignant neoplasm of upper lobe, left bronchus or lung: Secondary | ICD-10-CM | POA: Diagnosis not present

## 2019-03-21 DIAGNOSIS — C7802 Secondary malignant neoplasm of left lung: Secondary | ICD-10-CM

## 2019-03-21 NOTE — Progress Notes (Signed)
Radiation Oncology Follow up Note  Name: Samuel Hood.   Date:   03/21/2019 MRN:  [id number] DOB: [birth year]    This 69 y.o. male presents to the clinic today for 61-month follow-up status post definitive radiation therapy to left upper lobe for poorly differentiated T3 lesion of the left upper lobe.  REFERRING PROVIDER: Trinna Post, PA-C  HPI: Patient is a 69 year old male now at 5 months having completed palliative radiation therapy to a poorly differentiated adenocarcinoma that left upper lobe.  He had previously been treated to a paraspinal mass in patient with known history of hepatocellular carcinoma.  He is seen today in routine follow-up is doing fairly well he is currently on lenvatinib 12 mg daily.  He has a CT scan planned for next week.  CT scan of his chest back in October which I have reviewed shows volume loss in the left upper lobe with minimal reduction in size of hypermetabolic left upper lobe nodule.  He did have reduced size and definition of the periaortic adenopathy adjacent to the T11 vertebral body.  He specifically [place] cough hemoptysis or chest tightness has been having some problems with his hypertension.  COMPLICATIONS OF TREATMENT: none  FOLLOW UP COMPLIANCE: keeps appointments   PHYSICAL EXAM:  BP (!) 142/98   Pulse (!) 116   Temp (!) 97.5 F (36.4 C)   Resp 18   Wt 148 lb (67.1 kg)   BMI 24.63 kg/m  Well-developed well-nourished patient in NAD. HEENT reveals PERLA, EOMI, discs not visualized.  Oral cavity is clear. No oral mucosal lesions are identified. Neck is clear without evidence of cervical or supraclavicular adenopathy. Lungs are clear to A&P. Cardiac examination is essentially unremarkable with regular rate and rhythm without murmur rub or thrill. Abdomen is benign with no organomegaly or masses noted. Motor sensory and DTR levels are equal and symmetric in the upper and lower extremities. Cranial nerves II through XII are grossly intact.  Proprioception is intact. No peripheral adenopathy or edema is identified. No motor or sensory levels are noted. Crude visual fields are within normal range.  RADIOLOGY RESULTS: CT scan from October reviewed we will review his CT scans whether available from next week.  PLAN: Present time patient continues onlenvatinib daily which she is tolerating fairly well.  Interested in seeing what his new films next week will look like.  I will review them when they become available.  He continues close follow-up care and treatment with medical oncology.  Patient knows to call with any concerns.  I would like to take this opportunity to thank you for allowing me to participate in the care of your patient.Noreene Filbert, MD

## 2019-03-24 ENCOUNTER — Emergency Department: Payer: Medicare Other

## 2019-03-24 ENCOUNTER — Other Ambulatory Visit: Payer: Self-pay

## 2019-03-24 ENCOUNTER — Emergency Department
Admission: EM | Admit: 2019-03-24 | Discharge: 2019-03-24 | Disposition: A | Discharge status: Home / Self Care | Payer: Medicare Other | Attending: Emergency Medicine | Admitting: Emergency Medicine

## 2019-03-24 DIAGNOSIS — R079 Chest pain, unspecified: Secondary | ICD-10-CM

## 2019-03-24 DIAGNOSIS — R55 Syncope and collapse: Secondary | ICD-10-CM | POA: Insufficient documentation

## 2019-03-24 DIAGNOSIS — R0789 Other chest pain: Secondary | ICD-10-CM | POA: Diagnosis not present

## 2019-03-24 DIAGNOSIS — R609 Edema, unspecified: Secondary | ICD-10-CM | POA: Diagnosis not present

## 2019-03-24 DIAGNOSIS — I959 Hypotension, unspecified: Secondary | ICD-10-CM | POA: Diagnosis not present

## 2019-03-24 DIAGNOSIS — N289 Disorder of kidney and ureter, unspecified: Secondary | ICD-10-CM | POA: Diagnosis not present

## 2019-03-24 DIAGNOSIS — C220 Liver cell carcinoma: Secondary | ICD-10-CM | POA: Diagnosis not present

## 2019-03-24 DIAGNOSIS — Z79899 Other long term (current) drug therapy: Secondary | ICD-10-CM | POA: Diagnosis not present

## 2019-03-24 DIAGNOSIS — I10 Essential (primary) hypertension: Secondary | ICD-10-CM | POA: Diagnosis not present

## 2019-03-24 LAB — COMPREHENSIVE METABOLIC PANEL
ALT: 41 U/L (ref 0–44)
AST: 45 U/L — ABNORMAL HIGH (ref 15–41)
Albumin: 3.7 g/dL (ref 3.5–5.0)
Alkaline Phosphatase: 65 U/L (ref 38–126)
Anion gap: 10 (ref 5–15)
BUN: 9 mg/dL (ref 8–23)
CO2: 27 mmol/L (ref 22–32)
Calcium: 9.1 mg/dL (ref 8.9–10.3)
Chloride: 103 mmol/L (ref 98–111)
Creatinine, Ser: 0.84 mg/dL (ref 0.61–1.24)
GFR calc Af Amer: >60 mL/min (ref >60)
GFR calc non Af Amer: >60 mL/min (ref >60)
Glucose, Bld: 124 mg/dL — ABNORMAL HIGH (ref 70–99)
Potassium: 3.5 mmol/L (ref 3.5–5.1)
Sodium: 140 mmol/L (ref 135–145)
Total Bilirubin: 0.7 mg/dL (ref 0.3–1.2)
Total Protein: 8.2 g/dL — ABNORMAL HIGH (ref 6.5–8.1)

## 2019-03-24 LAB — CBC WITH DIFFERENTIAL/PLATELET
Abs Immature Granulocytes: 0.03 10*3/uL (ref 0.00–0.07)
Basophils Absolute: 0 10*3/uL (ref 0.0–0.1)
Basophils Relative: 0 %
Eosinophils Absolute: 0.2 10*3/uL (ref 0.0–0.5)
Eosinophils Relative: 3 %
HCT: 47 % (ref 39.0–52.0)
Hemoglobin: 16.3 g/dL (ref 13.0–17.0)
Immature Granulocytes: 1 %
Lymphocytes Relative: 20 %
Lymphs Abs: 1.1 10*3/uL (ref 0.7–4.0)
MCH: 31.8 pg (ref 26.0–34.0)
MCHC: 34.7 g/dL (ref 30.0–36.0)
MCV: 91.8 fL (ref 80.0–100.0)
Monocytes Absolute: 0.5 10*3/uL (ref 0.1–1.0)
Monocytes Relative: 10 %
Neutro Abs: 3.5 10*3/uL (ref 1.7–7.7)
Neutrophils Relative %: 66 %
Platelets: 266 10*3/uL (ref 150–400)
RBC: 5.12 MIL/uL (ref 4.22–5.81)
RDW: 14.9 % (ref 11.5–15.5)
WBC: 5.3 10*3/uL (ref 4.0–10.5)
nRBC: 0 % (ref 0.0–0.2)

## 2019-03-24 LAB — TROPONIN I (HIGH SENSITIVITY): Troponin I (High Sensitivity): 4 ng/L (ref <18)

## 2019-03-24 MED ORDER — SODIUM CHLORIDE 0.9% FLUSH
3.0000 mL | Freq: Once | INTRAVENOUS | Status: AC
  Administered 2019-03-24: 3 mL via INTRAVENOUS

## 2019-03-24 MED ORDER — SODIUM CHLORIDE 0.9 % IV BOLUS
1000.0000 mL | Freq: Once | INTRAVENOUS | Status: AC
  Administered 2019-03-24: 1000 mL via INTRAVENOUS

## 2019-03-24 MED ORDER — MORPHINE SULFATE (PF) 2 MG/ML IV SOLN
INTRAVENOUS | Status: AC
  Filled 2019-03-24: qty 1

## 2019-03-24 MED ORDER — OXYCODONE-ACETAMINOPHEN 5-325 MG PO TABS
2.0000 | ORAL_TABLET | Freq: Once | ORAL | Status: AC
  Administered 2019-03-24: 2 via ORAL
  Filled 2019-03-24: qty 2

## 2019-03-24 MED ORDER — OXYCODONE-ACETAMINOPHEN 5-325 MG PO TABS: 1.0000 | ORAL_TABLET | Freq: Two times a day (BID) | ORAL | 0 refills | Status: DC | PRN

## 2019-03-24 MED ORDER — MORPHINE SULFATE (PF) 2 MG/ML IV SOLN: 2.0000 mg | Freq: Once | INTRAVENOUS | Status: DC

## 2019-03-24 MED ORDER — SODIUM CHLORIDE 0.9 % IV BOLUS: 1000.0000 mL | Freq: Once | INTRAVENOUS | Status: DC

## 2019-03-24 MED ORDER — ONDANSETRON HCL 4 MG/2ML IJ SOLN
4.0000 mg | Freq: Once | INTRAMUSCULAR | Status: AC
  Administered 2019-03-24: 4 mg via INTRAVENOUS

## 2019-03-24 MED ORDER — NALOXONE HCL 2 MG/2ML IJ SOSY
0.4000 mg | PREFILLED_SYRINGE | Freq: Once | INTRAMUSCULAR | Status: AC
  Administered 2019-03-24: 0.4 mg via INTRAVENOUS

## 2019-03-24 MED ORDER — IOHEXOL 350 MG/ML SOLN
75.0000 mL | Freq: Once | INTRAVENOUS | Status: AC | PRN
  Administered 2019-03-24: 75 mL via INTRAVENOUS

## 2019-03-24 MED ORDER — MORPHINE SULFATE (PF) 2 MG/ML IV SOLN
2.0000 mg | Freq: Once | INTRAVENOUS | Status: AC
  Administered 2019-03-24: 2 mg via INTRAVENOUS

## 2019-03-24 NOTE — ED Provider Notes (Signed)
[facility] Emergency Department Provider Note  ____________________________________________   First MD Initiated Contact with Patient 03/24/19 [phone]     (approximate)  I have reviewed the triage vital signs and the nursing notes.   HISTORY  Chief Complaint Chest Pain    HPI Samuel Hood. is a 69 y.o. male with history of metastatic left lung cancer presents to the emergency department secondary to 10 out of 10 left-sided chest pain with swelling of the left chest wall x1 week.  Patient states left chest has been increased in tenderness and size over the past several days.  Patient states tonight he felt as though "I was going to pass out".        Past Medical History:  Diagnosis Date  . Hepatocellular carcinoma metastatic to left lung ([place]) 07/24/2018  . History of angiography    left lower extremity  . Hypertension   . Small bowel obstruction [facility])     Patient Active Problem List   Diagnosis Date Noted  . Cancer, metastatic to bone ([place]) 12/16/2018  . Orthostatic hypotension 10/26/2018  . Encounter for antineoplastic chemotherapy 10/05/2018  . Primary lung adenocarcinoma, left ([place]) 09/02/2018  . Chronic active hepatitis ([place]) 07/28/2018  . Elevated PSA 07/28/2018  . Hepatocellular carcinoma metastatic to left lung ([place]) 07/24/2018  . Paraspinal mass 07/24/2018  . Goals of care, counseling/discussion 07/07/2018  . Liver mass 06/23/2018  . Lung nodule 06/23/2018  . Chronic bullous emphysema ([place]) 06/23/2018  . Hypertension 03/26/2016  . Small bowel obstruction ([place])   . Intestinal obstruction ([place]) 09/26/2014  . Essential hypertension 06/19/2014  . Hypokalemia 06/19/2014  . Bowel obstruction ([name]) 06/18/2014    Past Surgical History:  Procedure Laterality Date  . COLON SURGERY    . COLONOSCOPY W/ POLYPECTOMY    . COLONOSCOPY WITH PROPOFOL N/A 01/31/2018   Procedure: COLONOSCOPY WITH PROPOFOL;  Surgeon: Tol[place], Benay Pike, MD;   Location: [facility] ENDOSCOPY;  Service: Gastroenterology;  Laterality: N/A;  . debridement fasciotomy leg, left Left 03/19/2016  . LAPAROTOMY N/A 09/27/2014   Procedure: EXPLORATORY LAPAROTOMY;  Surgeon: Dia Crawford III, MD;  Location: [facility] ORS;  Service: General;  Laterality: N/A;    Prior to Admission medications   Medication Sig Start Date End Date Taking? Authorizing Provider  Acetaminophen (TYLENOL PO) Take by mouth.    [provider]  amLODipine (NORVASC) 5 MG tablet Take 1 tablet (5 mg total) by mouth daily. 03/13/19 03/12/20  Merlyn Lot, MD  Calcium Carbonate-Vit D-Min (CALCIUM 1200) 1200-1000 MG-UNIT CHEW Chew 1,200 mg by mouth daily. 01/15/19   Earlie Server, MD  Lenvatinib 12 mg daily dose (LENVIMA) 3 x 4 MG capsule Take 12 mg by mouth daily. 02/21/19   Earlie Server, MD  loperamide (IMODIUM) 2 MG capsule Take 1 capsule (2 mg total) by mouth See admin instructions. With onset of loose stool, take 4mg  followed by 2mg  every 2 hours until 12 hours have passed without loose bowel movement. Maximum: 16 mg/day 09/08/18   Earlie Server, MD  Multiple Vitamin (MULTIVITAMIN) capsule Take 1 capsule by mouth daily.    [provider]  polyethylene glycol (MIRALAX) 17 g packet Take 17 g by mouth daily. 10/12/18   Jacquelin Hawking, NP  senna (SENOKOT) 8.6 MG TABS tablet Take 1 tablet (8.6 mg total) by mouth daily. 10/12/18   Jacquelin Hawking, NP    Allergies Patient has no known allergies.  Family History  Problem Relation Age of Onset  .  Cancer Mother   . Cancer Father     Social History Social History   Tobacco Use  . Smoking status: Former Smoker    Packs/day: 0.25    Years: 50.00    Pack years: 12.50  . Smokeless tobacco: Never Used  . Tobacco comment: 1-2/week  Substance Use Topics  . Alcohol use: Yes    Alcohol/week: 35.0 standard drinks    Types: 30 Cans of beer, 5 Shots of liquor per week  . Drug use: No    Review of Systems Constitutional: No fever/chills Eyes: No  visual changes. ENT: No sore throat. Cardiovascular: Positive for left chest pain. Respiratory: Denies shortness of breath. Gastrointestinal: No abdominal pain.  No nausea, no vomiting.  No diarrhea.  No constipation. Genitourinary: Negative for dysuria. Musculoskeletal: Negative for neck pain.  Negative for back pain. Integumentary: Negative for rash. Neurological: Negative for headaches, focal weakness or numbness.  ____________________________________________   PHYSICAL EXAM:  VITAL SIGNS: ED Triage Vitals  Enc Vitals Group     BP 03/24/19 0609 103/89     Pulse Rate 03/24/19 0609 97     Resp 03/24/19 0609 (!) 21     Temp 03/24/19 0609 97.6 F (36.4 C)     Temp src --      SpO2 03/24/19 0609 97 %     Weight 03/24/19 0606 67 kg (147 lb 11.3 oz)     Height --      Head Circumference --      Peak Flow --      Pain Score 03/24/19 0605 10     Pain Loc --      Pain Edu? --      Excl. in [place]? --     Constitutional: Alert and oriented.  Eyes: Conjunctivae are normal.  Mouth/Throat: Patient is wearing a mask. Neck: No stridor.  No meningeal signs.   Chest: Left chest wall visibly larger than the right, tender to touch.  No overlying skin changes Cardiovascular: Normal rate, regular rhythm. Good peripheral circulation. Grossly normal heart sounds. Respiratory: Normal respiratory effort.  No retractions. Gastrointestinal: Soft and nontender. No distention.  Musculoskeletal: No lower extremity tenderness nor edema. No gross deformities of extremities. Neurologic:  Normal speech and language. No gross focal neurologic deficits are appreciated.  Skin:  Skin is warm, dry and intact. Psychiatric: Mood and affect are normal. Speech and behavior are normal.  ____________________________________________   LABS (all labs ordered are listed, but only abnormal results are displayed)  Labs Reviewed  CBC WITH DIFFERENTIAL/PLATELET  COMPREHENSIVE METABOLIC PANEL  TROPONIN I (HIGH  SENSITIVITY)   ____________________________________________  EKG  ED ECG REPORT I, Muir N Linda Grimmer, the attending physician, personally viewed and interpreted this ECG.   Date: 03/24/2019  EKG Time: 6:06 AM  Rate: 87  Rhythm: Normal sinus rhythm  Axis: Normal  Intervals: Normal  ST&T Change: None   PROCEDURES   Procedure(s) performed (including Critical Care):  Procedures   ____________________________________________   INITIAL IMPRESSION / MDM / ASSESSMENT AND PLAN / ED COURSE  As part of my medical decision making, I reviewed the following data within the electronic MEDICAL RECORD NUMBER  69 year old male presenting with above-stated history and physical exam secondary to left chest pain presumed to be chest wall in origin.  Patient received 2 mg of IV morphine secondary to considerable discomfort.  Unfortunately the patient became acutely hypotensive after administration and as such Narcan 0.4 mg was administered.  Patient given 1 L IV normal  saline normotensive at present.  Chest x-ray revealed no acute abnormality CT chest pending at this time.  Patient's care transferred to Dr. Jimmye Norman      ____________________________________________  FINAL CLINICAL IMPRESSION(S) / ED DIAGNOSES  Final diagnoses:  Chest wall pain  Hepatocellular carcinoma ([place])     MEDICATIONS GIVEN DURING THIS VISIT:  Medications  sodium chloride flush (NS) 0.9 % injection 3 mL (3 mLs Intravenous Given 03/24/19 4315)     ED Discharge Orders    None      *Please note:  Verlin Uher. was evaluated in Emergency Department on 03/24/2019 for the symptoms described in the history of present illness. He was evaluated in the context of the global COVID-19 pandemic, which necessitated consideration that the patient might be at risk for infection with the SARS-CoV-2 virus that causes COVID-19. Institutional protocols and algorithms that pertain to the evaluation of patients at risk for COVID-19  are in a state of rapid change based on information released by regulatory bodies including the CDC and federal and state organizations. These policies and algorithms were followed during the patient's care in the ED.  Some ED evaluations and interventions may be delayed as a result of limited staffing during the pandemic.*  Note:  This document was prepared using Dragon voice recognition software and may include unintentional dictation errors.   Gregor Hams, MD 03/27/19 Shelah Lewandowsky

## 2019-03-24 NOTE — ED Provider Notes (Signed)
Patient is in no distress, I think he is having postradiation changes to his left chest wall.  Skin does not look red or inflamed but is enlarged compared to the right side and nonspecifically tender.  On CT there are new liver lesions but nothing new in the chest.  He will be given pain medicine and is encouraged to have close outpatient follow-up as scheduled in 2 days.   Earleen Newport, MD 03/24/19 [phone]

## 2019-03-24 NOTE — ED Notes (Signed)
Patient hypotensive and bradycardic post morphine injection. RN to bedside. RN rechecked BP in both arms - confirmed hypotension. Rate of fluid administration increased.  MD Owens Shark called and came to bedside. MD ordered Narcan IV; Narcan administered. RN remains at bedside. Will continue to monitor.

## 2019-03-24 NOTE — ED Triage Notes (Signed)
Patient coming [facility] from home for left chest pain. Patient has hx of metastatic lung cancer, left lung. Visibile swelling to left chest/tender to palpation. Patient reports pain ongoing for several days. Patient reports near syncopal episode today.

## 2019-03-26 ENCOUNTER — Other Ambulatory Visit: Payer: Self-pay

## 2019-03-26 ENCOUNTER — Ambulatory Visit
Admission: RE | Admit: 2019-03-26 | Discharge: 2019-03-26 | Disposition: A | Discharge status: Home / Self Care | Payer: Medicare Other | Source: Ambulatory Visit | Attending: Oncology | Admitting: Oncology

## 2019-03-26 DIAGNOSIS — C7802 Secondary malignant neoplasm of left lung: Secondary | ICD-10-CM | POA: Insufficient documentation

## 2019-03-26 DIAGNOSIS — C220 Liver cell carcinoma: Secondary | ICD-10-CM | POA: Diagnosis not present

## 2019-03-26 MED ORDER — IOHEXOL 300 MG/ML  SOLN
100.0000 mL | Freq: Once | INTRAMUSCULAR | Status: AC | PRN
  Administered 2019-03-26: 09:00:00 100 mL via INTRAVENOUS

## 2019-03-28 ENCOUNTER — Telehealth: Payer: Self-pay

## 2019-03-28 ENCOUNTER — Other Ambulatory Visit: Payer: Self-pay

## 2019-03-28 ENCOUNTER — Inpatient Hospital Stay (HOSPITAL_BASED_OUTPATIENT_CLINIC_OR_DEPARTMENT_OTHER): Payer: Medicare Other | Admitting: Oncology

## 2019-03-28 ENCOUNTER — Encounter: Payer: Self-pay | Admitting: Oncology

## 2019-03-28 ENCOUNTER — Inpatient Hospital Stay: Payer: Medicare Other | Attending: Oncology

## 2019-03-28 VITALS — BP 126/89 | HR 106 | Temp 97.7°F | Resp 16 | Wt 148.7 lb

## 2019-03-28 DIAGNOSIS — M47816 Spondylosis without myelopathy or radiculopathy, lumbar region: Secondary | ICD-10-CM | POA: Insufficient documentation

## 2019-03-28 DIAGNOSIS — J984 Other disorders of lung: Secondary | ICD-10-CM | POA: Insufficient documentation

## 2019-03-28 DIAGNOSIS — C3492 Malignant neoplasm of unspecified part of left bronchus or lung: Secondary | ICD-10-CM

## 2019-03-28 DIAGNOSIS — K449 Diaphragmatic hernia without obstruction or gangrene: Secondary | ICD-10-CM | POA: Insufficient documentation

## 2019-03-28 DIAGNOSIS — I700 Atherosclerosis of aorta: Secondary | ICD-10-CM | POA: Insufficient documentation

## 2019-03-28 DIAGNOSIS — Z87891 Personal history of nicotine dependence: Secondary | ICD-10-CM | POA: Insufficient documentation

## 2019-03-28 DIAGNOSIS — Z923 Personal history of irradiation: Secondary | ICD-10-CM | POA: Diagnosis not present

## 2019-03-28 DIAGNOSIS — I959 Hypotension, unspecified: Secondary | ICD-10-CM | POA: Insufficient documentation

## 2019-03-28 DIAGNOSIS — C220 Liver cell carcinoma: Secondary | ICD-10-CM

## 2019-03-28 DIAGNOSIS — M25512 Pain in left shoulder: Secondary | ICD-10-CM | POA: Diagnosis not present

## 2019-03-28 DIAGNOSIS — M5137 Other intervertebral disc degeneration, lumbosacral region: Secondary | ICD-10-CM | POA: Insufficient documentation

## 2019-03-28 DIAGNOSIS — C3412 Malignant neoplasm of upper lobe, left bronchus or lung: Secondary | ICD-10-CM | POA: Insufficient documentation

## 2019-03-28 DIAGNOSIS — K8020 Calculus of gallbladder without cholecystitis without obstruction: Secondary | ICD-10-CM | POA: Diagnosis not present

## 2019-03-28 DIAGNOSIS — Z79899 Other long term (current) drug therapy: Secondary | ICD-10-CM | POA: Diagnosis not present

## 2019-03-28 DIAGNOSIS — I712 Thoracic aortic aneurysm, without rupture: Secondary | ICD-10-CM | POA: Insufficient documentation

## 2019-03-28 DIAGNOSIS — J948 Other specified pleural conditions: Secondary | ICD-10-CM | POA: Diagnosis not present

## 2019-03-28 DIAGNOSIS — J439 Emphysema, unspecified: Secondary | ICD-10-CM | POA: Diagnosis not present

## 2019-03-28 DIAGNOSIS — B1920 Unspecified viral hepatitis C without hepatic coma: Secondary | ICD-10-CM | POA: Diagnosis not present

## 2019-03-28 DIAGNOSIS — C787 Secondary malignant neoplasm of liver and intrahepatic bile duct: Secondary | ICD-10-CM | POA: Insufficient documentation

## 2019-03-28 DIAGNOSIS — N62 Hypertrophy of breast: Secondary | ICD-10-CM | POA: Diagnosis not present

## 2019-03-28 DIAGNOSIS — C7951 Secondary malignant neoplasm of bone: Secondary | ICD-10-CM

## 2019-03-28 DIAGNOSIS — Z5111 Encounter for antineoplastic chemotherapy: Secondary | ICD-10-CM | POA: Diagnosis not present

## 2019-03-28 DIAGNOSIS — C7802 Secondary malignant neoplasm of left lung: Secondary | ICD-10-CM

## 2019-03-28 DIAGNOSIS — R5383 Other fatigue: Secondary | ICD-10-CM | POA: Diagnosis not present

## 2019-03-28 DIAGNOSIS — K769 Liver disease, unspecified: Secondary | ICD-10-CM | POA: Diagnosis not present

## 2019-03-28 LAB — COMPREHENSIVE METABOLIC PANEL
ALT: 41 U/L (ref 0–44)
AST: 43 U/L — ABNORMAL HIGH (ref 15–41)
Albumin: 3.8 g/dL (ref 3.5–5.0)
Alkaline Phosphatase: 65 U/L (ref 38–126)
Anion gap: 10 (ref 5–15)
BUN: 7 mg/dL — ABNORMAL LOW (ref 8–23)
CO2: 27 mmol/L (ref 22–32)
Calcium: 9.4 mg/dL (ref 8.9–10.3)
Chloride: 101 mmol/L (ref 98–111)
Creatinine, Ser: 0.69 mg/dL (ref 0.61–1.24)
GFR calc Af Amer: >60 mL/min (ref >60)
GFR calc non Af Amer: >60 mL/min (ref >60)
Glucose, Bld: 102 mg/dL — ABNORMAL HIGH (ref 70–99)
Potassium: 3.6 mmol/L (ref 3.5–5.1)
Sodium: 138 mmol/L (ref 135–145)
Total Bilirubin: 0.6 mg/dL (ref 0.3–1.2)
Total Protein: 8.5 g/dL — ABNORMAL HIGH (ref 6.5–8.1)

## 2019-03-28 LAB — CBC WITH DIFFERENTIAL/PLATELET
Abs Immature Granulocytes: 0.02 10*3/uL (ref 0.00–0.07)
Basophils Absolute: 0 10*3/uL (ref 0.0–0.1)
Basophils Relative: 1 %
Eosinophils Absolute: 0.2 10*3/uL (ref 0.0–0.5)
Eosinophils Relative: 3 %
HCT: 48.6 % (ref 39.0–52.0)
Hemoglobin: 16.1 g/dL (ref 13.0–17.0)
Immature Granulocytes: 0 %
Lymphocytes Relative: 28 %
Lymphs Abs: 1.4 10*3/uL (ref 0.7–4.0)
MCH: 30.5 pg (ref 26.0–34.0)
MCHC: 33.1 g/dL (ref 30.0–36.0)
MCV: 92 fL (ref 80.0–100.0)
Monocytes Absolute: 0.7 10*3/uL (ref 0.1–1.0)
Monocytes Relative: 12 %
Neutro Abs: 2.9 10*3/uL (ref 1.7–7.7)
Neutrophils Relative %: 56 %
Platelets: 328 10*3/uL (ref 150–400)
RBC: 5.28 MIL/uL (ref 4.22–5.81)
RDW: 14.8 % (ref 11.5–15.5)
WBC: 5.2 10*3/uL (ref 4.0–10.5)
nRBC: 0 % (ref 0.0–0.2)

## 2019-03-28 MED ORDER — OXYCODONE-ACETAMINOPHEN 5-325 MG PO TABS: 1.0000 | ORAL_TABLET | Freq: Two times a day (BID) | ORAL | 0 refills | Status: DC | PRN

## 2019-03-28 NOTE — Progress Notes (Signed)
Hematology/Oncology follow up note [facility] [phone] [phone]   Patient Care Team: Paulene Floor as PCP - General (Physician Assistant) Telford Nab, RN as Registered Nurse Clent Jacks, RN as Registered Nurse  REFERRING PROVIDER: Trinna Post, PA-C  CHIEF COMPLAINTS/REASON FOR VISIT:  Discussion of metastatic cancer management.  HISTORY OF PRESENTING ILLNESS:   Samuel Stjames. is a  69 y.o.  male with PMH listed below was seen in consultation at the request of  Terrilee Croak, Adriana M, PA-C  for evaluation of lung nodule in the liver lesion. Patient has a past medical history of hypertension, alcohol abuse, smoking emphysema.  He has had serial CTs done in the past.  CT images were independently reviewed by me. 07/26/2017 CT chest with contrast showed extensive pleural-parenchymal scarring within the left upper lobe with several areas of soft tissue nodularity measuring up to 1.6 cm.  In this patient who is at increased risk of lung cancer further investigation with PET scan is recommended. Small chronic appearance loculated hydropneumothorax overlies the left apex. Slowly enlarging lesion within the central portion of the right lobe of liver identified. Per note, patient supposed to have additional work-up done in December repeat CT scan which he did not  Patient was recently seen by primary care provider and has subsequent image done for follow-up. CT on 06/30/2018 showed multiple significantly enlarged paraspinal mass are noted concerning.  The largest measures 3.8 x 0.5 cm and there appears to be a lytic destruction of the adjacent T11 vertebral body.  Also noted is new solid density measuring 17 x 12 mm in the pleural parenchymal density in the left upper lobe noted on prior exam.  Concerning for malignancy.  4.4 ascending thoracic aortic aneurysm.  Recommend annual imaging.  Patient had PET scan done on  07/06/2018 Which showed there are 2 FDG avid lesion within the left upper lobe worrisome for primary lung neoplasm.  There is evidence of chest wall involvement.  Metastasis to the posterior mediastinum is identified with extension into T11 vertebra and possible involvement of the left T12 neural foramina.  Patient reports left shoulder pain.  Smoking half a pack a day not motivated with further questioning quitting Denies weight loss, hemoptysis, cough.  Chronic shortness of breath with exertion. He drinks alcohol daily. Lives with his girlfriend.  He has 5 adult kids  #Hepatitis C  # 6/1?2020  CT-guided biopsy of left-sided posterior mediastinal soft tissue adjacent to T10/11. Patient's case was discussed on tumor board. Consensus was reached that [facility] versus primary lung cancer with hepatoid adenocarcinoma features. Consensus was to proceed with liver biopsy first as liver mass was not FDG avid on PET scan.  Patient underwent liver biopsy and the present to discuss pathology reports.  ## 08/08/2018 Liver mass biopsy showed fragments of necrotic and calcified tissue with adjacent fibrous capsule Scant viable nonneoplastic liver tissue with steatosis, inflammation and non specific fibrosis.  # 08/28/2018 CT guided left upper lobe lung mass biopsy showed poorly differentiated adenocarcinoma of lung origin.  6/19-08/16/2018  Finished palliative radiation to paraspinal soft tissue mass. 09/07/2018 patient was started on lenvatinib 12 mg daily. 10/13/2018 SBRT to lung cancer lesion.  #Patient is on Zometa monthly #He was advised to take calcium supplement.  He reports he quit taking calcium as he broke out rash on her scalp, neck and chest after taking calcium.   INTERVAL HISTORY Samuel Reichel. is a 69 y.o. male who  has above history reviewed by me today presents for follow up visit for assessment of tolerability of chemotherapy. Patient has been on lenvatinib 12 mg daily.   I called patient's  daughter Samuel Hood and spoke to her over the phone. Since last visit, patient presented to emergency room on 03/23/2018 after experiencing 10 out of 10 left sided chest pain with swelling of the left chest wall for about a week.  Was given IV morphine in the ER and got hypotensive after administration and Narcan was given.  Patient was also given 1 L of IV fluid.  Chest x-ray showed no acute abnormality.  Troponin was negative. He had EKG done in the ER and was felt to be nonremarkable.  Chest wall pain was considered to be secondary to his radiation.  Patient had a CT chest PE protocol which showed no pulmonary embolism.  No etiology to explain his chest pain.  Abdomen pelvis with contrast was done to.  Which showed new liver lesions. Today patient continues to feel left chest wall swelling, he takes Percocet 5/325 mg 1-2 times daily which helps his symptoms. Patient had scheduled surveillance CT scanning on 03/26/2019.  Patient did not notify us that he had CT scan done in the emergency room.  So he had another set of CT done on 03/26/2019.   Review of Systems  Constitutional: Positive for fatigue. Negative for appetite change, chills, fever and unexpected weight change.  HENT:   Negative for hearing loss and voice change.   Eyes: Negative for eye problems and icterus.  Respiratory: Negative for chest tightness, cough and shortness of breath.   Cardiovascular: Negative for chest pain and leg swelling.  Gastrointestinal: Negative for abdominal distention, abdominal pain and blood in stool.  Endocrine: Negative for hot flashes.  Genitourinary: Negative for difficulty urinating, dysuria and frequency.   Musculoskeletal: Negative for arthralgias.  Skin: Negative for itching and rash.  Neurological: Negative for dizziness, extremity weakness, light-headedness and numbness.  Hematological: Negative for adenopathy. Does not bruise/bleed easily.  Psychiatric/Behavioral: Negative for confusion.    MEDICAL  HISTORY:  Past Medical History:  Diagnosis Date  . Hepatocellular carcinoma metastatic to left lung ([place]) 07/24/2018  . History of angiography    left lower extremity  . Hypertension   . Small bowel obstruction ([place])     SURGICAL HISTORY: Past Surgical History:  Procedure Laterality Date  . COLON SURGERY    . COLONOSCOPY W/ POLYPECTOMY    . COLONOSCOPY WITH PROPOFOL N/A 01/31/2018   Procedure: COLONOSCOPY WITH PROPOFOL;  Surgeon: Toledo, Benay Pike, MD;  Location: [facility] ENDOSCOPY;  Service: Gastroenterology;  Laterality: N/A;  . debridement fasciotomy leg, left Left 03/19/2016  . LAPAROTOMY N/A 09/27/2014   Procedure: EXPLORATORY LAPAROTOMY;  Surgeon: Dia Crawford III, MD;  Location: [facility] ORS;  Service: General;  Laterality: N/A;    SOCIAL HISTORY: Social History   Socioeconomic History  . Marital status: Single    Spouse name: Not on file  . Number of children: Not on file  . Years of education: Not on file  . Highest education level: Not on file  Occupational History  . Occupation: retired  Tobacco Use  . Smoking status: Former Smoker    Packs/day: 0.25    Years: 50.00    Pack years: 12.50  . Smokeless tobacco: Never Used  . Tobacco comment: 1-2/week  Substance and Sexual Activity  . Alcohol use: Yes    Alcohol/week: 35.0 standard drinks    Types: 30 Cans of beer,  5 Shots of liquor per week  . Drug use: No  . Sexual activity: Not on file  Other Topics Concern  . Not on file  Social History Narrative  . Not on file   Social Determinants of Health   Financial Resource Strain: High Risk  . Difficulty of Paying Living Expenses: Hard  Food Insecurity: No Food Insecurity  . Worried About Charity fundraiser in the Last Year: Never true  . Ran Out of Food in the Last Year: Never true  Transportation Needs: No Transportation Needs  . Lack of Transportation (Medical): No  . Lack of Transportation (Non-Medical): No  Physical Activity: Sufficiently Active  . Days of  Exercise per Week: 7 days  . Minutes of Exercise per Session: 30 min  Stress: No Stress Concern Present  . Feeling of Stress : Not at all  Social Connections: Unknown  . Frequency of Communication with Friends and Family: More than three times a week  . Frequency of Social Gatherings with Friends and Family: More than three times a week  . Attends Religious Services: More than 4 times per year  . Active Member of Clubs or Organizations: Not on file  . Attends Archivist Meetings: Not on file  . Marital Status: Not on file  Intimate Partner Violence: Unknown  . Fear of Current or Ex-Partner: Patient refused  . Emotionally Abused: No  . Physically Abused: No  . Sexually Abused: No    FAMILY HISTORY: Family History  Problem Relation Age of Onset  . Cancer Mother   . Cancer Father     ALLERGIES:  has No Known Allergies.  MEDICATIONS:  Current Outpatient Medications  Medication Sig Dispense Refill  . Acetaminophen (TYLENOL PO) Take by mouth.    Marland Kitchen amLODipine (NORVASC) 5 MG tablet Take 1 tablet (5 mg total) by mouth daily. 30 tablet 0  . Calcium Carbonate-Vit D-Min (CALCIUM 1200) 1200-1000 MG-UNIT CHEW Chew 1,200 mg by mouth daily. 90 tablet 0  . Lenvatinib 12 mg daily dose (LENVIMA) 3 x 4 MG capsule Take 12 mg by mouth daily. 90 capsule 1  . Multiple Vitamin (MULTIVITAMIN) capsule Take 1 capsule by mouth daily.    Marland Kitchen oxyCODONE-acetaminophen (PERCOCET) 5-325 MG tablet Take 1 tablet by mouth 2 (two) times daily as needed. 20 tablet 0  . polyethylene glycol (MIRALAX) 17 g packet Take 17 g by mouth daily. 21 each 0  . senna (SENOKOT) 8.6 MG TABS tablet Take 1 tablet (8.6 mg total) by mouth daily. 120 tablet 0  . loperamide (IMODIUM) 2 MG capsule Take 1 capsule (2 mg total) by mouth See admin instructions. With onset of loose stool, take 4mg  followed by 2mg  every 2 hours until 12 hours have passed without loose bowel movement. Maximum: 16 mg/day (Patient not taking: Reported on  03/28/2019) 60 capsule 1   No current facility-administered medications for this visit.     PHYSICAL EXAMINATION: ECOG PERFORMANCE STATUS: 1 - Symptomatic but completely ambulatory Vitals:   03/28/19 1031  BP: 126/89  Pulse: (!) 106  Resp: 16  Temp: 97.7 F (36.5 C)   Filed Weights   03/28/19 1031  Weight: 148 lb 11.2 oz (67.4 kg)    Physical Exam Constitutional:      General: He is not in acute distress.    Comments: Thin built, walk independently  HENT:     Head: Normocephalic and atraumatic.  Eyes:     General: No scleral icterus.    Pupils:  Pupils are equal, round, and reactive to light.  Cardiovascular:     Rate and Rhythm: Normal rate and regular rhythm.     Heart sounds: Normal heart sounds.  Pulmonary:     Effort: Pulmonary effort is normal. No respiratory distress.     Breath sounds: No wheezing.     Comments: Decreased breath sound bilaterally. Abdominal:     General: Bowel sounds are normal. There is no distension.     Palpations: Abdomen is soft. There is no mass.     Tenderness: There is no abdominal tenderness.  Musculoskeletal:        General: No deformity. Normal range of motion.     Cervical back: Normal range of motion and neck supple.     Comments: Palpable left anterior chest wall/breast pain Possible left gynecomastia  Skin:    General: Skin is warm and dry.     Findings: No erythema or rash.  Neurological:     Mental Status: He is alert and oriented to person, place, and time.     Cranial Nerves: No cranial nerve deficit.     Coordination: Coordination normal.  Psychiatric:        Behavior: Behavior normal.        Thought Content: Thought content normal.     LABORATORY DATA:  I have reviewed the data as listed Lab Results  Component Value Date   WBC 5.2 03/28/2019   HGB 16.1 03/28/2019   HCT 48.6 03/28/2019   MCV 92.0 03/28/2019   PLT 328 03/28/2019   Recent Labs    03/13/19 1251 03/13/19 1251 03/13/19 1807 03/24/19 0608  03/28/19 1014  NA 141   < > 140 140 138  K 4.3   < > 4.1 3.5 3.6  CL 107   < > 105 103 101  CO2 28   < > 27 27 27   GLUCOSE 105*   < > 106* 124* 102*  BUN 9   < > 6* 9 7*  CREATININE 0.77   < > 0.76 0.84 0.69  CALCIUM 9.4   < > 9.7 9.1 9.4  GFRNONAA >60   < > >60 >60 >60  GFRAA >60   < > >60 >60 >60  PROT 7.7  --   --  8.2* 8.5*  ALBUMIN 3.7  --   --  3.7 3.8  AST 45*  --   --  45* 43*  ALT 37  --   --  41 41  ALKPHOS 65  --   --  65 65  BILITOT 0.7  --   --  0.7 0.6   < > = values in this interval not displayed.   Iron/TIBC/Ferritin/ %Sat No results found for: IRON, TIBC, FERRITIN, IRONPCTSAT   RADIOGRAPHIC STUDIES: I have personally reviewed the radiological images as listed and agreed with the findings in the report. CT Chest W Contrast  Result Date: 03/26/2019 CLINICAL DATA:  Restaging left lung cancer. Hepatocellular carcinoma restaging. EXAM: CT CHEST, ABDOMEN, AND PELVIS WITH CONTRAST TECHNIQUE: Multidetector CT imaging of the chest, abdomen and pelvis was performed following the standard protocol during bolus administration of intravenous contrast. CONTRAST:  14mL OMNIPAQUE IOHEXOL 300 MG/ML  SOLN COMPARISON:  03/24/2019 and 11/29/2018 FINDINGS: CT CHEST FINDINGS Cardiovascular: Stable ascending thoracic aortic aneurysm, 4.2 cm in diameter. Coronary, aortic arch, and branch vessel atherosclerotic vascular disease. Prominence of the azygos vein. Mediastinum/Nodes: No pathologic adenopathy identified. Lungs/Pleura: Prominent emphysema. Bilateral airway thickening. Architectural distortion, scarring, and some chronic  nodularity in the left upper lobe, much of which is likely therapy related, and not appreciably changed. Musculoskeletal: Stable mild left paraspinal density at the T11 level with residual erosion along the left anterior T11 vertebral body. The soft tissue density adjacent to T11 is difficult to completely separate from the adjacent azygos vein but measures about 1.6 by  1.5 cm on image 48/2, and minimally are erodes the adjacent left lateral cortex of T11. This is roughly stable from 03/24/2019 exam. CT ABDOMEN PELVIS FINDINGS Hepatobiliary: Sharply defined segment 8 mass with mild internal heterogeneity measures 3.1 by 2.9 cm, previously 3.2 by 2.9 cm. On the prior exam was query whether this lesion occluding part of the hepatic vein, certainly the lesion is closely associated with the right hepatic vein although definite occlusion is not observed. Adjacent rim enhancing lesion in segment 81.5 by 1.5 cm, stable. A segment 6 lesion with central washout and peripheral enhancement measures about 2.2 by 2.4 cm on image 65/2, stable. Ill-defined hypodense lesion posteriorly in the right hepatic lobe approximately 0.7 cm in diameter on image 71/2. Overall the hepatic lesions are unchanged. 1.3 cm gallstone in the gallbladder. The gallbladder is mildly contracted. No biliary dilatation. Pancreas: Unremarkable Spleen: Unremarkable Adrenals/Urinary Tract: Both adrenal glands appear normal. Stable right renal hypodense lesions, likely cysts. Stomach/Bowel: Swirling of the small bowel mesentery in the central pelvis but without dilated bowel to suggest a significant volvulus. Stable postoperative findings along proximal sigmoid colon margin. Vascular/Lymphatic: Aortoiliac atherosclerotic vascular disease. No pathologic adenopathy identified. Reproductive: Speckled calcifications along the tunica albuginea the penis. Prostate gland unchanged. Other: No supplemental non-categorized findings. Musculoskeletal: Bridging spurring of both sacroiliac joints. Grade 1 anterolisthesis at L5-S1. Lumbar spondylosis and degenerative disc disease causing multilevel impingement most striking at L5-S1. IMPRESSION: 1. Essentially stable appearance of the hepatic metastatic lesions, not unexpected given that prior exam was from 2 days ago. The left paraspinal soft tissue density at T11 is likewise stable. 2.  Stable architectural distortion, scarring, and chronic nodularity in the left upper lobe, much of which is likely therapy related. 3. Swirling of the small bowel mesenteric vessels in the pelvis, but no dilated bowel to suggest a significant volvulus. 4. Other imaging findings of potential clinical significance: Airway thickening is present, suggesting bronchitis or reactive airways disease. Stable ascending thoracic aortic aneurysm, 4.2 cm in diameter. Cholelithiasis. Lumbar spondylosis and degenerative disc disease causing multilevel impingement most striking at L5-S1. Bridging spurring of both sacroiliac joints. Aortic Atherosclerosis (ICD10-I70.0) and Emphysema (ICD10-J43.9). Electronically Signed   By: Van Clines M.D.   On: 03/26/2019 10:58   CT Angio Chest PE W and/or Wo Contrast  Result Date: 03/24/2019 CLINICAL DATA:  69 year old male with chest pain. History of hepatocellular carcinoma/hepatocellular adenocarcinoma with a positive biopsy in the posterior mediastinum. Subsequent biopsy of liver mass was negative. EXAM: CT ANGIOGRAPHY CHEST CT ABDOMEN AND PELVIS WITH CONTRAST TECHNIQUE: Multidetector CT imaging of the chest was performed using the standard protocol during bolus administration of intravenous contrast. Multiplanar CT image reconstructions and MIPs were obtained to evaluate the vascular anatomy. Multidetector CT imaging of the abdomen and pelvis was performed using the standard protocol during bolus administration of intravenous contrast. CONTRAST:  52mL OMNIPAQUE IOHEXOL 350 MG/ML SOLN COMPARISON:  11/29/2018, CT 06/18/2014, 07/26/2017, 06/30/2018 FINDINGS: CTA CHEST FINDINGS Cardiovascular: Heart: Heart size unchanged. No pericardial fluid/thickening. Calcifications of the left main, left anterior descending, circumflex, right coronary arteries. Aorta: Greatest diameter of the ascending aorta estimated 42 mm which is unchanged  from the comparison. Opacification of the aorta it is  limited given the timing of the contrast bolus for the pulmonary arteries. Common origin of the innominate artery an the left common carotid artery. Mild atherosclerosis of the aortic arch. Pulmonary arteries: No central, lobar, segmental, or proximal subsegmental filling defects. Pulmonary arteries are attenuated within the left upper lobe secondary to treatment effect. Mediastinum/Nodes: No mediastinal adenopathy. Unremarkable appearance of the thoracic esophagus. Nodularity of the posterior mediastinum is significantly decreased as compared to the pre treatment CT of 06/30/2018. Unremarkable thoracic inlet. Lungs/Pleura: Advanced paraseptal and centrilobular emphysema, including bullous changes at the bilateral lung apices. Redemonstration of treatment changes of the left upper lung, with architectural distortion, scarring, and pleural thickening persisting. Subpleural reticulation at the right lower lobe lung base. No confluent airspace disease. No pleural effusion. Review of the MIP images confirms the above findings. CT ABDOMEN and PELVIS FINDINGS Hepatobiliary: Redemonstration of the heterogeneous mass within the right liver, segment 8, with well-defined border and a diameter of 2.7 cm, unchanged. Compared to prior CT there is a new satellite nodule extending inferior and lateral to this lesion on image 15 of series 4. Second hepatic lesion measuring 2.1 cm within segment 6 on image 22 of series 4, new. Third a [place] lesion segment 6 at the tip of the liver, image 27 of series 4 Additionally there is a heterogeneously hypoattenuating/enhancing region of the left liver with what appears to be occlusion of the left hepatic vein (image 14 of series 4. Hyper dense material in the dependent aspect of the gallbladder, without inflammatory changes. Pancreas: Unremarkable Spleen: Unremarkable Adrenals/Urinary Tract: - Right adrenal gland:  Unremarkable - Left adrenal gland: Unremarkable. - Right kidney: No  hydronephrosis, nephrolithiasis, inflammation, or ureteral dilation. Rounded lesions on the lateral cortex of the right kidney, most likely benign, unchanged - Left Kidney: No hydronephrosis, nephrolithiasis, inflammation, or ureteral dilation. No focal lesion. - Urinary Bladder: Unremarkable. Stomach/Bowel: - Stomach: Hiatal hernia with otherwise unremarkable stomach - Small bowel: Unremarkable - Appendix: Normal - Colon: Mild stool burden.  No focal inflammatory changes. Vascular/Lymphatic: Atherosclerotic calcifications of the abdominal aorta and bilateral iliac arteries. Proximal femoral arteries remain patent. No pelvic or abdominal adenopathy. Reproductive: Transverse diameter of the prostate measures 41 mm Other: Surgical changes along the midline abdomen with laxity in the midline. Small bowel loops are immediately subjacent to the midline laparotomy site Musculoskeletal: Degenerative changes of the thoracolumbar spine. Vacuum disc phenomenon spans all levels of the lumbar spine. Lytic lesion of T11 with sclerotic border. While this is unchanged in size from the most recent comparison CT, it is new from 2019 and in the region of the pathologically proven tumor of the posterior mediastinum. Review of the MIP images confirms the above findings. IMPRESSION: CT negative for pulmonary emboli, with no acute finding to account for chest pain. Progression of hepatic malignancy, with at least 3 new hepatic lesions, separate from the previously biopsied lesion of the right liver. In addition there is questionable new infiltrative disease of the left liver which appears to occlude the left hepatic vein. Referral for follow-up with oncology is indicated, as well as consideration of contrast-enhanced liver MRI. Treatment changes of the left upper lobe without evidence of local recurrence/progression. In addition, the T11 malignant lytic lesion and biopsy proven tumor of left posterior mediastinal/paravertebral T11 level  are unchanged. Advanced emphysema.  Emphysema (ICD10-J43.9). Aortic atherosclerosis and iliac arterial disease. Aortic Atherosclerosis (ICD10-I70.0). Unchanged diameter of ascending aorta, 4.2 cm. Recommend annual  imaging followup by CTA or MRA. This recommendation follows 2010 ACCF/AHA/AATS/ACR/ASA/SCA/SCAI/SIR/STS/SVM Guidelines for the Diagnosis and Management of Patients with Thoracic Aortic Disease. Circulation. 2010; 121: O709-G283. Aortic aneurysm NOS (ICD10-I71.9) Electronically Signed   By: Corrie Mckusick D.O.   On: 03/24/2019 08:53   CT Abdomen Pelvis W Contrast  Result Date: 03/26/2019 CLINICAL DATA:  Restaging left lung cancer. Hepatocellular carcinoma restaging. EXAM: CT CHEST, ABDOMEN, AND PELVIS WITH CONTRAST TECHNIQUE: Multidetector CT imaging of the chest, abdomen and pelvis was performed following the standard protocol during bolus administration of intravenous contrast. CONTRAST:  14mL OMNIPAQUE IOHEXOL 300 MG/ML  SOLN COMPARISON:  03/24/2019 and 11/29/2018 FINDINGS: CT CHEST FINDINGS Cardiovascular: Stable ascending thoracic aortic aneurysm, 4.2 cm in diameter. Coronary, aortic arch, and branch vessel atherosclerotic vascular disease. Prominence of the azygos vein. Mediastinum/Nodes: No pathologic adenopathy identified. Lungs/Pleura: Prominent emphysema. Bilateral airway thickening. Architectural distortion, scarring, and some chronic nodularity in the left upper lobe, much of which is likely therapy related, and not appreciably changed. Musculoskeletal: Stable mild left paraspinal density at the T11 level with residual erosion along the left anterior T11 vertebral body. The soft tissue density adjacent to T11 is difficult to completely separate from the adjacent azygos vein but measures about 1.6 by 1.5 cm on image 48/2, and minimally are erodes the adjacent left lateral cortex of T11. This is roughly stable from 03/24/2019 exam. CT ABDOMEN PELVIS FINDINGS Hepatobiliary: Sharply defined  segment 8 mass with mild internal heterogeneity measures 3.1 by 2.9 cm, previously 3.2 by 2.9 cm. On the prior exam was query whether this lesion occluding part of the hepatic vein, certainly the lesion is closely associated with the right hepatic vein although definite occlusion is not observed. Adjacent rim enhancing lesion in segment 81.5 by 1.5 cm, stable. A segment 6 lesion with central washout and peripheral enhancement measures about 2.2 by 2.4 cm on image 65/2, stable. Ill-defined hypodense lesion posteriorly in the right hepatic lobe approximately 0.7 cm in diameter on image 71/2. Overall the hepatic lesions are unchanged. 1.3 cm gallstone in the gallbladder. The gallbladder is mildly contracted. No biliary dilatation. Pancreas: Unremarkable Spleen: Unremarkable Adrenals/Urinary Tract: Both adrenal glands appear normal. Stable right renal hypodense lesions, likely cysts. Stomach/Bowel: Swirling of the small bowel mesentery in the central pelvis but without dilated bowel to suggest a significant volvulus. Stable postoperative findings along proximal sigmoid colon margin. Vascular/Lymphatic: Aortoiliac atherosclerotic vascular disease. No pathologic adenopathy identified. Reproductive: Speckled calcifications along the tunica albuginea the penis. Prostate gland unchanged. Other: No supplemental non-categorized findings. Musculoskeletal: Bridging spurring of both sacroiliac joints. Grade 1 anterolisthesis at L5-S1. Lumbar spondylosis and degenerative disc disease causing multilevel impingement most striking at L5-S1. IMPRESSION: 1. Essentially stable appearance of the hepatic metastatic lesions, not unexpected given that prior exam was from 2 days ago. The left paraspinal soft tissue density at T11 is likewise stable. 2. Stable architectural distortion, scarring, and chronic nodularity in the left upper lobe, much of which is likely therapy related. 3. Swirling of the small bowel mesenteric vessels in the  pelvis, but no dilated bowel to suggest a significant volvulus. 4. Other imaging findings of potential clinical significance: Airway thickening is present, suggesting bronchitis or reactive airways disease. Stable ascending thoracic aortic aneurysm, 4.2 cm in diameter. Cholelithiasis. Lumbar spondylosis and degenerative disc disease causing multilevel impingement most striking at L5-S1. Bridging spurring of both sacroiliac joints. Aortic Atherosclerosis (ICD10-I70.0) and Emphysema (ICD10-J43.9). Electronically Signed   By: Van Clines M.D.   On: 03/26/2019 10:58  CT ABDOMEN PELVIS W CONTRAST  Result Date: 03/24/2019 CLINICAL DATA:  69 year old male with chest pain. History of hepatocellular carcinoma/hepatocellular adenocarcinoma with a positive biopsy in the posterior mediastinum. Subsequent biopsy of liver mass was negative. EXAM: CT ANGIOGRAPHY CHEST CT ABDOMEN AND PELVIS WITH CONTRAST TECHNIQUE: Multidetector CT imaging of the chest was performed using the standard protocol during bolus administration of intravenous contrast. Multiplanar CT image reconstructions and MIPs were obtained to evaluate the vascular anatomy. Multidetector CT imaging of the abdomen and pelvis was performed using the standard protocol during bolus administration of intravenous contrast. CONTRAST:  45mL OMNIPAQUE IOHEXOL 350 MG/ML SOLN COMPARISON:  11/29/2018, CT 06/18/2014, 07/26/2017, 06/30/2018 FINDINGS: CTA CHEST FINDINGS Cardiovascular: Heart: Heart size unchanged. No pericardial fluid/thickening. Calcifications of the left main, left anterior descending, circumflex, right coronary arteries. Aorta: Greatest diameter of the ascending aorta estimated 42 mm which is unchanged from the comparison. Opacification of the aorta it is limited given the timing of the contrast bolus for the pulmonary arteries. Common origin of the innominate artery an the left common carotid artery. Mild atherosclerosis of the aortic arch. Pulmonary  arteries: No central, lobar, segmental, or proximal subsegmental filling defects. Pulmonary arteries are attenuated within the left upper lobe secondary to treatment effect. Mediastinum/Nodes: No mediastinal adenopathy. Unremarkable appearance of the thoracic esophagus. Nodularity of the posterior mediastinum is significantly decreased as compared to the pre treatment CT of 06/30/2018. Unremarkable thoracic inlet. Lungs/Pleura: Advanced paraseptal and centrilobular emphysema, including bullous changes at the bilateral lung apices. Redemonstration of treatment changes of the left upper lung, with architectural distortion, scarring, and pleural thickening persisting. Subpleural reticulation at the right lower lobe lung base. No confluent airspace disease. No pleural effusion. Review of the MIP images confirms the above findings. CT ABDOMEN and PELVIS FINDINGS Hepatobiliary: Redemonstration of the heterogeneous mass within the right liver, segment 8, with well-defined border and a diameter of 2.7 cm, unchanged. Compared to prior CT there is a new satellite nodule extending inferior and lateral to this lesion on image 15 of series 4. Second hepatic lesion measuring 2.1 cm within segment 6 on image 22 of series 4, new. Third a [place] lesion segment 6 at the tip of the liver, image 27 of series 4 Additionally there is a heterogeneously hypoattenuating/enhancing region of the left liver with what appears to be occlusion of the left hepatic vein (image 14 of series 4. Hyper dense material in the dependent aspect of the gallbladder, without inflammatory changes. Pancreas: Unremarkable Spleen: Unremarkable Adrenals/Urinary Tract: - Right adrenal gland:  Unremarkable - Left adrenal gland: Unremarkable. - Right kidney: No hydronephrosis, nephrolithiasis, inflammation, or ureteral dilation. Rounded lesions on the lateral cortex of the right kidney, most likely benign, unchanged - Left Kidney: No hydronephrosis, nephrolithiasis,  inflammation, or ureteral dilation. No focal lesion. - Urinary Bladder: Unremarkable. Stomach/Bowel: - Stomach: Hiatal hernia with otherwise unremarkable stomach - Small bowel: Unremarkable - Appendix: Normal - Colon: Mild stool burden.  No focal inflammatory changes. Vascular/Lymphatic: Atherosclerotic calcifications of the abdominal aorta and bilateral iliac arteries. Proximal femoral arteries remain patent. No pelvic or abdominal adenopathy. Reproductive: Transverse diameter of the prostate measures 41 mm Other: Surgical changes along the midline abdomen with laxity in the midline. Small bowel loops are immediately subjacent to the midline laparotomy site Musculoskeletal: Degenerative changes of the thoracolumbar spine. Vacuum disc phenomenon spans all levels of the lumbar spine. Lytic lesion of T11 with sclerotic border. While this is unchanged in size from the most recent comparison CT, it is  new from 2019 and in the region of the pathologically proven tumor of the posterior mediastinum. Review of the MIP images confirms the above findings. IMPRESSION: CT negative for pulmonary emboli, with no acute finding to account for chest pain. Progression of hepatic malignancy, with at least 3 new hepatic lesions, separate from the previously biopsied lesion of the right liver. In addition there is questionable new infiltrative disease of the left liver which appears to occlude the left hepatic vein. Referral for follow-up with oncology is indicated, as well as consideration of contrast-enhanced liver MRI. Treatment changes of the left upper lobe without evidence of local recurrence/progression. In addition, the T11 malignant lytic lesion and biopsy proven tumor of left posterior mediastinal/paravertebral T11 level are unchanged. Advanced emphysema.  Emphysema (ICD10-J43.9). Aortic atherosclerosis and iliac arterial disease. Aortic Atherosclerosis (ICD10-I70.0). Unchanged diameter of ascending aorta, 4.2 cm. Recommend  annual imaging followup by CTA or MRA. This recommendation follows 2010 ACCF/AHA/AATS/ACR/ASA/SCA/SCAI/SIR/STS/SVM Guidelines for the Diagnosis and Management of Patients with Thoracic Aortic Disease. Circulation. 2010; 121: G295-M841. Aortic aneurysm NOS (ICD10-I71.9) Electronically Signed   By: Corrie Mckusick D.O.   On: 03/24/2019 08:53   DG Chest Port 1 View  Result Date: 03/24/2019 CLINICAL DATA:  Acute chest pain. Patient with LEFT lung cancer. EXAM: PORTABLE CHEST 1 VIEW COMPARISON:  08/01/2017 FINDINGS: Cardiomediastinal silhouette is unchanged. Scarring within the UPPER LEFT lung again noted. Emphysema again identified. There is no evidence of focal airspace disease, pulmonary edema, suspicious pulmonary nodule/mass, pleural effusion, or pneumothorax. No acute bony abnormalities are identified. IMPRESSION: No evidence of acute cardiopulmonary disease. Electronically Signed   By: Margarette Canada M.D.   On: 03/24/2019 07:39      ASSESSMENT & PLAN:  1. Primary lung adenocarcinoma, left ([place])   2. Liver lesion   3. 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 Encounter for antineoplastic chemotherapy   5. Cancer, metastatic to bone [facility])    #Images were independently reviewed by me and discussed with patient. Patient has had developed 3 new hepatic lesions, separate from the previous biopsy lesion of right liver. There is questionable new infiltrative disease of left liver which appears to occlude the left hepatic vein. Patient previously had a liver mass biopsied which did not show cancer cells. He also has had biopsy of left-sided posterior mediastinal soft tissue adjacent to T10/T11 Pathology showed moderately differentiated hepatocellular carcinoma. Left upper lobe lung mass showed poorly differentiated adenocarcinoma of lung origin. Patient's case was previously discussed on tumor board.  Consensus reached the patient has 2 primaries.  [place] and lung cancer. Patient has  received radiation to the paraspinal soft tissue mass as well as lung mass. He has been on imatinib since then. AFP has decreased to 12.6, today's AFP level is pending. Now the images shows 3 new hepatic lesions. I discussed with the patient and the daughter that it is worth to find out the histology of the new liver lesions given that previous liver biopsy was not diagnostic. He may have metastatic disease to the liver versus progression of HCC. I have discussed with IR Dr. Pascal Lux who recommend ultrasound-guided biopsy of the dominant new liver lesion in the right lobe. Patient and daughter agree with the plan.  Further management pending on above work-up. I will see patient 1 week after the biopsy.  Left anterior chest wall pain ?left breast gynecomastia Given that his pain is reproducible with palpation.  Likely musculoskeletal versus pain secondary to breast gynecomastia.  Advised patient  to use Percocet 1-2 times daily for pain if needed.  #Sclerotic T11 lesion.  Continue Zometa every 4 to 6 weeks.  Patient declines taking calcium supplementation as it causes rash  #Blood pressure is better controlled.  Continue amlodipine.  All questions were answered. The patient knows to call the clinic with any problems questions or concerns.   Earlie Server, MD, PhD 03/28/2019

## 2019-03-28 NOTE — Telephone Encounter (Signed)
Request for US biopsy of the liver has been sent to centralized scheduling. Appt details pending. Pt will need to see Dr. Tasia Catchings, 1 week after biopsy for results.   Pt has lab/MD/zometa scheduled on 2/23. Appt can be adjusted to 1 week after biopsy, per Dr. Tasia Catchings.

## 2019-03-28 NOTE — Progress Notes (Signed)
Patient went to ER on 03/24/19 due to hypertension and left chest pain with arm tingling.  He does have left side breast/chest swelling since last week.  The left side chest pain is 6/10 today and was give rx for oxycodone at ER but did not take any today.

## 2019-03-29 ENCOUNTER — Other Ambulatory Visit: Payer: Self-pay

## 2019-03-29 DIAGNOSIS — K769 Liver disease, unspecified: Secondary | ICD-10-CM

## 2019-03-29 LAB — AFP TUMOR MARKER: AFP, Serum, Tumor Marker: 14.5 ng/mL — ABNORMAL HIGH (ref 0.0–8.3)

## 2019-03-29 NOTE — Telephone Encounter (Signed)
Appointments have been moved to 2/19.

## 2019-03-29 NOTE — Telephone Encounter (Signed)
Pt scheduled for biopsy on 2/12 and Allyson from IR has already talked to him abt it. Please move appt on 2/23 to 1 week after biopsy (lab/MD/zometa) and notify pt. Please remind him of biopsy as well too. Thank you.

## 2019-03-30 ENCOUNTER — Other Ambulatory Visit: Payer: Self-pay

## 2019-03-30 ENCOUNTER — Ambulatory Visit
Admission: RE | Admit: 2019-03-30 | Discharge: 2019-03-30 | Disposition: A | Discharge status: Home / Self Care | Payer: Medicare Other | Source: Ambulatory Visit | Attending: Oncology | Admitting: Oncology

## 2019-03-30 DIAGNOSIS — C3490 Malignant neoplasm of unspecified part of unspecified bronchus or lung: Secondary | ICD-10-CM | POA: Diagnosis not present

## 2019-03-30 DIAGNOSIS — K769 Liver disease, unspecified: Secondary | ICD-10-CM | POA: Insufficient documentation

## 2019-03-30 DIAGNOSIS — C229 Malignant neoplasm of liver, not specified as primary or secondary: Secondary | ICD-10-CM | POA: Diagnosis not present

## 2019-03-30 DIAGNOSIS — Z8505 Personal history of malignant neoplasm of liver: Secondary | ICD-10-CM | POA: Diagnosis not present

## 2019-03-30 DIAGNOSIS — Z85118 Personal history of other malignant neoplasm of bronchus and lung: Secondary | ICD-10-CM | POA: Insufficient documentation

## 2019-03-30 DIAGNOSIS — K7689 Other specified diseases of liver: Secondary | ICD-10-CM | POA: Diagnosis not present

## 2019-03-30 DIAGNOSIS — Z79899 Other long term (current) drug therapy: Secondary | ICD-10-CM | POA: Insufficient documentation

## 2019-03-30 DIAGNOSIS — Z923 Personal history of irradiation: Secondary | ICD-10-CM | POA: Diagnosis not present

## 2019-03-30 DIAGNOSIS — Z9221 Personal history of antineoplastic chemotherapy: Secondary | ICD-10-CM | POA: Diagnosis not present

## 2019-03-30 DIAGNOSIS — I10 Essential (primary) hypertension: Secondary | ICD-10-CM | POA: Insufficient documentation

## 2019-03-30 DIAGNOSIS — C787 Secondary malignant neoplasm of liver and intrahepatic bile duct: Secondary | ICD-10-CM | POA: Diagnosis not present

## 2019-03-30 DIAGNOSIS — Z7901 Long term (current) use of anticoagulants: Secondary | ICD-10-CM | POA: Insufficient documentation

## 2019-03-30 DIAGNOSIS — C220 Liver cell carcinoma: Secondary | ICD-10-CM | POA: Diagnosis not present

## 2019-03-30 LAB — PROTIME-INR
INR: 0.9 (ref 0.8–1.2)
Prothrombin Time: 12.5 seconds (ref 11.4–15.2)

## 2019-03-30 MED ORDER — FENTANYL CITRATE (PF) 100 MCG/2ML IJ SOLN
INTRAMUSCULAR | Status: AC | PRN
  Administered 2019-03-30 (×2): 50 ug via INTRAVENOUS

## 2019-03-30 MED ORDER — SODIUM CHLORIDE 0.9 % IV SOLN: INTRAVENOUS | Status: DC

## 2019-03-30 MED ORDER — ACETAMINOPHEN 500 MG PO TABS
ORAL_TABLET | ORAL | Status: AC
  Filled 2019-03-30: qty 2

## 2019-03-30 MED ORDER — MIDAZOLAM HCL 2 MG/2ML IJ SOLN
INTRAMUSCULAR | Status: AC
  Filled 2019-03-30: qty 2

## 2019-03-30 MED ORDER — MIDAZOLAM HCL 2 MG/2ML IJ SOLN
INTRAMUSCULAR | Status: AC | PRN
  Administered 2019-03-30 (×2): 1 mg via INTRAVENOUS

## 2019-03-30 MED ORDER — FENTANYL CITRATE (PF) 100 MCG/2ML IJ SOLN
INTRAMUSCULAR | Status: AC
  Filled 2019-03-30: qty 2

## 2019-03-30 MED ORDER — ACETAMINOPHEN 500 MG PO TABS
1000.0000 mg | ORAL_TABLET | Freq: Once | ORAL | Status: AC
  Administered 2019-03-30: 1000 mg via ORAL

## 2019-03-30 NOTE — Discharge Instructions (Signed)
Liver Biopsy, Care After These instructions give you information about how to care for yourself after your procedure. Your health care provider may also give you more specific instructions. If you have problems or questions, contact your health care provider. What can I expect after the procedure? After your procedure, it is common to have:  Pain and soreness in the area where the biopsy was done.  Bruising around the area where the biopsy was done.  Sleepiness and fatigue for 1-2 days. Follow these instructions at home: Medicines  Take over-the-counter and prescription medicines only as told by your health care provider.  If you were prescribed an antibiotic medicine, take it as told by your health care provider. Do not stop taking the antibiotic even if you start to feel better.  Do not take medicines such as aspirin and ibuprofen unless your health care provider tells you to take them. These medicines thin your blood and can increase the risk of bleeding.  If you are taking prescription pain medicine, take actions to prevent or treat constipation. Your health care provider may recommend that you: ? Drink enough fluid to keep your urine pale yellow. ? Eat foods that are high in fiber, such as fresh fruits and vegetables, whole grains, and beans. ? Limit foods that are high in fat and processed sugars, such as fried or sweet foods. ? Take an over-the-counter or prescription medicine for constipation. Incision care  Follow instructions from your health care provider about how to take care of your incision. Make sure you: ? Wash your hands with soap and water before you change your bandage (dressing). If soap and water are not available, use hand sanitizer. ? Change your dressing as told by your health care provider. ? Leave stitches (sutures), skin glue, or adhesive strips in place. These skin closures may need to stay in place for 2 weeks or longer. If adhesive strip edges start to  loosen and curl up, you may trim the loose edges. Do not remove adhesive strips completely unless your health care provider tells you to do that.  Check your incision area every day for signs of infection. Check for: ? Redness, swelling, or pain. ? Fluid or blood. ? Warmth. ? Pus or a bad smell.  Do not take baths, swim, or use a hot tub until your health care provider says it is okay to do so. Activity   Rest at home for 1-2 days, or as directed by your health care provider. ? Avoid sitting for a long time without moving. Get up to take short walks every 1-2 hours. This is important to improve blood flow and breathing. Ask for help if you feel weak or unsteady.  Return to your normal activities as told by your health care provider. Ask your health care provider what activities are safe for you.  Do not drive or use heavy machinery while taking prescription pain medicine.  Do not lift anything that is heavier than 10 lb (4.5 kg), or the limit that your health care provider tells you, until he or she says that it is safe.  Do not play contact sports for 2 weeks after the procedure. General instructions   Do not drink alcohol in the first week after the procedure.  Have someone stay with you for at least 24 hours after the procedure.  It is your responsibility to obtain your test results. Ask your health care provider, or the department that is doing the test: ? When will my   results be ready? ? How will I get my results? ? What are my treatment options? ? What other tests do I need? ? What are my next steps?  Keep all follow-up visits as told by your health care provider. This is important. Contact a health care provider if:  You have increased bleeding from an incision, resulting in more than a small spot of blood.  You have redness, swelling, or increasing pain in any incisions.  You notice a discharge or a bad smell coming from any of your incisions.  You have a fever or  chills. Get help right away if:  You develop swelling, bloating, or pain in your abdomen.  You become dizzy or faint.  You develop a rash.  You have nausea or you vomit.  You faint, or you have shortness of breath or difficulty breathing.  You develop chest pain.  You have problems with your speech or vision.  You have trouble with your balance or moving your arms or legs. Summary  After the liver biopsy, it is common to have pain, soreness, and bruising in the area, as well as sleepiness and fatigue.  Take over-the-counter and prescription medicines only as told by your health care provider.  Follow instructions from your health care provider about how to care for your incision. Check the incision area daily for signs of infection. This information is not intended to replace advice given to you by your health care provider. Make sure you discuss any questions you have with your health care provider. Document Revised: 03/27/2018 Document Reviewed: 02/11/2017 Elsevier Patient Education  2020 [employer]   Moderate Conscious Sedation, Adult, Care After These instructions provide you with information about caring for yourself after your procedure. Your health care provider may also give you more specific instructions. Your treatment has been planned according to current medical practices, but problems sometimes occur. Call your health care provider if you have any problems or questions after your procedure. What can I expect after the procedure? After your procedure, it is common:  To feel sleepy for several hours.  To feel clumsy and have poor balance for several hours.  To have poor judgment for several hours.  To vomit if you eat too soon. Follow these instructions at home: For at least 24 hours after the procedure:   Do not: ? Participate in activities where you could fall or become injured. ? Drive. ? Use heavy machinery. ? Drink alcohol. ? Take sleeping pills  or medicines that cause drowsiness. ? Make important decisions or sign legal documents. ? Take care of children on your own.  Rest. Eating and drinking  Follow the diet recommended by your health care provider.  If you vomit: ? Drink water, juice, or soup when you can drink without vomiting. ? Make sure you have little or no nausea before eating solid foods. General instructions  Have a responsible adult stay with you until you are awake and alert.  Take over-the-counter and prescription medicines only as told by your health care provider.  If you smoke, do not smoke without supervision.  Keep all follow-up visits as told by your health care provider. This is important. Contact a health care provider if:  You keep feeling nauseous or you keep vomiting.  You feel light-headed.  You develop a rash.  You have a fever. Get help right away if:  You have trouble breathing. This information is not intended to replace advice given to you by your health care   provider. Make sure you discuss any questions you have with your health care provider. Document Revised: 01/14/2017 Document Reviewed: 05/24/2015 Elsevier Patient Education  2020 [employer].  

## 2019-03-30 NOTE — Procedures (Signed)
New right inferior liver lesion c/w met  S/p US liver lesion core bx  No comp Stable ebl min Path pending Full report in pacs  

## 2019-03-30 NOTE — H&P (Signed)
Referring Physician(s): Yu,Zhou  Supervising Physician: Daryll Brod  Patient Status:  [facility] OP  Chief Complaint: "I'm having a liver biopsy"   Subjective: Pt familiar to IR service from left post mediastinal mass biopsy on 07/17/18 which yielded HCC, rt liver mass bx on 08/08/18 yielding nondiagnostic/necrotic material and LUL lung mass bx on 08/28/18 yielding poorly diff adenocarcinoma of lung origin. Recent imaging has revealed progression of hepatic disease and he presents today for image guided rt liver lesion bx for further evaluation. He denies fever, HA, dyspnea, cough, abd pain,N/V or bleeding.   Past Medical History:  Diagnosis Date  . Hepatocellular carcinoma metastatic to left lung ([place]) 07/24/2018  . History of angiography    left lower extremity  . Hypertension   . Small bowel obstruction [facility])    Past Surgical History:  Procedure Laterality Date  . COLON SURGERY    . COLONOSCOPY W/ POLYPECTOMY    . COLONOSCOPY WITH PROPOFOL N/A 01/31/2018   Procedure: COLONOSCOPY WITH PROPOFOL;  Surgeon: Toledo, Benay Pike, MD;  Location: [facility] ENDOSCOPY;  Service: Gastroenterology;  Laterality: N/A;  . debridement fasciotomy leg, left Left 03/19/2016  . LAPAROTOMY N/A 09/27/2014   Procedure: EXPLORATORY LAPAROTOMY;  Surgeon: Dia Crawford III, MD;  Location: [facility] ORS;  Service: General;  Laterality: N/A;      Allergies: Patient has no known allergies.  Medications: Prior to Admission medications   Medication Sig Start Date End Date Taking? Authorizing Provider  Acetaminophen (TYLENOL PO) Take by mouth.    [provider]  amLODipine (NORVASC) 5 MG tablet Take 1 tablet (5 mg total) by mouth daily. 03/13/19 03/12/20  Merlyn Lot, MD  Calcium Carbonate-Vit D-Min (CALCIUM 1200) 1200-1000 MG-UNIT CHEW Chew 1,200 mg by mouth daily. 01/15/19   Earlie Server, MD  Lenvatinib 12 mg daily dose (LENVIMA) 3 x 4 MG capsule Take 12 mg by mouth daily. 02/21/19   Earlie Server, MD  loperamide  (IMODIUM) 2 MG capsule Take 1 capsule (2 mg total) by mouth See admin instructions. With onset of loose stool, take 4mg  followed by 2mg  every 2 hours until 12 hours have passed without loose bowel movement. Maximum: 16 mg/day Patient not taking: Reported on 03/28/2019 09/08/18   Earlie Server, MD  Multiple Vitamin (MULTIVITAMIN) capsule Take 1 capsule by mouth daily.    [provider]  oxyCODONE-acetaminophen (PERCOCET) 5-325 MG tablet Take 1 tablet by mouth 2 (two) times daily as needed. 03/28/19   Earlie Server, MD  polyethylene glycol (MIRALAX) 17 g packet Take 17 g by mouth daily. 10/12/18   Jacquelin Hawking, NP  senna (SENOKOT) 8.6 MG TABS tablet Take 1 tablet (8.6 mg total) by mouth daily. 10/12/18   Jacquelin Hawking, NP     Vital Signs: Vitals:   03/30/19 0850  BP: (!) 119/93  Pulse: 91  Resp: 15  Temp: 97.9 F (36.6 C)  SpO2: 98%      Physical Exam awake/alert; chest clear to auscultation bilaterally.  Left anterior chest soft tissue swelling noted, slightly tender to palpation; heart with regular rate and rhythm.  Abdomen soft, positive bowel sounds, nontender.  No lower extremity edema.  Imaging: CT Chest W Contrast  Result Date: 03/26/2019 CLINICAL DATA:  Restaging left lung cancer. Hepatocellular carcinoma restaging. EXAM: CT CHEST, ABDOMEN, AND PELVIS WITH CONTRAST TECHNIQUE: Multidetector CT imaging of the chest, abdomen and pelvis was performed following the standard protocol during bolus administration of intravenous contrast. CONTRAST:  141mL OMNIPAQUE IOHEXOL 300 MG/ML  SOLN  COMPARISON:  03/24/2019 and 11/29/2018 FINDINGS: CT CHEST FINDINGS Cardiovascular: Stable ascending thoracic aortic aneurysm, 4.2 cm in diameter. Coronary, aortic arch, and branch vessel atherosclerotic vascular disease. Prominence of the azygos vein. Mediastinum/Nodes: No pathologic adenopathy identified. Lungs/Pleura: Prominent emphysema. Bilateral airway thickening. Architectural distortion, scarring,  and some chronic nodularity in the left upper lobe, much of which is likely therapy related, and not appreciably changed. Musculoskeletal: Stable mild left paraspinal density at the T11 level with residual erosion along the left anterior T11 vertebral body. The soft tissue density adjacent to T11 is difficult to completely separate from the adjacent azygos vein but measures about 1.6 by 1.5 cm on image 48/2, and minimally are erodes the adjacent left lateral cortex of T11. This is roughly stable from 03/24/2019 exam. CT ABDOMEN PELVIS FINDINGS Hepatobiliary: Sharply defined segment 8 mass with mild internal heterogeneity measures 3.1 by 2.9 cm, previously 3.2 by 2.9 cm. On the prior exam was query whether this lesion occluding part of the hepatic vein, certainly the lesion is closely associated with the right hepatic vein although definite occlusion is not observed. Adjacent rim enhancing lesion in segment 81.5 by 1.5 cm, stable. A segment 6 lesion with central washout and peripheral enhancement measures about 2.2 by 2.4 cm on image 65/2, stable. Ill-defined hypodense lesion posteriorly in the right hepatic lobe approximately 0.7 cm in diameter on image 71/2. Overall the hepatic lesions are unchanged. 1.3 cm gallstone in the gallbladder. The gallbladder is mildly contracted. No biliary dilatation. Pancreas: Unremarkable Spleen: Unremarkable Adrenals/Urinary Tract: Both adrenal glands appear normal. Stable right renal hypodense lesions, likely cysts. Stomach/Bowel: Swirling of the small bowel mesentery in the central pelvis but without dilated bowel to suggest a significant volvulus. Stable postoperative findings along proximal sigmoid colon margin. Vascular/Lymphatic: Aortoiliac atherosclerotic vascular disease. No pathologic adenopathy identified. Reproductive: Speckled calcifications along the tunica albuginea the penis. Prostate gland unchanged. Other: No supplemental non-categorized findings. Musculoskeletal:  Bridging spurring of both sacroiliac joints. Grade 1 anterolisthesis at L5-S1. Lumbar spondylosis and degenerative disc disease causing multilevel impingement most striking at L5-S1. IMPRESSION: 1. Essentially stable appearance of the hepatic metastatic lesions, not unexpected given that prior exam was from 2 days ago. The left paraspinal soft tissue density at T11 is likewise stable. 2. Stable architectural distortion, scarring, and chronic nodularity in the left upper lobe, much of which is likely therapy related. 3. Swirling of the small bowel mesenteric vessels in the pelvis, but no dilated bowel to suggest a significant volvulus. 4. Other imaging findings of potential clinical significance: Airway thickening is present, suggesting bronchitis or reactive airways disease. Stable ascending thoracic aortic aneurysm, 4.2 cm in diameter. Cholelithiasis. Lumbar spondylosis and degenerative disc disease causing multilevel impingement most striking at L5-S1. Bridging spurring of both sacroiliac joints. Aortic Atherosclerosis (ICD10-I70.0) and Emphysema (ICD10-J43.9). Electronically Signed   By: Van Clines M.D.   On: 03/26/2019 10:58   CT Abdomen Pelvis W Contrast  Result Date: 03/26/2019 CLINICAL DATA:  Restaging left lung cancer. Hepatocellular carcinoma restaging. EXAM: CT CHEST, ABDOMEN, AND PELVIS WITH CONTRAST TECHNIQUE: Multidetector CT imaging of the chest, abdomen and pelvis was performed following the standard protocol during bolus administration of intravenous contrast. CONTRAST:  122mL OMNIPAQUE IOHEXOL 300 MG/ML  SOLN COMPARISON:  03/24/2019 and 11/29/2018 FINDINGS: CT CHEST FINDINGS Cardiovascular: Stable ascending thoracic aortic aneurysm, 4.2 cm in diameter. Coronary, aortic arch, and branch vessel atherosclerotic vascular disease. Prominence of the azygos vein. Mediastinum/Nodes: No pathologic adenopathy identified. Lungs/Pleura: Prominent emphysema. Bilateral airway thickening. Architectural  distortion, scarring, and some chronic nodularity in the left upper lobe, much of which is likely therapy related, and not appreciably changed. Musculoskeletal: Stable mild left paraspinal density at the T11 level with residual erosion along the left anterior T11 vertebral body. The soft tissue density adjacent to T11 is difficult to completely separate from the adjacent azygos vein but measures about 1.6 by 1.5 cm on image 48/2, and minimally are erodes the adjacent left lateral cortex of T11. This is roughly stable from 03/24/2019 exam. CT ABDOMEN PELVIS FINDINGS Hepatobiliary: Sharply defined segment 8 mass with mild internal heterogeneity measures 3.1 by 2.9 cm, previously 3.2 by 2.9 cm. On the prior exam was query whether this lesion occluding part of the hepatic vein, certainly the lesion is closely associated with the right hepatic vein although definite occlusion is not observed. Adjacent rim enhancing lesion in segment 81.5 by 1.5 cm, stable. A segment 6 lesion with central washout and peripheral enhancement measures about 2.2 by 2.4 cm on image 65/2, stable. Ill-defined hypodense lesion posteriorly in the right hepatic lobe approximately 0.7 cm in diameter on image 71/2. Overall the hepatic lesions are unchanged. 1.3 cm gallstone in the gallbladder. The gallbladder is mildly contracted. No biliary dilatation. Pancreas: Unremarkable Spleen: Unremarkable Adrenals/Urinary Tract: Both adrenal glands appear normal. Stable right renal hypodense lesions, likely cysts. Stomach/Bowel: Swirling of the small bowel mesentery in the central pelvis but without dilated bowel to suggest a significant volvulus. Stable postoperative findings along proximal sigmoid colon margin. Vascular/Lymphatic: Aortoiliac atherosclerotic vascular disease. No pathologic adenopathy identified. Reproductive: Speckled calcifications along the tunica albuginea the penis. Prostate gland unchanged. Other: No supplemental non-categorized  findings. Musculoskeletal: Bridging spurring of both sacroiliac joints. Grade 1 anterolisthesis at L5-S1. Lumbar spondylosis and degenerative disc disease causing multilevel impingement most striking at L5-S1. IMPRESSION: 1. Essentially stable appearance of the hepatic metastatic lesions, not unexpected given that prior exam was from 2 days ago. The left paraspinal soft tissue density at T11 is likewise stable. 2. Stable architectural distortion, scarring, and chronic nodularity in the left upper lobe, much of which is likely therapy related. 3. Swirling of the small bowel mesenteric vessels in the pelvis, but no dilated bowel to suggest a significant volvulus. 4. Other imaging findings of potential clinical significance: Airway thickening is present, suggesting bronchitis or reactive airways disease. Stable ascending thoracic aortic aneurysm, 4.2 cm in diameter. Cholelithiasis. Lumbar spondylosis and degenerative disc disease causing multilevel impingement most striking at L5-S1. Bridging spurring of both sacroiliac joints. Aortic Atherosclerosis (ICD10-I70.0) and Emphysema (ICD10-J43.9). Electronically Signed   By: Van Clines M.D.   On: 03/26/2019 10:58    Labs:  CBC: Recent Labs    03/13/19 1251 03/13/19 1807 03/24/19 0608 03/28/19 1014  WBC 4.5 4.4 5.3 5.2  HGB 15.1 16.5 16.3 16.1  HCT 45.6 47.8 47.0 48.6  PLT 218 224 266 328    COAGS: Recent Labs    07/17/18 0725 08/08/18 1013 08/28/18 0747  INR 1.0 1.0 1.0  APTT 36  --   --     BMP: Recent Labs    03/13/19 1251 03/13/19 1807 03/24/19 0608 03/28/19 1014  NA 141 140 140 138  K 4.3 4.1 3.5 3.6  CL 107 105 103 101  CO2 28 27 27 27   GLUCOSE 105* 106* 124* 102*  BUN 9 6* 9 7*  CALCIUM 9.4 9.7 9.1 9.4  CREATININE 0.77 0.76 0.84 0.69  GFRNONAA >60 >60 >60 >60  GFRAA >60 >60 >60 >60  LIVER FUNCTION TESTS: Recent Labs    02/12/19 1102 03/13/19 1251 03/24/19 0608 03/28/19 1014  BILITOT 0.5 0.7 0.7 0.6  AST  45* 45* 45* 43*  ALT 31 37 41 41  ALKPHOS 78 65 65 65  PROT 7.9 7.7 8.2* 8.5*  ALBUMIN 3.6 3.7 3.7 3.8    Assessment and Plan: Patient with history of hepatitis C, hepatocellular carcinoma, left lung adenocarcinoma, status post chemoradiation.  Recent imaging reveals progression of liver disease.  Patient presents today for image guided liver lesion biopsy for further evaluation.Risks and benefits of procedure was discussed with the patient and/or patient's family including, but not limited to bleeding, infection, damage to adjacent structures or low yield requiring additional tests.  All of the questions were answered and there is agreement to proceed.  Consent signed and in chart.    Electronically Signed: D. Rowe Robert, PA-C 03/30/2019, 8:39 AM   I spent a total of 25 minutes at the the patient's bedside AND on the patient's hospital floor or unit, greater than 50% of which was counseling/coordinating care for image guided liver lesion biopsy

## 2019-04-03 LAB — SURGICAL PATHOLOGY

## 2019-04-03 MED FILL — LENVIMA 12 MG DAILY DOSE 4: 3 X 4 | 30 days supply | Qty: 90 | Fill #1

## 2019-04-04 ENCOUNTER — Telehealth: Payer: Self-pay

## 2019-04-04 NOTE — Telephone Encounter (Signed)
Request for omniseq sent to [place] [facility] on  Specimen ID: [id number] collected  on 03/30/19

## 2019-04-06 ENCOUNTER — Telehealth (INDEPENDENT_AMBULATORY_CARE_PROVIDER_SITE_OTHER): Payer: Self-pay

## 2019-04-06 ENCOUNTER — Inpatient Hospital Stay: Payer: Medicare Other

## 2019-04-06 ENCOUNTER — Other Ambulatory Visit: Payer: Self-pay

## 2019-04-06 ENCOUNTER — Encounter: Payer: Self-pay | Admitting: Oncology

## 2019-04-06 ENCOUNTER — Inpatient Hospital Stay (HOSPITAL_BASED_OUTPATIENT_CLINIC_OR_DEPARTMENT_OTHER): Payer: Medicare Other | Admitting: Oncology

## 2019-04-06 VITALS — BP 110/86 | HR 94 | Temp 96.6°F | Resp 18 | Wt 148.1 lb

## 2019-04-06 DIAGNOSIS — I712 Thoracic aortic aneurysm, without rupture: Secondary | ICD-10-CM | POA: Diagnosis not present

## 2019-04-06 DIAGNOSIS — C7951 Secondary malignant neoplasm of bone: Secondary | ICD-10-CM

## 2019-04-06 DIAGNOSIS — C3492 Malignant neoplasm of unspecified part of left bronchus or lung: Secondary | ICD-10-CM

## 2019-04-06 DIAGNOSIS — K449 Diaphragmatic hernia without obstruction or gangrene: Secondary | ICD-10-CM | POA: Diagnosis not present

## 2019-04-06 DIAGNOSIS — M47816 Spondylosis without myelopathy or radiculopathy, lumbar region: Secondary | ICD-10-CM | POA: Diagnosis not present

## 2019-04-06 DIAGNOSIS — C7802 Secondary malignant neoplasm of left lung: Secondary | ICD-10-CM | POA: Diagnosis not present

## 2019-04-06 DIAGNOSIS — C220 Liver cell carcinoma: Secondary | ICD-10-CM

## 2019-04-06 DIAGNOSIS — I700 Atherosclerosis of aorta: Secondary | ICD-10-CM | POA: Diagnosis not present

## 2019-04-06 DIAGNOSIS — Z5111 Encounter for antineoplastic chemotherapy: Secondary | ICD-10-CM

## 2019-04-06 DIAGNOSIS — Z7189 Other specified counseling: Secondary | ICD-10-CM | POA: Diagnosis not present

## 2019-04-06 DIAGNOSIS — J439 Emphysema, unspecified: Secondary | ICD-10-CM | POA: Diagnosis not present

## 2019-04-06 DIAGNOSIS — B1920 Unspecified viral hepatitis C without hepatic coma: Secondary | ICD-10-CM | POA: Diagnosis not present

## 2019-04-06 DIAGNOSIS — I959 Hypotension, unspecified: Secondary | ICD-10-CM | POA: Diagnosis not present

## 2019-04-06 DIAGNOSIS — M899 Disorder of bone, unspecified: Secondary | ICD-10-CM | POA: Insufficient documentation

## 2019-04-06 DIAGNOSIS — J948 Other specified pleural conditions: Secondary | ICD-10-CM | POA: Diagnosis not present

## 2019-04-06 DIAGNOSIS — M5137 Other intervertebral disc degeneration, lumbosacral region: Secondary | ICD-10-CM | POA: Diagnosis not present

## 2019-04-06 DIAGNOSIS — M25512 Pain in left shoulder: Secondary | ICD-10-CM | POA: Diagnosis not present

## 2019-04-06 DIAGNOSIS — R5383 Other fatigue: Secondary | ICD-10-CM | POA: Diagnosis not present

## 2019-04-06 DIAGNOSIS — N62 Hypertrophy of breast: Secondary | ICD-10-CM | POA: Diagnosis not present

## 2019-04-06 DIAGNOSIS — J984 Other disorders of lung: Secondary | ICD-10-CM | POA: Diagnosis not present

## 2019-04-06 DIAGNOSIS — C787 Secondary malignant neoplasm of liver and intrahepatic bile duct: Secondary | ICD-10-CM | POA: Diagnosis not present

## 2019-04-06 DIAGNOSIS — C3412 Malignant neoplasm of upper lobe, left bronchus or lung: Secondary | ICD-10-CM | POA: Diagnosis not present

## 2019-04-06 LAB — CBC WITH DIFFERENTIAL/PLATELET
Abs Immature Granulocytes: 0.02 K/uL (ref 0.00–0.07)
Basophils Absolute: 0 K/uL (ref 0.0–0.1)
Basophils Relative: 0 %
Eosinophils Absolute: 0.1 K/uL (ref 0.0–0.5)
Eosinophils Relative: 2 %
HCT: 49.5 % (ref 39.0–52.0)
Hemoglobin: 16.7 g/dL (ref 13.0–17.0)
Immature Granulocytes: 0 %
Lymphocytes Relative: 25 %
Lymphs Abs: 1.2 K/uL (ref 0.7–4.0)
MCH: 30.8 pg (ref 26.0–34.0)
MCHC: 33.7 g/dL (ref 30.0–36.0)
MCV: 91.3 fL (ref 80.0–100.0)
Monocytes Absolute: 0.7 K/uL (ref 0.1–1.0)
Monocytes Relative: 14 %
Neutro Abs: 2.9 K/uL (ref 1.7–7.7)
Neutrophils Relative %: 59 %
Platelets: 327 K/uL (ref 150–400)
RBC: 5.42 MIL/uL (ref 4.22–5.81)
RDW: 14.6 % (ref 11.5–15.5)
WBC: 4.8 K/uL (ref 4.0–10.5)
nRBC: 0 % (ref 0.0–0.2)

## 2019-04-06 LAB — COMPREHENSIVE METABOLIC PANEL
ALT: 40 U/L (ref 0–44)
AST: 36 U/L (ref 15–41)
Albumin: 3.5 g/dL (ref 3.5–5.0)
Alkaline Phosphatase: 78 U/L (ref 38–126)
Anion gap: 11 (ref 5–15)
BUN: 8 mg/dL (ref 8–23)
CO2: 27 mmol/L (ref 22–32)
Calcium: 9.3 mg/dL (ref 8.9–10.3)
Chloride: 100 mmol/L (ref 98–111)
Creatinine, Ser: 0.71 mg/dL (ref 0.61–1.24)
GFR calc Af Amer: >60 mL/min (ref >60)
GFR calc non Af Amer: >60 mL/min (ref >60)
Glucose, Bld: 87 mg/dL (ref 70–99)
Potassium: 3.8 mmol/L (ref 3.5–5.1)
Sodium: 138 mmol/L (ref 135–145)
Total Bilirubin: 0.6 mg/dL (ref 0.3–1.2)
Total Protein: 8.1 g/dL (ref 6.5–8.1)

## 2019-04-06 MED ORDER — OXYCODONE HCL 5 MG PO TABS: 5.0000 mg | ORAL_TABLET | Freq: Four times a day (QID) | ORAL | 0 refills | Status: DC | PRN

## 2019-04-06 MED ORDER — FENTANYL 12 MCG/HR TD PT72: 1.0000 | MEDICATED_PATCH | TRANSDERMAL | 0 refills | Status: DC

## 2019-04-06 MED ORDER — PROCHLORPERAZINE MALEATE 10 MG PO TABS: 10.0000 mg | ORAL_TABLET | Freq: Four times a day (QID) | ORAL | 1 refills | Status: DC | PRN

## 2019-04-06 MED ORDER — ZOLEDRONIC ACID 4 MG/100ML IV SOLN
4.0000 mg | Freq: Once | INTRAVENOUS | Status: AC
  Administered 2019-04-06: 4 mg via INTRAVENOUS
  Filled 2019-04-06: qty 100

## 2019-04-06 MED ORDER — SODIUM CHLORIDE 0.9 % IV SOLN
Freq: Once | INTRAVENOUS | Status: AC
  Filled 2019-04-06: qty 250

## 2019-04-06 MED ORDER — DEXAMETHASONE 4 MG PO TABS: 8.0000 mg | ORAL_TABLET | Freq: Every day | ORAL | 1 refills | Status: DC

## 2019-04-06 MED ORDER — ONDANSETRON HCL 8 MG PO TABS: 8.0000 mg | ORAL_TABLET | Freq: Two times a day (BID) | ORAL | 1 refills | Status: DC | PRN

## 2019-04-06 MED ORDER — LIDOCAINE-PRILOCAINE 2.5-2.5 % EX CREA: TOPICAL_CREAM | CUTANEOUS | 3 refills | Status: AC

## 2019-04-06 NOTE — Progress Notes (Signed)
OFF PATHWAY REGIMEN - Other  No Change  Continue With Treatment as Ordered.   [id number]:Lenvatinib:   Once daily:     Lenvatinib   **Always confirm dose/schedule in your pharmacy ordering system**  Patient Characteristics: Intent of Therapy: Non-Curative / Palliative Intent, Discussed with Patient

## 2019-04-06 NOTE — Telephone Encounter (Signed)
Spoke with the patient and gave him his pre-procedure instructions and that his procedure is on 2/223/21 with a 12:45 am arrival time to the [facility]. He will do covid testing on 04/09/19 before 11:00 am at the [place]. I let the patient know to be NPO, that he could have one person with him and to take his meds with small sips of water.

## 2019-04-06 NOTE — Progress Notes (Signed)
Patient here for follow up. Reports pain to left upper chest radiating to back. Not improving.

## 2019-04-06 NOTE — Progress Notes (Signed)
START OFF PATHWAY REGIMEN - Non-Small Cell Lung   [id number]:Atezolizumab 1,200 mg IV + Bevacizumab 15 mg/kg IV + Carboplatin AUC=6 IV + Paclitaxel 200 mg/m2 IV q21 Days x 4-6 Cycles:   A cycle is every 21 days:     Atezolizumab      Bevacizumab-xxxx      Paclitaxel      Carboplatin   **Always confirm dose/schedule in your pharmacy ordering system**  Patient Characteristics: Stage IV Metastatic, Nonsquamous, Initial Chemotherapy/Immunotherapy, PS = 0, 1, ALK or EGFR or ROS1 or NTRK or MET or RET Genomic Alterations - Awaiting Test Results Therapeutic Status: Stage IV Metastatic Histology: Nonsquamous Cell ROS1 Rearrangement Status: Awaiting Test Results Other Mutations/Biomarkers: No Other Actionable Mutations NTRK Gene Fusion Status: Awaiting Test Results PD-L1 Expression Status: Awaiting Test Results Chemotherapy/Immunotherapy LOT: Initial Chemotherapy/Immunotherapy Molecular Targeted Therapy: Not Appropriate MET Exon 14 Mutation Status: Awaiting Test Results RET Gene Fusion Status: Awaiting Test Results ALK Rearrangement Status: Awaiting Test Results EGFR Mutation Status: Awaiting Test Results BRAF V600E Mutation Status: Awaiting Test Results ECOG Performance Status: 1 Biomarker Assessment Status Confirmation: Awaiting genomic biomarker test(s) results and need to start chemotherapy Intent of Therapy: Non-Curative / Palliative Intent, Discussed with Patient

## 2019-04-06 NOTE — Progress Notes (Signed)
Hematology/Oncology follow up note [facility] [phone] [phone]   Patient Care Team: Paulene Floor as PCP - General (Physician Assistant) Telford Nab, RN as Registered Nurse Clent Jacks, RN as Registered Nurse  REFERRING PROVIDER: Trinna Post, PA-C  CHIEF COMPLAINTS/REASON FOR VISIT:  Discussion of metastatic cancer management.  HISTORY OF PRESENTING ILLNESS:   Samuel Hood. is a  69 y.o.  male with PMH listed below was seen in consultation at the request of  Terrilee Croak, Adriana M, PA-C  for evaluation of lung nodule in the liver lesion. Patient has a past medical history of hypertension, alcohol abuse, smoking emphysema.  He has had serial CTs done in the past.  CT images were independently reviewed by me. 07/26/2017 CT chest with contrast showed extensive pleural-parenchymal scarring within the left upper lobe with several areas of soft tissue nodularity measuring up to 1.6 cm.  In this patient who is at increased risk of lung cancer further investigation with PET scan is recommended. Small chronic appearance loculated hydropneumothorax overlies the left apex. Slowly enlarging lesion within the central portion of the right lobe of liver identified. Per note, patient supposed to have additional work-up done in December repeat CT scan which he did not  Patient was recently seen by primary care provider and has subsequent image done for follow-up. CT on 06/30/2018 showed multiple significantly enlarged paraspinal mass are noted concerning.  The largest measures 3.8 x 0.5 cm and there appears to be a lytic destruction of the adjacent T11 vertebral body.  Also noted is new solid density measuring 17 x 12 mm in the pleural parenchymal density in the left upper lobe noted on prior exam.  Concerning for malignancy.  4.4 ascending thoracic aortic aneurysm.  Recommend annual imaging.  Patient had PET scan done on  07/06/2018 Which showed there are 2 FDG avid lesion within the left upper lobe worrisome for primary lung neoplasm.  There is evidence of chest wall involvement.  Metastasis to the posterior mediastinum is identified with extension into T11 vertebra and possible involvement of the left T12 neural foramina.  Patient reports left shoulder pain.  Smoking half a pack a day not motivated with further questioning quitting Denies weight loss, hemoptysis, cough.  Chronic shortness of breath with exertion. He drinks alcohol daily. Lives with his girlfriend.  He has 5 adult kids  #Hepatitis C  # 6/1?2020  CT-guided biopsy of left-sided posterior mediastinal soft tissue adjacent to T10/11. Patient's case was discussed on tumor board. Consensus was reached that [facility] versus primary lung cancer with hepatoid adenocarcinoma features. Consensus was to proceed with liver biopsy first as liver mass was not FDG avid on PET scan.  Patient underwent liver biopsy and the present to discuss pathology reports.  ## 08/08/2018 Liver mass biopsy showed fragments of necrotic and calcified tissue with adjacent fibrous capsule Scant viable nonneoplastic liver tissue with steatosis, inflammation and non specific fibrosis.  # 08/28/2018 CT guided left upper lobe lung mass biopsy showed poorly differentiated adenocarcinoma of lung origin.  6/19-08/16/2018  Finished palliative radiation to paraspinal soft tissue mass. 09/07/2018 patient was started on lenvatinib 12 mg daily. 10/13/2018 SBRT to lung cancer lesion.  #Patient is on Zometa monthly #He was advised to take calcium supplement.  He reports he quit taking calcium as he broke out rash on her scalp, neck and chest after taking calcium.   INTERVAL HISTORY Samuel Hood. is a 69 y.o. male who  has above history reviewed by me today presents for follow up visit for assessment of tolerability of chemotherapy. Patient has been on lenvatinib 12 mg daily.   I called patient's  daughter Lester  and spoke to her over the phone. Patient is status post liver lesion biopsy.  He presents to discuss results. Patient continues to have left chest wall/left shoulder pain, he takes Percocet 2 times daily with some improvement.    Review of Systems  Constitutional: Positive for fatigue. Negative for appetite change, chills, fever and unexpected weight change.  HENT:   Negative for hearing loss and voice change.   Eyes: Negative for eye problems and icterus.  Respiratory: Negative for chest tightness, cough and shortness of breath.        Chest wall pain  Cardiovascular: Negative for chest pain and leg swelling.  Gastrointestinal: Negative for abdominal distention, abdominal pain and blood in stool.  Endocrine: Negative for hot flashes.  Genitourinary: Negative for difficulty urinating, dysuria and frequency.   Musculoskeletal: Negative for arthralgias.  Skin: Negative for itching and rash.  Neurological: Negative for dizziness, extremity weakness, light-headedness and numbness.  Hematological: Negative for adenopathy. Does not bruise/bleed easily.  Psychiatric/Behavioral: Negative for confusion.    MEDICAL HISTORY:  Past Medical History:  Diagnosis Date  . Hepatocellular carcinoma metastatic to left lung ([place]) 07/24/2018  . History of angiography    left lower extremity  . Hypertension   . Small bowel obstruction ([place])     SURGICAL HISTORY: Past Surgical History:  Procedure Laterality Date  . COLON SURGERY    . COLONOSCOPY W/ POLYPECTOMY    . COLONOSCOPY WITH PROPOFOL N/A 01/31/2018   Procedure: COLONOSCOPY WITH PROPOFOL;  Surgeon: Toledo, Benay Pike, MD;  Location: [facility] ENDOSCOPY;  Service: Gastroenterology;  Laterality: N/A;  . debridement fasciotomy leg, left Left 03/19/2016  . LAPAROTOMY N/A 09/27/2014   Procedure: EXPLORATORY LAPAROTOMY;  Surgeon: Dia Crawford III, MD;  Location: [facility] ORS;  Service: General;  Laterality: N/A;    SOCIAL HISTORY: Social  History   Socioeconomic History  . Marital status: Single    Spouse name: Not on file  . Number of children: Not on file  . Years of education: Not on file  . Highest education level: Not on file  Occupational History  . Occupation: retired  Tobacco Use  . Smoking status: Former Smoker    Packs/day: 0.25    Years: 50.00    Pack years: 12.50  . Smokeless tobacco: Never Used  . Tobacco comment: 1-2/week  Substance and Sexual Activity  . Alcohol use: Yes    Alcohol/week: 35.0 standard drinks    Types: 30 Cans of beer, 5 Shots of liquor per week  . Drug use: No  . Sexual activity: Not on file  Other Topics Concern  . Not on file  Social History Narrative  . Not on file   Social Determinants of Health   Financial Resource Strain: High Risk  . Difficulty of Paying Living Expenses: Hard  Food Insecurity: No Food Insecurity  . Worried About Charity fundraiser in the Last Year: Never true  . Ran Out of Food in the Last Year: Never true  Transportation Needs: No Transportation Needs  . Lack of Transportation (Medical): No  . Lack of Transportation (Non-Medical): No  Physical Activity: Sufficiently Active  . Days of Exercise per Week: 7 days  . Minutes of Exercise per Session: 30 min  Stress: No Stress Concern Present  . Feeling of Stress :  Not at all  Social Connections: Unknown  . Frequency of Communication with Friends and Family: More than three times a week  . Frequency of Social Gatherings with Friends and Family: More than three times a week  . Attends Religious Services: More than 4 times per year  . Active Member of Clubs or Organizations: Not on file  . Attends Archivist Meetings: Not on file  . Marital Status: Not on file  Intimate Partner Violence: Unknown  . Fear of Current or Ex-Partner: Patient refused  . Emotionally Abused: No  . Physically Abused: No  . Sexually Abused: No    FAMILY HISTORY: Family History  Problem Relation Age of Onset  .  Cancer Mother   . Cancer Father     ALLERGIES:  is allergic to 5-alpha reductase inhibitors.  MEDICATIONS:  Current Outpatient Medications  Medication Sig Dispense Refill  . Acetaminophen (TYLENOL PO) Take by mouth.    Marland Kitchen amLODipine (NORVASC) 5 MG tablet Take 1 tablet (5 mg total) by mouth daily. 30 tablet 0  . Calcium Carbonate-Vit D-Min (CALCIUM 1200) 1200-1000 MG-UNIT CHEW Chew 1,200 mg by mouth daily. 90 tablet 0  . Lenvatinib 12 mg daily dose (LENVIMA) 3 x 4 MG capsule Take 12 mg by mouth daily. 90 capsule 1  . polyethylene glycol (MIRALAX) 17 g packet Take 17 g by mouth daily. 21 each 0  . senna (SENOKOT) 8.6 MG TABS tablet Take 1 tablet (8.6 mg total) by mouth daily. 120 tablet 0  . fentaNYL (DURAGESIC) 12 MCG/HR Place 1 patch onto the skin every 3 (three) days. 10 patch 0  . loperamide (IMODIUM) 2 MG capsule Take 1 capsule (2 mg total) by mouth See admin instructions. With onset of loose stool, take 4mg  followed by 2mg  every 2 hours until 12 hours have passed without loose bowel movement. Maximum: 16 mg/day (Patient not taking: Reported on 04/06/2019) 60 capsule 1  . Multiple Vitamin (MULTIVITAMIN) capsule Take 1 capsule by mouth daily.    Marland Kitchen oxyCODONE (ROXICODONE) 5 MG immediate release tablet Take 1 tablet (5 mg total) by mouth every 6 (six) hours as needed for severe pain or breakthrough pain. 60 tablet 0   No current facility-administered medications for this visit.     PHYSICAL EXAMINATION: ECOG PERFORMANCE STATUS: 1 - Symptomatic but completely ambulatory Vitals:   04/06/19 1327  BP: 110/86  Pulse: 94  Resp: 18  Temp: (!) 96.6 F (35.9 C)  SpO2: 100%   Filed Weights   04/06/19 1327  Weight: 148 lb 1.6 oz (67.2 kg)    Physical Exam Constitutional:      General: He is not in acute distress.    Comments: Thin built, walk independently  HENT:     Head: Normocephalic and atraumatic.  Eyes:     General: No scleral icterus.    Pupils: Pupils are equal, round, and  reactive to light.  Cardiovascular:     Rate and Rhythm: Normal rate and regular rhythm.     Heart sounds: Normal heart sounds.  Pulmonary:     Effort: Pulmonary effort is normal. No respiratory distress.     Breath sounds: No wheezing.     Comments: Decreased breath sound bilaterally. Abdominal:     General: Bowel sounds are normal. There is no distension.     Palpations: Abdomen is soft. There is no mass.     Tenderness: There is no abdominal tenderness.  Musculoskeletal:        General: No  deformity. Normal range of motion.     Cervical back: Normal range of motion and neck supple.     Comments: Palpable left anterior chest wall/breast pain Possible left gynecomastia  Skin:    General: Skin is warm and dry.     Findings: No erythema or rash.  Neurological:     Mental Status: He is alert and oriented to person, place, and time. Mental status is at baseline.     Cranial Nerves: No cranial nerve deficit.     Coordination: Coordination normal.  Psychiatric:        Mood and Affect: Mood normal.        Behavior: Behavior normal.        Thought Content: Thought content normal.     LABORATORY DATA:  I have reviewed the data as listed Lab Results  Component Value Date   WBC 4.8 04/06/2019   HGB 16.7 04/06/2019   HCT 49.5 04/06/2019   MCV 91.3 04/06/2019   PLT 327 04/06/2019   Recent Labs    03/24/19 0608 03/28/19 1014 04/06/19 1308  NA 140 138 138  K 3.5 3.6 3.8  CL 103 101 100  CO2 27 27 27   GLUCOSE 124* 102* 87  BUN 9 7* 8  CREATININE 0.84 0.69 0.71  CALCIUM 9.1 9.4 9.3  GFRNONAA >60 >60 >60  GFRAA >60 >60 >60  PROT 8.2* 8.5* 8.1  ALBUMIN 3.7 3.8 3.5  AST 45* 43* 36  ALT 41 41 40  ALKPHOS 65 65 78  BILITOT 0.7 0.6 0.6   Iron/TIBC/Ferritin/ %Sat No results found for: IRON, TIBC, FERRITIN, IRONPCTSAT   RADIOGRAPHIC STUDIES: I have personally reviewed the radiological images as listed and agreed with the findings in the report. CT Chest W  Contrast  Result Date: 03/26/2019 CLINICAL DATA:  Restaging left lung cancer. Hepatocellular carcinoma restaging. EXAM: CT CHEST, ABDOMEN, AND PELVIS WITH CONTRAST TECHNIQUE: Multidetector CT imaging of the chest, abdomen and pelvis was performed following the standard protocol during bolus administration of intravenous contrast. CONTRAST:  197mL OMNIPAQUE IOHEXOL 300 MG/ML  SOLN COMPARISON:  03/24/2019 and 11/29/2018 FINDINGS: CT CHEST FINDINGS Cardiovascular: Stable ascending thoracic aortic aneurysm, 4.2 cm in diameter. Coronary, aortic arch, and branch vessel atherosclerotic vascular disease. Prominence of the azygos vein. Mediastinum/Nodes: No pathologic adenopathy identified. Lungs/Pleura: Prominent emphysema. Bilateral airway thickening. Architectural distortion, scarring, and some chronic nodularity in the left upper lobe, much of which is likely therapy related, and not appreciably changed. Musculoskeletal: Stable mild left paraspinal density at the T11 level with residual erosion along the left anterior T11 vertebral body. The soft tissue density adjacent to T11 is difficult to completely separate from the adjacent azygos vein but measures about 1.6 by 1.5 cm on image 48/2, and minimally are erodes the adjacent left lateral cortex of T11. This is roughly stable from 03/24/2019 exam. CT ABDOMEN PELVIS FINDINGS Hepatobiliary: Sharply defined segment 8 mass with mild internal heterogeneity measures 3.1 by 2.9 cm, previously 3.2 by 2.9 cm. On the prior exam was query whether this lesion occluding part of the hepatic vein, certainly the lesion is closely associated with the right hepatic vein although definite occlusion is not observed. Adjacent rim enhancing lesion in segment 81.5 by 1.5 cm, stable. A segment 6 lesion with central washout and peripheral enhancement measures about 2.2 by 2.4 cm on image 65/2, stable. Ill-defined hypodense lesion posteriorly in the right hepatic lobe approximately 0.7 cm in  diameter on image 71/2. Overall the hepatic lesions are unchanged.  1.3 cm gallstone in the gallbladder. The gallbladder is mildly contracted. No biliary dilatation. Pancreas: Unremarkable Spleen: Unremarkable Adrenals/Urinary Tract: Both adrenal glands appear normal. Stable right renal hypodense lesions, likely cysts. Stomach/Bowel: Swirling of the small bowel mesentery in the central pelvis but without dilated bowel to suggest a significant volvulus. Stable postoperative findings along proximal sigmoid colon margin. Vascular/Lymphatic: Aortoiliac atherosclerotic vascular disease. No pathologic adenopathy identified. Reproductive: Speckled calcifications along the tunica albuginea the penis. Prostate gland unchanged. Other: No supplemental non-categorized findings. Musculoskeletal: Bridging spurring of both sacroiliac joints. Grade 1 anterolisthesis at L5-S1. Lumbar spondylosis and degenerative disc disease causing multilevel impingement most striking at L5-S1. IMPRESSION: 1. Essentially stable appearance of the hepatic metastatic lesions, not unexpected given that prior exam was from 2 days ago. The left paraspinal soft tissue density at T11 is likewise stable. 2. Stable architectural distortion, scarring, and chronic nodularity in the left upper lobe, much of which is likely therapy related. 3. Swirling of the small bowel mesenteric vessels in the pelvis, but no dilated bowel to suggest a significant volvulus. 4. Other imaging findings of potential clinical significance: Airway thickening is present, suggesting bronchitis or reactive airways disease. Stable ascending thoracic aortic aneurysm, 4.2 cm in diameter. Cholelithiasis. Lumbar spondylosis and degenerative disc disease causing multilevel impingement most striking at L5-S1. Bridging spurring of both sacroiliac joints. Aortic Atherosclerosis (ICD10-I70.0) and Emphysema (ICD10-J43.9). Electronically Signed   By: Van Clines M.D.   On: 03/26/2019 10:58    CT Angio Chest PE W and/or Wo Contrast  Result Date: 03/24/2019 CLINICAL DATA:  68 year old male with chest pain. History of hepatocellular carcinoma/hepatocellular adenocarcinoma with a positive biopsy in the posterior mediastinum. Subsequent biopsy of liver mass was negative. EXAM: CT ANGIOGRAPHY CHEST CT ABDOMEN AND PELVIS WITH CONTRAST TECHNIQUE: Multidetector CT imaging of the chest was performed using the standard protocol during bolus administration of intravenous contrast. Multiplanar CT image reconstructions and MIPs were obtained to evaluate the vascular anatomy. Multidetector CT imaging of the abdomen and pelvis was performed using the standard protocol during bolus administration of intravenous contrast. CONTRAST:  54mL OMNIPAQUE IOHEXOL 350 MG/ML SOLN COMPARISON:  11/29/2018, CT 06/18/2014, 07/26/2017, 06/30/2018 FINDINGS: CTA CHEST FINDINGS Cardiovascular: Heart: Heart size unchanged. No pericardial fluid/thickening. Calcifications of the left main, left anterior descending, circumflex, right coronary arteries. Aorta: Greatest diameter of the ascending aorta estimated 42 mm which is unchanged from the comparison. Opacification of the aorta it is limited given the timing of the contrast bolus for the pulmonary arteries. Common origin of the innominate artery an the left common carotid artery. Mild atherosclerosis of the aortic arch. Pulmonary arteries: No central, lobar, segmental, or proximal subsegmental filling defects. Pulmonary arteries are attenuated within the left upper lobe secondary to treatment effect. Mediastinum/Nodes: No mediastinal adenopathy. Unremarkable appearance of the thoracic esophagus. Nodularity of the posterior mediastinum is significantly decreased as compared to the pre treatment CT of 06/30/2018. Unremarkable thoracic inlet. Lungs/Pleura: Advanced paraseptal and centrilobular emphysema, including bullous changes at the bilateral lung apices. Redemonstration of treatment  changes of the left upper lung, with architectural distortion, scarring, and pleural thickening persisting. Subpleural reticulation at the right lower lobe lung base. No confluent airspace disease. No pleural effusion. Review of the MIP images confirms the above findings. CT ABDOMEN and PELVIS FINDINGS Hepatobiliary: Redemonstration of the heterogeneous mass within the right liver, segment 8, with well-defined border and a diameter of 2.7 cm, unchanged. Compared to prior CT there is a new satellite nodule extending inferior and lateral to this  lesion on image 15 of series 4. Second hepatic lesion measuring 2.1 cm within segment 6 on image 22 of series 4, new. Third a [place] lesion segment 6 at the tip of the liver, image 27 of series 4 Additionally there is a heterogeneously hypoattenuating/enhancing region of the left liver with what appears to be occlusion of the left hepatic vein (image 14 of series 4. Hyper dense material in the dependent aspect of the gallbladder, without inflammatory changes. Pancreas: Unremarkable Spleen: Unremarkable Adrenals/Urinary Tract: - Right adrenal gland:  Unremarkable - Left adrenal gland: Unremarkable. - Right kidney: No hydronephrosis, nephrolithiasis, inflammation, or ureteral dilation. Rounded lesions on the lateral cortex of the right kidney, most likely benign, unchanged - Left Kidney: No hydronephrosis, nephrolithiasis, inflammation, or ureteral dilation. No focal lesion. - Urinary Bladder: Unremarkable. Stomach/Bowel: - Stomach: Hiatal hernia with otherwise unremarkable stomach - Small bowel: Unremarkable - Appendix: Normal - Colon: Mild stool burden.  No focal inflammatory changes. Vascular/Lymphatic: Atherosclerotic calcifications of the abdominal aorta and bilateral iliac arteries. Proximal femoral arteries remain patent. No pelvic or abdominal adenopathy. Reproductive: Transverse diameter of the prostate measures 41 mm Other: Surgical changes along the midline abdomen  with laxity in the midline. Small bowel loops are immediately subjacent to the midline laparotomy site Musculoskeletal: Degenerative changes of the thoracolumbar spine. Vacuum disc phenomenon spans all levels of the lumbar spine. Lytic lesion of T11 with sclerotic border. While this is unchanged in size from the most recent comparison CT, it is new from 2019 and in the region of the pathologically proven tumor of the posterior mediastinum. Review of the MIP images confirms the above findings. IMPRESSION: CT negative for pulmonary emboli, with no acute finding to account for chest pain. Progression of hepatic malignancy, with at least 3 new hepatic lesions, separate from the previously biopsied lesion of the right liver. In addition there is questionable new infiltrative disease of the left liver which appears to occlude the left hepatic vein. Referral for follow-up with oncology is indicated, as well as consideration of contrast-enhanced liver MRI. Treatment changes of the left upper lobe without evidence of local recurrence/progression. In addition, the T11 malignant lytic lesion and biopsy proven tumor of left posterior mediastinal/paravertebral T11 level are unchanged. Advanced emphysema.  Emphysema (ICD10-J43.9). Aortic atherosclerosis and iliac arterial disease. Aortic Atherosclerosis (ICD10-I70.0). Unchanged diameter of ascending aorta, 4.2 cm. Recommend annual imaging followup by CTA or MRA. This recommendation follows 2010 ACCF/AHA/AATS/ACR/ASA/SCA/SCAI/SIR/STS/SVM Guidelines for the Diagnosis and Management of Patients with Thoracic Aortic Disease. Circulation. 2010; 121: N462-V035. Aortic aneurysm NOS (ICD10-I71.9) Electronically Signed   By: Corrie Mckusick D.O.   On: 03/24/2019 08:53   CT Abdomen Pelvis W Contrast  Result Date: 03/26/2019 CLINICAL DATA:  Restaging left lung cancer. Hepatocellular carcinoma restaging. EXAM: CT CHEST, ABDOMEN, AND PELVIS WITH CONTRAST TECHNIQUE: Multidetector CT imaging  of the chest, abdomen and pelvis was performed following the standard protocol during bolus administration of intravenous contrast. CONTRAST:  149mL OMNIPAQUE IOHEXOL 300 MG/ML  SOLN COMPARISON:  03/24/2019 and 11/29/2018 FINDINGS: CT CHEST FINDINGS Cardiovascular: Stable ascending thoracic aortic aneurysm, 4.2 cm in diameter. Coronary, aortic arch, and branch vessel atherosclerotic vascular disease. Prominence of the azygos vein. Mediastinum/Nodes: No pathologic adenopathy identified. Lungs/Pleura: Prominent emphysema. Bilateral airway thickening. Architectural distortion, scarring, and some chronic nodularity in the left upper lobe, much of which is likely therapy related, and not appreciably changed. Musculoskeletal: Stable mild left paraspinal density at the T11 level with residual erosion along the left anterior T11 vertebral body. The  soft tissue density adjacent to T11 is difficult to completely separate from the adjacent azygos vein but measures about 1.6 by 1.5 cm on image 48/2, and minimally are erodes the adjacent left lateral cortex of T11. This is roughly stable from 03/24/2019 exam. CT ABDOMEN PELVIS FINDINGS Hepatobiliary: Sharply defined segment 8 mass with mild internal heterogeneity measures 3.1 by 2.9 cm, previously 3.2 by 2.9 cm. On the prior exam was query whether this lesion occluding part of the hepatic vein, certainly the lesion is closely associated with the right hepatic vein although definite occlusion is not observed. Adjacent rim enhancing lesion in segment 81.5 by 1.5 cm, stable. A segment 6 lesion with central washout and peripheral enhancement measures about 2.2 by 2.4 cm on image 65/2, stable. Ill-defined hypodense lesion posteriorly in the right hepatic lobe approximately 0.7 cm in diameter on image 71/2. Overall the hepatic lesions are unchanged. 1.3 cm gallstone in the gallbladder. The gallbladder is mildly contracted. No biliary dilatation. Pancreas: Unremarkable Spleen:  Unremarkable Adrenals/Urinary Tract: Both adrenal glands appear normal. Stable right renal hypodense lesions, likely cysts. Stomach/Bowel: Swirling of the small bowel mesentery in the central pelvis but without dilated bowel to suggest a significant volvulus. Stable postoperative findings along proximal sigmoid colon margin. Vascular/Lymphatic: Aortoiliac atherosclerotic vascular disease. No pathologic adenopathy identified. Reproductive: Speckled calcifications along the tunica albuginea the penis. Prostate gland unchanged. Other: No supplemental non-categorized findings. Musculoskeletal: Bridging spurring of both sacroiliac joints. Grade 1 anterolisthesis at L5-S1. Lumbar spondylosis and degenerative disc disease causing multilevel impingement most striking at L5-S1. IMPRESSION: 1. Essentially stable appearance of the hepatic metastatic lesions, not unexpected given that prior exam was from 2 days ago. The left paraspinal soft tissue density at T11 is likewise stable. 2. Stable architectural distortion, scarring, and chronic nodularity in the left upper lobe, much of which is likely therapy related. 3. Swirling of the small bowel mesenteric vessels in the pelvis, but no dilated bowel to suggest a significant volvulus. 4. Other imaging findings of potential clinical significance: Airway thickening is present, suggesting bronchitis or reactive airways disease. Stable ascending thoracic aortic aneurysm, 4.2 cm in diameter. Cholelithiasis. Lumbar spondylosis and degenerative disc disease causing multilevel impingement most striking at L5-S1. Bridging spurring of both sacroiliac joints. Aortic Atherosclerosis (ICD10-I70.0) and Emphysema (ICD10-J43.9). Electronically Signed   By: Van Clines M.D.   On: 03/26/2019 10:58   CT ABDOMEN PELVIS W CONTRAST  Result Date: 03/24/2019 CLINICAL DATA:  69 year old male with chest pain. History of hepatocellular carcinoma/hepatocellular adenocarcinoma with a positive biopsy  in the posterior mediastinum. Subsequent biopsy of liver mass was negative. EXAM: CT ANGIOGRAPHY CHEST CT ABDOMEN AND PELVIS WITH CONTRAST TECHNIQUE: Multidetector CT imaging of the chest was performed using the standard protocol during bolus administration of intravenous contrast. Multiplanar CT image reconstructions and MIPs were obtained to evaluate the vascular anatomy. Multidetector CT imaging of the abdomen and pelvis was performed using the standard protocol during bolus administration of intravenous contrast. CONTRAST:  59mL OMNIPAQUE IOHEXOL 350 MG/ML SOLN COMPARISON:  11/29/2018, CT 06/18/2014, 07/26/2017, 06/30/2018 FINDINGS: CTA CHEST FINDINGS Cardiovascular: Heart: Heart size unchanged. No pericardial fluid/thickening. Calcifications of the left main, left anterior descending, circumflex, right coronary arteries. Aorta: Greatest diameter of the ascending aorta estimated 42 mm which is unchanged from the comparison. Opacification of the aorta it is limited given the timing of the contrast bolus for the pulmonary arteries. Common origin of the innominate artery an the left common carotid artery. Mild atherosclerosis of the aortic arch. Pulmonary arteries:  No central, lobar, segmental, or proximal subsegmental filling defects. Pulmonary arteries are attenuated within the left upper lobe secondary to treatment effect. Mediastinum/Nodes: No mediastinal adenopathy. Unremarkable appearance of the thoracic esophagus. Nodularity of the posterior mediastinum is significantly decreased as compared to the pre treatment CT of 06/30/2018. Unremarkable thoracic inlet. Lungs/Pleura: Advanced paraseptal and centrilobular emphysema, including bullous changes at the bilateral lung apices. Redemonstration of treatment changes of the left upper lung, with architectural distortion, scarring, and pleural thickening persisting. Subpleural reticulation at the right lower lobe lung base. No confluent airspace disease. No pleural  effusion. Review of the MIP images confirms the above findings. CT ABDOMEN and PELVIS FINDINGS Hepatobiliary: Redemonstration of the heterogeneous mass within the right liver, segment 8, with well-defined border and a diameter of 2.7 cm, unchanged. Compared to prior CT there is a new satellite nodule extending inferior and lateral to this lesion on image 15 of series 4. Second hepatic lesion measuring 2.1 cm within segment 6 on image 22 of series 4, new. Third a [place] lesion segment 6 at the tip of the liver, image 27 of series 4 Additionally there is a heterogeneously hypoattenuating/enhancing region of the left liver with what appears to be occlusion of the left hepatic vein (image 14 of series 4. Hyper dense material in the dependent aspect of the gallbladder, without inflammatory changes. Pancreas: Unremarkable Spleen: Unremarkable Adrenals/Urinary Tract: - Right adrenal gland:  Unremarkable - Left adrenal gland: Unremarkable. - Right kidney: No hydronephrosis, nephrolithiasis, inflammation, or ureteral dilation. Rounded lesions on the lateral cortex of the right kidney, most likely benign, unchanged - Left Kidney: No hydronephrosis, nephrolithiasis, inflammation, or ureteral dilation. No focal lesion. - Urinary Bladder: Unremarkable. Stomach/Bowel: - Stomach: Hiatal hernia with otherwise unremarkable stomach - Small bowel: Unremarkable - Appendix: Normal - Colon: Mild stool burden.  No focal inflammatory changes. Vascular/Lymphatic: Atherosclerotic calcifications of the abdominal aorta and bilateral iliac arteries. Proximal femoral arteries remain patent. No pelvic or abdominal adenopathy. Reproductive: Transverse diameter of the prostate measures 41 mm Other: Surgical changes along the midline abdomen with laxity in the midline. Small bowel loops are immediately subjacent to the midline laparotomy site Musculoskeletal: Degenerative changes of the thoracolumbar spine. Vacuum disc phenomenon spans all levels  of the lumbar spine. Lytic lesion of T11 with sclerotic border. While this is unchanged in size from the most recent comparison CT, it is new from 2019 and in the region of the pathologically proven tumor of the posterior mediastinum. Review of the MIP images confirms the above findings. IMPRESSION: CT negative for pulmonary emboli, with no acute finding to account for chest pain. Progression of hepatic malignancy, with at least 3 new hepatic lesions, separate from the previously biopsied lesion of the right liver. In addition there is questionable new infiltrative disease of the left liver which appears to occlude the left hepatic vein. Referral for follow-up with oncology is indicated, as well as consideration of contrast-enhanced liver MRI. Treatment changes of the left upper lobe without evidence of local recurrence/progression. In addition, the T11 malignant lytic lesion and biopsy proven tumor of left posterior mediastinal/paravertebral T11 level are unchanged. Advanced emphysema.  Emphysema (ICD10-J43.9). Aortic atherosclerosis and iliac arterial disease. Aortic Atherosclerosis (ICD10-I70.0). Unchanged diameter of ascending aorta, 4.2 cm. Recommend annual imaging followup by CTA or MRA. This recommendation follows 2010 ACCF/AHA/AATS/ACR/ASA/SCA/SCAI/SIR/STS/SVM Guidelines for the Diagnosis and Management of Patients with Thoracic Aortic Disease. Circulation. 2010; 121: U882-C003. Aortic aneurysm NOS (ICD10-I71.9) Electronically Signed   By: Corrie Mckusick D.O.  On: 03/24/2019 08:53   US BIOPSY (LIVER)  Result Date: 03/30/2019 INDICATION: History of lung cancer and hepatocellular carcinoma. New right inferior hepatic lesion EXAM: ULTRASOUND GUIDED CORE BIOPSY OF NEW RIGHT INFERIOR HEPATIC LESION MEDICATIONS: 1% LIDOCAINE LOCAL ANESTHESIA/SEDATION: Fentanyl 100 mcg IV; Versed 2.0 mg IV Moderate Sedation Time:  13 MINUTES The patient was continuously monitored during the procedure by the interventional  radiology nurse under my direct supervision. PROCEDURE: The procedure, risks, benefits, and alternatives were explained to the patient. Questions regarding the procedure were encouraged and answered. The patient understands and consents to the procedure. The right anterior subcostal area was prepped with ChloraPrep in a sterile fashion, and a sterile drape was applied covering the operative field. A sterile gown and sterile gloves were used for the procedure. Local anesthesia was provided with 1% Lidocaine. previous imaging reviewed. Preliminary ultrasound performed of the right hepatic lobe from a right upper quadrant subcostal approach. The right inferior hepatic lesion was localized and marked. Under sterile conditions and local anesthesia, a 17 gauge coaxial guide was advanced to the lesion. Images obtained for documentation. 2 18 gauge core biopsies obtained. Samples were intact and non fragmented. These were placed in formalin. Needle removed. Needle tract occluded with Gel-Foam. Postprocedure imaging demonstrates no hemorrhage or hematoma. Patient tolerated biopsy well. COMPLICATIONS: None immediate. FINDINGS: Imaging confirms needle placement in the right inferior hepatic lesion for core biopsy IMPRESSION: Successful ultrasound right inferior hepatic lesion 18 gauge core biopsies Electronically Signed   By: Jerilynn Mages.  Shick M.D.   On: 03/30/2019 10:34   DG Chest Port 1 View  Result Date: 03/24/2019 CLINICAL DATA:  Acute chest pain. Patient with LEFT lung cancer. EXAM: PORTABLE CHEST 1 VIEW COMPARISON:  08/01/2017 FINDINGS: Cardiomediastinal silhouette is unchanged. Scarring within the UPPER LEFT lung again noted. Emphysema again identified. There is no evidence of focal airspace disease, pulmonary edema, suspicious pulmonary nodule/mass, pleural effusion, or pneumothorax. No acute bony abnormalities are identified. IMPRESSION: No evidence of acute cardiopulmonary disease. Electronically Signed   By: Margarette Canada  M.D.   On: 03/24/2019 07:39      ASSESSMENT & PLAN:  1. Stage IV adenocarcinoma of lung, unspecified laterality ([place])   2. Bone lesion   3. 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  03/30/19 liver lesion biopsy showed poorly differentiated adenocarcinoma, compatible with lung primary.  He also has Stage IV HCC-07/17/2018 paraspinal soft tissue biopsy Moderately differentiated HCC- treatment with Lenvatinib, AFP nadired at 12.6, now trending up  The diagnosis and care plan were discussed with patient in detail.   The goal of treatment which is to palliate disease, disease related symptoms, improve quality of life and hopefully prolong life was highlighted in our discussion. Patient understands that his condition is not curable.  Chemotherapy education was provided.  We had discussed the composition of chemotherapy regimen, length of chemo cycle, duration of treatment and the time to assess response to treatment.    I explained to the patient the risks and benefits of chemotherapy regimen that can be effective for both lung adenocarcinoma and HCC histology-  carboplatin/Taxol/atezolizumab/Bevacizumab every 3 weeks,  including all but not limited to hair loss, mouth sore, nausea, vomiting, diarrhea, low blood counts, bleeding, stroke, heart attack, neuropathy and risk of life threatening infection and even death etc.    I discussed the mechanism of action and rationale of using immunotherapy.  The goal of therapy is palliative; and length of treatments are likely  ongoing/based upon the results of the scans. Discussed the potential side effects of immunotherapy including but not limited to diarrhea; skin rash; respiratory failure, kidney failure, mental status change, elevated LFTs/liver failure,endocrine abnormalities, acute deterioration  and even death,etc. Supportive care measures are necessary for patient well-being and will be provided as necessary.  patient and daughter  understanding and agrees with proceeding with treatment.   # Stop Lenvatinib, # Chemotherapy education; Medi- port placement. Antiemetics-Zofran, Compazine; EMLA cream sent to pharmacy  Left anterior chest wall pain ?left breast gynecomastia Check bone scan.  Pain management: oxycodone 5mg  Q6 hours as needed.  Fentanyl patch 12.22mcg Q72 hours.   #Sclerotic T11 lesion.  Continue Zometa every 4 to 6 weeks.  Patient declines taking calcium supplementation as it causes rash.  Proceed with Zometa today.   # Follow up with palliative care   We spent sufficient time to discuss many aspect of care, questions were answered to patient's satisfaction. All questions were answered. The patient knows to call the clinic with any problems questions or concerns.   Earlie Server, MD, PhD 04/06/2019

## 2019-04-07 LAB — AFP TUMOR MARKER: AFP, Serum, Tumor Marker: 11.8 ng/mL — ABNORMAL HIGH (ref 0.0–8.3)

## 2019-04-08 ENCOUNTER — Ambulatory Visit: Payer: Medicare Other | Attending: Internal Medicine

## 2019-04-08 ENCOUNTER — Other Ambulatory Visit (INDEPENDENT_AMBULATORY_CARE_PROVIDER_SITE_OTHER): Payer: Self-pay | Admitting: Nurse Practitioner

## 2019-04-08 DIAGNOSIS — Z23 Encounter for immunization: Secondary | ICD-10-CM | POA: Insufficient documentation

## 2019-04-08 NOTE — Progress Notes (Signed)
   [id number]  Name:  Samuel Hood.    MRN: [id number] DOB: [birth year]  04/08/2019  Samuel Hood was observed post Covid-19 immunization for 15 minutes without incidence. He was provided with Vaccine Information Sheet and instruction to access the V-Safe system.   Samuel Hood was instructed to call 911 with any severe reactions post vaccine: Marland Kitchen Difficulty breathing  . Swelling of your face and throat  . A fast heartbeat  . A bad rash all over your body  . Dizziness and weakness    Immunizations Administered    Name Date Dose VIS Date Route   Pfizer COVID-19 Vaccine 04/08/2019 10:39 AM 0.3 mL 01/26/2019 Intramuscular   Manufacturer: [employer]   Lot: [id number]   [place]: [id number]

## 2019-04-09 ENCOUNTER — Other Ambulatory Visit: Payer: Self-pay

## 2019-04-09 ENCOUNTER — Other Ambulatory Visit
Admission: RE | Admit: 2019-04-09 | Discharge: 2019-04-09 | Disposition: A | Discharge status: Home / Self Care | Payer: Medicare Other | Source: Ambulatory Visit | Attending: Vascular Surgery | Admitting: Vascular Surgery

## 2019-04-09 DIAGNOSIS — Z01812 Encounter for preprocedural laboratory examination: Secondary | ICD-10-CM | POA: Diagnosis not present

## 2019-04-09 DIAGNOSIS — Z20822 Contact with and (suspected) exposure to covid-19: Secondary | ICD-10-CM | POA: Diagnosis not present

## 2019-04-09 LAB — SARS CORONAVIRUS 2 (TAT 6-24 HRS): SARS Coronavirus 2: NEGATIVE

## 2019-04-09 MED ORDER — CEFAZOLIN SODIUM-DEXTROSE 2-4 GM/100ML-% IV SOLN
2.0000 g | Freq: Once | INTRAVENOUS | Status: AC
  Administered 2019-04-10: 2 g via INTRAVENOUS

## 2019-04-10 ENCOUNTER — Ambulatory Visit: Payer: Medicare Other

## 2019-04-10 ENCOUNTER — Other Ambulatory Visit: Payer: Medicare Other

## 2019-04-10 ENCOUNTER — Encounter
Admission: RE | Disposition: A | Discharge status: Home / Self Care | Payer: Self-pay | Source: Home / Self Care | Attending: Vascular Surgery

## 2019-04-10 ENCOUNTER — Ambulatory Visit: Payer: Medicare Other | Admitting: Oncology

## 2019-04-10 ENCOUNTER — Ambulatory Visit
Admission: RE | Admit: 2019-04-10 | Discharge: 2019-04-10 | Disposition: A | Discharge status: Home / Self Care | Payer: Medicare Other | Attending: Vascular Surgery | Admitting: Vascular Surgery

## 2019-04-10 ENCOUNTER — Encounter: Payer: Self-pay | Admitting: Vascular Surgery

## 2019-04-10 ENCOUNTER — Encounter: Payer: Self-pay | Admitting: Oncology

## 2019-04-10 DIAGNOSIS — C7802 Secondary malignant neoplasm of left lung: Secondary | ICD-10-CM | POA: Diagnosis not present

## 2019-04-10 DIAGNOSIS — C3492 Malignant neoplasm of unspecified part of left bronchus or lung: Secondary | ICD-10-CM | POA: Insufficient documentation

## 2019-04-10 DIAGNOSIS — C7951 Secondary malignant neoplasm of bone: Secondary | ICD-10-CM | POA: Diagnosis not present

## 2019-04-10 DIAGNOSIS — C220 Liver cell carcinoma: Secondary | ICD-10-CM | POA: Insufficient documentation

## 2019-04-10 DIAGNOSIS — I10 Essential (primary) hypertension: Secondary | ICD-10-CM | POA: Diagnosis not present

## 2019-04-10 DIAGNOSIS — Z809 Family history of malignant neoplasm, unspecified: Secondary | ICD-10-CM | POA: Diagnosis not present

## 2019-04-10 DIAGNOSIS — C3490 Malignant neoplasm of unspecified part of unspecified bronchus or lung: Secondary | ICD-10-CM

## 2019-04-10 DIAGNOSIS — Z888 Allergy status to other drugs, medicaments and biological substances status: Secondary | ICD-10-CM | POA: Insufficient documentation

## 2019-04-10 DIAGNOSIS — Z87891 Personal history of nicotine dependence: Secondary | ICD-10-CM | POA: Insufficient documentation

## 2019-04-10 DIAGNOSIS — Z79899 Other long term (current) drug therapy: Secondary | ICD-10-CM | POA: Diagnosis not present

## 2019-04-10 HISTORY — PX: PORTA CATH INSERTION: CATH118285

## 2019-04-10 SURGERY — PORTA CATH INSERTION
Anesthesia: Moderate Sedation

## 2019-04-10 MED ORDER — DIPHENHYDRAMINE HCL 50 MG/ML IJ SOLN: 50.0000 mg | Freq: Once | INTRAMUSCULAR | Status: DC | PRN

## 2019-04-10 MED ORDER — SODIUM CHLORIDE 0.9 % IV SOLN
INTRAVENOUS | Status: DC
  Administered 2019-04-10: 1000 mL via INTRAVENOUS

## 2019-04-10 MED ORDER — FAMOTIDINE 20 MG PO TABS: 40.0000 mg | ORAL_TABLET | Freq: Once | ORAL | Status: DC | PRN

## 2019-04-10 MED ORDER — MIDAZOLAM HCL 2 MG/2ML IJ SOLN
INTRAMUSCULAR | Status: DC | PRN
  Administered 2019-04-10: 2 mg via INTRAVENOUS

## 2019-04-10 MED ORDER — FENTANYL CITRATE (PF) 100 MCG/2ML IJ SOLN
INTRAMUSCULAR | Status: AC
  Filled 2019-04-10: qty 2

## 2019-04-10 MED ORDER — MIDAZOLAM HCL 2 MG/ML PO SYRP: 8.0000 mg | ORAL_SOLUTION | Freq: Once | ORAL | Status: DC | PRN

## 2019-04-10 MED ORDER — MIDAZOLAM HCL 5 MG/5ML IJ SOLN
INTRAMUSCULAR | Status: AC
  Filled 2019-04-10: qty 5

## 2019-04-10 MED ORDER — METHYLPREDNISOLONE SODIUM SUCC 125 MG IJ SOLR: 125.0000 mg | Freq: Once | INTRAMUSCULAR | Status: DC | PRN

## 2019-04-10 MED ORDER — FENTANYL CITRATE (PF) 100 MCG/2ML IJ SOLN
INTRAMUSCULAR | Status: DC | PRN
  Administered 2019-04-10: 50 ug via INTRAVENOUS

## 2019-04-10 SURGICAL SUPPLY — 12 items
DERMABOND ADVANCED (GAUZE/BANDAGES/DRESSINGS) ×2
DERMABOND ADVANCED .7 DNX12 (GAUZE/BANDAGES/DRESSINGS) IMPLANT
DRAPE INCISE IOBAN 66X45 STRL (DRAPES) ×3 IMPLANT
KIT PORT POWER 8FR ISP CVUE (Port) ×2 IMPLANT
NDL ENTRY 21GA 7CM ECHOTIP (NEEDLE) IMPLANT
NEEDLE ENTRY 21GA 7CM ECHOTIP (NEEDLE) ×3 IMPLANT
PACK ANGIOGRAPHY (CUSTOM PROCEDURE TRAY) ×3 IMPLANT
SET INTRO CAPELLA COAXIAL (SET/KITS/TRAYS/PACK) ×2 IMPLANT
SUT MNCRL AB 4-0 PS2 18 (SUTURE) ×3 IMPLANT
SUT PROLENE 0 CT 1 30 (SUTURE) ×3 IMPLANT
SUT VIC AB 3-0 SH 27 (SUTURE) ×2
SUT VIC AB 3-0 SH 27X BRD (SUTURE) IMPLANT

## 2019-04-10 NOTE — H&P (Signed)
[facility] History & Physical Update  The patient was interviewed and re-examined.  The patient's previous History and Physical has been reviewed and is unchanged.  There is no change in the plan of care. We plan to proceed with the scheduled procedure.  Patient has biopsy-proven non-small cell metastatic lung carcinoma with left upper lobe involvement.  He is followed by Dr. Tasia Catchings and port placement for appropriate parenteral access so he may receive his chemotherapy has been discussed.  We will proceed with port placement.  Hortencia Pilar, MD  04/10/2019, 9:01 AM

## 2019-04-10 NOTE — Op Note (Signed)
OPERATIVE NOTE   PROCEDURE: 1. Placement of a right IJ Infuse-a-Port  PRE-OPERATIVE DIAGNOSIS: Small cell lung carcinoma metastatic  POST-OPERATIVE DIAGNOSIS: Same  SURGEON: Katha Cabal M.D.  ANESTHESIA: Conscious sedation was administered under my direct supervision by the interventional radiology RN. IV Versed plus fentanyl were utilized. Continuous ECG, pulse oximetry and blood pressure was monitored throughout the entire procedure. Conscious sedation was for a total of 30 minutes.  ESTIMATED BLOOD LOSS: Minimal   FINDING(S): 1.  Patent vein  SPECIMEN(S): None  INDICATIONS:   Samuel Hood. is a 69 y.o. male who presents with metastatic small cell lung carcinoma.  He will be receiving chemotherapy and therefore needs appropriate parenteral access.  Infuse-a-Port has been discussed and the patient has agreed to proceed.  DESCRIPTION: After obtaining full informed written consent, the patient was brought back to the special procedure suite and placed in the supine position. The patient's right neck and chest wall are prepped and draped in sterile fashion. Appropriate timeout was called.  Ultrasound is placed in a sterile sleeve, ultrasound is utilized to avoid vascular injury as well as secondary to lack of appropriate landmarks. The right internal jugular vein is identified. It is echolucent and homogeneous as well as easily compressible indicating patency. An image is recorded for the permanent record.  Access to the vein with a micropuncture needle is done under direct ultrasound visualization.  1% lidocaine is infiltrated into the soft tissue at the base of the neck as well as on the chest wall.  Under direct ultrasound visualization a micro-needle is inserted into the vein followed by the micro-wire. Micro-sheath was then advanced and a J wire is inserted without difficulty under fluoroscopic guidance. A small counterincision was created at the wire insertion site. A  transverse incision is created 2 fingerbreadths below the scapula and a pocket is fashioned using both blunt and sharp dissection. The pocket is tested for appropriate size with the hub of the Infuse-a-Port. The tunneling device is then used to pull the intravascular portion of the catheter from the pocket to the neck counterincision.  Dilator and peel-away sheath were then inserted over the wire and the wire is removed. Catheter is then advanced into the venous system without difficulty. Peel-away sheath was then removed.  Catheter is then positioned under fluoroscopic guidance at the atrial caval junction. It is then transected connected to the hub and the hope is slipped into the subcutaneous pocket on the chest wall. The hub was then accessed percutaneously and aspirates easily and flushes well and is flushed with 30 cc of heparinized saline. The pocket incision is then closed in layers using interrupted 3-0 Vicryl for the subcutaneous tissues and 4-0 Monocryl subcuticular for skin closure. Dermabond is applied. The neck counterincision was closed with 4-0 Monocryl subcuticular and Dermabond as well.  The patient tolerated the procedure well and there were no immediate complications.  COMPLICATIONS: None  CONDITION: Unchanged  Katha Cabal M.D. [facility] Office: [phone]   04/10/2019, 10:12 AM

## 2019-04-11 NOTE — Patient Instructions (Signed)
Atezolizumab injection What is this medicine? ATEZOLIZUMAB (a te zoe LIZ ue mab) is a monoclonal antibody. It is used to treat bladder cancer (urothelial cancer), liver cancer, lung cancer, breast cancer, and melanoma. This medicine may be used for other purposes; ask your health care provider or pharmacist if you have questions. COMMON BRAND NAME(S): Tecentriq What should I tell my health care provider before I take this medicine? They need to know if you have any of these conditions:  diabetes  immune system problems  infection  inflammatory bowel disease  liver disease  lung or breathing disease  lupus  nervous system problems like myasthenia gravis or Guillain-Barre syndrome  organ transplant  an unusual or allergic reaction to atezolizumab, other medicines, foods, dyes, or preservatives  pregnant or trying to get pregnant  breast-feeding How should I use this medicine? This medicine is for infusion into a vein. It is given by a health care professional in a hospital or clinic setting. A special MedGuide will be given to you before each treatment. Be sure to read this information carefully each time. Talk to your pediatrician regarding the use of this medicine in children. Special care may be needed. Overdosage: If you think you have taken too much of this medicine contact a poison control center or emergency room at once. NOTE: This medicine is only for you. Do not share this medicine with others. What if I miss a dose? It is important not to miss your dose. Call your doctor or health care professional if you are unable to keep an appointment. What may interact with this medicine? Interactions have not been studied. This list may not describe all possible interactions. Give your health care provider a list of all the medicines, herbs, non-prescription drugs, or dietary supplements you use. Also tell them if you smoke, drink alcohol, or use illegal drugs. Some items may  interact with your medicine. What should I watch for while using this medicine? Your condition will be monitored carefully while you are receiving this medicine. You may need blood work done while you are taking this medicine. Do not become pregnant while taking this medicine or for at least 5 months after stopping it. Women should inform their doctor if they wish to become pregnant or think they might be pregnant. There is a potential for serious side effects to an unborn child. Talk to your health care professional or pharmacist for more information. Do not breast-feed an infant while taking this medicine or for at least 5 months after the last dose. What side effects may I notice from receiving this medicine? Side effects that you should report to your doctor or health care professional as soon as possible:  allergic reactions like skin rash, itching or hives, swelling of the face, lips, or tongue  black, tarry stools  bloody or watery diarrhea  breathing problems  changes in vision  chest pain or chest tightness  chills  facial flushing  fever  headache  signs and symptoms of high blood sugar such as dizziness; dry mouth; dry skin; fruity breath; nausea; stomach pain; increased hunger or thirst; increased urination  signs and symptoms of liver injury like dark yellow or brown urine; general ill feeling or flu-like symptoms; light-colored stools; loss of appetite; nausea; right upper belly pain; unusually weak or tired; yellowing of the eyes or skin  stomach pain  trouble passing urine or change in the amount of urine Side effects that usually do not require medical attention (report to  your doctor or health care professional if they continue or are bothersome):  bone pain  cough  diarrhea  joint pain  muscle pain  muscle weakness  swelling of arms or legs  tiredness  weight loss This list may not describe all possible side effects. Call your doctor for  medical advice about side effects. You may report side effects to FDA at 1-800-FDA-1088. Where should I keep my medicine? This drug is given in a hospital or clinic and will not be stored at home. NOTE: This sheet is a summary. It may not cover all possible information. If you have questions about this medicine, talk to your doctor, pharmacist, or health care provider.  2020 Elsevier/Gold Standard (2018-09-22 13:11:14) Bevacizumab injection What is this medicine? BEVACIZUMAB (be va SIZ yoo mab) is a monoclonal antibody. It is used to treat many types of cancer. This medicine may be used for other purposes; ask your health care provider or pharmacist if you have questions. COMMON BRAND NAME(S): Avastin, MVASI, Zirabev What should I tell my health care provider before I take this medicine? They need to know if you have any of these conditions:  diabetes  heart disease  high blood pressure  history of coughing up blood  prior anthracycline chemotherapy (e.g., doxorubicin, daunorubicin, epirubicin)  recent or ongoing radiation therapy  recent or planning to have surgery  stroke  an unusual or allergic reaction to bevacizumab, hamster proteins, mouse proteins, other medicines, foods, dyes, or preservatives  pregnant or trying to get pregnant  breast-feeding How should I use this medicine? This medicine is for infusion into a vein. It is given by a health care professional in a hospital or clinic setting. Talk to your pediatrician regarding the use of this medicine in children. Special care may be needed. Overdosage: If you think you have taken too much of this medicine contact a poison control center or emergency room at once. NOTE: This medicine is only for you. Do not share this medicine with others. What if I miss a dose? It is important not to miss your dose. Call your doctor or health care professional if you are unable to keep an appointment. What may interact with this  medicine? Interactions are not expected. This list may not describe all possible interactions. Give your health care provider a list of all the medicines, herbs, non-prescription drugs, or dietary supplements you use. Also tell them if you smoke, drink alcohol, or use illegal drugs. Some items may interact with your medicine. What should I watch for while using this medicine? Your condition will be monitored carefully while you are receiving this medicine. You will need important blood work and urine testing done while you are taking this medicine. This medicine may increase your risk to bruise or bleed. Call your doctor or health care professional if you notice any unusual bleeding. Before having surgery, talk to your health care provider to make sure it is ok. This drug can increase the risk of poor healing of your surgical site or wound. You will need to stop this drug for 28 days before surgery. After surgery, wait at least 28 days before restarting this drug. Make sure the surgical site or wound is healed enough before restarting this drug. Talk to your health care provider if questions. Do not become pregnant while taking this medicine or for 6 months after stopping it. Women should inform their doctor if they wish to become pregnant or think they might be pregnant. There is a  potential for serious side effects to an unborn child. Talk to your health care professional or pharmacist for more information. Do not breast-feed an infant while taking this medicine and for 6 months after the last dose. This medicine has caused ovarian failure in some women. This medicine may interfere with the ability to have a child. You should talk to your doctor or health care professional if you are concerned about your fertility. What side effects may I notice from receiving this medicine? Side effects that you should report to your doctor or health care professional as soon as possible:  allergic reactions like skin  rash, itching or hives, swelling of the face, lips, or tongue  chest pain or chest tightness  chills  coughing up blood  high fever  seizures  severe constipation  signs and symptoms of bleeding such as bloody or black, tarry stools; red or dark-brown urine; spitting up blood or brown material that looks like coffee grounds; red spots on the skin; unusual bruising or bleeding from the eye, gums, or nose  signs and symptoms of a blood clot such as breathing problems; chest pain; severe, sudden headache; pain, swelling, warmth in the leg  signs and symptoms of a stroke like changes in vision; confusion; trouble speaking or understanding; severe headaches; sudden numbness or weakness of the face, arm or leg; trouble walking; dizziness; loss of balance or coordination  stomach pain  sweating  swelling of legs or ankles  vomiting  weight gain Side effects that usually do not require medical attention (report to your doctor or health care professional if they continue or are bothersome):  back pain  changes in taste  decreased appetite  dry skin  nausea  tiredness This list may not describe all possible side effects. Call your doctor for medical advice about side effects. You may report side effects to FDA at 1-800-FDA-1088. Where should I keep my medicine? This drug is given in a hospital or clinic and will not be stored at home. NOTE: This sheet is a summary. It may not cover all possible information. If you have questions about this medicine, talk to your doctor, pharmacist, or health care provider.  2020 Elsevier/Gold Standard (2018-11-29 10:50:46) Paclitaxel injection What is this medicine? PACLITAXEL (PAK li TAX el) is a chemotherapy drug. It targets fast dividing cells, like cancer cells, and causes these cells to die. This medicine is used to treat ovarian cancer, breast cancer, lung cancer, Kaposi's sarcoma, and other cancers. This medicine may be used for other  purposes; ask your health care provider or pharmacist if you have questions. COMMON BRAND NAME(S): Onxol, Taxol What should I tell my health care provider before I take this medicine? They need to know if you have any of these conditions:  history of irregular heartbeat  liver disease  low blood counts, like low Gillooly cell, platelet, or red cell counts  lung or breathing disease, like asthma  tingling of the fingers or toes, or other nerve disorder  an unusual or allergic reaction to paclitaxel, alcohol, polyoxyethylated castor oil, other chemotherapy, other medicines, foods, dyes, or preservatives  pregnant or trying to get pregnant  breast-feeding How should I use this medicine? This drug is given as an infusion into a vein. It is administered in a hospital or clinic by a specially trained health care professional. Talk to your pediatrician regarding the use of this medicine in children. Special care may be needed. Overdosage: If you think you have taken too much  of this medicine contact a poison control center or emergency room at once. NOTE: This medicine is only for you. Do not share this medicine with others. What if I miss a dose? It is important not to miss your dose. Call your doctor or health care professional if you are unable to keep an appointment. What may interact with this medicine? Do not take this medicine with any of the following medications:  disulfiram  metronidazole This medicine may also interact with the following medications:  antiviral medicines for hepatitis, HIV or AIDS  certain antibiotics like erythromycin and clarithromycin  certain medicines for fungal infections like ketoconazole and itraconazole  certain medicines for seizures like carbamazepine, phenobarbital, phenytoin  gemfibrozil  nefazodone  rifampin  St. John's wort This list may not describe all possible interactions. Give your health care provider a list of all the  medicines, herbs, non-prescription drugs, or dietary supplements you use. Also tell them if you smoke, drink alcohol, or use illegal drugs. Some items may interact with your medicine. What should I watch for while using this medicine? Your condition will be monitored carefully while you are receiving this medicine. You will need important blood work done while you are taking this medicine. This medicine can cause serious allergic reactions. To reduce your risk you will need to take other medicine(s) before treatment with this medicine. If you experience allergic reactions like skin rash, itching or hives, swelling of the face, lips, or tongue, tell your doctor or health care professional right away. In some cases, you may be given additional medicines to help with side effects. Follow all directions for their use. This drug may make you feel generally unwell. This is not uncommon, as chemotherapy can affect healthy cells as well as cancer cells. Report any side effects. Continue your course of treatment even though you feel ill unless your doctor tells you to stop. Call your doctor or health care professional for advice if you get a fever, chills or sore throat, or other symptoms of a cold or flu. Do not treat yourself. This drug decreases your body's ability to fight infections. Try to avoid being around people who are sick. This medicine may increase your risk to bruise or bleed. Call your doctor or health care professional if you notice any unusual bleeding. Be careful brushing and flossing your teeth or using a toothpick because you may get an infection or bleed more easily. If you have any dental work done, tell your dentist you are receiving this medicine. Avoid taking products that contain aspirin, acetaminophen, ibuprofen, naproxen, or ketoprofen unless instructed by your doctor. These medicines may hide a fever. Do not become pregnant while taking this medicine. Women should inform their doctor if  they wish to become pregnant or think they might be pregnant. There is a potential for serious side effects to an unborn child. Talk to your health care professional or pharmacist for more information. Do not breast-feed an infant while taking this medicine. Men are advised not to father a child while receiving this medicine. This product may contain alcohol. Ask your pharmacist or healthcare provider if this medicine contains alcohol. Be sure to tell all healthcare providers you are taking this medicine. Certain medicines, like metronidazole and disulfiram, can cause an unpleasant reaction when taken with alcohol. The reaction includes flushing, headache, nausea, vomiting, sweating, and increased thirst. The reaction can last from 30 minutes to several hours. What side effects may I notice from receiving this medicine? Side effects  that you should report to your doctor or health care professional as soon as possible:  allergic reactions like skin rash, itching or hives, swelling of the face, lips, or tongue  breathing problems  changes in vision  fast, irregular heartbeat  high or low blood pressure  mouth sores  pain, tingling, numbness in the hands or feet  signs of decreased platelets or bleeding - bruising, pinpoint red spots on the skin, black, tarry stools, blood in the urine  signs of decreased red blood cells - unusually weak or tired, feeling faint or lightheaded, falls  signs of infection - fever or chills, cough, sore throat, pain or difficulty passing urine  signs and symptoms of liver injury like dark yellow or brown urine; general ill feeling or flu-like symptoms; light-colored stools; loss of appetite; nausea; right upper belly pain; unusually weak or tired; yellowing of the eyes or skin  swelling of the ankles, feet, hands  unusually slow heartbeat Side effects that usually do not require medical attention (report to your doctor or health care professional if they  continue or are bothersome):  diarrhea  hair loss  loss of appetite  muscle or joint pain  nausea, vomiting  pain, redness, or irritation at site where injected  tiredness This list may not describe all possible side effects. Call your doctor for medical advice about side effects. You may report side effects to FDA at 1-800-FDA-1088. Where should I keep my medicine? This drug is given in a hospital or clinic and will not be stored at home. NOTE: This sheet is a summary. It may not cover all possible information. If you have questions about this medicine, talk to your doctor, pharmacist, or health care provider.  2020 Elsevier/Gold Standard (2016-10-05 13:14:55) Carboplatin injection What is this medicine? CARBOPLATIN (KAR boe pla tin) is a chemotherapy drug. It targets fast dividing cells, like cancer cells, and causes these cells to die. This medicine is used to treat ovarian cancer and many other cancers. This medicine may be used for other purposes; ask your health care provider or pharmacist if you have questions. COMMON BRAND NAME(S): Paraplatin What should I tell my health care provider before I take this medicine? They need to know if you have any of these conditions:  blood disorders  hearing problems  kidney disease  recent or ongoing radiation therapy  an unusual or allergic reaction to carboplatin, cisplatin, other chemotherapy, other medicines, foods, dyes, or preservatives  pregnant or trying to get pregnant  breast-feeding How should I use this medicine? This drug is usually given as an infusion into a vein. It is administered in a hospital or clinic by a specially trained health care professional. Talk to your pediatrician regarding the use of this medicine in children. Special care may be needed. Overdosage: If you think you have taken too much of this medicine contact a poison control center or emergency room at once. NOTE: This medicine is only for you.  Do not share this medicine with others. What if I miss a dose? It is important not to miss a dose. Call your doctor or health care professional if you are unable to keep an appointment. What may interact with this medicine?  medicines for seizures  medicines to increase blood counts like filgrastim, pegfilgrastim, sargramostim  some antibiotics like amikacin, gentamicin, neomycin, streptomycin, tobramycin  vaccines Talk to your doctor or health care professional before taking any of these medicines:  acetaminophen  aspirin  ibuprofen  ketoprofen  naproxen  This list may not describe all possible interactions. Give your health care provider a list of all the medicines, herbs, non-prescription drugs, or dietary supplements you use. Also tell them if you smoke, drink alcohol, or use illegal drugs. Some items may interact with your medicine. What should I watch for while using this medicine? Your condition will be monitored carefully while you are receiving this medicine. You will need important blood work done while you are taking this medicine. This drug may make you feel generally unwell. This is not uncommon, as chemotherapy can affect healthy cells as well as cancer cells. Report any side effects. Continue your course of treatment even though you feel ill unless your doctor tells you to stop. In some cases, you may be given additional medicines to help with side effects. Follow all directions for their use. Call your doctor or health care professional for advice if you get a fever, chills or sore throat, or other symptoms of a cold or flu. Do not treat yourself. This drug decreases your body's ability to fight infections. Try to avoid being around people who are sick. This medicine may increase your risk to bruise or bleed. Call your doctor or health care professional if you notice any unusual bleeding. Be careful brushing and flossing your teeth or using a toothpick because you may get  an infection or bleed more easily. If you have any dental work done, tell your dentist you are receiving this medicine. Avoid taking products that contain aspirin, acetaminophen, ibuprofen, naproxen, or ketoprofen unless instructed by your doctor. These medicines may hide a fever. Do not become pregnant while taking this medicine. Women should inform their doctor if they wish to become pregnant or think they might be pregnant. There is a potential for serious side effects to an unborn child. Talk to your health care professional or pharmacist for more information. Do not breast-feed an infant while taking this medicine. What side effects may I notice from receiving this medicine? Side effects that you should report to your doctor or health care professional as soon as possible:  allergic reactions like skin rash, itching or hives, swelling of the face, lips, or tongue  signs of infection - fever or chills, cough, sore throat, pain or difficulty passing urine  signs of decreased platelets or bleeding - bruising, pinpoint red spots on the skin, black, tarry stools, nosebleeds  signs of decreased red blood cells - unusually weak or tired, fainting spells, lightheadedness  breathing problems  changes in hearing  changes in vision  chest pain  high blood pressure  low blood counts - This drug may decrease the number of [place] blood cells, red blood cells and platelets. You may be at increased risk for infections and bleeding.  nausea and vomiting  pain, swelling, redness or irritation at the injection site  pain, tingling, numbness in the hands or feet  problems with balance, talking, walking  trouble passing urine or change in the amount of urine Side effects that usually do not require medical attention (report to your doctor or health care professional if they continue or are bothersome):  hair loss  loss of appetite  metallic taste in the mouth or changes in taste This list  may not describe all possible side effects. Call your doctor for medical advice about side effects. You may report side effects to FDA at 1-800-FDA-1088. Where should I keep my medicine? This drug is given in a hospital or clinic and will not be stored  at home. NOTE: This sheet is a summary. It may not cover all possible information. If you have questions about this medicine, talk to your doctor, pharmacist, or health care provider.  2020 Elsevier/Gold Standard (2007-05-09 14:38:05)

## 2019-04-12 ENCOUNTER — Other Ambulatory Visit: Payer: Self-pay

## 2019-04-12 ENCOUNTER — Inpatient Hospital Stay (HOSPITAL_BASED_OUTPATIENT_CLINIC_OR_DEPARTMENT_OTHER): Payer: Medicare Other | Admitting: Oncology

## 2019-04-12 ENCOUNTER — Inpatient Hospital Stay (HOSPITAL_BASED_OUTPATIENT_CLINIC_OR_DEPARTMENT_OTHER): Payer: Medicare Other | Admitting: Hospice and Palliative Medicine

## 2019-04-12 ENCOUNTER — Encounter: Payer: Self-pay | Admitting: Oncology

## 2019-04-12 ENCOUNTER — Other Ambulatory Visit: Payer: Self-pay | Admitting: Oncology

## 2019-04-12 ENCOUNTER — Inpatient Hospital Stay: Payer: Medicare Other

## 2019-04-12 DIAGNOSIS — G893 Neoplasm related pain (acute) (chronic): Secondary | ICD-10-CM

## 2019-04-12 DIAGNOSIS — C7802 Secondary malignant neoplasm of left lung: Secondary | ICD-10-CM

## 2019-04-12 DIAGNOSIS — C3492 Malignant neoplasm of unspecified part of left bronchus or lung: Secondary | ICD-10-CM

## 2019-04-12 DIAGNOSIS — Z515 Encounter for palliative care: Secondary | ICD-10-CM

## 2019-04-12 DIAGNOSIS — C220 Liver cell carcinoma: Secondary | ICD-10-CM

## 2019-04-12 MED ORDER — OXYCODONE HCL 5 MG PO TABS: 5.0000 mg | ORAL_TABLET | ORAL | 0 refills | Status: DC | PRN

## 2019-04-12 MED ORDER — FENTANYL 12 MCG/HR TD PT72: 1.0000 | MEDICATED_PATCH | TRANSDERMAL | 0 refills | Status: DC

## 2019-04-12 NOTE — Progress Notes (Signed)
[place]  Telephone:([phone] [phone]  Patient Care Team: Samuel Hood as PCP - General (Physician Assistant) Samuel Nab, RN as Registered Nurse Samuel Jacks, RN as Registered Nurse   Name of the patient: Samuel Hood  [id number]  1950-12-15   Date of visit: 04/12/19  Diagnosis- Lung Cancer/Liver cancer   Chief complaint/Reason for visit- Initial Meeting for [facility], preparing for starting chemotherapy  I connected with Samuel Hood. on 04/12/19 at  9:30 AM EST by telephone visit and verified that I am speaking with the correct person using two identifiers.   I discussed the limitations, risks, security and privacy concerns of performing an evaluation and management service by telemedicine and the availability of in-person appointments. I also discussed with the patient that there may be a patient responsible charge related to this service. The patient expressed understanding and agreed to proceed.   Other persons participating in the visit and their role in the encounter: None  Patient's location: Home Provider's location: Office  Heme/Onc history:  Oncology History Overview Note  Samuel Hood. is a  69 y.o.  male with PMH listed below was seen in consultation at the request of  Pollak, Adriana M, PA-C  for evaluation of lung nodule in the liver lesion. Patient has a past medical history of hypertension, alcohol abuse, smoking emphysema.  He has had serial CTs done in the past.  CT images were independently reviewed by me. 07/26/2017 CT chest with contrast showed extensive pleural-parenchymal scarring within the left upper lobe with several areas of soft tissue nodularity measuring up to 1.6 cm.  In this patient who is at increased risk of lung cancer further investigation with PET scan is recommended. Small chronic appearance loculated hydropneumothorax overlies the left  apex. Slowly enlarging lesion within the central portion of the right lobe of liver identified. Per note, patient supposed to have additional work-up done in December repeat CT scan which he did not   Patient was recently seen by primary care provider and has subsequent image done for follow-up. CT on 06/30/2018 showed multiple significantly enlarged paraspinal mass are noted concerning.  The largest measures 3.8 x 0.5 cm and there appears to be a lytic destruction of the adjacent T11 vertebral body.  Also noted is new solid density measuring 17 x 12 mm in the pleural parenchymal density in the left upper lobe noted on prior exam.  Concerning for malignancy.  4.4 ascending thoracic aortic aneurysm.  Recommend annual imaging.   Patient had PET scan done on 07/06/2018 Which showed there are 2 FDG avid lesion within the left upper lobe worrisome for primary lung neoplasm.  There is evidence of chest wall involvement.  Metastasis to the posterior mediastinum is identified with extension into T11 vertebra and possible involvement of the left T12 neural foramina.   Patient reports left shoulder pain.  Smoking half a pack a day not motivated with further questioning quitting Denies weight loss, hemoptysis, cough.  Chronic shortness of breath with exertion. He drinks alcohol daily. Lives with his girlfriend.  He has 5 adult kids   #Hepatitis C   # 6/1?2020  CT-guided biopsy of left-sided posterior mediastinal soft tissue adjacent to T10/11. Patient's case was discussed on tumor board. Consensus was reached that [facility] versus primary lung cancer with hepatoid adenocarcinoma features. Consensus was to proceed with liver biopsy first as liver mass was not FDG avid on  PET scan.  Patient underwent liver biopsy and the present to discuss pathology reports.   ## 08/08/2018 Liver mass biopsy showed fragments of necrotic and calcified tissue with adjacent fibrous capsule Scant viable nonneoplastic liver tissue with  steatosis, inflammation and non specific fibrosis.  # 08/28/2018 CT guided left upper lobe lung mass biopsy showed poorly differentiated adenocarcinoma of lung origin.  6/19-08/16/2018  Finished palliative radiation to paraspinal soft tissue mass.   Hepatocellular carcinoma metastatic to left lung ([place])  07/24/2018 Initial Diagnosis   Hepatocellular carcinoma metastatic to left lung ([place])   04/16/2019 -  Chemotherapy   The patient had palonosetron (ALOXI) injection 0.25 mg, 0.25 mg, Intravenous,  Once, 0 of 6 cycles pegfilgrastim-jmdb (FULPHILA) injection 6 mg, 6 mg, Subcutaneous,  Once, 0 of 6 cycles CARBOplatin (PARAPLATIN) in sodium chloride 0.9 % 100 mL chemo infusion, , Intravenous,  Once, 0 of 6 cycles fosaprepitant (EMEND) 150 mg in sodium chloride 0.9 % 145 mL IVPB, 150 mg, Intravenous,  Once, 0 of 6 cycles PACLitaxel (TAXOL) 354 mg in sodium chloride 0.9 % 500 mL chemo infusion (> 80mg /m2), 200 mg/m2, Intravenous,  Once, 0 of 6 cycles atezolizumab (TECENTRIQ) 1,200 mg in sodium chloride 0.9 % 250 mL chemo infusion, 1,200 mg, Intravenous, Once, 0 of 10 cycles bevacizumab-bvzr (ZIRABEV) 1,000 mg in sodium chloride 0.9 % 100 mL chemo infusion, 15 mg/kg, Intravenous,  Once, 0 of 10 cycles  for chemotherapy treatment.    Stage IV adenocarcinoma of lung, left ([place])  09/02/2018 Initial Diagnosis   Primary lung adenocarcinoma, left ([place])   04/16/2019 -  Chemotherapy   The patient had palonosetron (ALOXI) injection 0.25 mg, 0.25 mg, Intravenous,  Once, 0 of 6 cycles pegfilgrastim-jmdb (FULPHILA) injection 6 mg, 6 mg, Subcutaneous,  Once, 0 of 6 cycles CARBOplatin (PARAPLATIN) in sodium chloride 0.9 % 100 mL chemo infusion, , Intravenous,  Once, 0 of 6 cycles fosaprepitant (EMEND) 150 mg in sodium chloride 0.9 % 145 mL IVPB, 150 mg, Intravenous,  Once, 0 of 6 cycles PACLitaxel (TAXOL) 354 mg in sodium chloride 0.9 % 500 mL chemo infusion (> 80mg /m2), 200 mg/m2, Intravenous,  Once, 0 of 6  cycles atezolizumab (TECENTRIQ) 1,200 mg in sodium chloride 0.9 % 250 mL chemo infusion, 1,200 mg, Intravenous, Once, 0 of 10 cycles bevacizumab-bvzr (ZIRABEV) 1,000 mg in sodium chloride 0.9 % 100 mL chemo infusion, 15 mg/kg, Intravenous,  Once, 0 of 10 cycles  for chemotherapy treatment.    Cancer, metastatic to bone ([place])  12/16/2018 Initial Diagnosis   Cancer, metastatic to bone ([place])   04/16/2019 -  Chemotherapy   The patient had palonosetron (ALOXI) injection 0.25 mg, 0.25 mg, Intravenous,  Once, 0 of 6 cycles pegfilgrastim-jmdb (FULPHILA) injection 6 mg, 6 mg, Subcutaneous,  Once, 0 of 6 cycles CARBOplatin (PARAPLATIN) in sodium chloride 0.9 % 100 mL chemo infusion, , Intravenous,  Once, 0 of 6 cycles fosaprepitant (EMEND) 150 mg in sodium chloride 0.9 % 145 mL IVPB, 150 mg, Intravenous,  Once, 0 of 6 cycles PACLitaxel (TAXOL) 354 mg in sodium chloride 0.9 % 500 mL chemo infusion (> 80mg /m2), 200 mg/m2, Intravenous,  Once, 0 of 6 cycles atezolizumab (TECENTRIQ) 1,200 mg in sodium chloride 0.9 % 250 mL chemo infusion, 1,200 mg, Intravenous, Once, 0 of 10 cycles bevacizumab-bvzr (ZIRABEV) 1,000 mg in sodium chloride 0.9 % 100 mL chemo infusion, 15 mg/kg, Intravenous,  Once, 0 of 10 cycles  for chemotherapy treatment.      Interval history-  Mr. Perrell is a 69  year old patient who presents to chemo care clinic today for initial meeting in preparation for starting chemotherapy. I introduced the chemo care clinic and we discussed that the role of the clinic is to assist those who are at an increased risk of emergency room visits and/or complications during the course of chemotherapy treatment. We discussed that the increased risk takes into account factors such as age, performance status, and co-morbidities. We also discussed that for some, this might include barriers to care such as not having a primary care provider, lack of insurance/transportation, or not being able to afford medications. We  discussed that the goal of the program is to help prevent unplanned ER visits and help reduce complications during chemotherapy. We do this by discussing specific risk factors to each individual and identifying ways that we can help improve these risk factors and reduce barriers to care.   ECOG FS:1 - Symptomatic but completely ambulatory  Review of systems- Review of Systems  Constitutional: Positive for malaise/fatigue.    Current treatment- carbo/Taxol/Avastin and atezolizumab.   Allergies  Allergen Reactions  . 5-Alpha Reductase Inhibitors     Past Medical History:  Diagnosis Date  . Hepatocellular carcinoma metastatic to left lung ([place]) 07/24/2018  . History of angiography    left lower extremity  . Hypertension   . Small bowel obstruction [facility])     Past Surgical History:  Procedure Laterality Date  . COLON SURGERY    . COLONOSCOPY W/ POLYPECTOMY    . COLONOSCOPY WITH PROPOFOL N/A 01/31/2018   Procedure: COLONOSCOPY WITH PROPOFOL;  Surgeon: Toledo, Benay Pike, MD;  Location: [facility] ENDOSCOPY;  Service: Gastroenterology;  Laterality: N/A;  . debridement fasciotomy leg, left Left 03/19/2016  . LAPAROTOMY N/A 09/27/2014   Procedure: EXPLORATORY LAPAROTOMY;  Surgeon: Dia Crawford III, MD;  Location: [facility] ORS;  Service: General;  Laterality: N/A;  . PORTA CATH INSERTION N/A 04/10/2019   Procedure: PORTA CATH INSERTION;  Surgeon: Katha Cabal, MD;  Location: [place] CV LAB;  Service: Cardiovascular;  Laterality: N/A;    Social History   Socioeconomic History  . Marital status: Single    Spouse name: Not on file  . Number of children: Not on file  . Years of education: Not on file  . Highest education level: Not on file  Occupational History  . Occupation: retired  Tobacco Use  . Smoking status: Former Smoker    Packs/day: 0.25    Years: 50.00    Pack years: 12.50  . Smokeless tobacco: Never Used  . Tobacco comment: 1-2/week  Substance and Sexual Activity  .  Alcohol use: Not Currently    Alcohol/week: 35.0 standard drinks    Types: 30 Cans of beer, 5 Shots of liquor per week  . Drug use: No  . Sexual activity: Not on file  Other Topics Concern  . Not on file  Social History Narrative  . Not on file   Social Determinants of Health   Financial Resource Strain: High Risk  . Difficulty of Paying Living Expenses: Hard  Food Insecurity: No Food Insecurity  . Worried About Charity fundraiser in the Last Year: Never true  . Ran Out of Food in the Last Year: Never true  Transportation Needs: No Transportation Needs  . Lack of Transportation (Medical): No  . Lack of Transportation (Non-Medical): No  Physical Activity: Sufficiently Active  . Days of Exercise per Week: 7 days  . Minutes of Exercise per Session: 30 min  Stress:  No Stress Concern Present  . Feeling of Stress : Not at all  Social Connections: Unknown  . Frequency of Communication with Friends and Family: More than three times a week  . Frequency of Social Gatherings with Friends and Family: More than three times a week  . Attends Religious Services: More than 4 times per year  . Active Member of Clubs or Organizations: Not on file  . Attends Archivist Meetings: Not on file  . Marital Status: Not on file  Intimate Partner Violence: Unknown  . Fear of Current or Ex-Partner: Patient refused  . Emotionally Abused: No  . Physically Abused: No  . Sexually Abused: No    Family History  Problem Relation Age of Onset  . Cancer Mother   . Cancer Father      Current Outpatient Medications:  .  amLODipine (NORVASC) 5 MG tablet, Take 1 tablet (5 mg total) by mouth daily., Disp: 30 tablet, Rfl: 0 .  dexamethasone (DECADRON) 4 MG tablet, Take 2 tablets (8 mg total) by mouth daily. Start the day after carboplatin chemotherapy for 3 days. (Patient taking differently: Take 8 mg by mouth See admin instructions. Take 8 mg daily starting the day after carboplatin chemotherapy for  3 days.), Disp: 30 tablet, Rfl: 1 .  fentaNYL (DURAGESIC) 12 MCG/HR, Place 1 patch onto the skin every 3 (three) days., Disp: 10 patch, Rfl: 0 .  lidocaine-prilocaine (EMLA) cream, Apply to affected area once (Patient taking differently: Apply 1 application topically daily as needed (port acess). ), Disp: 30 g, Rfl: 3 .  loperamide (IMODIUM) 2 MG capsule, Take 1 capsule (2 mg total) by mouth See admin instructions. With onset of loose stool, take 4mg  followed by 2mg  every 2 hours until 12 hours have passed without loose bowel movement. Maximum: 16 mg/day (Patient not taking: Reported on 04/09/2019), Disp: 60 capsule, Rfl: 1 .  Multiple Vitamin (MULTIVITAMIN) capsule, Take 1 capsule by mouth daily., Disp: , Rfl:  .  ondansetron (ZOFRAN) 8 MG tablet, Take 1 tablet (8 mg total) by mouth 2 (two) times daily as needed for refractory nausea / vomiting. Start on day 3 after carboplatin chemo., Disp: 30 tablet, Rfl: 1 .  oxyCODONE (ROXICODONE) 5 MG immediate release tablet, Take 1 tablet (5 mg total) by mouth every 4 (four) hours as needed for severe pain or breakthrough pain., Disp: 60 tablet, Rfl: 0 .  polyethylene glycol (MIRALAX) 17 g packet, Take 17 g by mouth daily. (Patient not taking: Reported on 04/09/2019), Disp: 21 each, Rfl: 0 .  prochlorperazine (COMPAZINE) 10 MG tablet, Take 1 tablet (10 mg total) by mouth every 6 (six) hours as needed (Nausea or vomiting)., Disp: 30 tablet, Rfl: 1 .  senna (SENOKOT) 8.6 MG TABS tablet, Take 1 tablet (8.6 mg total) by mouth daily. (Patient taking differently: Take 1 tablet by mouth daily as needed for mild constipation. ), Disp: 120 tablet, Rfl: 0  Physical exam: There were no vitals filed for this visit. Limited d/t virtual platform  CMP Latest Ref Rng & Units 04/06/2019  Glucose 70 - 99 mg/dL 87  BUN 8 - 23 mg/dL 8  Creatinine 0.61 - 1.24 mg/dL 0.71  Sodium 135 - 145 mmol/L 138  Potassium 3.5 - 5.1 mmol/L 3.8  Chloride 98 - 111 mmol/L 100  CO2 22 - 32  mmol/L 27  Calcium 8.9 - 10.3 mg/dL 9.3  Total Protein 6.5 - 8.1 g/dL 8.1  Total Bilirubin 0.3 - 1.2 mg/dL 0.6  Alkaline Phos 38 - 126 U/L 78  AST 15 - 41 U/L 36  ALT 0 - 44 U/L 40   CBC Latest Ref Rng & Units 04/06/2019  WBC 4.0 - 10.5 K/uL 4.8  Hemoglobin 13.0 - 17.0 g/dL 16.7  Hematocrit 39.0 - 52.0 % 49.5  Platelets 150 - 400 K/uL 327    No images are attached to the encounter.  CT Chest W Contrast  Result Date: 03/26/2019 CLINICAL DATA:  Restaging left lung cancer. Hepatocellular carcinoma restaging. EXAM: CT CHEST, ABDOMEN, AND PELVIS WITH CONTRAST TECHNIQUE: Multidetector CT imaging of the chest, abdomen and pelvis was performed following the standard protocol during bolus administration of intravenous contrast. CONTRAST:  152mL OMNIPAQUE IOHEXOL 300 MG/ML  SOLN COMPARISON:  03/24/2019 and 11/29/2018 FINDINGS: CT CHEST FINDINGS Cardiovascular: Stable ascending thoracic aortic aneurysm, 4.2 cm in diameter. Coronary, aortic arch, and branch vessel atherosclerotic vascular disease. Prominence of the azygos vein. Mediastinum/Nodes: No pathologic adenopathy identified. Lungs/Pleura: Prominent emphysema. Bilateral airway thickening. Architectural distortion, scarring, and some chronic nodularity in the left upper lobe, much of which is likely therapy related, and not appreciably changed. Musculoskeletal: Stable mild left paraspinal density at the T11 level with residual erosion along the left anterior T11 vertebral body. The soft tissue density adjacent to T11 is difficult to completely separate from the adjacent azygos vein but measures about 1.6 by 1.5 cm on image 48/2, and minimally are erodes the adjacent left lateral cortex of T11. This is roughly stable from 03/24/2019 exam. CT ABDOMEN PELVIS FINDINGS Hepatobiliary: Sharply defined segment 8 mass with mild internal heterogeneity measures 3.1 by 2.9 cm, previously 3.2 by 2.9 cm. On the prior exam was query whether this lesion occluding part of  the hepatic vein, certainly the lesion is closely associated with the right hepatic vein although definite occlusion is not observed. Adjacent rim enhancing lesion in segment 81.5 by 1.5 cm, stable. A segment 6 lesion with central washout and peripheral enhancement measures about 2.2 by 2.4 cm on image 65/2, stable. Ill-defined hypodense lesion posteriorly in the right hepatic lobe approximately 0.7 cm in diameter on image 71/2. Overall the hepatic lesions are unchanged. 1.3 cm gallstone in the gallbladder. The gallbladder is mildly contracted. No biliary dilatation. Pancreas: Unremarkable Spleen: Unremarkable Adrenals/Urinary Tract: Both adrenal glands appear normal. Stable right renal hypodense lesions, likely cysts. Stomach/Bowel: Swirling of the small bowel mesentery in the central pelvis but without dilated bowel to suggest a significant volvulus. Stable postoperative findings along proximal sigmoid colon margin. Vascular/Lymphatic: Aortoiliac atherosclerotic vascular disease. No pathologic adenopathy identified. Reproductive: Speckled calcifications along the tunica albuginea the penis. Prostate gland unchanged. Other: No supplemental non-categorized findings. Musculoskeletal: Bridging spurring of both sacroiliac joints. Grade 1 anterolisthesis at L5-S1. Lumbar spondylosis and degenerative disc disease causing multilevel impingement most striking at L5-S1. IMPRESSION: 1. Essentially stable appearance of the hepatic metastatic lesions, not unexpected given that prior exam was from 2 days ago. The left paraspinal soft tissue density at T11 is likewise stable. 2. Stable architectural distortion, scarring, and chronic nodularity in the left upper lobe, much of which is likely therapy related. 3. Swirling of the small bowel mesenteric vessels in the pelvis, but no dilated bowel to suggest a significant volvulus. 4. Other imaging findings of potential clinical significance: Airway thickening is present, suggesting  bronchitis or reactive airways disease. Stable ascending thoracic aortic aneurysm, 4.2 cm in diameter. Cholelithiasis. Lumbar spondylosis and degenerative disc disease causing multilevel impingement most striking at L5-S1. Bridging spurring of both sacroiliac joints.  Aortic Atherosclerosis (ICD10-I70.0) and Emphysema (ICD10-J43.9). Electronically Signed   By: Van Clines M.D.   On: 03/26/2019 10:58   CT Angio Chest PE W and/or Wo Contrast  Result Date: 03/24/2019 CLINICAL DATA:  68 year old male with chest pain. History of hepatocellular carcinoma/hepatocellular adenocarcinoma with a positive biopsy in the posterior mediastinum. Subsequent biopsy of liver mass was negative. EXAM: CT ANGIOGRAPHY CHEST CT ABDOMEN AND PELVIS WITH CONTRAST TECHNIQUE: Multidetector CT imaging of the chest was performed using the standard protocol during bolus administration of intravenous contrast. Multiplanar CT image reconstructions and MIPs were obtained to evaluate the vascular anatomy. Multidetector CT imaging of the abdomen and pelvis was performed using the standard protocol during bolus administration of intravenous contrast. CONTRAST:  26mL OMNIPAQUE IOHEXOL 350 MG/ML SOLN COMPARISON:  11/29/2018, CT 06/18/2014, 07/26/2017, 06/30/2018 FINDINGS: CTA CHEST FINDINGS Cardiovascular: Heart: Heart size unchanged. No pericardial fluid/thickening. Calcifications of the left main, left anterior descending, circumflex, right coronary arteries. Aorta: Greatest diameter of the ascending aorta estimated 42 mm which is unchanged from the comparison. Opacification of the aorta it is limited given the timing of the contrast bolus for the pulmonary arteries. Common origin of the innominate artery an the left common carotid artery. Mild atherosclerosis of the aortic arch. Pulmonary arteries: No central, lobar, segmental, or proximal subsegmental filling defects. Pulmonary arteries are attenuated within the left upper lobe secondary to  treatment effect. Mediastinum/Nodes: No mediastinal adenopathy. Unremarkable appearance of the thoracic esophagus. Nodularity of the posterior mediastinum is significantly decreased as compared to the pre treatment CT of 06/30/2018. Unremarkable thoracic inlet. Lungs/Pleura: Advanced paraseptal and centrilobular emphysema, including bullous changes at the bilateral lung apices. Redemonstration of treatment changes of the left upper lung, with architectural distortion, scarring, and pleural thickening persisting. Subpleural reticulation at the right lower lobe lung base. No confluent airspace disease. No pleural effusion. Review of the MIP images confirms the above findings. CT ABDOMEN and PELVIS FINDINGS Hepatobiliary: Redemonstration of the heterogeneous mass within the right liver, segment 8, with well-defined border and a diameter of 2.7 cm, unchanged. Compared to prior CT there is a new satellite nodule extending inferior and lateral to this lesion on image 15 of series 4. Second hepatic lesion measuring 2.1 cm within segment 6 on image 22 of series 4, new. Third a [place] lesion segment 6 at the tip of the liver, image 27 of series 4 Additionally there is a heterogeneously hypoattenuating/enhancing region of the left liver with what appears to be occlusion of the left hepatic vein (image 14 of series 4. Hyper dense material in the dependent aspect of the gallbladder, without inflammatory changes. Pancreas: Unremarkable Spleen: Unremarkable Adrenals/Urinary Tract: - Right adrenal gland:  Unremarkable - Left adrenal gland: Unremarkable. - Right kidney: No hydronephrosis, nephrolithiasis, inflammation, or ureteral dilation. Rounded lesions on the lateral cortex of the right kidney, most likely benign, unchanged - Left Kidney: No hydronephrosis, nephrolithiasis, inflammation, or ureteral dilation. No focal lesion. - Urinary Bladder: Unremarkable. Stomach/Bowel: - Stomach: Hiatal hernia with otherwise unremarkable  stomach - Small bowel: Unremarkable - Appendix: Normal - Colon: Mild stool burden.  No focal inflammatory changes. Vascular/Lymphatic: Atherosclerotic calcifications of the abdominal aorta and bilateral iliac arteries. Proximal femoral arteries remain patent. No pelvic or abdominal adenopathy. Reproductive: Transverse diameter of the prostate measures 41 mm Other: Surgical changes along the midline abdomen with laxity in the midline. Small bowel loops are immediately subjacent to the midline laparotomy site Musculoskeletal: Degenerative changes of the thoracolumbar spine. Vacuum disc phenomenon spans all levels  of the lumbar spine. Lytic lesion of T11 with sclerotic border. While this is unchanged in size from the most recent comparison CT, it is new from 2019 and in the region of the pathologically proven tumor of the posterior mediastinum. Review of the MIP images confirms the above findings. IMPRESSION: CT negative for pulmonary emboli, with no acute finding to account for chest pain. Progression of hepatic malignancy, with at least 3 new hepatic lesions, separate from the previously biopsied lesion of the right liver. In addition there is questionable new infiltrative disease of the left liver which appears to occlude the left hepatic vein. Referral for follow-up with oncology is indicated, as well as consideration of contrast-enhanced liver MRI. Treatment changes of the left upper lobe without evidence of local recurrence/progression. In addition, the T11 malignant lytic lesion and biopsy proven tumor of left posterior mediastinal/paravertebral T11 level are unchanged. Advanced emphysema.  Emphysema (ICD10-J43.9). Aortic atherosclerosis and iliac arterial disease. Aortic Atherosclerosis (ICD10-I70.0). Unchanged diameter of ascending aorta, 4.2 cm. Recommend annual imaging followup by CTA or MRA. This recommendation follows 2010 ACCF/AHA/AATS/ACR/ASA/SCA/SCAI/SIR/STS/SVM Guidelines for the Diagnosis and  Management of Patients with Thoracic Aortic Disease. Circulation. 2010; 121: [id number]. Aortic aneurysm NOS (ICD10-I71.9) Electronically Signed   By: Corrie Mckusick D.O.   On: 03/24/2019 08:53   CT Abdomen Pelvis W Contrast  Result Date: 03/26/2019 CLINICAL DATA:  Restaging left lung cancer. Hepatocellular carcinoma restaging. EXAM: CT CHEST, ABDOMEN, AND PELVIS WITH CONTRAST TECHNIQUE: Multidetector CT imaging of the chest, abdomen and pelvis was performed following the standard protocol during bolus administration of intravenous contrast. CONTRAST:  197mL OMNIPAQUE IOHEXOL 300 MG/ML  SOLN COMPARISON:  03/24/2019 and 11/29/2018 FINDINGS: CT CHEST FINDINGS Cardiovascular: Stable ascending thoracic aortic aneurysm, 4.2 cm in diameter. Coronary, aortic arch, and branch vessel atherosclerotic vascular disease. Prominence of the azygos vein. Mediastinum/Nodes: No pathologic adenopathy identified. Lungs/Pleura: Prominent emphysema. Bilateral airway thickening. Architectural distortion, scarring, and some chronic nodularity in the left upper lobe, much of which is likely therapy related, and not appreciably changed. Musculoskeletal: Stable mild left paraspinal density at the T11 level with residual erosion along the left anterior T11 vertebral body. The soft tissue density adjacent to T11 is difficult to completely separate from the adjacent azygos vein but measures about 1.6 by 1.5 cm on image 48/2, and minimally are erodes the adjacent left lateral cortex of T11. This is roughly stable from 03/24/2019 exam. CT ABDOMEN PELVIS FINDINGS Hepatobiliary: Sharply defined segment 8 mass with mild internal heterogeneity measures 3.1 by 2.9 cm, previously 3.2 by 2.9 cm. On the prior exam was query whether this lesion occluding part of the hepatic vein, certainly the lesion is closely associated with the right hepatic vein although definite occlusion is not observed. Adjacent rim enhancing lesion in segment 81.5 by 1.5 cm,  stable. A segment 6 lesion with central washout and peripheral enhancement measures about 2.2 by 2.4 cm on image 65/2, stable. Ill-defined hypodense lesion posteriorly in the right hepatic lobe approximately 0.7 cm in diameter on image 71/2. Overall the hepatic lesions are unchanged. 1.3 cm gallstone in the gallbladder. The gallbladder is mildly contracted. No biliary dilatation. Pancreas: Unremarkable Spleen: Unremarkable Adrenals/Urinary Tract: Both adrenal glands appear normal. Stable right renal hypodense lesions, likely cysts. Stomach/Bowel: Swirling of the small bowel mesentery in the central pelvis but without dilated bowel to suggest a significant volvulus. Stable postoperative findings along proximal sigmoid colon margin. Vascular/Lymphatic: Aortoiliac atherosclerotic vascular disease. No pathologic adenopathy identified. Reproductive: Speckled calcifications along the tunica albuginea  the penis. Prostate gland unchanged. Other: No supplemental non-categorized findings. Musculoskeletal: Bridging spurring of both sacroiliac joints. Grade 1 anterolisthesis at L5-S1. Lumbar spondylosis and degenerative disc disease causing multilevel impingement most striking at L5-S1. IMPRESSION: 1. Essentially stable appearance of the hepatic metastatic lesions, not unexpected given that prior exam was from 2 days ago. The left paraspinal soft tissue density at T11 is likewise stable. 2. Stable architectural distortion, scarring, and chronic nodularity in the left upper lobe, much of which is likely therapy related. 3. Swirling of the small bowel mesenteric vessels in the pelvis, but no dilated bowel to suggest a significant volvulus. 4. Other imaging findings of potential clinical significance: Airway thickening is present, suggesting bronchitis or reactive airways disease. Stable ascending thoracic aortic aneurysm, 4.2 cm in diameter. Cholelithiasis. Lumbar spondylosis and degenerative disc disease causing multilevel  impingement most striking at L5-S1. Bridging spurring of both sacroiliac joints. Aortic Atherosclerosis (ICD10-I70.0) and Emphysema (ICD10-J43.9). Electronically Signed   By: Van Clines M.D.   On: 03/26/2019 10:58   CT ABDOMEN PELVIS W CONTRAST  Result Date: 03/24/2019 CLINICAL DATA:  69 year old male with chest pain. History of hepatocellular carcinoma/hepatocellular adenocarcinoma with a positive biopsy in the posterior mediastinum. Subsequent biopsy of liver mass was negative. EXAM: CT ANGIOGRAPHY CHEST CT ABDOMEN AND PELVIS WITH CONTRAST TECHNIQUE: Multidetector CT imaging of the chest was performed using the standard protocol during bolus administration of intravenous contrast. Multiplanar CT image reconstructions and MIPs were obtained to evaluate the vascular anatomy. Multidetector CT imaging of the abdomen and pelvis was performed using the standard protocol during bolus administration of intravenous contrast. CONTRAST:  46mL OMNIPAQUE IOHEXOL 350 MG/ML SOLN COMPARISON:  11/29/2018, CT 06/18/2014, 07/26/2017, 06/30/2018 FINDINGS: CTA CHEST FINDINGS Cardiovascular: Heart: Heart size unchanged. No pericardial fluid/thickening. Calcifications of the left main, left anterior descending, circumflex, right coronary arteries. Aorta: Greatest diameter of the ascending aorta estimated 42 mm which is unchanged from the comparison. Opacification of the aorta it is limited given the timing of the contrast bolus for the pulmonary arteries. Common origin of the innominate artery an the left common carotid artery. Mild atherosclerosis of the aortic arch. Pulmonary arteries: No central, lobar, segmental, or proximal subsegmental filling defects. Pulmonary arteries are attenuated within the left upper lobe secondary to treatment effect. Mediastinum/Nodes: No mediastinal adenopathy. Unremarkable appearance of the thoracic esophagus. Nodularity of the posterior mediastinum is significantly decreased as compared to  the pre treatment CT of 06/30/2018. Unremarkable thoracic inlet. Lungs/Pleura: Advanced paraseptal and centrilobular emphysema, including bullous changes at the bilateral lung apices. Redemonstration of treatment changes of the left upper lung, with architectural distortion, scarring, and pleural thickening persisting. Subpleural reticulation at the right lower lobe lung base. No confluent airspace disease. No pleural effusion. Review of the MIP images confirms the above findings. CT ABDOMEN and PELVIS FINDINGS Hepatobiliary: Redemonstration of the heterogeneous mass within the right liver, segment 8, with well-defined border and a diameter of 2.7 cm, unchanged. Compared to prior CT there is a new satellite nodule extending inferior and lateral to this lesion on image 15 of series 4. Second hepatic lesion measuring 2.1 cm within segment 6 on image 22 of series 4, new. Third a Paddock lesion segment 6 at the tip of the liver, image 27 of series 4 Additionally there is a heterogeneously hypoattenuating/enhancing region of the left liver with what appears to be occlusion of the left hepatic vein (image 14 of series 4. Hyper dense material in the dependent aspect of the gallbladder, without inflammatory  changes. Pancreas: Unremarkable Spleen: Unremarkable Adrenals/Urinary Tract: - Right adrenal gland:  Unremarkable - Left adrenal gland: Unremarkable. - Right kidney: No hydronephrosis, nephrolithiasis, inflammation, or ureteral dilation. Rounded lesions on the lateral cortex of the right kidney, most likely benign, unchanged - Left Kidney: No hydronephrosis, nephrolithiasis, inflammation, or ureteral dilation. No focal lesion. - Urinary Bladder: Unremarkable. Stomach/Bowel: - Stomach: Hiatal hernia with otherwise unremarkable stomach - Small bowel: Unremarkable - Appendix: Normal - Colon: Mild stool burden.  No focal inflammatory changes. Vascular/Lymphatic: Atherosclerotic calcifications of the abdominal aorta and  bilateral iliac arteries. Proximal femoral arteries remain patent. No pelvic or abdominal adenopathy. Reproductive: Transverse diameter of the prostate measures 41 mm Other: Surgical changes along the midline abdomen with laxity in the midline. Small bowel loops are immediately subjacent to the midline laparotomy site Musculoskeletal: Degenerative changes of the thoracolumbar spine. Vacuum disc phenomenon spans all levels of the lumbar spine. Lytic lesion of T11 with sclerotic border. While this is unchanged in size from the most recent comparison CT, it is new from 2019 and in the region of the pathologically proven tumor of the posterior mediastinum. Review of the MIP images confirms the above findings. IMPRESSION: CT negative for pulmonary emboli, with no acute finding to account for chest pain. Progression of hepatic malignancy, with at least 3 new hepatic lesions, separate from the previously biopsied lesion of the right liver. In addition there is questionable new infiltrative disease of the left liver which appears to occlude the left hepatic vein. Referral for follow-up with oncology is indicated, as well as consideration of contrast-enhanced liver MRI. Treatment changes of the left upper lobe without evidence of local recurrence/progression. In addition, the T11 malignant lytic lesion and biopsy proven tumor of left posterior mediastinal/paravertebral T11 level are unchanged. Advanced emphysema.  Emphysema (ICD10-J43.9). Aortic atherosclerosis and iliac arterial disease. Aortic Atherosclerosis (ICD10-I70.0). Unchanged diameter of ascending aorta, 4.2 cm. Recommend annual imaging followup by CTA or MRA. This recommendation follows 2010 ACCF/AHA/AATS/ACR/ASA/SCA/SCAI/SIR/STS/SVM Guidelines for the Diagnosis and Management of Patients with Thoracic Aortic Disease. Circulation. 2010; 121: P102-H852. Aortic aneurysm NOS (ICD10-I71.9) Electronically Signed   By: Corrie Mckusick D.O.   On: 03/24/2019 08:53    PERIPHERAL VASCULAR CATHETERIZATION  Result Date: 04/10/2019 See Op Note  US BIOPSY (LIVER)  Result Date: 03/30/2019 INDICATION: History of lung cancer and hepatocellular carcinoma. New right inferior hepatic lesion EXAM: ULTRASOUND GUIDED CORE BIOPSY OF NEW RIGHT INFERIOR HEPATIC LESION MEDICATIONS: 1% LIDOCAINE LOCAL ANESTHESIA/SEDATION: Fentanyl 100 mcg IV; Versed 2.0 mg IV Moderate Sedation Time:  13 MINUTES The patient was continuously monitored during the procedure by the interventional radiology nurse under my direct supervision. PROCEDURE: The procedure, risks, benefits, and alternatives were explained to the patient. Questions regarding the procedure were encouraged and answered. The patient understands and consents to the procedure. The right anterior subcostal area was prepped with ChloraPrep in a sterile fashion, and a sterile drape was applied covering the operative field. A sterile gown and sterile gloves were used for the procedure. Local anesthesia was provided with 1% Lidocaine. previous imaging reviewed. Preliminary ultrasound performed of the right hepatic lobe from a right upper quadrant subcostal approach. The right inferior hepatic lesion was localized and marked. Under sterile conditions and local anesthesia, a 17 gauge coaxial guide was advanced to the lesion. Images obtained for documentation. 2 18 gauge core biopsies obtained. Samples were intact and non fragmented. These were placed in formalin. Needle removed. Needle tract occluded with Gel-Foam. Postprocedure imaging demonstrates no hemorrhage or hematoma.  Patient tolerated biopsy well. COMPLICATIONS: None immediate. FINDINGS: Imaging confirms needle placement in the right inferior hepatic lesion for core biopsy IMPRESSION: Successful ultrasound right inferior hepatic lesion 18 gauge core biopsies Electronically Signed   By: Jerilynn Mages.  Shick M.D.   On: 03/30/2019 10:34   DG Chest Port 1 View  Result Date: 03/24/2019 CLINICAL DATA:   Acute chest pain. Patient with LEFT lung cancer. EXAM: PORTABLE CHEST 1 VIEW COMPARISON:  08/01/2017 FINDINGS: Cardiomediastinal silhouette is unchanged. Scarring within the UPPER LEFT lung again noted. Emphysema again identified. There is no evidence of focal airspace disease, pulmonary edema, suspicious pulmonary nodule/mass, pleural effusion, or pneumothorax. No acute bony abnormalities are identified. IMPRESSION: No evidence of acute cardiopulmonary disease. Electronically Signed   By: Margarette Canada M.D.   On: 03/24/2019 07:39   Assessment and plan- Patient is a 69 y.o. male who presents to [facility] for initial meeting in preparation for starting chemotherapy for the treatment of lung cancer.    1. HPI: Patient is a 69 year old male who has history of adenocarcinoma to left upper lobe status post SBRT.  He is also status post radiation to a paraspinal mass from known hepatocellular carcinoma (positive for hepatitis C).  He has been stable on lenvatinib 12 mg daily.  He had been doing well until he was recently evaluated in the emergency room for chest pain.  Work-up included CT of abdomen which showed new liver lesions.  Biopsy of liver lesion occurred on 03/30/2019 which revealed adenocarcinoma consistent with lung.  He had a port placed by Dr. Delana Meyer and will begin chemotherapy with carbo/Taxol/Avastin and atezolizumab.  He will discontinue lenvatinib.  2. Chemo Care Clinic/High Risk for ER/Hospitalization during chemotherapy- We discussed the role of the chemo care clinic and identified patient specific risk factors. I discussed that patient was identified as high risk primarily based on:co-morbidities and previous ed admissions.   Patient has past medical history positive for: Past Medical History:  Diagnosis Date  . Hepatocellular carcinoma metastatic to left lung ([place]) 07/24/2018  . History of angiography    left lower extremity  . Hypertension   . Small bowel obstruction ([place])      Patient has past surgical history positive for: Past Surgical History:  Procedure Laterality Date  . COLON SURGERY    . COLONOSCOPY W/ POLYPECTOMY    . COLONOSCOPY WITH PROPOFOL N/A 01/31/2018   Procedure: COLONOSCOPY WITH PROPOFOL;  Surgeon: Toledo, Benay Pike, MD;  Location: [facility] ENDOSCOPY;  Service: Gastroenterology;  Laterality: N/A;  . debridement fasciotomy leg, left Left 03/19/2016  . LAPAROTOMY N/A 09/27/2014   Procedure: EXPLORATORY LAPAROTOMY;  Surgeon: Dia Crawford III, MD;  Location: [facility] ORS;  Service: General;  Laterality: N/A;  . PORTA CATH INSERTION N/A 04/10/2019   Procedure: PORTA CATH INSERTION;  Surgeon: Katha Cabal, MD;  Location: [facility];  Service: Cardiovascular;  Laterality: N/A;    Based on our high risk symptom management report; this patient has a high risk of ED utilization.  The percentage below indicates how "at risk "  this patient based on the factors in this table within one year.   General Risk Score: 7  Values used to calculate this score:   Points  Metrics      1        Age: 37      1        Hospital Admissions: 1      3  ED Visits: 4      1        Has Chronic Obstructive Pulmonary Disease: Yes      0        Has Diabetes: No      0        Has Congestive Heart Failure: No      1        Has liver disease: Yes      0        Has Depression: No      0        Current PCP: Trinna Post, PA-C      0        Has Medicaid: No   3. We discussed that social determinants of health may have significant impacts on health and outcomes for cancer patients.  Today we discussed specific social determinants of performance status, alcohol use, depression, financial needs, food insecurity, housing, interpersonal violence, social connections, stress, tobacco use, and transportation.    After lengthy discussion the following were identified as areas of need: None at this time  Outpatient services: We discussed options including home based  and outpatient services, DME and care program. We discusssed that patients who participate in regular physical activity report fewer negative impacts of cancer and treatments and report less fatigue.   Financial Concerns: We discussed that living with cancer can create tremendous financial burden.  We discussed options for assistance. I asked that if assistance is needed in affording medications or paying bills to please let us know so that we can provide assistance. We discussed options for food including social services, [employer] ($50 every 2 weeks) and onsite food pantry.  We will also notify [facility] to see if cancer center can provide additional support.  Referral to Social work: Introduced Education officer, museum Elease Etienne and the services he can provide such as support with [employer], cell phone and gas vouchers.   Support groups: We discussed options for support groups at the cancer center. If interested, please notify nurse navigator to enroll. We discussed options for managing stress including healthy eating, exercise as well as participating in no charge counseling services at the cancer center and support groups.  If these are of interest, patient can notify either myself or primary nursing team.We discussed options for management including medications and referral to quit Smart program  Transportation: We discussed options for transportation including acta, paratransit, bus routes, link transit, taxi/uber/lyft, and cancer center [place].  I have notified primary oncology team who will help assist with arranging Lucianne Lei transportation for appointments when/if needed. We also discussed options for transportation on short notice/acute visits.  Palliative care services: We have palliative care services available in the cancer center to discuss goals of care and advanced care planning.  Please let us know if you have any questions or would like to speak to our palliative nurse  practitioner.  Symptom Management Clinic: We discussed our symptom management clinic which is available for acute concerns while receiving treatment such as nausea, vomiting or diarrhea.  We can be reached via telephone at [phone] or through my chart.  We are available for virtual or in person visits on the same day from 830 to 4 PM Monday through Friday. He denies needing specific assistance at this time and He will be followed by Dr. Collie Siad clinical team.  Plan: Discussed symptom management clinic. Discussed palliative care services. Patient has palliative care consult today for pain management.  Discussed resources that are available here at the cancer center. Discussed medications and new prescriptions to begin treatment such as anti-nausea or steroids.   Disposition:  RTC on 04/16/19 for labs, assessment and cycle 1.  RTC on 04/13/19 for Bone Scan.   Visit Diagnosis 1. Hepatocellular carcinoma metastatic to left lung ([place])   2. Primary lung adenocarcinoma, left [facility])     Patient expressed understanding and was in agreement with this plan. He also understands that He can call clinic at any time with any questions, concerns, or complaints.   I provided 15 minutes of non face-to-face telephone visit time during this encounter, and > 50% was spent counseling as documented under my assessment & plan.   Anthony at [place]  CC:

## 2019-04-12 NOTE — Progress Notes (Signed)
Virtual Visit via Telephone Note  I connected with Samuel Hood on 04/12/19 at  1:00 PM EST by telephone and verified that I am speaking with the correct person using two identifiers.   I discussed the limitations, risks, security and privacy concerns of performing an evaluation and management service by telephone and the availability of in person appointments. I also discussed with the patient that there may be a patient responsible charge related to this service. The patient expressed understanding and agreed to proceed.   History of Present Illness: [name] is a 69 year old man with multiple medical problems including stage IV adenocarcinoma of the lung (diagnosed 03/30/2019) metastatic to liver and bone and chest wall, also with stage IV HCC (diagnosed 07/17/2018).  PMH also notable for HCV, history of alcohol use/tobacco abuse.  Palliative care was consulted to help address goals and manage ongoing symptoms.  Patient is a widower after having been married 27 years.  He lives at home with a girlfriend.  He has 3 daughters and a son.  His son is currently incarcerated and he reports having a strained relationship with a daughter due to substance abuse.  His daughter, Samuel Hood, is most involved.  Patient previously worked in [employer] (medical).   Observations/Objective: I called and spoke with patient and his daughter.  Patient was seen by me in July 2020 but I reintroduced palliative care services.  Daughter stated that patient had some initial reservations to palliative care and she thought this conversation would likely discuss end-of-life issues or be associated with transition to hospice.  I explained that I was consulted primarily to evaluate and manage his pain, improve his quality of life, and ensure that his goals are translated into the treatment plan.  With patient and daughter confirmed that their goals at this point are to continue chemotherapy/immunotherapy.  They recognize that his  cancer is incurable but would like to prolong life if possible.  Symptomatically, patient has been having a significant amount of chest wall pain associated with his known lytic lesion.  He has been taking oxycodone about every 6 hours, which she describes as being effective but short-lived.  We discussed option of increasing the oxycodone to every 4 hours as needed for breakthrough pain.  Patient is also being started on transdermal fentanyl for long-acting control.  Patient has [place] POA/living will on file.  If it from completing a MOST form if he is interested at time of next clinic visit.  Assessment and Plan: Stage IV lung/HCC -pending treatment with chemo/immunotherapy.  Followed by Dr. Tasia Catchings.  Patient is pending bone scan on 2/26.  He was on have treatment with carbo/Taxol/Tecentriq/Zirabev on 3/1.  Neoplasm related pain -increase oxycodone to 5 mg every 4 hours as needed for breakthrough pain.  Start transdermal fentanyl 12-1/2 mcg every 72 hours.  ACP -on file.  Will benefit from completion of a MOST form.  Follow Up Instructions: RTC in 1 to 2 weeks   I discussed the assessment and treatment plan with the patient. The patient was provided an opportunity to ask questions and all were answered. The patient agreed with the plan and demonstrated an understanding of the instructions.   The patient was advised to call back or seek an in-person evaluation if the symptoms worsen or if the condition fails to improve as anticipated.  I provided 15 minutes of non-face-to-face time during this encounter.   Irean Hong, NP

## 2019-04-13 ENCOUNTER — Encounter
Admission: RE | Admit: 2019-04-13 | Discharge: 2019-04-13 | Disposition: A | Discharge status: Home / Self Care | Payer: Medicare Other | Source: Ambulatory Visit | Attending: Oncology | Admitting: Oncology

## 2019-04-13 ENCOUNTER — Encounter: Payer: Self-pay | Admitting: Oncology

## 2019-04-13 DIAGNOSIS — M899 Disorder of bone, unspecified: Secondary | ICD-10-CM | POA: Insufficient documentation

## 2019-04-13 DIAGNOSIS — C3492 Malignant neoplasm of unspecified part of left bronchus or lung: Secondary | ICD-10-CM | POA: Insufficient documentation

## 2019-04-13 DIAGNOSIS — C3490 Malignant neoplasm of unspecified part of unspecified bronchus or lung: Secondary | ICD-10-CM | POA: Diagnosis not present

## 2019-04-13 MED ORDER — TECHNETIUM TC 99M MEDRONATE IV KIT
22.9900 | PACK | Freq: Once | INTRAVENOUS | Status: AC | PRN
  Administered 2019-04-13: 22.99 via INTRAVENOUS

## 2019-04-13 NOTE — Progress Notes (Signed)
Patient contacted for follow up. Complains of pain to shoulder due to port placement.

## 2019-04-16 ENCOUNTER — Other Ambulatory Visit: Payer: Self-pay

## 2019-04-16 ENCOUNTER — Inpatient Hospital Stay (HOSPITAL_BASED_OUTPATIENT_CLINIC_OR_DEPARTMENT_OTHER): Payer: Medicare Other | Admitting: Hospice and Palliative Medicine

## 2019-04-16 ENCOUNTER — Inpatient Hospital Stay: Payer: Medicare Other

## 2019-04-16 ENCOUNTER — Inpatient Hospital Stay (HOSPITAL_BASED_OUTPATIENT_CLINIC_OR_DEPARTMENT_OTHER): Payer: Medicare Other | Admitting: Oncology

## 2019-04-16 ENCOUNTER — Inpatient Hospital Stay: Payer: Medicare Other | Attending: Oncology

## 2019-04-16 VITALS — BP 103/68 | HR 88 | Temp 97.2°F | Resp 18

## 2019-04-16 VITALS — BP 105/57 | HR 99 | Temp 96.7°F | Resp 18 | Wt 145.9 lb

## 2019-04-16 DIAGNOSIS — Z87891 Personal history of nicotine dependence: Secondary | ICD-10-CM | POA: Diagnosis not present

## 2019-04-16 DIAGNOSIS — Z809 Family history of malignant neoplasm, unspecified: Secondary | ICD-10-CM | POA: Insufficient documentation

## 2019-04-16 DIAGNOSIS — Z7289 Other problems related to lifestyle: Secondary | ICD-10-CM | POA: Diagnosis not present

## 2019-04-16 DIAGNOSIS — Z79899 Other long term (current) drug therapy: Secondary | ICD-10-CM | POA: Diagnosis not present

## 2019-04-16 DIAGNOSIS — C220 Liver cell carcinoma: Secondary | ICD-10-CM | POA: Insufficient documentation

## 2019-04-16 DIAGNOSIS — I10 Essential (primary) hypertension: Secondary | ICD-10-CM | POA: Diagnosis not present

## 2019-04-16 DIAGNOSIS — M47816 Spondylosis without myelopathy or radiculopathy, lumbar region: Secondary | ICD-10-CM | POA: Diagnosis not present

## 2019-04-16 DIAGNOSIS — Z5112 Encounter for antineoplastic immunotherapy: Secondary | ICD-10-CM | POA: Diagnosis not present

## 2019-04-16 DIAGNOSIS — J948 Other specified pleural conditions: Secondary | ICD-10-CM | POA: Insufficient documentation

## 2019-04-16 DIAGNOSIS — Z5111 Encounter for antineoplastic chemotherapy: Secondary | ICD-10-CM | POA: Insufficient documentation

## 2019-04-16 DIAGNOSIS — E871 Hypo-osmolality and hyponatremia: Secondary | ICD-10-CM | POA: Diagnosis not present

## 2019-04-16 DIAGNOSIS — C3492 Malignant neoplasm of unspecified part of left bronchus or lung: Secondary | ICD-10-CM

## 2019-04-16 DIAGNOSIS — M25512 Pain in left shoulder: Secondary | ICD-10-CM | POA: Diagnosis not present

## 2019-04-16 DIAGNOSIS — I700 Atherosclerosis of aorta: Secondary | ICD-10-CM | POA: Insufficient documentation

## 2019-04-16 DIAGNOSIS — K8020 Calculus of gallbladder without cholecystitis without obstruction: Secondary | ICD-10-CM | POA: Diagnosis not present

## 2019-04-16 DIAGNOSIS — C7802 Secondary malignant neoplasm of left lung: Secondary | ICD-10-CM

## 2019-04-16 DIAGNOSIS — G893 Neoplasm related pain (acute) (chronic): Secondary | ICD-10-CM | POA: Diagnosis not present

## 2019-04-16 DIAGNOSIS — K449 Diaphragmatic hernia without obstruction or gangrene: Secondary | ICD-10-CM | POA: Diagnosis not present

## 2019-04-16 DIAGNOSIS — M5137 Other intervertebral disc degeneration, lumbosacral region: Secondary | ICD-10-CM | POA: Insufficient documentation

## 2019-04-16 DIAGNOSIS — I712 Thoracic aortic aneurysm, without rupture: Secondary | ICD-10-CM | POA: Insufficient documentation

## 2019-04-16 DIAGNOSIS — B1920 Unspecified viral hepatitis C without hepatic coma: Secondary | ICD-10-CM | POA: Diagnosis not present

## 2019-04-16 DIAGNOSIS — Z515 Encounter for palliative care: Secondary | ICD-10-CM

## 2019-04-16 DIAGNOSIS — J439 Emphysema, unspecified: Secondary | ICD-10-CM | POA: Insufficient documentation

## 2019-04-16 DIAGNOSIS — C7951 Secondary malignant neoplasm of bone: Secondary | ICD-10-CM

## 2019-04-16 LAB — URINALYSIS, DIPSTICK ONLY
Bilirubin Urine: NEGATIVE
Glucose, UA: NEGATIVE mg/dL
Hgb urine dipstick: NEGATIVE
Ketones, ur: NEGATIVE mg/dL
Leukocytes,Ua: NEGATIVE
Nitrite: NEGATIVE
Protein, ur: NEGATIVE mg/dL
Specific Gravity, Urine: 1.011 (ref 1.005–1.030)
pH: 5 (ref 5.0–8.0)

## 2019-04-16 LAB — COMPREHENSIVE METABOLIC PANEL
ALT: 25 U/L (ref 0–44)
AST: 30 U/L (ref 15–41)
Albumin: 3.3 g/dL — ABNORMAL LOW (ref 3.5–5.0)
Alkaline Phosphatase: 58 U/L (ref 38–126)
Anion gap: 10 (ref 5–15)
BUN: 10 mg/dL (ref 8–23)
CO2: 25 mmol/L (ref 22–32)
Calcium: 8.8 mg/dL — ABNORMAL LOW (ref 8.9–10.3)
Chloride: 102 mmol/L (ref 98–111)
Creatinine, Ser: 0.76 mg/dL (ref 0.61–1.24)
GFR calc Af Amer: >60 mL/min (ref >60)
GFR calc non Af Amer: >60 mL/min (ref >60)
Glucose, Bld: 102 mg/dL — ABNORMAL HIGH (ref 70–99)
Potassium: 3 mmol/L — ABNORMAL LOW (ref 3.5–5.1)
Sodium: 137 mmol/L (ref 135–145)
Total Bilirubin: 0.7 mg/dL (ref 0.3–1.2)
Total Protein: 7.9 g/dL (ref 6.5–8.1)

## 2019-04-16 LAB — CBC WITH DIFFERENTIAL/PLATELET
Abs Immature Granulocytes: 0.04 10*3/uL (ref 0.00–0.07)
Basophils Absolute: 0 10*3/uL (ref 0.0–0.1)
Basophils Relative: 1 %
Eosinophils Absolute: 0.3 10*3/uL (ref 0.0–0.5)
Eosinophils Relative: 4 %
HCT: 41.9 % (ref 39.0–52.0)
Hemoglobin: 14.3 g/dL (ref 13.0–17.0)
Immature Granulocytes: 1 %
Lymphocytes Relative: 20 %
Lymphs Abs: 1.2 10*3/uL (ref 0.7–4.0)
MCH: 30.8 pg (ref 26.0–34.0)
MCHC: 34.1 g/dL (ref 30.0–36.0)
MCV: 90.1 fL (ref 80.0–100.0)
Monocytes Absolute: 0.7 10*3/uL (ref 0.1–1.0)
Monocytes Relative: 12 %
Neutro Abs: 3.8 10*3/uL (ref 1.7–7.7)
Neutrophils Relative %: 62 %
Platelets: 361 10*3/uL (ref 150–400)
RBC: 4.65 MIL/uL (ref 4.22–5.81)
RDW: 14 % (ref 11.5–15.5)
WBC: 6 10*3/uL (ref 4.0–10.5)
nRBC: 0 % (ref 0.0–0.2)

## 2019-04-16 LAB — TSH: TSH: 2.208 u[IU]/mL (ref 0.350–4.500)

## 2019-04-16 MED ORDER — SODIUM CHLORIDE 0.9 % IV SOLN
10.0000 mg | Freq: Once | INTRAVENOUS | Status: AC
  Administered 2019-04-16: 12:00:00 10 mg via INTRAVENOUS
  Filled 2019-04-16: qty 10

## 2019-04-16 MED ORDER — POTASSIUM CHLORIDE CRYS ER 20 MEQ PO TBCR: 20.0000 meq | EXTENDED_RELEASE_TABLET | Freq: Every day | ORAL | 0 refills | Status: DC

## 2019-04-16 MED ORDER — POTASSIUM CHLORIDE 20 MEQ/100ML IV SOLN
20.0000 meq | Freq: Once | INTRAVENOUS | Status: AC
  Administered 2019-04-16: 20 meq via INTRAVENOUS

## 2019-04-16 MED ORDER — SODIUM CHLORIDE 0.9 % IV SOLN
465.5000 mg | Freq: Once | INTRAVENOUS | Status: AC
  Administered 2019-04-16: 470 mg via INTRAVENOUS
  Filled 2019-04-16: qty 47

## 2019-04-16 MED ORDER — PALONOSETRON HCL INJECTION 0.25 MG/5ML
0.2500 mg | Freq: Once | INTRAVENOUS | Status: AC
  Administered 2019-04-16: 0.25 mg via INTRAVENOUS
  Filled 2019-04-16: qty 5

## 2019-04-16 MED ORDER — AMLODIPINE BESYLATE 5 MG PO TABS: 5.0000 mg | ORAL_TABLET | Freq: Every day | ORAL | 0 refills | Status: DC

## 2019-04-16 MED ORDER — SODIUM CHLORIDE 0.9 % IV SOLN
Freq: Once | INTRAVENOUS | Status: AC
  Filled 2019-04-16: qty 250

## 2019-04-16 MED ORDER — DIPHENHYDRAMINE HCL 50 MG/ML IJ SOLN
50.0000 mg | Freq: Once | INTRAMUSCULAR | Status: AC
  Administered 2019-04-16: 50 mg via INTRAVENOUS
  Filled 2019-04-16: qty 1

## 2019-04-16 MED ORDER — HEPARIN SOD (PORK) LOCK FLUSH 100 UNIT/ML IV SOLN
500.0000 [IU] | Freq: Once | INTRAVENOUS | Status: AC | PRN
  Administered 2019-04-16: 500 [IU]
  Filled 2019-04-16: qty 5

## 2019-04-16 MED ORDER — SODIUM CHLORIDE 0.9 % IV SOLN
150.0000 mg | Freq: Once | INTRAVENOUS | Status: AC
  Administered 2019-04-16: 150 mg via INTRAVENOUS
  Filled 2019-04-16: qty 5

## 2019-04-16 MED ORDER — SODIUM CHLORIDE 0.9 % IV SOLN
200.0000 mg/m2 | Freq: Once | INTRAVENOUS | Status: AC
  Administered 2019-04-16: 13:00:00 354 mg via INTRAVENOUS
  Filled 2019-04-16: qty 59

## 2019-04-16 MED ORDER — FAMOTIDINE IN NACL 20-0.9 MG/50ML-% IV SOLN
20.0000 mg | Freq: Once | INTRAVENOUS | Status: AC
  Administered 2019-04-16: 20 mg via INTRAVENOUS
  Filled 2019-04-16: qty 50

## 2019-04-16 NOTE — Progress Notes (Addendum)
Hematology/Oncology follow up note [facility] [phone] [phone]   Patient Care Team: Samuel Hood as PCP - General (Physician Assistant) Samuel Nab, RN as Registered Nurse Samuel Jacks, RN as Registered Nurse  REFERRING PROVIDER: Trinna Post, PA-C  CHIEF COMPLAINTS/REASON FOR VISIT:  Discussion of metastatic cancer management.  HISTORY OF PRESENTING ILLNESS:   Samuel Hood. is a  69 y.o.  male with PMH listed below was seen in consultation at the request of  Samuel Hood, Samuel M, PA-C  for evaluation of lung nodule in the liver lesion. Patient has a past medical history of hypertension, alcohol abuse, smoking emphysema.  He has had serial CTs done in the past.  CT images were independently reviewed by me. 07/26/2017 CT chest with contrast showed extensive pleural-parenchymal scarring within the left upper lobe with several areas of soft tissue nodularity measuring up to 1.6 cm.  In this patient who is at increased risk of lung cancer further investigation with PET scan is recommended. Small chronic appearance loculated hydropneumothorax overlies the left apex. Slowly enlarging lesion within the central portion of the right lobe of liver identified. Per note, patient supposed to have additional work-up done in December repeat CT scan which he did not  Patient was recently seen by primary care provider and has subsequent image done for follow-up. CT on 06/30/2018 showed multiple significantly enlarged paraspinal mass are noted concerning.  The largest measures 3.8 x 0.5 cm and there appears to be a lytic destruction of the adjacent T11 vertebral body.  Also noted is new solid density measuring 17 x 12 mm in the pleural parenchymal density in the left upper lobe noted on prior exam.  Concerning for malignancy.  4.4 ascending thoracic aortic aneurysm.  Recommend annual imaging.  Patient had PET scan done on  07/06/2018 Which showed there are 2 FDG avid lesion within the left upper lobe worrisome for primary lung neoplasm.  There is evidence of chest wall involvement.  Metastasis to the posterior mediastinum is identified with extension into T11 vertebra and possible involvement of the left T12 neural foramina.  Patient reports left shoulder pain.  Smoking half a pack a day not motivated with further questioning quitting Denies weight loss, hemoptysis, cough.  Chronic shortness of breath with exertion. He drinks alcohol daily. Lives with his girlfriend.  He has 5 adult kids  #Hepatitis C  # 6/1?2020  CT-guided biopsy of left-sided posterior mediastinal soft tissue adjacent to T10/11. Patient's case was discussed on tumor board. Consensus was reached that [facility] versus primary lung cancer with hepatoid adenocarcinoma features. Consensus was to proceed with liver biopsy first as liver mass was not FDG avid on PET scan.  Patient underwent liver biopsy and the present to discuss pathology reports.  ## 08/08/2018 Liver mass biopsy showed fragments of necrotic and calcified tissue with adjacent fibrous capsule Scant viable nonneoplastic liver tissue with steatosis, inflammation and non specific fibrosis.  # 08/28/2018 CT guided left upper lobe lung mass biopsy showed poorly differentiated adenocarcinoma of lung origin.  6/19-08/16/2018  Finished palliative radiation to paraspinal soft tissue mass. 09/07/2018 patient was started on lenvatinib 12 mg daily. 10/13/2018 SBRT to lung cancer lesion.  #Patient is on Zometa monthly #He was advised to take calcium supplement.  He reports he quit taking calcium as he broke out rash on her scalp, neck and chest after taking calcium. # 03/30/19 liver lesion biopsy showed poorly differentiated adenocarcinoma, compatible with lung primary.  INTERVAL HISTORY Samuel Hood. is a 69 y.o. male who has above history reviewed by me today presents for follow up visit for  assessment of prior to chemotherapy for the treatment of Samuel Hood and lung cancer. Patient has been started on fentanyl patch 12.5 MCG per hour every 72 hours, and oxycodone 5 mg every 4 hours as needed.  He rates his pain has improved, still feels need better pain control.  Currently he rates his pain control as 5-6 out of 10, pain includes left shoulder, left anterior chest wall, area around his port and the jaw. During interval, patient has had Mediport placement.   Review of Systems  Constitutional: Positive for fatigue. Negative for appetite change, chills, diaphoresis, fever and unexpected weight change.  HENT:   Negative for hearing loss, lump/mass, nosebleeds, sore throat and voice change.   Eyes: Negative for eye problems and icterus.  Respiratory: Negative for chest tightness, cough, hemoptysis, shortness of breath and wheezing.        Chest wall pain  Cardiovascular: Negative for chest pain and leg swelling.  Gastrointestinal: Negative for abdominal distention, abdominal pain, blood in stool, diarrhea, nausea and rectal pain.  Endocrine: Negative for hot flashes.  Genitourinary: Negative for bladder incontinence, difficulty urinating, dysuria, frequency, hematuria and nocturia.   Musculoskeletal: Negative for arthralgias, back pain, flank pain, gait problem and myalgias.  Skin: Negative for itching and rash.  Neurological: Negative for dizziness, extremity weakness, gait problem, headaches, light-headedness, numbness and seizures.  Hematological: Negative for adenopathy. Does not bruise/bleed easily.  Psychiatric/Behavioral: Negative for confusion and decreased concentration. The patient is not nervous/anxious.   Pain around his Mediport.  MEDICAL HISTORY:  Past Medical History:  Diagnosis Date  . Hepatocellular carcinoma metastatic to left lung (Samuel Hood) 07/24/2018  . History of angiography    left lower extremity  . Hypertension   . Small bowel obstruction ([place])     SURGICAL  HISTORY: Past Surgical History:  Procedure Laterality Date  . COLON SURGERY    . COLONOSCOPY W/ POLYPECTOMY    . COLONOSCOPY WITH PROPOFOL N/A 01/31/2018   Procedure: COLONOSCOPY WITH PROPOFOL;  Surgeon: Toledo, Benay Pike, MD;  Location: [facility] ENDOSCOPY;  Service: Gastroenterology;  Laterality: N/A;  . debridement fasciotomy leg, left Left 03/19/2016  . LAPAROTOMY N/A 09/27/2014   Procedure: EXPLORATORY LAPAROTOMY;  Surgeon: Dia Crawford III, MD;  Location: [facility] ORS;  Service: General;  Laterality: N/A;  . PORTA CATH INSERTION N/A 04/10/2019   Procedure: PORTA CATH INSERTION;  Surgeon: Katha Cabal, MD;  Location: [facility];  Service: Cardiovascular;  Laterality: N/A;    SOCIAL HISTORY: Social History   Socioeconomic History  . Marital status: Single    Spouse name: Not on file  . Number of children: Not on file  . Years of education: Not on file  . Highest education level: Not on file  Occupational History  . Occupation: retired  Tobacco Use  . Smoking status: Former Smoker    Packs/day: 0.25    Years: 50.00    Pack years: 12.50  . Smokeless tobacco: Never Used  . Tobacco comment: 1-2/week  Substance and Sexual Activity  . Alcohol use: Not Currently    Alcohol/week: 35.0 standard drinks    Types: 30 Cans of beer, 5 Shots of liquor per week  . Drug use: No  . Sexual activity: Not on file  Other Topics Concern  . Not on file  Social History Narrative  . Not on file  Social Determinants of Health   Financial Resource Strain: High Risk  . Difficulty of Paying Living Expenses: Hard  Food Insecurity: No Food Insecurity  . Worried About Charity fundraiser in the Last Year: Never true  . Ran Out of Food in the Last Year: Never true  Transportation Needs: No Transportation Needs  . Lack of Transportation (Medical): No  . Lack of Transportation (Non-Medical): No  Physical Activity: Sufficiently Active  . Days of Exercise per Week: 7 days  . Minutes of  Exercise per Session: 30 min  Stress: No Stress Concern Present  . Feeling of Stress : Not at all  Social Connections: Unknown  . Frequency of Communication with Friends and Family: More than three times a week  . Frequency of Social Gatherings with Friends and Family: More than three times a week  . Attends Religious Services: More than 4 times per year  . Active Member of Clubs or Organizations: Not on file  . Attends Archivist Meetings: Not on file  . Marital Status: Not on file  Intimate Partner Violence: Unknown  . Fear of Current or Ex-Partner: Patient refused  . Emotionally Abused: No  . Physically Abused: No  . Sexually Abused: No    FAMILY HISTORY: Family History  Problem Relation Age of Onset  . Cancer Mother   . Cancer Father     ALLERGIES:  is allergic to 5-alpha reductase inhibitors.  MEDICATIONS:  Current Outpatient Medications  Medication Sig Dispense Refill  . amLODipine (NORVASC) 5 MG tablet Take 1 tablet (5 mg total) by mouth daily. 30 tablet 0  . fentaNYL (DURAGESIC) 12 MCG/HR Place 1 patch onto the skin every 3 (three) days. 10 patch 0  . lidocaine-prilocaine (EMLA) cream Apply to affected area once (Patient taking differently: Apply 1 application topically daily as needed (port acess). ) 30 g 3  . loperamide (IMODIUM) 2 MG capsule Take 1 capsule (2 mg total) by mouth See admin instructions. With onset of loose stool, take 14m followed by 215mevery 2 hours until 12 hours have passed without loose bowel movement. Maximum: 16 mg/day 60 capsule 1  . Multiple Vitamin (MULTIVITAMIN) capsule Take 1 capsule by mouth daily.    . Marland KitchenxyCODONE (ROXICODONE) 5 MG immediate release tablet Take 1 tablet (5 mg total) by mouth every 4 (four) hours as needed for severe pain or breakthrough pain. 60 tablet 0  . polyethylene glycol (MIRALAX) 17 g packet Take 17 g by mouth daily. 21 each 0  . prochlorperazine (COMPAZINE) 10 MG tablet Take 1 tablet (10 mg total) by mouth  every 6 (six) hours as needed (Nausea or vomiting). 30 tablet 1  . senna (SENOKOT) 8.6 MG TABS tablet Take 1 tablet (8.6 mg total) by mouth daily. (Patient taking differently: Take 1 tablet by mouth daily as needed for mild constipation. ) 120 tablet 0  . dexamethasone (DECADRON) 4 MG tablet Take 2 tablets (8 mg total) by mouth daily. Start the day after carboplatin chemotherapy for 3 days. (Patient not taking: Reported on 04/13/2019) 30 tablet 1  . NARCAN 4 MG/0.1ML LIQD nasal spray kit 1 spray once.    . ondansetron (ZOFRAN) 8 MG tablet Take 1 tablet (8 mg total) by mouth 2 (two) times daily as needed for refractory nausea / vomiting. Start on day 3 after carboplatin chemo. (Patient not taking: Reported on 04/13/2019) 30 tablet 1  . potassium chloride SA (KLOR-CON) 20 MEQ tablet Take 1 tablet (20 mEq total) by  mouth daily. 7 tablet 0   No current facility-administered medications for this visit.   Facility-Administered Medications Ordered in Other Visits  Medication Dose Route Frequency Provider Last Rate Last Admin  . CARBOplatin (PARAPLATIN) 470 mg in sodium chloride 0.9 % 250 mL chemo infusion  470 mg Intravenous Once Earlie Server, MD      . dexamethasone (DECADRON) 10 mg in sodium chloride 0.9 % 50 mL IVPB  10 mg Intravenous Once Earlie Server, MD      . diphenhydrAMINE (BENADRYL) injection 50 mg  50 mg Intravenous Once Earlie Server, MD      . famotidine (PEPCID) IVPB 20 mg premix  20 mg Intravenous Once Earlie Server, MD      . fosaprepitant (EMEND) 150 mg in sodium chloride 0.9 % 145 mL IVPB  150 mg Intravenous Once Earlie Server, MD      . heparin lock flush 100 unit/mL  500 Units Intracatheter Once PRN Earlie Server, MD      . PACLitaxel (TAXOL) 354 mg in sodium chloride 0.9 % 500 mL chemo infusion (> '80mg'$ /m2)  200 mg/m2 (Treatment Plan Recorded) Intravenous Once Earlie Server, MD      . palonosetron (ALOXI) injection 0.25 mg  0.25 mg Intravenous Once Earlie Server, MD      . potassium chloride 20 mEq in 100 mL IVPB  20 mEq  Intravenous Once Earlie Server, MD 100 mL/hr at 04/16/19 1009 20 mEq at 04/16/19 1009     PHYSICAL EXAMINATION: ECOG PERFORMANCE STATUS: 1 - Symptomatic but completely ambulatory Vitals:   04/16/19 0842  BP: (!) 105/57  Pulse: 99  Resp: 18  Temp: (!) 96.7 F (35.9 C)   Filed Weights   04/16/19 0842  Weight: 145 lb 14.4 oz (66.2 kg)    Physical Exam Constitutional:      General: He is not in acute distress.    Comments: Thin built, walk independently  HENT:     Head: Normocephalic and atraumatic.  Eyes:     General: No scleral icterus.    Pupils: Pupils are equal, round, and reactive to light.  Cardiovascular:     Rate and Rhythm: Normal rate and regular rhythm.     Heart sounds: Normal heart sounds.  Pulmonary:     Effort: Pulmonary effort is normal. No respiratory distress.     Breath sounds: No wheezing.     Comments: Decreased breath sound bilaterally. Abdominal:     General: Bowel sounds are normal. There is no distension.     Palpations: Abdomen is soft. There is no mass.     Tenderness: There is no abdominal tenderness.  Musculoskeletal:        General: No deformity. Normal range of motion.     Cervical back: Normal range of motion and neck supple.     Comments: Palpable left anterior chest wall/breast pain Possible left gynecomastia  Skin:    General: Skin is warm and dry.     Findings: No erythema or rash.  Neurological:     Mental Status: He is alert and oriented to person, place, and time. Mental status is at baseline.     Cranial Nerves: No cranial nerve deficit.     Coordination: Coordination normal.  Psychiatric:        Mood and Affect: Mood normal.        Behavior: Behavior normal.        Thought Content: Thought content normal.     LABORATORY DATA:  I have reviewed  the data as listed Lab Results  Component Value Date   WBC 6.0 04/16/2019   HGB 14.3 04/16/2019   HCT 41.9 04/16/2019   MCV 90.1 04/16/2019   PLT 361 04/16/2019   Recent Labs     03/28/19 1014 04/06/19 1308 04/16/19 0806  NA 138 138 137  K 3.6 3.8 3.0*  CL 101 100 102  CO2 _0 GLUCOSE 102* 87 102*  BUN 7* 8 10  CREATININE 0.69 0.71 0.76  CALCIUM 9.4 9.3 8.8*  GFRNONAA >60 >60 >60  GFRAA >60 >60 >60  PROT 8.5* 8.1 7.9  ALBUMIN 3.8 3.5 3.3*  AST 43* 36 30  ALT 41 40 25  ALKPHOS 65 78 58  BILITOT 0.6 0.6 0.7   Iron/TIBC/Ferritin/ %Sat No results found for: IRON, TIBC, FERRITIN, IRONPCTSAT   RADIOGRAPHIC STUDIES: I have personally reviewed the radiological images as listed and agreed with the findings in the report. CT Chest W Contrast  Result Date: 03/26/2019 CLINICAL DATA:  Restaging left lung cancer. Hepatocellular carcinoma restaging. EXAM: CT CHEST, ABDOMEN, AND PELVIS WITH CONTRAST TECHNIQUE: Multidetector CT imaging of the chest, abdomen and pelvis was performed following the standard protocol during bolus administration of intravenous contrast. CONTRAST:  145m OMNIPAQUE IOHEXOL 300 MG/ML  SOLN COMPARISON:  03/24/2019 and 11/29/2018 FINDINGS: CT CHEST FINDINGS Cardiovascular: Stable ascending thoracic aortic aneurysm, 4.2 cm in diameter. Coronary, aortic arch, and branch vessel atherosclerotic vascular disease. Prominence of the azygos vein. Mediastinum/Nodes: No pathologic adenopathy identified. Lungs/Pleura: Prominent emphysema. Bilateral airway thickening. Architectural distortion, scarring, and some chronic nodularity in the left upper lobe, much of which is likely therapy related, and not appreciably changed. Musculoskeletal: Stable mild left paraspinal density at the T11 level with residual erosion along the left anterior T11 vertebral body. The soft tissue density adjacent to T11 is difficult to completely separate from the adjacent azygos vein but measures about 1.6 by 1.5 cm on image 48/2, and minimally are erodes the adjacent left lateral cortex of T11. This is roughly stable from 03/24/2019 exam. CT ABDOMEN PELVIS FINDINGS Hepatobiliary:  Sharply defined segment 8 mass with mild internal heterogeneity measures 3.1 by 2.9 cm, previously 3.2 by 2.9 cm. On the prior exam was query whether this lesion occluding part of the hepatic vein, certainly the lesion is closely associated with the right hepatic vein although definite occlusion is not observed. Adjacent rim enhancing lesion in segment 81.5 by 1.5 cm, stable. A segment 6 lesion with central washout and peripheral enhancement measures about 2.2 by 2.4 cm on image 65/2, stable. Ill-defined hypodense lesion posteriorly in the right hepatic lobe approximately 0.7 cm in diameter on image 71/2. Overall the hepatic lesions are unchanged. 1.3 cm gallstone in the gallbladder. The gallbladder is mildly contracted. No biliary dilatation. Pancreas: Unremarkable Spleen: Unremarkable Adrenals/Urinary Tract: Both adrenal glands appear normal. Stable right renal hypodense lesions, likely cysts. Stomach/Bowel: Swirling of the small bowel mesentery in the central pelvis but without dilated bowel to suggest a significant volvulus. Stable postoperative findings along proximal sigmoid colon margin. Vascular/Lymphatic: Aortoiliac atherosclerotic vascular disease. No pathologic adenopathy identified. Reproductive: Speckled calcifications along the tunica albuginea the penis. Prostate gland unchanged. Other: No supplemental non-categorized findings. Musculoskeletal: Bridging spurring of both sacroiliac joints. Grade 1 anterolisthesis at L5-S1. Lumbar spondylosis and degenerative disc disease causing multilevel impingement most striking at L5-S1. IMPRESSION: 1. Essentially stable appearance of the hepatic metastatic lesions, not unexpected given that prior exam was from 2 days ago. The left paraspinal soft tissue density  at T11 is likewise stable. 2. Stable architectural distortion, scarring, and chronic nodularity in the left upper lobe, much of which is likely therapy related. 3. Swirling of the small bowel mesenteric  vessels in the pelvis, but no dilated bowel to suggest a significant volvulus. 4. Other imaging findings of potential clinical significance: Airway thickening is present, suggesting bronchitis or reactive airways disease. Stable ascending thoracic aortic aneurysm, 4.2 cm in diameter. Cholelithiasis. Lumbar spondylosis and degenerative disc disease causing multilevel impingement most striking at L5-S1. Bridging spurring of both sacroiliac joints. Aortic Atherosclerosis (ICD10-I70.0) and Emphysema (ICD10-J43.9). Electronically Signed   By: Van Clines Hood.D.   On: 03/26/2019 10:58   CT Angio Chest PE W and/or Wo Contrast  Result Date: 03/24/2019 CLINICAL DATA:  69 year old male with chest pain. History of hepatocellular carcinoma/hepatocellular adenocarcinoma with a positive biopsy in the posterior mediastinum. Subsequent biopsy of liver mass was negative. EXAM: CT ANGIOGRAPHY CHEST CT ABDOMEN AND PELVIS WITH CONTRAST TECHNIQUE: Multidetector CT imaging of the chest was performed using the standard protocol during bolus administration of intravenous contrast. Multiplanar CT image reconstructions and MIPs were obtained to evaluate the vascular anatomy. Multidetector CT imaging of the abdomen and pelvis was performed using the standard protocol during bolus administration of intravenous contrast. CONTRAST:  66m OMNIPAQUE IOHEXOL 350 MG/ML SOLN COMPARISON:  11/29/2018, CT 06/18/2014, 07/26/2017, 06/30/2018 FINDINGS: CTA CHEST FINDINGS Cardiovascular: Heart: Heart size unchanged. No pericardial fluid/thickening. Calcifications of the left main, left anterior descending, circumflex, right coronary arteries. Aorta: Greatest diameter of the ascending aorta estimated 42 mm which is unchanged from the comparison. Opacification of the aorta it is limited given the timing of the contrast bolus for the pulmonary arteries. Common origin of the innominate artery an the left common carotid artery. Mild atherosclerosis of  the aortic arch. Pulmonary arteries: No central, lobar, segmental, or proximal subsegmental filling defects. Pulmonary arteries are attenuated within the left upper lobe secondary to treatment effect. Mediastinum/Nodes: No mediastinal adenopathy. Unremarkable appearance of the thoracic esophagus. Nodularity of the posterior mediastinum is significantly decreased as compared to the pre treatment CT of 06/30/2018. Unremarkable thoracic inlet. Lungs/Pleura: Advanced paraseptal and centrilobular emphysema, including bullous changes at the bilateral lung apices. Redemonstration of treatment changes of the left upper lung, with architectural distortion, scarring, and pleural thickening persisting. Subpleural reticulation at the right lower lobe lung base. No confluent airspace disease. No pleural effusion. Review of the MIP images confirms the above findings. CT ABDOMEN and PELVIS FINDINGS Hepatobiliary: Redemonstration of the heterogeneous mass within the right liver, segment 8, with well-defined border and a diameter of 2.7 cm, unchanged. Compared to prior CT there is a new satellite nodule extending inferior and lateral to this lesion on image 15 of series 4. Second hepatic lesion measuring 2.1 cm within segment 6 on image 22 of series 4, new. Third a Paddock lesion segment 6 at the tip of the liver, image 27 of series 4 Additionally there is a heterogeneously hypoattenuating/enhancing region of the left liver with what appears to be occlusion of the left hepatic vein (image 14 of series 4. Hyper dense material in the dependent aspect of the gallbladder, without inflammatory changes. Pancreas: Unremarkable Spleen: Unremarkable Adrenals/Urinary Tract: - Right adrenal gland:  Unremarkable - Left adrenal gland: Unremarkable. - Right kidney: No hydronephrosis, nephrolithiasis, inflammation, or ureteral dilation. Rounded lesions on the lateral cortex of the right kidney, most likely benign, unchanged - Left Kidney: No  hydronephrosis, nephrolithiasis, inflammation, or ureteral dilation. No focal lesion. - Urinary Bladder:  Unremarkable. Stomach/Bowel: - Stomach: Hiatal hernia with otherwise unremarkable stomach - Small bowel: Unremarkable - Appendix: Normal - Colon: Mild stool burden.  No focal inflammatory changes. Vascular/Lymphatic: Atherosclerotic calcifications of the abdominal aorta and bilateral iliac arteries. Proximal femoral arteries remain patent. No pelvic or abdominal adenopathy. Reproductive: Transverse diameter of the prostate measures 41 mm Other: Surgical changes along the midline abdomen with laxity in the midline. Small bowel loops are immediately subjacent to the midline laparotomy site Musculoskeletal: Degenerative changes of the thoracolumbar spine. Vacuum disc phenomenon spans all levels of the lumbar spine. Lytic lesion of T11 with sclerotic border. While this is unchanged in size from the most recent comparison CT, it is new from 2019 and in the region of the pathologically proven tumor of the posterior mediastinum. Review of the MIP images confirms the above findings. IMPRESSION: CT negative for pulmonary emboli, with no acute finding to account for chest pain. Progression of hepatic malignancy, with at least 3 new hepatic lesions, separate from the previously biopsied lesion of the right liver. In addition there is questionable new infiltrative disease of the left liver which appears to occlude the left hepatic vein. Referral for follow-up with oncology is indicated, as well as consideration of contrast-enhanced liver MRI. Treatment changes of the left upper lobe without evidence of local recurrence/progression. In addition, the T11 malignant lytic lesion and biopsy proven tumor of left posterior mediastinal/paravertebral T11 level are unchanged. Advanced emphysema.  Emphysema (ICD10-J43.9). Aortic atherosclerosis and iliac arterial disease. Aortic Atherosclerosis (ICD10-I70.0). Unchanged diameter of  ascending aorta, 4.2 cm. Recommend annual imaging followup by CTA or MRA. This recommendation follows 2010 ACCF/AHA/AATS/ACR/ASA/SCA/SCAI/SIR/STS/SVM Guidelines for the Diagnosis and Management of Patients with Thoracic Aortic Disease. Circulation. 2010; 121: Q259-D638. Aortic aneurysm NOS (ICD10-I71.9) Electronically Signed   By: Corrie Mckusick D.O.   On: 03/24/2019 08:53   NM Bone Scan Whole Body  Result Date: 04/14/2019 CLINICAL DATA:  Stage IV lung cancer.  No recent falls or trauma. EXAM: NUCLEAR MEDICINE WHOLE BODY BONE SCAN TECHNIQUE: Whole body anterior and posterior images were obtained approximately 3 hours after intravenous injection of radiopharmaceutical. RADIOPHARMACEUTICALS:  22.9 mCi Technetium-51mMDP IV COMPARISON:  03/26/2019 chest abdomen and pelvic CTs. FINDINGS: Degenerative changes of the left knee. No abnormal uptake within the axial or appendicular skeleton otherwise. Physiologic renal and bladder excretion. IMPRESSION: No findings of osseous metastasis. Electronically Signed   By: KAbigail MiyamotoM.D.   On: 04/14/2019 09:54   CT Abdomen Pelvis W Contrast  Result Date: 03/26/2019 CLINICAL DATA:  Restaging left lung cancer. Hepatocellular carcinoma restaging. EXAM: CT CHEST, ABDOMEN, AND PELVIS WITH CONTRAST TECHNIQUE: Multidetector CT imaging of the chest, abdomen and pelvis was performed following the standard protocol during bolus administration of intravenous contrast. CONTRAST:  1049mOMNIPAQUE IOHEXOL 300 MG/ML  SOLN COMPARISON:  03/24/2019 and 11/29/2018 FINDINGS: CT CHEST FINDINGS Cardiovascular: Stable ascending thoracic aortic aneurysm, 4.2 cm in diameter. Coronary, aortic arch, and branch vessel atherosclerotic vascular disease. Prominence of the azygos vein. Mediastinum/Nodes: No pathologic adenopathy identified. Lungs/Pleura: Prominent emphysema. Bilateral airway thickening. Architectural distortion, scarring, and some chronic nodularity in the left upper lobe, much of which  is likely therapy related, and not appreciably changed. Musculoskeletal: Stable mild left paraspinal density at the T11 level with residual erosion along the left anterior T11 vertebral body. The soft tissue density adjacent to T11 is difficult to completely separate from the adjacent azygos vein but measures about 1.6 by 1.5 cm on image 48/2, and minimally are erodes the adjacent  left lateral cortex of T11. This is roughly stable from 03/24/2019 exam. CT ABDOMEN PELVIS FINDINGS Hepatobiliary: Sharply defined segment 8 mass with mild internal heterogeneity measures 3.1 by 2.9 cm, previously 3.2 by 2.9 cm. On the prior exam was query whether this lesion occluding part of the hepatic vein, certainly the lesion is closely associated with the right hepatic vein although definite occlusion is not observed. Adjacent rim enhancing lesion in segment 81.5 by 1.5 cm, stable. A segment 6 lesion with central washout and peripheral enhancement measures about 2.2 by 2.4 cm on image 65/2, stable. Ill-defined hypodense lesion posteriorly in the right hepatic lobe approximately 0.7 cm in diameter on image 71/2. Overall the hepatic lesions are unchanged. 1.3 cm gallstone in the gallbladder. The gallbladder is mildly contracted. No biliary dilatation. Pancreas: Unremarkable Spleen: Unremarkable Adrenals/Urinary Tract: Both adrenal glands appear normal. Stable right renal hypodense lesions, likely cysts. Stomach/Bowel: Swirling of the small bowel mesentery in the central pelvis but without dilated bowel to suggest a significant volvulus. Stable postoperative findings along proximal sigmoid colon margin. Vascular/Lymphatic: Aortoiliac atherosclerotic vascular disease. No pathologic adenopathy identified. Reproductive: Speckled calcifications along the tunica albuginea the penis. Prostate gland unchanged. Other: No supplemental non-categorized findings. Musculoskeletal: Bridging spurring of both sacroiliac joints. Grade 1 anterolisthesis  at L5-S1. Lumbar spondylosis and degenerative disc disease causing multilevel impingement most striking at L5-S1. IMPRESSION: 1. Essentially stable appearance of the hepatic metastatic lesions, not unexpected given that prior exam was from 2 days ago. The left paraspinal soft tissue density at T11 is likewise stable. 2. Stable architectural distortion, scarring, and chronic nodularity in the left upper lobe, much of which is likely therapy related. 3. Swirling of the small bowel mesenteric vessels in the pelvis, but no dilated bowel to suggest a significant volvulus. 4. Other imaging findings of potential clinical significance: Airway thickening is present, suggesting bronchitis or reactive airways disease. Stable ascending thoracic aortic aneurysm, 4.2 cm in diameter. Cholelithiasis. Lumbar spondylosis and degenerative disc disease causing multilevel impingement most striking at L5-S1. Bridging spurring of both sacroiliac joints. Aortic Atherosclerosis (ICD10-I70.0) and Emphysema (ICD10-J43.9). Electronically Signed   By: Van Clines Hood.D.   On: 03/26/2019 10:58   CT ABDOMEN PELVIS W CONTRAST  Result Date: 03/24/2019 CLINICAL DATA:  69 year old male with chest pain. History of hepatocellular carcinoma/hepatocellular adenocarcinoma with a positive biopsy in the posterior mediastinum. Subsequent biopsy of liver mass was negative. EXAM: CT ANGIOGRAPHY CHEST CT ABDOMEN AND PELVIS WITH CONTRAST TECHNIQUE: Multidetector CT imaging of the chest was performed using the standard protocol during bolus administration of intravenous contrast. Multiplanar CT image reconstructions and MIPs were obtained to evaluate the vascular anatomy. Multidetector CT imaging of the abdomen and pelvis was performed using the standard protocol during bolus administration of intravenous contrast. CONTRAST:  61m OMNIPAQUE IOHEXOL 350 MG/ML SOLN COMPARISON:  11/29/2018, CT 06/18/2014, 07/26/2017, 06/30/2018 FINDINGS: CTA CHEST FINDINGS  Cardiovascular: Heart: Heart size unchanged. No pericardial fluid/thickening. Calcifications of the left main, left anterior descending, circumflex, right coronary arteries. Aorta: Greatest diameter of the ascending aorta estimated 42 mm which is unchanged from the comparison. Opacification of the aorta it is limited given the timing of the contrast bolus for the pulmonary arteries. Common origin of the innominate artery an the left common carotid artery. Mild atherosclerosis of the aortic arch. Pulmonary arteries: No central, lobar, segmental, or proximal subsegmental filling defects. Pulmonary arteries are attenuated within the left upper lobe secondary to treatment effect. Mediastinum/Nodes: No mediastinal adenopathy. Unremarkable appearance of the thoracic esophagus.  Nodularity of the posterior mediastinum is significantly decreased as compared to the pre treatment CT of 06/30/2018. Unremarkable thoracic inlet. Lungs/Pleura: Advanced paraseptal and centrilobular emphysema, including bullous changes at the bilateral lung apices. Redemonstration of treatment changes of the left upper lung, with architectural distortion, scarring, and pleural thickening persisting. Subpleural reticulation at the right lower lobe lung base. No confluent airspace disease. No pleural effusion. Review of the MIP images confirms the above findings. CT ABDOMEN and PELVIS FINDINGS Hepatobiliary: Redemonstration of the heterogeneous mass within the right liver, segment 8, with well-defined border and a diameter of 2.7 cm, unchanged. Compared to prior CT there is a new satellite nodule extending inferior and lateral to this lesion on image 15 of series 4. Second hepatic lesion measuring 2.1 cm within segment 6 on image 22 of series 4, new. Third a [place] lesion segment 6 at the tip of the liver, image 27 of series 4 Additionally there is a heterogeneously hypoattenuating/enhancing region of the left liver with what appears to be occlusion  of the left hepatic vein (image 14 of series 4. Hyper dense material in the dependent aspect of the gallbladder, without inflammatory changes. Pancreas: Unremarkable Spleen: Unremarkable Adrenals/Urinary Tract: - Right adrenal gland:  Unremarkable - Left adrenal gland: Unremarkable. - Right kidney: No hydronephrosis, nephrolithiasis, inflammation, or ureteral dilation. Rounded lesions on the lateral cortex of the right kidney, most likely benign, unchanged - Left Kidney: No hydronephrosis, nephrolithiasis, inflammation, or ureteral dilation. No focal lesion. - Urinary Bladder: Unremarkable. Stomach/Bowel: - Stomach: Hiatal hernia with otherwise unremarkable stomach - Small bowel: Unremarkable - Appendix: Normal - Colon: Mild stool burden.  No focal inflammatory changes. Vascular/Lymphatic: Atherosclerotic calcifications of the abdominal aorta and bilateral iliac arteries. Proximal femoral arteries remain patent. No pelvic or abdominal adenopathy. Reproductive: Transverse diameter of the prostate measures 41 mm Other: Surgical changes along the midline abdomen with laxity in the midline. Small bowel loops are immediately subjacent to the midline laparotomy site Musculoskeletal: Degenerative changes of the thoracolumbar spine. Vacuum disc phenomenon spans all levels of the lumbar spine. Lytic lesion of T11 with sclerotic border. While this is unchanged in size from the most recent comparison CT, it is new from 2019 and in the region of the pathologically proven tumor of the posterior mediastinum. Review of the MIP images confirms the above findings. IMPRESSION: CT negative for pulmonary emboli, with no acute finding to account for chest pain. Progression of hepatic malignancy, with at least 3 new hepatic lesions, separate from the previously biopsied lesion of the right liver. In addition there is questionable new infiltrative disease of the left liver which appears to occlude the left hepatic vein. Referral for  follow-up with oncology is indicated, as well as consideration of contrast-enhanced liver MRI. Treatment changes of the left upper lobe without evidence of local recurrence/progression. In addition, the T11 malignant lytic lesion and biopsy proven tumor of left posterior mediastinal/paravertebral T11 level are unchanged. Advanced emphysema.  Emphysema (ICD10-J43.9). Aortic atherosclerosis and iliac arterial disease. Aortic Atherosclerosis (ICD10-I70.0). Unchanged diameter of ascending aorta, 4.2 cm. Recommend annual imaging followup by CTA or MRA. This recommendation follows 2010 ACCF/AHA/AATS/ACR/ASA/SCA/SCAI/SIR/STS/SVM Guidelines for the Diagnosis and Management of Patients with Thoracic Aortic Disease. Circulation. 2010; 121: X902-I097. Aortic aneurysm NOS (ICD10-I71.9) Electronically Signed   By: Corrie Mckusick D.O.   On: 03/24/2019 08:53   PERIPHERAL VASCULAR CATHETERIZATION  Result Date: 04/10/2019 See Op Note  US BIOPSY (LIVER)  Result Date: 03/30/2019 INDICATION: History of lung cancer and hepatocellular carcinoma. New  right inferior hepatic lesion EXAM: ULTRASOUND GUIDED CORE BIOPSY OF NEW RIGHT INFERIOR HEPATIC LESION MEDICATIONS: 1% LIDOCAINE LOCAL ANESTHESIA/SEDATION: Fentanyl 100 mcg IV; Versed 2.0 mg IV Moderate Sedation Time:  13 MINUTES The patient was continuously monitored during the procedure by the interventional radiology nurse under my direct supervision. PROCEDURE: The procedure, risks, benefits, and alternatives were explained to the patient. Questions regarding the procedure were encouraged and answered. The patient understands and consents to the procedure. The right anterior subcostal area was prepped with ChloraPrep in a sterile fashion, and a sterile drape was applied covering the operative field. A sterile gown and sterile gloves were used for the procedure. Local anesthesia was provided with 1% Lidocaine. previous imaging reviewed. Preliminary ultrasound performed of the right  hepatic lobe from a right upper quadrant subcostal approach. The right inferior hepatic lesion was localized and marked. Under sterile conditions and local anesthesia, a 17 gauge coaxial guide was advanced to the lesion. Images obtained for documentation. 2 18 gauge core biopsies obtained. Samples were intact and non fragmented. These were placed in formalin. Needle removed. Needle tract occluded with Gel-Foam. Postprocedure imaging demonstrates no hemorrhage or hematoma. Patient tolerated biopsy well. COMPLICATIONS: None immediate. FINDINGS: Imaging confirms needle placement in the right inferior hepatic lesion for core biopsy IMPRESSION: Successful ultrasound right inferior hepatic lesion 18 gauge core biopsies Electronically Signed   By: Jerilynn Mages.  Shick Hood.D.   On: 03/30/2019 10:34   DG Chest Port 1 View  Result Date: 03/24/2019 CLINICAL DATA:  Acute chest pain. Patient with LEFT lung cancer. EXAM: PORTABLE CHEST 1 VIEW COMPARISON:  08/01/2017 FINDINGS: Cardiomediastinal silhouette is unchanged. Scarring within the UPPER LEFT lung again noted. Emphysema again identified. There is no evidence of focal airspace disease, pulmonary edema, suspicious pulmonary nodule/mass, pleural effusion, or pneumothorax. No acute bony abnormalities are identified. IMPRESSION: No evidence of acute cardiopulmonary disease. Electronically Signed   By: Margarette Canada Hood.D.   On: 03/24/2019 07:39      ASSESSMENT & PLAN:  1. 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 Encounter for antineoplastic chemotherapy   3. Stage IV adenocarcinoma of lung, left ([place])    #Stage IV Lung adenocarcinoma.  03/30/19 liver lesion biopsy showed poorly differentiated adenocarcinoma, compatible with lung primary.  He also has Stage IV HCC-07/17/2018 paraspinal soft tissue biopsy Moderately differentiated HCC- treatment with Lenvatinib. I called patient's daughter and spoke to her over the phone as well as discussed with patient. I  recommend systemic chemotherapy with carboplatin, Taxol, bevacizumab, Tecentriq which will cover both [place] and lung cancer patient's insurance denied Tecentriq and bevacizumab and also closed his case without allowing me to do peer to peer appeal.  Currently cancer center in the process of helping patient to apply for chemotherapy drug assistance. I will proceed with carboplatin and Taxol today. Discussed with patient and daughter about rationale and potential chemotherapy side effects.  Both agree with proceeding with treatments today.  I also discussed with patient about antiemetics and dexamethasone instructions.  #Pain, patient feels pain medication regimen has been helping however he is due in a lot of pain before he can take next oxycodone dose.  Advised patient to increase fentanyl patch to 25 MCG/h every 72 hours.  Continue oxycodone 5 mg every 4-6 hours as needed. Bone scan was independently reviewed by me.  No osseous metastasis at this point.  #Sclerotic T11 lesion.  Continue Zometa every 4 to 6 weeks.  Patient declines taking calcium supplementation as it causes  rash.  Zometa was last given on 04/06/2019.  #Continue follow-up with palliative care service. We spent sufficient time to discuss many aspect of care, questions were answered to patient's satisfaction. All questions were answered. The patient knows to call the clinic with any problems questions or concerns.   Earlie Server, MD, PhD 04/16/2019  Addendum:Bevacizumab, Tecentriq were approved on 04/17/2019. Patient will receive treatments on 04/18/2019  Earlie Server

## 2019-04-16 NOTE — Progress Notes (Signed)
[facility]  [phone] [phone]   Name: Samuel Hood. Date: 04/16/2019 MRN: [id number]  DOB: 08/28/1950  Patient Care Team: Paulene Floor as PCP - General (Physician Assistant) Telford Nab, RN as Registered Nurse Clent Jacks, RN as Registered Nurse    REASON FOR CONSULTATION: Samuel Hood. is a 69 y.o. male with multiple medical problems including  IV adenocarcinoma of the lung (diagnosed 03/30/2019) metastatic to liver and bone and chest wall, also with stage IV HCC (diagnosed 07/17/2018).  PMH also notable for HCV, history of alcohol use/tobacco abuse.  Palliative care was consulted to help address goals and manage ongoing symptoms.  SOCIAL HISTORY:     reports that he has quit smoking. He has a 12.50 pack-year smoking history. He has never used smokeless tobacco. He reports previous alcohol use of about 35.0 standard drinks of alcohol per week. He reports that he does not use drugs.   Patient is a widower after having been married 27 years.  He lives at home with a girlfriend.  He has 3 daughters and a son.  His son is currently incarcerated and he reports having a strained relationship with a daughter due to substance abuse.  His daughter, [name], is most involved.  Patient previously worked in [employer] (medical).   ADVANCE DIRECTIVES:  On file  CODE STATUS:   PAST MEDICAL HISTORY: Past Medical History:  Diagnosis Date  . Hepatocellular carcinoma metastatic to left lung ([employer]) 07/24/2018  . History of angiography    left lower extremity  . Hypertension   . Small bowel obstruction ([place])     PAST SURGICAL HISTORY:  Past Surgical History:  Procedure Laterality Date  . COLON SURGERY    . COLONOSCOPY W/ POLYPECTOMY    . COLONOSCOPY WITH PROPOFOL N/A 01/31/2018   Procedure: COLONOSCOPY WITH PROPOFOL;  Surgeon: Toledo, Benay Pike, MD;  Location: [facility] ENDOSCOPY;  Service: Gastroenterology;  Laterality:  N/A;  . debridement fasciotomy leg, left Left 03/19/2016  . LAPAROTOMY N/A 09/27/2014   Procedure: EXPLORATORY LAPAROTOMY;  Surgeon: Dia Crawford III, MD;  Location: [facility] ORS;  Service: General;  Laterality: N/A;  . PORTA CATH INSERTION N/A 04/10/2019   Procedure: PORTA CATH INSERTION;  Surgeon: Katha Cabal, MD;  Location: [facility];  Service: Cardiovascular;  Laterality: N/A;    HEMATOLOGY/ONCOLOGY HISTORY:  Oncology History Overview Note  Samuel Hood. is a  69 y.o.  male with PMH listed below was seen in consultation at the request of  Pollak, Adriana M, PA-C  for evaluation of lung nodule in the liver lesion. Patient has a past medical history of hypertension, alcohol abuse, smoking emphysema.  He has had serial CTs done in the past.  CT images were independently reviewed by me. 07/26/2017 CT chest with contrast showed extensive pleural-parenchymal scarring within the left upper lobe with several areas of soft tissue nodularity measuring up to 1.6 cm.  In this patient who is at increased risk of lung cancer further investigation with PET scan is recommended. Small chronic appearance loculated hydropneumothorax overlies the left apex. Slowly enlarging lesion within the central portion of the right lobe of liver identified. Per note, patient supposed to have additional work-up done in December repeat CT scan which he did not   Patient was recently seen by primary care provider and has subsequent image done for follow-up. CT on 06/30/2018 showed multiple significantly enlarged paraspinal mass are noted concerning.  The largest measures 3.8 x 0.5 cm and there appears to be a lytic destruction of the adjacent T11 vertebral body.  Also noted is new solid density measuring 17 x 12 mm in the pleural parenchymal density in the left upper lobe noted on prior exam.  Concerning for malignancy.  4.4 ascending thoracic aortic aneurysm.  Recommend annual imaging.   Patient had PET scan done  on 07/06/2018 Which showed there are 2 FDG avid lesion within the left upper lobe worrisome for primary lung neoplasm.  There is evidence of chest wall involvement.  Metastasis to the posterior mediastinum is identified with extension into T11 vertebra and possible involvement of the left T12 neural foramina.   Patient reports left shoulder pain.  Smoking half a pack a day not motivated with further questioning quitting Denies weight loss, hemoptysis, cough.  Chronic shortness of breath with exertion. He drinks alcohol daily. Lives with his girlfriend.  He has 5 adult kids   #Hepatitis C   # 6/1?2020  CT-guided biopsy of left-sided posterior mediastinal soft tissue adjacent to T10/11. Patient's case was discussed on tumor board. Consensus was reached that [facility] versus primary lung cancer with hepatoid adenocarcinoma features. Consensus was to proceed with liver biopsy first as liver mass was not FDG avid on PET scan.  Patient underwent liver biopsy and the present to discuss pathology reports.   ## 08/08/2018 Liver mass biopsy showed fragments of necrotic and calcified tissue with adjacent fibrous capsule Scant viable nonneoplastic liver tissue with steatosis, inflammation and non specific fibrosis.  # 08/28/2018 CT guided left upper lobe lung mass biopsy showed poorly differentiated adenocarcinoma of lung origin.  6/19-08/16/2018  Finished palliative radiation to paraspinal soft tissue mass.   Hepatocellular carcinoma metastatic to left lung ([place])  07/24/2018 Initial Diagnosis   Hepatocellular carcinoma metastatic to left lung ([place])   04/16/2019 -  Chemotherapy   The patient had palonosetron (ALOXI) injection 0.25 mg, 0.25 mg, Intravenous,  Once, 1 of 6 cycles CARBOplatin (PARAPLATIN) 470 mg in sodium chloride 0.9 % 250 mL chemo infusion, 470 mg (100 % of original dose 465.5 mg), Intravenous,  Once, 1 of 6 cycles Dose modification:   (original dose 465.5 mg, Cycle 1) fosaprepitant (EMEND) 150 mg in  sodium chloride 0.9 % 145 mL IVPB, 150 mg, Intravenous,  Once, 1 of 6 cycles PACLitaxel (TAXOL) 354 mg in sodium chloride 0.9 % 500 mL chemo infusion (> '80mg'$ /m2), 200 mg/m2 = 354 mg, Intravenous,  Once, 1 of 6 cycles atezolizumab (TECENTRIQ) 1,200 mg in sodium chloride 0.9 % 250 mL chemo infusion, 1,200 mg, Intravenous, Once, 0 of 9 cycles bevacizumab-bvzr (ZIRABEV) 1,000 mg in sodium chloride 0.9 % 100 mL chemo infusion, 15 mg/kg = 1,000 mg, Intravenous,  Once, 0 of 9 cycles  for chemotherapy treatment.    Stage IV adenocarcinoma of lung, left ([place])  09/02/2018 Initial Diagnosis   Primary lung adenocarcinoma, left ([place])   04/16/2019 -  Chemotherapy   The patient had palonosetron (ALOXI) injection 0.25 mg, 0.25 mg, Intravenous,  Once, 1 of 6 cycles CARBOplatin (PARAPLATIN) 470 mg in sodium chloride 0.9 % 250 mL chemo infusion, 470 mg (100 % of original dose 465.5 mg), Intravenous,  Once, 1 of 6 cycles Dose modification:   (original dose 465.5 mg, Cycle 1) fosaprepitant (EMEND) 150 mg in sodium chloride 0.9 % 145 mL IVPB, 150 mg, Intravenous,  Once, 1 of 6 cycles PACLitaxel (TAXOL) 354 mg in sodium chloride 0.9 % 500 mL chemo infusion (> '80mg'$ /m2),  200 mg/m2 = 354 mg, Intravenous,  Once, 1 of 6 cycles atezolizumab (TECENTRIQ) 1,200 mg in sodium chloride 0.9 % 250 mL chemo infusion, 1,200 mg, Intravenous, Once, 0 of 9 cycles bevacizumab-bvzr (ZIRABEV) 1,000 mg in sodium chloride 0.9 % 100 mL chemo infusion, 15 mg/kg = 1,000 mg, Intravenous,  Once, 0 of 9 cycles  for chemotherapy treatment.    Cancer, metastatic to bone ([place])  12/16/2018 Initial Diagnosis   Cancer, metastatic to bone ([place])   04/16/2019 -  Chemotherapy   The patient had palonosetron (ALOXI) injection 0.25 mg, 0.25 mg, Intravenous,  Once, 1 of 6 cycles CARBOplatin (PARAPLATIN) 470 mg in sodium chloride 0.9 % 250 mL chemo infusion, 470 mg (100 % of original dose 465.5 mg), Intravenous,  Once, 1 of 6 cycles Dose modification:    (original dose 465.5 mg, Cycle 1) fosaprepitant (EMEND) 150 mg in sodium chloride 0.9 % 145 mL IVPB, 150 mg, Intravenous,  Once, 1 of 6 cycles PACLitaxel (TAXOL) 354 mg in sodium chloride 0.9 % 500 mL chemo infusion (> '80mg'$ /m2), 200 mg/m2 = 354 mg, Intravenous,  Once, 1 of 6 cycles atezolizumab (TECENTRIQ) 1,200 mg in sodium chloride 0.9 % 250 mL chemo infusion, 1,200 mg, Intravenous, Once, 0 of 9 cycles bevacizumab-bvzr (ZIRABEV) 1,000 mg in sodium chloride 0.9 % 100 mL chemo infusion, 15 mg/kg = 1,000 mg, Intravenous,  Once, 0 of 9 cycles  for chemotherapy treatment.      ALLERGIES:  is allergic to 5-alpha reductase inhibitors.  MEDICATIONS:  Current Outpatient Medications  Medication Sig Dispense Refill  . amLODipine (NORVASC) 5 MG tablet Take 1 tablet (5 mg total) by mouth daily. 30 tablet 0  . dexamethasone (DECADRON) 4 MG tablet Take 2 tablets (8 mg total) by mouth daily. Start the day after carboplatin chemotherapy for 3 days. (Patient not taking: Reported on 04/13/2019) 30 tablet 1  . fentaNYL (DURAGESIC) 12 MCG/HR Place 1 patch onto the skin every 3 (three) days. 10 patch 0  . lidocaine-prilocaine (EMLA) cream Apply to affected area once (Patient taking differently: Apply 1 application topically daily as needed (port acess). ) 30 g 3  . loperamide (IMODIUM) 2 MG capsule Take 1 capsule (2 mg total) by mouth See admin instructions. With onset of loose stool, take '4mg'$  followed by '2mg'$  every 2 hours until 12 hours have passed without loose bowel movement. Maximum: 16 mg/day 60 capsule 1  . Multiple Vitamin (MULTIVITAMIN) capsule Take 1 capsule by mouth daily.    Marland Kitchen NARCAN 4 MG/0.1ML LIQD nasal spray kit 1 spray once.    . ondansetron (ZOFRAN) 8 MG tablet Take 1 tablet (8 mg total) by mouth 2 (two) times daily as needed for refractory nausea / vomiting. Start on day 3 after carboplatin chemo. (Patient not taking: Reported on 04/13/2019) 30 tablet 1  . oxyCODONE (ROXICODONE) 5 MG immediate  release tablet Take 1 tablet (5 mg total) by mouth every 4 (four) hours as needed for severe pain or breakthrough pain. 60 tablet 0  . polyethylene glycol (MIRALAX) 17 g packet Take 17 g by mouth daily. 21 each 0  . potassium chloride SA (KLOR-CON) 20 MEQ tablet Take 1 tablet (20 mEq total) by mouth daily. 7 tablet 0  . prochlorperazine (COMPAZINE) 10 MG tablet Take 1 tablet (10 mg total) by mouth every 6 (six) hours as needed (Nausea or vomiting). 30 tablet 1  . senna (SENOKOT) 8.6 MG TABS tablet Take 1 tablet (8.6 mg total) by mouth daily. (Patient taking differently:  Take 1 tablet by mouth daily as needed for mild constipation. ) 120 tablet 0   No current facility-administered medications for this visit.   Facility-Administered Medications Ordered in Other Visits  Medication Dose Route Frequency Provider Last Rate Last Admin  . CARBOplatin (PARAPLATIN) 470 mg in sodium chloride 0.9 % 250 mL chemo infusion  470 mg Intravenous Once Earlie Server, MD      . famotidine (PEPCID) IVPB 20 mg premix  20 mg Intravenous Once Earlie Server, MD 200 mL/hr at 04/16/19 1219 20 mg at 04/16/19 1219  . heparin lock flush 100 unit/mL  500 Units Intracatheter Once PRN Earlie Server, MD      . PACLitaxel (TAXOL) 354 mg in sodium chloride 0.9 % 500 mL chemo infusion (> '80mg'$ /m2)  200 mg/m2 (Treatment Plan Recorded) Intravenous Once Earlie Server, MD        VITAL SIGNS: There were no vitals taken for this visit. There were no vitals filed for this visit.  Estimated body mass index is 24.28 kg/m as calculated from the following:   Height as of 04/10/19: '5\' 5"'$  (1.651 m).   Weight as of an earlier encounter on 04/16/19: 145 lb 14.4 oz (66.2 kg).  LABS: CBC:    Component Value Date/Time   WBC 6.0 04/16/2019 0806   HGB 14.3 04/16/2019 0806   HGB 14.6 06/23/2018 1100   HCT 41.9 04/16/2019 0806   HCT 43.2 06/23/2018 1100   PLT 361 04/16/2019 0806   PLT 297 06/23/2018 1100   MCV 90.1 04/16/2019 0806   MCV 96 06/23/2018 1100   MCV  88 09/14/2013 0511   NEUTROABS 3.8 04/16/2019 0806   NEUTROABS 2.8 06/23/2018 1100   NEUTROABS 12.4 (H) 09/14/2013 0511   LYMPHSABS 1.2 04/16/2019 0806   LYMPHSABS 1.8 06/23/2018 1100   LYMPHSABS 2.3 09/14/2013 0511   MONOABS 0.7 04/16/2019 0806   MONOABS 1.1 (H) 09/14/2013 0511   EOSABS 0.3 04/16/2019 0806   EOSABS 0.1 06/23/2018 1100   EOSABS 0.2 09/14/2013 0511   BASOSABS 0.0 04/16/2019 0806   BASOSABS 0.0 06/23/2018 1100   BASOSABS 0.0 09/14/2013 0511   Comprehensive Metabolic Panel:    Component Value Date/Time   NA 137 04/16/2019 0806   NA 136 06/23/2018 1100   NA 139 09/13/2013 0408   K 3.0 (L) 04/16/2019 0806   K 3.3 (L) 09/13/2013 0408   CL 102 04/16/2019 0806   CL 105 09/13/2013 0408   CO2 25 04/16/2019 0806   CO2 27 09/13/2013 0408   BUN 10 04/16/2019 0806   BUN 6 (L) 06/23/2018 1100   BUN 4 (L) 09/13/2013 0408   CREATININE 0.76 04/16/2019 0806   CREATININE 0.67 09/13/2013 0408   GLUCOSE 102 (H) 04/16/2019 0806   GLUCOSE 108 (H) 09/13/2013 0408   CALCIUM 8.8 (L) 04/16/2019 0806   CALCIUM 8.1 (L) 09/13/2013 0408   AST 30 04/16/2019 0806   AST 30 09/13/2013 0408   ALT 25 04/16/2019 0806   ALT 27 09/13/2013 0408   ALKPHOS 58 04/16/2019 0806   ALKPHOS 218 (H) 09/13/2013 0408   BILITOT 0.7 04/16/2019 0806   BILITOT 0.5 06/23/2018 1100   BILITOT 0.9 09/13/2013 0408   PROT 7.9 04/16/2019 0806   PROT 7.4 06/23/2018 1100   PROT 6.2 (L) 09/13/2013 0408   ALBUMIN 3.3 (L) 04/16/2019 0806   ALBUMIN 4.0 06/23/2018 1100   ALBUMIN 1.9 (L) 09/13/2013 0408    RADIOGRAPHIC STUDIES: CT Chest W Contrast  Result Date: 03/26/2019 CLINICAL DATA:  Restaging left lung cancer. Hepatocellular carcinoma restaging. EXAM: CT CHEST, ABDOMEN, AND PELVIS WITH CONTRAST TECHNIQUE: Multidetector CT imaging of the chest, abdomen and pelvis was performed following the standard protocol during bolus administration of intravenous contrast. CONTRAST:  139m OMNIPAQUE IOHEXOL 300 MG/ML  SOLN  COMPARISON:  03/24/2019 and 11/29/2018 FINDINGS: CT CHEST FINDINGS Cardiovascular: Stable ascending thoracic aortic aneurysm, 4.2 cm in diameter. Coronary, aortic arch, and branch vessel atherosclerotic vascular disease. Prominence of the azygos vein. Mediastinum/Nodes: No pathologic adenopathy identified. Lungs/Pleura: Prominent emphysema. Bilateral airway thickening. Architectural distortion, scarring, and some chronic nodularity in the left upper lobe, much of which is likely therapy related, and not appreciably changed. Musculoskeletal: Stable mild left paraspinal density at the T11 level with residual erosion along the left anterior T11 vertebral body. The soft tissue density adjacent to T11 is difficult to completely separate from the adjacent azygos vein but measures about 1.6 by 1.5 cm on image 48/2, and minimally are erodes the adjacent left lateral cortex of T11. This is roughly stable from 03/24/2019 exam. CT ABDOMEN PELVIS FINDINGS Hepatobiliary: Sharply defined segment 8 mass with mild internal heterogeneity measures 3.1 by 2.9 cm, previously 3.2 by 2.9 cm. On the prior exam was query whether this lesion occluding part of the hepatic vein, certainly the lesion is closely associated with the right hepatic vein although definite occlusion is not observed. Adjacent rim enhancing lesion in segment 81.5 by 1.5 cm, stable. A segment 6 lesion with central washout and peripheral enhancement measures about 2.2 by 2.4 cm on image 65/2, stable. Ill-defined hypodense lesion posteriorly in the right hepatic lobe approximately 0.7 cm in diameter on image 71/2. Overall the hepatic lesions are unchanged. 1.3 cm gallstone in the gallbladder. The gallbladder is mildly contracted. No biliary dilatation. Pancreas: Unremarkable Spleen: Unremarkable Adrenals/Urinary Tract: Both adrenal glands appear normal. Stable right renal hypodense lesions, likely cysts. Stomach/Bowel: Swirling of the small bowel mesentery in the central  pelvis but without dilated bowel to suggest a significant volvulus. Stable postoperative findings along proximal sigmoid colon margin. Vascular/Lymphatic: Aortoiliac atherosclerotic vascular disease. No pathologic adenopathy identified. Reproductive: Speckled calcifications along the tunica albuginea the penis. Prostate gland unchanged. Other: No supplemental non-categorized findings. Musculoskeletal: Bridging spurring of both sacroiliac joints. Grade 1 anterolisthesis at L5-S1. Lumbar spondylosis and degenerative disc disease causing multilevel impingement most striking at L5-S1. IMPRESSION: 1. Essentially stable appearance of the hepatic metastatic lesions, not unexpected given that prior exam was from 2 days ago. The left paraspinal soft tissue density at T11 is likewise stable. 2. Stable architectural distortion, scarring, and chronic nodularity in the left upper lobe, much of which is likely therapy related. 3. Swirling of the small bowel mesenteric vessels in the pelvis, but no dilated bowel to suggest a significant volvulus. 4. Other imaging findings of potential clinical significance: Airway thickening is present, suggesting bronchitis or reactive airways disease. Stable ascending thoracic aortic aneurysm, 4.2 cm in diameter. Cholelithiasis. Lumbar spondylosis and degenerative disc disease causing multilevel impingement most striking at L5-S1. Bridging spurring of both sacroiliac joints. Aortic Atherosclerosis (ICD10-I70.0) and Emphysema (ICD10-J43.9). Electronically Signed   By: WVan ClinesM.D.   On: 03/26/2019 10:58   CT Angio Chest PE W and/or Wo Contrast  Result Date: 03/24/2019 CLINICAL DATA:  69year old male with chest pain. History of hepatocellular carcinoma/hepatocellular adenocarcinoma with a positive biopsy in the posterior mediastinum. Subsequent biopsy of liver mass was negative. EXAM: CT ANGIOGRAPHY CHEST CT ABDOMEN AND PELVIS WITH CONTRAST TECHNIQUE: Multidetector CT imaging of the  chest  was performed using the standard protocol during bolus administration of intravenous contrast. Multiplanar CT image reconstructions and MIPs were obtained to evaluate the vascular anatomy. Multidetector CT imaging of the abdomen and pelvis was performed using the standard protocol during bolus administration of intravenous contrast. CONTRAST:  40m OMNIPAQUE IOHEXOL 350 MG/ML SOLN COMPARISON:  11/29/2018, CT 06/18/2014, 07/26/2017, 06/30/2018 FINDINGS: CTA CHEST FINDINGS Cardiovascular: Heart: Heart size unchanged. No pericardial fluid/thickening. Calcifications of the left main, left anterior descending, circumflex, right coronary arteries. Aorta: Greatest diameter of the ascending aorta estimated 42 mm which is unchanged from the comparison. Opacification of the aorta it is limited given the timing of the contrast bolus for the pulmonary arteries. Common origin of the innominate artery an the left common carotid artery. Mild atherosclerosis of the aortic arch. Pulmonary arteries: No central, lobar, segmental, or proximal subsegmental filling defects. Pulmonary arteries are attenuated within the left upper lobe secondary to treatment effect. Mediastinum/Nodes: No mediastinal adenopathy. Unremarkable appearance of the thoracic esophagus. Nodularity of the posterior mediastinum is significantly decreased as compared to the pre treatment CT of 06/30/2018. Unremarkable thoracic inlet. Lungs/Pleura: Advanced paraseptal and centrilobular emphysema, including bullous changes at the bilateral lung apices. Redemonstration of treatment changes of the left upper lung, with architectural distortion, scarring, and pleural thickening persisting. Subpleural reticulation at the right lower lobe lung base. No confluent airspace disease. No pleural effusion. Review of the MIP images confirms the above findings. CT ABDOMEN and PELVIS FINDINGS Hepatobiliary: Redemonstration of the heterogeneous mass within the right liver,  segment 8, with well-defined border and a diameter of 2.7 cm, unchanged. Compared to prior CT there is a new satellite nodule extending inferior and lateral to this lesion on image 15 of series 4. Second hepatic lesion measuring 2.1 cm within segment 6 on image 22 of series 4, new. Third a Paddock lesion segment 6 at the tip of the liver, image 27 of series 4 Additionally there is a heterogeneously hypoattenuating/enhancing region of the left liver with what appears to be occlusion of the left hepatic vein (image 14 of series 4. Hyper dense material in the dependent aspect of the gallbladder, without inflammatory changes. Pancreas: Unremarkable Spleen: Unremarkable Adrenals/Urinary Tract: - Right adrenal gland:  Unremarkable - Left adrenal gland: Unremarkable. - Right kidney: No hydronephrosis, nephrolithiasis, inflammation, or ureteral dilation. Rounded lesions on the lateral cortex of the right kidney, most likely benign, unchanged - Left Kidney: No hydronephrosis, nephrolithiasis, inflammation, or ureteral dilation. No focal lesion. - Urinary Bladder: Unremarkable. Stomach/Bowel: - Stomach: Hiatal hernia with otherwise unremarkable stomach - Small bowel: Unremarkable - Appendix: Normal - Colon: Mild stool burden.  No focal inflammatory changes. Vascular/Lymphatic: Atherosclerotic calcifications of the abdominal aorta and bilateral iliac arteries. Proximal femoral arteries remain patent. No pelvic or abdominal adenopathy. Reproductive: Transverse diameter of the prostate measures 41 mm Other: Surgical changes along the midline abdomen with laxity in the midline. Small bowel loops are immediately subjacent to the midline laparotomy site Musculoskeletal: Degenerative changes of the thoracolumbar spine. Vacuum disc phenomenon spans all levels of the lumbar spine. Lytic lesion of T11 with sclerotic border. While this is unchanged in size from the most recent comparison CT, it is new from 2019 and in the region of the  pathologically proven tumor of the posterior mediastinum. Review of the MIP images confirms the above findings. IMPRESSION: CT negative for pulmonary emboli, with no acute finding to account for chest pain. Progression of hepatic malignancy, with at least 3 new hepatic lesions, separate from the  previously biopsied lesion of the right liver. In addition there is questionable new infiltrative disease of the left liver which appears to occlude the left hepatic vein. Referral for follow-up with oncology is indicated, as well as consideration of contrast-enhanced liver MRI. Treatment changes of the left upper lobe without evidence of local recurrence/progression. In addition, the T11 malignant lytic lesion and biopsy proven tumor of left posterior mediastinal/paravertebral T11 level are unchanged. Advanced emphysema.  Emphysema (ICD10-J43.9). Aortic atherosclerosis and iliac arterial disease. Aortic Atherosclerosis (ICD10-I70.0). Unchanged diameter of ascending aorta, 4.2 cm. Recommend annual imaging followup by CTA or MRA. This recommendation follows 2010 ACCF/AHA/AATS/ACR/ASA/SCA/SCAI/SIR/STS/SVM Guidelines for the Diagnosis and Management of Patients with Thoracic Aortic Disease. Circulation. 2010; 121: [id number]. Aortic aneurysm NOS (ICD10-I71.9) Electronically Signed   By: Corrie Mckusick D.O.   On: 03/24/2019 08:53   NM Bone Scan Whole Body  Result Date: 04/14/2019 CLINICAL DATA:  Stage IV lung cancer.  No recent falls or trauma. EXAM: NUCLEAR MEDICINE WHOLE BODY BONE SCAN TECHNIQUE: Whole body anterior and posterior images were obtained approximately 3 hours after intravenous injection of radiopharmaceutical. RADIOPHARMACEUTICALS:  22.9 mCi Technetium-30mMDP IV COMPARISON:  03/26/2019 chest abdomen and pelvic CTs. FINDINGS: Degenerative changes of the left knee. No abnormal uptake within the axial or appendicular skeleton otherwise. Physiologic renal and bladder excretion. IMPRESSION: No findings of osseous  metastasis. Electronically Signed   By: KAbigail MiyamotoM.D.   On: 04/14/2019 09:54   CT Abdomen Pelvis W Contrast  Result Date: 03/26/2019 CLINICAL DATA:  Restaging left lung cancer. Hepatocellular carcinoma restaging. EXAM: CT CHEST, ABDOMEN, AND PELVIS WITH CONTRAST TECHNIQUE: Multidetector CT imaging of the chest, abdomen and pelvis was performed following the standard protocol during bolus administration of intravenous contrast. CONTRAST:  1026mOMNIPAQUE IOHEXOL 300 MG/ML  SOLN COMPARISON:  03/24/2019 and 11/29/2018 FINDINGS: CT CHEST FINDINGS Cardiovascular: Stable ascending thoracic aortic aneurysm, 4.2 cm in diameter. Coronary, aortic arch, and branch vessel atherosclerotic vascular disease. Prominence of the azygos vein. Mediastinum/Nodes: No pathologic adenopathy identified. Lungs/Pleura: Prominent emphysema. Bilateral airway thickening. Architectural distortion, scarring, and some chronic nodularity in the left upper lobe, much of which is likely therapy related, and not appreciably changed. Musculoskeletal: Stable mild left paraspinal density at the T11 level with residual erosion along the left anterior T11 vertebral body. The soft tissue density adjacent to T11 is difficult to completely separate from the adjacent azygos vein but measures about 1.6 by 1.5 cm on image 48/2, and minimally are erodes the adjacent left lateral cortex of T11. This is roughly stable from 03/24/2019 exam. CT ABDOMEN PELVIS FINDINGS Hepatobiliary: Sharply defined segment 8 mass with mild internal heterogeneity measures 3.1 by 2.9 cm, previously 3.2 by 2.9 cm. On the prior exam was query whether this lesion occluding part of the hepatic vein, certainly the lesion is closely associated with the right hepatic vein although definite occlusion is not observed. Adjacent rim enhancing lesion in segment 81.5 by 1.5 cm, stable. A segment 6 lesion with central washout and peripheral enhancement measures about 2.2 by 2.4 cm on image  65/2, stable. Ill-defined hypodense lesion posteriorly in the right hepatic lobe approximately 0.7 cm in diameter on image 71/2. Overall the hepatic lesions are unchanged. 1.3 cm gallstone in the gallbladder. The gallbladder is mildly contracted. No biliary dilatation. Pancreas: Unremarkable Spleen: Unremarkable Adrenals/Urinary Tract: Both adrenal glands appear normal. Stable right renal hypodense lesions, likely cysts. Stomach/Bowel: Swirling of the small bowel mesentery in the central pelvis but without dilated bowel to suggest a  significant volvulus. Stable postoperative findings along proximal sigmoid colon margin. Vascular/Lymphatic: Aortoiliac atherosclerotic vascular disease. No pathologic adenopathy identified. Reproductive: Speckled calcifications along the tunica albuginea the penis. Prostate gland unchanged. Other: No supplemental non-categorized findings. Musculoskeletal: Bridging spurring of both sacroiliac joints. Grade 1 anterolisthesis at L5-S1. Lumbar spondylosis and degenerative disc disease causing multilevel impingement most striking at L5-S1. IMPRESSION: 1. Essentially stable appearance of the hepatic metastatic lesions, not unexpected given that prior exam was from 2 days ago. The left paraspinal soft tissue density at T11 is likewise stable. 2. Stable architectural distortion, scarring, and chronic nodularity in the left upper lobe, much of which is likely therapy related. 3. Swirling of the small bowel mesenteric vessels in the pelvis, but no dilated bowel to suggest a significant volvulus. 4. Other imaging findings of potential clinical significance: Airway thickening is present, suggesting bronchitis or reactive airways disease. Stable ascending thoracic aortic aneurysm, 4.2 cm in diameter. Cholelithiasis. Lumbar spondylosis and degenerative disc disease causing multilevel impingement most striking at L5-S1. Bridging spurring of both sacroiliac joints. Aortic Atherosclerosis (ICD10-I70.0)  and Emphysema (ICD10-J43.9). Electronically Signed   By: Van Clines M.D.   On: 03/26/2019 10:58   CT ABDOMEN PELVIS W CONTRAST  Result Date: 03/24/2019 CLINICAL DATA:  69 year old male with chest pain. History of hepatocellular carcinoma/hepatocellular adenocarcinoma with a positive biopsy in the posterior mediastinum. Subsequent biopsy of liver mass was negative. EXAM: CT ANGIOGRAPHY CHEST CT ABDOMEN AND PELVIS WITH CONTRAST TECHNIQUE: Multidetector CT imaging of the chest was performed using the standard protocol during bolus administration of intravenous contrast. Multiplanar CT image reconstructions and MIPs were obtained to evaluate the vascular anatomy. Multidetector CT imaging of the abdomen and pelvis was performed using the standard protocol during bolus administration of intravenous contrast. CONTRAST:  10m OMNIPAQUE IOHEXOL 350 MG/ML SOLN COMPARISON:  11/29/2018, CT 06/18/2014, 07/26/2017, 06/30/2018 FINDINGS: CTA CHEST FINDINGS Cardiovascular: Heart: Heart size unchanged. No pericardial fluid/thickening. Calcifications of the left main, left anterior descending, circumflex, right coronary arteries. Aorta: Greatest diameter of the ascending aorta estimated 42 mm which is unchanged from the comparison. Opacification of the aorta it is limited given the timing of the contrast bolus for the pulmonary arteries. Common origin of the innominate artery an the left common carotid artery. Mild atherosclerosis of the aortic arch. Pulmonary arteries: No central, lobar, segmental, or proximal subsegmental filling defects. Pulmonary arteries are attenuated within the left upper lobe secondary to treatment effect. Mediastinum/Nodes: No mediastinal adenopathy. Unremarkable appearance of the thoracic esophagus. Nodularity of the posterior mediastinum is significantly decreased as compared to the pre treatment CT of 06/30/2018. Unremarkable thoracic inlet. Lungs/Pleura: Advanced paraseptal and centrilobular  emphysema, including bullous changes at the bilateral lung apices. Redemonstration of treatment changes of the left upper lung, with architectural distortion, scarring, and pleural thickening persisting. Subpleural reticulation at the right lower lobe lung base. No confluent airspace disease. No pleural effusion. Review of the MIP images confirms the above findings. CT ABDOMEN and PELVIS FINDINGS Hepatobiliary: Redemonstration of the heterogeneous mass within the right liver, segment 8, with well-defined border and a diameter of 2.7 cm, unchanged. Compared to prior CT there is a new satellite nodule extending inferior and lateral to this lesion on image 15 of series 4. Second hepatic lesion measuring 2.1 cm within segment 6 on image 22 of series 4, new. Third a Paddock lesion segment 6 at the tip of the liver, image 27 of series 4 Additionally there is a heterogeneously hypoattenuating/enhancing region of the left liver with what  appears to be occlusion of the left hepatic vein (image 14 of series 4. Hyper dense material in the dependent aspect of the gallbladder, without inflammatory changes. Pancreas: Unremarkable Spleen: Unremarkable Adrenals/Urinary Tract: - Right adrenal gland:  Unremarkable - Left adrenal gland: Unremarkable. - Right kidney: No hydronephrosis, nephrolithiasis, inflammation, or ureteral dilation. Rounded lesions on the lateral cortex of the right kidney, most likely benign, unchanged - Left Kidney: No hydronephrosis, nephrolithiasis, inflammation, or ureteral dilation. No focal lesion. - Urinary Bladder: Unremarkable. Stomach/Bowel: - Stomach: Hiatal hernia with otherwise unremarkable stomach - Small bowel: Unremarkable - Appendix: Normal - Colon: Mild stool burden.  No focal inflammatory changes. Vascular/Lymphatic: Atherosclerotic calcifications of the abdominal aorta and bilateral iliac arteries. Proximal femoral arteries remain patent. No pelvic or abdominal adenopathy. Reproductive:  Transverse diameter of the prostate measures 41 mm Other: Surgical changes along the midline abdomen with laxity in the midline. Small bowel loops are immediately subjacent to the midline laparotomy site Musculoskeletal: Degenerative changes of the thoracolumbar spine. Vacuum disc phenomenon spans all levels of the lumbar spine. Lytic lesion of T11 with sclerotic border. While this is unchanged in size from the most recent comparison CT, it is new from 2019 and in the region of the pathologically proven tumor of the posterior mediastinum. Review of the MIP images confirms the above findings. IMPRESSION: CT negative for pulmonary emboli, with no acute finding to account for chest pain. Progression of hepatic malignancy, with at least 3 new hepatic lesions, separate from the previously biopsied lesion of the right liver. In addition there is questionable new infiltrative disease of the left liver which appears to occlude the left hepatic vein. Referral for follow-up with oncology is indicated, as well as consideration of contrast-enhanced liver MRI. Treatment changes of the left upper lobe without evidence of local recurrence/progression. In addition, the T11 malignant lytic lesion and biopsy proven tumor of left posterior mediastinal/paravertebral T11 level are unchanged. Advanced emphysema.  Emphysema (ICD10-J43.9). Aortic atherosclerosis and iliac arterial disease. Aortic Atherosclerosis (ICD10-I70.0). Unchanged diameter of ascending aorta, 4.2 cm. Recommend annual imaging followup by CTA or MRA. This recommendation follows 2010 ACCF/AHA/AATS/ACR/ASA/SCA/SCAI/SIR/STS/SVM Guidelines for the Diagnosis and Management of Patients with Thoracic Aortic Disease. Circulation. 2010; 121: M629-U765. Aortic aneurysm NOS (ICD10-I71.9) Electronically Signed   By: Corrie Mckusick D.O.   On: 03/24/2019 08:53   PERIPHERAL VASCULAR CATHETERIZATION  Result Date: 04/10/2019 See Op Note  US BIOPSY (LIVER)  Result Date:  03/30/2019 INDICATION: History of lung cancer and hepatocellular carcinoma. New right inferior hepatic lesion EXAM: ULTRASOUND GUIDED CORE BIOPSY OF NEW RIGHT INFERIOR HEPATIC LESION MEDICATIONS: 1% LIDOCAINE LOCAL ANESTHESIA/SEDATION: Fentanyl 100 mcg IV; Versed 2.0 mg IV Moderate Sedation Time:  13 MINUTES The patient was continuously monitored during the procedure by the interventional radiology nurse under my direct supervision. PROCEDURE: The procedure, risks, benefits, and alternatives were explained to the patient. Questions regarding the procedure were encouraged and answered. The patient understands and consents to the procedure. The right anterior subcostal area was prepped with ChloraPrep in a sterile fashion, and a sterile drape was applied covering the operative field. A sterile gown and sterile gloves were used for the procedure. Local anesthesia was provided with 1% Lidocaine. previous imaging reviewed. Preliminary ultrasound performed of the right hepatic lobe from a right upper quadrant subcostal approach. The right inferior hepatic lesion was localized and marked. Under sterile conditions and local anesthesia, a 17 gauge coaxial guide was advanced to the lesion. Images obtained for documentation. 2 18 gauge core biopsies  obtained. Samples were intact and non fragmented. These were placed in formalin. Needle removed. Needle tract occluded with Gel-Foam. Postprocedure imaging demonstrates no hemorrhage or hematoma. Patient tolerated biopsy well. COMPLICATIONS: None immediate. FINDINGS: Imaging confirms needle placement in the right inferior hepatic lesion for core biopsy IMPRESSION: Successful ultrasound right inferior hepatic lesion 18 gauge core biopsies Electronically Signed   By: Jerilynn Mages.  Shick M.D.   On: 03/30/2019 10:34   DG Chest Port 1 View  Result Date: 03/24/2019 CLINICAL DATA:  Acute chest pain. Patient with LEFT lung cancer. EXAM: PORTABLE CHEST 1 VIEW COMPARISON:  08/01/2017 FINDINGS:  Cardiomediastinal silhouette is unchanged. Scarring within the UPPER LEFT lung again noted. Emphysema again identified. There is no evidence of focal airspace disease, pulmonary edema, suspicious pulmonary nodule/mass, pleural effusion, or pneumothorax. No acute bony abnormalities are identified. IMPRESSION: No evidence of acute cardiopulmonary disease. Electronically Signed   By: Margarette Canada M.D.   On: 03/24/2019 07:39    PERFORMANCE STATUS (ECOG) : 1 - Symptomatic but completely ambulatory  Review of Systems Unless otherwise noted, a complete review of systems is negative.  Physical Exam General: NAD Pulmonary: Unlabored Extremities: no edema, no joint deformities Skin: no rashes Neurological: Weakness but otherwise nonfocal  IMPRESSION: Routine follow-up visit.  Patient was seen in infusion area.  Patient reports that the oxycodone has been effective at controlling his pain.  He often takes it and pain will reduce from a 5-6 out of 10 to 0.  However, he continues to require oxycodone around-the-clock.  Patient cannot tell much of a difference after initiation of the transdermal fentanyl.  He did see Dr. Tasia Catchings this morning and the fentanyl was increased to 25 mcg every 72 hours.  Patient denies adverse effects from medications.  He reports tolerating well.  No other symptomatic complaints today.  We did discuss advance care planning.  He does have ACP documents on file.  We discussed completing a MOST form.  Patient says that he previously filled one out and has it at home.  Requested that he bring it into the clinic with him when he returns.  PLAN: -Continue current prescription treatment -Agree with increasing fentanyl to 25 mcg every 72 hours.  Patient may use two of the 12-1/2 mcg patches for now. -Continue oxycodone 5 mg every 4 6 hours as needed for breakthrough pain -Prophylactic bowel regimen -Patient to bring in his MOST form -Follow-up telephone visit 2 weeks   Patient  expressed understanding and was in agreement with this plan. He also understands that He can call the clinic at any time with any questions, concerns, or complaints.     Time Total: 20 minutes  Visit consisted of counseling and education dealing with the complex and emotionally intense issues of symptom management and palliative care in the setting of serious and potentially life-threatening illness.Greater than 50%  of this time was spent counseling and coordinating care related to the above assessment and plan.  Signed by: Altha Harm, PhD, NP-C

## 2019-04-17 ENCOUNTER — Telehealth: Payer: Self-pay

## 2019-04-17 LAB — AFP TUMOR MARKER: AFP, Serum, Tumor Marker: 10.2 ng/mL — ABNORMAL HIGH (ref 0.0–8.3)

## 2019-04-17 LAB — T4: T4, Total: 13.3 ug/dL — ABNORMAL HIGH (ref 4.5–12.0)

## 2019-04-17 NOTE — Telephone Encounter (Signed)
Patient's insurance authorized [employer]. Pt scheduled to recieve treatment tomorrow (3/3). He has been notified and is aware of appt.

## 2019-04-17 NOTE — Telephone Encounter (Signed)
Telephone call to pt for follow up after receiving first infusion yesterday.  Pt c/o not getting out of here until 5:00 pm last night but otherwise chemo went good.  States feeling good today, eating and drinking well.  States bought a cake and been eating it.   Drinking plenty of fluids.   Encouraged pt to call for any questions or concerns.

## 2019-04-17 NOTE — Telephone Encounter (Signed)
05/07/19 appts has been moved to 05/09/19 as requested.

## 2019-04-18 ENCOUNTER — Inpatient Hospital Stay: Payer: Medicare Other

## 2019-04-18 ENCOUNTER — Other Ambulatory Visit: Payer: Self-pay

## 2019-04-18 VITALS — BP 119/84 | HR 80 | Temp 96.9°F | Resp 18

## 2019-04-18 DIAGNOSIS — I10 Essential (primary) hypertension: Secondary | ICD-10-CM | POA: Diagnosis not present

## 2019-04-18 DIAGNOSIS — M47816 Spondylosis without myelopathy or radiculopathy, lumbar region: Secondary | ICD-10-CM | POA: Diagnosis not present

## 2019-04-18 DIAGNOSIS — Z5111 Encounter for antineoplastic chemotherapy: Secondary | ICD-10-CM

## 2019-04-18 DIAGNOSIS — K8020 Calculus of gallbladder without cholecystitis without obstruction: Secondary | ICD-10-CM | POA: Diagnosis not present

## 2019-04-18 DIAGNOSIS — Z95828 Presence of other vascular implants and grafts: Secondary | ICD-10-CM

## 2019-04-18 DIAGNOSIS — I712 Thoracic aortic aneurysm, without rupture: Secondary | ICD-10-CM | POA: Diagnosis not present

## 2019-04-18 DIAGNOSIS — J439 Emphysema, unspecified: Secondary | ICD-10-CM | POA: Diagnosis not present

## 2019-04-18 DIAGNOSIS — E871 Hypo-osmolality and hyponatremia: Secondary | ICD-10-CM | POA: Diagnosis not present

## 2019-04-18 DIAGNOSIS — C3492 Malignant neoplasm of unspecified part of left bronchus or lung: Secondary | ICD-10-CM

## 2019-04-18 DIAGNOSIS — I700 Atherosclerosis of aorta: Secondary | ICD-10-CM | POA: Diagnosis not present

## 2019-04-18 DIAGNOSIS — C7951 Secondary malignant neoplasm of bone: Secondary | ICD-10-CM

## 2019-04-18 DIAGNOSIS — J948 Other specified pleural conditions: Secondary | ICD-10-CM | POA: Diagnosis not present

## 2019-04-18 DIAGNOSIS — C220 Liver cell carcinoma: Secondary | ICD-10-CM

## 2019-04-18 DIAGNOSIS — K449 Diaphragmatic hernia without obstruction or gangrene: Secondary | ICD-10-CM | POA: Diagnosis not present

## 2019-04-18 DIAGNOSIS — G893 Neoplasm related pain (acute) (chronic): Secondary | ICD-10-CM | POA: Diagnosis not present

## 2019-04-18 DIAGNOSIS — B1920 Unspecified viral hepatitis C without hepatic coma: Secondary | ICD-10-CM | POA: Diagnosis not present

## 2019-04-18 DIAGNOSIS — Z5112 Encounter for antineoplastic immunotherapy: Secondary | ICD-10-CM | POA: Diagnosis not present

## 2019-04-18 DIAGNOSIS — M25512 Pain in left shoulder: Secondary | ICD-10-CM | POA: Diagnosis not present

## 2019-04-18 DIAGNOSIS — C7802 Secondary malignant neoplasm of left lung: Secondary | ICD-10-CM | POA: Diagnosis not present

## 2019-04-18 MED ORDER — SODIUM CHLORIDE 0.9 % IV SOLN
Freq: Once | INTRAVENOUS | Status: AC
  Filled 2019-04-18: qty 250

## 2019-04-18 MED ORDER — HEPARIN SOD (PORK) LOCK FLUSH 100 UNIT/ML IV SOLN
INTRAVENOUS | Status: AC
  Filled 2019-04-18: qty 5

## 2019-04-18 MED ORDER — SODIUM CHLORIDE 0.9 % IV SOLN
15.0000 mg/kg | Freq: Once | INTRAVENOUS | Status: AC
  Administered 2019-04-18: 12:00:00 1000 mg via INTRAVENOUS
  Filled 2019-04-18: qty 32

## 2019-04-18 MED ORDER — HEPARIN SOD (PORK) LOCK FLUSH 100 UNIT/ML IV SOLN
500.0000 [IU] | Freq: Once | INTRAVENOUS | Status: AC
  Administered 2019-04-18: 13:00:00 500 [IU] via INTRAVENOUS
  Filled 2019-04-18: qty 5

## 2019-04-18 MED ORDER — SODIUM CHLORIDE 0.9 % IV SOLN
1200.0000 mg | Freq: Once | INTRAVENOUS | Status: AC
  Administered 2019-04-18: 1200 mg via INTRAVENOUS
  Filled 2019-04-18: qty 20

## 2019-04-18 MED ORDER — SODIUM CHLORIDE 0.9% FLUSH
10.0000 mL | Freq: Once | INTRAVENOUS | Status: AC
  Administered 2019-04-18: 09:00:00 10 mL via INTRAVENOUS
  Filled 2019-04-18: qty 10

## 2019-04-18 NOTE — Progress Notes (Signed)
Pt tolerated Tecentriq and Zirabev infusion well with no signs of complications or reactions. RN educated pt on the importance of calling the clinic if any complications arise at home. VSS, pt stable for discharge.   Aimee Heldman [employer]

## 2019-04-18 NOTE — Addendum Note (Signed)
Addended by: Earlie Server on: 04/18/2019 09:22 AM   Modules accepted: Orders

## 2019-04-20 NOTE — Addendum Note (Signed)
Addended by: Faythe Casa E on: 04/20/2019 12:45 PM   Modules accepted: Level of Service

## 2019-04-23 ENCOUNTER — Inpatient Hospital Stay: Payer: Medicare Other

## 2019-04-23 ENCOUNTER — Encounter: Payer: Self-pay | Admitting: Oncology

## 2019-04-23 ENCOUNTER — Inpatient Hospital Stay (HOSPITAL_BASED_OUTPATIENT_CLINIC_OR_DEPARTMENT_OTHER): Payer: Medicare Other | Admitting: Oncology

## 2019-04-23 ENCOUNTER — Other Ambulatory Visit: Payer: Self-pay

## 2019-04-23 VITALS — BP 116/69 | HR 98 | Temp 97.4°F | Wt 140.9 lb

## 2019-04-23 DIAGNOSIS — Z5112 Encounter for antineoplastic immunotherapy: Secondary | ICD-10-CM | POA: Diagnosis not present

## 2019-04-23 DIAGNOSIS — Z95828 Presence of other vascular implants and grafts: Secondary | ICD-10-CM

## 2019-04-23 DIAGNOSIS — I10 Essential (primary) hypertension: Secondary | ICD-10-CM | POA: Diagnosis not present

## 2019-04-23 DIAGNOSIS — C220 Liver cell carcinoma: Secondary | ICD-10-CM

## 2019-04-23 DIAGNOSIS — C3492 Malignant neoplasm of unspecified part of left bronchus or lung: Secondary | ICD-10-CM

## 2019-04-23 DIAGNOSIS — I700 Atherosclerosis of aorta: Secondary | ICD-10-CM | POA: Diagnosis not present

## 2019-04-23 DIAGNOSIS — G893 Neoplasm related pain (acute) (chronic): Secondary | ICD-10-CM

## 2019-04-23 DIAGNOSIS — K8020 Calculus of gallbladder without cholecystitis without obstruction: Secondary | ICD-10-CM | POA: Diagnosis not present

## 2019-04-23 DIAGNOSIS — C7802 Secondary malignant neoplasm of left lung: Secondary | ICD-10-CM

## 2019-04-23 DIAGNOSIS — M899 Disorder of bone, unspecified: Secondary | ICD-10-CM | POA: Diagnosis not present

## 2019-04-23 DIAGNOSIS — Z5111 Encounter for antineoplastic chemotherapy: Secondary | ICD-10-CM

## 2019-04-23 DIAGNOSIS — B1920 Unspecified viral hepatitis C without hepatic coma: Secondary | ICD-10-CM | POA: Diagnosis not present

## 2019-04-23 DIAGNOSIS — J439 Emphysema, unspecified: Secondary | ICD-10-CM | POA: Diagnosis not present

## 2019-04-23 DIAGNOSIS — K449 Diaphragmatic hernia without obstruction or gangrene: Secondary | ICD-10-CM | POA: Diagnosis not present

## 2019-04-23 DIAGNOSIS — C7951 Secondary malignant neoplasm of bone: Secondary | ICD-10-CM

## 2019-04-23 DIAGNOSIS — M25512 Pain in left shoulder: Secondary | ICD-10-CM | POA: Diagnosis not present

## 2019-04-23 DIAGNOSIS — E871 Hypo-osmolality and hyponatremia: Secondary | ICD-10-CM | POA: Diagnosis not present

## 2019-04-23 DIAGNOSIS — J948 Other specified pleural conditions: Secondary | ICD-10-CM | POA: Diagnosis not present

## 2019-04-23 DIAGNOSIS — M47816 Spondylosis without myelopathy or radiculopathy, lumbar region: Secondary | ICD-10-CM | POA: Diagnosis not present

## 2019-04-23 DIAGNOSIS — I712 Thoracic aortic aneurysm, without rupture: Secondary | ICD-10-CM | POA: Diagnosis not present

## 2019-04-23 LAB — COMPREHENSIVE METABOLIC PANEL
ALT: 58 U/L — ABNORMAL HIGH (ref 0–44)
AST: 52 U/L — ABNORMAL HIGH (ref 15–41)
Albumin: 3.2 g/dL — ABNORMAL LOW (ref 3.5–5.0)
Alkaline Phosphatase: 64 U/L (ref 38–126)
Anion gap: 10 (ref 5–15)
BUN: 10 mg/dL (ref 8–23)
CO2: 24 mmol/L (ref 22–32)
Calcium: 8.9 mg/dL (ref 8.9–10.3)
Chloride: 100 mmol/L (ref 98–111)
Creatinine, Ser: 0.53 mg/dL — ABNORMAL LOW (ref 0.61–1.24)
GFR calc Af Amer: >60 mL/min (ref >60)
GFR calc non Af Amer: >60 mL/min (ref >60)
Glucose, Bld: 102 mg/dL — ABNORMAL HIGH (ref 70–99)
Potassium: 3.5 mmol/L (ref 3.5–5.1)
Sodium: 134 mmol/L — ABNORMAL LOW (ref 135–145)
Total Bilirubin: 0.5 mg/dL (ref 0.3–1.2)
Total Protein: 7.4 g/dL (ref 6.5–8.1)

## 2019-04-23 LAB — CBC WITH DIFFERENTIAL/PLATELET
Abs Immature Granulocytes: 0.02 10*3/uL (ref 0.00–0.07)
Basophils Absolute: 0 10*3/uL (ref 0.0–0.1)
Basophils Relative: 0 %
Eosinophils Absolute: 0.1 10*3/uL (ref 0.0–0.5)
Eosinophils Relative: 4 %
HCT: 39.8 % (ref 39.0–52.0)
Hemoglobin: 13.2 g/dL (ref 13.0–17.0)
Immature Granulocytes: 1 %
Lymphocytes Relative: 22 %
Lymphs Abs: 0.6 10*3/uL — ABNORMAL LOW (ref 0.7–4.0)
MCH: 30.3 pg (ref 26.0–34.0)
MCHC: 33.2 g/dL (ref 30.0–36.0)
MCV: 91.3 fL (ref 80.0–100.0)
Monocytes Absolute: 0.1 10*3/uL (ref 0.1–1.0)
Monocytes Relative: 3 %
Neutro Abs: 1.9 10*3/uL (ref 1.7–7.7)
Neutrophils Relative %: 70 %
Platelets: 266 10*3/uL (ref 150–400)
RBC: 4.36 MIL/uL (ref 4.22–5.81)
RDW: 13.7 % (ref 11.5–15.5)
WBC: 2.7 10*3/uL — ABNORMAL LOW (ref 4.0–10.5)
nRBC: 0 % (ref 0.0–0.2)

## 2019-04-23 MED ORDER — HEPARIN SOD (PORK) LOCK FLUSH 100 UNIT/ML IV SOLN
500.0000 [IU] | Freq: Once | INTRAVENOUS | Status: AC
  Administered 2019-04-23: 500 [IU] via INTRAVENOUS
  Filled 2019-04-23: qty 5

## 2019-04-23 MED ORDER — SODIUM CHLORIDE 0.9% FLUSH
10.0000 mL | Freq: Once | INTRAVENOUS | Status: AC
  Administered 2019-04-23: 10 mL via INTRAVENOUS
  Filled 2019-04-23: qty 10

## 2019-04-23 MED ORDER — HEPARIN SOD (PORK) LOCK FLUSH 100 UNIT/ML IV SOLN
INTRAVENOUS | Status: AC
  Filled 2019-04-23: qty 5

## 2019-04-23 NOTE — Progress Notes (Signed)
Hematology/Oncology follow up note [facility] [phone] [phone]   Patient Care Team: Paulene Floor as PCP - General (Physician Assistant) Telford Nab, RN as Registered Nurse Clent Jacks, RN as Registered Nurse  REFERRING PROVIDER: Trinna Post, PA-C  CHIEF COMPLAINTS/REASON FOR VISIT:  Discussion of metastatic cancer management.  HISTORY OF PRESENTING ILLNESS:   Samuel Hood. is a  69 y.o.  male with PMH listed below was seen in consultation at the request of  Terrilee Croak, Adriana M, PA-C  for evaluation of lung nodule in the liver lesion. Patient has a past medical history of hypertension, alcohol abuse, smoking emphysema.  He has had serial CTs done in the past.  CT images were independently reviewed by me. 07/26/2017 CT chest with contrast showed extensive pleural-parenchymal scarring within the left upper lobe with several areas of soft tissue nodularity measuring up to 1.6 cm.  In this patient who is at increased risk of lung cancer further investigation with PET scan is recommended. Small chronic appearance loculated hydropneumothorax overlies the left apex. Slowly enlarging lesion within the central portion of the right lobe of liver identified. Per note, patient supposed to have additional work-up done in December repeat CT scan which he did not  Patient was recently seen by primary care provider and has subsequent image done for follow-up. CT on 06/30/2018 showed multiple significantly enlarged paraspinal mass are noted concerning.  The largest measures 3.8 x 0.5 cm and there appears to be a lytic destruction of the adjacent T11 vertebral body.  Also noted is new solid density measuring 17 x 12 mm in the pleural parenchymal density in the left upper lobe noted on prior exam.  Concerning for malignancy.  4.4 ascending thoracic aortic aneurysm.  Recommend annual imaging.  Patient had PET scan done on  07/06/2018 Which showed there are 2 FDG avid lesion within the left upper lobe worrisome for primary lung neoplasm.  There is evidence of chest wall involvement.  Metastasis to the posterior mediastinum is identified with extension into T11 vertebra and possible involvement of the left T12 neural foramina.  Patient reports left shoulder pain.  Smoking half a pack a day not motivated with further questioning quitting Denies weight loss, hemoptysis, cough.  Chronic shortness of breath with exertion. He drinks alcohol daily. Lives with his girlfriend.  He has 5 adult kids  #Hepatitis C  # 6/1?2020  CT-guided biopsy of left-sided posterior mediastinal soft tissue adjacent to T10/11. Patient's case was discussed on tumor board. Consensus was reached that [facility] versus primary lung cancer with hepatoid adenocarcinoma features. Consensus was to proceed with liver biopsy first as liver mass was not FDG avid on PET scan.  Patient underwent liver biopsy and the present to discuss pathology reports.  ## 08/08/2018 Liver mass biopsy showed fragments of necrotic and calcified tissue with adjacent fibrous capsule Scant viable nonneoplastic liver tissue with steatosis, inflammation and non specific fibrosis.  # 08/28/2018 CT guided left upper lobe lung mass biopsy showed poorly differentiated adenocarcinoma of lung origin.  6/19-08/16/2018  Finished palliative radiation to paraspinal soft tissue mass. 09/07/2018 patient was started on lenvatinib 12 mg daily. 10/13/2018 SBRT to lung cancer lesion.  #Patient is on Zometa monthly #He was advised to take calcium supplement.  He reports he quit taking calcium as he broke out rash on her scalp, neck and chest after taking calcium. # 03/30/19 liver lesion biopsy showed poorly differentiated adenocarcinoma, compatible with lung primary.  INTERVAL HISTORY Samuel Hood. is a 69 y.o. male who has above history reviewed by me today presents for follow up visit for  assessment of prior to chemotherapy for the treatment of [place] and lung cancer. Patient was on fentanyl patch 25 MCG per hour every 72 hours, and oxycodone 5 mg every 4 hours as needed.   Today he did not use any fentanyl patch.Marland Kitchen  He peeled off the patch yesterday and did not put one on yet. He reports taking oxycodone 5 mg 2-3 times per day.  Pain level has improved. Appetite is good. Has lost 5 pound weight. Status post first cycle of carbo/Taxol/Avastin/Tecentriq. Feels more fatigued.    Review of Systems  Constitutional: Positive for fatigue. Negative for appetite change, chills, fever and unexpected weight change.  HENT:   Negative for hearing loss and voice change.   Eyes: Negative for eye problems and icterus.  Respiratory: Negative for chest tightness, cough and shortness of breath.   Cardiovascular: Negative for chest pain and leg swelling.  Gastrointestinal: Negative for abdominal distention and abdominal pain.  Endocrine: Negative for hot flashes.  Genitourinary: Negative for difficulty urinating, dysuria and frequency.   Musculoskeletal: Negative for arthralgias.  Skin: Negative for itching and rash.  Neurological: Negative for light-headedness and numbness.  Hematological: Negative for adenopathy. Does not bruise/bleed easily.  Psychiatric/Behavioral: Negative for confusion.    MEDICAL HISTORY:  Past Medical History:  Diagnosis Date  . Hepatocellular carcinoma metastatic to left lung ([place]) 07/24/2018  . History of angiography    left lower extremity  . Hypertension   . Small bowel obstruction ([place])     SURGICAL HISTORY: Past Surgical History:  Procedure Laterality Date  . COLON SURGERY    . COLONOSCOPY W/ POLYPECTOMY    . COLONOSCOPY WITH PROPOFOL N/A 01/31/2018   Procedure: COLONOSCOPY WITH PROPOFOL;  Surgeon: Toledo, Benay Pike, MD;  Location: [facility] ENDOSCOPY;  Service: Gastroenterology;  Laterality: N/A;  . debridement fasciotomy leg, left Left 03/19/2016  .  LAPAROTOMY N/A 09/27/2014   Procedure: EXPLORATORY LAPAROTOMY;  Surgeon: Dia Crawford III, MD;  Location: [facility] ORS;  Service: General;  Laterality: N/A;  . PORTA CATH INSERTION N/A 04/10/2019   Procedure: PORTA CATH INSERTION;  Surgeon: Katha Cabal, MD;  Location: [facility];  Service: Cardiovascular;  Laterality: N/A;    SOCIAL HISTORY: Social History   Socioeconomic History  . Marital status: Single    Spouse name: Not on file  . Number of children: Not on file  . Years of education: Not on file  . Highest education level: Not on file  Occupational History  . Occupation: retired  Tobacco Use  . Smoking status: Former Smoker    Packs/day: 0.25    Years: 50.00    Pack years: 12.50  . Smokeless tobacco: Never Used  . Tobacco comment: 1-2/week  Substance and Sexual Activity  . Alcohol use: Not Currently    Alcohol/week: 35.0 standard drinks    Types: 30 Cans of beer, 5 Shots of liquor per week  . Drug use: No  . Sexual activity: Not on file  Other Topics Concern  . Not on file  Social History Narrative  . Not on file   Social Determinants of Health   Financial Resource Strain: High Risk  . Difficulty of Paying Living Expenses: Hard  Food Insecurity: No Food Insecurity  . Worried About Charity fundraiser in the Last Year: Never true  . Ran Out of Food  in the Last Year: Never true  Transportation Needs: No Transportation Needs  . Lack of Transportation (Medical): No  . Lack of Transportation (Non-Medical): No  Physical Activity: Sufficiently Active  . Days of Exercise per Week: 7 days  . Minutes of Exercise per Session: 30 min  Stress: No Stress Concern Present  . Feeling of Stress : Not at all  Social Connections: Unknown  . Frequency of Communication with Friends and Family: More than three times a week  . Frequency of Social Gatherings with Friends and Family: More than three times a week  . Attends Religious Services: More than 4 times per year  .  Active Member of Clubs or Organizations: Not on file  . Attends Archivist Meetings: Not on file  . Marital Status: Not on file  Intimate Partner Violence: Unknown  . Fear of Current or Ex-Partner: Patient refused  . Emotionally Abused: No  . Physically Abused: No  . Sexually Abused: No    FAMILY HISTORY: Family History  Problem Relation Age of Onset  . Cancer Mother   . Cancer Father     ALLERGIES:  is allergic to 5-alpha reductase inhibitors.  MEDICATIONS:  Current Outpatient Medications  Medication Sig Dispense Refill  . amLODipine (NORVASC) 5 MG tablet Take 1 tablet (5 mg total) by mouth daily. 30 tablet 0  . fentaNYL (DURAGESIC) 12 MCG/HR Place 1 patch onto the skin every 3 (three) days. (Patient taking differently: Place 2 patches onto the skin every 3 (three) days. ) 10 patch 0  . lidocaine-prilocaine (EMLA) cream Apply to affected area once (Patient taking differently: Apply 1 application topically daily as needed (port acess). ) 30 g 3  . Multiple Vitamin (MULTIVITAMIN) capsule Take 1 capsule by mouth daily.    Marland Kitchen oxyCODONE (ROXICODONE) 5 MG immediate release tablet Take 1 tablet (5 mg total) by mouth every 4 (four) hours as needed for severe pain or breakthrough pain. 60 tablet 0  . polyethylene glycol (MIRALAX) 17 g packet Take 17 g by mouth daily. 21 each 0  . senna (SENOKOT) 8.6 MG TABS tablet Take 1 tablet (8.6 mg total) by mouth daily. (Patient taking differently: Take 1 tablet by mouth daily as needed for mild constipation. ) 120 tablet 0  . dexamethasone (DECADRON) 4 MG tablet Take 2 tablets (8 mg total) by mouth daily. Start the day after carboplatin chemotherapy for 3 days. (Patient not taking: Reported on 04/13/2019) 30 tablet 1  . loperamide (IMODIUM) 2 MG capsule Take 1 capsule (2 mg total) by mouth See admin instructions. With onset of loose stool, take '4mg'$  followed by '2mg'$  every 2 hours until 12 hours have passed without loose bowel movement. Maximum: 16  mg/day (Patient not taking: Reported on 04/23/2019) 60 capsule 1  . NARCAN 4 MG/0.1ML LIQD nasal spray kit 1 spray once.    . ondansetron (ZOFRAN) 8 MG tablet Take 1 tablet (8 mg total) by mouth 2 (two) times daily as needed for refractory nausea / vomiting. Start on day 3 after carboplatin chemo. (Patient not taking: Reported on 04/13/2019) 30 tablet 1  . potassium chloride SA (KLOR-CON) 20 MEQ tablet Take 1 tablet (20 mEq total) by mouth daily. (Patient not taking: Reported on 04/23/2019) 7 tablet 0  . prochlorperazine (COMPAZINE) 10 MG tablet Take 1 tablet (10 mg total) by mouth every 6 (six) hours as needed (Nausea or vomiting). (Patient not taking: Reported on 04/23/2019) 30 tablet 1   No current facility-administered medications for this  visit.     PHYSICAL EXAMINATION: ECOG PERFORMANCE STATUS: 1 - Symptomatic but completely ambulatory Vitals:   04/23/19 1343  BP: 116/69  Pulse: 98  Temp: (!) 97.4 F (36.3 C)   Filed Weights   04/23/19 1343  Weight: 140 lb 14.4 oz (63.9 kg)    Physical Exam Constitutional:      General: He is not in acute distress.    Comments: Thin built, walk independently  HENT:     Head: Normocephalic and atraumatic.  Eyes:     General: No scleral icterus.    Pupils: Pupils are equal, round, and reactive to light.  Cardiovascular:     Rate and Rhythm: Normal rate and regular rhythm.     Heart sounds: Normal heart sounds.  Pulmonary:     Effort: Pulmonary effort is normal. No respiratory distress.     Breath sounds: No wheezing.     Comments: Decreased breath sound bilaterally. Abdominal:     General: Bowel sounds are normal. There is no distension.     Palpations: Abdomen is soft. There is no mass.     Tenderness: There is no abdominal tenderness.  Musculoskeletal:        General: No deformity. Normal range of motion.     Cervical back: Normal range of motion and neck supple.     Comments: Palpable left anterior chest wall/breast tenderness,  improved.   Skin:    General: Skin is warm and dry.     Findings: No erythema or rash.  Neurological:     Mental Status: He is alert and oriented to person, place, and time. Mental status is at baseline.     Cranial Nerves: No cranial nerve deficit.     Coordination: Coordination normal.  Psychiatric:        Mood and Affect: Mood normal.        Behavior: Behavior normal.        Thought Content: Thought content normal.     LABORATORY DATA:  I have reviewed the data as listed Lab Results  Component Value Date   WBC 2.7 (L) 04/23/2019   HGB 13.2 04/23/2019   HCT 39.8 04/23/2019   MCV 91.3 04/23/2019   PLT 266 04/23/2019   Recent Labs    04/06/19 1308 04/16/19 0806 04/23/19 1315  NA 138 137 134*  K 3.8 3.0* 3.5  CL 100 102 100  CO2 '27 25 24  '$ GLUCOSE 87 102* 102*  BUN '8 10 10  '$ CREATININE 0.71 0.76 0.53*  CALCIUM 9.3 8.8* 8.9  GFRNONAA >60 >60 >60  GFRAA >60 >60 >60  PROT 8.1 7.9 7.4  ALBUMIN 3.5 3.3* 3.2*  AST 36 30 52*  ALT 40 25 58*  ALKPHOS 78 58 64  BILITOT 0.6 0.7 0.5   Iron/TIBC/Ferritin/ %Sat No results found for: IRON, TIBC, FERRITIN, IRONPCTSAT   RADIOGRAPHIC STUDIES: I have personally reviewed the radiological images as listed and agreed with the findings in the report. CT Chest W Contrast  Result Date: 03/26/2019 CLINICAL DATA:  Restaging left lung cancer. Hepatocellular carcinoma restaging. EXAM: CT CHEST, ABDOMEN, AND PELVIS WITH CONTRAST TECHNIQUE: Multidetector CT imaging of the chest, abdomen and pelvis was performed following the standard protocol during bolus administration of intravenous contrast. CONTRAST:  15m OMNIPAQUE IOHEXOL 300 MG/ML  SOLN COMPARISON:  03/24/2019 and 11/29/2018 FINDINGS: CT CHEST FINDINGS Cardiovascular: Stable ascending thoracic aortic aneurysm, 4.2 cm in diameter. Coronary, aortic arch, and branch vessel atherosclerotic vascular disease. Prominence of the azygos vein. Mediastinum/Nodes:  No pathologic adenopathy identified.  Lungs/Pleura: Prominent emphysema. Bilateral airway thickening. Architectural distortion, scarring, and some chronic nodularity in the left upper lobe, much of which is likely therapy related, and not appreciably changed. Musculoskeletal: Stable mild left paraspinal density at the T11 level with residual erosion along the left anterior T11 vertebral body. The soft tissue density adjacent to T11 is difficult to completely separate from the adjacent azygos vein but measures about 1.6 by 1.5 cm on image 48/2, and minimally are erodes the adjacent left lateral cortex of T11. This is roughly stable from 03/24/2019 exam. CT ABDOMEN PELVIS FINDINGS Hepatobiliary: Sharply defined segment 8 mass with mild internal heterogeneity measures 3.1 by 2.9 cm, previously 3.2 by 2.9 cm. On the prior exam was query whether this lesion occluding part of the hepatic vein, certainly the lesion is closely associated with the right hepatic vein although definite occlusion is not observed. Adjacent rim enhancing lesion in segment 81.5 by 1.5 cm, stable. A segment 6 lesion with central washout and peripheral enhancement measures about 2.2 by 2.4 cm on image 65/2, stable. Ill-defined hypodense lesion posteriorly in the right hepatic lobe approximately 0.7 cm in diameter on image 71/2. Overall the hepatic lesions are unchanged. 1.3 cm gallstone in the gallbladder. The gallbladder is mildly contracted. No biliary dilatation. Pancreas: Unremarkable Spleen: Unremarkable Adrenals/Urinary Tract: Both adrenal glands appear normal. Stable right renal hypodense lesions, likely cysts. Stomach/Bowel: Swirling of the small bowel mesentery in the central pelvis but without dilated bowel to suggest a significant volvulus. Stable postoperative findings along proximal sigmoid colon margin. Vascular/Lymphatic: Aortoiliac atherosclerotic vascular disease. No pathologic adenopathy identified. Reproductive: Speckled calcifications along the tunica albuginea the  penis. Prostate gland unchanged. Other: No supplemental non-categorized findings. Musculoskeletal: Bridging spurring of both sacroiliac joints. Grade 1 anterolisthesis at L5-S1. Lumbar spondylosis and degenerative disc disease causing multilevel impingement most striking at L5-S1. IMPRESSION: 1. Essentially stable appearance of the hepatic metastatic lesions, not unexpected given that prior exam was from 2 days ago. The left paraspinal soft tissue density at T11 is likewise stable. 2. Stable architectural distortion, scarring, and chronic nodularity in the left upper lobe, much of which is likely therapy related. 3. Swirling of the small bowel mesenteric vessels in the pelvis, but no dilated bowel to suggest a significant volvulus. 4. Other imaging findings of potential clinical significance: Airway thickening is present, suggesting bronchitis or reactive airways disease. Stable ascending thoracic aortic aneurysm, 4.2 cm in diameter. Cholelithiasis. Lumbar spondylosis and degenerative disc disease causing multilevel impingement most striking at L5-S1. Bridging spurring of both sacroiliac joints. Aortic Atherosclerosis (ICD10-I70.0) and Emphysema (ICD10-J43.9). Electronically Signed   By: Van Clines M.D.   On: 03/26/2019 10:58   CT Angio Chest PE W and/or Wo Contrast  Result Date: 03/24/2019 CLINICAL DATA:  69 year old male with chest pain. History of hepatocellular carcinoma/hepatocellular adenocarcinoma with a positive biopsy in the posterior mediastinum. Subsequent biopsy of liver mass was negative. EXAM: CT ANGIOGRAPHY CHEST CT ABDOMEN AND PELVIS WITH CONTRAST TECHNIQUE: Multidetector CT imaging of the chest was performed using the standard protocol during bolus administration of intravenous contrast. Multiplanar CT image reconstructions and MIPs were obtained to evaluate the vascular anatomy. Multidetector CT imaging of the abdomen and pelvis was performed using the standard protocol during bolus  administration of intravenous contrast. CONTRAST:  18m OMNIPAQUE IOHEXOL 350 MG/ML SOLN COMPARISON:  11/29/2018, CT 06/18/2014, 07/26/2017, 06/30/2018 FINDINGS: CTA CHEST FINDINGS Cardiovascular: Heart: Heart size unchanged. No pericardial fluid/thickening. Calcifications of the left main, left anterior descending,  circumflex, right coronary arteries. Aorta: Greatest diameter of the ascending aorta estimated 42 mm which is unchanged from the comparison. Opacification of the aorta it is limited given the timing of the contrast bolus for the pulmonary arteries. Common origin of the innominate artery an the left common carotid artery. Mild atherosclerosis of the aortic arch. Pulmonary arteries: No central, lobar, segmental, or proximal subsegmental filling defects. Pulmonary arteries are attenuated within the left upper lobe secondary to treatment effect. Mediastinum/Nodes: No mediastinal adenopathy. Unremarkable appearance of the thoracic esophagus. Nodularity of the posterior mediastinum is significantly decreased as compared to the pre treatment CT of 06/30/2018. Unremarkable thoracic inlet. Lungs/Pleura: Advanced paraseptal and centrilobular emphysema, including bullous changes at the bilateral lung apices. Redemonstration of treatment changes of the left upper lung, with architectural distortion, scarring, and pleural thickening persisting. Subpleural reticulation at the right lower lobe lung base. No confluent airspace disease. No pleural effusion. Review of the MIP images confirms the above findings. CT ABDOMEN and PELVIS FINDINGS Hepatobiliary: Redemonstration of the heterogeneous mass within the right liver, segment 8, with well-defined border and a diameter of 2.7 cm, unchanged. Compared to prior CT there is a new satellite nodule extending inferior and lateral to this lesion on image 15 of series 4. Second hepatic lesion measuring 2.1 cm within segment 6 on image 22 of series 4, new. Third a [place] lesion  segment 6 at the tip of the liver, image 27 of series 4 Additionally there is a heterogeneously hypoattenuating/enhancing region of the left liver with what appears to be occlusion of the left hepatic vein (image 14 of series 4. Hyper dense material in the dependent aspect of the gallbladder, without inflammatory changes. Pancreas: Unremarkable Spleen: Unremarkable Adrenals/Urinary Tract: - Right adrenal gland:  Unremarkable - Left adrenal gland: Unremarkable. - Right kidney: No hydronephrosis, nephrolithiasis, inflammation, or ureteral dilation. Rounded lesions on the lateral cortex of the right kidney, most likely benign, unchanged - Left Kidney: No hydronephrosis, nephrolithiasis, inflammation, or ureteral dilation. No focal lesion. - Urinary Bladder: Unremarkable. Stomach/Bowel: - Stomach: Hiatal hernia with otherwise unremarkable stomach - Small bowel: Unremarkable - Appendix: Normal - Colon: Mild stool burden.  No focal inflammatory changes. Vascular/Lymphatic: Atherosclerotic calcifications of the abdominal aorta and bilateral iliac arteries. Proximal femoral arteries remain patent. No pelvic or abdominal adenopathy. Reproductive: Transverse diameter of the prostate measures 41 mm Other: Surgical changes along the midline abdomen with laxity in the midline. Small bowel loops are immediately subjacent to the midline laparotomy site Musculoskeletal: Degenerative changes of the thoracolumbar spine. Vacuum disc phenomenon spans all levels of the lumbar spine. Lytic lesion of T11 with sclerotic border. While this is unchanged in size from the most recent comparison CT, it is new from 2019 and in the region of the pathologically proven tumor of the posterior mediastinum. Review of the MIP images confirms the above findings. IMPRESSION: CT negative for pulmonary emboli, with no acute finding to account for chest pain. Progression of hepatic malignancy, with at least 3 new hepatic lesions, separate from the  previously biopsied lesion of the right liver. In addition there is questionable new infiltrative disease of the left liver which appears to occlude the left hepatic vein. Referral for follow-up with oncology is indicated, as well as consideration of contrast-enhanced liver MRI. Treatment changes of the left upper lobe without evidence of local recurrence/progression. In addition, the T11 malignant lytic lesion and biopsy proven tumor of left posterior mediastinal/paravertebral T11 level are unchanged. Advanced emphysema.  Emphysema (ICD10-J43.9). Aortic atherosclerosis  and iliac arterial disease. Aortic Atherosclerosis (ICD10-I70.0). Unchanged diameter of ascending aorta, 4.2 cm. Recommend annual imaging followup by CTA or MRA. This recommendation follows 2010 ACCF/AHA/AATS/ACR/ASA/SCA/SCAI/SIR/STS/SVM Guidelines for the Diagnosis and Management of Patients with Thoracic Aortic Disease. Circulation. 2010; 121: Z482-L078. Aortic aneurysm NOS (ICD10-I71.9) Electronically Signed   By: Corrie Mckusick D.O.   On: 03/24/2019 08:53   NM Bone Scan Whole Body  Result Date: 04/14/2019 CLINICAL DATA:  Stage IV lung cancer.  No recent falls or trauma. EXAM: NUCLEAR MEDICINE WHOLE BODY BONE SCAN TECHNIQUE: Whole body anterior and posterior images were obtained approximately 3 hours after intravenous injection of radiopharmaceutical. RADIOPHARMACEUTICALS:  22.9 mCi Technetium-60mMDP IV COMPARISON:  03/26/2019 chest abdomen and pelvic CTs. FINDINGS: Degenerative changes of the left knee. No abnormal uptake within the axial or appendicular skeleton otherwise. Physiologic renal and bladder excretion. IMPRESSION: No findings of osseous metastasis. Electronically Signed   By: KAbigail MiyamotoM.D.   On: 04/14/2019 09:54   CT Abdomen Pelvis W Contrast  Result Date: 03/26/2019 CLINICAL DATA:  Restaging left lung cancer. Hepatocellular carcinoma restaging. EXAM: CT CHEST, ABDOMEN, AND PELVIS WITH CONTRAST TECHNIQUE: Multidetector CT  imaging of the chest, abdomen and pelvis was performed following the standard protocol during bolus administration of intravenous contrast. CONTRAST:  1045mOMNIPAQUE IOHEXOL 300 MG/ML  SOLN COMPARISON:  03/24/2019 and 11/29/2018 FINDINGS: CT CHEST FINDINGS Cardiovascular: Stable ascending thoracic aortic aneurysm, 4.2 cm in diameter. Coronary, aortic arch, and branch vessel atherosclerotic vascular disease. Prominence of the azygos vein. Mediastinum/Nodes: No pathologic adenopathy identified. Lungs/Pleura: Prominent emphysema. Bilateral airway thickening. Architectural distortion, scarring, and some chronic nodularity in the left upper lobe, much of which is likely therapy related, and not appreciably changed. Musculoskeletal: Stable mild left paraspinal density at the T11 level with residual erosion along the left anterior T11 vertebral body. The soft tissue density adjacent to T11 is difficult to completely separate from the adjacent azygos vein but measures about 1.6 by 1.5 cm on image 48/2, and minimally are erodes the adjacent left lateral cortex of T11. This is roughly stable from 03/24/2019 exam. CT ABDOMEN PELVIS FINDINGS Hepatobiliary: Sharply defined segment 8 mass with mild internal heterogeneity measures 3.1 by 2.9 cm, previously 3.2 by 2.9 cm. On the prior exam was query whether this lesion occluding part of the hepatic vein, certainly the lesion is closely associated with the right hepatic vein although definite occlusion is not observed. Adjacent rim enhancing lesion in segment 81.5 by 1.5 cm, stable. A segment 6 lesion with central washout and peripheral enhancement measures about 2.2 by 2.4 cm on image 65/2, stable. Ill-defined hypodense lesion posteriorly in the right hepatic lobe approximately 0.7 cm in diameter on image 71/2. Overall the hepatic lesions are unchanged. 1.3 cm gallstone in the gallbladder. The gallbladder is mildly contracted. No biliary dilatation. Pancreas: Unremarkable Spleen:  Unremarkable Adrenals/Urinary Tract: Both adrenal glands appear normal. Stable right renal hypodense lesions, likely cysts. Stomach/Bowel: Swirling of the small bowel mesentery in the central pelvis but without dilated bowel to suggest a significant volvulus. Stable postoperative findings along proximal sigmoid colon margin. Vascular/Lymphatic: Aortoiliac atherosclerotic vascular disease. No pathologic adenopathy identified. Reproductive: Speckled calcifications along the tunica albuginea the penis. Prostate gland unchanged. Other: No supplemental non-categorized findings. Musculoskeletal: Bridging spurring of both sacroiliac joints. Grade 1 anterolisthesis at L5-S1. Lumbar spondylosis and degenerative disc disease causing multilevel impingement most striking at L5-S1. IMPRESSION: 1. Essentially stable appearance of the hepatic metastatic lesions, not unexpected given that prior exam was from 2  days ago. The left paraspinal soft tissue density at T11 is likewise stable. 2. Stable architectural distortion, scarring, and chronic nodularity in the left upper lobe, much of which is likely therapy related. 3. Swirling of the small bowel mesenteric vessels in the pelvis, but no dilated bowel to suggest a significant volvulus. 4. Other imaging findings of potential clinical significance: Airway thickening is present, suggesting bronchitis or reactive airways disease. Stable ascending thoracic aortic aneurysm, 4.2 cm in diameter. Cholelithiasis. Lumbar spondylosis and degenerative disc disease causing multilevel impingement most striking at L5-S1. Bridging spurring of both sacroiliac joints. Aortic Atherosclerosis (ICD10-I70.0) and Emphysema (ICD10-J43.9). Electronically Signed   By: Van Clines M.D.   On: 03/26/2019 10:58   CT ABDOMEN PELVIS W CONTRAST  Result Date: 03/24/2019 CLINICAL DATA:  69 year old male with chest pain. History of hepatocellular carcinoma/hepatocellular adenocarcinoma with a positive biopsy  in the posterior mediastinum. Subsequent biopsy of liver mass was negative. EXAM: CT ANGIOGRAPHY CHEST CT ABDOMEN AND PELVIS WITH CONTRAST TECHNIQUE: Multidetector CT imaging of the chest was performed using the standard protocol during bolus administration of intravenous contrast. Multiplanar CT image reconstructions and MIPs were obtained to evaluate the vascular anatomy. Multidetector CT imaging of the abdomen and pelvis was performed using the standard protocol during bolus administration of intravenous contrast. CONTRAST:  67m OMNIPAQUE IOHEXOL 350 MG/ML SOLN COMPARISON:  11/29/2018, CT 06/18/2014, 07/26/2017, 06/30/2018 FINDINGS: CTA CHEST FINDINGS Cardiovascular: Heart: Heart size unchanged. No pericardial fluid/thickening. Calcifications of the left main, left anterior descending, circumflex, right coronary arteries. Aorta: Greatest diameter of the ascending aorta estimated 42 mm which is unchanged from the comparison. Opacification of the aorta it is limited given the timing of the contrast bolus for the pulmonary arteries. Common origin of the innominate artery an the left common carotid artery. Mild atherosclerosis of the aortic arch. Pulmonary arteries: No central, lobar, segmental, or proximal subsegmental filling defects. Pulmonary arteries are attenuated within the left upper lobe secondary to treatment effect. Mediastinum/Nodes: No mediastinal adenopathy. Unremarkable appearance of the thoracic esophagus. Nodularity of the posterior mediastinum is significantly decreased as compared to the pre treatment CT of 06/30/2018. Unremarkable thoracic inlet. Lungs/Pleura: Advanced paraseptal and centrilobular emphysema, including bullous changes at the bilateral lung apices. Redemonstration of treatment changes of the left upper lung, with architectural distortion, scarring, and pleural thickening persisting. Subpleural reticulation at the right lower lobe lung base. No confluent airspace disease. No pleural  effusion. Review of the MIP images confirms the above findings. CT ABDOMEN and PELVIS FINDINGS Hepatobiliary: Redemonstration of the heterogeneous mass within the right liver, segment 8, with well-defined border and a diameter of 2.7 cm, unchanged. Compared to prior CT there is a new satellite nodule extending inferior and lateral to this lesion on image 15 of series 4. Second hepatic lesion measuring 2.1 cm within segment 6 on image 22 of series 4, new. Third a [place] lesion segment 6 at the tip of the liver, image 27 of series 4 Additionally there is a heterogeneously hypoattenuating/enhancing region of the left liver with what appears to be occlusion of the left hepatic vein (image 14 of series 4. Hyper dense material in the dependent aspect of the gallbladder, without inflammatory changes. Pancreas: Unremarkable Spleen: Unremarkable Adrenals/Urinary Tract: - Right adrenal gland:  Unremarkable - Left adrenal gland: Unremarkable. - Right kidney: No hydronephrosis, nephrolithiasis, inflammation, or ureteral dilation. Rounded lesions on the lateral cortex of the right kidney, most likely benign, unchanged - Left Kidney: No hydronephrosis, nephrolithiasis, inflammation, or ureteral dilation. No focal  lesion. - Urinary Bladder: Unremarkable. Stomach/Bowel: - Stomach: Hiatal hernia with otherwise unremarkable stomach - Small bowel: Unremarkable - Appendix: Normal - Colon: Mild stool burden.  No focal inflammatory changes. Vascular/Lymphatic: Atherosclerotic calcifications of the abdominal aorta and bilateral iliac arteries. Proximal femoral arteries remain patent. No pelvic or abdominal adenopathy. Reproductive: Transverse diameter of the prostate measures 41 mm Other: Surgical changes along the midline abdomen with laxity in the midline. Small bowel loops are immediately subjacent to the midline laparotomy site Musculoskeletal: Degenerative changes of the thoracolumbar spine. Vacuum disc phenomenon spans all levels  of the lumbar spine. Lytic lesion of T11 with sclerotic border. While this is unchanged in size from the most recent comparison CT, it is new from 2019 and in the region of the pathologically proven tumor of the posterior mediastinum. Review of the MIP images confirms the above findings. IMPRESSION: CT negative for pulmonary emboli, with no acute finding to account for chest pain. Progression of hepatic malignancy, with at least 3 new hepatic lesions, separate from the previously biopsied lesion of the right liver. In addition there is questionable new infiltrative disease of the left liver which appears to occlude the left hepatic vein. Referral for follow-up with oncology is indicated, as well as consideration of contrast-enhanced liver MRI. Treatment changes of the left upper lobe without evidence of local recurrence/progression. In addition, the T11 malignant lytic lesion and biopsy proven tumor of left posterior mediastinal/paravertebral T11 level are unchanged. Advanced emphysema.  Emphysema (ICD10-J43.9). Aortic atherosclerosis and iliac arterial disease. Aortic Atherosclerosis (ICD10-I70.0). Unchanged diameter of ascending aorta, 4.2 cm. Recommend annual imaging followup by CTA or MRA. This recommendation follows 2010 ACCF/AHA/AATS/ACR/ASA/SCA/SCAI/SIR/STS/SVM Guidelines for the Diagnosis and Management of Patients with Thoracic Aortic Disease. Circulation. 2010; 121: K599-J570. Aortic aneurysm NOS (ICD10-I71.9) Electronically Signed   By: Corrie Mckusick D.O.   On: 03/24/2019 08:53   PERIPHERAL VASCULAR CATHETERIZATION  Result Date: 04/10/2019 See Op Note  US BIOPSY (LIVER)  Result Date: 03/30/2019 INDICATION: History of lung cancer and hepatocellular carcinoma. New right inferior hepatic lesion EXAM: ULTRASOUND GUIDED CORE BIOPSY OF NEW RIGHT INFERIOR HEPATIC LESION MEDICATIONS: 1% LIDOCAINE LOCAL ANESTHESIA/SEDATION: Fentanyl 100 mcg IV; Versed 2.0 mg IV Moderate Sedation Time:  13 MINUTES The  patient was continuously monitored during the procedure by the interventional radiology nurse under my direct supervision. PROCEDURE: The procedure, risks, benefits, and alternatives were explained to the patient. Questions regarding the procedure were encouraged and answered. The patient understands and consents to the procedure. The right anterior subcostal area was prepped with ChloraPrep in a sterile fashion, and a sterile drape was applied covering the operative field. A sterile gown and sterile gloves were used for the procedure. Local anesthesia was provided with 1% Lidocaine. previous imaging reviewed. Preliminary ultrasound performed of the right hepatic lobe from a right upper quadrant subcostal approach. The right inferior hepatic lesion was localized and marked. Under sterile conditions and local anesthesia, a 17 gauge coaxial guide was advanced to the lesion. Images obtained for documentation. 2 18 gauge core biopsies obtained. Samples were intact and non fragmented. These were placed in formalin. Needle removed. Needle tract occluded with Gel-Foam. Postprocedure imaging demonstrates no hemorrhage or hematoma. Patient tolerated biopsy well. COMPLICATIONS: None immediate. FINDINGS: Imaging confirms needle placement in the right inferior hepatic lesion for core biopsy IMPRESSION: Successful ultrasound right inferior hepatic lesion 18 gauge core biopsies Electronically Signed   By: Jerilynn Mages.  Shick M.D.   On: 03/30/2019 10:34   DG [facility]  Result Date: 03/24/2019 CLINICAL DATA:  Acute chest pain. Patient with LEFT lung cancer. EXAM: PORTABLE CHEST 1 VIEW COMPARISON:  08/01/2017 FINDINGS: Cardiomediastinal silhouette is unchanged. Scarring within the UPPER LEFT lung again noted. Emphysema again identified. There is no evidence of focal airspace disease, pulmonary edema, suspicious pulmonary nodule/mass, pleural effusion, or pneumothorax. No acute bony abnormalities are identified. IMPRESSION: No  evidence of acute cardiopulmonary disease. Electronically Signed   By: Margarette Canada M.D.   On: 03/24/2019 07:39      ASSESSMENT & PLAN:  1. Stage IV adenocarcinoma of lung, left ([place])   2. Neoplasm related pain   3. Hepatocellular carcinoma metastatic to left lung ([place])   4. Bone lesion    #Stage IV Lung adenocarcinoma, Stage IV HCC-07/17/2018 paraspinal soft tissue biopsy Status post cycle 1 carbo/Taxol/Avastin/Tecentriq. Overall he tolerates well. Labs reviewed and discussed with patient.  #Mild hyponatremia, discussed with patient and encourage oral hydration. #Neoplasm related pain, we discussed in the instructions of using fentanyl patch with as needed oxycodone.  Pain has improved.  I will continue current regimen.  He applies two 12 MCG fentanyl patches every 3 days. Advised patient to use up his current supply and I will give him a new prescription of fentanyl 25 MCG.  #Sclerotic T11 lesion.  Continue Zometa every 4 to 6 weeks.  Patient declines taking calcium supplementation as it causes rash.  Zometa was last given on 04/06/2019.  I will schedule patient to get Zometa next week.  #Continue follow-up with palliative care service. Patient's daughter was called during the encounter and she was updated about patient's condition and treatment plan. We spent sufficient time to discuss many aspect of care, questions were answered to patient's satisfaction. All questions were answered. The patient knows to call the clinic with any problems questions or concerns.   Earlie Server, MD, PhD 04/23/2019

## 2019-04-23 NOTE — Progress Notes (Signed)
Pain is well controlled.  Appetite is good but has lost 5 lbs since last visit.

## 2019-04-23 NOTE — Progress Notes (Signed)
Per Nira Conn CMA per Dr. Tasia Catchings, no treatment needed today. Pt stable at discharge.

## 2019-04-24 ENCOUNTER — Telehealth: Payer: Self-pay

## 2019-04-24 NOTE — Telephone Encounter (Signed)
Please schedule and I will inform patient about addition of Zometa.

## 2019-04-24 NOTE — Telephone Encounter (Signed)
-----   Message from Earlie Server, MD sent at 04/23/2019  8:48 PM EST ----- Please also schedule him for zometa when he comes to get labs done. Thanks.

## 2019-04-24 NOTE — Telephone Encounter (Signed)
Zometa has been added to pts 04/30/19 lab appt Labs were scheduled at 1045 but had to be changed to 1245 Due to adding a 1.5 hr infusion that had to be sched in the afternoon. Pts Phone appt with Palliative Care was also changed to another day due to other appts changing

## 2019-04-24 NOTE — Telephone Encounter (Signed)
Patient informed and understands appt change on Monday (04/30/19).  Will mail him an updated AVS with new appts.

## 2019-04-30 ENCOUNTER — Other Ambulatory Visit: Payer: Self-pay | Admitting: Oncology

## 2019-04-30 ENCOUNTER — Inpatient Hospital Stay: Payer: Medicare Other

## 2019-04-30 ENCOUNTER — Inpatient Hospital Stay: Payer: Medicare Other | Admitting: *Deleted

## 2019-04-30 ENCOUNTER — Encounter: Payer: Medicare Other | Admitting: Hospice and Palliative Medicine

## 2019-04-30 VITALS — BP 110/84 | HR 85 | Temp 96.0°F | Resp 18

## 2019-04-30 DIAGNOSIS — C220 Liver cell carcinoma: Secondary | ICD-10-CM

## 2019-04-30 DIAGNOSIS — I712 Thoracic aortic aneurysm, without rupture: Secondary | ICD-10-CM | POA: Diagnosis not present

## 2019-04-30 DIAGNOSIS — Z95828 Presence of other vascular implants and grafts: Secondary | ICD-10-CM

## 2019-04-30 DIAGNOSIS — C7802 Secondary malignant neoplasm of left lung: Secondary | ICD-10-CM | POA: Diagnosis not present

## 2019-04-30 DIAGNOSIS — M47816 Spondylosis without myelopathy or radiculopathy, lumbar region: Secondary | ICD-10-CM | POA: Diagnosis not present

## 2019-04-30 DIAGNOSIS — K449 Diaphragmatic hernia without obstruction or gangrene: Secondary | ICD-10-CM | POA: Diagnosis not present

## 2019-04-30 DIAGNOSIS — G893 Neoplasm related pain (acute) (chronic): Secondary | ICD-10-CM | POA: Diagnosis not present

## 2019-04-30 DIAGNOSIS — C3492 Malignant neoplasm of unspecified part of left bronchus or lung: Secondary | ICD-10-CM

## 2019-04-30 DIAGNOSIS — M25512 Pain in left shoulder: Secondary | ICD-10-CM | POA: Diagnosis not present

## 2019-04-30 DIAGNOSIS — B1920 Unspecified viral hepatitis C without hepatic coma: Secondary | ICD-10-CM | POA: Diagnosis not present

## 2019-04-30 DIAGNOSIS — E871 Hypo-osmolality and hyponatremia: Secondary | ICD-10-CM | POA: Diagnosis not present

## 2019-04-30 DIAGNOSIS — J439 Emphysema, unspecified: Secondary | ICD-10-CM | POA: Diagnosis not present

## 2019-04-30 DIAGNOSIS — Z5111 Encounter for antineoplastic chemotherapy: Secondary | ICD-10-CM | POA: Diagnosis not present

## 2019-04-30 DIAGNOSIS — I10 Essential (primary) hypertension: Secondary | ICD-10-CM | POA: Diagnosis not present

## 2019-04-30 DIAGNOSIS — J948 Other specified pleural conditions: Secondary | ICD-10-CM | POA: Diagnosis not present

## 2019-04-30 DIAGNOSIS — K8020 Calculus of gallbladder without cholecystitis without obstruction: Secondary | ICD-10-CM | POA: Diagnosis not present

## 2019-04-30 DIAGNOSIS — I700 Atherosclerosis of aorta: Secondary | ICD-10-CM | POA: Diagnosis not present

## 2019-04-30 DIAGNOSIS — Z5112 Encounter for antineoplastic immunotherapy: Secondary | ICD-10-CM | POA: Diagnosis not present

## 2019-04-30 LAB — COMPREHENSIVE METABOLIC PANEL
ALT: 48 U/L — ABNORMAL HIGH (ref 0–44)
AST: 37 U/L (ref 15–41)
Albumin: 3.4 g/dL — ABNORMAL LOW (ref 3.5–5.0)
Alkaline Phosphatase: 80 U/L (ref 38–126)
Anion gap: 5 (ref 5–15)
BUN: 8 mg/dL (ref 8–23)
CO2: 23 mmol/L (ref 22–32)
Calcium: 8.7 mg/dL — ABNORMAL LOW (ref 8.9–10.3)
Chloride: 107 mmol/L (ref 98–111)
Creatinine, Ser: 0.72 mg/dL (ref 0.61–1.24)
GFR calc Af Amer: >60 mL/min (ref >60)
GFR calc non Af Amer: >60 mL/min (ref >60)
Glucose, Bld: 107 mg/dL — ABNORMAL HIGH (ref 70–99)
Potassium: 3.7 mmol/L (ref 3.5–5.1)
Sodium: 135 mmol/L (ref 135–145)
Total Bilirubin: 0.5 mg/dL (ref 0.3–1.2)
Total Protein: 7.3 g/dL (ref 6.5–8.1)

## 2019-04-30 LAB — CBC WITH DIFFERENTIAL/PLATELET
Abs Immature Granulocytes: 0.03 10*3/uL (ref 0.00–0.07)
Basophils Absolute: 0 10*3/uL (ref 0.0–0.1)
Basophils Relative: 1 %
Eosinophils Absolute: 0.4 10*3/uL (ref 0.0–0.5)
Eosinophils Relative: 13 %
HCT: 41 % (ref 39.0–52.0)
Hemoglobin: 14 g/dL (ref 13.0–17.0)
Immature Granulocytes: 1 %
Lymphocytes Relative: 43 %
Lymphs Abs: 1.3 10*3/uL (ref 0.7–4.0)
MCH: 30.6 pg (ref 26.0–34.0)
MCHC: 34.1 g/dL (ref 30.0–36.0)
MCV: 89.7 fL (ref 80.0–100.0)
Monocytes Absolute: 0.8 10*3/uL (ref 0.1–1.0)
Monocytes Relative: 26 %
Neutro Abs: 0.5 10*3/uL — ABNORMAL LOW (ref 1.7–7.7)
Neutrophils Relative %: 16 %
Platelets: 299 10*3/uL (ref 150–400)
RBC: 4.57 MIL/uL (ref 4.22–5.81)
RDW: 14.2 % (ref 11.5–15.5)
WBC: 3 10*3/uL — ABNORMAL LOW (ref 4.0–10.5)
nRBC: 0 % (ref 0.0–0.2)

## 2019-04-30 MED ORDER — SODIUM CHLORIDE 0.9% FLUSH
10.0000 mL | Freq: Once | INTRAVENOUS | Status: AC
  Administered 2019-04-30: 10 mL via INTRAVENOUS
  Filled 2019-04-30: qty 10

## 2019-04-30 MED ORDER — SODIUM CHLORIDE 0.9 % IV SOLN
Freq: Once | INTRAVENOUS | Status: AC
  Filled 2019-04-30: qty 250

## 2019-04-30 MED ORDER — HEPARIN SOD (PORK) LOCK FLUSH 100 UNIT/ML IV SOLN
500.0000 [IU] | Freq: Once | INTRAVENOUS | Status: AC | PRN
  Administered 2019-04-30: 14:00:00 500 [IU]
  Filled 2019-04-30: qty 5

## 2019-04-30 MED ORDER — ZOLEDRONIC ACID 4 MG/100ML IV SOLN
4.0000 mg | Freq: Once | INTRAVENOUS | Status: AC
  Administered 2019-04-30: 4 mg via INTRAVENOUS
  Filled 2019-04-30: qty 100

## 2019-04-30 NOTE — Progress Notes (Signed)
Calcium 8.7, Per Dr. Tasia Catchings, okay to proceed with Zometa.

## 2019-05-01 ENCOUNTER — Encounter: Payer: Self-pay | Admitting: Oncology

## 2019-05-01 ENCOUNTER — Ambulatory Visit: Payer: Medicare Other | Attending: Internal Medicine

## 2019-05-01 DIAGNOSIS — Z23 Encounter for immunization: Secondary | ICD-10-CM

## 2019-05-01 MED ORDER — FENTANYL 25 MCG/HR TD PT72: 1.0000 | MEDICATED_PATCH | TRANSDERMAL | 0 refills | Status: DC

## 2019-05-01 NOTE — Progress Notes (Signed)
Received a refill request for transdermal fentanyl.  Patient has been using two 12 and half micrograms patches to equal 25 mcg dose.  Will refill 25 mcg patches.  Apply 1 patch every 72 hours.  #10 patches

## 2019-05-01 NOTE — Progress Notes (Signed)
   [id number]  Name:  Samuel Hood.    MRN: [id number] DOB: [birth year]  05/01/2019  [name] was observed post Covid-19 immunization for 15 minutes without incident. He was provided with Vaccine Information Sheet and instruction to access the V-Safe system.   [name] was instructed to call 911 with any severe reactions post vaccine: Marland Kitchen Difficulty breathing  . Swelling of face and throat  . A fast heartbeat  . A bad rash all over body  . Dizziness and weakness   Immunizations Administered    Name Date Dose VIS Date Route   Pfizer COVID-19 Vaccine 05/01/2019 10:01 AM 0.3 mL 01/26/2019 Intramuscular   Manufacturer: [place]   Lot: [id number]   [place]: [id number]

## 2019-05-01 NOTE — Addendum Note (Signed)
Addended by: Irean Hong on: 05/01/2019 04:49 PM   Modules accepted: Orders

## 2019-05-02 NOTE — Telephone Encounter (Signed)
Omniseq results scanned under [employer] in chart.

## 2019-05-03 ENCOUNTER — Inpatient Hospital Stay (HOSPITAL_BASED_OUTPATIENT_CLINIC_OR_DEPARTMENT_OTHER): Payer: Medicare Other | Admitting: Hospice and Palliative Medicine

## 2019-05-03 DIAGNOSIS — Z515 Encounter for palliative care: Secondary | ICD-10-CM

## 2019-05-03 NOTE — Progress Notes (Signed)
Attempted to call patient but was unable to reach him.  Not able to leave a voicemail.  Will reschedule.

## 2019-05-07 ENCOUNTER — Other Ambulatory Visit: Payer: Medicare Other

## 2019-05-07 ENCOUNTER — Ambulatory Visit: Payer: Medicare Other | Admitting: Oncology

## 2019-05-07 ENCOUNTER — Ambulatory Visit: Payer: Medicare Other

## 2019-05-09 ENCOUNTER — Inpatient Hospital Stay (HOSPITAL_BASED_OUTPATIENT_CLINIC_OR_DEPARTMENT_OTHER): Payer: Medicare Other | Admitting: Oncology

## 2019-05-09 ENCOUNTER — Encounter: Payer: Self-pay | Admitting: Oncology

## 2019-05-09 ENCOUNTER — Inpatient Hospital Stay: Payer: Medicare Other

## 2019-05-09 VITALS — BP 119/85 | HR 18 | Temp 98.4°F | Resp 16 | Wt 139.0 lb

## 2019-05-09 DIAGNOSIS — C7802 Secondary malignant neoplasm of left lung: Secondary | ICD-10-CM

## 2019-05-09 DIAGNOSIS — J439 Emphysema, unspecified: Secondary | ICD-10-CM | POA: Diagnosis not present

## 2019-05-09 DIAGNOSIS — C3492 Malignant neoplasm of unspecified part of left bronchus or lung: Secondary | ICD-10-CM

## 2019-05-09 DIAGNOSIS — B1920 Unspecified viral hepatitis C without hepatic coma: Secondary | ICD-10-CM | POA: Diagnosis not present

## 2019-05-09 DIAGNOSIS — M899 Disorder of bone, unspecified: Secondary | ICD-10-CM | POA: Diagnosis not present

## 2019-05-09 DIAGNOSIS — M47816 Spondylosis without myelopathy or radiculopathy, lumbar region: Secondary | ICD-10-CM | POA: Diagnosis not present

## 2019-05-09 DIAGNOSIS — Z5112 Encounter for antineoplastic immunotherapy: Secondary | ICD-10-CM | POA: Diagnosis not present

## 2019-05-09 DIAGNOSIS — M25512 Pain in left shoulder: Secondary | ICD-10-CM | POA: Diagnosis not present

## 2019-05-09 DIAGNOSIS — C7951 Secondary malignant neoplasm of bone: Secondary | ICD-10-CM

## 2019-05-09 DIAGNOSIS — C220 Liver cell carcinoma: Secondary | ICD-10-CM

## 2019-05-09 DIAGNOSIS — K8020 Calculus of gallbladder without cholecystitis without obstruction: Secondary | ICD-10-CM | POA: Diagnosis not present

## 2019-05-09 DIAGNOSIS — Z5111 Encounter for antineoplastic chemotherapy: Secondary | ICD-10-CM

## 2019-05-09 DIAGNOSIS — G893 Neoplasm related pain (acute) (chronic): Secondary | ICD-10-CM

## 2019-05-09 DIAGNOSIS — I700 Atherosclerosis of aorta: Secondary | ICD-10-CM | POA: Diagnosis not present

## 2019-05-09 DIAGNOSIS — I712 Thoracic aortic aneurysm, without rupture: Secondary | ICD-10-CM | POA: Diagnosis not present

## 2019-05-09 DIAGNOSIS — I10 Essential (primary) hypertension: Secondary | ICD-10-CM | POA: Diagnosis not present

## 2019-05-09 DIAGNOSIS — J948 Other specified pleural conditions: Secondary | ICD-10-CM | POA: Diagnosis not present

## 2019-05-09 DIAGNOSIS — K449 Diaphragmatic hernia without obstruction or gangrene: Secondary | ICD-10-CM | POA: Diagnosis not present

## 2019-05-09 DIAGNOSIS — E871 Hypo-osmolality and hyponatremia: Secondary | ICD-10-CM | POA: Diagnosis not present

## 2019-05-09 LAB — CBC WITH DIFFERENTIAL/PLATELET
Abs Immature Granulocytes: 0.11 10*3/uL — ABNORMAL HIGH (ref 0.00–0.07)
Basophils Absolute: 0.1 10*3/uL (ref 0.0–0.1)
Basophils Relative: 1 %
Eosinophils Absolute: 0.3 10*3/uL (ref 0.0–0.5)
Eosinophils Relative: 4 %
HCT: 40.6 % (ref 39.0–52.0)
Hemoglobin: 13.9 g/dL (ref 13.0–17.0)
Immature Granulocytes: 1 %
Lymphocytes Relative: 19 %
Lymphs Abs: 1.6 10*3/uL (ref 0.7–4.0)
MCH: 30.2 pg (ref 26.0–34.0)
MCHC: 34.2 g/dL (ref 30.0–36.0)
MCV: 88.1 fL (ref 80.0–100.0)
Monocytes Absolute: 1.1 10*3/uL — ABNORMAL HIGH (ref 0.1–1.0)
Monocytes Relative: 13 %
Neutro Abs: 5.4 10*3/uL (ref 1.7–7.7)
Neutrophils Relative %: 62 %
Platelets: 284 10*3/uL (ref 150–400)
RBC: 4.61 MIL/uL (ref 4.22–5.81)
RDW: 13.8 % (ref 11.5–15.5)
WBC: 8.6 10*3/uL (ref 4.0–10.5)
nRBC: 0 % (ref 0.0–0.2)

## 2019-05-09 LAB — URINALYSIS, DIPSTICK ONLY
Bilirubin Urine: NEGATIVE
Glucose, UA: NEGATIVE mg/dL
Hgb urine dipstick: NEGATIVE
Ketones, ur: NEGATIVE mg/dL
Leukocytes,Ua: NEGATIVE
Nitrite: NEGATIVE
Protein, ur: NEGATIVE mg/dL
Specific Gravity, Urine: 1.008 (ref 1.005–1.030)
pH: 6 (ref 5.0–8.0)

## 2019-05-09 LAB — COMPREHENSIVE METABOLIC PANEL
ALT: 40 U/L (ref 0–44)
AST: 43 U/L — ABNORMAL HIGH (ref 15–41)
Albumin: 3.4 g/dL — ABNORMAL LOW (ref 3.5–5.0)
Alkaline Phosphatase: 86 U/L (ref 38–126)
Anion gap: 10 (ref 5–15)
BUN: 7 mg/dL — ABNORMAL LOW (ref 8–23)
CO2: 24 mmol/L (ref 22–32)
Calcium: 8.7 mg/dL — ABNORMAL LOW (ref 8.9–10.3)
Chloride: 103 mmol/L (ref 98–111)
Creatinine, Ser: 0.74 mg/dL (ref 0.61–1.24)
GFR calc Af Amer: >60 mL/min (ref >60)
GFR calc non Af Amer: >60 mL/min (ref >60)
Glucose, Bld: 104 mg/dL — ABNORMAL HIGH (ref 70–99)
Potassium: 3.4 mmol/L — ABNORMAL LOW (ref 3.5–5.1)
Sodium: 137 mmol/L (ref 135–145)
Total Bilirubin: 0.5 mg/dL (ref 0.3–1.2)
Total Protein: 7.6 g/dL (ref 6.5–8.1)

## 2019-05-09 MED ORDER — SODIUM CHLORIDE 0.9 % IV SOLN
470.0000 mg | Freq: Once | INTRAVENOUS | Status: AC
  Administered 2019-05-09: 470 mg via INTRAVENOUS
  Filled 2019-05-09: qty 47

## 2019-05-09 MED ORDER — PALONOSETRON HCL INJECTION 0.25 MG/5ML
0.2500 mg | Freq: Once | INTRAVENOUS | Status: AC
  Administered 2019-05-09: 0.25 mg via INTRAVENOUS
  Filled 2019-05-09: qty 5

## 2019-05-09 MED ORDER — HEPARIN SOD (PORK) LOCK FLUSH 100 UNIT/ML IV SOLN
500.0000 [IU] | Freq: Once | INTRAVENOUS | Status: AC | PRN
  Administered 2019-05-09: 500 [IU]
  Filled 2019-05-09: qty 5

## 2019-05-09 MED ORDER — OXYCODONE HCL 5 MG PO TABS: 5.0000 mg | ORAL_TABLET | Freq: Three times a day (TID) | ORAL | 0 refills | Status: DC | PRN

## 2019-05-09 MED ORDER — SODIUM CHLORIDE 0.9 % IV SOLN
150.0000 mg | Freq: Once | INTRAVENOUS | Status: AC
  Administered 2019-05-09: 150 mg via INTRAVENOUS
  Filled 2019-05-09: qty 150

## 2019-05-09 MED ORDER — SODIUM CHLORIDE 0.9 % IV SOLN
200.0000 mg/m2 | Freq: Once | INTRAVENOUS | Status: AC
  Administered 2019-05-09: 354 mg via INTRAVENOUS
  Filled 2019-05-09: qty 59

## 2019-05-09 MED ORDER — SODIUM CHLORIDE 0.9 % IV SOLN
15.0000 mg/kg | Freq: Once | INTRAVENOUS | Status: AC
  Administered 2019-05-09: 1000 mg via INTRAVENOUS
  Filled 2019-05-09: qty 32

## 2019-05-09 MED ORDER — SODIUM CHLORIDE 0.9 % IV SOLN
Freq: Once | INTRAVENOUS | Status: AC
  Filled 2019-05-09: qty 250

## 2019-05-09 MED ORDER — DIPHENHYDRAMINE HCL 50 MG/ML IJ SOLN
50.0000 mg | Freq: Once | INTRAMUSCULAR | Status: AC
  Administered 2019-05-09: 50 mg via INTRAVENOUS
  Filled 2019-05-09: qty 1

## 2019-05-09 MED ORDER — FAMOTIDINE IN NACL 20-0.9 MG/50ML-% IV SOLN
20.0000 mg | Freq: Once | INTRAVENOUS | Status: AC
  Administered 2019-05-09: 20 mg via INTRAVENOUS
  Filled 2019-05-09: qty 50

## 2019-05-09 MED ORDER — SODIUM CHLORIDE 0.9 % IV SOLN
1200.0000 mg | Freq: Once | INTRAVENOUS | Status: AC
  Administered 2019-05-09: 10:00:00 1200 mg via INTRAVENOUS
  Filled 2019-05-09: qty 20

## 2019-05-09 MED ORDER — SODIUM CHLORIDE 0.9 % IV SOLN
10.0000 mg | Freq: Once | INTRAVENOUS | Status: AC
  Administered 2019-05-09: 10 mg via INTRAVENOUS
  Filled 2019-05-09: qty 1

## 2019-05-09 NOTE — Progress Notes (Signed)
Hematology/Oncology follow up note [facility] [phone] [phone]   Patient Care Team: Paulene Floor as PCP - General (Physician Assistant) Telford Nab, RN as Registered Nurse Clent Jacks, RN as Registered Nurse  REFERRING PROVIDER: Trinna Post, PA-C  CHIEF COMPLAINTS/REASON FOR VISIT:  Discussion of metastatic cancer management.  HISTORY OF PRESENTING ILLNESS:   Samuel Hood. is a  69 y.o.  male with PMH listed below was seen in consultation at the request of  Terrilee Croak, Adriana M, PA-C  for evaluation of lung nodule in the liver lesion. Patient has a past medical history of hypertension, alcohol abuse, smoking emphysema.  He has had serial CTs done in the past.  CT images were independently reviewed by me. 07/26/2017 CT chest with contrast showed extensive pleural-parenchymal scarring within the left upper lobe with several areas of soft tissue nodularity measuring up to 1.6 cm.  In this patient who is at increased risk of lung cancer further investigation with PET scan is recommended. Small chronic appearance loculated hydropneumothorax overlies the left apex. Slowly enlarging lesion within the central portion of the right lobe of liver identified. Per note, patient supposed to have additional work-up done in December repeat CT scan which he did not  Patient was recently seen by primary care provider and has subsequent image done for follow-up. CT on 06/30/2018 showed multiple significantly enlarged paraspinal mass are noted concerning.  The largest measures 3.8 x 0.5 cm and there appears to be a lytic destruction of the adjacent T11 vertebral body.  Also noted is new solid density measuring 17 x 12 mm in the pleural parenchymal density in the left upper lobe noted on prior exam.  Concerning for malignancy.  4.4 ascending thoracic aortic aneurysm.  Recommend annual imaging.  Patient had PET scan done on  07/06/2018 Which showed there are 2 FDG avid lesion within the left upper lobe worrisome for primary lung neoplasm.  There is evidence of chest wall involvement.  Metastasis to the posterior mediastinum is identified with extension into T11 vertebra and possible involvement of the left T12 neural foramina.  Patient reports left shoulder pain.  Smoking half a pack a day not motivated with further questioning quitting Denies weight loss, hemoptysis, cough.  Chronic shortness of breath with exertion. He drinks alcohol daily. Lives with his girlfriend.  He has 5 adult kids  #Hepatitis C  # 6/1?2020  CT-guided biopsy of left-sided posterior mediastinal soft tissue adjacent to T10/11. Patient's case was discussed on tumor board. Consensus was reached that [facility] versus primary lung cancer with hepatoid adenocarcinoma features. Consensus was to proceed with liver biopsy first as liver mass was not FDG avid on PET scan.  Patient underwent liver biopsy and the present to discuss pathology reports.  ## 08/08/2018 Liver mass biopsy showed fragments of necrotic and calcified tissue with adjacent fibrous capsule Scant viable nonneoplastic liver tissue with steatosis, inflammation and non specific fibrosis.  # 08/28/2018 CT guided left upper lobe lung mass biopsy showed poorly differentiated adenocarcinoma of lung origin.  6/19-08/16/2018  Finished palliative radiation to paraspinal soft tissue mass. 09/07/2018 patient was started on lenvatinib 12 mg daily. 10/13/2018 SBRT to lung cancer lesion.  #Patient is on Zometa monthly #He was advised to take calcium supplement.  He reports he quit taking calcium as he broke out rash on her scalp, neck and chest after taking calcium. # 03/30/19 liver lesion biopsy showed poorly differentiated adenocarcinoma, compatible with lung primary.  INTERVAL HISTORY Samuel Hood. is a 69 y.o. male who has above history reviewed by me today presents for follow up visit for  assessment of prior to chemotherapy for the treatment of Samuel Hood and lung cancer. Patient reports that his chest wall pain has improved.  Is currently using fentanyl patch 25 MCG per hour every 72 hours, oxycodone 5 mg as needed. Usually use 1 oxycodone daily. Denies any nausea, vomiting, fever or chills. Appetite is good No new complaints. He has good bowel movement.  No constipation.  Not taking Senokot.  Review of Systems  Constitutional: Positive for fatigue. Negative for appetite change, chills, fever and unexpected weight change.  HENT:   Negative for hearing loss and voice change.   Eyes: Negative for eye problems and icterus.  Respiratory: Negative for chest tightness, cough and shortness of breath.   Cardiovascular: Negative for chest pain and leg swelling.  Gastrointestinal: Negative for abdominal distention, abdominal pain and constipation.  Endocrine: Negative for hot flashes.  Genitourinary: Negative for difficulty urinating, dysuria and frequency.   Musculoskeletal: Negative for arthralgias.  Skin: Negative for itching and rash.  Neurological: Negative for light-headedness and numbness.  Hematological: Negative for adenopathy. Does not bruise/bleed easily.  Psychiatric/Behavioral: Negative for confusion.    MEDICAL HISTORY:  Past Medical History:  Diagnosis Date  . Hepatocellular carcinoma metastatic to left lung ([place]) 07/24/2018  . History of angiography    left lower extremity  . Hypertension   . Small bowel obstruction ([place])     SURGICAL HISTORY: Past Surgical History:  Procedure Laterality Date  . COLON SURGERY    . COLONOSCOPY W/ POLYPECTOMY    . COLONOSCOPY WITH PROPOFOL N/A 01/31/2018   Procedure: COLONOSCOPY WITH PROPOFOL;  Surgeon: Toledo, Benay Pike, MD;  Location: [facility] ENDOSCOPY;  Service: Gastroenterology;  Laterality: N/A;  . debridement fasciotomy leg, left Left 03/19/2016  . LAPAROTOMY N/A 09/27/2014   Procedure: EXPLORATORY LAPAROTOMY;  Surgeon: Dia Crawford III, MD;  Location: [facility] ORS;  Service: General;  Laterality: N/A;  . PORTA CATH INSERTION N/A 04/10/2019   Procedure: PORTA CATH INSERTION;  Surgeon: Katha Cabal, MD;  Location: [facility];  Service: Cardiovascular;  Laterality: N/A;    SOCIAL HISTORY: Social History   Socioeconomic History  . Marital status: Single    Spouse name: Not on file  . Number of children: Not on file  . Years of education: Not on file  . Highest education level: Not on file  Occupational History  . Occupation: retired  Tobacco Use  . Smoking status: Former Smoker    Packs/day: 0.25    Years: 50.00    Pack years: 12.50  . Smokeless tobacco: Never Used  . Tobacco comment: 1-2/week  Substance and Sexual Activity  . Alcohol use: Not Currently    Alcohol/week: 35.0 standard drinks    Types: 30 Cans of beer, 5 Shots of liquor per week  . Drug use: No  . Sexual activity: Not on file  Other Topics Concern  . Not on file  Social History Narrative  . Not on file   Social Determinants of Health   Financial Resource Strain: High Risk  . Difficulty of Paying Living Expenses: Hard  Food Insecurity: No Food Insecurity  . Worried About Charity fundraiser in the Last Year: Never true  . Ran Out of Food in the Last Year: Never true  Transportation Needs: No Transportation Needs  . Lack of Transportation (Medical): No  .  Lack of Transportation (Non-Medical): No  Physical Activity: Sufficiently Active  . Days of Exercise per Week: 7 days  . Minutes of Exercise per Session: 30 min  Stress: No Stress Concern Present  . Feeling of Stress : Not at all  Social Connections: Unknown  . Frequency of Communication with Friends and Family: More than three times a week  . Frequency of Social Gatherings with Friends and Family: More than three times a week  . Attends Religious Services: More than 4 times per year  . Active Member of Clubs or Organizations: Not on file  . Attends Theatre manager Meetings: Not on file  . Marital Status: Not on file  Intimate Partner Violence: Unknown  . Fear of Current or Ex-Partner: Patient refused  . Emotionally Abused: No  . Physically Abused: No  . Sexually Abused: No    FAMILY HISTORY: Family History  Problem Relation Age of Onset  . Cancer Mother   . Cancer Father     ALLERGIES:  is allergic to 5-alpha reductase inhibitors.  MEDICATIONS:  Current Outpatient Medications  Medication Sig Dispense Refill  . amLODipine (NORVASC) 5 MG tablet Take 1 tablet (5 mg total) by mouth daily. 30 tablet 0  . fentaNYL (DURAGESIC) 25 MCG/HR Place 1 patch onto the skin every 3 (three) days. 10 patch 0  . lidocaine-prilocaine (EMLA) cream Apply to affected area once (Patient taking differently: Apply 1 application topically daily as needed (port acess). ) 30 g 3  . Multiple Vitamin (MULTIVITAMIN) capsule Take 1 capsule by mouth daily.    Marland Kitchen oxyCODONE (ROXICODONE) 5 MG immediate release tablet Take 1 tablet (5 mg total) by mouth every 4 (four) hours as needed for severe pain or breakthrough pain. 60 tablet 0  . dexamethasone (DECADRON) 4 MG tablet Take 2 tablets (8 mg total) by mouth daily. Start the day after carboplatin chemotherapy for 3 days. (Patient not taking: Reported on 04/13/2019) 30 tablet 1  . loperamide (IMODIUM) 2 MG capsule Take 1 capsule (2 mg total) by mouth See admin instructions. With onset of loose stool, take '4mg'$  followed by '2mg'$  every 2 hours until 12 hours have passed without loose bowel movement. Maximum: 16 mg/day (Patient not taking: Reported on 04/23/2019) 60 capsule 1  . NARCAN 4 MG/0.1ML LIQD nasal spray kit 1 spray once.    . ondansetron (ZOFRAN) 8 MG tablet Take 1 tablet (8 mg total) by mouth 2 (two) times daily as needed for refractory nausea / vomiting. Start on day 3 after carboplatin chemo. (Patient not taking: Reported on 04/13/2019) 30 tablet 1  . polyethylene glycol (MIRALAX) 17 g packet Take 17 g by mouth daily.  (Patient not taking: Reported on 05/09/2019) 21 each 0  . potassium chloride SA (KLOR-CON) 20 MEQ tablet Take 1 tablet (20 mEq total) by mouth daily. (Patient not taking: Reported on 04/23/2019) 7 tablet 0  . prochlorperazine (COMPAZINE) 10 MG tablet Take 1 tablet (10 mg total) by mouth every 6 (six) hours as needed (Nausea or vomiting). (Patient not taking: Reported on 04/23/2019) 30 tablet 1  . senna (SENOKOT) 8.6 MG TABS tablet Take 1 tablet (8.6 mg total) by mouth daily. (Patient not taking: Reported on 05/09/2019) 120 tablet 0   No current facility-administered medications for this visit.   Facility-Administered Medications Ordered in Other Visits  Medication Dose Route Frequency Provider Last Rate Last Admin  . atezolizumab (TECENTRIQ) 1,200 mg in sodium chloride 0.9 % 250 mL chemo infusion  1,200 mg Intravenous  Once Earlie Server, MD 540 mL/hr at 05/09/19 1012 1,200 mg at 05/09/19 1012  . bevacizumab-bvzr (ZIRABEV) 1,000 mg in sodium chloride 0.9 % 100 mL chemo infusion  15 mg/kg (Treatment Plan Recorded) Intravenous Once Earlie Server, MD      . CARBOplatin (PARAPLATIN) 470 mg in sodium chloride 0.9 % 250 mL chemo infusion  470 mg Intravenous Once Earlie Server, MD      . heparin lock flush 100 unit/mL  500 Units Intracatheter Once PRN Earlie Server, MD      . PACLitaxel (TAXOL) 354 mg in sodium chloride 0.9 % 500 mL chemo infusion (> '80mg'$ /m2)  200 mg/m2 (Treatment Plan Recorded) Intravenous Once Earlie Server, MD         PHYSICAL EXAMINATION: ECOG PERFORMANCE STATUS: 1 - Symptomatic but completely ambulatory Vitals:   05/09/19 0839  BP: 119/85  Pulse: (!) 18  Resp: 16  Temp: 98.4 F (36.9 C)   Filed Weights   05/09/19 0839  Weight: 139 lb (63 kg)    Physical Exam Constitutional:      General: He is not in acute distress. HENT:     Head: Normocephalic and atraumatic.  Eyes:     General: No scleral icterus. Cardiovascular:     Rate and Rhythm: Normal rate and regular rhythm.     Heart sounds: Normal  heart sounds.  Pulmonary:     Effort: Pulmonary effort is normal. No respiratory distress.     Breath sounds: No wheezing.  Abdominal:     General: Bowel sounds are normal. There is no distension.     Palpations: Abdomen is soft.  Musculoskeletal:        General: No deformity. Normal range of motion.     Cervical back: Normal range of motion and neck supple.  Skin:    General: Skin is warm and dry.     Findings: No erythema or rash.  Neurological:     Mental Status: He is alert and oriented to person, place, and time. Mental status is at baseline.     Cranial Nerves: No cranial nerve deficit.     Coordination: Coordination normal.  Psychiatric:        Mood and Affect: Mood normal.     LABORATORY DATA:  I have reviewed the data as listed Lab Results  Component Value Date   WBC 8.6 05/09/2019   HGB 13.9 05/09/2019   HCT 40.6 05/09/2019   MCV 88.1 05/09/2019   PLT 284 05/09/2019   Recent Labs    04/23/19 1315 04/30/19 1245 05/09/19 0810  NA 134* 135 137  K 3.5 3.7 3.4*  CL 100 107 103  CO2 '24 23 24  '$ GLUCOSE 102* 107* 104*  BUN 10 8 7*  CREATININE 0.53* 0.72 0.74  CALCIUM 8.9 8.7* 8.7*  GFRNONAA >60 >60 >60  GFRAA >60 >60 >60  PROT 7.4 7.3 7.6  ALBUMIN 3.2* 3.4* 3.4*  AST 52* 37 43*  ALT 58* 48* 40  ALKPHOS 64 80 86  BILITOT 0.5 0.5 0.5   Iron/TIBC/Ferritin/ %Sat No results found for: IRON, TIBC, FERRITIN, IRONPCTSAT   RADIOGRAPHIC STUDIES: I have personally reviewed the radiological images as listed and agreed with the findings in the report. CT Chest W Contrast  Result Date: 03/26/2019 CLINICAL DATA:  Restaging left lung cancer. Hepatocellular carcinoma restaging. EXAM: CT CHEST, ABDOMEN, AND PELVIS WITH CONTRAST TECHNIQUE: Multidetector CT imaging of the chest, abdomen and pelvis was performed following the standard protocol during bolus administration of intravenous contrast.  CONTRAST:  142m OMNIPAQUE IOHEXOL 300 MG/ML  SOLN COMPARISON:  03/24/2019 and  11/29/2018 FINDINGS: CT CHEST FINDINGS Cardiovascular: Stable ascending thoracic aortic aneurysm, 4.2 cm in diameter. Coronary, aortic arch, and branch vessel atherosclerotic vascular disease. Prominence of the azygos vein. Mediastinum/Nodes: No pathologic adenopathy identified. Lungs/Pleura: Prominent emphysema. Bilateral airway thickening. Architectural distortion, scarring, and some chronic nodularity in the left upper lobe, much of which is likely therapy related, and not appreciably changed. Musculoskeletal: Stable mild left paraspinal density at the T11 level with residual erosion along the left anterior T11 vertebral body. The soft tissue density adjacent to T11 is difficult to completely separate from the adjacent azygos vein but measures about 1.6 by 1.5 cm on image 48/2, and minimally are erodes the adjacent left lateral cortex of T11. This is roughly stable from 03/24/2019 exam. CT ABDOMEN PELVIS FINDINGS Hepatobiliary: Sharply defined segment 8 mass with mild internal heterogeneity measures 3.1 by 2.9 cm, previously 3.2 by 2.9 cm. On the prior exam was query whether this lesion occluding part of the hepatic vein, certainly the lesion is closely associated with the right hepatic vein although definite occlusion is not observed. Adjacent rim enhancing lesion in segment 81.5 by 1.5 cm, stable. A segment 6 lesion with central washout and peripheral enhancement measures about 2.2 by 2.4 cm on image 65/2, stable. Ill-defined hypodense lesion posteriorly in the right hepatic lobe approximately 0.7 cm in diameter on image 71/2. Overall the hepatic lesions are unchanged. 1.3 cm gallstone in the gallbladder. The gallbladder is mildly contracted. No biliary dilatation. Pancreas: Unremarkable Spleen: Unremarkable Adrenals/Urinary Tract: Both adrenal glands appear normal. Stable right renal hypodense lesions, likely cysts. Stomach/Bowel: Swirling of the small bowel mesentery in the central pelvis but without dilated  bowel to suggest a significant volvulus. Stable postoperative findings along proximal sigmoid colon margin. Vascular/Lymphatic: Aortoiliac atherosclerotic vascular disease. No pathologic adenopathy identified. Reproductive: Speckled calcifications along the tunica albuginea the penis. Prostate gland unchanged. Other: No supplemental non-categorized findings. Musculoskeletal: Bridging spurring of both sacroiliac joints. Grade 1 anterolisthesis at L5-S1. Lumbar spondylosis and degenerative disc disease causing multilevel impingement most striking at L5-S1. IMPRESSION: 1. Essentially stable appearance of the hepatic metastatic lesions, not unexpected given that prior exam was from 2 days ago. The left paraspinal soft tissue density at T11 is likewise stable. 2. Stable architectural distortion, scarring, and chronic nodularity in the left upper lobe, much of which is likely therapy related. 3. Swirling of the small bowel mesenteric vessels in the pelvis, but no dilated bowel to suggest a significant volvulus. 4. Other imaging findings of potential clinical significance: Airway thickening is present, suggesting bronchitis or reactive airways disease. Stable ascending thoracic aortic aneurysm, 4.2 cm in diameter. Cholelithiasis. Lumbar spondylosis and degenerative disc disease causing multilevel impingement most striking at L5-S1. Bridging spurring of both sacroiliac joints. Aortic Atherosclerosis (ICD10-I70.0) and Emphysema (ICD10-J43.9). Electronically Signed   By: WVan ClinesM.D.   On: 03/26/2019 10:58   CT Angio Chest PE W and/or Wo Contrast  Result Date: 03/24/2019 CLINICAL DATA:  69year old male with chest pain. History of hepatocellular carcinoma/hepatocellular adenocarcinoma with a positive biopsy in the posterior mediastinum. Subsequent biopsy of liver mass was negative. EXAM: CT ANGIOGRAPHY CHEST CT ABDOMEN AND PELVIS WITH CONTRAST TECHNIQUE: Multidetector CT imaging of the chest was performed using  the standard protocol during bolus administration of intravenous contrast. Multiplanar CT image reconstructions and MIPs were obtained to evaluate the vascular anatomy. Multidetector CT imaging of the abdomen and pelvis was performed using the  standard protocol during bolus administration of intravenous contrast. CONTRAST:  17m OMNIPAQUE IOHEXOL 350 MG/ML SOLN COMPARISON:  11/29/2018, CT 06/18/2014, 07/26/2017, 06/30/2018 FINDINGS: CTA CHEST FINDINGS Cardiovascular: Heart: Heart size unchanged. No pericardial fluid/thickening. Calcifications of the left main, left anterior descending, circumflex, right coronary arteries. Aorta: Greatest diameter of the ascending aorta estimated 42 mm which is unchanged from the comparison. Opacification of the aorta it is limited given the timing of the contrast bolus for the pulmonary arteries. Common origin of the innominate artery an the left common carotid artery. Mild atherosclerosis of the aortic arch. Pulmonary arteries: No central, lobar, segmental, or proximal subsegmental filling defects. Pulmonary arteries are attenuated within the left upper lobe secondary to treatment effect. Mediastinum/Nodes: No mediastinal adenopathy. Unremarkable appearance of the thoracic esophagus. Nodularity of the posterior mediastinum is significantly decreased as compared to the pre treatment CT of 06/30/2018. Unremarkable thoracic inlet. Lungs/Pleura: Advanced paraseptal and centrilobular emphysema, including bullous changes at the bilateral lung apices. Redemonstration of treatment changes of the left upper lung, with architectural distortion, scarring, and pleural thickening persisting. Subpleural reticulation at the right lower lobe lung base. No confluent airspace disease. No pleural effusion. Review of the MIP images confirms the above findings. CT ABDOMEN and PELVIS FINDINGS Hepatobiliary: Redemonstration of the heterogeneous mass within the right liver, segment 8, with well-defined  border and a diameter of 2.7 cm, unchanged. Compared to prior CT there is a new satellite nodule extending inferior and lateral to this lesion on image 15 of series 4. Second hepatic lesion measuring 2.1 cm within segment 6 on image 22 of series 4, new. Third a P[place]ock lesion segment 6 at the tip of the liver, image 27 of series 4 Additionally there is a heterogeneously hypoattenuating/enhancing region of the left liver with what appears to be occlusion of the left hepatic vein (image 14 of series 4. Hyper dense material in the dependent aspect of the gallbladder, without inflammatory changes. Pancreas: Unremarkable Spleen: Unremarkable Adrenals/Urinary Tract: - Right adrenal gland:  Unremarkable - Left adrenal gland: Unremarkable. - Right kidney: No hydronephrosis, nephrolithiasis, inflammation, or ureteral dilation. Rounded lesions on the lateral cortex of the right kidney, most likely benign, unchanged - Left Kidney: No hydronephrosis, nephrolithiasis, inflammation, or ureteral dilation. No focal lesion. - Urinary Bladder: Unremarkable. Stomach/Bowel: - Stomach: Hiatal hernia with otherwise unremarkable stomach - Small bowel: Unremarkable - Appendix: Normal - Colon: Mild stool burden.  No focal inflammatory changes. Vascular/Lymphatic: Atherosclerotic calcifications of the abdominal aorta and bilateral iliac arteries. Proximal femoral arteries remain patent. No pelvic or abdominal adenopathy. Reproductive: Transverse diameter of the prostate measures 41 mm Other: Surgical changes along the midline abdomen with laxity in the midline. Small bowel loops are immediately subjacent to the midline laparotomy site Musculoskeletal: Degenerative changes of the thoracolumbar spine. Vacuum disc phenomenon spans all levels of the lumbar spine. Lytic lesion of T11 with sclerotic border. While this is unchanged in size from the most recent comparison CT, it is new from 2019 and in the region of the pathologically proven tumor  of the posterior mediastinum. Review of the MIP images confirms the above findings. IMPRESSION: CT negative for pulmonary emboli, with no acute finding to account for chest pain. Progression of hepatic malignancy, with at least 3 new hepatic lesions, separate from the previously biopsied lesion of the right liver. In addition there is questionable new infiltrative disease of the left liver which appears to occlude the left hepatic vein. Referral for follow-up with oncology is indicated, as well as  consideration of contrast-enhanced liver MRI. Treatment changes of the left upper lobe without evidence of local recurrence/progression. In addition, the T11 malignant lytic lesion and biopsy proven tumor of left posterior mediastinal/paravertebral T11 level are unchanged. Advanced emphysema.  Emphysema (ICD10-J43.9). Aortic atherosclerosis and iliac arterial disease. Aortic Atherosclerosis (ICD10-I70.0). Unchanged diameter of ascending aorta, 4.2 cm. Recommend annual imaging followup by CTA or MRA. This recommendation follows 2010 ACCF/AHA/AATS/ACR/ASA/SCA/SCAI/SIR/STS/SVM Guidelines for the Diagnosis and Management of Patients with Thoracic Aortic Disease. Circulation. 2010; 121: L892-J194. Aortic aneurysm NOS (ICD10-I71.9) Electronically Signed   By: Corrie Mckusick D.O.   On: 03/24/2019 08:53   NM Bone Scan Whole Body  Result Date: 04/14/2019 CLINICAL DATA:  Stage IV lung cancer.  No recent falls or trauma. EXAM: NUCLEAR MEDICINE WHOLE BODY BONE SCAN TECHNIQUE: Whole body anterior and posterior images were obtained approximately 3 hours after intravenous injection of radiopharmaceutical. RADIOPHARMACEUTICALS:  22.9 mCi Technetium-50mMDP IV COMPARISON:  03/26/2019 chest abdomen and pelvic CTs. FINDINGS: Degenerative changes of the left knee. No abnormal uptake within the axial or appendicular skeleton otherwise. Physiologic renal and bladder excretion. IMPRESSION: No findings of osseous metastasis. Electronically  Signed   By: KAbigail MiyamotoM.D.   On: 04/14/2019 09:54   CT Abdomen Pelvis W Contrast  Result Date: 03/26/2019 CLINICAL DATA:  Restaging left lung cancer. Hepatocellular carcinoma restaging. EXAM: CT CHEST, ABDOMEN, AND PELVIS WITH CONTRAST TECHNIQUE: Multidetector CT imaging of the chest, abdomen and pelvis was performed following the standard protocol during bolus administration of intravenous contrast. CONTRAST:  1078mOMNIPAQUE IOHEXOL 300 MG/ML  SOLN COMPARISON:  03/24/2019 and 11/29/2018 FINDINGS: CT CHEST FINDINGS Cardiovascular: Stable ascending thoracic aortic aneurysm, 4.2 cm in diameter. Coronary, aortic arch, and branch vessel atherosclerotic vascular disease. Prominence of the azygos vein. Mediastinum/Nodes: No pathologic adenopathy identified. Lungs/Pleura: Prominent emphysema. Bilateral airway thickening. Architectural distortion, scarring, and some chronic nodularity in the left upper lobe, much of which is likely therapy related, and not appreciably changed. Musculoskeletal: Stable mild left paraspinal density at the T11 level with residual erosion along the left anterior T11 vertebral body. The soft tissue density adjacent to T11 is difficult to completely separate from the adjacent azygos vein but measures about 1.6 by 1.5 cm on image 48/2, and minimally are erodes the adjacent left lateral cortex of T11. This is roughly stable from 03/24/2019 exam. CT ABDOMEN PELVIS FINDINGS Hepatobiliary: Sharply defined segment 8 mass with mild internal heterogeneity measures 3.1 by 2.9 cm, previously 3.2 by 2.9 cm. On the prior exam was query whether this lesion occluding part of the hepatic vein, certainly the lesion is closely associated with the right hepatic vein although definite occlusion is not observed. Adjacent rim enhancing lesion in segment 81.5 by 1.5 cm, stable. A segment 6 lesion with central washout and peripheral enhancement measures about 2.2 by 2.4 cm on image 65/2, stable. Ill-defined  hypodense lesion posteriorly in the right hepatic lobe approximately 0.7 cm in diameter on image 71/2. Overall the hepatic lesions are unchanged. 1.3 cm gallstone in the gallbladder. The gallbladder is mildly contracted. No biliary dilatation. Pancreas: Unremarkable Spleen: Unremarkable Adrenals/Urinary Tract: Both adrenal glands appear normal. Stable right renal hypodense lesions, likely cysts. Stomach/Bowel: Swirling of the small bowel mesentery in the central pelvis but without dilated bowel to suggest a significant volvulus. Stable postoperative findings along proximal sigmoid colon margin. Vascular/Lymphatic: Aortoiliac atherosclerotic vascular disease. No pathologic adenopathy identified. Reproductive: Speckled calcifications along the tunica albuginea the penis. Prostate gland unchanged. Other: No supplemental non-categorized findings. Musculoskeletal:  Bridging spurring of both sacroiliac joints. Grade 1 anterolisthesis at L5-S1. Lumbar spondylosis and degenerative disc disease causing multilevel impingement most striking at L5-S1. IMPRESSION: 1. Essentially stable appearance of the hepatic metastatic lesions, not unexpected given that prior exam was from 2 days ago. The left paraspinal soft tissue density at T11 is likewise stable. 2. Stable architectural distortion, scarring, and chronic nodularity in the left upper lobe, much of which is likely therapy related. 3. Swirling of the small bowel mesenteric vessels in the pelvis, but no dilated bowel to suggest a significant volvulus. 4. Other imaging findings of potential clinical significance: Airway thickening is present, suggesting bronchitis or reactive airways disease. Stable ascending thoracic aortic aneurysm, 4.2 cm in diameter. Cholelithiasis. Lumbar spondylosis and degenerative disc disease causing multilevel impingement most striking at L5-S1. Bridging spurring of both sacroiliac joints. Aortic Atherosclerosis (ICD10-I70.0) and Emphysema  (ICD10-J43.9). Electronically Signed   By: Van Clines M.D.   On: 03/26/2019 10:58   CT ABDOMEN PELVIS W CONTRAST  Result Date: 03/24/2019 CLINICAL DATA:  69 year old male with chest pain. History of hepatocellular carcinoma/hepatocellular adenocarcinoma with a positive biopsy in the posterior mediastinum. Subsequent biopsy of liver mass was negative. EXAM: CT ANGIOGRAPHY CHEST CT ABDOMEN AND PELVIS WITH CONTRAST TECHNIQUE: Multidetector CT imaging of the chest was performed using the standard protocol during bolus administration of intravenous contrast. Multiplanar CT image reconstructions and MIPs were obtained to evaluate the vascular anatomy. Multidetector CT imaging of the abdomen and pelvis was performed using the standard protocol during bolus administration of intravenous contrast. CONTRAST:  84m OMNIPAQUE IOHEXOL 350 MG/ML SOLN COMPARISON:  11/29/2018, CT 06/18/2014, 07/26/2017, 06/30/2018 FINDINGS: CTA CHEST FINDINGS Cardiovascular: Heart: Heart size unchanged. No pericardial fluid/thickening. Calcifications of the left main, left anterior descending, circumflex, right coronary arteries. Aorta: Greatest diameter of the ascending aorta estimated 42 mm which is unchanged from the comparison. Opacification of the aorta it is limited given the timing of the contrast bolus for the pulmonary arteries. Common origin of the innominate artery an the left common carotid artery. Mild atherosclerosis of the aortic arch. Pulmonary arteries: No central, lobar, segmental, or proximal subsegmental filling defects. Pulmonary arteries are attenuated within the left upper lobe secondary to treatment effect. Mediastinum/Nodes: No mediastinal adenopathy. Unremarkable appearance of the thoracic esophagus. Nodularity of the posterior mediastinum is significantly decreased as compared to the pre treatment CT of 06/30/2018. Unremarkable thoracic inlet. Lungs/Pleura: Advanced paraseptal and centrilobular emphysema,  including bullous changes at the bilateral lung apices. Redemonstration of treatment changes of the left upper lung, with architectural distortion, scarring, and pleural thickening persisting. Subpleural reticulation at the right lower lobe lung base. No confluent airspace disease. No pleural effusion. Review of the MIP images confirms the above findings. CT ABDOMEN and PELVIS FINDINGS Hepatobiliary: Redemonstration of the heterogeneous mass within the right liver, segment 8, with well-defined border and a diameter of 2.7 cm, unchanged. Compared to prior CT there is a new satellite nodule extending inferior and lateral to this lesion on image 15 of series 4. Second hepatic lesion measuring 2.1 cm within segment 6 on image 22 of series 4, new. Third a [place] lesion segment 6 at the tip of the liver, image 27 of series 4 Additionally there is a heterogeneously hypoattenuating/enhancing region of the left liver with what appears to be occlusion of the left hepatic vein (image 14 of series 4. Hyper dense material in the dependent aspect of the gallbladder, without inflammatory changes. Pancreas: Unremarkable Spleen: Unremarkable Adrenals/Urinary Tract: - Right adrenal gland:  Unremarkable - Left adrenal gland: Unremarkable. - Right kidney: No hydronephrosis, nephrolithiasis, inflammation, or ureteral dilation. Rounded lesions on the lateral cortex of the right kidney, most likely benign, unchanged - Left Kidney: No hydronephrosis, nephrolithiasis, inflammation, or ureteral dilation. No focal lesion. - Urinary Bladder: Unremarkable. Stomach/Bowel: - Stomach: Hiatal hernia with otherwise unremarkable stomach - Small bowel: Unremarkable - Appendix: Normal - Colon: Mild stool burden.  No focal inflammatory changes. Vascular/Lymphatic: Atherosclerotic calcifications of the abdominal aorta and bilateral iliac arteries. Proximal femoral arteries remain patent. No pelvic or abdominal adenopathy. Reproductive: Transverse  diameter of the prostate measures 41 mm Other: Surgical changes along the midline abdomen with laxity in the midline. Small bowel loops are immediately subjacent to the midline laparotomy site Musculoskeletal: Degenerative changes of the thoracolumbar spine. Vacuum disc phenomenon spans all levels of the lumbar spine. Lytic lesion of T11 with sclerotic border. While this is unchanged in size from the most recent comparison CT, it is new from 2019 and in the region of the pathologically proven tumor of the posterior mediastinum. Review of the MIP images confirms the above findings. IMPRESSION: CT negative for pulmonary emboli, with no acute finding to account for chest pain. Progression of hepatic malignancy, with at least 3 new hepatic lesions, separate from the previously biopsied lesion of the right liver. In addition there is questionable new infiltrative disease of the left liver which appears to occlude the left hepatic vein. Referral for follow-up with oncology is indicated, as well as consideration of contrast-enhanced liver MRI. Treatment changes of the left upper lobe without evidence of local recurrence/progression. In addition, the T11 malignant lytic lesion and biopsy proven tumor of left posterior mediastinal/paravertebral T11 level are unchanged. Advanced emphysema.  Emphysema (ICD10-J43.9). Aortic atherosclerosis and iliac arterial disease. Aortic Atherosclerosis (ICD10-I70.0). Unchanged diameter of ascending aorta, 4.2 cm. Recommend annual imaging followup by CTA or MRA. This recommendation follows 2010 ACCF/AHA/AATS/ACR/ASA/SCA/SCAI/SIR/STS/SVM Guidelines for the Diagnosis and Management of Patients with Thoracic Aortic Disease. Circulation. 2010; 121: I712-W580. Aortic aneurysm NOS (ICD10-I71.9) Electronically Signed   By: Corrie Mckusick D.O.   On: 03/24/2019 08:53   PERIPHERAL VASCULAR CATHETERIZATION  Result Date: 04/10/2019 See Op Note  US BIOPSY (LIVER)  Result Date:  03/30/2019 INDICATION: History of lung cancer and hepatocellular carcinoma. New right inferior hepatic lesion EXAM: ULTRASOUND GUIDED CORE BIOPSY OF NEW RIGHT INFERIOR HEPATIC LESION MEDICATIONS: 1% LIDOCAINE LOCAL ANESTHESIA/SEDATION: Fentanyl 100 mcg IV; Versed 2.0 mg IV Moderate Sedation Time:  13 MINUTES The patient was continuously monitored during the procedure by the interventional radiology nurse under my direct supervision. PROCEDURE: The procedure, risks, benefits, and alternatives were explained to the patient. Questions regarding the procedure were encouraged and answered. The patient understands and consents to the procedure. The right anterior subcostal area was prepped with ChloraPrep in a sterile fashion, and a sterile drape was applied covering the operative field. A sterile gown and sterile gloves were used for the procedure. Local anesthesia was provided with 1% Lidocaine. previous imaging reviewed. Preliminary ultrasound performed of the right hepatic lobe from a right upper quadrant subcostal approach. The right inferior hepatic lesion was localized and marked. Under sterile conditions and local anesthesia, a 17 gauge coaxial guide was advanced to the lesion. Images obtained for documentation. 2 18 gauge core biopsies obtained. Samples were intact and non fragmented. These were placed in formalin. Needle removed. Needle tract occluded with Gel-Foam. Postprocedure imaging demonstrates no hemorrhage or hematoma. Patient tolerated biopsy well. COMPLICATIONS: None immediate. FINDINGS: Imaging confirms needle placement  in the right inferior hepatic lesion for core biopsy IMPRESSION: Successful ultrasound right inferior hepatic lesion 18 gauge core biopsies Electronically Signed   By: Jerilynn Mages.  Shick M.D.   On: 03/30/2019 10:34   DG Chest Port 1 View  Result Date: 03/24/2019 CLINICAL DATA:  Acute chest pain. Patient with LEFT lung cancer. EXAM: PORTABLE CHEST 1 VIEW COMPARISON:  08/01/2017 FINDINGS:  Cardiomediastinal silhouette is unchanged. Scarring within the UPPER LEFT lung again noted. Emphysema again identified. There is no evidence of focal airspace disease, pulmonary edema, suspicious pulmonary nodule/mass, pleural effusion, or pneumothorax. No acute bony abnormalities are identified. IMPRESSION: No evidence of acute cardiopulmonary disease. Electronically Signed   By: Margarette Canada M.D.   On: 03/24/2019 07:39      ASSESSMENT & PLAN:  1. Stage IV adenocarcinoma of lung, left ([place])   2. Hepatocellular carcinoma metastatic to left lung ([place])   3. Neoplasm related pain   4. Bone lesion   5. Encounter for antineoplastic chemotherapy   6. Encounter for antineoplastic immunotherapy    #Stage IV Lung adenocarcinoma, Stage IV HCC-07/17/2018 paraspinal soft tissue biopsy Status post cycle 1 carbo/Taxol/Avastin/Tecentriq. Patient is clinically doing well. Labs are reviewed and discussed with patient. Proceed with cycle 2 carboplatin/Taxol/Avastin/Tecentriq.   #Neoplasm related pain, continue fentanyl patch 25 MCG/hour every 72 hours.  Continue oxycodone 5 mg every 8 hours as needed for breakthrough pain.  I reviewed 30 tablets of oxycodone and prescription was sent to pharmacy. #Sclerotic T11 lesion.  Continue Zometa every 4 to 6 weeks.  Patient declines taking calcium supplementation as it causes rash.   Status post Zometa 04/30/2019.  Plan Zometa at the next visit.  #Continue follow-up with palliative care service. We spent sufficient time to discuss many aspect of care, questions were answered to patient's satisfaction. All questions were answered. The patient knows to call the clinic with any problems questions or concerns.   Earlie Server, MD, PhD 05/09/2019

## 2019-05-10 LAB — AFP TUMOR MARKER: AFP, Serum, Tumor Marker: 10.9 ng/mL — ABNORMAL HIGH (ref 0.0–8.3)

## 2019-05-14 ENCOUNTER — Emergency Department: Payer: Medicare Other

## 2019-05-14 ENCOUNTER — Other Ambulatory Visit: Payer: Self-pay

## 2019-05-14 ENCOUNTER — Emergency Department
Admission: EM | Admit: 2019-05-14 | Discharge: 2019-05-14 | Disposition: A | Discharge status: Home / Self Care | Payer: Medicare Other | Attending: Emergency Medicine | Admitting: Emergency Medicine

## 2019-05-14 ENCOUNTER — Encounter: Payer: Self-pay | Admitting: Emergency Medicine

## 2019-05-14 DIAGNOSIS — R140 Abdominal distension (gaseous): Secondary | ICD-10-CM | POA: Diagnosis not present

## 2019-05-14 DIAGNOSIS — R531 Weakness: Secondary | ICD-10-CM | POA: Diagnosis present

## 2019-05-14 DIAGNOSIS — E869 Volume depletion, unspecified: Secondary | ICD-10-CM | POA: Diagnosis not present

## 2019-05-14 DIAGNOSIS — I10 Essential (primary) hypertension: Secondary | ICD-10-CM | POA: Insufficient documentation

## 2019-05-14 DIAGNOSIS — Z87891 Personal history of nicotine dependence: Secondary | ICD-10-CM | POA: Insufficient documentation

## 2019-05-14 DIAGNOSIS — K5900 Constipation, unspecified: Secondary | ICD-10-CM | POA: Insufficient documentation

## 2019-05-14 DIAGNOSIS — Z79899 Other long term (current) drug therapy: Secondary | ICD-10-CM | POA: Insufficient documentation

## 2019-05-14 DIAGNOSIS — Z85118 Personal history of other malignant neoplasm of bronchus and lung: Secondary | ICD-10-CM | POA: Insufficient documentation

## 2019-05-14 DIAGNOSIS — C7951 Secondary malignant neoplasm of bone: Secondary | ICD-10-CM | POA: Diagnosis not present

## 2019-05-14 LAB — CBC WITH DIFFERENTIAL/PLATELET
Abs Immature Granulocytes: 0.01 10*3/uL (ref 0.00–0.07)
Basophils Absolute: 0 10*3/uL (ref 0.0–0.1)
Basophils Relative: 0 %
Eosinophils Absolute: 0.2 10*3/uL (ref 0.0–0.5)
Eosinophils Relative: 9 %
HCT: 44.5 % (ref 39.0–52.0)
Hemoglobin: 15.2 g/dL (ref 13.0–17.0)
Immature Granulocytes: 0 %
Lymphocytes Relative: 31 %
Lymphs Abs: 0.8 10*3/uL (ref 0.7–4.0)
MCH: 30.5 pg (ref 26.0–34.0)
MCHC: 34.2 g/dL (ref 30.0–36.0)
MCV: 89.2 fL (ref 80.0–100.0)
Monocytes Absolute: 0 10*3/uL — ABNORMAL LOW (ref 0.1–1.0)
Monocytes Relative: 0 %
Neutro Abs: 1.5 10*3/uL — ABNORMAL LOW (ref 1.7–7.7)
Neutrophils Relative %: 60 %
Platelets: 201 10*3/uL (ref 150–400)
RBC: 4.99 MIL/uL (ref 4.22–5.81)
RDW: 13.5 % (ref 11.5–15.5)
WBC: 2.5 10*3/uL — ABNORMAL LOW (ref 4.0–10.5)
nRBC: 0 % (ref 0.0–0.2)

## 2019-05-14 LAB — BASIC METABOLIC PANEL
Anion gap: 11 (ref 5–15)
BUN: 19 mg/dL (ref 8–23)
CO2: 28 mmol/L (ref 22–32)
Calcium: 9.2 mg/dL (ref 8.9–10.3)
Chloride: 100 mmol/L (ref 98–111)
Creatinine, Ser: 0.73 mg/dL (ref 0.61–1.24)
GFR calc Af Amer: >60 mL/min (ref >60)
GFR calc non Af Amer: >60 mL/min (ref >60)
Glucose, Bld: 96 mg/dL (ref 70–99)
Potassium: 3.2 mmol/L — ABNORMAL LOW (ref 3.5–5.1)
Sodium: 139 mmol/L (ref 135–145)

## 2019-05-14 LAB — MAGNESIUM: Magnesium: 2 mg/dL (ref 1.7–2.4)

## 2019-05-14 MED ORDER — LACTATED RINGERS IV BOLUS
1000.0000 mL | Freq: Once | INTRAVENOUS | Status: AC
  Administered 2019-05-14: 1000 mL via INTRAVENOUS

## 2019-05-14 MED ORDER — POTASSIUM CHLORIDE CRYS ER 20 MEQ PO TBCR
40.0000 meq | EXTENDED_RELEASE_TABLET | Freq: Once | ORAL | Status: AC
  Administered 2019-05-14: 40 meq via ORAL
  Filled 2019-05-14: qty 2

## 2019-05-14 MED ORDER — LACTULOSE 10 GM/15ML PO SOLN
20.0000 g | Freq: Once | ORAL | Status: AC
  Administered 2019-05-14: 20 g via ORAL
  Filled 2019-05-14: qty 30

## 2019-05-14 MED ORDER — POTASSIUM CHLORIDE CRYS ER 20 MEQ PO TBCR: 20.0000 meq | EXTENDED_RELEASE_TABLET | Freq: Every day | ORAL | 0 refills | Status: DC

## 2019-05-14 MED ORDER — POLYETHYLENE GLYCOL 3350 17 G PO PACK: 17.0000 g | PACK | Freq: Every day | ORAL | 0 refills | Status: DC

## 2019-05-14 MED ORDER — MAGNESIUM SULFATE 2 GM/50ML IV SOLN
2.0000 g | Freq: Once | INTRAVENOUS | Status: AC
  Administered 2019-05-14: 2 g via INTRAVENOUS
  Filled 2019-05-14: qty 50

## 2019-05-14 NOTE — ED Provider Notes (Signed)
[facility] Emergency Department Provider Note  ____________________________________________   First MD Initiated Contact with Patient 05/14/19 0615     (approximate)  I have reviewed the triage vital signs and the nursing notes.   HISTORY  Chief Complaint Dehydration    HPI Samuel Hood. is a 69 y.o. male with extensive past medical history that, according to the oncology records, includes both metastatic hepatocellular carcinoma  and stage IV adenocarcinoma of the lung.  He is currently undergoing chemotherapy under the care of Dr. Tasia Catchings.  He had chemotherapy a few days ago and presents tonight because he feels generalized weakness and is concerned he may be dehydrated.  He also said that Dr. Tasia Catchings told him he needs to have a bowel movement every day while he is getting chemotherapy and he has not had one for several days.  He said that he is taking his laxative (senna and docusate together) and has taken 3-4 the pills but nothing is helping.  He feels a little bit bloated but is not having any abdominal pain.  He takes chronic narcotics due to the cancer pain.  He denies fever/chills, chest pain, shortness of breath, cough, nausea, and vomiting.  He says he has been eating and drinking normally.  He describes the symptoms as severe and nothing in particular makes them better or worse.        Past Medical History:  Diagnosis Date  . Hepatocellular carcinoma metastatic to left lung ([place]) 07/24/2018  . History of angiography    left lower extremity  . Hypertension   . Small bowel obstruction [facility])     Patient Active Problem List   Diagnosis Date Noted  . Bone lesion 04/06/2019  . Cancer, metastatic to bone ([place]) 12/16/2018  . Orthostatic hypotension 10/26/2018  . Encounter for antineoplastic chemotherapy 10/05/2018  . Stage IV adenocarcinoma of lung, left ([place]) 09/02/2018  . Chronic active hepatitis ([place]) 07/28/2018  . Elevated PSA 07/28/2018  .  Hepatocellular carcinoma metastatic to left lung ([place]) 07/24/2018  . Paraspinal mass 07/24/2018  . Goals of care, counseling/discussion 07/07/2018  . Liver mass 06/23/2018  . Lung nodule 06/23/2018  . Chronic bullous emphysema ([place]) 06/23/2018  . Hypertension 03/26/2016  . Small bowel obstruction ([place])   . Intestinal obstruction ([place]) 09/26/2014  . Essential hypertension 06/19/2014  . Hypokalemia 06/19/2014  . Bowel obstruction ([place]) 06/18/2014    Past Surgical History:  Procedure Laterality Date  . COLON SURGERY    . COLONOSCOPY W/ POLYPECTOMY    . COLONOSCOPY WITH PROPOFOL N/A 01/31/2018   Procedure: COLONOSCOPY WITH PROPOFOL;  Surgeon: Toledo, Benay Pike, MD;  Location: [facility] ENDOSCOPY;  Service: Gastroenterology;  Laterality: N/A;  . debridement fasciotomy leg, left Left 03/19/2016  . LAPAROTOMY N/A 09/27/2014   Procedure: EXPLORATORY LAPAROTOMY;  Surgeon: Dia Crawford III, MD;  Location: [facility] ORS;  Service: General;  Laterality: N/A;  . PORTA CATH INSERTION N/A 04/10/2019   Procedure: PORTA CATH INSERTION;  Surgeon: Katha Cabal, MD;  Location: [place] CV LAB;  Service: Cardiovascular;  Laterality: N/A;    Prior to Admission medications   Medication Sig Start Date End Date Taking? Authorizing Provider  amLODipine (NORVASC) 5 MG tablet Take 1 tablet (5 mg total) by mouth daily. 04/16/19 04/15/20  Earlie Server, MD  dexamethasone (DECADRON) 4 MG tablet Take 2 tablets (8 mg total) by mouth daily. Start the day after carboplatin chemotherapy for 3 days. Patient not taking: Reported on 04/13/2019 04/06/19  Earlie Server, MD  fentaNYL (DURAGESIC) 25 MCG/HR Place 1 patch onto the skin every 3 (three) days. 05/01/19   Borders, Kirt Boys, NP  lidocaine-prilocaine (EMLA) cream Apply to affected area once Patient taking differently: Apply 1 application topically daily as needed (port acess).  04/06/19   Earlie Server, MD  loperamide (IMODIUM) 2 MG capsule Take 1 capsule (2 mg total) by mouth See admin  instructions. With onset of loose stool, take 38m followed by 268mevery 2 hours until 12 hours have passed without loose bowel movement. Maximum: 16 mg/day Patient not taking: Reported on 04/23/2019 09/08/18   YuEarlie ServerMD  Multiple Vitamin (MULTIVITAMIN) capsule Take 1 capsule by mouth daily.    [provider]  NARCAN 4 MG/0.1ML LIQD nasal spray kit 1 spray once. 04/12/19   [provider]  ondansetron (ZOFRAN) 8 MG tablet Take 1 tablet (8 mg total) by mouth 2 (two) times daily as needed for refractory nausea / vomiting. Start on day 3 after carboplatin chemo. Patient not taking: Reported on 04/13/2019 04/06/19   YuEarlie ServerMD  oxyCODONE (ROXICODONE) 5 MG immediate release tablet Take 1 tablet (5 mg total) by mouth every 8 (eight) hours as needed for severe pain or breakthrough pain. 05/09/19   YuEarlie ServerMD  polyethylene glycol (MIRALAX / GLYCOLAX) 17 g packet Take 17 g by mouth daily. 05/14/19   FoHinda KehrMD  potassium chloride SA (KLOR-CON M20) 20 MEQ tablet Take 1 tablet (20 mEq total) by mouth daily. 05/14/19   FoHinda KehrMD  prochlorperazine (COMPAZINE) 10 MG tablet Take 1 tablet (10 mg total) by mouth every 6 (six) hours as needed (Nausea or vomiting). Patient not taking: Reported on 04/23/2019 04/06/19   YuEarlie ServerMD  senna (SENOKOT) 8.6 MG TABS tablet Take 1 tablet (8.6 mg total) by mouth daily. Patient not taking: Reported on 05/09/2019 10/12/18   BuJacquelin HawkingNP    Allergies 5-alpha reductase inhibitors  Family History  Problem Relation Age of Onset  . Cancer Mother   . Cancer Father     Social History Social History   Tobacco Use  . Smoking status: Former Smoker    Packs/day: 0.25    Years: 50.00    Pack years: 12.50  . Smokeless tobacco: Never Used  . Tobacco comment: 1-2/week  Substance Use Topics  . Alcohol use: Not Currently    Alcohol/week: 35.0 standard drinks    Types: 30 Cans of beer, 5 Shots of liquor per week  . Drug use: No    Review  of Systems Constitutional: Generalized weakness.  No fever/chills. Eyes: No visual changes. ENT: No sore throat. Cardiovascular: Denies chest pain. Respiratory: Denies shortness of breath. Gastrointestinal: Constipation and mild abdominal distention.  No abdominal pain.  No nausea nor vomiting. Genitourinary: Negative for dysuria. Musculoskeletal: Negative for neck pain.  Negative for back pain. Integumentary: Negative for rash. Neurological: Generalized weakness.  Negative for headaches, focal weakness or numbness.   ____________________________________________   PHYSICAL EXAM:  VITAL SIGNS: ED Triage Vitals  Enc Vitals Group     BP 05/14/19 0517 120/83     Pulse Rate 05/14/19 0517 (!) 109     Resp 05/14/19 0517 18     Temp 05/14/19 0517 97.7 F (36.5 C)     Temp Source 05/14/19 0517 Oral     SpO2 05/14/19 0517 95 %     Weight 05/14/19 0518 60.3 kg (133 lb)     Height 05/14/19 0518 1.651  m ('5\' 5"'$ )     Head Circumference --      Peak Flow --      Pain Score --      Pain Loc --      Pain Edu? --      Excl. in [place]? --     Constitutional: Alert and oriented.  No acute distress at this time. Eyes: Conjunctivae are normal.  Head: Atraumatic. Nose: No congestion/rhinnorhea. Mouth/Throat: Patient is wearing a mask. Neck: No stridor.  No meningeal signs.   Cardiovascular: Normal rate, regular rhythm. Good peripheral circulation. Grossly normal heart sounds. Respiratory: Normal respiratory effort.  No retractions. Gastrointestinal: Soft and nontender. No distention that I can appreciate on exam. Musculoskeletal: No lower extremity tenderness nor edema. No gross deformities of extremities. Neurologic:  Normal speech and language. No gross focal neurologic deficits are appreciated.  Skin:  Skin is warm, dry and intact. Psychiatric: Mood and affect are normal. Speech and behavior are normal.  ____________________________________________   LABS (all labs ordered are listed,  but only abnormal results are displayed)  Labs Reviewed  CBC WITH DIFFERENTIAL/PLATELET - Abnormal; Notable for the following components:      Result Value   WBC 2.5 (*)    Neutro Abs 1.5 (*)    Monocytes Absolute 0.0 (*)    All other components within normal limits  BASIC METABOLIC PANEL - Abnormal; Notable for the following components:   Potassium 3.2 (*)    All other components within normal limits  MAGNESIUM   ____________________________________________  EKG  No indication for emergent EKG ____________________________________________  RADIOLOGY I, Hinda Kehr, personally viewed and evaluated these images (plain radiographs) as part of my medical decision making, as well as reviewing the written report by the radiologist.  ED MD interpretation:  Non-obstructive pattern  Official radiology report(s): DG Abdomen Acute W/Chest  Result Date: 05/14/2019 CLINICAL DATA:  Abdominal distension and constipation EXAM: DG ABDOMEN ACUTE W/ 1V CHEST COMPARISON:  03/26/2019 CT FINDINGS: Normal heart size and stable mediastinal contours. Post treatment scarring in the left upper lobe. There is no edema, consolidation, effusion, or pneumothorax. Porta catheter with tip at the upper SVC. Moderate stool volume seen along the flanks. No worrisome bowel dilatation. No concerning mass effect or gas collection. Lumbar spine degeneration IMPRESSION: Nonobstructive bowel gas pattern with moderate stool volume. Electronically Signed   By: Monte Fantasia M.D.   On: 05/14/2019 07:16    ____________________________________________   PROCEDURES   Procedure(s) performed (including Critical Care):  Procedures   ____________________________________________   INITIAL IMPRESSION / MDM / [place] / ED COURSE  As part of my medical decision making, I reviewed the following data within the [facility] notes reviewed and incorporated, Labs reviewed , Old chart  reviewed, Radiograph reviewed , Discussed with oncologist (Dr. Tasia Catchings) and reviewed Notes from prior ED visits   Differential diagnosis includes, but is not limited to, volume depletion/dehydration, generalized weakness secondary to recent chemotherapy, constipation, SBO/ileus, infection.  The patient is afebrile and he was initially mildly tachycardic upon arrival but that resolved by the time I saw him.  He says he has been eating and drinking but I suspect he is eating and drinking less than he should.  Lab results are notable for an essentially normal basic metabolic panel except for decreased potassium which certainly could contribute to his constipation as well as the chronic narcotics.  His CBC is normal except for a leukopenia but his [place] calculates as  1500 cells per microliter and does not demonstrate neutropenia.  The patient declines a rectal exam with manual disimpaction.  I will give him 1 L of lactated Ringer's, 40 mEq of potassium by mouth, 2 g of magnesium IV to help with potassium absorption, lactulose 20 g by mouth to help with constipation, and check an acute abdomen series to look for any evidence of air-fluid levels or bowel dilatation.  Anticipate that he should be able to be discharged home in the absence of any emergent findings but I also try to touch base with Dr. Tasia Catchings with oncology.      Clinical Course as of May 14 726  Mon May 14, 2019  0628 ANC = 1500 cells/microliter   [CF]  0718 Magnesium: 2.0 [CF]  3790 Nonobstructive bowel gas pattern.  I also spoke by phone with Dr. Tasia Catchings with hematology oncology.  She knows the patient well and agreed with my plan is that she would follow-up with him as an outpatient but agrees that there is no need for him to stay in the hospital based on the current evaluation and work-up.  I will update the patient and plan for discharge after he receives his IV fluids, lactulose, magnesium, and potassium.   [CF]    Clinical Course User Index [CF]  Hinda Kehr, MD     ____________________________________________  FINAL CLINICAL IMPRESSION(S) / ED DIAGNOSES  Final diagnoses:  Volume depletion  Constipation, unspecified constipation type     MEDICATIONS GIVEN DURING THIS VISIT:  Medications  magnesium sulfate IVPB 2 g 50 mL (2 g Intravenous New Bag/Given 05/14/19 0710)  lactated ringers bolus 1,000 mL (1,000 mLs Intravenous New Bag/Given 05/14/19 0709)  lactulose (CHRONULAC) 10 GM/15ML solution 20 g (20 g Oral Given 05/14/19 0708)  potassium chloride SA (KLOR-CON) CR tablet 40 mEq (40 mEq Oral Given 05/14/19 0708)     ED Discharge Orders         Ordered    potassium chloride SA (KLOR-CON M20) 20 MEQ tablet  Daily     05/14/19 0724    polyethylene glycol (MIRALAX / GLYCOLAX) 17 g packet  Daily     05/14/19 0727          *Please note:  Samuel Termini. was evaluated in Emergency Department on 05/14/2019 for the symptoms described in the history of present illness. He was evaluated in the context of the global COVID-19 pandemic, which necessitated consideration that the patient might be at risk for infection with the SARS-CoV-2 virus that causes COVID-19. Institutional protocols and algorithms that pertain to the evaluation of patients at risk for COVID-19 are in a state of rapid change based on information released by regulatory bodies including the CDC and federal and state organizations. These policies and algorithms were followed during the patient's care in the ED.  Some ED evaluations and interventions may be delayed as a result of limited staffing during the pandemic.*  Note:  This document was prepared using Dragon voice recognition software and may include unintentional dictation errors.   Hinda Kehr, MD 05/14/19 [phone]

## 2019-05-14 NOTE — ED Triage Notes (Signed)
Pt arrives POV to triage with c/o dehydration and constipation. Pt is a current cancer pt and had chemotherapy x 3 days ago. Pt is in NAD.

## 2019-05-14 NOTE — Discharge Instructions (Signed)

## 2019-05-14 NOTE — ED Notes (Signed)
Pt verbalized understanding of discharge instructions. NAD at this time. 

## 2019-05-16 ENCOUNTER — Telehealth: Payer: Self-pay

## 2019-05-16 NOTE — Telephone Encounter (Signed)
Thank you :)

## 2019-05-16 NOTE — Telephone Encounter (Signed)
Contacted patient to follow up on ED visit and chemo treatment. Pt reports that he is eating better and no longer constipated. He has been regular bowel movements. He reports that after he had last chemo his hands have been peeling and itching. He is rubbing a cream on them to moisturize hands.

## 2019-05-23 NOTE — Progress Notes (Signed)
Pharmacist Chemotherapy Monitoring - Follow Up Assessment    I verify that I have reviewed each item in the below checklist:  . Regimen for the patient is scheduled for the appropriate day and plan matches scheduled date. Marland Kitchen Appropriate non-routine labs are ordered dependent on drug ordered. . If applicable, additional medications reviewed and ordered per protocol based on lifetime cumulative doses and/or treatment regimen.   Plan for follow-up and/or issues identified: No . I-vent associated with next due treatment: No . MD and/or nursing notified: No  Slayden Mennenga K 05/23/2019 8:19 AM

## 2019-05-30 ENCOUNTER — Encounter: Payer: Self-pay | Admitting: Oncology

## 2019-05-30 ENCOUNTER — Other Ambulatory Visit: Payer: Self-pay

## 2019-05-30 ENCOUNTER — Inpatient Hospital Stay: Payer: Medicare Other

## 2019-05-30 ENCOUNTER — Inpatient Hospital Stay (HOSPITAL_BASED_OUTPATIENT_CLINIC_OR_DEPARTMENT_OTHER): Payer: Medicare Other | Admitting: Oncology

## 2019-05-30 ENCOUNTER — Inpatient Hospital Stay: Payer: Medicare Other | Attending: Oncology

## 2019-05-30 VITALS — BP 113/86 | HR 107 | Temp 97.1°F | Resp 16 | Wt 139.0 lb

## 2019-05-30 DIAGNOSIS — M899 Disorder of bone, unspecified: Secondary | ICD-10-CM

## 2019-05-30 DIAGNOSIS — E876 Hypokalemia: Secondary | ICD-10-CM | POA: Insufficient documentation

## 2019-05-30 DIAGNOSIS — R100 Acute abdomen: Secondary | ICD-10-CM | POA: Diagnosis not present

## 2019-05-30 DIAGNOSIS — C7951 Secondary malignant neoplasm of bone: Secondary | ICD-10-CM

## 2019-05-30 DIAGNOSIS — C7802 Secondary malignant neoplasm of left lung: Secondary | ICD-10-CM

## 2019-05-30 DIAGNOSIS — K449 Diaphragmatic hernia without obstruction or gangrene: Secondary | ICD-10-CM | POA: Diagnosis not present

## 2019-05-30 DIAGNOSIS — Z5111 Encounter for antineoplastic chemotherapy: Secondary | ICD-10-CM

## 2019-05-30 DIAGNOSIS — J439 Emphysema, unspecified: Secondary | ICD-10-CM | POA: Insufficient documentation

## 2019-05-30 DIAGNOSIS — M47816 Spondylosis without myelopathy or radiculopathy, lumbar region: Secondary | ICD-10-CM | POA: Insufficient documentation

## 2019-05-30 DIAGNOSIS — K5900 Constipation, unspecified: Secondary | ICD-10-CM | POA: Insufficient documentation

## 2019-05-30 DIAGNOSIS — R160 Hepatomegaly, not elsewhere classified: Secondary | ICD-10-CM | POA: Insufficient documentation

## 2019-05-30 DIAGNOSIS — I712 Thoracic aortic aneurysm, without rupture: Secondary | ICD-10-CM | POA: Diagnosis not present

## 2019-05-30 DIAGNOSIS — M5137 Other intervertebral disc degeneration, lumbosacral region: Secondary | ICD-10-CM | POA: Diagnosis not present

## 2019-05-30 DIAGNOSIS — R0602 Shortness of breath: Secondary | ICD-10-CM | POA: Diagnosis not present

## 2019-05-30 DIAGNOSIS — Z809 Family history of malignant neoplasm, unspecified: Secondary | ICD-10-CM | POA: Insufficient documentation

## 2019-05-30 DIAGNOSIS — K8020 Calculus of gallbladder without cholecystitis without obstruction: Secondary | ICD-10-CM | POA: Insufficient documentation

## 2019-05-30 DIAGNOSIS — C3492 Malignant neoplasm of unspecified part of left bronchus or lung: Secondary | ICD-10-CM | POA: Diagnosis not present

## 2019-05-30 DIAGNOSIS — Z79899 Other long term (current) drug therapy: Secondary | ICD-10-CM | POA: Insufficient documentation

## 2019-05-30 DIAGNOSIS — I10 Essential (primary) hypertension: Secondary | ICD-10-CM | POA: Diagnosis not present

## 2019-05-30 DIAGNOSIS — Z87891 Personal history of nicotine dependence: Secondary | ICD-10-CM | POA: Insufficient documentation

## 2019-05-30 DIAGNOSIS — M25512 Pain in left shoulder: Secondary | ICD-10-CM | POA: Diagnosis not present

## 2019-05-30 DIAGNOSIS — C220 Liver cell carcinoma: Secondary | ICD-10-CM

## 2019-05-30 DIAGNOSIS — R079 Chest pain, unspecified: Secondary | ICD-10-CM | POA: Insufficient documentation

## 2019-05-30 DIAGNOSIS — J948 Other specified pleural conditions: Secondary | ICD-10-CM | POA: Insufficient documentation

## 2019-05-30 DIAGNOSIS — G893 Neoplasm related pain (acute) (chronic): Secondary | ICD-10-CM | POA: Diagnosis not present

## 2019-05-30 DIAGNOSIS — R140 Abdominal distension (gaseous): Secondary | ICD-10-CM | POA: Diagnosis not present

## 2019-05-30 DIAGNOSIS — Z7952 Long term (current) use of systemic steroids: Secondary | ICD-10-CM | POA: Insufficient documentation

## 2019-05-30 DIAGNOSIS — Z5112 Encounter for antineoplastic immunotherapy: Secondary | ICD-10-CM | POA: Insufficient documentation

## 2019-05-30 DIAGNOSIS — B1920 Unspecified viral hepatitis C without hepatic coma: Secondary | ICD-10-CM | POA: Diagnosis not present

## 2019-05-30 DIAGNOSIS — R5383 Other fatigue: Secondary | ICD-10-CM | POA: Insufficient documentation

## 2019-05-30 DIAGNOSIS — Z596 Low income: Secondary | ICD-10-CM | POA: Insufficient documentation

## 2019-05-30 DIAGNOSIS — J984 Other disorders of lung: Secondary | ICD-10-CM | POA: Insufficient documentation

## 2019-05-30 LAB — URINALYSIS, DIPSTICK ONLY
Bilirubin Urine: NEGATIVE
Glucose, UA: NEGATIVE mg/dL
Hgb urine dipstick: NEGATIVE
Ketones, ur: NEGATIVE mg/dL
Leukocytes,Ua: NEGATIVE
Nitrite: NEGATIVE
Protein, ur: NEGATIVE mg/dL
Specific Gravity, Urine: 1.01 (ref 1.005–1.030)
pH: 6 (ref 5.0–8.0)

## 2019-05-30 LAB — CBC WITH DIFFERENTIAL/PLATELET
Abs Immature Granulocytes: 0.1 10*3/uL — ABNORMAL HIGH (ref 0.00–0.07)
Basophils Absolute: 0 10*3/uL (ref 0.0–0.1)
Basophils Relative: 1 %
Eosinophils Absolute: 0.2 10*3/uL (ref 0.0–0.5)
Eosinophils Relative: 3 %
HCT: 38.4 % — ABNORMAL LOW (ref 39.0–52.0)
Hemoglobin: 12.9 g/dL — ABNORMAL LOW (ref 13.0–17.0)
Immature Granulocytes: 2 %
Lymphocytes Relative: 21 %
Lymphs Abs: 1.4 10*3/uL (ref 0.7–4.0)
MCH: 29.3 pg (ref 26.0–34.0)
MCHC: 33.6 g/dL (ref 30.0–36.0)
MCV: 87.1 fL (ref 80.0–100.0)
Monocytes Absolute: 0.9 10*3/uL (ref 0.1–1.0)
Monocytes Relative: 13 %
Neutro Abs: 4 10*3/uL (ref 1.7–7.7)
Neutrophils Relative %: 60 %
Platelets: 305 10*3/uL (ref 150–400)
RBC: 4.41 MIL/uL (ref 4.22–5.81)
RDW: 14.4 % (ref 11.5–15.5)
WBC: 6.5 10*3/uL (ref 4.0–10.5)
nRBC: 0 % (ref 0.0–0.2)

## 2019-05-30 LAB — COMPREHENSIVE METABOLIC PANEL
ALT: 33 U/L (ref 0–44)
AST: 34 U/L (ref 15–41)
Albumin: 3.2 g/dL — ABNORMAL LOW (ref 3.5–5.0)
Alkaline Phosphatase: 87 U/L (ref 38–126)
Anion gap: 10 (ref 5–15)
BUN: 7 mg/dL — ABNORMAL LOW (ref 8–23)
CO2: 27 mmol/L (ref 22–32)
Calcium: 8.9 mg/dL (ref 8.9–10.3)
Chloride: 103 mmol/L (ref 98–111)
Creatinine, Ser: 0.59 mg/dL — ABNORMAL LOW (ref 0.61–1.24)
GFR calc Af Amer: >60 mL/min (ref >60)
GFR calc non Af Amer: >60 mL/min (ref >60)
Glucose, Bld: 111 mg/dL — ABNORMAL HIGH (ref 70–99)
Potassium: 2.9 mmol/L — ABNORMAL LOW (ref 3.5–5.1)
Sodium: 140 mmol/L (ref 135–145)
Total Bilirubin: 0.5 mg/dL (ref 0.3–1.2)
Total Protein: 7.4 g/dL (ref 6.5–8.1)

## 2019-05-30 LAB — TSH: TSH: 1.83 u[IU]/mL (ref 0.350–4.500)

## 2019-05-30 MED ORDER — PALONOSETRON HCL INJECTION 0.25 MG/5ML
0.2500 mg | Freq: Once | INTRAVENOUS | Status: AC
  Administered 2019-05-30: 0.25 mg via INTRAVENOUS
  Filled 2019-05-30: qty 5

## 2019-05-30 MED ORDER — SODIUM CHLORIDE 0.9 % IV SOLN
Freq: Once | INTRAVENOUS | Status: AC
  Filled 2019-05-30: qty 250

## 2019-05-30 MED ORDER — SODIUM CHLORIDE 0.9 % IV SOLN
10.0000 mg | Freq: Once | INTRAVENOUS | Status: AC
  Administered 2019-05-30: 10 mg via INTRAVENOUS
  Filled 2019-05-30: qty 10

## 2019-05-30 MED ORDER — SODIUM CHLORIDE 0.9 % IV SOLN
1200.0000 mg | Freq: Once | INTRAVENOUS | Status: AC
  Administered 2019-05-30: 1200 mg via INTRAVENOUS
  Filled 2019-05-30: qty 20

## 2019-05-30 MED ORDER — HEPARIN SOD (PORK) LOCK FLUSH 100 UNIT/ML IV SOLN
INTRAVENOUS | Status: AC
  Filled 2019-05-30: qty 5

## 2019-05-30 MED ORDER — SODIUM CHLORIDE 0.9 % IV SOLN
150.0000 mg | Freq: Once | INTRAVENOUS | Status: AC
  Administered 2019-05-30: 150 mg via INTRAVENOUS
  Filled 2019-05-30: qty 150

## 2019-05-30 MED ORDER — SODIUM CHLORIDE 0.9 % IV SOLN
15.0000 mg/kg | Freq: Once | INTRAVENOUS | Status: AC
  Administered 2019-05-30: 1000 mg via INTRAVENOUS
  Filled 2019-05-30: qty 32

## 2019-05-30 MED ORDER — SODIUM CHLORIDE 0.9 % IV SOLN
465.5000 mg | Freq: Once | INTRAVENOUS | Status: AC
  Administered 2019-05-30: 470 mg via INTRAVENOUS
  Filled 2019-05-30: qty 47

## 2019-05-30 MED ORDER — POTASSIUM CHLORIDE CRYS ER 20 MEQ PO TBCR: 20.0000 meq | EXTENDED_RELEASE_TABLET | Freq: Every day | ORAL | 0 refills | Status: DC

## 2019-05-30 MED ORDER — HEPARIN SOD (PORK) LOCK FLUSH 100 UNIT/ML IV SOLN
500.0000 [IU] | Freq: Once | INTRAVENOUS | Status: AC
  Administered 2019-05-30: 500 [IU] via INTRAVENOUS
  Filled 2019-05-30: qty 5

## 2019-05-30 MED ORDER — SODIUM CHLORIDE 0.9% FLUSH
10.0000 mL | INTRAVENOUS | Status: DC | PRN
  Administered 2019-05-30: 10 mL via INTRAVENOUS
  Filled 2019-05-30: qty 10

## 2019-05-30 MED ORDER — FAMOTIDINE IN NACL 20-0.9 MG/50ML-% IV SOLN
20.0000 mg | Freq: Once | INTRAVENOUS | Status: AC
  Administered 2019-05-30: 20 mg via INTRAVENOUS
  Filled 2019-05-30: qty 50

## 2019-05-30 MED ORDER — SODIUM CHLORIDE 0.9 % IV SOLN
200.0000 mg/m2 | Freq: Once | INTRAVENOUS | Status: AC
  Administered 2019-05-30: 354 mg via INTRAVENOUS
  Filled 2019-05-30: qty 59

## 2019-05-30 MED ORDER — DIPHENHYDRAMINE HCL 50 MG/ML IJ SOLN
50.0000 mg | Freq: Once | INTRAMUSCULAR | Status: AC
  Administered 2019-05-30: 50 mg via INTRAVENOUS
  Filled 2019-05-30: qty 1

## 2019-05-30 MED ORDER — AMLODIPINE BESYLATE 5 MG PO TABS: 5.0000 mg | ORAL_TABLET | Freq: Every day | ORAL | 0 refills | Status: DC

## 2019-05-30 MED ORDER — POTASSIUM CHLORIDE 20 MEQ/100ML IV SOLN
20.0000 meq | Freq: Once | INTRAVENOUS | Status: AC
  Administered 2019-05-30: 20 meq via INTRAVENOUS

## 2019-05-30 MED ORDER — ZOLEDRONIC ACID 4 MG/100ML IV SOLN
4.0000 mg | Freq: Once | INTRAVENOUS | Status: AC
  Administered 2019-05-30: 4 mg via INTRAVENOUS
  Filled 2019-05-30: qty 100

## 2019-05-30 NOTE — Progress Notes (Signed)
Hematology/Oncology follow up note [facility] [phone] [phone]   Patient Care Team: Jerrol Banana., MD as PCP - General (Family Medicine) Telford Nab, RN as Registered Nurse Clent Jacks, RN as Registered Nurse  REFERRING PROVIDER: Trinna Post, PA-C  CHIEF COMPLAINTS/REASON FOR VISIT:  Discussion of metastatic cancer management.  HISTORY OF PRESENTING ILLNESS:   Samuel Hood. is a  69 y.o.  male with PMH listed below was seen in consultation at the request of  Samuel Hood, Samuel M, PA-C  for evaluation of lung nodule in the liver lesion. Patient has a past medical history of hypertension, alcohol abuse, smoking emphysema.  He has had serial CTs done in the past.  CT images were independently reviewed by me. 07/26/2017 CT chest with contrast showed extensive pleural-parenchymal scarring within the left upper lobe with several areas of soft tissue nodularity measuring up to 1.6 cm.  In this patient who is at increased risk of lung cancer further investigation with PET scan is recommended. Small chronic appearance loculated hydropneumothorax overlies the left apex. Slowly enlarging lesion within the central portion of the right lobe of liver identified. Per note, patient supposed to have additional work-up done in December repeat CT scan which he did not  Patient was recently seen by primary care provider and has subsequent image done for follow-up. CT on 06/30/2018 showed multiple significantly enlarged paraspinal mass are noted concerning.  The largest measures 3.8 x 0.5 cm and there appears to be a lytic destruction of the adjacent T11 vertebral body.  Also noted is new solid density measuring 17 x 12 mm in the pleural parenchymal density in the left upper lobe noted on prior exam.  Concerning for malignancy.  4.4 ascending thoracic aortic aneurysm.  Recommend annual imaging.  Patient had PET scan done on  07/06/2018 Which showed there are 2 FDG avid lesion within the left upper lobe worrisome for primary lung neoplasm.  There is evidence of chest wall involvement.  Metastasis to the posterior mediastinum is identified with extension into T11 vertebra and possible involvement of the left T12 neural foramina.  Patient reports left shoulder pain.  Smoking half a pack a day not motivated with further questioning quitting Denies weight loss, hemoptysis, cough.  Chronic shortness of breath with exertion. He drinks alcohol daily. Lives with his girlfriend.  He has 5 adult kids  #Hepatitis C  # 6/1?2020  CT-guided biopsy of left-sided posterior mediastinal soft tissue adjacent to T10/11. Patient's case was discussed on tumor board. Consensus was reached that Samuel Hood versus primary lung cancer with hepatoid adenocarcinoma features. Consensus was to proceed with liver biopsy first as liver mass was not FDG avid on PET scan.  Patient underwent liver biopsy and the present to discuss pathology reports.  ## 08/08/2018 Liver mass biopsy showed fragments of necrotic and calcified tissue with adjacent fibrous capsule Scant viable nonneoplastic liver tissue with steatosis, inflammation and non specific fibrosis.  # 08/28/2018 CT guided left upper lobe lung mass biopsy showed poorly differentiated adenocarcinoma of lung origin.  6/19-08/16/2018  Finished palliative radiation to paraspinal soft tissue mass. 09/07/2018 patient was started on lenvatinib 12 mg daily. 10/13/2018 SBRT to lung cancer lesion.  #Patient is on Zometa monthly #He was advised to take calcium supplement.  He reports he quit taking calcium as he broke out rash on her scalp, neck and chest after taking calcium. # 03/30/19 liver lesion biopsy showed poorly differentiated adenocarcinoma, compatible with lung primary.  INTERVAL HISTORY Samuel Hood. is a 69 y.o. male who has above history reviewed by me today presents for follow up visit for  assessment of prior to chemotherapy for the treatment of [place] and lung cancer. Pain is controlled.   Is currently using fentanyl patch 25 MCG per hour every 72 hours, oxycodone 5 mg as needed. No constipation, on Senna Fatigue is worse, especially with exertion. No SOB at rest.   Review of Systems  Constitutional: Positive for fatigue. Negative for appetite change, chills, fever and unexpected weight change.  HENT:   Negative for hearing loss and voice change.   Eyes: Negative for eye problems and icterus.  Respiratory: Negative for chest tightness, cough and shortness of breath.   Cardiovascular: Negative for chest pain and leg swelling.  Gastrointestinal: Negative for abdominal distention, abdominal pain and constipation.  Endocrine: Negative for hot flashes.  Genitourinary: Negative for difficulty urinating, dysuria and frequency.   Musculoskeletal: Negative for arthralgias.  Skin: Negative for itching and rash.  Neurological: Negative for light-headedness and numbness.  Hematological: Negative for adenopathy. Does not bruise/bleed easily.  Psychiatric/Behavioral: Negative for confusion.    MEDICAL HISTORY:  Past Medical History:  Diagnosis Date  . Hepatocellular carcinoma metastatic to left lung ([place]) 07/24/2018  . History of angiography    left lower extremity  . Hypertension   . Small bowel obstruction ([place])     SURGICAL HISTORY: Past Surgical History:  Procedure Laterality Date  . COLON SURGERY    . COLONOSCOPY W/ POLYPECTOMY    . COLONOSCOPY WITH PROPOFOL N/A 01/31/2018   Procedure: COLONOSCOPY WITH PROPOFOL;  Surgeon: Toledo, Benay Pike, MD;  Location: [facility] ENDOSCOPY;  Service: Gastroenterology;  Laterality: N/A;  . debridement fasciotomy leg, left Left 03/19/2016  . LAPAROTOMY N/A 09/27/2014   Procedure: EXPLORATORY LAPAROTOMY;  Surgeon: Dia Crawford III, MD;  Location: [facility] ORS;  Service: General;  Laterality: N/A;  . PORTA CATH INSERTION N/A 04/10/2019   Procedure: PORTA  CATH INSERTION;  Surgeon: Katha Cabal, MD;  Location: [facility];  Service: Cardiovascular;  Laterality: N/A;    SOCIAL HISTORY: Social History   Socioeconomic History  . Marital status: Single    Spouse name: Not on file  . Number of children: Not on file  . Years of education: Not on file  . Highest education level: Not on file  Occupational History  . Occupation: retired  Tobacco Use  . Smoking status: Former Smoker    Packs/day: 0.25    Years: 50.00    Pack years: 12.50  . Smokeless tobacco: Never Used  . Tobacco comment: 1-2/week  Substance and Sexual Activity  . Alcohol use: Not Currently    Alcohol/week: 35.0 standard drinks    Types: 30 Cans of beer, 5 Shots of liquor per week  . Drug use: No  . Sexual activity: Not on file  Other Topics Concern  . Not on file  Social History Narrative  . Not on file   Social Determinants of Health   Financial Resource Strain: High Risk  . Difficulty of Paying Living Expenses: Hard  Food Insecurity: No Food Insecurity  . Worried About Charity fundraiser in the Last Year: Never true  . Ran Out of Food in the Last Year: Never true  Transportation Needs: No Transportation Needs  . Lack of Transportation (Medical): No  . Lack of Transportation (Non-Medical): No  Physical Activity: Sufficiently Active  . Days of Exercise per Week: 7 days  .  Minutes of Exercise per Session: 30 min  Stress: No Stress Concern Present  . Feeling of Stress : Not at all  Social Connections: Unknown  . Frequency of Communication with Friends and Family: More than three times a week  . Frequency of Social Gatherings with Friends and Family: More than three times a week  . Attends Religious Services: More than 4 times per year  . Active Member of Clubs or Organizations: Not on file  . Attends Archivist Meetings: Not on file  . Marital Status: Not on file  Intimate Partner Violence: Unknown  . Fear of Current or Ex-Partner:  Patient refused  . Emotionally Abused: No  . Physically Abused: No  . Sexually Abused: No    FAMILY HISTORY: Family History  Problem Relation Age of Onset  . Cancer Mother   . Cancer Father     ALLERGIES:  is allergic to 5-alpha reductase inhibitors.  MEDICATIONS:  Current Outpatient Medications  Medication Sig Dispense Refill  . dexamethasone (DECADRON) 4 MG tablet Take 2 tablets (8 mg total) by mouth daily. Start the day after carboplatin chemotherapy for 3 days. 30 tablet 1  . fentaNYL (DURAGESIC) 25 MCG/HR Place 1 patch onto the skin every 3 (three) days. 10 patch 0  . lidocaine-prilocaine (EMLA) cream Apply to affected area once (Patient taking differently: Apply 1 application topically daily as needed (port acess). ) 30 g 3  . Multiple Vitamin (MULTIVITAMIN) capsule Take 1 capsule by mouth daily.    Marland Kitchen NARCAN 4 MG/0.1ML LIQD nasal spray kit 1 spray once.    Marland Kitchen oxyCODONE (ROXICODONE) 5 MG immediate release tablet Take 1 tablet (5 mg total) by mouth every 8 (eight) hours as needed for severe pain or breakthrough pain. 30 tablet 0  . amLODipine (NORVASC) 5 MG tablet Take 1 tablet (5 mg total) by mouth daily. 30 tablet 0  . loperamide (IMODIUM) 2 MG capsule Take 1 capsule (2 mg total) by mouth See admin instructions. With onset of loose stool, take 1m followed by 2100mevery 2 hours until 12 hours have passed without loose bowel movement. Maximum: 16 mg/day (Patient not taking: Reported on 04/23/2019) 60 capsule 1  . ondansetron (ZOFRAN) 8 MG tablet Take 1 tablet (8 mg total) by mouth 2 (two) times daily as needed for refractory nausea / vomiting. Start on day 3 after carboplatin chemo. (Patient not taking: Reported on 04/13/2019) 30 tablet 1  . polyethylene glycol (MIRALAX / GLYCOLAX) 17 g packet Take 17 g by mouth daily. (Patient not taking: Reported on 05/30/2019) 14 each 0  . potassium chloride SA (KLOR-CON M20) 20 MEQ tablet Take 1 tablet (20 mEq total) by mouth daily. 21 tablet 0  .  prochlorperazine (COMPAZINE) 10 MG tablet Take 1 tablet (10 mg total) by mouth every 6 (six) hours as needed (Nausea or vomiting). (Patient not taking: Reported on 04/23/2019) 30 tablet 1  . senna (SENOKOT) 8.6 MG TABS tablet Take 1 tablet (8.6 mg total) by mouth daily. (Patient not taking: Reported on 05/09/2019) 120 tablet 0   No current facility-administered medications for this visit.   Facility-Administered Medications Ordered in Other Visits  Medication Dose Route Frequency Provider Last Rate Last Admin  . heparin lock flush 100 unit/mL  500 Units Intravenous Once YuEarlie ServerMD      . potassium chloride 20 mEq in 100 mL IVPB  20 mEq Intravenous Once YuEarlie ServerMD      . sodium chloride flush (NS) 0.9 %  injection 10 mL  10 mL Intravenous PRN Earlie Server, MD   10 mL at 05/30/19 0824  . Zoledronic Acid (ZOMETA) IVPB 4 mg  4 mg Intravenous Once Earlie Server, MD         PHYSICAL EXAMINATION: ECOG PERFORMANCE STATUS: 1 - Symptomatic but completely ambulatory Vitals:   05/30/19 0850  BP: 113/86  Pulse: (!) 107  Resp: 16  Temp: (!) 97.1 F (36.2 C)  SpO2: 100%   Filed Weights   05/30/19 0850  Weight: 139 lb (63 kg)    Physical Exam Constitutional:      General: He is not in acute distress. HENT:     Head: Normocephalic and atraumatic.  Eyes:     General: No scleral icterus. Cardiovascular:     Rate and Rhythm: Normal rate and regular rhythm.     Heart sounds: Normal heart sounds.  Pulmonary:     Effort: Pulmonary effort is normal. No respiratory distress.     Breath sounds: No wheezing.  Abdominal:     General: Bowel sounds are normal. There is no distension.     Palpations: Abdomen is soft.  Musculoskeletal:        General: No deformity. Normal range of motion.     Cervical back: Normal range of motion and neck supple.  Skin:    General: Skin is warm and dry.     Findings: No erythema or rash.  Neurological:     Mental Status: He is alert and oriented to person, place, and  time. Mental status is at baseline.     Cranial Nerves: No cranial nerve deficit.     Coordination: Coordination normal.  Psychiatric:        Mood and Affect: Mood normal.     LABORATORY DATA:  I have reviewed the data as listed Lab Results  Component Value Date   WBC 6.5 05/30/2019   HGB 12.9 (L) 05/30/2019   HCT 38.4 (L) 05/30/2019   MCV 87.1 05/30/2019   PLT 305 05/30/2019   Recent Labs    04/30/19 1245 04/30/19 1245 05/09/19 0810 05/14/19 0527 05/30/19 0824  NA 135   < > 137 139 140  K 3.7   < > 3.4* 3.2* 2.9*  CL 107   < > 103 100 103  CO2 23   < > _0 GLUCOSE 107*   < > 104* 96 111*  BUN 8   < > 7* 19 7*  CREATININE 0.72   < > 0.74 0.73 0.59*  CALCIUM 8.7*   < > 8.7* 9.2 8.9  GFRNONAA >60   < > >60 >60 >60  GFRAA >60   < > >60 >60 >60  PROT 7.3  --  7.6  --  7.4  ALBUMIN 3.4*  --  3.4*  --  3.2*  AST 37  --  43*  --  34  ALT 48*  --  40  --  33  ALKPHOS 80  --  86  --  87  BILITOT 0.5  --  0.5  --  0.5   < > = values in this interval not displayed.   Iron/TIBC/Ferritin/ %Sat No results found for: IRON, TIBC, FERRITIN, IRONPCTSAT   RADIOGRAPHIC STUDIES: I have personally reviewed the radiological images as listed and agreed with the findings in the report. CT Chest W Contrast  Result Date: 03/26/2019 CLINICAL DATA:  Restaging left lung cancer. Hepatocellular carcinoma restaging. EXAM: CT CHEST, ABDOMEN, AND PELVIS WITH CONTRAST  TECHNIQUE: Multidetector CT imaging of the chest, abdomen and pelvis was performed following the standard protocol during bolus administration of intravenous contrast. CONTRAST:  140m OMNIPAQUE IOHEXOL 300 MG/ML  SOLN COMPARISON:  03/24/2019 and 11/29/2018 FINDINGS: CT CHEST FINDINGS Cardiovascular: Stable ascending thoracic aortic aneurysm, 4.2 cm in diameter. Coronary, aortic arch, and branch vessel atherosclerotic vascular disease. Prominence of the azygos vein. Mediastinum/Nodes: No pathologic adenopathy identified.  Lungs/Pleura: Prominent emphysema. Bilateral airway thickening. Architectural distortion, scarring, and some chronic nodularity in the left upper lobe, much of which is likely therapy related, and not appreciably changed. Musculoskeletal: Stable mild left paraspinal density at the T11 level with residual erosion along the left anterior T11 vertebral body. The soft tissue density adjacent to T11 is difficult to completely separate from the adjacent azygos vein but measures about 1.6 by 1.5 cm on image 48/2, and minimally are erodes the adjacent left lateral cortex of T11. This is roughly stable from 03/24/2019 exam. CT ABDOMEN PELVIS FINDINGS Hepatobiliary: Sharply defined segment 8 mass with mild internal heterogeneity measures 3.1 by 2.9 cm, previously 3.2 by 2.9 cm. On the prior exam was query whether this lesion occluding part of the hepatic vein, certainly the lesion is closely associated with the right hepatic vein although definite occlusion is not observed. Adjacent rim enhancing lesion in segment 81.5 by 1.5 cm, stable. A segment 6 lesion with central washout and peripheral enhancement measures about 2.2 by 2.4 cm on image 65/2, stable. Ill-defined hypodense lesion posteriorly in the right hepatic lobe approximately 0.7 cm in diameter on image 71/2. Overall the hepatic lesions are unchanged. 1.3 cm gallstone in the gallbladder. The gallbladder is mildly contracted. No biliary dilatation. Pancreas: Unremarkable Spleen: Unremarkable Adrenals/Urinary Tract: Both adrenal glands appear normal. Stable right renal hypodense lesions, likely cysts. Stomach/Bowel: Swirling of the small bowel mesentery in the central pelvis but without dilated bowel to suggest a significant volvulus. Stable postoperative findings along proximal sigmoid colon margin. Vascular/Lymphatic: Aortoiliac atherosclerotic vascular disease. No pathologic adenopathy identified. Reproductive: Speckled calcifications along the tunica albuginea the  penis. Prostate gland unchanged. Other: No supplemental non-categorized findings. Musculoskeletal: Bridging spurring of both sacroiliac joints. Grade 1 anterolisthesis at L5-S1. Lumbar spondylosis and degenerative disc disease causing multilevel impingement most striking at L5-S1. IMPRESSION: 1. Essentially stable appearance of the hepatic metastatic lesions, not unexpected given that prior exam was from 2 days ago. The left paraspinal soft tissue density at T11 is likewise stable. 2. Stable architectural distortion, scarring, and chronic nodularity in the left upper lobe, much of which is likely therapy related. 3. Swirling of the small bowel mesenteric vessels in the pelvis, but no dilated bowel to suggest a significant volvulus. 4. Other imaging findings of potential clinical significance: Airway thickening is present, suggesting bronchitis or reactive airways disease. Stable ascending thoracic aortic aneurysm, 4.2 cm in diameter. Cholelithiasis. Lumbar spondylosis and degenerative disc disease causing multilevel impingement most striking at L5-S1. Bridging spurring of both sacroiliac joints. Aortic Atherosclerosis (ICD10-I70.0) and Emphysema (ICD10-J43.9). Electronically Signed   By: WVan ClinesM.D.   On: 03/26/2019 10:58   CT Angio Chest PE W and/or Wo Contrast  Result Date: 03/24/2019 CLINICAL DATA:  69year old male with chest pain. History of hepatocellular carcinoma/hepatocellular adenocarcinoma with a positive biopsy in the posterior mediastinum. Subsequent biopsy of liver mass was negative. EXAM: CT ANGIOGRAPHY CHEST CT ABDOMEN AND PELVIS WITH CONTRAST TECHNIQUE: Multidetector CT imaging of the chest was performed using the standard protocol during bolus administration of intravenous contrast. Multiplanar CT image reconstructions  and MIPs were obtained to evaluate the vascular anatomy. Multidetector CT imaging of the abdomen and pelvis was performed using the standard protocol during bolus  administration of intravenous contrast. CONTRAST:  12m OMNIPAQUE IOHEXOL 350 MG/ML SOLN COMPARISON:  11/29/2018, CT 06/18/2014, 07/26/2017, 06/30/2018 FINDINGS: CTA CHEST FINDINGS Cardiovascular: Heart: Heart size unchanged. No pericardial fluid/thickening. Calcifications of the left main, left anterior descending, circumflex, right coronary arteries. Aorta: Greatest diameter of the ascending aorta estimated 42 mm which is unchanged from the comparison. Opacification of the aorta it is limited given the timing of the contrast bolus for the pulmonary arteries. Common origin of the innominate artery an the left common carotid artery. Mild atherosclerosis of the aortic arch. Pulmonary arteries: No central, lobar, segmental, or proximal subsegmental filling defects. Pulmonary arteries are attenuated within the left upper lobe secondary to treatment effect. Mediastinum/Nodes: No mediastinal adenopathy. Unremarkable appearance of the thoracic esophagus. Nodularity of the posterior mediastinum is significantly decreased as compared to the pre treatment CT of 06/30/2018. Unremarkable thoracic inlet. Lungs/Pleura: Advanced paraseptal and centrilobular emphysema, including bullous changes at the bilateral lung apices. Redemonstration of treatment changes of the left upper lung, with architectural distortion, scarring, and pleural thickening persisting. Subpleural reticulation at the right lower lobe lung base. No confluent airspace disease. No pleural effusion. Review of the MIP images confirms the above findings. CT ABDOMEN and PELVIS FINDINGS Hepatobiliary: Redemonstration of the heterogeneous mass within the right liver, segment 8, with well-defined border and a diameter of 2.7 cm, unchanged. Compared to prior CT there is a new satellite nodule extending inferior and lateral to this lesion on image 15 of series 4. Second hepatic lesion measuring 2.1 cm within segment 6 on image 22 of series 4, new. Third a [place] lesion  segment 6 at the tip of the liver, image 27 of series 4 Additionally there is a heterogeneously hypoattenuating/enhancing region of the left liver with what appears to be occlusion of the left hepatic vein (image 14 of series 4. Hyper dense material in the dependent aspect of the gallbladder, without inflammatory changes. Pancreas: Unremarkable Spleen: Unremarkable Adrenals/Urinary Tract: - Right adrenal gland:  Unremarkable - Left adrenal gland: Unremarkable. - Right kidney: No hydronephrosis, nephrolithiasis, inflammation, or ureteral dilation. Rounded lesions on the lateral cortex of the right kidney, most likely benign, unchanged - Left Kidney: No hydronephrosis, nephrolithiasis, inflammation, or ureteral dilation. No focal lesion. - Urinary Bladder: Unremarkable. Stomach/Bowel: - Stomach: Hiatal hernia with otherwise unremarkable stomach - Small bowel: Unremarkable - Appendix: Normal - Colon: Mild stool burden.  No focal inflammatory changes. Vascular/Lymphatic: Atherosclerotic calcifications of the abdominal aorta and bilateral iliac arteries. Proximal femoral arteries remain patent. No pelvic or abdominal adenopathy. Reproductive: Transverse diameter of the prostate measures 41 mm Other: Surgical changes along the midline abdomen with laxity in the midline. Small bowel loops are immediately subjacent to the midline laparotomy site Musculoskeletal: Degenerative changes of the thoracolumbar spine. Vacuum disc phenomenon spans all levels of the lumbar spine. Lytic lesion of T11 with sclerotic border. While this is unchanged in size from the most recent comparison CT, it is new from 2019 and in the region of the pathologically proven tumor of the posterior mediastinum. Review of the MIP images confirms the above findings. IMPRESSION: CT negative for pulmonary emboli, with no acute finding to account for chest pain. Progression of hepatic malignancy, with at least 3 new hepatic lesions, separate from the  previously biopsied lesion of the right liver. In addition there is questionable new infiltrative disease  of the left liver which appears to occlude the left hepatic vein. Referral for follow-up with oncology is indicated, as well as consideration of contrast-enhanced liver MRI. Treatment changes of the left upper lobe without evidence of local recurrence/progression. In addition, the T11 malignant lytic lesion and biopsy proven tumor of left posterior mediastinal/paravertebral T11 level are unchanged. Advanced emphysema.  Emphysema (ICD10-J43.9). Aortic atherosclerosis and iliac arterial disease. Aortic Atherosclerosis (ICD10-I70.0). Unchanged diameter of ascending aorta, 4.2 cm. Recommend annual imaging followup by CTA or MRA. This recommendation follows 2010 ACCF/AHA/AATS/ACR/ASA/SCA/SCAI/SIR/STS/SVM Guidelines for the Diagnosis and Management of Patients with Thoracic Aortic Disease. Circulation. 2010; 121: [id number]. Aortic aneurysm NOS (ICD10-I71.9) Electronically Signed   By: Corrie Mckusick D.O.   On: 03/24/2019 08:53   NM Bone Scan Whole Body  Result Date: 04/14/2019 CLINICAL DATA:  Stage IV lung cancer.  No recent falls or trauma. EXAM: NUCLEAR MEDICINE WHOLE BODY BONE SCAN TECHNIQUE: Whole body anterior and posterior images were obtained approximately 3 hours after intravenous injection of radiopharmaceutical. RADIOPHARMACEUTICALS:  22.9 mCi Technetium-58mMDP IV COMPARISON:  03/26/2019 chest abdomen and pelvic CTs. FINDINGS: Degenerative changes of the left knee. No abnormal uptake within the axial or appendicular skeleton otherwise. Physiologic renal and bladder excretion. IMPRESSION: No findings of osseous metastasis. Electronically Signed   By: KAbigail MiyamotoM.D.   On: 04/14/2019 09:54   CT Abdomen Pelvis W Contrast  Result Date: 03/26/2019 CLINICAL DATA:  Restaging left lung cancer. Hepatocellular carcinoma restaging. EXAM: CT CHEST, ABDOMEN, AND PELVIS WITH CONTRAST TECHNIQUE: Multidetector CT  imaging of the chest, abdomen and pelvis was performed following the standard protocol during bolus administration of intravenous contrast. CONTRAST:  1066mOMNIPAQUE IOHEXOL 300 MG/ML  SOLN COMPARISON:  03/24/2019 and 11/29/2018 FINDINGS: CT CHEST FINDINGS Cardiovascular: Stable ascending thoracic aortic aneurysm, 4.2 cm in diameter. Coronary, aortic arch, and branch vessel atherosclerotic vascular disease. Prominence of the azygos vein. Mediastinum/Nodes: No pathologic adenopathy identified. Lungs/Pleura: Prominent emphysema. Bilateral airway thickening. Architectural distortion, scarring, and some chronic nodularity in the left upper lobe, much of which is likely therapy related, and not appreciably changed. Musculoskeletal: Stable mild left paraspinal density at the T11 level with residual erosion along the left anterior T11 vertebral body. The soft tissue density adjacent to T11 is difficult to completely separate from the adjacent azygos vein but measures about 1.6 by 1.5 cm on image 48/2, and minimally are erodes the adjacent left lateral cortex of T11. This is roughly stable from 03/24/2019 exam. CT ABDOMEN PELVIS FINDINGS Hepatobiliary: Sharply defined segment 8 mass with mild internal heterogeneity measures 3.1 by 2.9 cm, previously 3.2 by 2.9 cm. On the prior exam was query whether this lesion occluding part of the hepatic vein, certainly the lesion is closely associated with the right hepatic vein although definite occlusion is not observed. Adjacent rim enhancing lesion in segment 81.5 by 1.5 cm, stable. A segment 6 lesion with central washout and peripheral enhancement measures about 2.2 by 2.4 cm on image 65/2, stable. Ill-defined hypodense lesion posteriorly in the right hepatic lobe approximately 0.7 cm in diameter on image 71/2. Overall the hepatic lesions are unchanged. 1.3 cm gallstone in the gallbladder. The gallbladder is mildly contracted. No biliary dilatation. Pancreas: Unremarkable Spleen:  Unremarkable Adrenals/Urinary Tract: Both adrenal glands appear normal. Stable right renal hypodense lesions, likely cysts. Stomach/Bowel: Swirling of the small bowel mesentery in the central pelvis but without dilated bowel to suggest a significant volvulus. Stable postoperative findings along proximal sigmoid colon margin. Vascular/Lymphatic: Aortoiliac atherosclerotic vascular disease.  No pathologic adenopathy identified. Reproductive: Speckled calcifications along the tunica albuginea the penis. Prostate gland unchanged. Other: No supplemental non-categorized findings. Musculoskeletal: Bridging spurring of both sacroiliac joints. Grade 1 anterolisthesis at L5-S1. Lumbar spondylosis and degenerative disc disease causing multilevel impingement most striking at L5-S1. IMPRESSION: 1. Essentially stable appearance of the hepatic metastatic lesions, not unexpected given that prior exam was from 2 days ago. The left paraspinal soft tissue density at T11 is likewise stable. 2. Stable architectural distortion, scarring, and chronic nodularity in the left upper lobe, much of which is likely therapy related. 3. Swirling of the small bowel mesenteric vessels in the pelvis, but no dilated bowel to suggest a significant volvulus. 4. Other imaging findings of potential clinical significance: Airway thickening is present, suggesting bronchitis or reactive airways disease. Stable ascending thoracic aortic aneurysm, 4.2 cm in diameter. Cholelithiasis. Lumbar spondylosis and degenerative disc disease causing multilevel impingement most striking at L5-S1. Bridging spurring of both sacroiliac joints. Aortic Atherosclerosis (ICD10-I70.0) and Emphysema (ICD10-J43.9). Electronically Signed   By: Van Clines Hood.D.   On: 03/26/2019 10:58   CT ABDOMEN PELVIS W CONTRAST  Result Date: 03/24/2019 CLINICAL DATA:  69 year old male with chest pain. History of hepatocellular carcinoma/hepatocellular adenocarcinoma with a positive biopsy  in the posterior mediastinum. Subsequent biopsy of liver mass was negative. EXAM: CT ANGIOGRAPHY CHEST CT ABDOMEN AND PELVIS WITH CONTRAST TECHNIQUE: Multidetector CT imaging of the chest was performed using the standard protocol during bolus administration of intravenous contrast. Multiplanar CT image reconstructions and MIPs were obtained to evaluate the vascular anatomy. Multidetector CT imaging of the abdomen and pelvis was performed using the standard protocol during bolus administration of intravenous contrast. CONTRAST:  58m OMNIPAQUE IOHEXOL 350 MG/ML SOLN COMPARISON:  11/29/2018, CT 06/18/2014, 07/26/2017, 06/30/2018 FINDINGS: CTA CHEST FINDINGS Cardiovascular: Heart: Heart size unchanged. No pericardial fluid/thickening. Calcifications of the left main, left anterior descending, circumflex, right coronary arteries. Aorta: Greatest diameter of the ascending aorta estimated 42 mm which is unchanged from the comparison. Opacification of the aorta it is limited given the timing of the contrast bolus for the pulmonary arteries. Common origin of the innominate artery an the left common carotid artery. Mild atherosclerosis of the aortic arch. Pulmonary arteries: No central, lobar, segmental, or proximal subsegmental filling defects. Pulmonary arteries are attenuated within the left upper lobe secondary to treatment effect. Mediastinum/Nodes: No mediastinal adenopathy. Unremarkable appearance of the thoracic esophagus. Nodularity of the posterior mediastinum is significantly decreased as compared to the pre treatment CT of 06/30/2018. Unremarkable thoracic inlet. Lungs/Pleura: Advanced paraseptal and centrilobular emphysema, including bullous changes at the bilateral lung apices. Redemonstration of treatment changes of the left upper lung, with architectural distortion, scarring, and pleural thickening persisting. Subpleural reticulation at the right lower lobe lung base. No confluent airspace disease. No pleural  effusion. Review of the MIP images confirms the above findings. CT ABDOMEN and PELVIS FINDINGS Hepatobiliary: Redemonstration of the heterogeneous mass within the right liver, segment 8, with well-defined border and a diameter of 2.7 cm, unchanged. Compared to prior CT there is a new satellite nodule extending inferior and lateral to this lesion on image 15 of series 4. Second hepatic lesion measuring 2.1 cm within segment 6 on image 22 of series 4, new. Third a [place]ock lesion segment 6 at the tip of the liver, image 27 of series 4 Additionally there is a heterogeneously hypoattenuating/enhancing region of the left liver with what appears to be occlusion of the left hepatic vein (image 14 of series 4. Hyper  dense material in the dependent aspect of the gallbladder, without inflammatory changes. Pancreas: Unremarkable Spleen: Unremarkable Adrenals/Urinary Tract: - Right adrenal gland:  Unremarkable - Left adrenal gland: Unremarkable. - Right kidney: No hydronephrosis, nephrolithiasis, inflammation, or ureteral dilation. Rounded lesions on the lateral cortex of the right kidney, most likely benign, unchanged - Left Kidney: No hydronephrosis, nephrolithiasis, inflammation, or ureteral dilation. No focal lesion. - Urinary Bladder: Unremarkable. Stomach/Bowel: - Stomach: Hiatal hernia with otherwise unremarkable stomach - Small bowel: Unremarkable - Appendix: Normal - Colon: Mild stool burden.  No focal inflammatory changes. Vascular/Lymphatic: Atherosclerotic calcifications of the abdominal aorta and bilateral iliac arteries. Proximal femoral arteries remain patent. No pelvic or abdominal adenopathy. Reproductive: Transverse diameter of the prostate measures 41 mm Other: Surgical changes along the midline abdomen with laxity in the midline. Small bowel loops are immediately subjacent to the midline laparotomy site Musculoskeletal: Degenerative changes of the thoracolumbar spine. Vacuum disc phenomenon spans all levels  of the lumbar spine. Lytic lesion of T11 with sclerotic border. While this is unchanged in size from the most recent comparison CT, it is new from 2019 and in the region of the pathologically proven tumor of the posterior mediastinum. Review of the MIP images confirms the above findings. IMPRESSION: CT negative for pulmonary emboli, with no acute finding to account for chest pain. Progression of hepatic malignancy, with at least 3 new hepatic lesions, separate from the previously biopsied lesion of the right liver. In addition there is questionable new infiltrative disease of the left liver which appears to occlude the left hepatic vein. Referral for follow-up with oncology is indicated, as well as consideration of contrast-enhanced liver MRI. Treatment changes of the left upper lobe without evidence of local recurrence/progression. In addition, the T11 malignant lytic lesion and biopsy proven tumor of left posterior mediastinal/paravertebral T11 level are unchanged. Advanced emphysema.  Emphysema (ICD10-J43.9). Aortic atherosclerosis and iliac arterial disease. Aortic Atherosclerosis (ICD10-I70.0). Unchanged diameter of ascending aorta, 4.2 cm. Recommend annual imaging followup by CTA or MRA. This recommendation follows 2010 ACCF/AHA/AATS/ACR/ASA/SCA/SCAI/SIR/STS/SVM Guidelines for the Diagnosis and Management of Patients with Thoracic Aortic Disease. Circulation. 2010; 121: I016-P537. Aortic aneurysm NOS (ICD10-I71.9) Electronically Signed   By: Corrie Mckusick D.O.   On: 03/24/2019 08:53   PERIPHERAL VASCULAR CATHETERIZATION  Result Date: 04/10/2019 See Op Note  US BIOPSY (LIVER)  Result Date: 03/30/2019 INDICATION: History of lung cancer and hepatocellular carcinoma. New right inferior hepatic lesion EXAM: ULTRASOUND GUIDED CORE BIOPSY OF NEW RIGHT INFERIOR HEPATIC LESION MEDICATIONS: 1% LIDOCAINE LOCAL ANESTHESIA/SEDATION: Fentanyl 100 mcg IV; Versed 2.0 mg IV Moderate Sedation Time:  13 MINUTES The  patient was continuously monitored during the procedure by the interventional radiology nurse under my direct supervision. PROCEDURE: The procedure, risks, benefits, and alternatives were explained to the patient. Questions regarding the procedure were encouraged and answered. The patient understands and consents to the procedure. The right anterior subcostal area was prepped with ChloraPrep in a sterile fashion, and a sterile drape was applied covering the operative field. A sterile gown and sterile gloves were used for the procedure. Local anesthesia was provided with 1% Lidocaine. previous imaging reviewed. Preliminary ultrasound performed of the right hepatic lobe from a right upper quadrant subcostal approach. The right inferior hepatic lesion was localized and marked. Under sterile conditions and local anesthesia, a 17 gauge coaxial guide was advanced to the lesion. Images obtained for documentation. 2 18 gauge core biopsies obtained. Samples were intact and non fragmented. These were placed in formalin. Needle removed. Needle  tract occluded with Gel-Foam. Postprocedure imaging demonstrates no hemorrhage or hematoma. Patient tolerated biopsy well. COMPLICATIONS: None immediate. FINDINGS: Imaging confirms needle placement in the right inferior hepatic lesion for core biopsy IMPRESSION: Successful ultrasound right inferior hepatic lesion 18 gauge core biopsies Electronically Signed   By: Jerilynn Mages.  Shick Hood.D.   On: 03/30/2019 10:34   DG Chest Port 1 View  Result Date: 03/24/2019 CLINICAL DATA:  Acute chest pain. Patient with LEFT lung cancer. EXAM: PORTABLE CHEST 1 VIEW COMPARISON:  08/01/2017 FINDINGS: Cardiomediastinal silhouette is unchanged. Scarring within the UPPER LEFT lung again noted. Emphysema again identified. There is no evidence of focal airspace disease, pulmonary edema, suspicious pulmonary nodule/mass, pleural effusion, or pneumothorax. No acute bony abnormalities are identified. IMPRESSION: No  evidence of acute cardiopulmonary disease. Electronically Signed   By: Margarette Canada Hood.D.   On: 03/24/2019 07:39   DG Abdomen Acute W/Chest  Result Date: 05/14/2019 CLINICAL DATA:  Abdominal distension and constipation EXAM: DG ABDOMEN ACUTE W/ 1V CHEST COMPARISON:  03/26/2019 CT FINDINGS: Normal heart size and stable mediastinal contours. Hood treatment scarring in the left upper lobe. There is no edema, consolidation, effusion, or pneumothorax. Porta catheter with tip at the upper SVC. Moderate stool volume seen along the flanks. No worrisome bowel dilatation. No concerning mass effect or gas collection. Lumbar spine degeneration IMPRESSION: Nonobstructive bowel gas pattern with moderate stool volume. Electronically Signed   By: Monte Fantasia Hood.D.   On: 05/14/2019 07:16      ASSESSMENT & PLAN:  1. Cancer, metastatic to bone ([place])   2. Encounter for antineoplastic chemotherapy   3. Stage IV adenocarcinoma of lung, left ([place])   4. Hepatocellular carcinoma metastatic to left lung ([place])   5. Neoplasm related pain   6. Bone lesion   7. Encounter for antineoplastic immunotherapy   8. Hypokalemia    #Stage IV Lung adenocarcinoma, Stage IV HCC-07/17/2018 paraspinal soft tissue biopsy Status Hood cycle 1 carbo/Taxol/Avastin/Tecentriq. Patient is clinically doing well. Labs reviewed and discussed with patient. Proceed with cycle 3 carboplatin/Taxol/Avastin/Tecentriq.  #Neoplasm related pain, continue fentanyl patch 25 MCG/h every 72 hours. Patient has oxycodone 5 mg he uses every 8 hours as needed for breakthrough pain. #Bone lesion. We will proceed with Zometa today. He declines taking calcium supplementation. #Patient has been referred to establish care with Dr. Rosanna Randy. Patient has not been able to establish care. I will refill his amlodipine 5 mg. Continue follow-up with primary care provider for further management of his blood pressure. BP was 113/86, heart rate 107. Repeat vitals at infusion  center was 129/90, heart rate 101. #Hypokalemia, potassium is 2.9. Patient will be given IV potassium chloride 20 mEq x 1 today.  I prescribed potassium chloride 20 mEq daily prescription, sent to his pharmacy. Advised patient to start taking and also increase consumption of potassium enriched food. #Continue follow-up with palliative care service. We spent sufficient time to discuss many aspect of care, questions were answered to patient's satisfaction. All questions were answered. The patient knows to call the clinic with any problems questions or concerns.   Earlie Server, MD, PhD 05/30/2019

## 2019-05-30 NOTE — Progress Notes (Signed)
Patient took last Amlodipine today.  He has tried multiple times calling Dr. Alben Spittle office for new patient appointment (assigned by insurance) but has not been able to talk with anyone on the phone.  Feels that he has an increase of dyspnea on exertion.

## 2019-05-30 NOTE — Progress Notes (Signed)
HR on recheck =101.  MD ok to proceed with treatment today

## 2019-05-31 ENCOUNTER — Telehealth: Payer: Self-pay

## 2019-05-31 LAB — AFP TUMOR MARKER: AFP, Serum, Tumor Marker: 11.2 ng/mL — ABNORMAL HIGH (ref 0.0–8.3)

## 2019-05-31 LAB — T4: T4, Total: 12.4 ug/dL — ABNORMAL HIGH (ref 4.5–12.0)

## 2019-05-31 NOTE — Telephone Encounter (Signed)
Patient has an appointment at [facility] with Laverna Peace, NP on 07/12/2019 @ 1:20 to establish care.    Attempted call to inform patient but no answer and not an option to leave a message.

## 2019-05-31 NOTE — Telephone Encounter (Signed)
Patient aware.

## 2019-06-04 ENCOUNTER — Inpatient Hospital Stay (HOSPITAL_BASED_OUTPATIENT_CLINIC_OR_DEPARTMENT_OTHER): Payer: Medicare Other | Admitting: Hospice and Palliative Medicine

## 2019-06-04 DIAGNOSIS — Z515 Encounter for palliative care: Secondary | ICD-10-CM

## 2019-06-04 DIAGNOSIS — C7951 Secondary malignant neoplasm of bone: Secondary | ICD-10-CM

## 2019-06-04 NOTE — Progress Notes (Signed)
Virtual Visit via Telephone Note  I connected with Samuel Hood on 06/04/19 at  1:00 PM EDT by telephone and verified that I am speaking with the correct person using two identifiers.   I discussed the limitations, risks, security and privacy concerns of performing an evaluation and management service by telephone and the availability of in person appointments. I also discussed with the patient that there may be a patient responsible charge related to this service. The patient expressed understanding and agreed to proceed.   History of Present Illness: Samuel Hood. is a 69 y.o. male with multiple medical problems including IV adenocarcinoma of the lung (diagnosed 03/30/2019) metastatic to liver and bone and chest wall, also with stage IV HCC (diagnosed 07/17/2018). PMH also notable for HCV, history of alcohol use/tobacco abuse. Palliative care was consulted to help address goals and manage ongoing symptoms.   Observations/Objective: I called and spoke with patient by phone.  He reports doing well.  He denies any significant changes or concerns.  No symptomatic complaints today.  He had been told that he needed to transition to a new PCP by his insurance and he has an clinic visit scheduled next month with Samuel Hood.  Patient denies any issues with his medications.  He did not need any refills today.  Assessment and Plan: Stage IV HCC-on treatment with carbo/Taxol/Avastin/Tecentriq and followed by Samuel Hood.  Patient seems to be tolerating treatments.  Neoplasm related pain -continue fentanyl patch 25 mcg every 72 hours with use of oxycodone 5 mg every 8 hours as needed for breakthrough pain   Follow Up Instructions: Follow-up virtual visit about a month   I discussed the assessment and treatment plan with the patient. The patient was provided an opportunity to ask questions and all were answered. The patient agreed with the plan and demonstrated an understanding of the instructions.    The patient was advised to call back or seek an in-person evaluation if the symptoms worsen or if the condition fails to improve as anticipated.  I provided 5 minutes of non-face-to-face time during this encounter.   Irean Hong, NP

## 2019-06-06 ENCOUNTER — Other Ambulatory Visit: Payer: Self-pay

## 2019-06-06 ENCOUNTER — Inpatient Hospital Stay: Payer: Medicare Other

## 2019-06-06 DIAGNOSIS — Z5112 Encounter for antineoplastic immunotherapy: Secondary | ICD-10-CM | POA: Diagnosis not present

## 2019-06-06 DIAGNOSIS — C7951 Secondary malignant neoplasm of bone: Secondary | ICD-10-CM

## 2019-06-06 LAB — BASIC METABOLIC PANEL
Anion gap: 10 (ref 5–15)
BUN: 13 mg/dL (ref 8–23)
CO2: 26 mmol/L (ref 22–32)
Calcium: 9 mg/dL (ref 8.9–10.3)
Chloride: 101 mmol/L (ref 98–111)
Creatinine, Ser: 0.61 mg/dL (ref 0.61–1.24)
GFR calc Af Amer: >60 mL/min (ref >60)
GFR calc non Af Amer: >60 mL/min (ref >60)
Glucose, Bld: 100 mg/dL — ABNORMAL HIGH (ref 70–99)
Potassium: 3.5 mmol/L (ref 3.5–5.1)
Sodium: 137 mmol/L (ref 135–145)

## 2019-06-13 NOTE — Progress Notes (Signed)
Pharmacist Chemotherapy Monitoring - Follow Up Assessment    I verify that I have reviewed each item in the below checklist:  . Regimen for the patient is scheduled for the appropriate day and plan matches scheduled date. Marland Kitchen Appropriate non-routine labs are ordered dependent on drug ordered. . If applicable, additional medications reviewed and ordered per protocol based on lifetime cumulative doses and/or treatment regimen.   Plan for follow-up and/or issues identified: No . I-vent associated with next due treatment: No . MD and/or nursing notified: No  Keyante Durio K 06/13/2019 8:43 AM

## 2019-06-20 ENCOUNTER — Inpatient Hospital Stay (HOSPITAL_BASED_OUTPATIENT_CLINIC_OR_DEPARTMENT_OTHER): Payer: Medicare Other | Admitting: Oncology

## 2019-06-20 ENCOUNTER — Other Ambulatory Visit: Payer: Self-pay

## 2019-06-20 ENCOUNTER — Encounter: Payer: Self-pay | Admitting: Oncology

## 2019-06-20 ENCOUNTER — Inpatient Hospital Stay: Payer: Medicare Other

## 2019-06-20 ENCOUNTER — Inpatient Hospital Stay: Payer: Medicare Other | Attending: Oncology

## 2019-06-20 VITALS — BP 125/86 | HR 73 | Resp 18

## 2019-06-20 VITALS — BP 128/89 | HR 94 | Temp 96.1°F | Resp 16 | Wt 138.8 lb

## 2019-06-20 DIAGNOSIS — J439 Emphysema, unspecified: Secondary | ICD-10-CM | POA: Diagnosis not present

## 2019-06-20 DIAGNOSIS — C787 Secondary malignant neoplasm of liver and intrahepatic bile duct: Secondary | ICD-10-CM | POA: Insufficient documentation

## 2019-06-20 DIAGNOSIS — C3492 Malignant neoplasm of unspecified part of left bronchus or lung: Secondary | ICD-10-CM

## 2019-06-20 DIAGNOSIS — Z5112 Encounter for antineoplastic immunotherapy: Secondary | ICD-10-CM | POA: Diagnosis not present

## 2019-06-20 DIAGNOSIS — Z79899 Other long term (current) drug therapy: Secondary | ICD-10-CM | POA: Insufficient documentation

## 2019-06-20 DIAGNOSIS — Z87891 Personal history of nicotine dependence: Secondary | ICD-10-CM | POA: Diagnosis not present

## 2019-06-20 DIAGNOSIS — C220 Liver cell carcinoma: Secondary | ICD-10-CM

## 2019-06-20 DIAGNOSIS — C7951 Secondary malignant neoplasm of bone: Secondary | ICD-10-CM

## 2019-06-20 DIAGNOSIS — Z95828 Presence of other vascular implants and grafts: Secondary | ICD-10-CM

## 2019-06-20 DIAGNOSIS — K449 Diaphragmatic hernia without obstruction or gangrene: Secondary | ICD-10-CM | POA: Diagnosis not present

## 2019-06-20 DIAGNOSIS — R100 Acute abdomen: Secondary | ICD-10-CM | POA: Diagnosis not present

## 2019-06-20 DIAGNOSIS — Z5111 Encounter for antineoplastic chemotherapy: Secondary | ICD-10-CM | POA: Diagnosis not present

## 2019-06-20 DIAGNOSIS — R634 Abnormal weight loss: Secondary | ICD-10-CM | POA: Insufficient documentation

## 2019-06-20 DIAGNOSIS — G893 Neoplasm related pain (acute) (chronic): Secondary | ICD-10-CM

## 2019-06-20 DIAGNOSIS — E876 Hypokalemia: Secondary | ICD-10-CM | POA: Diagnosis not present

## 2019-06-20 DIAGNOSIS — I10 Essential (primary) hypertension: Secondary | ICD-10-CM | POA: Insufficient documentation

## 2019-06-20 DIAGNOSIS — N281 Cyst of kidney, acquired: Secondary | ICD-10-CM | POA: Diagnosis not present

## 2019-06-20 DIAGNOSIS — C7802 Secondary malignant neoplasm of left lung: Secondary | ICD-10-CM

## 2019-06-20 DIAGNOSIS — C3412 Malignant neoplasm of upper lobe, left bronchus or lung: Secondary | ICD-10-CM | POA: Insufficient documentation

## 2019-06-20 DIAGNOSIS — B1920 Unspecified viral hepatitis C without hepatic coma: Secondary | ICD-10-CM | POA: Diagnosis not present

## 2019-06-20 DIAGNOSIS — I700 Atherosclerosis of aorta: Secondary | ICD-10-CM | POA: Diagnosis not present

## 2019-06-20 DIAGNOSIS — R531 Weakness: Secondary | ICD-10-CM | POA: Insufficient documentation

## 2019-06-20 DIAGNOSIS — I712 Thoracic aortic aneurysm, without rupture: Secondary | ICD-10-CM | POA: Insufficient documentation

## 2019-06-20 DIAGNOSIS — R140 Abdominal distension (gaseous): Secondary | ICD-10-CM | POA: Diagnosis not present

## 2019-06-20 DIAGNOSIS — M47816 Spondylosis without myelopathy or radiculopathy, lumbar region: Secondary | ICD-10-CM | POA: Diagnosis not present

## 2019-06-20 DIAGNOSIS — Z809 Family history of malignant neoplasm, unspecified: Secondary | ICD-10-CM | POA: Insufficient documentation

## 2019-06-20 DIAGNOSIS — M25512 Pain in left shoulder: Secondary | ICD-10-CM | POA: Diagnosis not present

## 2019-06-20 DIAGNOSIS — K5900 Constipation, unspecified: Secondary | ICD-10-CM | POA: Diagnosis not present

## 2019-06-20 DIAGNOSIS — M5137 Other intervertebral disc degeneration, lumbosacral region: Secondary | ICD-10-CM | POA: Insufficient documentation

## 2019-06-20 LAB — CBC WITH DIFFERENTIAL/PLATELET
Abs Immature Granulocytes: 0.07 10*3/uL (ref 0.00–0.07)
Basophils Absolute: 0 10*3/uL (ref 0.0–0.1)
Basophils Relative: 1 %
Eosinophils Absolute: 0.1 10*3/uL (ref 0.0–0.5)
Eosinophils Relative: 2 %
HCT: 36.4 % — ABNORMAL LOW (ref 39.0–52.0)
Hemoglobin: 12.3 g/dL — ABNORMAL LOW (ref 13.0–17.0)
Immature Granulocytes: 1 %
Lymphocytes Relative: 25 %
Lymphs Abs: 1.4 10*3/uL (ref 0.7–4.0)
MCH: 29.9 pg (ref 26.0–34.0)
MCHC: 33.8 g/dL (ref 30.0–36.0)
MCV: 88.6 fL (ref 80.0–100.0)
Monocytes Absolute: 0.9 10*3/uL (ref 0.1–1.0)
Monocytes Relative: 15 %
Neutro Abs: 3.1 10*3/uL (ref 1.7–7.7)
Neutrophils Relative %: 56 %
Platelets: 232 10*3/uL (ref 150–400)
RBC: 4.11 MIL/uL — ABNORMAL LOW (ref 4.22–5.81)
RDW: 16.3 % — ABNORMAL HIGH (ref 11.5–15.5)
WBC: 5.6 10*3/uL (ref 4.0–10.5)
nRBC: 0 % (ref 0.0–0.2)

## 2019-06-20 LAB — COMPREHENSIVE METABOLIC PANEL
ALT: 42 U/L (ref 0–44)
AST: 42 U/L — ABNORMAL HIGH (ref 15–41)
Albumin: 3.3 g/dL — ABNORMAL LOW (ref 3.5–5.0)
Alkaline Phosphatase: 82 U/L (ref 38–126)
Anion gap: 9 (ref 5–15)
BUN: 7 mg/dL — ABNORMAL LOW (ref 8–23)
CO2: 25 mmol/L (ref 22–32)
Calcium: 8.8 mg/dL — ABNORMAL LOW (ref 8.9–10.3)
Chloride: 105 mmol/L (ref 98–111)
Creatinine, Ser: 0.67 mg/dL (ref 0.61–1.24)
GFR calc Af Amer: >60 mL/min (ref >60)
GFR calc non Af Amer: >60 mL/min (ref >60)
Glucose, Bld: 98 mg/dL (ref 70–99)
Potassium: 3.9 mmol/L (ref 3.5–5.1)
Sodium: 139 mmol/L (ref 135–145)
Total Bilirubin: 0.5 mg/dL (ref 0.3–1.2)
Total Protein: 6.7 g/dL (ref 6.5–8.1)

## 2019-06-20 LAB — URINALYSIS, DIPSTICK ONLY
Bilirubin Urine: NEGATIVE
Glucose, UA: NEGATIVE mg/dL
Hgb urine dipstick: NEGATIVE
Ketones, ur: NEGATIVE mg/dL
Leukocytes,Ua: NEGATIVE
Nitrite: NEGATIVE
Protein, ur: NEGATIVE mg/dL
Specific Gravity, Urine: 1.003 — ABNORMAL LOW (ref 1.005–1.030)
pH: 6 (ref 5.0–8.0)

## 2019-06-20 LAB — TSH: TSH: 2.995 u[IU]/mL (ref 0.350–4.500)

## 2019-06-20 MED ORDER — SODIUM CHLORIDE 0.9 % IV SOLN
10.0000 mg | Freq: Once | INTRAVENOUS | Status: AC
  Administered 2019-06-20: 10:00:00 10 mg via INTRAVENOUS
  Filled 2019-06-20: qty 10

## 2019-06-20 MED ORDER — SODIUM CHLORIDE 0.9 % IV SOLN
1200.0000 mg | Freq: Once | INTRAVENOUS | Status: AC
  Administered 2019-06-20: 11:00:00 1200 mg via INTRAVENOUS
  Filled 2019-06-20: qty 20

## 2019-06-20 MED ORDER — HEPARIN SOD (PORK) LOCK FLUSH 100 UNIT/ML IV SOLN
INTRAVENOUS | Status: AC
  Filled 2019-06-20: qty 5

## 2019-06-20 MED ORDER — SODIUM CHLORIDE 0.9% FLUSH
10.0000 mL | INTRAVENOUS | Status: DC | PRN
  Administered 2019-06-20: 10 mL via INTRAVENOUS
  Filled 2019-06-20: qty 10

## 2019-06-20 MED ORDER — SODIUM CHLORIDE 0.9 % IV SOLN
Freq: Once | INTRAVENOUS | Status: AC
  Filled 2019-06-20: qty 250

## 2019-06-20 MED ORDER — FAMOTIDINE IN NACL 20-0.9 MG/50ML-% IV SOLN
20.0000 mg | Freq: Once | INTRAVENOUS | Status: AC
  Administered 2019-06-20: 10:00:00 20 mg via INTRAVENOUS
  Filled 2019-06-20: qty 50

## 2019-06-20 MED ORDER — DIPHENHYDRAMINE HCL 50 MG/ML IJ SOLN
50.0000 mg | Freq: Once | INTRAMUSCULAR | Status: AC
  Administered 2019-06-20: 10:00:00 50 mg via INTRAVENOUS
  Filled 2019-06-20: qty 1

## 2019-06-20 MED ORDER — SODIUM CHLORIDE 0.9 % IV SOLN
150.0000 mg | Freq: Once | INTRAVENOUS | Status: AC
  Administered 2019-06-20: 150 mg via INTRAVENOUS
  Filled 2019-06-20: qty 150

## 2019-06-20 MED ORDER — PALONOSETRON HCL INJECTION 0.25 MG/5ML
0.2500 mg | Freq: Once | INTRAVENOUS | Status: AC
  Administered 2019-06-20: 10:00:00 0.25 mg via INTRAVENOUS
  Filled 2019-06-20: qty 5

## 2019-06-20 MED ORDER — SODIUM CHLORIDE 0.9 % IV SOLN
15.0000 mg/kg | Freq: Once | INTRAVENOUS | Status: AC
  Administered 2019-06-20: 1000 mg via INTRAVENOUS
  Filled 2019-06-20: qty 32

## 2019-06-20 MED ORDER — FENTANYL 25 MCG/HR TD PT72: 1.0000 | MEDICATED_PATCH | TRANSDERMAL | 0 refills | Status: DC

## 2019-06-20 MED ORDER — OXYCODONE HCL 5 MG PO TABS: 5.0000 mg | ORAL_TABLET | Freq: Four times a day (QID) | ORAL | 0 refills | Status: DC | PRN

## 2019-06-20 MED ORDER — SODIUM CHLORIDE 0.9 % IV SOLN
465.5000 mg | Freq: Once | INTRAVENOUS | Status: AC
  Administered 2019-06-20: 16:00:00 470 mg via INTRAVENOUS
  Filled 2019-06-20: qty 47

## 2019-06-20 MED ORDER — SODIUM CHLORIDE 0.9 % IV SOLN
200.0000 mg/m2 | Freq: Once | INTRAVENOUS | Status: AC
  Administered 2019-06-20: 12:00:00 354 mg via INTRAVENOUS
  Filled 2019-06-20: qty 59

## 2019-06-20 MED ORDER — HEPARIN SOD (PORK) LOCK FLUSH 100 UNIT/ML IV SOLN
500.0000 [IU] | Freq: Once | INTRAVENOUS | Status: AC | PRN
  Administered 2019-06-20: 500 [IU]
  Filled 2019-06-20: qty 5

## 2019-06-20 NOTE — Progress Notes (Signed)
Hematology/Oncology follow up note [facility] [phone] [phone]   Patient Care Team: Samuel Banana., MD as PCP - General (Family Medicine) Samuel Nab, RN as Registered Nurse Samuel Jacks, RN as Registered Nurse  REFERRING PROVIDER: Jerrol Banana.,*  CHIEF COMPLAINTS/REASON FOR VISIT:  Discussion of metastatic cancer management.  HISTORY OF PRESENTING ILLNESS:   Samuel Cranshaw. is a  69 y.o.  male with PMH listed below was seen in consultation at the request of  Samuel Banana.,*  for evaluation of lung nodule in the liver lesion. Patient has a past medical history of hypertension, alcohol abuse, smoking emphysema.  He has had serial CTs done in the past.  CT images were independently reviewed by me. 07/26/2017 CT chest with contrast showed extensive pleural-parenchymal scarring within the left upper lobe with several areas of soft tissue nodularity measuring up to 1.6 cm.  In this patient who is at increased risk of lung cancer further investigation with PET scan is recommended. Small chronic appearance loculated hydropneumothorax overlies the left apex. Slowly enlarging lesion within the central portion of the right lobe of liver identified. Per note, patient supposed to have additional work-up done in December repeat CT scan which he did not  Patient was recently seen by primary care provider and has subsequent image done for follow-up. CT on 06/30/2018 showed multiple significantly enlarged paraspinal mass are noted concerning.  The largest measures 3.8 x 0.5 cm and there appears to be a lytic destruction of the adjacent T11 vertebral body.  Also noted is new solid density measuring 17 x 12 mm in the pleural parenchymal density in the left upper lobe noted on prior exam.  Concerning for malignancy.  4.4 ascending thoracic aortic aneurysm.  Recommend annual imaging.  Patient had PET scan done on  07/06/2018 Which showed there are 2 FDG avid lesion within the left upper lobe worrisome for primary lung neoplasm.  There is evidence of chest wall involvement.  Metastasis to the posterior mediastinum is identified with extension into T11 vertebra and possible involvement of the left T12 neural foramina.  Patient reports left shoulder pain.  Smoking half a pack a day not motivated with further questioning quitting Denies weight loss, hemoptysis, cough.  Chronic shortness of breath with exertion. He drinks alcohol daily. Lives with his girlfriend.  He has 5 adult kids  #Hepatitis C  # 6/1?2020  CT-guided biopsy of left-sided posterior mediastinal soft tissue adjacent to T10/11. Patient's case was discussed on tumor board. Consensus was reached that [facility] versus primary lung cancer with hepatoid adenocarcinoma features. Consensus was to proceed with liver biopsy first as liver mass was not FDG avid on PET scan.  Patient underwent liver biopsy and the present to discuss pathology reports.  ## 08/08/2018 Liver mass biopsy showed fragments of necrotic and calcified tissue with adjacent fibrous capsule Scant viable nonneoplastic liver tissue with steatosis, inflammation and non specific fibrosis.  # 08/28/2018 CT guided left upper lobe lung mass biopsy showed poorly differentiated adenocarcinoma of lung origin.  6/19-08/16/2018  Finished palliative radiation to paraspinal soft tissue mass. 09/07/2018 patient was started on lenvatinib 12 mg daily. 10/13/2018 SBRT to lung cancer lesion.  #Patient is on Zometa monthly #He was advised to take calcium supplement.  He reports he quit taking calcium as he broke out rash on her scalp, neck and chest after taking calcium. # 03/30/19 liver lesion biopsy showed poorly differentiated adenocarcinoma, compatible with lung primary.  INTERVAL HISTORY Samuel Alphin. is a 69 y.o. male who has above history reviewed by me today presents for follow up visit for  assessment of prior to chemotherapy for the treatment of [name] and lung cancer. Patient reports pain is about 5 out of 10, left anterior chest wall and right shoulder.  He uses fentanyl patch and oxycodone 5 mg as needed.  He reports taking 4-5 oxycodones per day. Otherwise he feels well.  More fatigued. Appetite is fair.  Weight is stable.  Review of Systems  Constitutional: Positive for fatigue. Negative for appetite change, chills, fever and unexpected weight change.  HENT:   Negative for hearing loss and voice change.   Eyes: Negative for eye problems and icterus.  Respiratory: Negative for chest tightness, cough and shortness of breath.   Cardiovascular: Negative for chest pain and leg swelling.  Gastrointestinal: Negative for abdominal distention, abdominal pain and constipation.  Endocrine: Negative for hot flashes.  Genitourinary: Negative for difficulty urinating, dysuria and frequency.   Musculoskeletal: Negative for arthralgias.  Skin: Negative for itching and rash.  Neurological: Negative for light-headedness and numbness.  Hematological: Negative for adenopathy. Does not bruise/bleed easily.  Psychiatric/Behavioral: Negative for confusion.    MEDICAL HISTORY:  Past Medical History:  Diagnosis Date  . Hepatocellular carcinoma metastatic to left lung ([place]) 07/24/2018  . History of angiography    left lower extremity  . Hypertension   . Small bowel obstruction ([place])     SURGICAL HISTORY: Past Surgical History:  Procedure Laterality Date  . COLON SURGERY    . COLONOSCOPY W/ POLYPECTOMY    . COLONOSCOPY WITH PROPOFOL N/A 01/31/2018   Procedure: COLONOSCOPY WITH PROPOFOL;  Surgeon: Toledo, Benay Pike, MD;  Location: [facility] ENDOSCOPY;  Service: Gastroenterology;  Laterality: N/A;  . debridement fasciotomy leg, left Left 03/19/2016  . LAPAROTOMY N/A 09/27/2014   Procedure: EXPLORATORY LAPAROTOMY;  Surgeon: Dia Crawford III, MD;  Location: [facility] ORS;  Service: General;  Laterality:  N/A;  . PORTA CATH INSERTION N/A 04/10/2019   Procedure: PORTA CATH INSERTION;  Surgeon: Katha Cabal, MD;  Location: [place] [facility];  Service: Cardiovascular;  Laterality: N/A;    SOCIAL HISTORY: Social History   Socioeconomic History  . Marital status: Single    Spouse name: Not on file  . Number of children: Not on file  . Years of education: Not on file  . Highest education level: Not on file  Occupational History  . Occupation: retired  Tobacco Use  . Smoking status: Former Smoker    Packs/day: 0.25    Years: 50.00    Pack years: 12.50  . Smokeless tobacco: Never Used  . Tobacco comment: 1-2/week  Substance and Sexual Activity  . Alcohol use: Not Currently    Alcohol/week: 35.0 standard drinks    Types: 30 Cans of beer, 5 Shots of liquor per week  . Drug use: No  . Sexual activity: Not on file  Other Topics Concern  . Not on file  Social History Narrative  . Not on file   Social Determinants of Health   Financial Resource Strain: High Risk  . Difficulty of Paying Living Expenses: Hard  Food Insecurity: No Food Insecurity  . Worried About Charity fundraiser in the Last Year: Never true  . Ran Out of Food in the Last Year: Never true  Transportation Needs: No Transportation Needs  . Lack of Transportation (Medical): No  . Lack of Transportation (Non-Medical): No  Physical Activity:  Sufficiently Active  . Days of Exercise per Week: 7 days  . Minutes of Exercise per Session: 30 min  Stress: No Stress Concern Present  . Feeling of Stress : Not at all  Social Connections: Unknown  . Frequency of Communication with Friends and Family: More than three times a week  . Frequency of Social Gatherings with Friends and Family: More than three times a week  . Attends Religious Services: More than 4 times per year  . Active Member of Clubs or Organizations: Not on file  . Attends Archivist Meetings: Not on file  . Marital Status: Not on file   Intimate Partner Violence: Unknown  . Fear of Current or Ex-Partner: Patient refused  . Emotionally Abused: No  . Physically Abused: No  . Sexually Abused: No    FAMILY HISTORY: Family History  Problem Relation Age of Onset  . Cancer Mother   . Cancer Father     ALLERGIES:  is allergic to 5-alpha reductase inhibitors.  MEDICATIONS:  Current Outpatient Medications  Medication Sig Dispense Refill  . amLODipine (NORVASC) 5 MG tablet Take 1 tablet (5 mg total) by mouth daily. 30 tablet 0  . dexamethasone (DECADRON) 4 MG tablet Take 2 tablets (8 mg total) by mouth daily. Start the day after carboplatin chemotherapy for 3 days. 30 tablet 1  . fentaNYL (DURAGESIC) 25 MCG/HR Place 1 patch onto the skin every 3 (three) days. 10 patch 0  . lidocaine-prilocaine (EMLA) cream Apply to affected area once (Patient taking differently: Apply 1 application topically daily as needed (port acess). ) 30 g 3  . loperamide (IMODIUM) 2 MG capsule Take 1 capsule (2 mg total) by mouth See admin instructions. With onset of loose stool, take 39m followed by 281mevery 2 hours until 12 hours have passed without loose bowel movement. Maximum: 16 mg/day (Patient not taking: Reported on 04/23/2019) 60 capsule 1  . Multiple Vitamin (MULTIVITAMIN) capsule Take 1 capsule by mouth daily.    . Marland KitchenARCAN 4 MG/0.1ML LIQD nasal spray kit 1 spray once.    . ondansetron (ZOFRAN) 8 MG tablet Take 1 tablet (8 mg total) by mouth 2 (two) times daily as needed for refractory nausea / vomiting. Start on day 3 after carboplatin chemo. (Patient not taking: Reported on 04/13/2019) 30 tablet 1  . oxyCODONE (ROXICODONE) 5 MG immediate release tablet Take 1 tablet (5 mg total) by mouth every 8 (eight) hours as needed for severe pain or breakthrough pain. 30 tablet 0  . polyethylene glycol (MIRALAX / GLYCOLAX) 17 g packet Take 17 g by mouth daily. (Patient not taking: Reported on 05/30/2019) 14 each 0  . potassium chloride SA (KLOR-CON M20) 20 MEQ  tablet Take 1 tablet (20 mEq total) by mouth daily. 21 tablet 0  . prochlorperazine (COMPAZINE) 10 MG tablet Take 1 tablet (10 mg total) by mouth every 6 (six) hours as needed (Nausea or vomiting). (Patient not taking: Reported on 04/23/2019) 30 tablet 1  . senna (SENOKOT) 8.6 MG TABS tablet Take 1 tablet (8.6 mg total) by mouth daily. (Patient not taking: Reported on 05/09/2019) 120 tablet 0   No current facility-administered medications for this visit.     PHYSICAL EXAMINATION: ECOG PERFORMANCE STATUS: 1 - Symptomatic but completely ambulatory Vitals:   06/20/19 0832  BP: 128/89  Pulse: 94  Resp: 16  Temp: (!) 96.1 F (35.6 C)   Filed Weights   06/20/19 0832  Weight: 138 lb 12.8 oz (63 kg)  Physical Exam Constitutional:      General: He is not in acute distress. HENT:     Head: Normocephalic and atraumatic.  Eyes:     General: No scleral icterus. Cardiovascular:     Rate and Rhythm: Normal rate and regular rhythm.     Heart sounds: Normal heart sounds.  Pulmonary:     Effort: Pulmonary effort is normal. No respiratory distress.     Breath sounds: No wheezing.  Abdominal:     General: Bowel sounds are normal. There is no distension.     Palpations: Abdomen is soft.  Musculoskeletal:        General: No deformity. Normal range of motion.     Cervical back: Normal range of motion and neck supple.  Skin:    General: Skin is warm and dry.     Findings: No erythema or rash.  Neurological:     Mental Status: He is alert and oriented to person, place, and time. Mental status is at baseline.     Cranial Nerves: No cranial nerve deficit.     Coordination: Coordination normal.  Psychiatric:        Mood and Affect: Mood normal.     LABORATORY DATA:  I have reviewed the data as listed Lab Results  Component Value Date   WBC 6.5 05/30/2019   HGB 12.9 (L) 05/30/2019   HCT 38.4 (L) 05/30/2019   MCV 87.1 05/30/2019   PLT 305 05/30/2019   Recent Labs    04/30/19 1245  04/30/19 1245 05/09/19 0810 05/09/19 0810 05/14/19 0527 05/30/19 0824 06/06/19 1051  NA 135   < > 137   < > 139 140 137  K 3.7   < > 3.4*   < > 3.2* 2.9* 3.5  CL 107   < > 103   < > 100 103 101  CO2 23   < > 24   < > _0 GLUCOSE 107*   < > 104*   < > 96 111* 100*  BUN 8   < > 7*   < > 19 7* 13  CREATININE 0.72   < > 0.74   < > 0.73 0.59* 0.61  CALCIUM 8.7*   < > 8.7*   < > 9.2 8.9 9.0  GFRNONAA >60   < > >60   < > >60 >60 >60  GFRAA >60   < > >60   < > >60 >60 >60  PROT 7.3  --  7.6  --   --  7.4  --   ALBUMIN 3.4*  --  3.4*  --   --  3.2*  --   AST 37  --  43*  --   --  34  --   ALT 48*  --  40  --   --  33  --   ALKPHOS 80  --  86  --   --  87  --   BILITOT 0.5  --  0.5  --   --  0.5  --    < > = values in this interval not displayed.   Iron/TIBC/Ferritin/ %Sat No results found for: IRON, TIBC, FERRITIN, IRONPCTSAT   RADIOGRAPHIC STUDIES: I have personally reviewed the radiological images as listed and agreed with the findings in the report. CT Chest W Contrast  Result Date: 03/26/2019 CLINICAL DATA:  Restaging left lung cancer. Hepatocellular carcinoma restaging. EXAM: CT CHEST, ABDOMEN, AND PELVIS WITH CONTRAST TECHNIQUE: Multidetector CT imaging of  the chest, abdomen and pelvis was performed following the standard protocol during bolus administration of intravenous contrast. CONTRAST:  143m OMNIPAQUE IOHEXOL 300 MG/ML  SOLN COMPARISON:  03/24/2019 and 11/29/2018 FINDINGS: CT CHEST FINDINGS Cardiovascular: Stable ascending thoracic aortic aneurysm, 4.2 cm in diameter. Coronary, aortic arch, and branch vessel atherosclerotic vascular disease. Prominence of the azygos vein. Mediastinum/Nodes: No pathologic adenopathy identified. Lungs/Pleura: Prominent emphysema. Bilateral airway thickening. Architectural distortion, scarring, and some chronic nodularity in the left upper lobe, much of which is likely therapy related, and not appreciably changed. Musculoskeletal: Stable mild  left paraspinal density at the T11 level with residual erosion along the left anterior T11 vertebral body. The soft tissue density adjacent to T11 is difficult to completely separate from the adjacent azygos vein but measures about 1.6 by 1.5 cm on image 48/2, and minimally are erodes the adjacent left lateral cortex of T11. This is roughly stable from 03/24/2019 exam. CT ABDOMEN PELVIS FINDINGS Hepatobiliary: Sharply defined segment 8 mass with mild internal heterogeneity measures 3.1 by 2.9 cm, previously 3.2 by 2.9 cm. On the prior exam was query whether this lesion occluding part of the hepatic vein, certainly the lesion is closely associated with the right hepatic vein although definite occlusion is not observed. Adjacent rim enhancing lesion in segment 81.5 by 1.5 cm, stable. A segment 6 lesion with central washout and peripheral enhancement measures about 2.2 by 2.4 cm on image 65/2, stable. Ill-defined hypodense lesion posteriorly in the right hepatic lobe approximately 0.7 cm in diameter on image 71/2. Overall the hepatic lesions are unchanged. 1.3 cm gallstone in the gallbladder. The gallbladder is mildly contracted. No biliary dilatation. Pancreas: Unremarkable Spleen: Unremarkable Adrenals/Urinary Tract: Both adrenal glands appear normal. Stable right renal hypodense lesions, likely cysts. Stomach/Bowel: Swirling of the small bowel mesentery in the central pelvis but without dilated bowel to suggest a significant volvulus. Stable postoperative findings along proximal sigmoid colon margin. Vascular/Lymphatic: Aortoiliac atherosclerotic vascular disease. No pathologic adenopathy identified. Reproductive: Speckled calcifications along the tunica albuginea the penis. Prostate gland unchanged. Other: No supplemental non-categorized findings. Musculoskeletal: Bridging spurring of both sacroiliac joints. Grade 1 anterolisthesis at L5-S1. Lumbar spondylosis and degenerative disc disease causing multilevel  impingement most striking at L5-S1. IMPRESSION: 1. Essentially stable appearance of the hepatic metastatic lesions, not unexpected given that prior exam was from 2 days ago. The left paraspinal soft tissue density at T11 is likewise stable. 2. Stable architectural distortion, scarring, and chronic nodularity in the left upper lobe, much of which is likely therapy related. 3. Swirling of the small bowel mesenteric vessels in the pelvis, but no dilated bowel to suggest a significant volvulus. 4. Other imaging findings of potential clinical significance: Airway thickening is present, suggesting bronchitis or reactive airways disease. Stable ascending thoracic aortic aneurysm, 4.2 cm in diameter. Cholelithiasis. Lumbar spondylosis and degenerative disc disease causing multilevel impingement most striking at L5-S1. Bridging spurring of both sacroiliac joints. Aortic Atherosclerosis (ICD10-I70.0) and Emphysema (ICD10-J43.9). Electronically Signed   By: WVan ClinesM.D.   On: 03/26/2019 10:58   CT Angio Chest PE W and/or Wo Contrast  Result Date: 03/24/2019 CLINICAL DATA:  69year old male with chest pain. History of hepatocellular carcinoma/hepatocellular adenocarcinoma with a positive biopsy in the posterior mediastinum. Subsequent biopsy of liver mass was negative. EXAM: CT ANGIOGRAPHY CHEST CT ABDOMEN AND PELVIS WITH CONTRAST TECHNIQUE: Multidetector CT imaging of the chest was performed using the standard protocol during bolus administration of intravenous contrast. Multiplanar CT image reconstructions and MIPs were obtained to  evaluate the vascular anatomy. Multidetector CT imaging of the abdomen and pelvis was performed using the standard protocol during bolus administration of intravenous contrast. CONTRAST:  20m OMNIPAQUE IOHEXOL 350 MG/ML SOLN COMPARISON:  11/29/2018, CT 06/18/2014, 07/26/2017, 06/30/2018 FINDINGS: CTA CHEST FINDINGS Cardiovascular: Heart: Heart size unchanged. No pericardial  fluid/thickening. Calcifications of the left main, left anterior descending, circumflex, right coronary arteries. Aorta: Greatest diameter of the ascending aorta estimated 42 mm which is unchanged from the comparison. Opacification of the aorta it is limited given the timing of the contrast bolus for the pulmonary arteries. Common origin of the innominate artery an the left common carotid artery. Mild atherosclerosis of the aortic arch. Pulmonary arteries: No central, lobar, segmental, or proximal subsegmental filling defects. Pulmonary arteries are attenuated within the left upper lobe secondary to treatment effect. Mediastinum/Nodes: No mediastinal adenopathy. Unremarkable appearance of the thoracic esophagus. Nodularity of the posterior mediastinum is significantly decreased as compared to the pre treatment CT of 06/30/2018. Unremarkable thoracic inlet. Lungs/Pleura: Advanced paraseptal and centrilobular emphysema, including bullous changes at the bilateral lung apices. Redemonstration of treatment changes of the left upper lung, with architectural distortion, scarring, and pleural thickening persisting. Subpleural reticulation at the right lower lobe lung base. No confluent airspace disease. No pleural effusion. Review of the MIP images confirms the above findings. CT ABDOMEN and PELVIS FINDINGS Hepatobiliary: Redemonstration of the heterogeneous mass within the right liver, segment 8, with well-defined border and a diameter of 2.7 cm, unchanged. Compared to prior CT there is a new satellite nodule extending inferior and lateral to this lesion on image 15 of series 4. Second hepatic lesion measuring 2.1 cm within segment 6 on image 22 of series 4, new. Third a [place] lesion segment 6 at the tip of the liver, image 27 of series 4 Additionally there is a heterogeneously hypoattenuating/enhancing region of the left liver with what appears to be occlusion of the left hepatic vein (image 14 of series 4. Hyper dense  material in the dependent aspect of the gallbladder, without inflammatory changes. Pancreas: Unremarkable Spleen: Unremarkable Adrenals/Urinary Tract: - Right adrenal gland:  Unremarkable - Left adrenal gland: Unremarkable. - Right kidney: No hydronephrosis, nephrolithiasis, inflammation, or ureteral dilation. Rounded lesions on the lateral cortex of the right kidney, most likely benign, unchanged - Left Kidney: No hydronephrosis, nephrolithiasis, inflammation, or ureteral dilation. No focal lesion. - Urinary Bladder: Unremarkable. Stomach/Bowel: - Stomach: Hiatal hernia with otherwise unremarkable stomach - Small bowel: Unremarkable - Appendix: Normal - Colon: Mild stool burden.  No focal inflammatory changes. Vascular/Lymphatic: Atherosclerotic calcifications of the abdominal aorta and bilateral iliac arteries. Proximal femoral arteries remain patent. No pelvic or abdominal adenopathy. Reproductive: Transverse diameter of the prostate measures 41 mm Other: Surgical changes along the midline abdomen with laxity in the midline. Small bowel loops are immediately subjacent to the midline laparotomy site Musculoskeletal: Degenerative changes of the thoracolumbar spine. Vacuum disc phenomenon spans all levels of the lumbar spine. Lytic lesion of T11 with sclerotic border. While this is unchanged in size from the most recent comparison CT, it is new from 2019 and in the region of the pathologically proven tumor of the posterior mediastinum. Review of the MIP images confirms the above findings. IMPRESSION: CT negative for pulmonary emboli, with no acute finding to account for chest pain. Progression of hepatic malignancy, with at least 3 new hepatic lesions, separate from the previously biopsied lesion of the right liver. In addition there is questionable new infiltrative disease of the left liver which appears  to occlude the left hepatic vein. Referral for follow-up with oncology is indicated, as well as consideration of  contrast-enhanced liver MRI. Treatment changes of the left upper lobe without evidence of local recurrence/progression. In addition, the T11 malignant lytic lesion and biopsy proven tumor of left posterior mediastinal/paravertebral T11 level are unchanged. Advanced emphysema.  Emphysema (ICD10-J43.9). Aortic atherosclerosis and iliac arterial disease. Aortic Atherosclerosis (ICD10-I70.0). Unchanged diameter of ascending aorta, 4.2 cm. Recommend annual imaging followup by CTA or MRA. This recommendation follows 2010 ACCF/AHA/AATS/ACR/ASA/SCA/SCAI/SIR/STS/SVM Guidelines for the Diagnosis and Management of Patients with Thoracic Aortic Disease. Circulation. 2010; 121: D322-G254. Aortic aneurysm NOS (ICD10-I71.9) Electronically Signed   By: Corrie Mckusick D.O.   On: 03/24/2019 08:53   NM Bone Scan Whole Body  Result Date: 04/14/2019 CLINICAL DATA:  Stage IV lung cancer.  No recent falls or trauma. EXAM: NUCLEAR MEDICINE WHOLE BODY BONE SCAN TECHNIQUE: Whole body anterior and posterior images were obtained approximately 3 hours after intravenous injection of radiopharmaceutical. RADIOPHARMACEUTICALS:  22.9 mCi Technetium-67mMDP IV COMPARISON:  03/26/2019 chest abdomen and pelvic CTs. FINDINGS: Degenerative changes of the left knee. No abnormal uptake within the axial or appendicular skeleton otherwise. Physiologic renal and bladder excretion. IMPRESSION: No findings of osseous metastasis. Electronically Signed   By: KAbigail MiyamotoM.D.   On: 04/14/2019 09:54   CT Abdomen Pelvis W Contrast  Result Date: 03/26/2019 CLINICAL DATA:  Restaging left lung cancer. Hepatocellular carcinoma restaging. EXAM: CT CHEST, ABDOMEN, AND PELVIS WITH CONTRAST TECHNIQUE: Multidetector CT imaging of the chest, abdomen and pelvis was performed following the standard protocol during bolus administration of intravenous contrast. CONTRAST:  1066mOMNIPAQUE IOHEXOL 300 MG/ML  SOLN COMPARISON:  03/24/2019 and 11/29/2018 FINDINGS: CT CHEST  FINDINGS Cardiovascular: Stable ascending thoracic aortic aneurysm, 4.2 cm in diameter. Coronary, aortic arch, and branch vessel atherosclerotic vascular disease. Prominence of the azygos vein. Mediastinum/Nodes: No pathologic adenopathy identified. Lungs/Pleura: Prominent emphysema. Bilateral airway thickening. Architectural distortion, scarring, and some chronic nodularity in the left upper lobe, much of which is likely therapy related, and not appreciably changed. Musculoskeletal: Stable mild left paraspinal density at the T11 level with residual erosion along the left anterior T11 vertebral body. The soft tissue density adjacent to T11 is difficult to completely separate from the adjacent azygos vein but measures about 1.6 by 1.5 cm on image 48/2, and minimally are erodes the adjacent left lateral cortex of T11. This is roughly stable from 03/24/2019 exam. CT ABDOMEN PELVIS FINDINGS Hepatobiliary: Sharply defined segment 8 mass with mild internal heterogeneity measures 3.1 by 2.9 cm, previously 3.2 by 2.9 cm. On the prior exam was query whether this lesion occluding part of the hepatic vein, certainly the lesion is closely associated with the right hepatic vein although definite occlusion is not observed. Adjacent rim enhancing lesion in segment 81.5 by 1.5 cm, stable. A segment 6 lesion with central washout and peripheral enhancement measures about 2.2 by 2.4 cm on image 65/2, stable. Ill-defined hypodense lesion posteriorly in the right hepatic lobe approximately 0.7 cm in diameter on image 71/2. Overall the hepatic lesions are unchanged. 1.3 cm gallstone in the gallbladder. The gallbladder is mildly contracted. No biliary dilatation. Pancreas: Unremarkable Spleen: Unremarkable Adrenals/Urinary Tract: Both adrenal glands appear normal. Stable right renal hypodense lesions, likely cysts. Stomach/Bowel: Swirling of the small bowel mesentery in the central pelvis but without dilated bowel to suggest a significant  volvulus. Stable postoperative findings along proximal sigmoid colon margin. Vascular/Lymphatic: Aortoiliac atherosclerotic vascular disease. No pathologic adenopathy identified. Reproductive:  Speckled calcifications along the tunica albuginea the penis. Prostate gland unchanged. Other: No supplemental non-categorized findings. Musculoskeletal: Bridging spurring of both sacroiliac joints. Grade 1 anterolisthesis at L5-S1. Lumbar spondylosis and degenerative disc disease causing multilevel impingement most striking at L5-S1. IMPRESSION: 1. Essentially stable appearance of the hepatic metastatic lesions, not unexpected given that prior exam was from 2 days ago. The left paraspinal soft tissue density at T11 is likewise stable. 2. Stable architectural distortion, scarring, and chronic nodularity in the left upper lobe, much of which is likely therapy related. 3. Swirling of the small bowel mesenteric vessels in the pelvis, but no dilated bowel to suggest a significant volvulus. 4. Other imaging findings of potential clinical significance: Airway thickening is present, suggesting bronchitis or reactive airways disease. Stable ascending thoracic aortic aneurysm, 4.2 cm in diameter. Cholelithiasis. Lumbar spondylosis and degenerative disc disease causing multilevel impingement most striking at L5-S1. Bridging spurring of both sacroiliac joints. Aortic Atherosclerosis (ICD10-I70.0) and Emphysema (ICD10-J43.9). Electronically Signed   By: Van Clines M.D.   On: 03/26/2019 10:58   CT ABDOMEN PELVIS W CONTRAST  Result Date: 03/24/2019 CLINICAL DATA:  69 year old male with chest pain. History of hepatocellular carcinoma/hepatocellular adenocarcinoma with a positive biopsy in the posterior mediastinum. Subsequent biopsy of liver mass was negative. EXAM: CT ANGIOGRAPHY CHEST CT ABDOMEN AND PELVIS WITH CONTRAST TECHNIQUE: Multidetector CT imaging of the chest was performed using the standard protocol during bolus  administration of intravenous contrast. Multiplanar CT image reconstructions and MIPs were obtained to evaluate the vascular anatomy. Multidetector CT imaging of the abdomen and pelvis was performed using the standard protocol during bolus administration of intravenous contrast. CONTRAST:  84m OMNIPAQUE IOHEXOL 350 MG/ML SOLN COMPARISON:  11/29/2018, CT 06/18/2014, 07/26/2017, 06/30/2018 FINDINGS: CTA CHEST FINDINGS Cardiovascular: Heart: Heart size unchanged. No pericardial fluid/thickening. Calcifications of the left main, left anterior descending, circumflex, right coronary arteries. Aorta: Greatest diameter of the ascending aorta estimated 42 mm which is unchanged from the comparison. Opacification of the aorta it is limited given the timing of the contrast bolus for the pulmonary arteries. Common origin of the innominate artery an the left common carotid artery. Mild atherosclerosis of the aortic arch. Pulmonary arteries: No central, lobar, segmental, or proximal subsegmental filling defects. Pulmonary arteries are attenuated within the left upper lobe secondary to treatment effect. Mediastinum/Nodes: No mediastinal adenopathy. Unremarkable appearance of the thoracic esophagus. Nodularity of the posterior mediastinum is significantly decreased as compared to the pre treatment CT of 06/30/2018. Unremarkable thoracic inlet. Lungs/Pleura: Advanced paraseptal and centrilobular emphysema, including bullous changes at the bilateral lung apices. Redemonstration of treatment changes of the left upper lung, with architectural distortion, scarring, and pleural thickening persisting. Subpleural reticulation at the right lower lobe lung base. No confluent airspace disease. No pleural effusion. Review of the MIP images confirms the above findings. CT ABDOMEN and PELVIS FINDINGS Hepatobiliary: Redemonstration of the heterogeneous mass within the right liver, segment 8, with well-defined border and a diameter of 2.7 cm,  unchanged. Compared to prior CT there is a new satellite nodule extending inferior and lateral to this lesion on image 15 of series 4. Second hepatic lesion measuring 2.1 cm within segment 6 on image 22 of series 4, new. Third a [place] lesion segment 6 at the tip of the liver, image 27 of series 4 Additionally there is a heterogeneously hypoattenuating/enhancing region of the left liver with what appears to be occlusion of the left hepatic vein (image 14 of series 4. Hyper dense material in the dependent  aspect of the gallbladder, without inflammatory changes. Pancreas: Unremarkable Spleen: Unremarkable Adrenals/Urinary Tract: - Right adrenal gland:  Unremarkable - Left adrenal gland: Unremarkable. - Right kidney: No hydronephrosis, nephrolithiasis, inflammation, or ureteral dilation. Rounded lesions on the lateral cortex of the right kidney, most likely benign, unchanged - Left Kidney: No hydronephrosis, nephrolithiasis, inflammation, or ureteral dilation. No focal lesion. - Urinary Bladder: Unremarkable. Stomach/Bowel: - Stomach: Hiatal hernia with otherwise unremarkable stomach - Small bowel: Unremarkable - Appendix: Normal - Colon: Mild stool burden.  No focal inflammatory changes. Vascular/Lymphatic: Atherosclerotic calcifications of the abdominal aorta and bilateral iliac arteries. Proximal femoral arteries remain patent. No pelvic or abdominal adenopathy. Reproductive: Transverse diameter of the prostate measures 41 mm Other: Surgical changes along the midline abdomen with laxity in the midline. Small bowel loops are immediately subjacent to the midline laparotomy site Musculoskeletal: Degenerative changes of the thoracolumbar spine. Vacuum disc phenomenon spans all levels of the lumbar spine. Lytic lesion of T11 with sclerotic border. While this is unchanged in size from the most recent comparison CT, it is new from 2019 and in the region of the pathologically proven tumor of the posterior mediastinum.  Review of the MIP images confirms the above findings. IMPRESSION: CT negative for pulmonary emboli, with no acute finding to account for chest pain. Progression of hepatic malignancy, with at least 3 new hepatic lesions, separate from the previously biopsied lesion of the right liver. In addition there is questionable new infiltrative disease of the left liver which appears to occlude the left hepatic vein. Referral for follow-up with oncology is indicated, as well as consideration of contrast-enhanced liver MRI. Treatment changes of the left upper lobe without evidence of local recurrence/progression. In addition, the T11 malignant lytic lesion and biopsy proven tumor of left posterior mediastinal/paravertebral T11 level are unchanged. Advanced emphysema.  Emphysema (ICD10-J43.9). Aortic atherosclerosis and iliac arterial disease. Aortic Atherosclerosis (ICD10-I70.0). Unchanged diameter of ascending aorta, 4.2 cm. Recommend annual imaging followup by CTA or MRA. This recommendation follows 2010 ACCF/AHA/AATS/ACR/ASA/SCA/SCAI/SIR/STS/SVM Guidelines for the Diagnosis and Management of Patients with Thoracic Aortic Disease. Circulation. 2010; 121: N829-F621. Aortic aneurysm NOS (ICD10-I71.9) Electronically Signed   By: Corrie Mckusick D.O.   On: 03/24/2019 08:53   PERIPHERAL VASCULAR CATHETERIZATION  Result Date: 04/10/2019 See Op Note  US BIOPSY (LIVER)  Result Date: 03/30/2019 INDICATION: History of lung cancer and hepatocellular carcinoma. New right inferior hepatic lesion EXAM: ULTRASOUND GUIDED CORE BIOPSY OF NEW RIGHT INFERIOR HEPATIC LESION MEDICATIONS: 1% LIDOCAINE LOCAL ANESTHESIA/SEDATION: Fentanyl 100 mcg IV; Versed 2.0 mg IV Moderate Sedation Time:  13 MINUTES The patient was continuously monitored during the procedure by the interventional radiology nurse under my direct supervision. PROCEDURE: The procedure, risks, benefits, and alternatives were explained to the patient. Questions regarding the  procedure were encouraged and answered. The patient understands and consents to the procedure. The right anterior subcostal area was prepped with ChloraPrep in a sterile fashion, and a sterile drape was applied covering the operative field. A sterile gown and sterile gloves were used for the procedure. Local anesthesia was provided with 1% Lidocaine. previous imaging reviewed. Preliminary ultrasound performed of the right hepatic lobe from a right upper quadrant subcostal approach. The right inferior hepatic lesion was localized and marked. Under sterile conditions and local anesthesia, a 17 gauge coaxial guide was advanced to the lesion. Images obtained for documentation. 2 18 gauge core biopsies obtained. Samples were intact and non fragmented. These were placed in formalin. Needle removed. Needle tract occluded with Gel-Foam. Postprocedure  imaging demonstrates no hemorrhage or hematoma. Patient tolerated biopsy well. COMPLICATIONS: None immediate. FINDINGS: Imaging confirms needle placement in the right inferior hepatic lesion for core biopsy IMPRESSION: Successful ultrasound right inferior hepatic lesion 18 gauge core biopsies Electronically Signed   By: Jerilynn Mages.  Shick M.D.   On: 03/30/2019 10:34   DG Chest Port 1 View  Result Date: 03/24/2019 CLINICAL DATA:  Acute chest pain. Patient with LEFT lung cancer. EXAM: PORTABLE CHEST 1 VIEW COMPARISON:  08/01/2017 FINDINGS: Cardiomediastinal silhouette is unchanged. Scarring within the UPPER LEFT lung again noted. Emphysema again identified. There is no evidence of focal airspace disease, pulmonary edema, suspicious pulmonary nodule/mass, pleural effusion, or pneumothorax. No acute bony abnormalities are identified. IMPRESSION: No evidence of acute cardiopulmonary disease. Electronically Signed   By: Margarette Canada M.D.   On: 03/24/2019 07:39   DG Abdomen Acute W/Chest  Result Date: 05/14/2019 CLINICAL DATA:  Abdominal distension and constipation EXAM: DG ABDOMEN ACUTE  W/ 1V CHEST COMPARISON:  03/26/2019 CT FINDINGS: Normal heart size and stable mediastinal contours. Post treatment scarring in the left upper lobe. There is no edema, consolidation, effusion, or pneumothorax. Porta catheter with tip at the upper SVC. Moderate stool volume seen along the flanks. No worrisome bowel dilatation. No concerning mass effect or gas collection. Lumbar spine degeneration IMPRESSION: Nonobstructive bowel gas pattern with moderate stool volume. Electronically Signed   By: Monte Fantasia M.D.   On: 05/14/2019 07:16      ASSESSMENT & PLAN:  1. Primary lung adenocarcinoma, left ([place])   2. Hepatocellular carcinoma metastatic to left lung ([place])   3. Encounter for antineoplastic chemotherapy   4. Neoplasm related pain   5. Encounter for antineoplastic immunotherapy    #Stage IV Lung adenocarcinoma, Stage IV HCC-07/17/2018 paraspinal soft tissue biopsy Patient is on palliative chemo Emina carbo/Taxol/Avastin/Tecentriq. Overall he tolerates well. Labs reviewed and discussed with patient.    proceed with cycle 4  carboplatin/Taxol/Avastin/Tecentriq. Obtain CT chest abdomen pelvis with contrast for evaluation of treatment response. #Neoplasm related pain, continue fentanyl patch 25 MCG/h every 72 hours. Continue oxycodone 5 mg, increase the interval to every 6 hours as needed for pain. Follow-up with palliative care #Bone lesion.  Patient will get Zometa at the next visit  #Hypokalemia, potassium normalized.  Discontinue potassium supplementation.  Advised patient to eat potassium rich food.   #Continue follow-up with palliative care service. We spent sufficient time to discuss many aspect of care, questions were answered to patient's satisfaction. All questions were answered. The patient knows to call the clinic with any problems questions or concerns.   Earlie Server, MD, PhD 06/20/2019

## 2019-06-20 NOTE — Progress Notes (Signed)
Patient is feeling more fatigued than his normal today.

## 2019-06-21 LAB — AFP TUMOR MARKER: AFP, Serum, Tumor Marker: 10.3 ng/mL — ABNORMAL HIGH (ref 0.0–8.3)

## 2019-06-21 LAB — T4: T4, Total: 12.5 ug/dL — ABNORMAL HIGH (ref 4.5–12.0)

## 2019-06-29 ENCOUNTER — Other Ambulatory Visit: Payer: Self-pay

## 2019-06-29 ENCOUNTER — Encounter: Payer: Self-pay | Admitting: Oncology

## 2019-06-29 MED ORDER — AMLODIPINE BESYLATE 5 MG PO TABS: 5.0000 mg | ORAL_TABLET | Freq: Every day | ORAL | 0 refills | Status: DC

## 2019-07-04 ENCOUNTER — Ambulatory Visit
Admission: RE | Admit: 2019-07-04 | Discharge: 2019-07-04 | Disposition: A | Discharge status: Home / Self Care | Payer: Medicare Other | Source: Ambulatory Visit | Attending: Oncology | Admitting: Oncology

## 2019-07-04 ENCOUNTER — Other Ambulatory Visit: Payer: Self-pay

## 2019-07-04 ENCOUNTER — Inpatient Hospital Stay (HOSPITAL_BASED_OUTPATIENT_CLINIC_OR_DEPARTMENT_OTHER): Payer: Medicare Other | Admitting: Hospice and Palliative Medicine

## 2019-07-04 DIAGNOSIS — C7802 Secondary malignant neoplasm of left lung: Secondary | ICD-10-CM | POA: Insufficient documentation

## 2019-07-04 DIAGNOSIS — C7951 Secondary malignant neoplasm of bone: Secondary | ICD-10-CM

## 2019-07-04 DIAGNOSIS — C787 Secondary malignant neoplasm of liver and intrahepatic bile duct: Secondary | ICD-10-CM | POA: Diagnosis not present

## 2019-07-04 DIAGNOSIS — C3490 Malignant neoplasm of unspecified part of unspecified bronchus or lung: Secondary | ICD-10-CM | POA: Diagnosis not present

## 2019-07-04 DIAGNOSIS — C3492 Malignant neoplasm of unspecified part of left bronchus or lung: Secondary | ICD-10-CM | POA: Insufficient documentation

## 2019-07-04 DIAGNOSIS — C220 Liver cell carcinoma: Secondary | ICD-10-CM | POA: Diagnosis not present

## 2019-07-04 DIAGNOSIS — Z515 Encounter for palliative care: Secondary | ICD-10-CM | POA: Diagnosis not present

## 2019-07-04 MED ORDER — IOHEXOL 300 MG/ML  SOLN
75.0000 mL | Freq: Once | INTRAMUSCULAR | Status: AC | PRN
  Administered 2019-07-04: 75 mL via INTRAVENOUS

## 2019-07-04 NOTE — Progress Notes (Signed)
Virtual Visit via Telephone Note  I connected with Samuel Hood. on 07/04/19 at  1:00 PM EDT by telephone and verified that I am speaking with the correct person using two identifiers.   I discussed the limitations, risks, security and privacy concerns of performing an evaluation and management service by telephone and the availability of in person appointments. I also discussed with the patient that there may be a patient responsible charge related to this service. The patient expressed understanding and agreed to proceed.   History of Present Illness: Samuel Hood. is a 69 y.o. male with multiple medical problems including IV adenocarcinoma of the lung (diagnosed 03/30/2019) metastatic to liver and bone and chest wall, also with stage IV HCC (diagnosed 07/17/2018). PMH also notable for HCV, history of alcohol use/tobacco abuse. Palliative care was consulted to help address goals and manage ongoing symptoms.   Observations/Objective: Called spoke with patient by phone.  He reports doing well.  He denies any significant changes or concerns.  No distressing symptoms reported today.  Patient had his CT scan today.  Plans for follow-up in the clinic to review results.  Assessment and Plan: Stage IV HCC-on treatment with carbo/Taxol/Avastin/Tecentriq and followed by Dr. Tasia Catchings.  Patient seems to be tolerating treatments.  Neoplasm related pain -continue fentanyl patch 25 mcg every 72 hours with use of oxycodone 5 mg every 8 hours as needed for breakthrough pain   Follow Up Instructions: Follow-up virtual visit in 1 to 2 months   I discussed the assessment and treatment plan with the patient. The patient was provided an opportunity to ask questions and all were answered. The patient agreed with the plan and demonstrated an understanding of the instructions.   The patient was advised to call back or seek an in-person evaluation if the symptoms worsen or if the condition fails to improve as  anticipated.  I provided 5 minutes of non-face-to-face time during this encounter.   Irean Hong, NP

## 2019-07-04 NOTE — Progress Notes (Signed)
Pharmacist Chemotherapy Monitoring - Follow Up Assessment    I verify that I have reviewed each item in the below checklist:  . Regimen for the patient is scheduled for the appropriate day and plan matches scheduled date. Marland Kitchen Appropriate non-routine labs are ordered dependent on drug ordered. . If applicable, additional medications reviewed and ordered per protocol based on lifetime cumulative doses and/or treatment regimen.   Plan for follow-up and/or issues identified: No . I-vent associated with next due treatment: No . MD and/or nursing notified: No  Samuel Hood K 07/04/2019 8:57 AM

## 2019-07-10 ENCOUNTER — Encounter: Payer: Self-pay | Admitting: Oncology

## 2019-07-10 NOTE — Progress Notes (Signed)
Patient prescreened for Mychart visit. Pt here reporting that chemo is making feel weak, tired and cold. He is also reporting discoloration to fingernails.

## 2019-07-11 ENCOUNTER — Inpatient Hospital Stay: Payer: Medicare Other

## 2019-07-11 ENCOUNTER — Inpatient Hospital Stay (HOSPITAL_BASED_OUTPATIENT_CLINIC_OR_DEPARTMENT_OTHER): Payer: Medicare Other | Admitting: Oncology

## 2019-07-11 ENCOUNTER — Other Ambulatory Visit: Payer: Self-pay

## 2019-07-11 VITALS — BP 114/89 | HR 114 | Temp 98.5°F | Resp 18 | Wt 136.0 lb

## 2019-07-11 DIAGNOSIS — M5137 Other intervertebral disc degeneration, lumbosacral region: Secondary | ICD-10-CM | POA: Diagnosis not present

## 2019-07-11 DIAGNOSIS — R100 Acute abdomen: Secondary | ICD-10-CM | POA: Diagnosis not present

## 2019-07-11 DIAGNOSIS — C7802 Secondary malignant neoplasm of left lung: Secondary | ICD-10-CM

## 2019-07-11 DIAGNOSIS — Z5111 Encounter for antineoplastic chemotherapy: Secondary | ICD-10-CM | POA: Diagnosis not present

## 2019-07-11 DIAGNOSIS — C3412 Malignant neoplasm of upper lobe, left bronchus or lung: Secondary | ICD-10-CM | POA: Diagnosis not present

## 2019-07-11 DIAGNOSIS — Z5112 Encounter for antineoplastic immunotherapy: Secondary | ICD-10-CM

## 2019-07-11 DIAGNOSIS — C7951 Secondary malignant neoplasm of bone: Secondary | ICD-10-CM | POA: Diagnosis not present

## 2019-07-11 DIAGNOSIS — G893 Neoplasm related pain (acute) (chronic): Secondary | ICD-10-CM

## 2019-07-11 DIAGNOSIS — R140 Abdominal distension (gaseous): Secondary | ICD-10-CM | POA: Diagnosis not present

## 2019-07-11 DIAGNOSIS — C3492 Malignant neoplasm of unspecified part of left bronchus or lung: Secondary | ICD-10-CM

## 2019-07-11 DIAGNOSIS — K5900 Constipation, unspecified: Secondary | ICD-10-CM | POA: Diagnosis not present

## 2019-07-11 DIAGNOSIS — M47816 Spondylosis without myelopathy or radiculopathy, lumbar region: Secondary | ICD-10-CM | POA: Diagnosis not present

## 2019-07-11 DIAGNOSIS — C220 Liver cell carcinoma: Secondary | ICD-10-CM

## 2019-07-11 DIAGNOSIS — R531 Weakness: Secondary | ICD-10-CM | POA: Diagnosis not present

## 2019-07-11 DIAGNOSIS — J439 Emphysema, unspecified: Secondary | ICD-10-CM | POA: Diagnosis not present

## 2019-07-11 DIAGNOSIS — E876 Hypokalemia: Secondary | ICD-10-CM | POA: Diagnosis not present

## 2019-07-11 DIAGNOSIS — Z95828 Presence of other vascular implants and grafts: Secondary | ICD-10-CM

## 2019-07-11 DIAGNOSIS — N281 Cyst of kidney, acquired: Secondary | ICD-10-CM | POA: Diagnosis not present

## 2019-07-11 DIAGNOSIS — C787 Secondary malignant neoplasm of liver and intrahepatic bile duct: Secondary | ICD-10-CM | POA: Diagnosis not present

## 2019-07-11 DIAGNOSIS — K449 Diaphragmatic hernia without obstruction or gangrene: Secondary | ICD-10-CM | POA: Diagnosis not present

## 2019-07-11 LAB — COMPREHENSIVE METABOLIC PANEL
ALT: 27 U/L (ref 0–44)
AST: 32 U/L (ref 15–41)
Albumin: 3 g/dL — ABNORMAL LOW (ref 3.5–5.0)
Alkaline Phosphatase: 64 U/L (ref 38–126)
Anion gap: 8 (ref 5–15)
BUN: 6 mg/dL — ABNORMAL LOW (ref 8–23)
CO2: 27 mmol/L (ref 22–32)
Calcium: 8.5 mg/dL — ABNORMAL LOW (ref 8.9–10.3)
Chloride: 102 mmol/L (ref 98–111)
Creatinine, Ser: 0.57 mg/dL — ABNORMAL LOW (ref 0.61–1.24)
GFR calc Af Amer: >60 mL/min (ref >60)
GFR calc non Af Amer: >60 mL/min (ref >60)
Glucose, Bld: 125 mg/dL — ABNORMAL HIGH (ref 70–99)
Potassium: 2.7 mmol/L — CL (ref 3.5–5.1)
Sodium: 137 mmol/L (ref 135–145)
Total Bilirubin: 0.8 mg/dL (ref 0.3–1.2)
Total Protein: 7.1 g/dL (ref 6.5–8.1)

## 2019-07-11 LAB — PROTEIN, URINE, RANDOM: Total Protein, Urine: 25 mg/dL

## 2019-07-11 LAB — CBC WITH DIFFERENTIAL/PLATELET
Abs Immature Granulocytes: 0.15 10*3/uL — ABNORMAL HIGH (ref 0.00–0.07)
Basophils Absolute: 0 10*3/uL (ref 0.0–0.1)
Basophils Relative: 1 %
Eosinophils Absolute: 0 10*3/uL (ref 0.0–0.5)
Eosinophils Relative: 1 %
HCT: 33.6 % — ABNORMAL LOW (ref 39.0–52.0)
Hemoglobin: 11.3 g/dL — ABNORMAL LOW (ref 13.0–17.0)
Immature Granulocytes: 2 %
Lymphocytes Relative: 15 %
Lymphs Abs: 1.2 10*3/uL (ref 0.7–4.0)
MCH: 28.9 pg (ref 26.0–34.0)
MCHC: 33.6 g/dL (ref 30.0–36.0)
MCV: 85.9 fL (ref 80.0–100.0)
Monocytes Absolute: 1.2 10*3/uL — ABNORMAL HIGH (ref 0.1–1.0)
Monocytes Relative: 16 %
Neutro Abs: 5 10*3/uL (ref 1.7–7.7)
Neutrophils Relative %: 65 %
Platelets: 315 10*3/uL (ref 150–400)
RBC: 3.91 MIL/uL — ABNORMAL LOW (ref 4.22–5.81)
RDW: 16.8 % — ABNORMAL HIGH (ref 11.5–15.5)
WBC: 7.6 10*3/uL (ref 4.0–10.5)
nRBC: 0.3 % — ABNORMAL HIGH (ref 0.0–0.2)

## 2019-07-11 LAB — MAGNESIUM: Magnesium: 1.7 mg/dL (ref 1.7–2.4)

## 2019-07-11 LAB — TSH: TSH: 2.13 u[IU]/mL (ref 0.350–4.500)

## 2019-07-11 MED ORDER — HEPARIN SOD (PORK) LOCK FLUSH 100 UNIT/ML IV SOLN
500.0000 [IU] | Freq: Once | INTRAVENOUS | Status: AC | PRN
  Administered 2019-07-11: 500 [IU]
  Filled 2019-07-11: qty 5

## 2019-07-11 MED ORDER — POTASSIUM CHLORIDE CRYS ER 20 MEQ PO TBCR: 20.0000 meq | EXTENDED_RELEASE_TABLET | Freq: Every day | ORAL | 0 refills | Status: DC

## 2019-07-11 MED ORDER — FENTANYL 25 MCG/HR TD PT72: 1.0000 | MEDICATED_PATCH | TRANSDERMAL | 0 refills | Status: DC

## 2019-07-11 MED ORDER — OXYCODONE HCL 5 MG PO TABS: 5.0000 mg | ORAL_TABLET | Freq: Three times a day (TID) | ORAL | 0 refills | Status: DC | PRN

## 2019-07-11 MED ORDER — SODIUM CHLORIDE 0.9% FLUSH
10.0000 mL | INTRAVENOUS | Status: DC | PRN
  Administered 2019-07-11: 10 mL via INTRAVENOUS
  Filled 2019-07-11: qty 10

## 2019-07-11 MED ORDER — POTASSIUM CHLORIDE CRYS ER 10 MEQ PO TBCR
20.0000 meq | EXTENDED_RELEASE_TABLET | Freq: Once | ORAL | Status: AC
  Administered 2019-07-11: 20 meq via ORAL
  Filled 2019-07-11: qty 2

## 2019-07-11 MED ORDER — SODIUM CHLORIDE 0.9 % IV SOLN
Freq: Once | INTRAVENOUS | Status: AC
  Filled 2019-07-11: qty 1000

## 2019-07-11 MED ORDER — SODIUM CHLORIDE 0.9 % IV SOLN
Freq: Once | INTRAVENOUS | Status: AC
  Filled 2019-07-11: qty 250

## 2019-07-11 NOTE — Progress Notes (Signed)
Hematology/Oncology follow up note [facility] [phone] [phone]   Patient Care Team: Jerrol Banana., MD as PCP - General (Family Medicine) Telford Nab, RN as Registered Nurse Clent Jacks, RN as Registered Nurse  REFERRING PROVIDER: Jerrol Banana.,*  CHIEF COMPLAINTS/REASON FOR VISIT:  Discussion of metastatic cancer management.  HISTORY OF PRESENTING ILLNESS:   Samuel Hood. is a  69 y.o.  male with PMH listed below was seen in consultation at the request of  Jerrol Banana.,*  for evaluation of lung nodule in the liver lesion. Patient has a past medical history of hypertension, alcohol abuse, smoking emphysema.  He has had serial CTs done in the past.  CT images were independently reviewed by me. 07/26/2017 CT chest with contrast showed extensive pleural-parenchymal scarring within the left upper lobe with several areas of soft tissue nodularity measuring up to 1.6 cm.  In this patient who is at increased risk of lung cancer further investigation with PET scan is recommended. Small chronic appearance loculated hydropneumothorax overlies the left apex. Slowly enlarging lesion within the central portion of the right lobe of liver identified. Per note, patient supposed to have additional work-up done in December repeat CT scan which he did not  Patient was recently seen by primary care provider and has subsequent image done for follow-up. CT on 06/30/2018 showed multiple significantly enlarged paraspinal mass are noted concerning.  The largest measures 3.8 x 0.5 cm and there appears to be a lytic destruction of the adjacent T11 vertebral body.  Also noted is new solid density measuring 17 x 12 mm in the pleural parenchymal density in the left upper lobe noted on prior exam.  Concerning for malignancy.  4.4 ascending thoracic aortic aneurysm.  Recommend annual imaging.  Patient had PET scan done on  07/06/2018 Which showed there are 2 FDG avid lesion within the left upper lobe worrisome for primary lung neoplasm.  There is evidence of chest wall involvement.  Metastasis to the posterior mediastinum is identified with extension into T11 vertebra and possible involvement of the left T12 neural foramina.  Patient reports left shoulder pain.  Smoking half a pack a day not motivated with further questioning quitting Denies weight loss, hemoptysis, cough.  Chronic shortness of breath with exertion. He drinks alcohol daily. Lives with his girlfriend.  He has 5 adult kids  #Hepatitis C  # 6/1?2020  CT-guided biopsy of left-sided posterior mediastinal soft tissue adjacent to T10/11. Patient's case was discussed on tumor board. Consensus was reached that [facility] versus primary lung cancer with hepatoid adenocarcinoma features. Consensus was to proceed with liver biopsy first as liver mass was not FDG avid on PET scan.  Patient underwent liver biopsy and the present to discuss pathology reports.  ## 08/08/2018 Liver mass biopsy showed fragments of necrotic and calcified tissue with adjacent fibrous capsule Scant viable nonneoplastic liver tissue with steatosis, inflammation and non specific fibrosis.  # 08/28/2018 CT guided left upper lobe lung mass biopsy showed poorly differentiated adenocarcinoma of lung origin.  6/19-08/16/2018  Finished palliative radiation to paraspinal soft tissue mass. 09/07/2018 patient was started on lenvatinib 12 mg daily. 10/13/2018 SBRT to lung cancer lesion.  #Patient is on Zometa monthly #He was advised to take calcium supplement.  He reports he quit taking calcium as he broke out rash on her scalp, neck and chest after taking calcium. # 03/30/19 liver lesion biopsy showed poorly differentiated adenocarcinoma, compatible with lung primary.  INTERVAL HISTORY Samuel Hood. is a 69 y.o. male who has above history reviewed by me today presents for follow up visit for  assessment of prior to chemotherapy for the treatment of Samuel Hood and lung cancer. He reports feeling tired and fatigue.  Denies any loose stool, cough, shortness of breath.  Patient uses fentanyl patch and oxycodone 5 mg as needed.  He requests a refill. He lost 2 pounds since last visit.  Appetite is not good  Review of Systems  Constitutional: Positive for appetite change and fatigue. Negative for chills, fever and unexpected weight change.  HENT:   Negative for hearing loss and voice change.   Eyes: Negative for eye problems and icterus.  Respiratory: Negative for chest tightness, cough and shortness of breath.   Cardiovascular: Negative for chest pain and leg swelling.  Gastrointestinal: Negative for abdominal distention, abdominal pain and constipation.  Endocrine: Negative for hot flashes.  Genitourinary: Negative for difficulty urinating, dysuria and frequency.   Musculoskeletal: Negative for arthralgias.  Skin: Negative for itching and rash.  Neurological: Negative for light-headedness and numbness.  Hematological: Negative for adenopathy. Does not bruise/bleed easily.  Psychiatric/Behavioral: Negative for confusion.    MEDICAL HISTORY:  Past Medical History:  Diagnosis Date  . Hepatocellular carcinoma metastatic to left lung ([place]) 07/24/2018  . History of angiography    left lower extremity  . Hypertension   . Small bowel obstruction ([place])     SURGICAL HISTORY: Past Surgical History:  Procedure Laterality Date  . COLON SURGERY    . COLONOSCOPY W/ POLYPECTOMY    . COLONOSCOPY WITH PROPOFOL N/A 01/31/2018   Procedure: COLONOSCOPY WITH PROPOFOL;  Surgeon: Toledo, Benay Pike, MD;  Location: [facility] ENDOSCOPY;  Service: Gastroenterology;  Laterality: N/A;  . debridement fasciotomy leg, left Left 03/19/2016  . LAPAROTOMY N/A 09/27/2014   Procedure: EXPLORATORY LAPAROTOMY;  Surgeon: Dia Crawford III, MD;  Location: [facility] ORS;  Service: General;  Laterality: N/A;  . PORTA CATH INSERTION  N/A 04/10/2019   Procedure: PORTA CATH INSERTION;  Surgeon: Katha Cabal, MD;  Location: [facility];  Service: Cardiovascular;  Laterality: N/A;    SOCIAL HISTORY: Social History   Socioeconomic History  . Marital status: Single    Spouse name: Not on file  . Number of children: Not on file  . Years of education: Not on file  . Highest education level: Not on file  Occupational History  . Occupation: retired  Tobacco Use  . Smoking status: Former Smoker    Packs/day: 0.25    Years: 50.00    Pack years: 12.50  . Smokeless tobacco: Never Used  . Tobacco comment: 1-2/week  Substance and Sexual Activity  . Alcohol use: Not Currently    Alcohol/week: 35.0 standard drinks    Types: 30 Cans of beer, 5 Shots of liquor per week  . Drug use: No  . Sexual activity: Not on file  Other Topics Concern  . Not on file  Social History Narrative  . Not on file   Social Determinants of Health   Financial Resource Strain: High Risk  . Difficulty of Paying Living Expenses: Hard  Food Insecurity: No Food Insecurity  . Worried About Charity fundraiser in the Last Year: Never true  . Ran Out of Food in the Last Year: Never true  Transportation Needs: No Transportation Needs  . Lack of Transportation (Medical): No  . Lack of Transportation (Non-Medical): No  Physical Activity: Sufficiently Active  . Days  of Exercise per Week: 7 days  . Minutes of Exercise per Session: 30 min  Stress: No Stress Concern Present  . Feeling of Stress : Not at all  Social Connections: Unknown  . Frequency of Communication with Friends and Family: More than three times a week  . Frequency of Social Gatherings with Friends and Family: More than three times a week  . Attends Religious Services: More than 4 times per year  . Active Member of Clubs or Organizations: Not on file  . Attends Archivist Meetings: Not on file  . Marital Status: Not on file  Intimate Partner Violence: Unknown   . Fear of Current or Ex-Partner: Patient refused  . Emotionally Abused: No  . Physically Abused: No  . Sexually Abused: No    FAMILY HISTORY: Family History  Problem Relation Age of Onset  . Cancer Mother   . Cancer Father     ALLERGIES:  is allergic to 5-alpha reductase inhibitors.  MEDICATIONS:  Current Outpatient Medications  Medication Sig Dispense Refill  . amLODipine (NORVASC) 5 MG tablet Take 1 tablet (5 mg total) by mouth daily. 30 tablet 0  . dexamethasone (DECADRON) 4 MG tablet Take 2 tablets (8 mg total) by mouth daily. Start the day after carboplatin chemotherapy for 3 days. 30 tablet 1  . fentaNYL (DURAGESIC) 25 MCG/HR Place 1 patch onto the skin every 3 (three) days. 10 patch 0  . lidocaine-prilocaine (EMLA) cream Apply to affected area once (Patient taking differently: Apply 1 application topically daily as needed (port acess). ) 30 g 3  . loperamide (IMODIUM) 2 MG capsule Take 1 capsule (2 mg total) by mouth See admin instructions. With onset of loose stool, take 29m followed by 267mevery 2 hours until 12 hours have passed without loose bowel movement. Maximum: 16 mg/day 60 capsule 1  . Multiple Vitamin (MULTIVITAMIN) capsule Take 1 capsule by mouth daily.    . Marland KitchenARCAN 4 MG/0.1ML LIQD nasal spray kit 1 spray once.    . ondansetron (ZOFRAN) 8 MG tablet Take 1 tablet (8 mg total) by mouth 2 (two) times daily as needed for refractory nausea / vomiting. Start on day 3 after carboplatin chemo. 30 tablet 1  . oxyCODONE (ROXICODONE) 5 MG immediate release tablet Take 1 tablet (5 mg total) by mouth every 6 (six) hours as needed for severe pain or breakthrough pain. 60 tablet 0  . prochlorperazine (COMPAZINE) 10 MG tablet Take 1 tablet (10 mg total) by mouth every 6 (six) hours as needed (Nausea or vomiting). 30 tablet 1  . senna (SENOKOT) 8.6 MG TABS tablet Take 1 tablet (8.6 mg total) by mouth daily. 120 tablet 0  . polyethylene glycol (MIRALAX / GLYCOLAX) 17 g packet Take 17 g  by mouth daily. (Patient not taking: Reported on 05/30/2019) 14 each 0  . potassium chloride SA (KLOR-CON M20) 20 MEQ tablet Take 1 tablet (20 mEq total) by mouth daily. (Patient not taking: Reported on 07/10/2019) 21 tablet 0   No current facility-administered medications for this visit.   Facility-Administered Medications Ordered in Other Visits  Medication Dose Route Frequency Provider Last Rate Last Admin  . sodium chloride flush (NS) 0.9 % injection 10 mL  10 mL Intravenous PRN FiLloyd HugerMD         PHYSICAL EXAMINATION: ECOG PERFORMANCE STATUS: 1 - Symptomatic but completely ambulatory Vitals:   07/11/19 0847  BP: 114/89  Pulse: (!) 114  Resp: 18  Temp: 98.5 F (36.9  C)  SpO2: 100%   Filed Weights   07/11/19 0847  Weight: 136 lb (61.7 kg)    Physical Exam Constitutional:      General: He is not in acute distress. HENT:     Head: Normocephalic and atraumatic.  Eyes:     General: No scleral icterus. Cardiovascular:     Rate and Rhythm: Normal rate and regular rhythm.     Heart sounds: Normal heart sounds.  Pulmonary:     Effort: Pulmonary effort is normal. No respiratory distress.     Breath sounds: No wheezing.  Abdominal:     General: Bowel sounds are normal. There is no distension.     Palpations: Abdomen is soft.  Musculoskeletal:        General: No deformity. Normal range of motion.     Cervical back: Normal range of motion and neck supple.  Skin:    General: Skin is warm and dry.     Findings: No erythema or rash.  Neurological:     Mental Status: He is alert and oriented to person, place, and time. Mental status is at baseline.     Cranial Nerves: No cranial nerve deficit.     Coordination: Coordination normal.  Psychiatric:        Mood and Affect: Mood normal.     LABORATORY DATA:  I have reviewed the data as listed Lab Results  Component Value Date   WBC 5.6 06/20/2019   HGB 12.3 (L) 06/20/2019   HCT 36.4 (L) 06/20/2019   MCV 88.6  06/20/2019   PLT 232 06/20/2019   Recent Labs    05/09/19 0810 05/14/19 0527 05/30/19 0824 06/06/19 1051 06/20/19 0754  NA 137   < > 140 137 139  K 3.4*   < > 2.9* 3.5 3.9  CL 103   < > 103 101 105  CO2 24   < > _0 GLUCOSE 104*   < > 111* 100* 98  BUN 7*   < > 7* 13 7*  CREATININE 0.74   < > 0.59* 0.61 0.67  CALCIUM 8.7*   < > 8.9 9.0 8.8*  GFRNONAA >60   < > >60 >60 >60  GFRAA >60   < > >60 >60 >60  PROT 7.6  --  7.4  --  6.7  ALBUMIN 3.4*  --  3.2*  --  3.3*  AST 43*  --  34  --  42*  ALT 40  --  33  --  42  ALKPHOS 86  --  87  --  82  BILITOT 0.5  --  0.5  --  0.5   < > = values in this interval not displayed.   Iron/TIBC/Ferritin/ %Sat No results found for: IRON, TIBC, FERRITIN, IRONPCTSAT   RADIOGRAPHIC STUDIES: I have personally reviewed the radiological images as listed and agreed with the findings in the report. CT Chest W Contrast  Result Date: 07/04/2019 CLINICAL DATA:  Stage IV adenocarcinoma of the lung. EXAM: CT CHEST, ABDOMEN, AND PELVIS WITH CONTRAST TECHNIQUE: Multidetector CT imaging of the chest, abdomen and pelvis was performed following the standard protocol during bolus administration of intravenous contrast. CONTRAST:  109m OMNIPAQUE IOHEXOL 300 MG/ML  SOLN COMPARISON:  03/26/2019 FINDINGS: CT CHEST FINDINGS Cardiovascular: The heart size is normal. No substantial pericardial effusion. Coronary artery calcification is evident. Atherosclerotic calcification is noted in the wall of the thoracic aorta. Ascending thoracic aorta measures 4.1 cm diameter. Right-sided Port-A-Cath is looped  in the right jugular vein with the tip directed centrally at the innominate vein confluence. Mediastinum/Nodes: No mediastinal lymphadenopathy. There is no hilar lymphadenopathy. There is no axillary lymphadenopathy. The esophagus has normal imaging features. Lungs/Pleura: Centrilobular and paraseptal emphysema evident. Bullous change noted in the apices, left greater than  right. Architectural distortion with patchy ill-defined consolidative opacity in the anterior left upper lobe is similar to prior and compatible with sequelae of prior therapy. No new suspicious pulmonary nodule or mass. No pleural effusion. Musculoskeletal: Lucent lesion in the T11 vertebral body is stable. Small focus of left paraspinal soft tissue at T11 is unchanged. CT ABDOMEN PELVIS FINDINGS Hepatobiliary: Central liver mass (segment VIII) is stable at 2.9 x 2.8 cm today compared to 3.1 x 2.9 cm previously. Smaller immediately adjacent lesion measures 1.3 x 1.4 cm today (59/2) compared to 1.5 x 1.5 cm previously. Ill-defined hypoenhancing lesion posterior right liver (66/2) measures 1.5 x 1.5 cm today compared to 2.2 x 2.4 cm previously. A very subtle 7 mm hypoattenuating lesion in the posterior right liver identified on the previous study is not discernible on today's exam. Calcified gallstones again noted. No intrahepatic or extrahepatic biliary dilation. Pancreas: No focal mass lesion. No dilatation of the main duct. No intraparenchymal cyst. No peripancreatic edema. Spleen: No splenomegaly. No focal mass lesion. Adrenals/Urinary Tract: No adrenal nodule or mass. Small right renal cysts are stable. Left kidney unremarkable. No evidence for hydroureter. The urinary bladder appears normal for the degree of distention. Stomach/Bowel: Stomach is unremarkable. No gastric wall thickening. No evidence of outlet obstruction. Duodenum is normally positioned as is the ligament of Treitz. No small bowel wall thickening. No small bowel dilatation. The terminal ileum is normal. The appendix is not visualized, but there is no edema or inflammation in the region of the cecum. Left colon anastomosis again noted. Vascular/Lymphatic: There is abdominal aortic atherosclerosis without aneurysm. There is no gastrohepatic or hepatoduodenal ligament lymphadenopathy. No retroperitoneal or mesenteric lymphadenopathy. No pelvic  sidewall lymphadenopathy. Reproductive: The prostate gland and seminal vesicles are unremarkable. Other: No intraperitoneal free fluid. Musculoskeletal: No worrisome lytic or sclerotic osseous abnormality. IMPRESSION: 1. No new or progressive findings in the chest, abdomen, or pelvis. 2. Hepatic metastases have decreased in the interval and a very subtle 7 mm hypoattenuating lesion in the posterior right liver identified on the previous study is not discernible on today's exam. 3. Stable appearance of the lucent lesion in the T11 vertebral body with no change in the small focus of adjacent left paraspinal soft tissue attenuation. 4. The right-sided Port-A-Cath is looped in the right internal jugular vein with the tip directed centrally at the level of the innominate vein confluence. 5. Cholelithiasis. 6. Ascending thoracic aortic aneurysm at 4.1 cm diameter. Continued attention on follow-up recommended. 7. Aortic Atherosclerosis (ICD10-I70.0) and Emphysema (ICD10-J43.9). Electronically Signed   By: Misty Stanley M.D.   On: 07/04/2019 09:07   NM Bone Scan Whole Body  Result Date: 04/14/2019 CLINICAL DATA:  Stage IV lung cancer.  No recent falls or trauma. EXAM: NUCLEAR MEDICINE WHOLE BODY BONE SCAN TECHNIQUE: Whole body anterior and posterior images were obtained approximately 3 hours after intravenous injection of radiopharmaceutical. RADIOPHARMACEUTICALS:  22.9 mCi Technetium-69mMDP IV COMPARISON:  03/26/2019 chest abdomen and pelvic CTs. FINDINGS: Degenerative changes of the left knee. No abnormal uptake within the axial or appendicular skeleton otherwise. Physiologic renal and bladder excretion. IMPRESSION: No findings of osseous metastasis. Electronically Signed   By: KAdria DevonD.  On: 04/14/2019 09:54   CT Abdomen Pelvis W Contrast  Result Date: 07/04/2019 CLINICAL DATA:  Stage IV adenocarcinoma of the lung. EXAM: CT CHEST, ABDOMEN, AND PELVIS WITH CONTRAST TECHNIQUE: Multidetector CT imaging of  the chest, abdomen and pelvis was performed following the standard protocol during bolus administration of intravenous contrast. CONTRAST:  64m OMNIPAQUE IOHEXOL 300 MG/ML  SOLN COMPARISON:  03/26/2019 FINDINGS: CT CHEST FINDINGS Cardiovascular: The heart size is normal. No substantial pericardial effusion. Coronary artery calcification is evident. Atherosclerotic calcification is noted in the wall of the thoracic aorta. Ascending thoracic aorta measures 4.1 cm diameter. Right-sided Port-A-Cath is looped in the right jugular vein with the tip directed centrally at the innominate vein confluence. Mediastinum/Nodes: No mediastinal lymphadenopathy. There is no hilar lymphadenopathy. There is no axillary lymphadenopathy. The esophagus has normal imaging features. Lungs/Pleura: Centrilobular and paraseptal emphysema evident. Bullous change noted in the apices, left greater than right. Architectural distortion with patchy ill-defined consolidative opacity in the anterior left upper lobe is similar to prior and compatible with sequelae of prior therapy. No new suspicious pulmonary nodule or mass. No pleural effusion. Musculoskeletal: Lucent lesion in the T11 vertebral body is stable. Small focus of left paraspinal soft tissue at T11 is unchanged. CT ABDOMEN PELVIS FINDINGS Hepatobiliary: Central liver mass (segment VIII) is stable at 2.9 x 2.8 cm today compared to 3.1 x 2.9 cm previously. Smaller immediately adjacent lesion measures 1.3 x 1.4 cm today (59/2) compared to 1.5 x 1.5 cm previously. Ill-defined hypoenhancing lesion posterior right liver (66/2) measures 1.5 x 1.5 cm today compared to 2.2 x 2.4 cm previously. A very subtle 7 mm hypoattenuating lesion in the posterior right liver identified on the previous study is not discernible on today's exam. Calcified gallstones again noted. No intrahepatic or extrahepatic biliary dilation. Pancreas: No focal mass lesion. No dilatation of the main duct. No intraparenchymal  cyst. No peripancreatic edema. Spleen: No splenomegaly. No focal mass lesion. Adrenals/Urinary Tract: No adrenal nodule or mass. Small right renal cysts are stable. Left kidney unremarkable. No evidence for hydroureter. The urinary bladder appears normal for the degree of distention. Stomach/Bowel: Stomach is unremarkable. No gastric wall thickening. No evidence of outlet obstruction. Duodenum is normally positioned as is the ligament of Treitz. No small bowel wall thickening. No small bowel dilatation. The terminal ileum is normal. The appendix is not visualized, but there is no edema or inflammation in the region of the cecum. Left colon anastomosis again noted. Vascular/Lymphatic: There is abdominal aortic atherosclerosis without aneurysm. There is no gastrohepatic or hepatoduodenal ligament lymphadenopathy. No retroperitoneal or mesenteric lymphadenopathy. No pelvic sidewall lymphadenopathy. Reproductive: The prostate gland and seminal vesicles are unremarkable. Other: No intraperitoneal free fluid. Musculoskeletal: No worrisome lytic or sclerotic osseous abnormality. IMPRESSION: 1. No new or progressive findings in the chest, abdomen, or pelvis. 2. Hepatic metastases have decreased in the interval and a very subtle 7 mm hypoattenuating lesion in the posterior right liver identified on the previous study is not discernible on today's exam. 3. Stable appearance of the lucent lesion in the T11 vertebral body with no change in the small focus of adjacent left paraspinal soft tissue attenuation. 4. The right-sided Port-A-Cath is looped in the right internal jugular vein with the tip directed centrally at the level of the innominate vein confluence. 5. Cholelithiasis. 6. Ascending thoracic aortic aneurysm at 4.1 cm diameter. Continued attention on follow-up recommended. 7. Aortic Atherosclerosis (ICD10-I70.0) and Emphysema (ICD10-J43.9). Electronically Signed   By: EVerda CuminsD.  On: 07/04/2019 09:07   DG  Abdomen Acute W/Chest  Result Date: 05/14/2019 CLINICAL DATA:  Abdominal distension and constipation EXAM: DG ABDOMEN ACUTE W/ 1V CHEST COMPARISON:  03/26/2019 CT FINDINGS: Normal heart size and stable mediastinal contours. Post treatment scarring in the left upper lobe. There is no edema, consolidation, effusion, or pneumothorax. Porta catheter with tip at the upper SVC. Moderate stool volume seen along the flanks. No worrisome bowel dilatation. No concerning mass effect or gas collection. Lumbar spine degeneration IMPRESSION: Nonobstructive bowel gas pattern with moderate stool volume. Electronically Signed   By: Monte Fantasia M.D.   On: 05/14/2019 07:16      ASSESSMENT & PLAN:  1. Primary lung adenocarcinoma, left ([place])   2. Hepatocellular carcinoma metastatic to left lung ([place])   3. Encounter for antineoplastic chemotherapy   4. Neoplasm related pain   5. Encounter for antineoplastic immunotherapy   6. Hypokalemia    #Stage IV Lung adenocarcinoma, Stage IV HCC-07/17/2018 paraspinal soft tissue biopsy Patient is on palliative chemotherapy with carboplatin, Taxol, bevacizumab, Tecentriq. Overall he tolerated well. Hold treatment due to weakness and fatigue today, and also severe hypokalemia. Interim CT was independently reviewed by me and discussed with patient. No new or progressive findings. Hepatic metastases have decreased during the interval. Stable appearance on the lucent lesion in the T11 vertebral body. Continue current regimen.  #Severe hypokalemia, potassium level is 2.7. Patient will receive IV potassium chloride 20 meq x 1 along with IV fluid, p.o. potassium chloride 20 mEq x 1 I sent prescription of potassium chloride 20 mEq to his pharmacy and advised patient to take another pill when he gets home and continue once daily Magnesium was checked and was at 1.7.  #Weight loss, likely due to decreased oral intake. Patient will receive a liter of normal saline  today. #Neoplasm related pain, continue fentanyl patch and oxycodone.   #Continue follow-up with palliative care service. Patient to follow-up in 1 week with reevaluation and next cycle of chemotherapy. We spent sufficient time to discuss many aspect of care, questions were answered to patient's satisfaction. All questions were answered. The patient knows to call the clinic with any problems questions or concerns.   Earlie Server, MD, PhD 07/11/2019

## 2019-07-11 NOTE — Progress Notes (Signed)
1000: Per Benjamine Mola RN per Dr. Tasia Catchings, No treatment at this time. Pt to receive IVFs, IV Potassium and PO potassium. **see MAR for orders**.

## 2019-07-12 ENCOUNTER — Encounter: Payer: Self-pay | Admitting: Adult Health

## 2019-07-12 ENCOUNTER — Ambulatory Visit (INDEPENDENT_AMBULATORY_CARE_PROVIDER_SITE_OTHER): Payer: Medicare Other | Admitting: Adult Health

## 2019-07-12 VITALS — BP 120/80 | HR 130 | Temp 96.8°F | Resp 16 | Ht 65.0 in | Wt 135.2 lb

## 2019-07-12 DIAGNOSIS — K732 Chronic active hepatitis, not elsewhere classified: Secondary | ICD-10-CM

## 2019-07-12 DIAGNOSIS — I10 Essential (primary) hypertension: Secondary | ICD-10-CM

## 2019-07-12 DIAGNOSIS — C220 Liver cell carcinoma: Secondary | ICD-10-CM

## 2019-07-12 DIAGNOSIS — J439 Emphysema, unspecified: Secondary | ICD-10-CM

## 2019-07-12 DIAGNOSIS — C7802 Secondary malignant neoplasm of left lung: Secondary | ICD-10-CM

## 2019-07-12 DIAGNOSIS — C3492 Malignant neoplasm of unspecified part of left bronchus or lung: Secondary | ICD-10-CM

## 2019-07-12 DIAGNOSIS — I712 Thoracic aortic aneurysm, without rupture, unspecified: Secondary | ICD-10-CM | POA: Insufficient documentation

## 2019-07-12 DIAGNOSIS — R160 Hepatomegaly, not elsewhere classified: Secondary | ICD-10-CM

## 2019-07-12 DIAGNOSIS — Z125 Encounter for screening for malignant neoplasm of prostate: Secondary | ICD-10-CM

## 2019-07-12 DIAGNOSIS — D649 Anemia, unspecified: Secondary | ICD-10-CM | POA: Insufficient documentation

## 2019-07-12 DIAGNOSIS — Z13220 Encounter for screening for lipoid disorders: Secondary | ICD-10-CM

## 2019-07-12 NOTE — Patient Instructions (Signed)
Health Maintenance, Male Adopting a healthy lifestyle and getting preventive care are important in promoting health and wellness. Ask your health care provider about:  The right schedule for you to have regular tests and exams.  Things you can do on your own to prevent diseases and keep yourself healthy. What should I know about diet, weight, and exercise? Eat a healthy diet   Eat a diet that includes plenty of vegetables, fruits, low-fat dairy products, and lean protein.  Do not eat a lot of foods that are high in solid fats, added sugars, or sodium. Maintain a healthy weight Body mass index (BMI) is a measurement that can be used to identify possible weight problems. It estimates body fat based on height and weight. Your health care provider can help determine your BMI and help you achieve or maintain a healthy weight. Get regular exercise Get regular exercise. This is one of the most important things you can do for your health. Most adults should:  Exercise for at least 150 minutes each week. The exercise should increase your heart rate and make you sweat (moderate-intensity exercise).  Do strengthening exercises at least twice a week. This is in addition to the moderate-intensity exercise.  Spend less time sitting. Even light physical activity can be beneficial. Watch cholesterol and blood lipids Have your blood tested for lipids and cholesterol at 67 years of age, then have this test every 5 years. You may need to have your cholesterol levels checked more often if:  Your lipid or cholesterol levels are high.  You are older than 67 years of age.  You are at high risk for heart disease. What should I know about cancer screening? Many types of cancers can be detected early and may often be prevented. Depending on your health history and family history, you may need to have cancer screening at various ages. This may include screening for:  Colorectal cancer.  Prostate cancer.   Skin cancer.  Lung cancer. What should I know about heart disease, diabetes, and high blood pressure? Blood pressure and heart disease  High blood pressure causes heart disease and increases the risk of stroke. This is more likely to develop in people who have high blood pressure readings, are of African descent, or are overweight.  Talk with your health care provider about your target blood pressure readings.  Have your blood pressure checked: ? Every 3-5 years if you are 18-39 years of age. ? Every year if you are 40 years old or older.  If you are between the ages of 65 and 75 and are a current or former smoker, ask your health care provider if you should have a one-time screening for abdominal aortic aneurysm (AAA). Diabetes Have regular diabetes screenings. This checks your fasting blood sugar level. Have the screening done:  Once every three years after age 45 if you are at a normal weight and have a low risk for diabetes.  More often and at a younger age if you are overweight or have a high risk for diabetes. What should I know about preventing infection? Hepatitis B If you have a higher risk for hepatitis B, you should be screened for this virus. Talk with your health care provider to find out if you are at risk for hepatitis B infection. Hepatitis C Blood testing is recommended for:  Everyone born from 1945 through 1965.  Anyone with known risk factors for hepatitis C. Sexually transmitted infections (STIs)  You should be screened each year   for STIs, including gonorrhea and chlamydia, if: ? You are sexually active and are younger than 67 years of age. ? You are older than 67 years of age and your health care provider tells you that you are at risk for this type of infection. ? Your sexual activity has changed since you were last screened, and you are at increased risk for chlamydia or gonorrhea. Ask your health care provider if you are at risk.  Ask your health care  provider about whether you are at high risk for HIV. Your health care provider may recommend a prescription medicine to help prevent HIV infection. If you choose to take medicine to prevent HIV, you should first get tested for HIV. You should then be tested every 3 months for as long as you are taking the medicine. Follow these instructions at home: Lifestyle  Do not use any products that contain nicotine or tobacco, such as cigarettes, e-cigarettes, and chewing tobacco. If you need help quitting, ask your health care provider.  Do not use street drugs.  Do not share needles.  Ask your health care provider for help if you need support or information about quitting drugs. Alcohol use  Do not drink alcohol if your health care provider tells you not to drink.  If you drink alcohol: ? Limit how much you have to 0-2 drinks a day. ? Be aware of how much alcohol is in your drink. In the [place] one drink equals one 12 oz bottle of beer (355 mL), one 5 oz glass of wine (148 mL), or one 1 oz glass of hard liquor (44 mL). General instructions  Schedule regular health, dental, and eye exams.  Stay current with your vaccines.  Tell your health care provider if: ? You often feel depressed. ? You have ever been abused or do not feel safe at home. Summary  Adopting a healthy lifestyle and getting preventive care are important in promoting health and wellness.  Follow your health care provider's instructions about healthy diet, exercising, and getting tested or screened for diseases.  Follow your health care provider's instructions on monitoring your cholesterol and blood pressure. This information is not intended to replace advice given to you by your health care provider. Make sure you discuss any questions you have with your health care provider. Document Revised: 01/25/2018 Document Reviewed: 01/25/2018 Elsevier Patient Education  2020 [place]. Managing Chemotherapy Side Effects, Adult  Chemotherapy is a treatment that uses medicine to kill cancer cells. Chemotherapy causes side effects. The specific side effects depend on the specific medicines used. Most of the side effects of chemotherapy go away once treatment is finished. Until then, work closely with your health care providers and take an active role in managing your side effects. What are common side effects?  Tiredness (fatigue).  Increased risk of infections, bruising, or bleeding.  Nausea and vomiting.  Constipation or diarrhea.  Appetite loss.  Hair loss.  Mouth or throat sores.  Tingling, pain, or numbness in the hands and feet.  Dry, sensitive, itchy, or sore skin.  Confusion, anxiety, or mood swings.  Memory changes. How can I help manage my side effects? Medicines  Take over-the-counter and prescription medicines only as told by your health care provider.  Talk with your health care provider before taking vitamins, supplements, and over-the-counter medicines. Some of these can interfere with chemotherapy. Activity   Get plenty of rest.  Get regular exercise by doing activities such as walking, gentle yoga, or tai chi.  Return to your normal activities as told by your health care provider. Ask your health care provider what activities are safe for you. Eating and drinking  Talk to a dietitian about what you should eat and drink during cancer treatment.  Drink enough fluid to keep your urine pale yellow.  If you have side effects that affect eating, these tips may help: ? Eat smaller meals and snacks often. ? Drink high-nutrition and high-calorie shakes or supplements. ? Eat bland and soft foods that are easy to eat. ? Do not eat foods that are hot, spicy, or hard to swallow.  Do not eat raw or undercooked meat, eggs, or seafood.  Always wash fresh fruits and vegetables well before eating them. General instructions  Learn as much as you can about your condition.  Keep all  follow-up visits as told by your health care provider. This is important.  Use mouth rinse only as told by your health care provider.  Protect your skin from the sun by using sunscreen or wearing protective clothing and a hat.  If you have sore or itchy skin: ? Wear soft, comfortable clothing. ? Apply creams and ointments to your skin as told by your health care provider.  If you lose your hair, wear a wig, hat, or scarf to cover your head. You may want to have someone shave your head as you start to lose hair.  Meet with a hair and skin care specialist for makeup and skin care tips. How can I prevent infection and bleeding? Chemotherapy may lower your blood counts and put you at risk for infection and bleeding. Here are some ways to help prevent problems. Vaccines  Talk to your health care provider about vaccines. You should not get any live vaccines, such as the polio, MMR, chicken pox, and shingles vaccines. Do not be around people who have had live vaccines.  Make sure you get a yearly flu shot. People who will be near you should also get a yearly flu shot. Social activity  Stay away from crowded places where you could be exposed to germs.  Do not be around people who may be sick.  Do not share food or utensils with other people.  Wear a mask when outside the home if your blood counts are low. Cleanliness   Wash your hands often. Also make sure that other members of your household wash their hands often.  Brush your teeth daily using a soft toothbrush. General tips  Take your temperature regularly, especially if you have chills or feel warm.  Check with your health care provider before you: ? Travel. ? Have a dental procedure.  If you get chemotherapy through an IV or port, check the site every day for signs of infection. Check for redness, swelling, pain, fluid, and warmth.  Avoid activities that put you at risk for injury.  Use an electric razor to shave instead  of a blade. Questions to ask your health care provider  What are the most common side effects of my treatment?  How will they affect my daily life?  What can I do to manage them?  When can I expect them to end?  What are some possible long-term side effects?  What are possible complications?  What support services are available?  When should I contact my cancer care provider?  What number can I call with questions or concerns? Where to find support Cancer affects the entire family. Find out what family support resources are available  from your cancer treatment center. For more support, turn to:  Your cancer care team.  Friends and family.  Your religious community.  Other people with cancer.  Online support groups. Where to find more information  [place]: www.cancer.gov  [employer]: www.cancer.org Contact a health care provider if:  You bleed or bruise often.  You have: ? A skin rash or dry or itchy skin. ? A headache or stiff neck. ? Cold or flu symptoms. ? A cough. ? Nausea or vomiting. ? Diarrhea. ? Frequent urination, burning when passing urine, or foul-smelling urine. ? Blood in your urine or stool.  You cannot eat because of mouth or throat pain.  You are sad, confused, anxious, or depressed. Get help right away if:  You have: ? A fever. ? Redness, swelling, pain, fluid, or warmth near an IV site. ? Bleeding that you cannot stop. ? A seizure.  You cannot swallow.  You have chest pain.  You have trouble breathing. Summary  Chemotherapy is a treatment that uses medicine to kill cancer cells. Chemotherapy causes side effects. The specific side effects depend on the specific medicines used.  Learn as much as you can about your condition. Ask about side effects to watch for and how to treat them.  Seek out support and resources from others. Find out what family support resources are available from your cancer  treatment center.  Let your health care provider know if you notice any new or unusual symptoms. This information is not intended to replace advice given to you by your health care provider. Make sure you discuss any questions you have with your health care provider. Document Revised: 04/28/2017 Document Reviewed: 04/28/2017 Elsevier Patient Education  [place].

## 2019-07-12 NOTE — Progress Notes (Signed)
New Patient Office Visit  Subjective:  Patient ID: Samuel Hood., male    DOB: 1950-07-17  Age: 69 y.o. MRN: [id number]  CC:  Chief Complaint  Patient presents with  . New Patient (Initial Visit)    HPI Samuel Lamba. presents for visit in the office, he has adenocarcinoma of the lung.  He was found to have lung nodule in liver lesion.  Patient has a past medical history of alcohol abuse, smoking, emphysema, hypertension.  History of elevated PSA. He does endorse left shoulder pain that he has had in the past however he is not complaining of this today.  He reports is actually feeling better than it was in the past.  Denies any chest pain, or edema.  Denies any injury.  He was seen last in the office by Lydia Guiles, PA on 4/0/9811 visits for hypertension recheck.  He was being followed by oncology at that time.  He was educated about living wills and advance directives healthcare power of attorney and DNR's but wanted to think on those issues.  He reports he is not done any of those at this point he is aware of the need to do so.  He was then on amlodipine 5 mg daily.  His PSA at that time was elevated however with a metastatic HCC no further work-up was wanted at that time.  So does not want work-up but he would like to have his PSA checked.  Patient also has chronic active hepatitis, Dr. Allen Norris gastrointestinal doctor had been consulted with for his active hepatitis, and patient currently at that time did not want a referral.  He also states today that he is not want a referral.  He reports he lives with his girlfriend, he has 5 grandchildren.  He reports he sees Dr. Tasia Catchings at the cancer center, he has had increased fatigue since starting chemotherapy.  He is palliative chemotherapy.  His potassium was recently 2.3 he was given oral potassium by Dr. Tasia Catchings and told to come back to the clinic next week for follow-up labs and possibly treatment.  He was unable to get his chemotherapy yesterday due  to hypokalemia.  Patient denies any chest pain or increased muscle fatigue.  Denies any palpitations. Provider did review Dr. Collie Siad note from 07/11/2019  and does make note that patient received IV potassium chloride 20 mEq x 1 along with an IV fluid and then p.o. potassium chloride 20 mEq x 1.  And then he was to repeat that dose of oral potassium that afternoon.  He was also also sent a prescription for potassium chloride 20 mEq to take daily.  Magnesium was 1.7.  Does report that his appetite is decreasing.  He is using a fentanyl patch and oxycodone 5 mg as needed.  Dr. Tasia Catchings has refilled these recently.  Patient has bottles with him today.  He denies any weight loss or hemoptysis.  He does have an occasional cough.  He has chronic shortness of breath with exertion.  He does drink alcohol daily.  CT on 06/30/2018 showed multiple significantly enlarged paraspinal masses that are concerning.  The largest measures 3.8 x 0.5 cm and there appears to be a lytic destruction of the adjacent T11 vertebral body.  Also noted is new solid density measuring 17 x 12 mm in the pleural paronychia density in the left upper lobe noted on prior exam.  Discerning for malignancy 4.4 ascending thoracic aortic aneurysm annual imaging is recommended.  Repeat  was done 03/26/2019 and above aneurysm was noted stable and measured at 4.2 cm in diameter.   Patient had a PET scan done on 07/06/2018 which showed there are 2 lesions within the left upper lobe worrisome for primary lung neoplasm.  There is evidence of chest wall involvement.  Metastasis to the posterior mediastinotomy is identified with extension into the T11 vertebral and possible involvement of the left T12 neuro foramina.  His liver lesion biopsy also showed poorly differentiated adenocarcinoma compatible with primary lung cancer her oncologist. Denies any testicular lumps or urinary symptoms.   Denies any constipation, Denies any rectal bleeding or pain. 2019 was last  repeat in 5 years.    He reports other than the chemotherapy he feels well.  He is sexually active- declined STD testing or need.  Past Medical History:  Diagnosis Date  . Hepatocellular carcinoma metastatic to left lung ([place]) 07/24/2018  . History of angiography    left lower extremity  . Hypertension   . Small bowel obstruction [facility])     Past Surgical History:  Procedure Laterality Date  . COLON SURGERY    . COLONOSCOPY W/ POLYPECTOMY    . COLONOSCOPY WITH PROPOFOL N/A 01/31/2018   Procedure: COLONOSCOPY WITH PROPOFOL;  Surgeon: Toledo, Benay Pike, MD;  Location: [facility] ENDOSCOPY;  Service: Gastroenterology;  Laterality: N/A;  . debridement fasciotomy leg, left Left 03/19/2016  . LAPAROTOMY N/A 09/27/2014   Procedure: EXPLORATORY LAPAROTOMY;  Surgeon: Dia Crawford III, MD;  Location: [facility] ORS;  Service: General;  Laterality: N/A;  . PORTA CATH INSERTION N/A 04/10/2019   Procedure: PORTA CATH INSERTION;  Surgeon: Katha Cabal, MD;  Location: [facility];  Service: Cardiovascular;  Laterality: N/A;    Family History  Problem Relation Age of Onset  . Cancer Mother   . Cancer Father     Social History   Socioeconomic History  . Marital status: Single    Spouse name: Not on file  . Number of children: Not on file  . Years of education: Not on file  . Highest education level: Not on file  Occupational History  . Occupation: retired  Tobacco Use  . Smoking status: Former Smoker    Packs/day: 0.25    Years: 50.00    Pack years: 12.50  . Smokeless tobacco: Never Used  . Tobacco comment: 1-2/week  Substance and Sexual Activity  . Alcohol use: Not Currently    Alcohol/week: 35.0 standard drinks    Types: 30 Cans of beer, 5 Shots of liquor per week  . Drug use: No  . Sexual activity: Not on file  Other Topics Concern  . Not on file  Social History Narrative  . Not on file   Social Determinants of Health   Financial Resource Strain: High Risk  . Difficulty  of Paying Living Expenses: Hard  Food Insecurity: No Food Insecurity  . Worried About Charity fundraiser in the Last Year: Never true  . Ran Out of Food in the Last Year: Never true  Transportation Needs: No Transportation Needs  . Lack of Transportation (Medical): No  . Lack of Transportation (Non-Medical): No  Physical Activity: Sufficiently Active  . Days of Exercise per Week: 7 days  . Minutes of Exercise per Session: 30 min  Stress: No Stress Concern Present  . Feeling of Stress : Not at all  Social Connections: Unknown  . Frequency of Communication with Friends and Family: More than three times a week  . Frequency  of Social Gatherings with Friends and Family: More than three times a week  . Attends Religious Services: More than 4 times per year  . Active Member of Clubs or Organizations: Not on file  . Attends Archivist Meetings: Not on file  . Marital Status: Not on file  Intimate Partner Violence: Unknown  . Fear of Current or Ex-Partner: Patient refused  . Emotionally Abused: No  . Physically Abused: No  . Sexually Abused: No    ROS Review of Systems  Constitutional: Positive for appetite change (decreased with chemotherapy ), fatigue and unexpected weight change (loss ).  Respiratory: Positive for shortness of breath (chronic with lung cancer ). Negative for apnea and chest tightness.   Cardiovascular: Negative for chest pain, palpitations and leg swelling.  Gastrointestinal:       Denied   Genitourinary: Negative.        Denies   Musculoskeletal: Positive for arthralgias and myalgias.  Neurological: Negative.   Psychiatric/Behavioral: Negative.     Objective:    Physical Exam Vitals reviewed.  Constitutional:      General: He is not in acute distress.    Appearance: He is ill-appearing (thin  ). He is not toxic-appearing or diaphoretic.  HENT:     Head: Normocephalic and atraumatic.  Cardiovascular:     Rate and Rhythm: Normal rate and regular  rhythm.     Pulses: Normal pulses.     Heart sounds: Normal heart sounds. No murmur. No friction rub. No gallop.   Pulmonary:     Effort: Pulmonary effort is normal. No tachypnea, bradypnea, accessory muscle usage, prolonged expiration or respiratory distress.     Breath sounds: Normal air entry. No stridor, decreased air movement or transmitted upper airway sounds. Decreased breath sounds present.  Chest:     Chest wall: No mass or tenderness.  Abdominal:     Palpations: Abdomen is soft. There is no hepatomegaly, splenomegaly or pulsatile mass.     Tenderness: There is no abdominal tenderness.  Neurological:     Mental Status: He is alert.     Assessment & Plan:   Problem List Items Addressed This Visit      Cardiovascular and Mediastinum   Essential hypertension   Thoracic aortic aneurysm without rupture ([place])- NOTED ON CT 06/30/2018     Respiratory   Hepatocellular carcinoma metastatic to left lung ([facility])   Stage IV adenocarcinoma of lung, left ([place]) - Primary     Digestive   Chronic active hepatitis ([place])     Other   Liver mass   Screening for malignant neoplasm of prostate   Relevant Orders   PSA   Anemia   Relevant Orders   Fe+TIBC+Fer    Other Visit Diagnoses    Pulmonary emphysema, unspecified emphysema type ([place])   (Chronic)        Stage IV adenocarcinoma of lung, left ([place])  Hepatocellular carcinoma metastatic to left lung [facility])  Thoracic aortic aneurysm without rupture ([place])- NOTED ON CT 06/30/2018  Chronic active hepatitis ([facility])  Anemia, unspecified type - Plan: Fe+TIBC+Fer  Screening cholesterol level - Plan: Lipid Panel w/o Chol/HDL Ratio  Screening for malignant neoplasm of prostate - Plan: PSA  Essential hypertension  Liver mass  Pulmonary emphysema, unspecified emphysema type ([place]), Chronic    Outpatient Encounter Medications as of 07/12/2019  Medication Sig  . amLODipine (NORVASC) 5 MG tablet Take 1 tablet (5 mg total) by mouth daily.   Marland Kitchen dexamethasone (DECADRON) 4 MG tablet  Take 2 tablets (8 mg total) by mouth daily. Start the day after carboplatin chemotherapy for 3 days.  . fentaNYL (DURAGESIC) 25 MCG/HR Place 1 patch onto the skin every 3 (three) days.  Marland Kitchen lidocaine-prilocaine (EMLA) cream Apply to affected area once (Patient taking differently: Apply 1 application topically daily as needed (port acess). )  . loperamide (IMODIUM) 2 MG capsule Take 1 capsule (2 mg total) by mouth See admin instructions. With onset of loose stool, take 59m followed by 278mevery 2 hours until 12 hours have passed without loose bowel movement. Maximum: 16 mg/day  . Multiple Vitamin (MULTIVITAMIN) capsule Take 1 capsule by mouth daily.  . Marland KitchenARCAN 4 MG/0.1ML LIQD nasal spray kit 1 spray once.  . ondansetron (ZOFRAN) 8 MG tablet Take 1 tablet (8 mg total) by mouth 2 (two) times daily as needed for refractory nausea / vomiting. Start on day 3 after carboplatin chemo.  . Marland KitchenxyCODONE (ROXICODONE) 5 MG immediate release tablet Take 1 tablet (5 mg total) by mouth every 8 (eight) hours as needed for severe pain or breakthrough pain.  . polyethylene glycol (MIRALAX / GLYCOLAX) 17 g packet Take 17 g by mouth daily. (Patient not taking: Reported on 05/30/2019)  . potassium chloride SA (KLOR-CON M20) 20 MEQ tablet Take 1 tablet (20 mEq total) by mouth daily.  . prochlorperazine (COMPAZINE) 10 MG tablet Take 1 tablet (10 mg total) by mouth every 6 (six) hours as needed (Nausea or vomiting).  . Marland Kitchenenna (SENOKOT) 8.6 MG TABS tablet Take 1 tablet (8.6 mg total) by mouth daily.   No facility-administered encounter medications on file as of 07/12/2019.   Keep Palliative care and oncology follow up. Recheck of potassium next week at hematology/ oncology. Hypokalemia red flags discussed.   Declined ref feral to GI for active hepatitis  C. He reports he wants to do his chemotherapy and he declined any further work up for hepatitis.  Appetite discussed, will try and eat  more frequent meals, including some carbohydrate with each meal, such as bread, apple sauce or toast. Try ensure.     Addressed chronic medical problems today requiring 40 minutes reviewing patients medical record,labs, counseling patient regarding patient's conditions, any medications, answering questions regarding health, and coordination of care as needed. After visit summary patient given copy and reviewed.   Keep palliative care video visit.   Needs annual wellness.  Follow-up: Return today (on 07/12/2019), or if symptoms worsen or fail to improve, for at any time for any worsening symptoms.  For appetite   Advised patient call the office or your primary care doctor for an appointment if no improvement within 72 hours or if any symptoms change or worsen at any time  Advised ER or urgent Care if after hours or on weekend. Call 911 for emergency symptoms at any time.Patinet verbalized understanding of all instructions given/reviewed and treatment plan and has no further questions or concerns at this time.    Addressed extensive list of chronic and acute medical problems today requiring 39  minutes reviewing his medical record, counseling patient regarding his conditions and coordination of care.    MiMarcille BuffyFNP

## 2019-07-18 ENCOUNTER — Inpatient Hospital Stay: Payer: Medicare Other | Attending: Oncology

## 2019-07-18 ENCOUNTER — Inpatient Hospital Stay: Payer: Medicare Other

## 2019-07-18 ENCOUNTER — Encounter: Payer: Self-pay | Admitting: Oncology

## 2019-07-18 ENCOUNTER — Inpatient Hospital Stay (HOSPITAL_BASED_OUTPATIENT_CLINIC_OR_DEPARTMENT_OTHER): Payer: Medicare Other | Admitting: Oncology

## 2019-07-18 ENCOUNTER — Other Ambulatory Visit: Payer: Self-pay

## 2019-07-18 VITALS — BP 104/80 | HR 107 | Temp 96.0°F | Wt 134.9 lb

## 2019-07-18 DIAGNOSIS — J948 Other specified pleural conditions: Secondary | ICD-10-CM | POA: Insufficient documentation

## 2019-07-18 DIAGNOSIS — I700 Atherosclerosis of aorta: Secondary | ICD-10-CM | POA: Diagnosis not present

## 2019-07-18 DIAGNOSIS — R000 Tachycardia, unspecified: Secondary | ICD-10-CM | POA: Insufficient documentation

## 2019-07-18 DIAGNOSIS — Z7289 Other problems related to lifestyle: Secondary | ICD-10-CM | POA: Insufficient documentation

## 2019-07-18 DIAGNOSIS — G893 Neoplasm related pain (acute) (chronic): Secondary | ICD-10-CM | POA: Diagnosis not present

## 2019-07-18 DIAGNOSIS — T451X5A Adverse effect of antineoplastic and immunosuppressive drugs, initial encounter: Secondary | ICD-10-CM | POA: Diagnosis not present

## 2019-07-18 DIAGNOSIS — C7951 Secondary malignant neoplasm of bone: Secondary | ICD-10-CM

## 2019-07-18 DIAGNOSIS — R140 Abdominal distension (gaseous): Secondary | ICD-10-CM | POA: Insufficient documentation

## 2019-07-18 DIAGNOSIS — Z79899 Other long term (current) drug therapy: Secondary | ICD-10-CM | POA: Insufficient documentation

## 2019-07-18 DIAGNOSIS — I491 Atrial premature depolarization: Secondary | ICD-10-CM | POA: Insufficient documentation

## 2019-07-18 DIAGNOSIS — M79642 Pain in left hand: Secondary | ICD-10-CM | POA: Insufficient documentation

## 2019-07-18 DIAGNOSIS — M25512 Pain in left shoulder: Secondary | ICD-10-CM | POA: Diagnosis not present

## 2019-07-18 DIAGNOSIS — M79604 Pain in right leg: Secondary | ICD-10-CM | POA: Insufficient documentation

## 2019-07-18 DIAGNOSIS — Z5111 Encounter for antineoplastic chemotherapy: Secondary | ICD-10-CM

## 2019-07-18 DIAGNOSIS — R2689 Other abnormalities of gait and mobility: Secondary | ICD-10-CM | POA: Insufficient documentation

## 2019-07-18 DIAGNOSIS — R0789 Other chest pain: Secondary | ICD-10-CM | POA: Diagnosis not present

## 2019-07-18 DIAGNOSIS — I10 Essential (primary) hypertension: Secondary | ICD-10-CM | POA: Diagnosis not present

## 2019-07-18 DIAGNOSIS — B1920 Unspecified viral hepatitis C without hepatic coma: Secondary | ICD-10-CM | POA: Diagnosis not present

## 2019-07-18 DIAGNOSIS — R600 Localized edema: Secondary | ICD-10-CM | POA: Insufficient documentation

## 2019-07-18 DIAGNOSIS — I712 Thoracic aortic aneurysm, without rupture: Secondary | ICD-10-CM | POA: Diagnosis not present

## 2019-07-18 DIAGNOSIS — M79641 Pain in right hand: Secondary | ICD-10-CM | POA: Diagnosis not present

## 2019-07-18 DIAGNOSIS — E876 Hypokalemia: Secondary | ICD-10-CM | POA: Insufficient documentation

## 2019-07-18 DIAGNOSIS — K8020 Calculus of gallbladder without cholecystitis without obstruction: Secondary | ICD-10-CM | POA: Insufficient documentation

## 2019-07-18 DIAGNOSIS — C7802 Secondary malignant neoplasm of left lung: Secondary | ICD-10-CM

## 2019-07-18 DIAGNOSIS — Z95828 Presence of other vascular implants and grafts: Secondary | ICD-10-CM

## 2019-07-18 DIAGNOSIS — C220 Liver cell carcinoma: Secondary | ICD-10-CM

## 2019-07-18 DIAGNOSIS — C3492 Malignant neoplasm of unspecified part of left bronchus or lung: Secondary | ICD-10-CM

## 2019-07-18 DIAGNOSIS — Z125 Encounter for screening for malignant neoplasm of prostate: Secondary | ICD-10-CM

## 2019-07-18 DIAGNOSIS — Z87891 Personal history of nicotine dependence: Secondary | ICD-10-CM | POA: Insufficient documentation

## 2019-07-18 DIAGNOSIS — G620 Drug-induced polyneuropathy: Secondary | ICD-10-CM | POA: Diagnosis not present

## 2019-07-18 DIAGNOSIS — Z5112 Encounter for antineoplastic immunotherapy: Secondary | ICD-10-CM | POA: Insufficient documentation

## 2019-07-18 DIAGNOSIS — K769 Liver disease, unspecified: Secondary | ICD-10-CM | POA: Insufficient documentation

## 2019-07-18 DIAGNOSIS — R634 Abnormal weight loss: Secondary | ICD-10-CM | POA: Insufficient documentation

## 2019-07-18 DIAGNOSIS — M79605 Pain in left leg: Secondary | ICD-10-CM | POA: Insufficient documentation

## 2019-07-18 DIAGNOSIS — N281 Cyst of kidney, acquired: Secondary | ICD-10-CM | POA: Diagnosis not present

## 2019-07-18 DIAGNOSIS — K5900 Constipation, unspecified: Secondary | ICD-10-CM | POA: Diagnosis not present

## 2019-07-18 DIAGNOSIS — Z809 Family history of malignant neoplasm, unspecified: Secondary | ICD-10-CM | POA: Insufficient documentation

## 2019-07-18 LAB — CBC WITH DIFFERENTIAL/PLATELET
Abs Immature Granulocytes: 0.05 10*3/uL (ref 0.00–0.07)
Basophils Absolute: 0 10*3/uL (ref 0.0–0.1)
Basophils Relative: 0 %
Eosinophils Absolute: 0.2 10*3/uL (ref 0.0–0.5)
Eosinophils Relative: 2 %
HCT: 31.6 % — ABNORMAL LOW (ref 39.0–52.0)
Hemoglobin: 10.6 g/dL — ABNORMAL LOW (ref 13.0–17.0)
Immature Granulocytes: 1 %
Lymphocytes Relative: 14 %
Lymphs Abs: 1 10*3/uL (ref 0.7–4.0)
MCH: 29.3 pg (ref 26.0–34.0)
MCHC: 33.5 g/dL (ref 30.0–36.0)
MCV: 87.3 fL (ref 80.0–100.0)
Monocytes Absolute: 0.8 10*3/uL (ref 0.1–1.0)
Monocytes Relative: 11 %
Neutro Abs: 5.2 10*3/uL (ref 1.7–7.7)
Neutrophils Relative %: 72 %
Platelets: 380 10*3/uL (ref 150–400)
RBC: 3.62 MIL/uL — ABNORMAL LOW (ref 4.22–5.81)
RDW: 17.2 % — ABNORMAL HIGH (ref 11.5–15.5)
WBC: 7.2 10*3/uL (ref 4.0–10.5)
nRBC: 0 % (ref 0.0–0.2)

## 2019-07-18 LAB — COMPREHENSIVE METABOLIC PANEL
ALT: 22 U/L (ref 0–44)
AST: 31 U/L (ref 15–41)
Albumin: 2.9 g/dL — ABNORMAL LOW (ref 3.5–5.0)
Alkaline Phosphatase: 65 U/L (ref 38–126)
Anion gap: 9 (ref 5–15)
BUN: 5 mg/dL — ABNORMAL LOW (ref 8–23)
CO2: 27 mmol/L (ref 22–32)
Calcium: 8.8 mg/dL — ABNORMAL LOW (ref 8.9–10.3)
Chloride: 103 mmol/L (ref 98–111)
Creatinine, Ser: 0.52 mg/dL — ABNORMAL LOW (ref 0.61–1.24)
GFR calc Af Amer: >60 mL/min (ref >60)
GFR calc non Af Amer: >60 mL/min (ref >60)
Glucose, Bld: 136 mg/dL — ABNORMAL HIGH (ref 70–99)
Potassium: 3.6 mmol/L (ref 3.5–5.1)
Sodium: 139 mmol/L (ref 135–145)
Total Bilirubin: 0.7 mg/dL (ref 0.3–1.2)
Total Protein: 7.1 g/dL (ref 6.5–8.1)

## 2019-07-18 LAB — PROTEIN, URINE, RANDOM: Total Protein, Urine: 14 mg/dL

## 2019-07-18 MED ORDER — SODIUM CHLORIDE 0.9 % IV SOLN
15.0000 mg/kg | Freq: Once | INTRAVENOUS | Status: AC
  Administered 2019-07-18: 1000 mg via INTRAVENOUS
  Filled 2019-07-18: qty 32

## 2019-07-18 MED ORDER — MEGESTROL ACETATE 40 MG PO TABS
40.0000 mg | ORAL_TABLET | Freq: Two times a day (BID) | ORAL | 0 refills | Status: DC
Start: 2019-07-18 — End: 2019-10-17

## 2019-07-18 MED ORDER — PALONOSETRON HCL INJECTION 0.25 MG/5ML
0.2500 mg | Freq: Once | INTRAVENOUS | Status: AC
  Administered 2019-07-18: 0.25 mg via INTRAVENOUS
  Filled 2019-07-18: qty 5

## 2019-07-18 MED ORDER — FAMOTIDINE IN NACL 20-0.9 MG/50ML-% IV SOLN
20.0000 mg | Freq: Once | INTRAVENOUS | Status: AC
  Administered 2019-07-18: 20 mg via INTRAVENOUS
  Filled 2019-07-18: qty 50

## 2019-07-18 MED ORDER — DIPHENHYDRAMINE HCL 50 MG/ML IJ SOLN
50.0000 mg | Freq: Once | INTRAMUSCULAR | Status: AC
  Administered 2019-07-18: 50 mg via INTRAVENOUS
  Filled 2019-07-18: qty 1

## 2019-07-18 MED ORDER — SODIUM CHLORIDE 0.9 % IV SOLN
418.9500 mg | Freq: Once | INTRAVENOUS | Status: AC
  Administered 2019-07-18: 420 mg via INTRAVENOUS
  Filled 2019-07-18: qty 42

## 2019-07-18 MED ORDER — SODIUM CHLORIDE 0.9 % IV SOLN
INTRAVENOUS | Status: DC
  Filled 2019-07-18: qty 250

## 2019-07-18 MED ORDER — SODIUM CHLORIDE 0.9 % IV SOLN
10.0000 mg | Freq: Once | INTRAVENOUS | Status: AC
  Administered 2019-07-18: 10 mg via INTRAVENOUS
  Filled 2019-07-18: qty 10

## 2019-07-18 MED ORDER — SODIUM CHLORIDE 0.9 % IV SOLN
175.0000 mg/m2 | Freq: Once | INTRAVENOUS | Status: AC
  Administered 2019-07-18: 306 mg via INTRAVENOUS
  Filled 2019-07-18: qty 51

## 2019-07-18 MED ORDER — HEPARIN SOD (PORK) LOCK FLUSH 100 UNIT/ML IV SOLN
INTRAVENOUS | Status: AC
  Filled 2019-07-18: qty 5

## 2019-07-18 MED ORDER — SODIUM CHLORIDE 0.9 % IV SOLN
Freq: Once | INTRAVENOUS | Status: AC
  Filled 2019-07-18: qty 250

## 2019-07-18 MED ORDER — SODIUM CHLORIDE 0.9 % IV SOLN
150.0000 mg | Freq: Once | INTRAVENOUS | Status: AC
  Administered 2019-07-18: 150 mg via INTRAVENOUS
  Filled 2019-07-18: qty 150

## 2019-07-18 MED ORDER — HEPARIN SOD (PORK) LOCK FLUSH 100 UNIT/ML IV SOLN
500.0000 [IU] | Freq: Once | INTRAVENOUS | Status: AC | PRN
  Administered 2019-07-18: 500 [IU]
  Filled 2019-07-18: qty 5

## 2019-07-18 MED ORDER — SODIUM CHLORIDE 0.9 % IV SOLN
1200.0000 mg | Freq: Once | INTRAVENOUS | Status: AC
  Administered 2019-07-18: 1200 mg via INTRAVENOUS
  Filled 2019-07-18: qty 20

## 2019-07-18 MED ORDER — SODIUM CHLORIDE 0.9% FLUSH
10.0000 mL | INTRAVENOUS | Status: DC | PRN
  Administered 2019-07-18: 10 mL via INTRAVENOUS
  Filled 2019-07-18: qty 10

## 2019-07-18 NOTE — Progress Notes (Signed)
Hematology/Oncology follow up note [facility] Telephone:([phone]) [phone] [phone]   Patient Care Team: Jerrol Banana., MD as PCP - General (Family Medicine) Telford Nab, RN as Registered Nurse Clent Jacks, RN as Registered Nurse  REFERRING PROVIDER: Jerrol Banana.,*  CHIEF COMPLAINTS/REASON FOR VISIT:  metastatic cancer management.  HISTORY OF PRESENTING ILLNESS:   Samuel Hood. is a  69 y.o.  male with PMH listed below was seen in consultation at the request of  Jerrol Banana.,*  for evaluation of lung nodule in the liver lesion. Patient has a past medical history of hypertension, alcohol abuse, smoking emphysema.  He has had serial CTs done in the past.  CT images were independently reviewed by me. 07/26/2017 CT chest with contrast showed extensive pleural-parenchymal scarring within the left upper lobe with several areas of soft tissue nodularity measuring up to 1.6 cm.  In this patient who is at increased risk of lung cancer further investigation with PET scan is recommended. Small chronic appearance loculated hydropneumothorax overlies the left apex. Slowly enlarging lesion within the central portion of the right lobe of liver identified. Per note, patient supposed to have additional work-up done in December repeat CT scan which he did not  Patient was recently seen by primary care provider and has subsequent image done for follow-up. CT on 06/30/2018 showed multiple significantly enlarged paraspinal mass are noted concerning.  The largest measures 3.8 x 0.5 cm and there appears to be a lytic destruction of the adjacent T11 vertebral body.  Also noted is new solid density measuring 17 x 12 mm in the pleural parenchymal density in the left upper lobe noted on prior exam.  Concerning for malignancy.  4.4 ascending thoracic aortic aneurysm.  Recommend annual imaging.  Patient had PET scan done on 07/06/2018 Which showed  there are 2 FDG avid lesion within the left upper lobe worrisome for primary lung neoplasm.  There is evidence of chest wall involvement.  Metastasis to the posterior mediastinum is identified with extension into T11 vertebra and possible involvement of the left T12 neural foramina.  Patient reports left shoulder pain.  Smoking half a pack a day not motivated with further questioning quitting Denies weight loss, hemoptysis, cough.  Chronic shortness of breath with exertion. He drinks alcohol daily. Lives with his girlfriend.  He has 5 adult kids  #Hepatitis C  # 6/1?2020  CT-guided biopsy of left-sided posterior mediastinal soft tissue adjacent to T10/11. Patient's case was discussed on tumor board. Consensus was reached that [facility] versus primary lung cancer with hepatoid adenocarcinoma features. Consensus was to proceed with liver biopsy first as liver mass was not FDG avid on PET scan.  Patient underwent liver biopsy and the present to discuss pathology reports.  ## 08/08/2018 Liver mass biopsy showed fragments of necrotic and calcified tissue with adjacent fibrous capsule Scant viable nonneoplastic liver tissue with steatosis, inflammation and non specific fibrosis.  # 08/28/2018 CT guided left upper lobe lung mass biopsy showed poorly differentiated adenocarcinoma of lung origin.  6/19-08/16/2018  Finished palliative radiation to paraspinal soft tissue mass. 09/07/2018 patient was started on lenvatinib 12 mg daily. 10/13/2018 SBRT to lung cancer lesion.  #Patient is on Zometa monthly #He was advised to take calcium supplement.  He reports he quit taking calcium as he broke out rash on her scalp, neck and chest after taking calcium. # 03/30/19 liver lesion biopsy showed poorly differentiated adenocarcinoma, compatible with lung primary.  INTERVAL  HISTORY Samuel Hood. is a 69 y.o. male who has above history reviewed by me today presents for follow up visit for assessment of prior to  chemotherapy for the treatment of [place] and lung cancer. Patient reports feeling tired. No appetite  Denies any loose stool, cough, shortness of breath, fever or chills.   Pain is well controlled with current pain regimen  He reports feeling tired and fatigue.  He has lost 1 pound since last visit. His blood pressure was 104/80, heart rate 107. He takes amlodipine 5 mg daily.  He denies any lightheadedness today.  Review of Systems  Constitutional: Positive for appetite change and fatigue. Negative for chills, fever and unexpected weight change.  HENT:   Negative for hearing loss and voice change.   Eyes: Negative for eye problems and icterus.  Respiratory: Negative for chest tightness, cough and shortness of breath.   Cardiovascular: Negative for chest pain and leg swelling.  Gastrointestinal: Negative for abdominal distention, abdominal pain and constipation.  Endocrine: Negative for hot flashes.  Genitourinary: Negative for difficulty urinating, dysuria and frequency.   Musculoskeletal: Negative for arthralgias.  Skin: Negative for itching and rash.  Neurological: Negative for light-headedness and numbness.  Hematological: Negative for adenopathy. Does not bruise/bleed easily.  Psychiatric/Behavioral: Negative for confusion.    MEDICAL HISTORY:  Past Medical History:  Diagnosis Date  . Hepatocellular carcinoma metastatic to left lung ([place]) 07/24/2018  . History of angiography    left lower extremity  . Hypertension   . Small bowel obstruction ([place])     SURGICAL HISTORY: Past Surgical History:  Procedure Laterality Date  . COLON SURGERY    . COLONOSCOPY W/ POLYPECTOMY    . COLONOSCOPY WITH PROPOFOL N/A 01/31/2018   Procedure: COLONOSCOPY WITH PROPOFOL;  Surgeon: Toledo, Benay Pike, MD;  Location: [facility] ENDOSCOPY;  Service: Gastroenterology;  Laterality: N/A;  . debridement fasciotomy leg, left Left 03/19/2016  . LAPAROTOMY N/A 09/27/2014   Procedure: EXPLORATORY LAPAROTOMY;   Surgeon: Dia Crawford III, MD;  Location: [facility] ORS;  Service: General;  Laterality: N/A;  . PORTA CATH INSERTION N/A 04/10/2019   Procedure: PORTA CATH INSERTION;  Surgeon: Katha Cabal, MD;  Location: [place] [facility];  Service: Cardiovascular;  Laterality: N/A;    SOCIAL HISTORY: Social History   Socioeconomic History  . Marital status: Single    Spouse name: Not on file  . Number of children: Not on file  . Years of education: Not on file  . Highest education level: Not on file  Occupational History  . Occupation: retired  Tobacco Use  . Smoking status: Former Smoker    Packs/day: 0.25    Years: 50.00    Pack years: 12.50  . Smokeless tobacco: Never Used  . Tobacco comment: 1-2/week  Substance and Sexual Activity  . Alcohol use: Not Currently    Alcohol/week: 35.0 standard drinks    Types: 30 Cans of beer, 5 Shots of liquor per week  . Drug use: No  . Sexual activity: Not on file  Other Topics Concern  . Not on file  Social History Narrative  . Not on file   Social Determinants of Health   Financial Resource Strain:   . Difficulty of Paying Living Expenses:   Food Insecurity:   . Worried About Charity fundraiser in the Last Year:   . Arboriculturist in the Last Year:   Transportation Needs:   . Film/video editor (Medical):   Marland Kitchen  Lack of Transportation (Non-Medical):   Physical Activity:   . Days of Exercise per Week:   . Minutes of Exercise per Session:   Stress:   . Feeling of Stress :   Social Connections:   . Frequency of Communication with Friends and Family:   . Frequency of Social Gatherings with Friends and Family:   . Attends Religious Services:   . Active Member of Clubs or Organizations:   . Attends Archivist Meetings:   Marland Kitchen Marital Status:   Intimate Partner Violence:   . Fear of Current or Ex-Partner:   . Emotionally Abused:   Marland Kitchen Physically Abused:   . Sexually Abused:     FAMILY HISTORY: Family History  Problem  Relation Age of Onset  . Cancer Mother   . Cancer Father     ALLERGIES:  is allergic to 5-alpha reductase inhibitors.  MEDICATIONS:  Current Outpatient Medications  Medication Sig Dispense Refill  . amLODipine (NORVASC) 5 MG tablet Take 1 tablet (5 mg total) by mouth daily. 30 tablet 0  . dexamethasone (DECADRON) 4 MG tablet Take 2 tablets (8 mg total) by mouth daily. Start the day after carboplatin chemotherapy for 3 days. 30 tablet 1  . fentaNYL (DURAGESIC) 25 MCG/HR Place 1 patch onto the skin every 3 (three) days. 10 patch 0  . lidocaine-prilocaine (EMLA) cream Apply to affected area once (Patient taking differently: Apply 1 application topically daily as needed (port acess). ) 30 g 3  . loperamide (IMODIUM) 2 MG capsule Take 1 capsule (2 mg total) by mouth See admin instructions. With onset of loose stool, take 86m followed by 213mevery 2 hours until 12 hours have passed without loose bowel movement. Maximum: 16 mg/day 60 capsule 1  . Multiple Vitamin (MULTIVITAMIN) capsule Take 1 capsule by mouth daily.    . Marland KitchenARCAN 4 MG/0.1ML LIQD nasal spray kit 1 spray once.    . ondansetron (ZOFRAN) 8 MG tablet Take 1 tablet (8 mg total) by mouth 2 (two) times daily as needed for refractory nausea / vomiting. Start on day 3 after carboplatin chemo. 30 tablet 1  . oxyCODONE (ROXICODONE) 5 MG immediate release tablet Take 1 tablet (5 mg total) by mouth every 8 (eight) hours as needed for severe pain or breakthrough pain. 60 tablet 0  . polyethylene glycol (MIRALAX / GLYCOLAX) 17 g packet Take 17 g by mouth daily. (Patient not taking: Reported on 05/30/2019) 14 each 0  . potassium chloride SA (KLOR-CON M20) 20 MEQ tablet Take 1 tablet (20 mEq total) by mouth daily. 30 tablet 0  . prochlorperazine (COMPAZINE) 10 MG tablet Take 1 tablet (10 mg total) by mouth every 6 (six) hours as needed (Nausea or vomiting). 30 tablet 1  . senna (SENOKOT) 8.6 MG TABS tablet Take 1 tablet (8.6 mg total) by mouth daily. 120  tablet 0   No current facility-administered medications for this visit.   Facility-Administered Medications Ordered in Other Visits  Medication Dose Route Frequency Provider Last Rate Last Admin  . sodium chloride flush (NS) 0.9 % injection 10 mL  10 mL Intravenous PRN YuEarlie ServerMD   10 mL at 07/18/19 0806     PHYSICAL EXAMINATION: ECOG PERFORMANCE STATUS: 1 - Symptomatic but completely ambulatory Vitals:   07/18/19 0842  BP: 104/80  Pulse: (!) 107  Temp: (!) 96 F (35.6 C)  SpO2: 100%   Filed Weights   07/18/19 0842  Weight: 134 lb 14.4 oz (61.2 kg)  Physical Exam Constitutional:      General: He is not in acute distress. HENT:     Head: Normocephalic and atraumatic.  Eyes:     General: No scleral icterus. Cardiovascular:     Rate and Rhythm: Regular rhythm. Tachycardia present.     Heart sounds: Normal heart sounds.  Pulmonary:     Effort: Pulmonary effort is normal. No respiratory distress.     Breath sounds: No wheezing.  Abdominal:     General: Bowel sounds are normal. There is no distension.     Palpations: Abdomen is soft.  Musculoskeletal:        General: No deformity. Normal range of motion.     Cervical back: Normal range of motion and neck supple.  Skin:    General: Skin is warm and dry.     Findings: No erythema or rash.  Neurological:     Mental Status: He is alert and oriented to person, place, and time. Mental status is at baseline.     Cranial Nerves: No cranial nerve deficit.     Coordination: Coordination normal.  Psychiatric:        Mood and Affect: Mood normal.     LABORATORY DATA:  I have reviewed the data as listed Lab Results  Component Value Date   WBC 7.6 07/11/2019   HGB 11.3 (L) 07/11/2019   HCT 33.6 (L) 07/11/2019   MCV 85.9 07/11/2019   PLT 315 07/11/2019   Recent Labs    05/30/19 0824 05/30/19 0824 06/06/19 1051 06/20/19 0754 07/11/19 0801  NA 140   < > 137 139 137  K 2.9*   < > 3.5 3.9 2.7*  CL 103   < > 101  105 102  CO2 27   < > _0 GLUCOSE 111*   < > 100* 98 125*  BUN 7*   < > 13 7* 6*  CREATININE 0.59*   < > 0.61 0.67 0.57*  CALCIUM 8.9   < > 9.0 8.8* 8.5*  GFRNONAA >60   < > >60 >60 >60  GFRAA >60   < > >60 >60 >60  PROT 7.4  --   --  6.7 7.1  ALBUMIN 3.2*  --   --  3.3* 3.0*  AST 34  --   --  42* 32  ALT 33  --   --  42 27  ALKPHOS 87  --   --  82 64  BILITOT 0.5  --   --  0.5 0.8   < > = values in this interval not displayed.   Iron/TIBC/Ferritin/ %Sat No results found for: IRON, TIBC, FERRITIN, IRONPCTSAT   RADIOGRAPHIC STUDIES: I have personally reviewed the radiological images as listed and agreed with the findings in the report. CT Chest W Contrast  Result Date: 07/04/2019 CLINICAL DATA:  Stage IV adenocarcinoma of the lung. EXAM: CT CHEST, ABDOMEN, AND PELVIS WITH CONTRAST TECHNIQUE: Multidetector CT imaging of the chest, abdomen and pelvis was performed following the standard protocol during bolus administration of intravenous contrast. CONTRAST:  31m OMNIPAQUE IOHEXOL 300 MG/ML  SOLN COMPARISON:  03/26/2019 FINDINGS: CT CHEST FINDINGS Cardiovascular: The heart size is normal. No substantial pericardial effusion. Coronary artery calcification is evident. Atherosclerotic calcification is noted in the wall of the thoracic aorta. Ascending thoracic aorta measures 4.1 cm diameter. Right-sided Port-A-Cath is looped in the right jugular vein with the tip directed centrally at the innominate vein confluence. Mediastinum/Nodes: No mediastinal lymphadenopathy. There is no  hilar lymphadenopathy. There is no axillary lymphadenopathy. The esophagus has normal imaging features. Lungs/Pleura: Centrilobular and paraseptal emphysema evident. Bullous change noted in the apices, left greater than right. Architectural distortion with patchy ill-defined consolidative opacity in the anterior left upper lobe is similar to prior and compatible with sequelae of prior therapy. No new suspicious  pulmonary nodule or mass. No pleural effusion. Musculoskeletal: Lucent lesion in the T11 vertebral body is stable. Small focus of left paraspinal soft tissue at T11 is unchanged. CT ABDOMEN PELVIS FINDINGS Hepatobiliary: Central liver mass (segment VIII) is stable at 2.9 x 2.8 cm today compared to 3.1 x 2.9 cm previously. Smaller immediately adjacent lesion measures 1.3 x 1.4 cm today (59/2) compared to 1.5 x 1.5 cm previously. Ill-defined hypoenhancing lesion posterior right liver (66/2) measures 1.5 x 1.5 cm today compared to 2.2 x 2.4 cm previously. A very subtle 7 mm hypoattenuating lesion in the posterior right liver identified on the previous study is not discernible on today's exam. Calcified gallstones again noted. No intrahepatic or extrahepatic biliary dilation. Pancreas: No focal mass lesion. No dilatation of the main duct. No intraparenchymal cyst. No peripancreatic edema. Spleen: No splenomegaly. No focal mass lesion. Adrenals/Urinary Tract: No adrenal nodule or mass. Small right renal cysts are stable. Left kidney unremarkable. No evidence for hydroureter. The urinary bladder appears normal for the degree of distention. Stomach/Bowel: Stomach is unremarkable. No gastric wall thickening. No evidence of outlet obstruction. Duodenum is normally positioned as is the ligament of Treitz. No small bowel wall thickening. No small bowel dilatation. The terminal ileum is normal. The appendix is not visualized, but there is no edema or inflammation in the region of the cecum. Left colon anastomosis again noted. Vascular/Lymphatic: There is abdominal aortic atherosclerosis without aneurysm. There is no gastrohepatic or hepatoduodenal ligament lymphadenopathy. No retroperitoneal or mesenteric lymphadenopathy. No pelvic sidewall lymphadenopathy. Reproductive: The prostate gland and seminal vesicles are unremarkable. Other: No intraperitoneal free fluid. Musculoskeletal: No worrisome lytic or sclerotic osseous  abnormality. IMPRESSION: 1. No new or progressive findings in the chest, abdomen, or pelvis. 2. Hepatic metastases have decreased in the interval and a very subtle 7 mm hypoattenuating lesion in the posterior right liver identified on the previous study is not discernible on today's exam. 3. Stable appearance of the lucent lesion in the T11 vertebral body with no change in the small focus of adjacent left paraspinal soft tissue attenuation. 4. The right-sided Port-A-Cath is looped in the right internal jugular vein with the tip directed centrally at the level of the innominate vein confluence. 5. Cholelithiasis. 6. Ascending thoracic aortic aneurysm at 4.1 cm diameter. Continued attention on follow-up recommended. 7. Aortic Atherosclerosis (ICD10-I70.0) and Emphysema (ICD10-J43.9). Electronically Signed   By: Misty Stanley M.D.   On: 07/04/2019 09:07   CT Abdomen Pelvis W Contrast  Result Date: 07/04/2019 CLINICAL DATA:  Stage IV adenocarcinoma of the lung. EXAM: CT CHEST, ABDOMEN, AND PELVIS WITH CONTRAST TECHNIQUE: Multidetector CT imaging of the chest, abdomen and pelvis was performed following the standard protocol during bolus administration of intravenous contrast. CONTRAST:  68m OMNIPAQUE IOHEXOL 300 MG/ML  SOLN COMPARISON:  03/26/2019 FINDINGS: CT CHEST FINDINGS Cardiovascular: The heart size is normal. No substantial pericardial effusion. Coronary artery calcification is evident. Atherosclerotic calcification is noted in the wall of the thoracic aorta. Ascending thoracic aorta measures 4.1 cm diameter. Right-sided Port-A-Cath is looped in the right jugular vein with the tip directed centrally at the innominate vein confluence. Mediastinum/Nodes: No mediastinal lymphadenopathy. There is  no hilar lymphadenopathy. There is no axillary lymphadenopathy. The esophagus has normal imaging features. Lungs/Pleura: Centrilobular and paraseptal emphysema evident. Bullous change noted in the apices, left greater  than right. Architectural distortion with patchy ill-defined consolidative opacity in the anterior left upper lobe is similar to prior and compatible with sequelae of prior therapy. No new suspicious pulmonary nodule or mass. No pleural effusion. Musculoskeletal: Lucent lesion in the T11 vertebral body is stable. Small focus of left paraspinal soft tissue at T11 is unchanged. CT ABDOMEN PELVIS FINDINGS Hepatobiliary: Central liver mass (segment VIII) is stable at 2.9 x 2.8 cm today compared to 3.1 x 2.9 cm previously. Smaller immediately adjacent lesion measures 1.3 x 1.4 cm today (59/2) compared to 1.5 x 1.5 cm previously. Ill-defined hypoenhancing lesion posterior right liver (66/2) measures 1.5 x 1.5 cm today compared to 2.2 x 2.4 cm previously. A very subtle 7 mm hypoattenuating lesion in the posterior right liver identified on the previous study is not discernible on today's exam. Calcified gallstones again noted. No intrahepatic or extrahepatic biliary dilation. Pancreas: No focal mass lesion. No dilatation of the main duct. No intraparenchymal cyst. No peripancreatic edema. Spleen: No splenomegaly. No focal mass lesion. Adrenals/Urinary Tract: No adrenal nodule or mass. Small right renal cysts are stable. Left kidney unremarkable. No evidence for hydroureter. The urinary bladder appears normal for the degree of distention. Stomach/Bowel: Stomach is unremarkable. No gastric wall thickening. No evidence of outlet obstruction. Duodenum is normally positioned as is the ligament of Treitz. No small bowel wall thickening. No small bowel dilatation. The terminal ileum is normal. The appendix is not visualized, but there is no edema or inflammation in the region of the cecum. Left colon anastomosis again noted. Vascular/Lymphatic: There is abdominal aortic atherosclerosis without aneurysm. There is no gastrohepatic or hepatoduodenal ligament lymphadenopathy. No retroperitoneal or mesenteric lymphadenopathy. No pelvic  sidewall lymphadenopathy. Reproductive: The prostate gland and seminal vesicles are unremarkable. Other: No intraperitoneal free fluid. Musculoskeletal: No worrisome lytic or sclerotic osseous abnormality. IMPRESSION: 1. No new or progressive findings in the chest, abdomen, or pelvis. 2. Hepatic metastases have decreased in the interval and a very subtle 7 mm hypoattenuating lesion in the posterior right liver identified on the previous study is not discernible on today's exam. 3. Stable appearance of the lucent lesion in the T11 vertebral body with no change in the small focus of adjacent left paraspinal soft tissue attenuation. 4. The right-sided Port-A-Cath is looped in the right internal jugular vein with the tip directed centrally at the level of the innominate vein confluence. 5. Cholelithiasis. 6. Ascending thoracic aortic aneurysm at 4.1 cm diameter. Continued attention on follow-up recommended. 7. Aortic Atherosclerosis (ICD10-I70.0) and Emphysema (ICD10-J43.9). Electronically Signed   By: Misty Stanley M.D.   On: 07/04/2019 09:07   DG Abdomen Acute W/Chest  Result Date: 05/14/2019 CLINICAL DATA:  Abdominal distension and constipation EXAM: DG ABDOMEN ACUTE W/ 1V CHEST COMPARISON:  03/26/2019 CT FINDINGS: Normal heart size and stable mediastinal contours. Post treatment scarring in the left upper lobe. There is no edema, consolidation, effusion, or pneumothorax. Porta catheter with tip at the upper SVC. Moderate stool volume seen along the flanks. No worrisome bowel dilatation. No concerning mass effect or gas collection. Lumbar spine degeneration IMPRESSION: Nonobstructive bowel gas pattern with moderate stool volume. Electronically Signed   By: Monte Fantasia M.D.   On: 05/14/2019 07:16      ASSESSMENT & PLAN:  1. Hepatocellular carcinoma metastatic to left lung ([place])  2. Primary lung adenocarcinoma, left ([place])   3. Encounter for antineoplastic chemotherapy   4. Hypokalemia   5. Neoplasm  related pain    #Stage IV Lung adenocarcinoma, Stage IV HCC-07/17/2018 paraspinal soft tissue biopsy Patient is on palliative chemotherapy with carboplatin, Taxol, bevacizumab, Tecentriq. Interim CT was independently reviewed by me and discussed with patient. No new or progressive findings. Hepatic metastases have decreased during the interval. Stable appearance on the lucent lesion in the T11 vertebral body. Labs are reviewed and discussed with patient. Proceed with today's carboplatin, Taxol, bevacizumab, Tecentriq.  Dose reduce carboplatin and Taxol given patient's performance status.   #Severe hypokalemia, potassium level is normal today.  Continue maintenance potassium 61mq daily.   #Weight loss, likely due to decreased oral intake. Patient will receive a liter of normal saline today. I recommend patient to start trial of Megace 40 mg twice daily.  Prescription was sent to pharmacy.  #Neoplasm related pain, continue fentanyl patch and oxycodone. #Hypertension, today's blood pressure is borderline.  Tachycardic. Recommend patient to stop amlodipine.  Measure his blood pressure at home. Resume amlodipine if systolic blood pressure is above 140.  #Continue follow-up with palliative care service. Patient to follow-up in 1 week with reevaluation  . We spent sufficient time to discuss many aspect of care, questions were answered to patient's satisfaction. All questions were answered. The patient knows to call the clinic with any problems questions or concerns.   ZEarlie Server MD, PhD 07/18/2019

## 2019-07-18 NOTE — Progress Notes (Signed)
Patient here for follow up. Reports feeling weak, cold and has no appetite.

## 2019-07-19 LAB — AFP TUMOR MARKER: AFP, Serum, Tumor Marker: 11.7 ng/mL — ABNORMAL HIGH (ref 0.0–8.3)

## 2019-07-24 ENCOUNTER — Inpatient Hospital Stay (HOSPITAL_BASED_OUTPATIENT_CLINIC_OR_DEPARTMENT_OTHER): Payer: Medicare Other | Admitting: Oncology

## 2019-07-24 ENCOUNTER — Ambulatory Visit
Admission: RE | Admit: 2019-07-24 | Discharge: 2019-07-24 | Disposition: A | Discharge status: Home / Self Care | Payer: Medicare Other | Source: Ambulatory Visit | Attending: Oncology | Admitting: Oncology

## 2019-07-24 ENCOUNTER — Inpatient Hospital Stay: Payer: Medicare Other

## 2019-07-24 ENCOUNTER — Other Ambulatory Visit: Payer: Self-pay

## 2019-07-24 ENCOUNTER — Encounter: Payer: Self-pay | Admitting: Oncology

## 2019-07-24 VITALS — BP 95/66 | HR 116 | Temp 96.8°F | Resp 16 | Wt 131.7 lb

## 2019-07-24 VITALS — BP 117/79 | HR 99 | Temp 96.0°F | Resp 20

## 2019-07-24 DIAGNOSIS — C3492 Malignant neoplasm of unspecified part of left bronchus or lung: Secondary | ICD-10-CM

## 2019-07-24 DIAGNOSIS — Z95828 Presence of other vascular implants and grafts: Secondary | ICD-10-CM

## 2019-07-24 DIAGNOSIS — C7951 Secondary malignant neoplasm of bone: Secondary | ICD-10-CM

## 2019-07-24 DIAGNOSIS — R079 Chest pain, unspecified: Secondary | ICD-10-CM

## 2019-07-24 DIAGNOSIS — C7802 Secondary malignant neoplasm of left lung: Secondary | ICD-10-CM | POA: Diagnosis not present

## 2019-07-24 DIAGNOSIS — R140 Abdominal distension (gaseous): Secondary | ICD-10-CM | POA: Diagnosis not present

## 2019-07-24 DIAGNOSIS — I10 Essential (primary) hypertension: Secondary | ICD-10-CM | POA: Diagnosis not present

## 2019-07-24 DIAGNOSIS — K5900 Constipation, unspecified: Secondary | ICD-10-CM | POA: Diagnosis not present

## 2019-07-24 DIAGNOSIS — E876 Hypokalemia: Secondary | ICD-10-CM | POA: Diagnosis not present

## 2019-07-24 DIAGNOSIS — J948 Other specified pleural conditions: Secondary | ICD-10-CM | POA: Diagnosis not present

## 2019-07-24 DIAGNOSIS — I959 Hypotension, unspecified: Secondary | ICD-10-CM

## 2019-07-24 DIAGNOSIS — C220 Liver cell carcinoma: Secondary | ICD-10-CM

## 2019-07-24 DIAGNOSIS — T451X5A Adverse effect of antineoplastic and immunosuppressive drugs, initial encounter: Secondary | ICD-10-CM | POA: Diagnosis not present

## 2019-07-24 DIAGNOSIS — R0602 Shortness of breath: Secondary | ICD-10-CM | POA: Diagnosis not present

## 2019-07-24 DIAGNOSIS — K769 Liver disease, unspecified: Secondary | ICD-10-CM | POA: Diagnosis not present

## 2019-07-24 DIAGNOSIS — I712 Thoracic aortic aneurysm, without rupture: Secondary | ICD-10-CM | POA: Diagnosis not present

## 2019-07-24 DIAGNOSIS — Z5111 Encounter for antineoplastic chemotherapy: Secondary | ICD-10-CM | POA: Diagnosis not present

## 2019-07-24 DIAGNOSIS — G620 Drug-induced polyneuropathy: Secondary | ICD-10-CM | POA: Diagnosis not present

## 2019-07-24 DIAGNOSIS — G893 Neoplasm related pain (acute) (chronic): Secondary | ICD-10-CM | POA: Diagnosis not present

## 2019-07-24 DIAGNOSIS — I700 Atherosclerosis of aorta: Secondary | ICD-10-CM | POA: Diagnosis not present

## 2019-07-24 DIAGNOSIS — Z5112 Encounter for antineoplastic immunotherapy: Secondary | ICD-10-CM | POA: Diagnosis not present

## 2019-07-24 LAB — URINALYSIS, COMPLETE (UACMP) WITH MICROSCOPIC
Bacteria, UA: NONE SEEN
Bilirubin Urine: NEGATIVE
Glucose, UA: NEGATIVE mg/dL
Hgb urine dipstick: NEGATIVE
Ketones, ur: NEGATIVE mg/dL
Leukocytes,Ua: NEGATIVE
Nitrite: NEGATIVE
Protein, ur: NEGATIVE mg/dL
Specific Gravity, Urine: 1.019 (ref 1.005–1.030)
pH: 5 (ref 5.0–8.0)

## 2019-07-24 LAB — CBC WITH DIFFERENTIAL/PLATELET
Abs Immature Granulocytes: 0.02 10*3/uL (ref 0.00–0.07)
Basophils Absolute: 0 10*3/uL (ref 0.0–0.1)
Basophils Relative: 0 %
Eosinophils Absolute: 0 10*3/uL (ref 0.0–0.5)
Eosinophils Relative: 0 %
HCT: 32.3 % — ABNORMAL LOW (ref 39.0–52.0)
Hemoglobin: 11.1 g/dL — ABNORMAL LOW (ref 13.0–17.0)
Immature Granulocytes: 1 %
Lymphocytes Relative: 12 %
Lymphs Abs: 0.3 10*3/uL — ABNORMAL LOW (ref 0.7–4.0)
MCH: 29 pg (ref 26.0–34.0)
MCHC: 34.4 g/dL (ref 30.0–36.0)
MCV: 84.3 fL (ref 80.0–100.0)
Monocytes Absolute: 0 10*3/uL — ABNORMAL LOW (ref 0.1–1.0)
Monocytes Relative: 1 %
Neutro Abs: 2.3 10*3/uL (ref 1.7–7.7)
Neutrophils Relative %: 86 %
Platelets: 338 10*3/uL (ref 150–400)
RBC: 3.83 MIL/uL — ABNORMAL LOW (ref 4.22–5.81)
RDW: 17.1 % — ABNORMAL HIGH (ref 11.5–15.5)
WBC: 2.6 10*3/uL — ABNORMAL LOW (ref 4.0–10.5)
nRBC: 0 % (ref 0.0–0.2)

## 2019-07-24 LAB — COMPREHENSIVE METABOLIC PANEL
ALT: 54 U/L — ABNORMAL HIGH (ref 0–44)
AST: 55 U/L — ABNORMAL HIGH (ref 15–41)
Albumin: 3.1 g/dL — ABNORMAL LOW (ref 3.5–5.0)
Alkaline Phosphatase: 62 U/L (ref 38–126)
Anion gap: 10 (ref 5–15)
BUN: 16 mg/dL (ref 8–23)
CO2: 25 mmol/L (ref 22–32)
Calcium: 9.2 mg/dL (ref 8.9–10.3)
Chloride: 101 mmol/L (ref 98–111)
Creatinine, Ser: 0.59 mg/dL — ABNORMAL LOW (ref 0.61–1.24)
GFR calc Af Amer: >60 mL/min (ref >60)
GFR calc non Af Amer: >60 mL/min (ref >60)
Glucose, Bld: 173 mg/dL — ABNORMAL HIGH (ref 70–99)
Potassium: 3.7 mmol/L (ref 3.5–5.1)
Sodium: 136 mmol/L (ref 135–145)
Total Bilirubin: 0.8 mg/dL (ref 0.3–1.2)
Total Protein: 7 g/dL (ref 6.5–8.1)

## 2019-07-24 LAB — TROPONIN I (HIGH SENSITIVITY): Troponin I (High Sensitivity): 5 ng/L (ref <18)

## 2019-07-24 LAB — PROCALCITONIN: Procalcitonin: <0.1 ng/mL

## 2019-07-24 LAB — LACTIC ACID, PLASMA: Lactic Acid, Venous: 1.4 mmol/L (ref 0.5–1.9)

## 2019-07-24 MED ORDER — SODIUM CHLORIDE 0.9% FLUSH
10.0000 mL | INTRAVENOUS | Status: DC | PRN
  Administered 2019-07-24: 10 mL via INTRAVENOUS
  Filled 2019-07-24: qty 10

## 2019-07-24 MED ORDER — IOHEXOL 350 MG/ML SOLN
75.0000 mL | Freq: Once | INTRAVENOUS | Status: AC | PRN
  Administered 2019-07-24: 75 mL via INTRAVENOUS

## 2019-07-24 MED ORDER — HEPARIN SOD (PORK) LOCK FLUSH 100 UNIT/ML IV SOLN
500.0000 [IU] | Freq: Once | INTRAVENOUS | Status: AC
  Administered 2019-07-24: 500 [IU] via INTRAVENOUS
  Filled 2019-07-24: qty 5

## 2019-07-24 MED ORDER — HEPARIN SOD (PORK) LOCK FLUSH 100 UNIT/ML IV SOLN
INTRAVENOUS | Status: AC
  Filled 2019-07-24: qty 5

## 2019-07-24 MED ORDER — SODIUM CHLORIDE 0.9 % IV SOLN
INTRAVENOUS | Status: DC
  Filled 2019-07-24 (×2): qty 250

## 2019-07-24 NOTE — Progress Notes (Signed)
Hematology/Oncology follow up note [facility] Telephone:([phone]) [phone] [phone]   Patient Care Team: Jerrol Banana., MD as PCP - General (Family Medicine) Telford Nab, RN as Registered Nurse Clent Jacks, RN as Registered Nurse  REFERRING PROVIDER: Jerrol Banana.,*  CHIEF COMPLAINTS/REASON FOR VISIT:  metastatic cancer management.  HISTORY OF PRESENTING ILLNESS:   Samuel Maher. is a  69 y.o.  male with PMH listed below was seen in consultation at the request of  Jerrol Banana.,*  for evaluation of lung nodule in the liver lesion. Patient has a past medical history of hypertension, alcohol abuse, smoking emphysema.  He has had serial CTs done in the past.  CT images were independently reviewed by me. 07/26/2017 CT chest with contrast showed extensive pleural-parenchymal scarring within the left upper lobe with several areas of soft tissue nodularity measuring up to 1.6 cm.  In this patient who is at increased risk of lung cancer further investigation with PET scan is recommended. Small chronic appearance loculated hydropneumothorax overlies the left apex. Slowly enlarging lesion within the central portion of the right lobe of liver identified. Per note, patient supposed to have additional work-up done in December repeat CT scan which he did not  Patient was recently seen by primary care provider and has subsequent image done for follow-up. CT on 06/30/2018 showed multiple significantly enlarged paraspinal mass are noted concerning.  The largest measures 3.8 x 0.5 cm and there appears to be a lytic destruction of the adjacent T11 vertebral body.  Also noted is new solid density measuring 17 x 12 mm in the pleural parenchymal density in the left upper lobe noted on prior exam.  Concerning for malignancy.  4.4 ascending thoracic aortic aneurysm.  Recommend annual imaging.  Patient had PET scan done on 07/06/2018 Which showed  there are 2 FDG avid lesion within the left upper lobe worrisome for primary lung neoplasm.  There is evidence of chest wall involvement.  Metastasis to the posterior mediastinum is identified with extension into T11 vertebra and possible involvement of the left T12 neural foramina.  Patient reports left shoulder pain.  Smoking half a pack a day not motivated with further questioning quitting Denies weight loss, hemoptysis, cough.  Chronic shortness of breath with exertion. He drinks alcohol daily. Lives with his girlfriend.  He has 5 adult kids  #Hepatitis C  # 6/1?2020  CT-guided biopsy of left-sided posterior mediastinal soft tissue adjacent to T10/11. Patient's case was discussed on tumor board. Consensus was reached that [facility] versus primary lung cancer with hepatoid adenocarcinoma features. Consensus was to proceed with liver biopsy first as liver mass was not FDG avid on PET scan.  Patient underwent liver biopsy and the present to discuss pathology reports.  ## 08/08/2018 Liver mass biopsy showed fragments of necrotic and calcified tissue with adjacent fibrous capsule Scant viable nonneoplastic liver tissue with steatosis, inflammation and non specific fibrosis.  # 08/28/2018 CT guided left upper lobe lung mass biopsy showed poorly differentiated adenocarcinoma of lung origin.  6/19-08/16/2018  Finished palliative radiation to paraspinal soft tissue mass. 09/07/2018 patient was started on lenvatinib 12 mg daily. 10/13/2018 SBRT to lung cancer lesion.  #Patient is on Zometa monthly #He was advised to take calcium supplement.  He reports he quit taking calcium as he broke out rash on her scalp, neck and chest after taking calcium. # 03/30/19 liver lesion biopsy showed poorly differentiated adenocarcinoma, compatible with lung primary.  INTERVAL  HISTORY Samuel Scibilia. is a 69 y.o. male who has above history reviewed by me today presents for follow up visit for symptom management.  Patient  reports feeling tired and cold. No fever, but feels sweating.  No nausea, vomiting diarrhea, denies dysuria.  Intermittent chest pain, no exacerbating factors, he rates it 6/10.  Poor appetite, lost 3 pounds since last visit.  Despite the advise of hold his BP medication at last visit, he has been taking amlodipine 94m daily, including this morning.   Review of Systems  Constitutional: Positive for appetite change, fatigue and unexpected weight change. Negative for chills and fever.  HENT:   Negative for hearing loss and voice change.   Eyes: Negative for eye problems and icterus.  Respiratory: Positive for chest tightness. Negative for cough and shortness of breath.   Cardiovascular: Negative for chest pain and leg swelling.  Gastrointestinal: Negative for abdominal distention, abdominal pain and constipation.  Endocrine: Negative for hot flashes.  Genitourinary: Negative for difficulty urinating, dysuria and frequency.   Musculoskeletal: Negative for arthralgias.  Skin: Negative for itching and rash.  Neurological: Negative for light-headedness and numbness.  Hematological: Negative for adenopathy. Does not bruise/bleed easily.  Psychiatric/Behavioral: Negative for confusion.    MEDICAL HISTORY:  Past Medical History:  Diagnosis Date  . Hepatocellular [place] 07/24/2018  . History of angiography    left lower extremity  . Hypertension   . [place] bowel obstruction [place]     SURGICAL HISTORY: Past Surgical History:  Procedure Laterality Date  . COLON SURGERY    . COLONOSCOPY W/ POLYPECTOMY    . COLONOSCOPY WITH PROPOFOL N/A 01/31/2018   Procedure: COLONOSCOPY WITH PROPOFOL;  Surgeon: Toledo, TBenay Pike MD;  Location: [facility] ENDOSCOPY;  Service: Gastroenterology;  Laterality: N/A;  . debridement fasciotomy leg, left Left 03/19/2016  . LAPAROTOMY N/A 09/27/2014   Procedure: EXPLORATORY LAPAROTOMY;  Surgeon: RDia CrawfordIII, MD;  Location: [facility] ORS;   Service: General;  Laterality: N/A;  . PORTA CATH INSERTION N/A 04/10/2019   Procedure: PORTA CATH INSERTION;  Surgeon: SKatha Cabal MD;  [place] [facility];  Service: Cardiovascular;  Laterality: N/A;    SOCIAL HISTORY: Social History   Socioeconomic History  . Marital status: Single    Spouse name: Not on file  . Number of children: Not on file  . Years of education: Not on file  . Highest education level: Not on file  Occupational History  . Occupation: retired  Tobacco Use  . Smoking status: Former Smoker    Packs/day: 0.25    Years: 50.00    Pack years: 12.50  . Smokeless tobacco: Never Used  . Tobacco comment: 1-2/week  Substance and Sexual Activity  . Alcohol use: Not Currently    Alcohol/week: 35.0 standard drinks    Types: 30 Cans of beer, 5 Shots of liquor per week  . Drug use: No  . Sexual activity: Not on file  Other Topics Concern  . Not on file  Social History Narrative  . Not on file   Social Determinants of Health   Financial Resource Strain:   . Difficulty of Paying Living Expenses:   Food Insecurity:   . Worried About RCharity fundraiserin the Last Year:   . RArboriculturistin the Last Year:   Transportation Needs:   . LFilm/video editor(Medical):   .Marland KitchenLack of Transportation (Non-Medical):   Physical Activity:   .  Days of Exercise per Week:   . Minutes of Exercise per Session:   Stress:   . Feeling of Stress :   Social Connections:   . Frequency of Communication with Friends and Family:   . Frequency of Social Gatherings with Friends and Family:   . Attends Religious Services:   . Active Member of Clubs or Organizations:   . Attends Archivist Meetings:   Marland Kitchen Marital Status:   Intimate Partner Violence:   . Fear of Current or Ex-Partner:   . Emotionally Abused:   Marland Kitchen Physically Abused:   . Sexually Abused:     FAMILY HISTORY: Family History  Problem Relation Age of Onset  . Cancer Mother   . Cancer  Father     ALLERGIES:  is allergic to 5-alpha reductase inhibitors.  MEDICATIONS:  Current Outpatient Medications  Medication Sig Dispense Refill  . amLODipine (NORVASC) 5 MG tablet Take 1 tablet (5 mg total) by mouth daily. 30 tablet 0  . dexamethasone (DECADRON) 4 MG tablet Take 2 tablets (8 mg total) by mouth daily. Start the day after carboplatin chemotherapy for 3 days. 30 tablet 1  . fentaNYL (DURAGESIC) 25 MCG/HR Place 1 patch onto the skin every 3 (three) days. 10 patch 0  . lidocaine-prilocaine (EMLA) cream Apply to affected area once (Patient taking differently: Apply 1 application topically daily as needed (port acess). ) 30 g 3  . loperamide (IMODIUM) 2 MG capsule Take 1 capsule (2 mg total) by mouth See admin instructions. With onset of loose stool, take 60m followed by 287mevery 2 hours until 12 hours have passed without loose bowel movement. Maximum: 16 mg/day (Patient not taking: Reported on 07/18/2019) 60 capsule 1  . megestrol (MEGACE) 40 MG tablet Take 1 tablet (40 mg total) by mouth 2 (two) times daily. 60 tablet 0  . Multiple Vitamin (MULTIVITAMIN) capsule Take 1 capsule by mouth daily.    . Marland KitchenARCAN 4 MG/0.1ML LIQD nasal spray kit 1 spray once.    . ondansetron (ZOFRAN) 8 MG tablet Take 1 tablet (8 mg total) by mouth 2 (two) times daily as needed for refractory nausea / vomiting. Start on day 3 after carboplatin chemo. 30 tablet 1  . oxyCODONE (ROXICODONE) 5 MG immediate release tablet Take 1 tablet (5 mg total) by mouth every 8 (eight) hours as needed for severe pain or breakthrough pain. 60 tablet 0  . polyethylene glycol (MIRALAX / GLYCOLAX) 17 g packet Take 17 g by mouth daily. 14 each 0  . potassium chloride SA (KLOR-CON M20) 20 MEQ tablet Take 1 tablet (20 mEq total) by mouth daily. 30 tablet 0  . prochlorperazine (COMPAZINE) 10 MG tablet Take 1 tablet (10 mg total) by mouth every 6 (six) hours as needed (Nausea or vomiting). 30 tablet 1  . senna (SENOKOT) 8.6 MG TABS  tablet Take 1 tablet (8.6 mg total) by mouth daily. 120 tablet 0   No current facility-administered medications for this visit.     PHYSICAL EXAMINATION: ECOG PERFORMANCE STATUS: 1 - Symptomatic but completely ambulatory Vitals:   07/24/19 1309 07/24/19 1310  BP: (!) 89/66 95/66  Pulse: (!) 116 (!) 116  Resp:    Temp:     Filed Weights   07/24/19 1302  Weight: 131 lb 11.2 oz (59.7 kg)    Physical Exam Constitutional:      General: He is not in acute distress.    Appearance: He is ill-appearing.  HENT:     Head:  Normocephalic and atraumatic.  Eyes:     General: No scleral icterus. Cardiovascular:     Rate and Rhythm: Regular rhythm. Tachycardia present.     Heart sounds: Normal heart sounds.  Pulmonary:     Effort: Pulmonary effort is normal. No respiratory distress.     Breath sounds: No wheezing or rales.  Abdominal:     General: Bowel sounds are normal. There is no distension.     Palpations: Abdomen is soft.  Musculoskeletal:        General: No deformity. Normal range of motion.     Cervical back: Normal range of motion and neck supple.  Skin:    General: Skin is warm and dry.     Findings: No erythema or rash.  Neurological:     Mental Status: He is alert and oriented to person, place, and time. Mental status is at baseline.     Cranial Nerves: No cranial nerve deficit.     Coordination: Coordination normal.  Psychiatric:        Mood and Affect: Mood normal.     LABORATORY DATA:  I have reviewed the data as listed Lab Results  Component Value Date   WBC 7.2 07/18/2019   HGB 10.6 (L) 07/18/2019   HCT 31.6 (L) 07/18/2019   MCV 87.3 07/18/2019   PLT 380 07/18/2019   Recent Labs    06/20/19 0754 07/11/19 0801 07/18/19 0757  NA 139 137 139  K 3.9 2.7* 3.6  CL 105 102 103  CO2 _0 GLUCOSE 98 125* 136*  BUN 7* 6* 5*  CREATININE 0.67 0.57* 0.52*  CALCIUM 8.8* 8.5* 8.8*  GFRNONAA >60 >60 >60  GFRAA >60 >60 >60  PROT 6.7 7.1 7.1  ALBUMIN  3.3* 3.0* 2.9*  AST 42* 32 31  ALT 42 27 22  ALKPHOS 82 64 65  BILITOT 0.5 0.8 0.7   Iron/TIBC/Ferritin/ %Sat No results found for: IRON, TIBC, FERRITIN, IRONPCTSAT   RADIOGRAPHIC STUDIES: I have personally reviewed the radiological images as listed and agreed with the findings in the report. CT Chest W Contrast  Result Date: 07/04/2019 CLINICAL DATA:  Stage IV adenocarcinoma of the lung. EXAM: CT CHEST, ABDOMEN, AND PELVIS WITH CONTRAST TECHNIQUE: Multidetector CT imaging of the chest, abdomen and pelvis was performed following the standard protocol during bolus administration of intravenous contrast. CONTRAST:  33m OMNIPAQUE IOHEXOL 300 MG/ML  SOLN COMPARISON:  03/26/2019 FINDINGS: CT CHEST FINDINGS Cardiovascular: The heart size is normal. No substantial pericardial effusion. Coronary artery calcification is evident. Atherosclerotic calcification is noted in the wall of the thoracic aorta. Ascending thoracic aorta measures 4.1 cm diameter. Right-sided Port-A-Cath is looped in the right jugular vein with the tip directed centrally at the innominate vein confluence. Mediastinum/Nodes: No mediastinal lymphadenopathy. There is no hilar lymphadenopathy. There is no axillary lymphadenopathy. The esophagus has normal imaging features. Lungs/Pleura: Centrilobular and paraseptal emphysema evident. Bullous change noted in the apices, left greater than right. Architectural distortion with patchy ill-defined consolidative opacity in the anterior left upper lobe is similar to prior and compatible with sequelae of prior therapy. No new suspicious pulmonary nodule or mass. No pleural effusion. Musculoskeletal: Lucent lesion in the T11 vertebral body is stable. Small focus of left paraspinal soft tissue at T11 is unchanged. CT ABDOMEN PELVIS FINDINGS Hepatobiliary: Central liver mass (segment VIII) is stable at 2.9 x 2.8 cm today compared to 3.1 x 2.9 cm previously. Smaller immediately adjacent lesion measures 1.3  x 1.4 cm today (  59/2) compared to 1.5 x 1.5 cm previously. Ill-defined hypoenhancing lesion posterior right liver (66/2) measures 1.5 x 1.5 cm today compared to 2.2 x 2.4 cm previously. A very subtle 7 mm hypoattenuating lesion in the posterior right liver identified on the previous study is not discernible on today's exam. Calcified gallstones again noted. No intrahepatic or extrahepatic biliary dilation. Pancreas: No focal mass lesion. No dilatation of the main duct. No intraparenchymal cyst. No peripancreatic edema. Spleen: No splenomegaly. No focal mass lesion. Adrenals/Urinary Tract: No adrenal nodule or mass. Small right renal cysts are stable. Left kidney unremarkable. No evidence for hydroureter. The urinary bladder appears normal for the degree of distention. Stomach/Bowel: Stomach is unremarkable. No gastric wall thickening. No evidence of outlet obstruction. Duodenum is normally positioned as is the ligament of Treitz. No small bowel wall thickening. No small bowel dilatation. The terminal ileum is normal. The appendix is not visualized, but there is no edema or inflammation in the region of the cecum. Left colon anastomosis again noted. Vascular/Lymphatic: There is abdominal aortic atherosclerosis without aneurysm. There is no gastrohepatic or hepatoduodenal ligament lymphadenopathy. No retroperitoneal or mesenteric lymphadenopathy. No pelvic sidewall lymphadenopathy. Reproductive: The prostate gland and seminal vesicles are unremarkable. Other: No intraperitoneal free fluid. Musculoskeletal: No worrisome lytic or sclerotic osseous abnormality. IMPRESSION: 1. No new or progressive findings in the chest, abdomen, or pelvis. 2. Hepatic metastases have decreased in the interval and a very subtle 7 mm hypoattenuating lesion in the posterior right liver identified on the previous study is not discernible on today's exam. 3. Stable appearance of the lucent lesion in the T11 vertebral body with no change in the  small focus of adjacent left paraspinal soft tissue attenuation. 4. The right-sided Port-A-Cath is looped in the right internal jugular vein with the tip directed centrally at the level of the innominate vein confluence. 5. Cholelithiasis. 6. Ascending thoracic aortic aneurysm at 4.1 cm diameter. Continued attention on follow-up recommended. 7. Aortic Atherosclerosis (ICD10-I70.0) and Emphysema (ICD10-J43.9). Electronically Signed   By: Misty Stanley M.D.   On: 07/04/2019 09:07   CT Abdomen Pelvis W Contrast  Result Date: 07/04/2019 CLINICAL DATA:  Stage IV adenocarcinoma of the lung. EXAM: CT CHEST, ABDOMEN, AND PELVIS WITH CONTRAST TECHNIQUE: Multidetector CT imaging of the chest, abdomen and pelvis was performed following the standard protocol during bolus administration of intravenous contrast. CONTRAST:  71m OMNIPAQUE IOHEXOL 300 MG/ML  SOLN COMPARISON:  03/26/2019 FINDINGS: CT CHEST FINDINGS Cardiovascular: The heart size is normal. No substantial pericardial effusion. Coronary artery calcification is evident. Atherosclerotic calcification is noted in the wall of the thoracic aorta. Ascending thoracic aorta measures 4.1 cm diameter. Right-sided Port-A-Cath is looped in the right jugular vein with the tip directed centrally at the innominate vein confluence. Mediastinum/Nodes: No mediastinal lymphadenopathy. There is no hilar lymphadenopathy. There is no axillary lymphadenopathy. The esophagus has normal imaging features. Lungs/Pleura: Centrilobular and paraseptal emphysema evident. Bullous change noted in the apices, left greater than right. Architectural distortion with patchy ill-defined consolidative opacity in the anterior left upper lobe is similar to prior and compatible with sequelae of prior therapy. No new suspicious pulmonary nodule or mass. No pleural effusion. Musculoskeletal: Lucent lesion in the T11 vertebral body is stable. Small focus of left paraspinal soft tissue at T11 is unchanged. CT  ABDOMEN PELVIS FINDINGS Hepatobiliary: Central liver mass (segment VIII) is stable at 2.9 x 2.8 cm today compared to 3.1 x 2.9 cm previously. Smaller immediately adjacent lesion measures 1.3 x 1.4 cm  today (59/2) compared to 1.5 x 1.5 cm previously. Ill-defined hypoenhancing lesion posterior right liver (66/2) measures 1.5 x 1.5 cm today compared to 2.2 x 2.4 cm previously. A very subtle 7 mm hypoattenuating lesion in the posterior right liver identified on the previous study is not discernible on today's exam. Calcified gallstones again noted. No intrahepatic or extrahepatic biliary dilation. Pancreas: No focal mass lesion. No dilatation of the main duct. No intraparenchymal cyst. No peripancreatic edema. Spleen: No splenomegaly. No focal mass lesion. Adrenals/Urinary Tract: No adrenal nodule or mass. Small right renal cysts are stable. Left kidney unremarkable. No evidence for hydroureter. The urinary bladder appears normal for the degree of distention. Stomach/Bowel: Stomach is unremarkable. No gastric wall thickening. No evidence of outlet obstruction. Duodenum is normally positioned as is the ligament of Treitz. No small bowel wall thickening. No small bowel dilatation. The terminal ileum is normal. The appendix is not visualized, but there is no edema or inflammation in the region of the cecum. Left colon anastomosis again noted. Vascular/Lymphatic: There is abdominal aortic atherosclerosis without aneurysm. There is no gastrohepatic or hepatoduodenal ligament lymphadenopathy. No retroperitoneal or mesenteric lymphadenopathy. No pelvic sidewall lymphadenopathy. Reproductive: The prostate gland and seminal vesicles are unremarkable. Other: No intraperitoneal free fluid. Musculoskeletal: No worrisome lytic or sclerotic osseous abnormality. IMPRESSION: 1. No new or progressive findings in the chest, abdomen, or pelvis. 2. Hepatic metastases have decreased in the interval and a very subtle 7 mm hypoattenuating  lesion in the posterior right liver identified on the previous study is not discernible on today's exam. 3. Stable appearance of the lucent lesion in the T11 vertebral body with no change in the small focus of adjacent left paraspinal soft tissue attenuation. 4. The right-sided Port-A-Cath is looped in the right internal jugular vein with the tip directed centrally at the level of the innominate vein confluence. 5. Cholelithiasis. 6. Ascending thoracic aortic aneurysm at 4.1 cm diameter. Continued attention on follow-up recommended. 7. Aortic Atherosclerosis (ICD10-I70.0) and Emphysema (ICD10-J43.9). Electronically Signed   By: Misty Stanley M.D.   On: 07/04/2019 09:07   DG Abdomen Acute W/Chest  Result Date: 05/14/2019 CLINICAL DATA:  Abdominal distension and constipation EXAM: DG ABDOMEN ACUTE W/ 1V CHEST COMPARISON:  03/26/2019 CT FINDINGS: Normal heart size and stable mediastinal contours. Post treatment scarring in the left upper lobe. There is no edema, consolidation, effusion, or pneumothorax. Porta catheter with tip at the upper SVC. Moderate stool volume seen along the flanks. No worrisome bowel dilatation. No concerning mass effect or gas collection. Lumbar spine degeneration IMPRESSION: Nonobstructive bowel gas pattern with moderate stool volume. Electronically Signed   By: Monte Fantasia M.D.   On: 05/14/2019 07:16      ASSESSMENT & PLAN:  1. Stage IV adenocarcinoma of lung, left ([place])   2. Hepatocellular carcinoma metastatic to left lung ([place])   3. Hypotension, unspecified hypotension type   4. Chest pain, unspecified type    #Stage IV Lung adenocarcinoma, Stage IV HCC-07/17/2018 paraspinal soft tissue biopsy Patient is on palliative chemotherapy with carboplatin, Taxol, bevacizumab, Tecentriq.  # hypotension and tachycardia and chest pain He has taken his BP medication despite my advise during last week regarding to hold amlodipine. Last dose this morning.  Etiology unknown,  differential includes, hypovolemia due to poor oral intake, cardiac, infection/sepsis, adrenal insufficiency.   Check lactic acid, procalcitonin, UA. Check morning cortisol,  I discussed with him about going to ER for further work and he declined.  I will give him  1L of IV normal saline.  Chest STAT Chest CT PE protocol.  Further management per CT, lab results and his vital signs after IV fluid.    #Severe hypokalemia, potassium level is normal today.  Continue potassium 77mq daily.   #Weight loss, likely due to decreased oral intake. He has started on megace 435mBID. No improved of his appetite yet.    #Neoplasm related pain, continue fentanyl patch and oxycodone. We spent sufficient time to discuss many aspect of care, questions were answered to patient's satisfaction. All questions were answered. The patient knows to call the clinic with any problems questions or concerns.   ZhEarlie ServerMD, PhD 07/24/2019

## 2019-07-24 NOTE — Progress Notes (Signed)
Patient is feeling weak, cold, and having sweating episodes.  Does not have an appetite and has 3 lb wt loss.  His pain is 6/10 on pain scale today and is located in mid chest area.

## 2019-07-25 ENCOUNTER — Inpatient Hospital Stay: Payer: Medicare Other

## 2019-07-25 VITALS — BP 105/73 | HR 98 | Temp 97.8°F | Resp 18

## 2019-07-25 DIAGNOSIS — J948 Other specified pleural conditions: Secondary | ICD-10-CM | POA: Diagnosis not present

## 2019-07-25 DIAGNOSIS — I700 Atherosclerosis of aorta: Secondary | ICD-10-CM | POA: Diagnosis not present

## 2019-07-25 DIAGNOSIS — T451X5A Adverse effect of antineoplastic and immunosuppressive drugs, initial encounter: Secondary | ICD-10-CM | POA: Diagnosis not present

## 2019-07-25 DIAGNOSIS — G620 Drug-induced polyneuropathy: Secondary | ICD-10-CM | POA: Diagnosis not present

## 2019-07-25 DIAGNOSIS — C3492 Malignant neoplasm of unspecified part of left bronchus or lung: Secondary | ICD-10-CM

## 2019-07-25 DIAGNOSIS — C7951 Secondary malignant neoplasm of bone: Secondary | ICD-10-CM | POA: Diagnosis not present

## 2019-07-25 DIAGNOSIS — K769 Liver disease, unspecified: Secondary | ICD-10-CM | POA: Diagnosis not present

## 2019-07-25 DIAGNOSIS — C7802 Secondary malignant neoplasm of left lung: Secondary | ICD-10-CM

## 2019-07-25 DIAGNOSIS — I712 Thoracic aortic aneurysm, without rupture: Secondary | ICD-10-CM | POA: Diagnosis not present

## 2019-07-25 DIAGNOSIS — C220 Liver cell carcinoma: Secondary | ICD-10-CM | POA: Diagnosis not present

## 2019-07-25 DIAGNOSIS — Z5112 Encounter for antineoplastic immunotherapy: Secondary | ICD-10-CM | POA: Diagnosis not present

## 2019-07-25 DIAGNOSIS — Z5111 Encounter for antineoplastic chemotherapy: Secondary | ICD-10-CM | POA: Diagnosis not present

## 2019-07-25 DIAGNOSIS — R140 Abdominal distension (gaseous): Secondary | ICD-10-CM | POA: Diagnosis not present

## 2019-07-25 DIAGNOSIS — I10 Essential (primary) hypertension: Secondary | ICD-10-CM | POA: Diagnosis not present

## 2019-07-25 DIAGNOSIS — E876 Hypokalemia: Secondary | ICD-10-CM | POA: Diagnosis not present

## 2019-07-25 DIAGNOSIS — G893 Neoplasm related pain (acute) (chronic): Secondary | ICD-10-CM | POA: Diagnosis not present

## 2019-07-25 DIAGNOSIS — K5900 Constipation, unspecified: Secondary | ICD-10-CM | POA: Diagnosis not present

## 2019-07-25 MED ORDER — SODIUM CHLORIDE 0.9 % IV SOLN
INTRAVENOUS | Status: DC
  Filled 2019-07-25 (×2): qty 250

## 2019-07-25 MED ORDER — HEPARIN SOD (PORK) LOCK FLUSH 100 UNIT/ML IV SOLN
500.0000 [IU] | Freq: Once | INTRAVENOUS | Status: AC | PRN
  Administered 2019-07-25: 500 [IU]
  Filled 2019-07-25: qty 5

## 2019-07-30 ENCOUNTER — Ambulatory Visit: Payer: Self-pay | Admitting: Family Medicine

## 2019-07-30 NOTE — Telephone Encounter (Signed)
Pt reports both feet swelling "Up a little past ankles." Onset 2 weeks ago. Denies redness or warmth. States painful 8/10. Pt is evasive historian and has angry affect with triage. "Just want appt with Dr. Sharyn Lull today or tomorrow. I don't feel good and I don't want to answer any questions." States he has appt 08/14/2019 with her and appt with oncologist 08/08/2019. States "If I can't see her this week I'll just see my cancer doctor on the 23rd." Pt reluctant to continue triage/assessment. Advised ED if symptoms worsen. Pt states "Won't be going to ED."  Assured pt NT would route to practice for PCP's review.  Advised to also alert oncologist of symptoms, pt cursed and call ended.   Please advise: [phone] Reason for Disposition  [1] MODERATE leg swelling (e.g., swelling extends up to knees) AND [2] new onset or worsening  Answer Assessment - Initial Assessment Questions 1. ONSET: "When did the swelling start?" (e.g., minutes, hours, days)     2 weeks ago 2. LOCATION: "What part of the leg is swollen?"  "Are both legs swollen or just one leg?"     Both feet "A little past ankles." 3. SEVERITY: "How bad is the swelling?" (e.g., localized; mild, moderate, severe)  - Localized - small area of swelling localized to one leg  - MILD pedal edema - swelling limited to foot and ankle, pitting edema < 1/4 inch (6 mm) deep, rest and elevation eliminate most or all swelling  - MODERATE edema - swelling of lower leg to knee, pitting edema > 1/4 inch (6 mm) deep, rest and elevation only partially reduce swelling  - SEVERE edema - swelling extends above knee, facial or hand swelling present     Moderate 4. REDNESS: "Does the swelling look red or infected?"     no 5. PAIN: "Is the swelling painful to touch?" If so, ask: "How painful is it?"   (Scale 1-10; mild, moderate or severe)     Yes,  8-9/10 6. FEVER: "Do you have a fever?" If so, ask: "What is it, how was it measured, and when did it start?"       no 7. CAUSE: "What do you think is causing the leg swelling?"     Unsure "Maybe cancer" 8. MEDICAL HISTORY: "Do you have a history of heart failure, kidney disease, liver failure, or cancer?"     Cancer 9. RECURRENT SYMPTOM: "Have you had leg swelling before?" If so, ask: "When was the last time?" "What happened that time?"     Sometimes 10. OTHER SYMPTOMS: "Do you have any other symptoms?" (e.g., chest pain, difficulty breathing)       SOB "Not new from my cancer"  Protocols used: LEG SWELLING AND EDEMA-A-AH

## 2019-07-30 NOTE — Telephone Encounter (Signed)
Ok to place with Samuel Hood at 120 or myself at 4 pm whichever patient prefers he should be seen.

## 2019-07-30 NOTE — Telephone Encounter (Signed)
Please read triage note below from our [facility] team. You have a 20 min slot it looks like tomorrow afternopon at 4OM, if you want I can place patient at that availble time slot, or if you can squeeze him in sooner please let me know. All providers schedules are booked tomorrow 07/31/19, Simona Huh does have a 1:20 Slot I can give patient if need be. I attempted to reach out to patient with no response. Please advise if patient can be fit in your scheduled today 07/31/19. KW

## 2019-07-31 ENCOUNTER — Encounter: Payer: Self-pay | Admitting: Family Medicine

## 2019-07-31 ENCOUNTER — Other Ambulatory Visit: Payer: Self-pay

## 2019-07-31 ENCOUNTER — Ambulatory Visit (INDEPENDENT_AMBULATORY_CARE_PROVIDER_SITE_OTHER): Payer: Medicare Other | Admitting: Family Medicine

## 2019-07-31 VITALS — BP 102/70 | HR 113 | Temp 97.1°F | Resp 16 | Wt 133.2 lb

## 2019-07-31 DIAGNOSIS — C3492 Malignant neoplasm of unspecified part of left bronchus or lung: Secondary | ICD-10-CM | POA: Diagnosis not present

## 2019-07-31 DIAGNOSIS — C7802 Secondary malignant neoplasm of left lung: Secondary | ICD-10-CM

## 2019-07-31 DIAGNOSIS — E876 Hypokalemia: Secondary | ICD-10-CM

## 2019-07-31 DIAGNOSIS — I10 Essential (primary) hypertension: Secondary | ICD-10-CM | POA: Diagnosis not present

## 2019-07-31 DIAGNOSIS — J439 Emphysema, unspecified: Secondary | ICD-10-CM | POA: Diagnosis not present

## 2019-07-31 DIAGNOSIS — C220 Liver cell carcinoma: Secondary | ICD-10-CM

## 2019-07-31 NOTE — Telephone Encounter (Signed)
Patient has an appt scheduled with Simona Huh this afternoon at 1:20Pm. KW

## 2019-07-31 NOTE — Progress Notes (Signed)
Established patient visit   Patient: Samuel Hood.   DOB: [birth year]   69 y.o. Male  MRN: [id number] Visit Date: 07/31/2019  Today's healthcare provider: Vernie Murders, PA   Chief Complaint  Patient presents with  . Leg Swelling   Subjective    HPI Patient here today C/O swelling in both feet x's 1 week. Patient reports shortness of breath with activity. Patient reports swelling is same day and night. Patient reports he has been elevating feet, reports no help with swelling. Patient report numbness on bottom of feet.   Patient Active Problem List   Diagnosis Date Noted  . Encounter for antineoplastic chemotherapy 07/24/2019  . Anemia 07/12/2019  . Thoracic aortic aneurysm without rupture ([place])- NOTED ON CT 06/30/2018 07/12/2019  . Bone lesion 04/06/2019  . Cancer, metastatic to bone ([place]) 12/16/2018  . Orthostatic hypotension 10/26/2018  . Screening for malignant neoplasm of prostate 10/05/2018  . Stage IV adenocarcinoma of lung, left ([place]) 09/02/2018  . Chronic active hepatitis ([place]) 07/28/2018  . Elevated PSA 07/28/2018  . Hepatocellular carcinoma metastatic to left lung ([place]) 07/24/2018  . Paraspinal mass 07/24/2018  . Goals of care, counseling/discussion 07/07/2018  . Liver mass 06/23/2018  . Lung nodule 06/23/2018  . Pulmonary emphysema ([place]) 06/23/2018  . Hypertension 03/26/2016  . Small bowel obstruction ([place])   . Intestinal obstruction ([place]) 09/26/2014  . Essential hypertension 06/19/2014  . Hypokalemia 06/19/2014  . Bowel obstruction ([place]) 06/18/2014   Past Surgical History:  Procedure Laterality Date  . COLON SURGERY    . COLONOSCOPY W/ POLYPECTOMY    . COLONOSCOPY WITH PROPOFOL N/A 01/31/2018   Procedure: COLONOSCOPY WITH PROPOFOL;  Surgeon: Toledo, Benay Pike, MD;  Location: [facility] ENDOSCOPY;  Service: Gastroenterology;  Laterality: N/A;  . debridement fasciotomy leg, left Left 03/19/2016  . LAPAROTOMY N/A 09/27/2014   Procedure: EXPLORATORY  LAPAROTOMY;  Surgeon: Dia Crawford III, MD;  Location: [facility] ORS;  Service: General;  Laterality: N/A;  . PORTA CATH INSERTION N/A 04/10/2019   Procedure: PORTA CATH INSERTION;  Surgeon: Katha Cabal, MD;  Location: [place] [facility];  Service: Cardiovascular;  Laterality: N/A;   Family History  Problem Relation Age of Onset  . Cancer Mother   . Cancer Father    Social History   Tobacco Use  . Smoking status: Former Smoker    Packs/day: 0.25    Years: 50.00    Pack years: 12.50  . Smokeless tobacco: Never Used  . Tobacco comment: 1-2/week  Vaping Use  . Vaping Use: Never used  Substance Use Topics  . Alcohol use: Not Currently    Alcohol/week: 35.0 standard drinks    Types: 30 Cans of beer, 5 Shots of liquor per week  . Drug use: No   Allergies  Allergen Reactions  . 5-Alpha Reductase Inhibitors      Medications: Outpatient Medications Prior to Visit  Medication Sig  . fentaNYL (DURAGESIC) 25 MCG/HR Place 1 patch onto the skin every 3 (three) days.  Marland Kitchen lidocaine-prilocaine (EMLA) cream Apply to affected area once (Patient taking differently: Apply 1 application topically daily as needed (port acess). )  . megestrol (MEGACE) 40 MG tablet Take 1 tablet (40 mg total) by mouth 2 (two) times daily.  . Multiple Vitamin (MULTIVITAMIN) capsule Take 1 capsule by mouth daily.  Marland Kitchen NARCAN 4 MG/0.1ML LIQD nasal spray kit 1 spray once.  . ondansetron (ZOFRAN) 8 MG tablet Take 1 tablet (8 mg total) by mouth 2 (two)  times daily as needed for refractory nausea / vomiting. Start on day 3 after carboplatin chemo.  Marland Kitchen oxyCODONE (ROXICODONE) 5 MG immediate release tablet Take 1 tablet (5 mg total) by mouth every 8 (eight) hours as needed for severe pain or breakthrough pain.  . potassium chloride SA (KLOR-CON M20) 20 MEQ tablet Take 1 tablet (20 mEq total) by mouth daily.  . prochlorperazine (COMPAZINE) 10 MG tablet Take 1 tablet (10 mg total) by mouth every 6 (six) hours as needed (Nausea or  vomiting).  . [DISCONTINUED] amLODipine (NORVASC) 5 MG tablet Take 1 tablet (5 mg total) by mouth daily.  Marland Kitchen dexamethasone (DECADRON) 4 MG tablet Take 2 tablets (8 mg total) by mouth daily. Start the day after carboplatin chemotherapy for 3 days. (Patient not taking: Reported on 07/24/2019)  . loperamide (IMODIUM) 2 MG capsule Take 1 capsule (2 mg total) by mouth See admin instructions. With onset of loose stool, take '4mg'$  followed by '2mg'$  every 2 hours until 12 hours have passed without loose bowel movement. Maximum: 16 mg/day (Patient not taking: Reported on 07/18/2019)  . polyethylene glycol (MIRALAX / GLYCOLAX) 17 g packet Take 17 g by mouth daily. (Patient not taking: Reported on 07/24/2019)  . senna (SENOKOT) 8.6 MG TABS tablet Take 1 tablet (8.6 mg total) by mouth daily. (Patient not taking: Reported on 07/24/2019)   No facility-administered medications prior to visit.    Review of Systems  Constitutional: Negative for chills and fatigue.  Respiratory: Positive for shortness of breath. Negative for cough.   Cardiovascular: Positive for leg swelling. Negative for chest pain and palpitations.  Neurological: Positive for numbness.    Last metabolic panel Lab Results  Component Value Date   GLUCOSE 173 (H) 07/24/2019   NA 136 07/24/2019   K 3.7 07/24/2019   CL 101 07/24/2019   CO2 25 07/24/2019   BUN 16 07/24/2019   CREATININE 0.59 (L) 07/24/2019   GFRNONAA >60 07/24/2019   GFRAA >60 07/24/2019   CALCIUM 9.2 07/24/2019   PHOS 4.4 09/27/2014   PROT 7.0 07/24/2019   ALBUMIN 3.1 (L) 07/24/2019   LABGLOB 3.4 06/23/2018   AGRATIO 1.2 06/23/2018   BILITOT 0.8 07/24/2019   ALKPHOS 62 07/24/2019   AST 55 (H) 07/24/2019   ALT 54 (H) 07/24/2019   ANIONGAP 10 07/24/2019   Lab Results  Component Value Date   WBC 2.6 (L) 07/24/2019   HGB 11.1 (L) 07/24/2019   HCT 32.3 (L) 07/24/2019   MCV 84.3 07/24/2019   PLT 338 07/24/2019       Objective    BP 102/70 (BP Location: Left Arm, Patient  Position: Sitting, Cuff Size: Normal)   Pulse (!) 113   Temp (!) 97.1 F (36.2 C) (Temporal)   Resp 16   Wt 133 lb 3.2 oz (60.4 kg)   SpO2 97%   BMI 22.17 kg/m  BP Readings from Last 3 Encounters:  07/31/19 102/70  07/25/19 105/73  07/24/19 117/79   Wt Readings from Last 3 Encounters:  07/31/19 133 lb 3.2 oz (60.4 kg)  07/24/19 131 lb 11.2 oz (59.7 kg)  07/18/19 134 lb 14.4 oz (61.2 kg)     Physical Exam Constitutional:      General: He is not in acute distress.    Appearance: He is well-developed.  HENT:     Head: Normocephalic and atraumatic.     Right Ear: Hearing and tympanic membrane normal.     Left Ear: Hearing and tympanic membrane normal.  Nose: Nose normal.     Mouth/Throat:     Comments: Edentulous - compensated with dentures. Eyes:     General: Lids are normal. No scleral icterus.       Right eye: No discharge.        Left eye: No discharge.     Conjunctiva/sclera: Conjunctivae normal.  Cardiovascular:     Rate and Rhythm: Regular rhythm. Tachycardia present.     Pulses: Normal pulses.     Heart sounds: Normal heart sounds.  Pulmonary:     Effort: Pulmonary effort is normal. No respiratory distress.  Abdominal:     General: Bowel sounds are normal.     Palpations: Abdomen is soft.  Musculoskeletal:        General: Normal range of motion.  Skin:    General: Skin is warm.     Findings: No lesion or rash.  Neurological:     Mental Status: He is alert and oriented to person, place, and time.     Sensory: No sensory deficit.     Comments: Normal foot exam without loss of sensation to test with nylon string bilaterally.  Psychiatric:        Speech: Speech normal.        Behavior: Behavior normal.        Thought Content: Thought content normal.    No results found for any visits on 07/31/19.  Assessment & Plan     1. Stage IV adenocarcinoma of lung, left ([place]) 2. Hepatocellular carcinoma metastatic to left lung ([place]) Followed by Dr. Tasia Catchings  (oncologist). Takes Fentanyl patches q 3 days, Oxycodone 5 mg TID prn acute pain and keeps Narcan spray kit available. Scheduled for chemotherapy every 2 weeks ("7 hr infusion" - next one will be on 08-08-19). The use of Megace 40 mg BID has helped to stabilize weight. No sign of peripheral neuropathy by nylon string test today. No peripheral edema. Continue present regimen and follow up with Dr. Tasia Catchings as planned.  3. Pulmonary emphysema, unspecified emphysema type ([place]) Still smoking approximately 1/2 ppd. Denies dyspnea or wheezing. Lungs clear to auscultation today.  4. Essential hypertension BP normal. Oncologist stopped the Amlodipine because levels have been very low. States he has stopped all ETOH intake. Recent   5. Hypokalemia Is holding KCL supplement with recent potassium level back to normal. Trying to ear fruits to maintain level.    No follow-ups on file.      Andres Shad, PA, have reviewed all documentation for this visit. The documentation on 07/31/19 for the exam, diagnosis, procedures, and orders are all accurate and complete.    Vernie Murders, [place] [phone] (phone) [phone] (fax)  [place]

## 2019-08-01 ENCOUNTER — Encounter: Payer: Self-pay | Admitting: Oncology

## 2019-08-01 ENCOUNTER — Telehealth: Payer: Self-pay | Admitting: *Deleted

## 2019-08-01 DIAGNOSIS — R600 Localized edema: Secondary | ICD-10-CM

## 2019-08-01 NOTE — Telephone Encounter (Signed)
STAT US per Dr.Yu has been scheduled as requested. I called and made pt aware of his sched 10:00am STAT US scheduled for 08/02/19 @ [place] Pt is aware.

## 2019-08-02 ENCOUNTER — Other Ambulatory Visit: Payer: Self-pay

## 2019-08-02 ENCOUNTER — Ambulatory Visit
Admission: RE | Admit: 2019-08-02 | Discharge: 2019-08-02 | Disposition: A | Discharge status: Home / Self Care | Payer: Medicare Other | Source: Ambulatory Visit | Attending: Oncology | Admitting: Oncology

## 2019-08-02 DIAGNOSIS — R600 Localized edema: Secondary | ICD-10-CM | POA: Insufficient documentation

## 2019-08-08 ENCOUNTER — Inpatient Hospital Stay (HOSPITAL_BASED_OUTPATIENT_CLINIC_OR_DEPARTMENT_OTHER): Payer: Medicare Other | Admitting: Oncology

## 2019-08-08 ENCOUNTER — Other Ambulatory Visit: Payer: Self-pay

## 2019-08-08 ENCOUNTER — Inpatient Hospital Stay: Payer: Medicare Other

## 2019-08-08 ENCOUNTER — Encounter: Payer: Self-pay | Admitting: Oncology

## 2019-08-08 VITALS — BP 135/98 | HR 110 | Temp 98.0°F | Resp 18 | Wt 132.3 lb

## 2019-08-08 DIAGNOSIS — E876 Hypokalemia: Secondary | ICD-10-CM | POA: Diagnosis not present

## 2019-08-08 DIAGNOSIS — C220 Liver cell carcinoma: Secondary | ICD-10-CM

## 2019-08-08 DIAGNOSIS — Z5111 Encounter for antineoplastic chemotherapy: Secondary | ICD-10-CM

## 2019-08-08 DIAGNOSIS — C7802 Secondary malignant neoplasm of left lung: Secondary | ICD-10-CM | POA: Diagnosis not present

## 2019-08-08 DIAGNOSIS — C3492 Malignant neoplasm of unspecified part of left bronchus or lung: Secondary | ICD-10-CM

## 2019-08-08 DIAGNOSIS — K5900 Constipation, unspecified: Secondary | ICD-10-CM | POA: Diagnosis not present

## 2019-08-08 DIAGNOSIS — R000 Tachycardia, unspecified: Secondary | ICD-10-CM

## 2019-08-08 DIAGNOSIS — G893 Neoplasm related pain (acute) (chronic): Secondary | ICD-10-CM

## 2019-08-08 DIAGNOSIS — I700 Atherosclerosis of aorta: Secondary | ICD-10-CM | POA: Diagnosis not present

## 2019-08-08 DIAGNOSIS — C7951 Secondary malignant neoplasm of bone: Secondary | ICD-10-CM | POA: Diagnosis not present

## 2019-08-08 DIAGNOSIS — G620 Drug-induced polyneuropathy: Secondary | ICD-10-CM | POA: Diagnosis not present

## 2019-08-08 DIAGNOSIS — I10 Essential (primary) hypertension: Secondary | ICD-10-CM | POA: Diagnosis not present

## 2019-08-08 DIAGNOSIS — I712 Thoracic aortic aneurysm, without rupture: Secondary | ICD-10-CM | POA: Diagnosis not present

## 2019-08-08 DIAGNOSIS — Z5112 Encounter for antineoplastic immunotherapy: Secondary | ICD-10-CM

## 2019-08-08 DIAGNOSIS — K769 Liver disease, unspecified: Secondary | ICD-10-CM | POA: Diagnosis not present

## 2019-08-08 DIAGNOSIS — R140 Abdominal distension (gaseous): Secondary | ICD-10-CM | POA: Diagnosis not present

## 2019-08-08 DIAGNOSIS — T451X5A Adverse effect of antineoplastic and immunosuppressive drugs, initial encounter: Secondary | ICD-10-CM | POA: Diagnosis not present

## 2019-08-08 DIAGNOSIS — J948 Other specified pleural conditions: Secondary | ICD-10-CM | POA: Diagnosis not present

## 2019-08-08 LAB — COMPREHENSIVE METABOLIC PANEL
ALT: 40 U/L (ref 0–44)
AST: 43 U/L — ABNORMAL HIGH (ref 15–41)
Albumin: 3.2 g/dL — ABNORMAL LOW (ref 3.5–5.0)
Alkaline Phosphatase: 75 U/L (ref 38–126)
Anion gap: 10 (ref 5–15)
BUN: 8 mg/dL (ref 8–23)
CO2: 26 mmol/L (ref 22–32)
Calcium: 8.8 mg/dL — ABNORMAL LOW (ref 8.9–10.3)
Chloride: 105 mmol/L (ref 98–111)
Creatinine, Ser: 0.52 mg/dL — ABNORMAL LOW (ref 0.61–1.24)
GFR calc Af Amer: >60 mL/min (ref >60)
GFR calc non Af Amer: >60 mL/min (ref >60)
Glucose, Bld: 112 mg/dL — ABNORMAL HIGH (ref 70–99)
Potassium: 2.8 mmol/L — ABNORMAL LOW (ref 3.5–5.1)
Sodium: 141 mmol/L (ref 135–145)
Total Bilirubin: 0.6 mg/dL (ref 0.3–1.2)
Total Protein: 6.9 g/dL (ref 6.5–8.1)

## 2019-08-08 LAB — CBC WITH DIFFERENTIAL/PLATELET
Abs Immature Granulocytes: 0.12 10*3/uL — ABNORMAL HIGH (ref 0.00–0.07)
Basophils Absolute: 0 10*3/uL (ref 0.0–0.1)
Basophils Relative: 1 %
Eosinophils Absolute: 0.1 10*3/uL (ref 0.0–0.5)
Eosinophils Relative: 1 %
HCT: 34.9 % — ABNORMAL LOW (ref 39.0–52.0)
Hemoglobin: 11.5 g/dL — ABNORMAL LOW (ref 13.0–17.0)
Immature Granulocytes: 2 %
Lymphocytes Relative: 16 %
Lymphs Abs: 1.2 10*3/uL (ref 0.7–4.0)
MCH: 28.7 pg (ref 26.0–34.0)
MCHC: 33 g/dL (ref 30.0–36.0)
MCV: 87 fL (ref 80.0–100.0)
Monocytes Absolute: 0.9 10*3/uL (ref 0.1–1.0)
Monocytes Relative: 13 %
Neutro Abs: 4.8 10*3/uL (ref 1.7–7.7)
Neutrophils Relative %: 67 %
Platelets: 306 10*3/uL (ref 150–400)
RBC: 4.01 MIL/uL — ABNORMAL LOW (ref 4.22–5.81)
RDW: 18.7 % — ABNORMAL HIGH (ref 11.5–15.5)
WBC: 7.1 10*3/uL (ref 4.0–10.5)
nRBC: 0 % (ref 0.0–0.2)

## 2019-08-08 LAB — MAGNESIUM: Magnesium: 1.7 mg/dL (ref 1.7–2.4)

## 2019-08-08 LAB — TSH: TSH: 1.77 u[IU]/mL (ref 0.350–4.500)

## 2019-08-08 LAB — PROTEIN, URINE, RANDOM: Total Protein, Urine: 19 mg/dL

## 2019-08-08 MED ORDER — OXYCODONE HCL 5 MG PO TABS: 5.0000 mg | ORAL_TABLET | Freq: Three times a day (TID) | ORAL | 0 refills | Status: DC | PRN

## 2019-08-08 MED ORDER — SODIUM CHLORIDE 0.9 % IV SOLN
Freq: Once | INTRAVENOUS | Status: AC
  Filled 2019-08-08: qty 250

## 2019-08-08 MED ORDER — GABAPENTIN 300 MG PO CAPS
300.0000 mg | ORAL_CAPSULE | Freq: Two times a day (BID) | ORAL | 0 refills | Status: DC
Start: 2019-08-08 — End: 2019-09-05

## 2019-08-08 MED ORDER — SODIUM CHLORIDE 0.9% FLUSH
10.0000 mL | Freq: Once | INTRAVENOUS | Status: AC
  Administered 2019-08-08: 10 mL via INTRAVENOUS
  Filled 2019-08-08: qty 10

## 2019-08-08 MED ORDER — POTASSIUM CHLORIDE 20 MEQ/100ML IV SOLN
20.0000 meq | Freq: Once | INTRAVENOUS | Status: AC
  Administered 2019-08-08: 20 meq via INTRAVENOUS

## 2019-08-08 MED ORDER — HEPARIN SOD (PORK) LOCK FLUSH 100 UNIT/ML IV SOLN
500.0000 [IU] | Freq: Once | INTRAVENOUS | Status: AC
  Administered 2019-08-08: 500 [IU] via INTRAVENOUS
  Filled 2019-08-08: qty 5

## 2019-08-08 MED ORDER — FENTANYL 25 MCG/HR TD PT72: 1.0000 | MEDICATED_PATCH | TRANSDERMAL | 0 refills | Status: DC

## 2019-08-08 NOTE — Progress Notes (Signed)
1947: Per Benjamine Mola RN per Dr. Tasia Catchings, no treatment today, pt to receive 20 MEQs of IV potassium over a hour only.

## 2019-08-08 NOTE — Progress Notes (Signed)
Hematology/Oncology follow up note [facility] Telephone:([phone]) [phone] [phone]   Patient Care Team: Jerrol Banana., MD as PCP - General (Family Medicine) Telford Nab, RN as Registered Nurse Clent Jacks, RN as Registered Nurse  REFERRING PROVIDER: Jerrol Banana.,*  CHIEF COMPLAINTS/REASON FOR VISIT:  metastatic cancer management.  HISTORY OF PRESENTING ILLNESS:   Samuel Hood. is a  69 y.o.  male with PMH listed below was seen in consultation at the request of  Jerrol Banana.,*  for evaluation of lung nodule in the liver lesion. Patient has a past medical history of hypertension, alcohol abuse, smoking emphysema.  He has had serial CTs done in the past.  CT images were independently reviewed by me. 07/26/2017 CT chest with contrast showed extensive pleural-parenchymal scarring within the left upper lobe with several areas of soft tissue nodularity measuring up to 1.6 cm.  In this patient who is at increased risk of lung cancer further investigation with PET scan is recommended. Small chronic appearance loculated hydropneumothorax overlies the left apex. Slowly enlarging lesion within the central portion of the right lobe of liver identified. Per note, patient supposed to have additional work-up done in December repeat CT scan which he did not  Patient was recently seen by primary care provider and has subsequent image done for follow-up. CT on 06/30/2018 showed multiple significantly enlarged paraspinal mass are noted concerning.  The largest measures 3.8 x 0.5 cm and there appears to be a lytic destruction of the adjacent T11 vertebral body.  Also noted is new solid density measuring 17 x 12 mm in the pleural parenchymal density in the left upper lobe noted on prior exam.  Concerning for malignancy.  4.4 ascending thoracic aortic aneurysm.  Recommend annual imaging.  Patient had PET scan done on 07/06/2018 Which showed  there are 2 FDG avid lesion within the left upper lobe worrisome for primary lung neoplasm.  There is evidence of chest wall involvement.  Metastasis to the posterior mediastinum is identified with extension into T11 vertebra and possible involvement of the left T12 neural foramina.  Patient reports left shoulder pain.  Smoking half a pack a day not motivated with further questioning quitting Denies weight loss, hemoptysis, cough.  Chronic shortness of breath with exertion. He drinks alcohol daily. Lives with his girlfriend.  He has 5 adult kids  #Hepatitis C  # 6/1?2020  CT-guided biopsy of left-sided posterior mediastinal soft tissue adjacent to T10/11. Patient's case was discussed on tumor board. Consensus was reached that [facility] versus primary lung cancer with hepatoid adenocarcinoma features. Consensus was to proceed with liver biopsy first as liver mass was not FDG avid on PET scan.  Patient underwent liver biopsy and the present to discuss pathology reports.  ## 08/08/2018 Liver mass biopsy showed fragments of necrotic and calcified tissue with adjacent fibrous capsule Scant viable nonneoplastic liver tissue with steatosis, inflammation and non specific fibrosis.  # 08/28/2018 CT guided left upper lobe lung mass biopsy showed poorly differentiated adenocarcinoma of lung origin.  6/19-08/16/2018  Finished palliative radiation to paraspinal soft tissue mass. 09/07/2018 patient was started on lenvatinib 12 mg daily. 10/13/2018 SBRT to lung cancer lesion.  #Patient is on Zometa monthly #He was advised to take calcium supplement.  He reports he quit taking calcium as he broke out rash on her scalp, neck and chest after taking calcium. # 03/30/19 liver lesion biopsy showed poorly differentiated adenocarcinoma, compatible with lung primary.  INTERVAL  HISTORY Samuel Hood. is a 69 y.o. male who has above history reviewed by me today presents for evaluation prior to chemotherapy for lung cancer and  HCC. Patient reports feeling better today.  His blood pressure is 135/98. Chest tightness has improved.  He now complains bilateral hand and feet pain.  He describes as aches.  Denies numbness or tingling sensation.He request refill of fentanyl patch and oxycodone.  He reports taking 6 oxycodone per day for hand and feet pain.Appetite is fair. He is currently not on potassium supplementation. Heart rate is high in the clinic, 110.  He denies any palpitation or shortness of breath. Amlodipine has been hold due to borderline low blood pressure during past visits.   Review of Systems  Constitutional: Positive for fatigue and unexpected weight change. Negative for appetite change, chills and fever.  HENT:   Negative for hearing loss and voice change.   Eyes: Negative for eye problems and icterus.  Respiratory: Negative for chest tightness, cough and shortness of breath.   Cardiovascular: Negative for chest pain and leg swelling.  Gastrointestinal: Negative for abdominal distention, abdominal pain and constipation.  Endocrine: Negative for hot flashes.  Genitourinary: Negative for difficulty urinating, dysuria and frequency.   Musculoskeletal: Negative for arthralgias.  Skin: Negative for itching and rash.  Neurological: Negative for light-headedness and numbness.  Hematological: Negative for adenopathy. Does not bruise/bleed easily.  Psychiatric/Behavioral: Negative for confusion.    MEDICAL HISTORY:  Past Medical History:  Diagnosis Date  . Hepatocellular carcinoma metastatic to left lung ([place]) 07/24/2018  . History of angiography    left lower extremity  . Hypertension   . Small bowel obstruction ([place])     SURGICAL HISTORY: Past Surgical History:  Procedure Laterality Date  . COLON SURGERY    . COLONOSCOPY W/ POLYPECTOMY    . COLONOSCOPY WITH PROPOFOL N/A 01/31/2018   Procedure: COLONOSCOPY WITH PROPOFOL;  Surgeon: Toledo, Benay Pike, MD;  Location: [facility] ENDOSCOPY;  Service:  Gastroenterology;  Laterality: N/A;  . debridement fasciotomy leg, left Left 03/19/2016  . LAPAROTOMY N/A 09/27/2014   Procedure: EXPLORATORY LAPAROTOMY;  Surgeon: Dia Crawford III, MD;  Location: [facility] ORS;  Service: General;  Laterality: N/A;  . PORTA CATH INSERTION N/A 04/10/2019   Procedure: PORTA CATH INSERTION;  Surgeon: Katha Cabal, MD;  Location: [place] [facility];  Service: Cardiovascular;  Laterality: N/A;    SOCIAL HISTORY: Social History   Socioeconomic History  . Marital status: Single    Spouse name: Not on file  . Number of children: Not on file  . Years of education: Not on file  . Highest education level: Not on file  Occupational History  . Occupation: retired  Tobacco Use  . Smoking status: Former Smoker    Packs/day: 0.25    Years: 50.00    Pack years: 12.50  . Smokeless tobacco: Never Used  . Tobacco comment: 1-2/week  Vaping Use  . Vaping Use: Never used  Substance and Sexual Activity  . Alcohol use: Not Currently    Alcohol/week: 35.0 standard drinks    Types: 30 Cans of beer, 5 Shots of liquor per week  . Drug use: No  . Sexual activity: Not on file  Other Topics Concern  . Not on file  Social History Narrative  . Not on file   Social Determinants of Health   Financial Resource Strain:   . Difficulty of Paying Living Expenses:   Food Insecurity:   . Worried About Running  Out of Food in the Last Year:   . [place] in the Last Year:   Transportation Needs:   . Lack of Transportation (Medical):   Marland Kitchen Lack of Transportation (Non-Medical):   Physical Activity:   . Days of Exercise per Week:   . Minutes of Exercise per Session:   Stress:   . Feeling of Stress :   Social Connections:   . Frequency of Communication with Friends and Family:   . Frequency of Social Gatherings with Friends and Family:   . Attends Religious Services:   . Active Member of Clubs or Organizations:   . Attends Archivist Meetings:   Marland Kitchen Marital  Status:   Intimate Partner Violence:   . Fear of Current or Ex-Partner:   . Emotionally Abused:   Marland Kitchen Physically Abused:   . Sexually Abused:     FAMILY HISTORY: Family History  Problem Relation Age of Onset  . Cancer Mother   . Cancer Father     ALLERGIES:  is allergic to 5-alpha reductase inhibitors.  MEDICATIONS:  Current Outpatient Medications  Medication Sig Dispense Refill  . dexamethasone (DECADRON) 4 MG tablet Take 2 tablets (8 mg total) by mouth daily. Start the day after carboplatin chemotherapy for 3 days. (Patient not taking: Reported on 07/24/2019) 30 tablet 1  . fentaNYL (DURAGESIC) 25 MCG/HR Place 1 patch onto the skin every 3 (three) days. 10 patch 0  . lidocaine-prilocaine (EMLA) cream Apply to affected area once (Patient taking differently: Apply 1 application topically daily as needed (port acess). ) 30 g 3  . loperamide (IMODIUM) 2 MG capsule Take 1 capsule (2 mg total) by mouth See admin instructions. With onset of loose stool, take '4mg'$  followed by '2mg'$  every 2 hours until 12 hours have passed without loose bowel movement. Maximum: 16 mg/day (Patient not taking: Reported on 07/18/2019) 60 capsule 1  . megestrol (MEGACE) 40 MG tablet Take 1 tablet (40 mg total) by mouth 2 (two) times daily. 60 tablet 0  . Multiple Vitamin (MULTIVITAMIN) capsule Take 1 capsule by mouth daily.    Marland Kitchen NARCAN 4 MG/0.1ML LIQD nasal spray kit 1 spray once.    . ondansetron (ZOFRAN) 8 MG tablet Take 1 tablet (8 mg total) by mouth 2 (two) times daily as needed for refractory nausea / vomiting. Start on day 3 after carboplatin chemo. 30 tablet 1  . oxyCODONE (ROXICODONE) 5 MG immediate release tablet Take 1 tablet (5 mg total) by mouth every 8 (eight) hours as needed for severe pain or breakthrough pain. 60 tablet 0  . polyethylene glycol (MIRALAX / GLYCOLAX) 17 g packet Take 17 g by mouth daily. (Patient not taking: Reported on 07/24/2019) 14 each 0  . potassium chloride SA (KLOR-CON M20) 20 MEQ tablet  Take 1 tablet (20 mEq total) by mouth daily. 30 tablet 0  . prochlorperazine (COMPAZINE) 10 MG tablet Take 1 tablet (10 mg total) by mouth every 6 (six) hours as needed (Nausea or vomiting). 30 tablet 1  . senna (SENOKOT) 8.6 MG TABS tablet Take 1 tablet (8.6 mg total) by mouth daily. (Patient not taking: Reported on 07/24/2019) 120 tablet 0   No current facility-administered medications for this visit.   Facility-Administered Medications Ordered in Other Visits  Medication Dose Route Frequency Provider Last Rate Last Admin  . heparin lock flush 100 unit/mL  500 Units Intravenous Once Earlie Server, MD         PHYSICAL EXAMINATION: ECOG PERFORMANCE STATUS: 1 -  Symptomatic but completely ambulatory Vitals:   08/08/19 0845  BP: (!) 135/98  Pulse: (!) 110  Resp: 18  Temp: 98 F (36.7 C)  SpO2: 100%   Filed Weights   08/08/19 0845  Weight: 132 lb 4.8 oz (60 kg)    Physical Exam Constitutional:      General: He is not in acute distress.    Appearance: He is not ill-appearing.  HENT:     Head: Normocephalic and atraumatic.  Eyes:     General: No scleral icterus. Cardiovascular:     Rate and Rhythm: Regular rhythm. Tachycardia present.     Heart sounds: Normal heart sounds.  Pulmonary:     Effort: Pulmonary effort is normal. No respiratory distress.     Breath sounds: No wheezing or rales.  Abdominal:     General: Bowel sounds are normal. There is no distension.     Palpations: Abdomen is soft.  Musculoskeletal:        General: No deformity. Normal range of motion.     Cervical back: Normal range of motion and neck supple.  Skin:    General: Skin is warm and dry.     Findings: No erythema or rash.  Neurological:     Mental Status: He is alert and oriented to person, place, and time. Mental status is at baseline.     Cranial Nerves: No cranial nerve deficit.     Coordination: Coordination normal.  Psychiatric:        Mood and Affect: Mood normal.     LABORATORY DATA:  I  have reviewed the data as listed Lab Results  Component Value Date   WBC 2.6 (L) 07/24/2019   HGB 11.1 (L) 07/24/2019   HCT 32.3 (L) 07/24/2019   MCV 84.3 07/24/2019   PLT 338 07/24/2019   Recent Labs    07/11/19 0801 07/18/19 0757 07/24/19 1243  NA 137 139 136  K 2.7* 3.6 3.7  CL 102 103 101  CO2 '27 27 25  '$ GLUCOSE 125* 136* 173*  BUN 6* 5* 16  CREATININE 0.57* 0.52* 0.59*  CALCIUM 8.5* 8.8* 9.2  GFRNONAA >60 >60 >60  GFRAA >60 >60 >60  PROT 7.1 7.1 7.0  ALBUMIN 3.0* 2.9* 3.1*  AST 32 31 55*  ALT 27 22 54*  ALKPHOS 64 65 62  BILITOT 0.8 0.7 0.8   Iron/TIBC/Ferritin/ %Sat No results found for: IRON, TIBC, FERRITIN, IRONPCTSAT   RADIOGRAPHIC STUDIES: I have personally reviewed the radiological images as listed and agreed with the findings in the report. CT Chest W Contrast  Result Date: 07/04/2019 CLINICAL DATA:  Stage IV adenocarcinoma of the lung. EXAM: CT CHEST, ABDOMEN, AND PELVIS WITH CONTRAST TECHNIQUE: Multidetector CT imaging of the chest, abdomen and pelvis was performed following the standard protocol during bolus administration of intravenous contrast. CONTRAST:  78m OMNIPAQUE IOHEXOL 300 MG/ML  SOLN COMPARISON:  03/26/2019 FINDINGS: CT CHEST FINDINGS Cardiovascular: The heart size is normal. No substantial pericardial effusion. Coronary artery calcification is evident. Atherosclerotic calcification is noted in the wall of the thoracic aorta. Ascending thoracic aorta measures 4.1 cm diameter. Right-sided Port-A-Cath is looped in the right jugular vein with the tip directed centrally at the innominate vein confluence. Mediastinum/Nodes: No mediastinal lymphadenopathy. There is no hilar lymphadenopathy. There is no axillary lymphadenopathy. The esophagus has normal imaging features. Lungs/Pleura: Centrilobular and paraseptal emphysema evident. Bullous change noted in the apices, left greater than right. Architectural distortion with patchy ill-defined consolidative  opacity in the anterior left  upper lobe is similar to prior and compatible with sequelae of prior therapy. No new suspicious pulmonary nodule or mass. No pleural effusion. Musculoskeletal: Lucent lesion in the T11 vertebral body is stable. Small focus of left paraspinal soft tissue at T11 is unchanged. CT ABDOMEN PELVIS FINDINGS Hepatobiliary: Central liver mass (segment VIII) is stable at 2.9 x 2.8 cm today compared to 3.1 x 2.9 cm previously. Smaller immediately adjacent lesion measures 1.3 x 1.4 cm today (59/2) compared to 1.5 x 1.5 cm previously. Ill-defined hypoenhancing lesion posterior right liver (66/2) measures 1.5 x 1.5 cm today compared to 2.2 x 2.4 cm previously. A very subtle 7 mm hypoattenuating lesion in the posterior right liver identified on the previous study is not discernible on today's exam. Calcified gallstones again noted. No intrahepatic or extrahepatic biliary dilation. Pancreas: No focal mass lesion. No dilatation of the main duct. No intraparenchymal cyst. No peripancreatic edema. Spleen: No splenomegaly. No focal mass lesion. Adrenals/Urinary Tract: No adrenal nodule or mass. Small right renal cysts are stable. Left kidney unremarkable. No evidence for hydroureter. The urinary bladder appears normal for the degree of distention. Stomach/Bowel: Stomach is unremarkable. No gastric wall thickening. No evidence of outlet obstruction. Duodenum is normally positioned as is the ligament of Treitz. No small bowel wall thickening. No small bowel dilatation. The terminal ileum is normal. The appendix is not visualized, but there is no edema or inflammation in the region of the cecum. Left colon anastomosis again noted. Vascular/Lymphatic: There is abdominal aortic atherosclerosis without aneurysm. There is no gastrohepatic or hepatoduodenal ligament lymphadenopathy. No retroperitoneal or mesenteric lymphadenopathy. No pelvic sidewall lymphadenopathy. Reproductive: The prostate gland and seminal  vesicles are unremarkable. Other: No intraperitoneal free fluid. Musculoskeletal: No worrisome lytic or sclerotic osseous abnormality. IMPRESSION: 1. No new or progressive findings in the chest, abdomen, or pelvis. 2. Hepatic metastases have decreased in the interval and a very subtle 7 mm hypoattenuating lesion in the posterior right liver identified on the previous study is not discernible on today's exam. 3. Stable appearance of the lucent lesion in the T11 vertebral body with no change in the small focus of adjacent left paraspinal soft tissue attenuation. 4. The right-sided Port-A-Cath is looped in the right internal jugular vein with the tip directed centrally at the level of the innominate vein confluence. 5. Cholelithiasis. 6. Ascending thoracic aortic aneurysm at 4.1 cm diameter. Continued attention on follow-up recommended. 7. Aortic Atherosclerosis (ICD10-I70.0) and Emphysema (ICD10-J43.9). Electronically Signed   By: Misty Stanley M.D.   On: 07/04/2019 09:07   CT ANGIO CHEST PE W OR WO CONTRAST  Result Date: 07/24/2019 CLINICAL DATA:  Chest pain and shortness of breath. History of stage IV adenocarcinoma of the left lung. EXAM: CT ANGIOGRAPHY CHEST WITH CONTRAST TECHNIQUE: Multidetector CT imaging of the chest was performed using the standard protocol during bolus administration of intravenous contrast. Multiplanar CT image reconstructions and MIPs were obtained to evaluate the vascular anatomy. CONTRAST:  27m OMNIPAQUE IOHEXOL 350 MG/ML SOLN COMPARISON:  Chest CT 07/04/2019 and CT angio chest 03/26/2019 FINDINGS: Cardiovascular: The heart is normal in size. No pericardial effusion. Stable mild fusiform aneurysmal dilatation of the ascending thoracic aorta with maximum measurement of 4.1 cm. No dissection. Stable atherosclerotic calcifications at the aortic arch. Stable scattered coronary artery calcifications. The pulmonary arterial tree is well opacified. No filling defects to suggest pulmonary  embolism. Mediastinum/Nodes: No mediastinal or hilar mass or adenopathy. Small scattered lymph nodes are stable. The esophagus is grossly normal. Lungs/Pleura: Stable  advanced emphysematous changes and pulmonary scarring. Stable radiation changes involving the left upper lobe no findings for recurrent tumor. No new pulmonary nodules. No pleural effusions or pleural nodules. Upper Abdomen: Stable central right hepatic lobe lesion which measures 2.9 x 2.6 cm. Stable 13 mm lesion in segment 6 near the right kidney. No new hepatic lesions. No adrenal gland lesions. Musculoskeletal: No significant bony findings. There is a stable lucent lesion in the T11 vertebral body with smooth corticated margins. No new bone lesions. Review of the MIP images confirms the above findings. IMPRESSION: 1. No CT findings for pulmonary embolism. 2. Stable mild fusiform aneurysmal dilatation of the ascending thoracic aorta with maximum measurement of 4.1 cm. No dissection. Recommend continued observation. 3. Stable radiation changes involving the left upper lobe. No findings for recurrent tumor. 4. Stable advanced emphysematous changes and pulmonary scarring. No new pulmonary nodules. 5. Stable hepatic lesions.  No new or progressive findings. 6. Emphysema and aortic atherosclerosis. Aortic Atherosclerosis (ICD10-I70.0) and Emphysema (ICD10-J43.9). Electronically Signed   By: Marijo Sanes M.D.   On: 07/24/2019 15:45   CT Abdomen Pelvis W Contrast  Result Date: 07/04/2019 CLINICAL DATA:  Stage IV adenocarcinoma of the lung. EXAM: CT CHEST, ABDOMEN, AND PELVIS WITH CONTRAST TECHNIQUE: Multidetector CT imaging of the chest, abdomen and pelvis was performed following the standard protocol during bolus administration of intravenous contrast. CONTRAST:  76m OMNIPAQUE IOHEXOL 300 MG/ML  SOLN COMPARISON:  03/26/2019 FINDINGS: CT CHEST FINDINGS Cardiovascular: The heart size is normal. No substantial pericardial effusion. Coronary artery  calcification is evident. Atherosclerotic calcification is noted in the wall of the thoracic aorta. Ascending thoracic aorta measures 4.1 cm diameter. Right-sided Port-A-Cath is looped in the right jugular vein with the tip directed centrally at the innominate vein confluence. Mediastinum/Nodes: No mediastinal lymphadenopathy. There is no hilar lymphadenopathy. There is no axillary lymphadenopathy. The esophagus has normal imaging features. Lungs/Pleura: Centrilobular and paraseptal emphysema evident. Bullous change noted in the apices, left greater than right. Architectural distortion with patchy ill-defined consolidative opacity in the anterior left upper lobe is similar to prior and compatible with sequelae of prior therapy. No new suspicious pulmonary nodule or mass. No pleural effusion. Musculoskeletal: Lucent lesion in the T11 vertebral body is stable. Small focus of left paraspinal soft tissue at T11 is unchanged. CT ABDOMEN PELVIS FINDINGS Hepatobiliary: Central liver mass (segment VIII) is stable at 2.9 x 2.8 cm today compared to 3.1 x 2.9 cm previously. Smaller immediately adjacent lesion measures 1.3 x 1.4 cm today (59/2) compared to 1.5 x 1.5 cm previously. Ill-defined hypoenhancing lesion posterior right liver (66/2) measures 1.5 x 1.5 cm today compared to 2.2 x 2.4 cm previously. A very subtle 7 mm hypoattenuating lesion in the posterior right liver identified on the previous study is not discernible on today's exam. Calcified gallstones again noted. No intrahepatic or extrahepatic biliary dilation. Pancreas: No focal mass lesion. No dilatation of the main duct. No intraparenchymal cyst. No peripancreatic edema. Spleen: No splenomegaly. No focal mass lesion. Adrenals/Urinary Tract: No adrenal nodule or mass. Small right renal cysts are stable. Left kidney unremarkable. No evidence for hydroureter. The urinary bladder appears normal for the degree of distention. Stomach/Bowel: Stomach is unremarkable.  No gastric wall thickening. No evidence of outlet obstruction. Duodenum is normally positioned as is the ligament of Treitz. No small bowel wall thickening. No small bowel dilatation. The terminal ileum is normal. The appendix is not visualized, but there is no edema or inflammation in the region of  the cecum. Left colon anastomosis again noted. Vascular/Lymphatic: There is abdominal aortic atherosclerosis without aneurysm. There is no gastrohepatic or hepatoduodenal ligament lymphadenopathy. No retroperitoneal or mesenteric lymphadenopathy. No pelvic sidewall lymphadenopathy. Reproductive: The prostate gland and seminal vesicles are unremarkable. Other: No intraperitoneal free fluid. Musculoskeletal: No worrisome lytic or sclerotic osseous abnormality. IMPRESSION: 1. No new or progressive findings in the chest, abdomen, or pelvis. 2. Hepatic metastases have decreased in the interval and a very subtle 7 mm hypoattenuating lesion in the posterior right liver identified on the previous study is not discernible on today's exam. 3. Stable appearance of the lucent lesion in the T11 vertebral body with no change in the small focus of adjacent left paraspinal soft tissue attenuation. 4. The right-sided Port-A-Cath is looped in the right internal jugular vein with the tip directed centrally at the level of the innominate vein confluence. 5. Cholelithiasis. 6. Ascending thoracic aortic aneurysm at 4.1 cm diameter. Continued attention on follow-up recommended. 7. Aortic Atherosclerosis (ICD10-I70.0) and Emphysema (ICD10-J43.9). Electronically Signed   By: Misty Stanley M.D.   On: 07/04/2019 09:07   US Venous Img Lower Bilateral (DVT)  Result Date: 08/02/2019 CLINICAL DATA:  Bilateral lower extremity pain and edema for the past 2 weeks. History of smoking. History of lung cancer. Evaluate for DVT. EXAM: BILATERAL LOWER EXTREMITY VENOUS DOPPLER ULTRASOUND TECHNIQUE: Gray-scale sonography with graded compression, as well as  color Doppler and duplex ultrasound were performed to evaluate the lower extremity deep venous systems from the level of the common femoral vein and including the common femoral, femoral, profunda femoral, popliteal and calf veins including the posterior tibial, peroneal and gastrocnemius veins when visible. The superficial great saphenous vein was also interrogated. Spectral Doppler was utilized to evaluate flow at rest and with distal augmentation maneuvers in the common femoral, femoral and popliteal veins. COMPARISON:  None. FINDINGS: RIGHT LOWER EXTREMITY Common Femoral Vein: No evidence of thrombus. Normal compressibility, respiratory phasicity and response to augmentation. Saphenofemoral Junction: No evidence of thrombus. Normal compressibility and flow on color Doppler imaging. Profunda Femoral Vein: No evidence of thrombus. Normal compressibility and flow on color Doppler imaging. Femoral Vein: No evidence of thrombus. Normal compressibility, respiratory phasicity and response to augmentation. Popliteal Vein: No evidence of thrombus. Normal compressibility, respiratory phasicity and response to augmentation. Calf Veins: No evidence of thrombus. Normal compressibility and flow on color Doppler imaging. Superficial Great Saphenous Vein: No evidence of thrombus. Normal compressibility. Venous Reflux:  None. Other Findings:  None. LEFT LOWER EXTREMITY Common Femoral Vein: No evidence of thrombus. Normal compressibility, respiratory phasicity and response to augmentation. Saphenofemoral Junction: No evidence of thrombus. Normal compressibility and flow on color Doppler imaging. Profunda Femoral Vein: No evidence of thrombus. Normal compressibility and flow on color Doppler imaging. Femoral Vein: No evidence of thrombus. Normal compressibility, respiratory phasicity and response to augmentation. Popliteal Vein: No evidence of thrombus. Normal compressibility, respiratory phasicity and response to augmentation.  Calf Veins: No evidence of thrombus. Normal compressibility and flow on color Doppler imaging. Superficial Great Saphenous Vein: No evidence of thrombus. Normal compressibility. Venous Reflux:  None. Other Findings:  None. IMPRESSION: No evidence of DVT within either lower extremity. Electronically Signed   By: Sandi Mariscal M.D.   On: 08/02/2019 10:36   DG Abdomen Acute W/Chest  Result Date: 05/14/2019 CLINICAL DATA:  Abdominal distension and constipation EXAM: DG ABDOMEN ACUTE W/ 1V CHEST COMPARISON:  03/26/2019 CT FINDINGS: Normal heart size and stable mediastinal contours. Post treatment scarring in the left upper  lobe. There is no edema, consolidation, effusion, or pneumothorax. Porta catheter with tip at the upper SVC. Moderate stool volume seen along the flanks. No worrisome bowel dilatation. No concerning mass effect or gas collection. Lumbar spine degeneration IMPRESSION: Nonobstructive bowel gas pattern with moderate stool volume. Electronically Signed   By: Monte Fantasia M.D.   On: 05/14/2019 07:16      ASSESSMENT & PLAN:  1. Stage IV adenocarcinoma of lung, left ([place])   2. Hepatocellular carcinoma metastatic to left lung ([place])   3. Encounter for antineoplastic chemotherapy   4. Tachycardia    #Stage IV Lung adenocarcinoma, Stage IV HCC-07/17/2018 paraspinal soft tissue biopsy Patient is on palliative chemotherapy with carboplatin, Taxol, bevacizumab, Tecentriq. Hold treatment due to tachycardia and severe electrolyte imbalance today.  #Hypotension has improved after amlodipine was discontinued. Chest tightness has improved.  Recent CT chest PE protocol showed no embolism, Comparing to last CT scan in May, stable disease.  #Tachycardia, etiology unknown.  No clinical signs of dehydration and patient reports tolerated p.o. and has good oral hydration. EKG was done in the clinic which showed sinus tachycardia with premature atrial complex. I referred patient to establish care with  cardiology for further evaluation.  #Severe hypokalemia, advised patient to resume potassium chloride 20 mEq daily. She will receive  #Weight loss, likely due to decreased oral intake. He has started on megace '40mg'$  BID. No improved of his appetite yet.    #Patient has been on fentanyl patch and oxycodone for neoplasm related pain.  Currently his cancer is stable and I do not think bilateral hand and feet pain are related to his cancer.  I discussed with patient that he has been using oxycodone more frequently than advised. Suggest patient to try gabapentin 300 mg 1-2 times daily and see if that helps his pain.  Patient to follow-up with palliative care for further management. . We spent sufficient time to discuss many aspect of care, questions were answered to patient's satisfaction. All questions were answered. The patient knows to call the clinic with any problems questions or concerns.   Earlie Server, MD, PhD 08/08/2019

## 2019-08-08 NOTE — Progress Notes (Signed)
Patient here for oncology follow-up appointment, expresses concerns of foot pain.

## 2019-08-10 ENCOUNTER — Encounter: Payer: Self-pay | Admitting: Oncology

## 2019-08-13 ENCOUNTER — Other Ambulatory Visit: Payer: Self-pay

## 2019-08-13 ENCOUNTER — Ambulatory Visit (INDEPENDENT_AMBULATORY_CARE_PROVIDER_SITE_OTHER): Payer: Medicare Other | Admitting: Cardiology

## 2019-08-13 ENCOUNTER — Encounter: Payer: Self-pay | Admitting: Cardiology

## 2019-08-13 VITALS — BP 120/90 | HR 95 | Ht 65.0 in | Wt 135.1 lb

## 2019-08-13 DIAGNOSIS — I10 Essential (primary) hypertension: Secondary | ICD-10-CM | POA: Diagnosis not present

## 2019-08-13 DIAGNOSIS — R0602 Shortness of breath: Secondary | ICD-10-CM | POA: Diagnosis not present

## 2019-08-13 DIAGNOSIS — I491 Atrial premature depolarization: Secondary | ICD-10-CM | POA: Diagnosis not present

## 2019-08-13 NOTE — Progress Notes (Signed)
Cardiology Office Note:    Date:  08/13/2019   ID:  Samuel Aho., DOB [birth year], MRN [id number]  PCP:  Jerrol Banana., MD  [facility] Cardiologist:  Kate Sable, MD  [place] Electrophysiologist:  None   Referring MD: Earlie Server, MD   Chief Complaint  Patient presents with  . OTHER    Tachycardia c/o elevated BP. Meds reviewed verbally with pt.   Samuel Meckes. is a 69 y.o. male who is being seen today for the evaluation of tachycardia at the request of Earlie Server, MD.   History of Present Illness:    Samuel Barrell. is a 69 y.o. male with a hx of hypertension, former smoker x50 years, stage IV lung cancer who presents due to tachycardia.  Patient with metastatic lung cancer, has been on chemotherapy for about 3 months now.  He had an appointment with oncologist where heart rate was noted to be elevated.  EKG showed sinus tachycardia with PACs otherwise normal.  He has a history of hypertension, previously on Norvasc, he had episodes of low blood pressure and blood pressure medications were stopped.  Recent outpatient visit showed systolic blood pressure in the 130s.  He denies any history of heart disease.  He states getting out of breath when he overexerts himself.  Past Medical History:  Diagnosis Date  . Hepatocellular carcinoma metastatic to left lung ([place]) 07/24/2018  . History of angiography    left lower extremity  . Hypertension   . Small bowel obstruction [facility])     Past Surgical History:  Procedure Laterality Date  . COLON SURGERY    . COLONOSCOPY W/ POLYPECTOMY    . COLONOSCOPY WITH PROPOFOL N/A 01/31/2018   Procedure: COLONOSCOPY WITH PROPOFOL;  Surgeon: Tol[place], Benay Pike, MD;  Location: [facility] ENDOSCOPY;  Service: Gastroenterology;  Laterality: N/A;  . debridement fasciotomy leg, left Left 03/19/2016  . LAPAROTOMY N/A 09/27/2014   Procedure: EXPLORATORY LAPAROTOMY;  Surgeon: Dia Crawford III, MD;  Location: [facility] ORS;  Service: General;   Laterality: N/A;  . PORTA CATH INSERTION N/A 04/10/2019   Procedure: PORTA CATH INSERTION;  Surgeon: Katha Cabal, MD;  Location: [place] CV LAB;  Service: Cardiovascular;  Laterality: N/A;    Current Medications: Current Meds  Medication Sig  . dexamethasone (DECADRON) 4 MG tablet Take 2 tablets (8 mg total) by mouth daily. Start the day after carboplatin chemotherapy for 3 days.  . fentaNYL (DURAGESIC) 25 MCG/HR Place 1 patch onto the skin every 3 (three) days.  Marland Kitchen gabapentin (NEURONTIN) 300 MG capsule Take 1 capsule (300 mg total) by mouth 2 (two) times daily.  Marland Kitchen lidocaine-prilocaine (EMLA) cream Apply to affected area once (Patient taking differently: Apply 1 application topically daily as needed (port acess). )  . loperamide (IMODIUM) 2 MG capsule Take 1 capsule (2 mg total) by mouth See admin instructions. With onset of loose stool, take '4mg'$  followed by '2mg'$  every 2 hours until 12 hours have passed without loose bowel movement. Maximum: 16 mg/day  . megestrol (MEGACE) 40 MG tablet Take 1 tablet (40 mg total) by mouth 2 (two) times daily.  . Multiple Vitamin (MULTIVITAMIN) capsule Take 1 capsule by mouth daily.  Marland Kitchen NARCAN 4 MG/0.1ML LIQD nasal spray kit 1 spray once.   . ondansetron (ZOFRAN) 8 MG tablet Take 1 tablet (8 mg total) by mouth 2 (two) times daily as needed for refractory nausea / vomiting. Start on day 3 after carboplatin chemo.  Marland Kitchen  oxyCODONE (ROXICODONE) 5 MG immediate release tablet Take 1 tablet (5 mg total) by mouth every 8 (eight) hours as needed for severe pain or breakthrough pain.  . polyethylene glycol (MIRALAX / GLYCOLAX) 17 g packet Take 17 g by mouth daily.  . potassium chloride SA (KLOR-CON M20) 20 MEQ tablet Take 1 tablet (20 mEq total) by mouth daily.  . prochlorperazine (COMPAZINE) 10 MG tablet Take 1 tablet (10 mg total) by mouth every 6 (six) hours as needed (Nausea or vomiting).  Marland Kitchen senna (SENOKOT) 8.6 MG TABS tablet Take 1 tablet (8.6 mg total) by mouth  daily.     Allergies:   5-alpha reductase inhibitors   Social History   Socioeconomic History  . Marital status: Single    Spouse name: Not on file  . Number of children: Not on file  . Years of education: Not on file  . Highest education level: Not on file  Occupational History  . Occupation: retired  Tobacco Use  . Smoking status: Former Smoker    Packs/day: 0.25    Years: 50.00    Pack years: 12.50  . Smokeless tobacco: Never Used  . Tobacco comment: 1-2/week  Vaping Use  . Vaping Use: Never used  Substance and Sexual Activity  . Alcohol use: Not Currently    Alcohol/week: 35.0 standard drinks    Types: 30 Cans of beer, 5 Shots of liquor per week  . Drug use: No  . Sexual activity: Not on file  Other Topics Concern  . Not on file  Social History Narrative  . Not on file   Social Determinants of Health   Financial Resource Strain:   . Difficulty of Paying Living Expenses:   Food Insecurity:   . Worried About Charity fundraiser in the Last Year:   . Arboriculturist in the Last Year:   Transportation Needs:   . Film/video editor (Medical):   Marland Kitchen Lack of Transportation (Non-Medical):   Physical Activity:   . Days of Exercise per Week:   . Minutes of Exercise per Session:   Stress:   . Feeling of Stress :   Social Connections:   . Frequency of Communication with Friends and Family:   . Frequency of Social Gatherings with Friends and Family:   . Attends Religious Services:   . Active Member of Clubs or Organizations:   . Attends Archivist Meetings:   Marland Kitchen Marital Status:      Family History: The patient's family history includes Cancer in his father and mother.  ROS:   Please see the history of present illness.     All other systems reviewed and are negative.  EKGs/Labs/Other Studies Reviewed:    The following studies were reviewed today:   EKG:  EKG is  ordered today.  The ekg ordered today demonstrates normal sinus rhythm, possible old  septal infarct.  Recent Labs: 08/08/2019: ALT 40; BUN 8; Creatinine, Ser 0.52; Hemoglobin 11.5; Magnesium 1.7; Platelets 306; Potassium 2.8; Sodium 141; TSH 1.770  Recent Lipid Panel    Component Value Date/Time   CHOL 108 06/23/2018 1100   TRIG 56 06/23/2018 1100   HDL 43 06/23/2018 1100   CHOLHDL 2.5 06/23/2018 1100   LDLCALC 54 06/23/2018 1100    Physical Exam:    VS:  BP 120/90 (BP Location: Right Arm, Patient Position: Sitting, Cuff Size: Normal)   Pulse 95   Ht '5\' 5"'$  (1.651 m)   Wt 135 lb 2 oz (  61.3 kg)   SpO2 97%   BMI 22.49 kg/m     Wt Readings from Last 3 Encounters:  08/13/19 135 lb 2 oz (61.3 kg)  08/08/19 132 lb 4.8 oz (60 kg)  07/31/19 133 lb 3.2 oz (60.4 kg)     GEN:  Well nourished, well developed in no acute distress HEENT: Normal NECK: No JVD; No carotid bruits LYMPHATICS: No lymphadenopathy CARDIAC: RRR, no murmurs, rubs, gallops RESPIRATORY: Decreased breath sounds ABDOMEN: Soft, non-tender, non-distended MUSCULOSKELETAL:  No edema; No deformity  SKIN: Warm and dry NEUROLOGIC:  Alert and oriented x 3 PSYCHIATRIC:  Normal affect   ASSESSMENT:    1. SOB (shortness of breath)   2. PAC (premature atrial contraction)   3. Essential hypertension    PLAN:    In order of problems listed above:  1. Patient with shortness of breath with exertion.  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  We will get an echocardiogram to evaluate overall cardiac systolic and diastolic function.  Symptoms of shortness of breath.  Due to emphysema as a result of long history of smoking. 2. History of sinus tachycardia and occasional PAC on prior ECG.  EKG in the office today shows normal sinus rhythm.  Patient denies palpitations.  No indication for beta-blocker at this time. 3. History of hypertension, blood pressure controlled.  I agree with holding amlodipine.  Systolic blood pressure of up to 135 mmHg I think is reasonable in this  patient given his overall condition.  Blood pressure to be is normal.  He has a history of hypotension.  Continue to monitor for now.   Follow-up after echocardiogram.  Medication Adjustments/Labs and Tests Ordered: Current medicines are reviewed at length with the patient today.  Concerns regarding medicines are outlined above.  Orders Placed This Encounter  Procedures  . EKG 12-Lead   No orders of the defined types were placed in this encounter.   Patient Instructions  Medication Instructions:   Your physician recommends that you continue on your current medications as directed. Please refer to the Current Medication list given to you today.   *If you need a refill on your cardiac medications before your next appointment, please call your pharmacy*   Lab Work: None Ordered If you have labs (blood work) drawn today and your tests are completely normal, you will receive your results only by: Marland Kitchen MyChart Message (if you have MyChart) OR . A paper copy in the mail If you have any lab test that is abnormal or we need to change your treatment, we will call you to review the results.   Testing/Procedures:  Your physician has requested that you have an echocardiogram. Echocardiography is a painless test that uses sound waves to create images of your heart. It provides your doctor with information about the size and shape of your heart and how well your heart's chambers and valves are working. This procedure takes approximately one hour. There are no restrictions for this procedure.    Follow-Up: At [facility], you and your health needs are our priority.  As part of our continuing mission to provide you with exceptional heart care, we have created designated Provider Care Teams.  These Care Teams include your primary Cardiologist (physician) and Advanced Practice Providers (APPs -  Physician Assistants and Nurse Practitioners) who all work together to provide you with the care you  need, when you need it.  We recommend signing up for the patient portal called "  MyChart".  Sign up information is provided on this After Visit Summary.  MyChart is used to connect with patients for Virtual Visits (Telemedicine).  Patients are able to view lab/test results, encounter notes, upcoming appointments, etc.  Non-urgent messages can be sent to your provider as well.   To learn more about what you can do with MyChart, go to [email or web address].    Your next appointment:   Follow up after echo   The format for your next appointment:   In Person  Provider:   Debbe Odea, MD   Other Instructions   Echocardiogram An echocardiogram is a procedure that uses painless sound waves (ultrasound) to produce an image of the heart. Images from an echocardiogram can provide important information about:  Signs of coronary artery disease (CAD).  Aneurysm detection. An aneurysm is a weak or damaged part of an artery wall that bulges out from the normal force of blood pumping through the body.  Heart size and shape. Changes in the size or shape of the heart can be associated with certain conditions, including heart failure, aneurysm, and CAD.  Heart muscle function.  Heart valve function.  Signs of a past heart attack.  Fluid buildup around the heart.  Thickening of the heart muscle.  A tumor or infectious growth around the heart valves. Tell a health care provider about:  Any allergies you have.  All medicines you are taking, including vitamins, herbs, eye drops, creams, and over-the-counter medicines.  Any blood disorders you have.  Any surgeries you have had.  Any medical conditions you have.  Whether you are pregnant or may be pregnant. What are the risks? Generally, this is a safe procedure. However, problems may occur, including:  Allergic reaction to dye (contrast) that may be used during the procedure. What happens before the procedure? No specific  preparation is needed. You may eat and drink normally. What happens during the procedure?   An IV tube may be inserted into one of your veins.  You may receive contrast through this tube. A contrast is an injection that improves the quality of the pictures from your heart.  A gel will be applied to your chest.  A wand-like tool (transducer) will be moved over your chest. The gel will help to transmit the sound waves from the transducer.  The sound waves will harmlessly bounce off of your heart to allow the heart images to be captured in real-time motion. The images will be recorded on a computer. The procedure may vary among health care providers and hospitals. What happens after the procedure?  You may return to your normal, everyday life, including diet, activities, and medicines, unless your health care provider tells you not to do that. Summary  An echocardiogram is a procedure that uses painless sound waves (ultrasound) to produce an image of the heart.  Images from an echocardiogram can provide important information about the size and shape of your heart, heart muscle function, heart valve function, and fluid buildup around your heart.  You do not need to do anything to prepare before this procedure. You may eat and drink normally.  After the echocardiogram is completed, you may return to your normal, everyday life, unless your health care provider tells you not to do that. This information is not intended to replace advice given to you by your health care provider. Make sure you discuss any questions you have with your health care provider. Document Revised: 05/25/2018 Document Reviewed: 03/06/2016 Elsevier Patient Education  2020  [place], Kate Sable, MD  08/13/2019 1:15 PM    [facility]

## 2019-08-13 NOTE — Patient Instructions (Signed)
Medication Instructions:   Your physician recommends that you continue on your current medications as directed. Please refer to the Current Medication list given to you today.   *If you need a refill on your cardiac medications before your next appointment, please call your pharmacy*   Lab Work: None Ordered If you have labs (blood work) drawn today and your tests are completely normal, you will receive your results only by: Marland Kitchen MyChart Message (if you have MyChart) OR . A paper copy in the mail If you have any lab test that is abnormal or we need to change your treatment, we will call you to review the results.   Testing/Procedures:  Your physician has requested that you have an echocardiogram. Echocardiography is a painless test that uses sound waves to create images of your heart. It provides your doctor with information about the size and shape of your heart and how well your heart's chambers and valves are working. This procedure takes approximately one hour. There are no restrictions for this procedure.    Follow-Up: At [facility], you and your health needs are our priority.  As part of our continuing mission to provide you with exceptional heart care, we have created designated Provider Care Teams.  These Care Teams include your primary Cardiologist (physician) and Advanced Practice Providers (APPs -  Physician Assistants and Nurse Practitioners) who all work together to provide you with the care you need, when you need it.  We recommend signing up for the patient portal called "MyChart".  Sign up information is provided on this After Visit Summary.  MyChart is used to connect with patients for Virtual Visits (Telemedicine).  Patients are able to view lab/test results, encounter notes, upcoming appointments, etc.  Non-urgent messages can be sent to your provider as well.   To learn more about what you can do with MyChart, go to [email or web address].    Your next appointment:     Follow up after echo   The format for your next appointment:   In Person  Provider:   Kate Sable, MD   Other Instructions   Echocardiogram An echocardiogram is a procedure that uses painless sound waves (ultrasound) to produce an image of the heart. Images from an echocardiogram can provide important information about:  Signs of coronary artery disease (CAD).  Aneurysm detection. An aneurysm is a weak or damaged part of an artery wall that bulges out from the normal force of blood pumping through the body.  Heart size and shape. Changes in the size or shape of the heart can be associated with certain conditions, including heart failure, aneurysm, and CAD.  Heart muscle function.  Heart valve function.  Signs of a past heart attack.  Fluid buildup around the heart.  Thickening of the heart muscle.  A tumor or infectious growth around the heart valves. Tell a health care provider about:  Any allergies you have.  All medicines you are taking, including vitamins, herbs, eye drops, creams, and over-the-counter medicines.  Any blood disorders you have.  Any surgeries you have had.  Any medical conditions you have.  Whether you are pregnant or may be pregnant. What are the risks? Generally, this is a safe procedure. However, problems may occur, including:  Allergic reaction to dye (contrast) that may be used during the procedure. What happens before the procedure? No specific preparation is needed. You may eat and drink normally. What happens during the procedure?   An IV tube may be inserted into one  of your veins.  You may receive contrast through this tube. A contrast is an injection that improves the quality of the pictures from your heart.  A gel will be applied to your chest.  A wand-like tool (transducer) will be moved over your chest. The gel will help to transmit the sound waves from the transducer.  The sound waves will harmlessly bounce off  of your heart to allow the heart images to be captured in real-time motion. The images will be recorded on a computer. The procedure may vary among health care providers and hospitals. What happens after the procedure?  You may return to your normal, everyday life, including diet, activities, and medicines, unless your health care provider tells you not to do that. Summary  An echocardiogram is a procedure that uses painless sound waves (ultrasound) to produce an image of the heart.  Images from an echocardiogram can provide important information about the size and shape of your heart, heart muscle function, heart valve function, and fluid buildup around your heart.  You do not need to do anything to prepare before this procedure. You may eat and drink normally.  After the echocardiogram is completed, you may return to your normal, everyday life, unless your health care provider tells you not to do that. This information is not intended to replace advice given to you by your health care provider. Make sure you discuss any questions you have with your health care provider. Document Revised: 05/25/2018 Document Reviewed: 03/06/2016 Elsevier Patient Education  [place].

## 2019-08-14 ENCOUNTER — Ambulatory Visit (INDEPENDENT_AMBULATORY_CARE_PROVIDER_SITE_OTHER): Payer: Medicare Other | Admitting: Adult Health

## 2019-08-14 ENCOUNTER — Encounter: Payer: Self-pay | Admitting: Adult Health

## 2019-08-14 VITALS — BP 122/84 | HR 108 | Temp 96.9°F | Resp 16 | Wt 135.4 lb

## 2019-08-14 DIAGNOSIS — C7802 Secondary malignant neoplasm of left lung: Secondary | ICD-10-CM | POA: Diagnosis not present

## 2019-08-14 DIAGNOSIS — E876 Hypokalemia: Secondary | ICD-10-CM

## 2019-08-14 DIAGNOSIS — M792 Neuralgia and neuritis, unspecified: Secondary | ICD-10-CM

## 2019-08-14 DIAGNOSIS — C220 Liver cell carcinoma: Secondary | ICD-10-CM

## 2019-08-14 DIAGNOSIS — C3492 Malignant neoplasm of unspecified part of left bronchus or lung: Secondary | ICD-10-CM | POA: Diagnosis not present

## 2019-08-14 DIAGNOSIS — Z9189 Other specified personal risk factors, not elsewhere classified: Secondary | ICD-10-CM | POA: Insufficient documentation

## 2019-08-14 DIAGNOSIS — R000 Tachycardia, unspecified: Secondary | ICD-10-CM | POA: Insufficient documentation

## 2019-08-14 NOTE — Patient Instructions (Signed)
Health Maintenance After Age 67 After age 67, you are at a higher risk for certain long-term diseases and infections as well as injuries from falls. Falls are a major cause of broken bones and head injuries in people who are older than age 67. Getting regular preventive care can help to keep you healthy and well. Preventive care includes getting regular testing and making lifestyle changes as recommended by your health care provider. Talk with your health care provider about:  Which screenings and tests you should have. A screening is a test that checks for a disease when you have no symptoms.  A diet and exercise plan that is right for you. What should I know about screenings and tests to prevent falls? Screening and testing are the best ways to find a health problem early. Early diagnosis and treatment give you the best chance of managing medical conditions that are common after age 67. Certain conditions and lifestyle choices may make you more likely to have a fall. Your health care provider may recommend:  Regular vision checks. Poor vision and conditions such as cataracts can make you more likely to have a fall. If you wear glasses, make sure to get your prescription updated if your vision changes.  Medicine review. Work with your health care provider to regularly review all of the medicines you are taking, including over-the-counter medicines. Ask your health care provider about any side effects that may make you more likely to have a fall. Tell your health care provider if any medicines that you take make you feel dizzy or sleepy.  Osteoporosis screening. Osteoporosis is a condition that causes the bones to get weaker. This can make the bones weak and cause them to break more easily.  Blood pressure screening. Blood pressure changes and medicines to control blood pressure can make you feel dizzy.  Strength and balance checks. Your health care provider may recommend certain tests to check your  strength and balance while standing, walking, or changing positions.  Foot health exam. Foot pain and numbness, as well as not wearing proper footwear, can make you more likely to have a fall.  Depression screening. You may be more likely to have a fall if you have a fear of falling, feel emotionally low, or feel unable to do activities that you used to do.  Alcohol use screening. Using too much alcohol can affect your balance and may make you more likely to have a fall. What actions can I take to lower my risk of falls? General instructions  Talk with your health care provider about your risks for falling. Tell your health care provider if: ? You fall. Be sure to tell your health care provider about all falls, even ones that seem minor. ? You feel dizzy, sleepy, or off-balance.  Take over-the-counter and prescription medicines only as told by your health care provider. These include any supplements.  Eat a healthy diet and maintain a healthy weight. A healthy diet includes low-fat dairy products, low-fat (lean) meats, and fiber from whole grains, beans, and lots of fruits and vegetables. Home safety  Remove any tripping hazards, such as rugs, cords, and clutter.  Install safety equipment such as grab bars in bathrooms and safety rails on stairs.  Keep rooms and walkways well-lit. Activity   Follow a regular exercise program to stay fit. This will help you maintain your balance. Ask your health care provider what types of exercise are appropriate for you.  If you need a cane or   walker, use it as recommended by your health care provider.  Wear supportive shoes that have nonskid soles. Lifestyle  Do not drink alcohol if your health care provider tells you not to drink.  If you drink alcohol, limit how much you have: ? 0-1 drink a day for women. ? 0-2 drinks a day for men.  Be aware of how much alcohol is in your drink. In the [place] one drink equals one typical bottle of beer (12  oz), one-half glass of wine (5 oz), or one shot of hard liquor (1 oz).  Do not use any products that contain nicotine or tobacco, such as cigarettes and e-cigarettes. If you need help quitting, ask your health care provider. Summary  Having a healthy lifestyle and getting preventive care can help to protect your health and wellness after age 67.  Screening and testing are the best way to find a health problem early and help you avoid having a fall. Early diagnosis and treatment give you the best chance for managing medical conditions that are more common for people who are older than age 67.  Falls are a major cause of broken bones and head injuries in people who are older than age 67. Take precautions to prevent a fall at home.  Work with your health care provider to learn what changes you can make to improve your health and wellness and to prevent falls. This information is not intended to replace advice given to you by your health care provider. Make sure you discuss any questions you have with your health care provider. Document Revised: 05/25/2018 Document Reviewed: 12/15/2016 Elsevier Patient Education  2020 [employer] Inc[employer]  

## 2019-08-14 NOTE — Progress Notes (Signed)
Established patient visit   Patient: Samuel Hood.   DOB: 1950/12/08   69 y.o. Male  MRN: [id number] Visit Date: 08/14/2019  Today's healthcare provider: Marcille Buffy, FNP   Chief Complaint  Patient presents with   Fatigue   Subjective    HPI Follow up for Fatigue  The patient was last seen for this 2 months ago. Changes made at last visit include none.Megace added for appetite stimulation. Continues current chemotherapy treatment plan at cancer center. For the last three months now.  He feels that condition is Improved.  He reports his appetite is good he is eating well he repots. He is on Megace this is helping.   He denies nausea, not using Zofran he reports. Bowel movements normal he reports.   Messaged Dr. Tasia Catchings in June had  Feet swelling negative for DVT bilaterally  Continuing to go for treatments for his cancer/hepatocellular carcinoma metastatic to the left lung.  Stage IV adenocarcinoma with Dr. Tasia Catchings, fatigue improved but persistent. Nerve pain from chemo in upper and lower extremities. He was placed on Neurontin 387m BID.   Amlodipine was discontinued due to irregularity in blood pressure.  He saw cardiology on 08/13/19 due to increased heart rate/tachycardia while at the oncologist office.  EKG was sinus tachycardia with PACs but otherwise normal.  He has had episodes of low blood pressure and blood pressure medications were stopped by oncology.  He does endorse shortness of breath when he overexerts himself otherwise no other symptoms related to cardiac or respiratory at this time-he also does have a longstanding history of emphysema as a result of long-term smoking.  He is scheduled for echocardiogram on 09/14/19. No medications given at cardiology office.. Denies any chest pain.  Cardiology agrees with holding amlodipine.  He has a follow-up with him after his echocardiogram.  Hepatis  C positive declines any further  work up or Gastroenterology referral at  this time since he is undergoing chemo.  Risks versus benefits and spread have been discussed. Patient verbalized understanding.   He has no other concerns at today's visit, history reviewed, he will be having labs done at Dr. YCollie Siadoffice for oncology.  He declines any other health maintenance work-up given his prognosis and current treatments he is undergoing.  Patient  denies any fever,,chills, rash, chest pain, shortness of breath, nausea, vomiting, or diarrhea/ constipation. Denies dizziness, lightheadedness, pre syncopal or syncopal episodes.   -----------------------------------------------------------------------------------------   Patient Active Problem List   Diagnosis Date Noted   At risk for infection due to chemotherapy 08/14/2019   Nerve pain 08/14/2019   Tachycardia 08/14/2019   Encounter for antineoplastic chemotherapy 07/24/2019   Anemia 07/12/2019   [place]or[place] [place] 06/30/2018 07/12/2019   Bone lesion 04/06/2019   Cancer, metastatic to bone ([facility] 12/16/2018   Orthostatic hypotension 10/26/2018   Screening for malignant neoplasm of prostate 10/05/2018   Stage IV adenocarcinoma of lung, left ([facility] 09/02/2018   Chronic active hepatitis ([facility] 07/28/2018   Elevated PSA 07/28/2018   Hepatocellular carcinoma metastatic to left lung ([facility] 07/24/2018   Paraspinal mass 07/24/2018   Goals of care, counseling/discussion 07/07/2018   Liver mass 06/23/2018   Lung nodule 06/23/2018   Pulmonary emphysema ([facility] 06/23/2018   Hypertension 03/26/2016   Small bowel obstruction ([facility]    Intestinal obstruction ([facility] 09/26/2014   Essential hypertension 06/19/2014   Hypokalemia 06/19/2014   Bowel obstruction ([facility] 06/18/2014   Past Medical  History:  Diagnosis Date   Hepatocellular carcinoma metastatic to left lung ([place]) 07/24/2018   History of angiography    left lower extremity   Hypertension    Small bowel  obstruction ([facility])    Social History   Tobacco Use   Smoking status: Former Smoker    Packs/day: 0.25    Years: 50.00    Pack years: 12.50   Smokeless tobacco: Never Used   Tobacco comment: 1-2/week  Vaping Use   Vaping Use: Never used  Substance Use Topics   Alcohol use: Not Currently    Alcohol/week: 35.0 standard drinks    Types: 30 Cans of beer, 5 Shots of liquor per week   Drug use: No   Allergies  Allergen Reactions   5-Alpha Reductase Inhibitors        Medications: Outpatient Medications Prior to Visit  Medication Sig   dexamethasone (DECADRON) 4 MG tablet Take 2 tablets (8 mg total) by mouth daily. Start the day after carboplatin chemotherapy for 3 days.   fentaNYL (DURAGESIC) 25 MCG/HR Place 1 patch onto the skin every 3 (three) days.   gabapentin (NEURONTIN) 300 MG capsule Take 1 capsule (300 mg total) by mouth 2 (two) times daily.   lidocaine-prilocaine (EMLA) cream Apply to affected area once (Patient taking differently: Apply 1 application topically daily as needed (port acess). )   loperamide (IMODIUM) 2 MG capsule Take 1 capsule (2 mg total) by mouth See admin instructions. With onset of loose stool, take 47m followed by 232mevery 2 hours until 12 hours have passed without loose bowel movement. Maximum: 16 mg/day   megestrol (MEGACE) 40 MG tablet Take 1 tablet (40 mg total) by mouth 2 (two) times daily.   Multiple Vitamin (MULTIVITAMIN) capsule Take 1 capsule by mouth daily.   NARCAN 4 MG/0.1ML LIQD nasal spray kit 1 spray once.    ondansetron (ZOFRAN) 8 MG tablet Take 1 tablet (8 mg total) by mouth 2 (two) times daily as needed for refractory nausea / vomiting. Start on day 3 after carboplatin chemo.   oxyCODONE (ROXICODONE) 5 MG immediate release tablet Take 1 tablet (5 mg total) by mouth every 8 (eight) hours as needed for severe pain or breakthrough pain.   polyethylene glycol (MIRALAX / GLYCOLAX) 17 g packet Take 17 g by mouth daily.    potassium chloride SA (KLOR-CON M20) 20 MEQ tablet Take 1 tablet (20 mEq total) by mouth daily.   prochlorperazine (COMPAZINE) 10 MG tablet Take 1 tablet (10 mg total) by mouth every 6 (six) hours as needed (Nausea or vomiting).   senna (SENOKOT) 8.6 MG TABS tablet Take 1 tablet (8.6 mg total) by mouth daily.   No facility-administered medications prior to visit.    Review of Systems  Constitutional: Positive for appetite change (Was decreased but he does report that is improving.) and fatigue. Negative for activity change, chills, diaphoresis, fever and unexpected weight change.  HENT: Negative.   Respiratory: Positive for shortness of breath (With exertion chronic no change.). Negative for apnea, cough, choking, chest tightness, wheezing and stridor.   Cardiovascular: Positive for leg swelling (Mild edema bilateral feet ultrasound was negative for DVT bilaterally.-This was recently performed.). Negative for chest pain and palpitations.  Gastrointestinal: Negative.   Genitourinary: Negative for difficulty urinating and flank pain.  Musculoskeletal: Positive for arthralgias.  Skin: Negative for rash.  Neurological: Negative for dizziness, light-headedness and headaches.  Psychiatric/Behavioral: Negative for sleep disturbance and suicidal ideas. The patient is not nervous/anxious.  Last CBC Lab Results  Component Value Date   WBC 7.1 08/08/2019   HGB 11.5 (L) 08/08/2019   HCT 34.9 (L) 08/08/2019   MCV 87.0 08/08/2019   MCH 28.7 08/08/2019   RDW 18.7 (H) 08/08/2019   PLT 306 38/75/6433   Last metabolic panel Lab Results  Component Value Date   GLUCOSE 112 (H) 08/08/2019   NA 141 08/08/2019   K 2.8 (L) 08/08/2019   CL 105 08/08/2019   CO2 26 08/08/2019   BUN 8 08/08/2019   CREATININE 0.52 (L) 08/08/2019   GFRNONAA >60 08/08/2019   GFRAA >60 08/08/2019   CALCIUM 8.8 (L) 08/08/2019   PHOS 4.4 09/27/2014   PROT 6.9 08/08/2019   ALBUMIN 3.2 (L) 08/08/2019   LABGLOB 3.4  06/23/2018   AGRATIO 1.2 06/23/2018   BILITOT 0.6 08/08/2019   ALKPHOS 75 08/08/2019   AST 43 (H) 08/08/2019   ALT 40 08/08/2019   ANIONGAP 10 08/08/2019   Last thyroid functions Lab Results  Component Value Date   TSH 1.770 08/08/2019   T4TOTAL 12.5 (H) 06/20/2019   Last vitamin D No results found for: 25OHVITD2, 25OHVITD3, VD25OH Last vitamin B12 and Folate No results found for: VITAMINB12, FOLATE    Objective    BP 122/84    Pulse (!) 108    Temp (!) 96.9 F (36.1 C) (Oral)    Resp 16    Wt 135 lb 6.4 oz (61.4 kg)    SpO2 100%    BMI 22.53 kg/m  BP Readings from Last 3 Encounters:  08/14/19 122/84  08/13/19 120/90  08/08/19 (!) 135/98      Physical Exam Constitutional:      General: He is not in acute distress.    Appearance: He is ill-appearing. He is not toxic-appearing or diaphoretic.  HENT:     Head: Normocephalic and atraumatic.     Mouth/Throat:     Mouth: Mucous membranes are moist.  Eyes:     General: Scleral icterus (mild bilateral ) present.     Pupils: Pupils are equal, round, and reactive to light.  Cardiovascular:     Rate and Rhythm: Regular rhythm. Tachycardia present.     Pulses:          Dorsalis pedis pulses are 2+ on the right side and 2+ on the left side.  Pulmonary:     Effort: Pulmonary effort is normal.     Breath sounds: Normal breath sounds.  Abdominal:     General: There is no distension.     Palpations: Abdomen is soft.     Tenderness: There is no abdominal tenderness.  Musculoskeletal:     Cervical back: Normal range of motion and neck supple.     Comments: 1+ edema in ankles bilaterally and also trace in bilateral feet, no warmth, drainage or skin color change.  Denies any pain.   Feet:     Right foot:     Protective Sensation: 3 sites tested. 3 sites sensed.     Left foot:     Protective Sensation: 3 sites tested. 3 sites sensed.  Lymphadenopathy:     Cervical: No cervical adenopathy.  Skin:    General: Skin is warm and  dry.  Neurological:     Mental Status: He is alert.  Psychiatric:        Mood and Affect: Mood normal.        Thought Content: Thought content normal.        Judgment: Judgment  normal.      No results found for any visits on 08/14/19.  Assessment & Plan     1. Hepatocellular carcinoma metastatic to left lung [facility]) Dr Tasia Catchings  follow-up with treatment plan and Dr. Candida Peeling oncology.  2. Stage IV adenocarcinoma of lung, left ([facility]) Continue follow-up with treatment plan with Dr. Tasia Catchings in oncology.  3. Nerve pain He was recently started on gabapentin 300 mg twice daily for nerve pain related to chemotherapy.  He feels that he can already tell a difference that maybe some of this is helping, however he knows that some of the symptoms will be persistent.  4. At risk for infection due to chemotherapy Monitor for signs of infection was discussed with patient currently has no symptoms, denies any urinary symptoms or respiratory symptoms.  5. Tachycardia CBC was normal 6 days ago, patient denies any signs of infection.  Patient also saw cardiology yesterday heart rate was 110, echocardiogram was started and amlodipine was advised to be continued held currently.  Patient was not started on a beta-blocker at discretion of cardiology. Advised patient he should go over his echocardiogram, and follow-up with cardiology after that visit as advised at a office visit.  7. Hypokalemia  Patient had labs done 6 days ago at oncology, goes again tomorrow for labs, is currently on potassium supplement, last potassium 6 days ago was 2.8, Dr. Tasia Catchings at oncology is monitoring this currently.  He will have a recheck CMP and other labs done at oncology on 08/15/2019.  He reports this provider that he has taken his potassium as prescribed.  He does have some mild edema in his lower extremities, this could have been coming from the amlodipine, however he reports that it did start mostly after chemotherapy possible need for  compression stockings and elevation of legs.  He will continue to monitor and report any new or worsening symptoms he was negative for DVT  were performed in August 02, 2019 and he has new new or changing symptoms. Flags of symptoms above were discussed with patient and patient verbalized understanding he has no further questions at this time. Return in about 3 months (around 11/14/2019), or if symptoms worsen or fail to improve, for at any time for any worsening symptoms, Go to Emergency room/ urgent care if worse.     IWellington Hampshire Flinchum, FNP, have reviewed all documentation for this visit. The documentation on 08/14/19 for the exam, diagnosis, procedures, and orders are all accurate and complete. The entirety of the information documented in the History of Present Illness, Review of Systems and Physical Exam were personally obtained by me. Portions of this information were initially documented by the CMA and reviewed by me for thoroughness and accuracy.   Marcille Buffy, [place] [phone] (phone) [phone] (fax)  [place]

## 2019-08-15 ENCOUNTER — Inpatient Hospital Stay: Payer: Medicare Other

## 2019-08-15 ENCOUNTER — Inpatient Hospital Stay (HOSPITAL_BASED_OUTPATIENT_CLINIC_OR_DEPARTMENT_OTHER): Payer: Medicare Other | Admitting: Oncology

## 2019-08-15 ENCOUNTER — Inpatient Hospital Stay (HOSPITAL_BASED_OUTPATIENT_CLINIC_OR_DEPARTMENT_OTHER): Payer: Medicare Other | Admitting: Hospice and Palliative Medicine

## 2019-08-15 ENCOUNTER — Encounter: Payer: Self-pay | Admitting: Oncology

## 2019-08-15 ENCOUNTER — Other Ambulatory Visit: Payer: Self-pay

## 2019-08-15 VITALS — BP 136/86 | HR 92 | Resp 18

## 2019-08-15 VITALS — BP 122/97 | HR 106 | Temp 96.6°F | Resp 18 | Wt 136.6 lb

## 2019-08-15 DIAGNOSIS — E876 Hypokalemia: Secondary | ICD-10-CM | POA: Diagnosis not present

## 2019-08-15 DIAGNOSIS — C3492 Malignant neoplasm of unspecified part of left bronchus or lung: Secondary | ICD-10-CM | POA: Diagnosis not present

## 2019-08-15 DIAGNOSIS — C7951 Secondary malignant neoplasm of bone: Secondary | ICD-10-CM | POA: Diagnosis not present

## 2019-08-15 DIAGNOSIS — Z5112 Encounter for antineoplastic immunotherapy: Secondary | ICD-10-CM

## 2019-08-15 DIAGNOSIS — I700 Atherosclerosis of aorta: Secondary | ICD-10-CM | POA: Diagnosis not present

## 2019-08-15 DIAGNOSIS — T451X5A Adverse effect of antineoplastic and immunosuppressive drugs, initial encounter: Secondary | ICD-10-CM

## 2019-08-15 DIAGNOSIS — G893 Neoplasm related pain (acute) (chronic): Secondary | ICD-10-CM | POA: Diagnosis not present

## 2019-08-15 DIAGNOSIS — G620 Drug-induced polyneuropathy: Secondary | ICD-10-CM | POA: Diagnosis not present

## 2019-08-15 DIAGNOSIS — Z5111 Encounter for antineoplastic chemotherapy: Secondary | ICD-10-CM

## 2019-08-15 DIAGNOSIS — R000 Tachycardia, unspecified: Secondary | ICD-10-CM

## 2019-08-15 DIAGNOSIS — C220 Liver cell carcinoma: Secondary | ICD-10-CM

## 2019-08-15 DIAGNOSIS — Z125 Encounter for screening for malignant neoplasm of prostate: Secondary | ICD-10-CM

## 2019-08-15 DIAGNOSIS — I712 Thoracic aortic aneurysm, without rupture: Secondary | ICD-10-CM | POA: Diagnosis not present

## 2019-08-15 DIAGNOSIS — R140 Abdominal distension (gaseous): Secondary | ICD-10-CM | POA: Diagnosis not present

## 2019-08-15 DIAGNOSIS — C7802 Secondary malignant neoplasm of left lung: Secondary | ICD-10-CM | POA: Diagnosis not present

## 2019-08-15 DIAGNOSIS — K769 Liver disease, unspecified: Secondary | ICD-10-CM | POA: Diagnosis not present

## 2019-08-15 DIAGNOSIS — I10 Essential (primary) hypertension: Secondary | ICD-10-CM | POA: Diagnosis not present

## 2019-08-15 DIAGNOSIS — K5900 Constipation, unspecified: Secondary | ICD-10-CM | POA: Diagnosis not present

## 2019-08-15 DIAGNOSIS — J948 Other specified pleural conditions: Secondary | ICD-10-CM | POA: Diagnosis not present

## 2019-08-15 LAB — COMPREHENSIVE METABOLIC PANEL
ALT: 35 U/L (ref 0–44)
AST: 41 U/L (ref 15–41)
Albumin: 3 g/dL — ABNORMAL LOW (ref 3.5–5.0)
Alkaline Phosphatase: 72 U/L (ref 38–126)
Anion gap: 9 (ref 5–15)
BUN: 5 mg/dL — ABNORMAL LOW (ref 8–23)
CO2: 25 mmol/L (ref 22–32)
Calcium: 8.6 mg/dL — ABNORMAL LOW (ref 8.9–10.3)
Chloride: 103 mmol/L (ref 98–111)
Creatinine, Ser: 0.53 mg/dL — ABNORMAL LOW (ref 0.61–1.24)
GFR calc Af Amer: >60 mL/min (ref >60)
GFR calc non Af Amer: >60 mL/min (ref >60)
Glucose, Bld: 131 mg/dL — ABNORMAL HIGH (ref 70–99)
Potassium: 3.7 mmol/L (ref 3.5–5.1)
Sodium: 137 mmol/L (ref 135–145)
Total Bilirubin: 0.5 mg/dL (ref 0.3–1.2)
Total Protein: 6.8 g/dL (ref 6.5–8.1)

## 2019-08-15 LAB — CBC WITH DIFFERENTIAL/PLATELET
Abs Immature Granulocytes: 0.04 10*3/uL (ref 0.00–0.07)
Basophils Absolute: 0 10*3/uL (ref 0.0–0.1)
Basophils Relative: 0 %
Eosinophils Absolute: 0.1 10*3/uL (ref 0.0–0.5)
Eosinophils Relative: 2 %
HCT: 31.8 % — ABNORMAL LOW (ref 39.0–52.0)
Hemoglobin: 10.5 g/dL — ABNORMAL LOW (ref 13.0–17.0)
Immature Granulocytes: 1 %
Lymphocytes Relative: 18 %
Lymphs Abs: 1.1 10*3/uL (ref 0.7–4.0)
MCH: 29.2 pg (ref 26.0–34.0)
MCHC: 33 g/dL (ref 30.0–36.0)
MCV: 88.3 fL (ref 80.0–100.0)
Monocytes Absolute: 0.8 10*3/uL (ref 0.1–1.0)
Monocytes Relative: 13 %
Neutro Abs: 4.2 10*3/uL (ref 1.7–7.7)
Neutrophils Relative %: 66 %
Platelets: 272 10*3/uL (ref 150–400)
RBC: 3.6 MIL/uL — ABNORMAL LOW (ref 4.22–5.81)
RDW: 19 % — ABNORMAL HIGH (ref 11.5–15.5)
WBC: 6.3 10*3/uL (ref 4.0–10.5)
nRBC: 0 % (ref 0.0–0.2)

## 2019-08-15 LAB — MAGNESIUM: Magnesium: 1.7 mg/dL (ref 1.7–2.4)

## 2019-08-15 LAB — PROTEIN, URINE, RANDOM: Total Protein, Urine: <6 mg/dL

## 2019-08-15 MED ORDER — SODIUM CHLORIDE 0.9 % IV SOLN
150.0000 mg | Freq: Once | INTRAVENOUS | Status: AC
  Administered 2019-08-15: 150 mg via INTRAVENOUS
  Filled 2019-08-15: qty 150

## 2019-08-15 MED ORDER — FAMOTIDINE IN NACL 20-0.9 MG/50ML-% IV SOLN
20.0000 mg | Freq: Once | INTRAVENOUS | Status: AC
  Administered 2019-08-15: 20 mg via INTRAVENOUS
  Filled 2019-08-15: qty 50

## 2019-08-15 MED ORDER — PALONOSETRON HCL INJECTION 0.25 MG/5ML
0.2500 mg | Freq: Once | INTRAVENOUS | Status: AC
  Administered 2019-08-15: 0.25 mg via INTRAVENOUS
  Filled 2019-08-15: qty 5

## 2019-08-15 MED ORDER — SODIUM CHLORIDE 0.9 % IV SOLN
418.9500 mg | Freq: Once | INTRAVENOUS | Status: AC
  Administered 2019-08-15: 420 mg via INTRAVENOUS
  Filled 2019-08-15: qty 42

## 2019-08-15 MED ORDER — HEPARIN SOD (PORK) LOCK FLUSH 100 UNIT/ML IV SOLN
500.0000 [IU] | Freq: Once | INTRAVENOUS | Status: AC
  Administered 2019-08-15: 500 [IU] via INTRAVENOUS
  Filled 2019-08-15: qty 5

## 2019-08-15 MED ORDER — HEPARIN SOD (PORK) LOCK FLUSH 100 UNIT/ML IV SOLN
500.0000 [IU] | Freq: Once | INTRAVENOUS | Status: DC | PRN
  Filled 2019-08-15: qty 5

## 2019-08-15 MED ORDER — SODIUM CHLORIDE 0.9 % IV SOLN
1200.0000 mg | Freq: Once | INTRAVENOUS | Status: AC
  Administered 2019-08-15: 1200 mg via INTRAVENOUS
  Filled 2019-08-15: qty 20

## 2019-08-15 MED ORDER — SODIUM CHLORIDE 0.9 % IV SOLN
15.0000 mg/kg | Freq: Once | INTRAVENOUS | Status: AC
  Administered 2019-08-15: 1000 mg via INTRAVENOUS
  Filled 2019-08-15: qty 32

## 2019-08-15 MED ORDER — DIPHENHYDRAMINE HCL 50 MG/ML IJ SOLN
50.0000 mg | Freq: Once | INTRAMUSCULAR | Status: AC
  Administered 2019-08-15: 50 mg via INTRAVENOUS
  Filled 2019-08-15: qty 1

## 2019-08-15 MED ORDER — POTASSIUM CHLORIDE ER 10 MEQ PO TBCR: 10.0000 meq | EXTENDED_RELEASE_TABLET | Freq: Every day | ORAL | 0 refills | Status: DC

## 2019-08-15 MED ORDER — SODIUM CHLORIDE 0.9 % IV SOLN
Freq: Once | INTRAVENOUS | Status: AC
  Filled 2019-08-15: qty 250

## 2019-08-15 MED ORDER — SODIUM CHLORIDE 0.9 % IV SOLN
10.0000 mg | Freq: Once | INTRAVENOUS | Status: AC
  Administered 2019-08-15: 10 mg via INTRAVENOUS
  Filled 2019-08-15: qty 10

## 2019-08-15 MED ORDER — SODIUM CHLORIDE 0.9 % IV SOLN
135.0000 mg/m2 | Freq: Once | INTRAVENOUS | Status: AC
  Administered 2019-08-15: 240 mg via INTRAVENOUS
  Filled 2019-08-15: qty 40

## 2019-08-15 MED ORDER — SODIUM CHLORIDE 0.9% FLUSH
10.0000 mL | Freq: Once | INTRAVENOUS | Status: AC
  Administered 2019-08-15: 10 mL via INTRAVENOUS
  Filled 2019-08-15: qty 10

## 2019-08-15 NOTE — Progress Notes (Addendum)
Hematology/Oncology follow up note [facility] [phone] [phone]   Patient Care Team: Samuel Banana., MD as PCP - General (Family Medicine) Kate Sable, MD as PCP - Cardiology (Cardiology) Telford Nab, RN as Registered Nurse Clent Jacks, RN as Registered Nurse  REFERRING PROVIDER: Jerrol Banana.,*  CHIEF COMPLAINTS/REASON FOR VISIT:  metastatic cancer management.  HISTORY OF PRESENTING ILLNESS:   Samuel Cerreta. is a  69 y.o.  male with PMH listed below was seen in consultation at the request of  Samuel Banana.,*  for evaluation of lung nodule in the liver lesion. Patient has a past medical history of hypertension, alcohol abuse, smoking emphysema.  He has had serial CTs done in the past.  CT images were independently reviewed by me. 07/26/2017 CT chest with contrast showed extensive pleural-parenchymal scarring within the left upper lobe with several areas of soft tissue nodularity measuring up to 1.6 cm.  In this patient who is at increased risk of lung cancer further investigation with PET scan is recommended. Small chronic appearance loculated hydropneumothorax overlies the left apex. Slowly enlarging lesion within the central portion of the right lobe of liver identified. Per note, patient supposed to have additional work-up done in December repeat CT scan which he did not  Patient was recently seen by primary care provider and has subsequent image done for follow-up. CT on 06/30/2018 showed multiple significantly enlarged paraspinal mass are noted concerning.  The largest measures 3.8 x 0.5 cm and there appears to be a lytic destruction of the adjacent T11 vertebral body.  Also noted is new solid density measuring 17 x 12 mm in the pleural parenchymal density in the left upper lobe noted on prior exam.  Concerning for malignancy.  4.4 ascending thoracic aortic aneurysm.  Recommend annual  imaging.  Patient had PET scan done on 07/06/2018 Which showed there are 2 FDG avid lesion within the left upper lobe worrisome for primary lung neoplasm.  There is evidence of chest wall involvement.  Metastasis to the posterior mediastinum is identified with extension into T11 vertebra and possible involvement of the left T12 neural foramina.  Patient reports left shoulder pain.  Smoking half a pack a day not motivated with further questioning quitting Denies weight loss, hemoptysis, cough.  Chronic shortness of breath with exertion. He drinks alcohol daily. Lives with his girlfriend.  He has 5 adult kids  #Hepatitis C  # 6/1?2020  CT-guided biopsy of left-sided posterior mediastinal soft tissue adjacent to T10/11. Patient's case was discussed on tumor board. Consensus was reached that [facility] versus primary lung cancer with hepatoid adenocarcinoma features. Consensus was to proceed with liver biopsy first as liver mass was not FDG avid on PET scan.  Patient underwent liver biopsy and the present to discuss pathology reports.  ## 08/08/2018 Liver mass biopsy showed fragments of necrotic and calcified tissue with adjacent fibrous capsule Scant viable nonneoplastic liver tissue with steatosis, inflammation and non specific fibrosis.  # 08/28/2018 CT guided left upper lobe lung mass biopsy showed poorly differentiated adenocarcinoma of lung origin.  6/19-08/16/2018  Finished palliative radiation to paraspinal soft tissue mass. 09/07/2018 patient was started on lenvatinib 12 mg daily. 10/13/2018 SBRT to lung cancer lesion.  #Patient is on Zometa monthly #He was advised to take calcium supplement.  He reports he quit taking calcium as he broke out rash on her scalp, neck and chest after taking calcium. # 03/30/19 liver lesion biopsy showed poorly  differentiated adenocarcinoma, compatible with lung primary.  INTERVAL HISTORY Samuel Hood. is a 69 y.o. male who has above history reviewed by me today  presents for evaluation prior to chemotherapy for lung cancer and HCC. Patient reports feeling well. Bilateral hand and feet pain has improved after taking gabapentin 300 mg twice daily. She now only takes oxycodone 2 tablets/day on average for pain.  He continues to use fentanyl patch Patient has been seen by cardiologist for tachycardia.  He has echocardiogram scheduled at the end of July.  He has no new complaints today he has gained 1 pound since last visit  Review of Systems  Constitutional: Positive for fatigue. Negative for appetite change, chills, fever and unexpected weight change.  HENT:   Negative for hearing loss and voice change.   Eyes: Negative for eye problems and icterus.  Respiratory: Negative for chest tightness, cough and shortness of breath.   Cardiovascular: Negative for chest pain and leg swelling.  Gastrointestinal: Negative for abdominal distention, abdominal pain and constipation.  Endocrine: Negative for hot flashes.  Genitourinary: Negative for difficulty urinating, dysuria and frequency.   Musculoskeletal: Negative for arthralgias.  Skin: Negative for itching and rash.  Neurological: Negative for light-headedness and numbness.  Hematological: Negative for adenopathy. Does not bruise/bleed easily.  Psychiatric/Behavioral: Negative for confusion.    MEDICAL HISTORY:  Past Medical History:  Diagnosis Date  . Hepatocellular carcinoma metastatic to left lung ([place]) 07/24/2018  . History of angiography    left lower extremity  . Hypertension   . Small bowel obstruction ([place])     SURGICAL HISTORY: Past Surgical History:  Procedure Laterality Date  . COLON SURGERY    . COLONOSCOPY W/ POLYPECTOMY    . COLONOSCOPY WITH PROPOFOL N/A 01/31/2018   Procedure: COLONOSCOPY WITH PROPOFOL;  Surgeon: Toledo, Benay Pike, MD;  Location: [facility] ENDOSCOPY;  Service: Gastroenterology;  Laterality: N/A;  . debridement fasciotomy leg, left Left 03/19/2016  . LAPAROTOMY N/A  09/27/2014   Procedure: EXPLORATORY LAPAROTOMY;  Surgeon: Dia Crawford III, MD;  Location: [facility] ORS;  Service: General;  Laterality: N/A;  . PORTA CATH INSERTION N/A 04/10/2019   Procedure: PORTA CATH INSERTION;  Surgeon: Katha Cabal, MD;  Location: [place] [facility];  Service: Cardiovascular;  Laterality: N/A;    SOCIAL HISTORY: Social History   Socioeconomic History  . Marital status: Single    Spouse name: Not on file  . Number of children: Not on file  . Years of education: Not on file  . Highest education level: Not on file  Occupational History  . Occupation: retired  Tobacco Use  . Smoking status: Former Smoker    Packs/day: 0.25    Years: 50.00    Pack years: 12.50  . Smokeless tobacco: Never Used  . Tobacco comment: 1-2/week  Vaping Use  . Vaping Use: Never used  Substance and Sexual Activity  . Alcohol use: Not Currently    Alcohol/week: 35.0 standard drinks    Types: 30 Cans of beer, 5 Shots of liquor per week  . Drug use: No  . Sexual activity: Not on file  Other Topics Concern  . Not on file  Social History Narrative  . Not on file   Social Determinants of Health   Financial Resource Strain:   . Difficulty of Paying Living Expenses:   Food Insecurity:   . Worried About Charity fundraiser in the Last Year:   . Potter in the Last Year:  Transportation Needs:   . Film/video editor (Medical):   Marland Kitchen Lack of Transportation (Non-Medical):   Physical Activity:   . Days of Exercise per Week:   . Minutes of Exercise per Session:   Stress:   . Feeling of Stress :   Social Connections:   . Frequency of Communication with Friends and Family:   . Frequency of Social Gatherings with Friends and Family:   . Attends Religious Services:   . Active Member of Clubs or Organizations:   . Attends Archivist Meetings:   Marland Kitchen Marital Status:   Intimate Partner Violence:   . Fear of Current or Ex-Partner:   . Emotionally Abused:   Marland Kitchen  Physically Abused:   . Sexually Abused:     FAMILY HISTORY: Family History  Problem Relation Age of Onset  . Cancer Mother   . Cancer Father     ALLERGIES:  is allergic to 5-alpha reductase inhibitors.  MEDICATIONS:  Current Outpatient Medications  Medication Sig Dispense Refill  . dexamethasone (DECADRON) 4 MG tablet Take 2 tablets (8 mg total) by mouth daily. Start the day after carboplatin chemotherapy for 3 days. 30 tablet 1  . fentaNYL (DURAGESIC) 25 MCG/HR Place 1 patch onto the skin every 3 (three) days. 10 patch 0  . gabapentin (NEURONTIN) 300 MG capsule Take 1 capsule (300 mg total) by mouth 2 (two) times daily. 60 capsule 0  . lidocaine-prilocaine (EMLA) cream Apply to affected area once (Patient taking differently: Apply 1 application topically daily as needed (port acess). ) 30 g 3  . loperamide (IMODIUM) 2 MG capsule Take 1 capsule (2 mg total) by mouth See admin instructions. With onset of loose stool, take 71m followed by 238mevery 2 hours until 12 hours have passed without loose bowel movement. Maximum: 16 mg/day 60 capsule 1  . megestrol (MEGACE) 40 MG tablet Take 1 tablet (40 mg total) by mouth 2 (two) times daily. 60 tablet 0  . Multiple Vitamin (MULTIVITAMIN) capsule Take 1 capsule by mouth daily.    . Marland KitchenARCAN 4 MG/0.1ML LIQD nasal spray kit 1 spray once.     . ondansetron (ZOFRAN) 8 MG tablet Take 1 tablet (8 mg total) by mouth 2 (two) times daily as needed for refractory nausea / vomiting. Start on day 3 after carboplatin chemo. 30 tablet 1  . oxyCODONE (ROXICODONE) 5 MG immediate release tablet Take 1 tablet (5 mg total) by mouth every 8 (eight) hours as needed for severe pain or breakthrough pain. 60 tablet 0  . polyethylene glycol (MIRALAX / GLYCOLAX) 17 g packet Take 17 g by mouth daily. 14 each 0  . potassium chloride SA (KLOR-CON M20) 20 MEQ tablet Take 1 tablet (20 mEq total) by mouth daily. 30 tablet 0  . prochlorperazine (COMPAZINE) 10 MG tablet Take 1 tablet  (10 mg total) by mouth every 6 (six) hours as needed (Nausea or vomiting). 30 tablet 1  . senna (SENOKOT) 8.6 MG TABS tablet Take 1 tablet (8.6 mg total) by mouth daily. 120 tablet 0   No current facility-administered medications for this visit.     PHYSICAL EXAMINATION: ECOG PERFORMANCE STATUS: 1 - Symptomatic but completely ambulatory Vitals:   08/15/19 0847  BP: (!) 122/97  Pulse: (!) 106  Resp: 18  Temp: (!) 96.6 F (35.9 C)  SpO2: 100%   Filed Weights   08/15/19 0847  Weight: 136 lb 9.6 oz (62 kg)    Physical Exam Constitutional:  General: He is not in acute distress.    Appearance: He is not ill-appearing.  HENT:     Head: Normocephalic and atraumatic.  Eyes:     General: No scleral icterus. Cardiovascular:     Rate and Rhythm: Regular rhythm. Tachycardia present.     Heart sounds: Normal heart sounds.  Pulmonary:     Effort: Pulmonary effort is normal. No respiratory distress.     Breath sounds: No wheezing or rales.  Abdominal:     General: Bowel sounds are normal. There is no distension.     Palpations: Abdomen is soft.  Musculoskeletal:        General: No deformity. Normal range of motion.     Cervical back: Normal range of motion and neck supple.  Skin:    General: Skin is warm and dry.     Findings: No erythema or rash.  Neurological:     Mental Status: He is alert and oriented to person, place, and time. Mental status is at baseline.     Cranial Nerves: No cranial nerve deficit.     Coordination: Coordination normal.  Psychiatric:        Mood and Affect: Mood normal.     LABORATORY DATA:  I have reviewed the data as listed Lab Results  Component Value Date   WBC 7.1 08/08/2019   HGB 11.5 (L) 08/08/2019   HCT 34.9 (L) 08/08/2019   MCV 87.0 08/08/2019   PLT 306 08/08/2019   Recent Labs    07/18/19 0757 07/24/19 1243 08/08/19 0819  NA 139 136 141  K 3.6 3.7 2.8*  CL 103 101 105  CO2 _0 GLUCOSE 136* 173* 112*  BUN 5* 16 8   CREATININE 0.52* 0.59* 0.52*  CALCIUM 8.8* 9.2 8.8*  GFRNONAA >60 >60 >60  GFRAA >60 >60 >60  PROT 7.1 7.0 6.9  ALBUMIN 2.9* 3.1* 3.2*  AST 31 55* 43*  ALT 22 54* 40  ALKPHOS 65 62 75  BILITOT 0.7 0.8 0.6   Iron/TIBC/Ferritin/ %Sat No results found for: IRON, TIBC, FERRITIN, IRONPCTSAT   RADIOGRAPHIC STUDIES: I have personally reviewed the radiological images as listed and agreed with the findings in the report. CT Chest W Contrast  Result Date: 07/04/2019 CLINICAL DATA:  Stage IV adenocarcinoma of the lung. EXAM: CT CHEST, ABDOMEN, AND PELVIS WITH CONTRAST TECHNIQUE: Multidetector CT imaging of the chest, abdomen and pelvis was performed following the standard protocol during bolus administration of intravenous contrast. CONTRAST:  72m OMNIPAQUE IOHEXOL 300 MG/ML  SOLN COMPARISON:  03/26/2019 FINDINGS: CT CHEST FINDINGS Cardiovascular: The heart size is normal. No substantial pericardial effusion. Coronary artery calcification is evident. Atherosclerotic calcification is noted in the wall of the thoracic aorta. Ascending thoracic aorta measures 4.1 cm diameter. Right-sided Port-A-Cath is looped in the right jugular vein with the tip directed centrally at the innominate vein confluence. Mediastinum/Nodes: No mediastinal lymphadenopathy. There is no hilar lymphadenopathy. There is no axillary lymphadenopathy. The esophagus has normal imaging features. Lungs/Pleura: Centrilobular and paraseptal emphysema evident. Bullous change noted in the apices, left greater than right. Architectural distortion with patchy ill-defined consolidative opacity in the anterior left upper lobe is similar to prior and compatible with sequelae of prior therapy. No new suspicious pulmonary nodule or mass. No pleural effusion. Musculoskeletal: Lucent lesion in the T11 vertebral body is stable. Small focus of left paraspinal soft tissue at T11 is unchanged. CT ABDOMEN PELVIS FINDINGS Hepatobiliary: Central liver mass  (segment VIII) is stable at  2.9 x 2.8 cm today compared to 3.1 x 2.9 cm previously. Smaller immediately adjacent lesion measures 1.3 x 1.4 cm today (59/2) compared to 1.5 x 1.5 cm previously. Ill-defined hypoenhancing lesion posterior right liver (66/2) measures 1.5 x 1.5 cm today compared to 2.2 x 2.4 cm previously. A very subtle 7 mm hypoattenuating lesion in the posterior right liver identified on the previous study is not discernible on today's exam. Calcified gallstones again noted. No intrahepatic or extrahepatic biliary dilation. Pancreas: No focal mass lesion. No dilatation of the main duct. No intraparenchymal cyst. No peripancreatic edema. Spleen: No splenomegaly. No focal mass lesion. Adrenals/Urinary Tract: No adrenal nodule or mass. Small right renal cysts are stable. Left kidney unremarkable. No evidence for hydroureter. The urinary bladder appears normal for the degree of distention. Stomach/Bowel: Stomach is unremarkable. No gastric wall thickening. No evidence of outlet obstruction. Duodenum is normally positioned as is the ligament of Treitz. No small bowel wall thickening. No small bowel dilatation. The terminal ileum is normal. The appendix is not visualized, but there is no edema or inflammation in the region of the cecum. Left colon anastomosis again noted. Vascular/Lymphatic: There is abdominal aortic atherosclerosis without aneurysm. There is no gastrohepatic or hepatoduodenal ligament lymphadenopathy. No retroperitoneal or mesenteric lymphadenopathy. No pelvic sidewall lymphadenopathy. Reproductive: The prostate gland and seminal vesicles are unremarkable. Other: No intraperitoneal free fluid. Musculoskeletal: No worrisome lytic or sclerotic osseous abnormality. IMPRESSION: 1. No new or progressive findings in the chest, abdomen, or pelvis. 2. Hepatic metastases have decreased in the interval and a very subtle 7 mm hypoattenuating lesion in the posterior right liver identified on the  previous study is not discernible on today's exam. 3. Stable appearance of the lucent lesion in the T11 vertebral body with no change in the small focus of adjacent left paraspinal soft tissue attenuation. 4. The right-sided Port-A-Cath is looped in the right internal jugular vein with the tip directed centrally at the level of the innominate vein confluence. 5. Cholelithiasis. 6. Ascending thoracic aortic aneurysm at 4.1 cm diameter. Continued attention on follow-up recommended. 7. Aortic Atherosclerosis (ICD10-I70.0) and Emphysema (ICD10-J43.9). Electronically Signed   By: Misty Stanley M.D.   On: 07/04/2019 09:07   CT ANGIO CHEST PE W OR WO CONTRAST  Result Date: 07/24/2019 CLINICAL DATA:  Chest pain and shortness of breath. History of stage IV adenocarcinoma of the left lung. EXAM: CT ANGIOGRAPHY CHEST WITH CONTRAST TECHNIQUE: Multidetector CT imaging of the chest was performed using the standard protocol during bolus administration of intravenous contrast. Multiplanar CT image reconstructions and MIPs were obtained to evaluate the vascular anatomy. CONTRAST:  8m OMNIPAQUE IOHEXOL 350 MG/ML SOLN COMPARISON:  Chest CT 07/04/2019 and CT angio chest 03/26/2019 FINDINGS: Cardiovascular: The heart is normal in size. No pericardial effusion. Stable mild fusiform aneurysmal dilatation of the ascending thoracic aorta with maximum measurement of 4.1 cm. No dissection. Stable atherosclerotic calcifications at the aortic arch. Stable scattered coronary artery calcifications. The pulmonary arterial tree is well opacified. No filling defects to suggest pulmonary embolism. Mediastinum/Nodes: No mediastinal or hilar mass or adenopathy. Small scattered lymph nodes are stable. The esophagus is grossly normal. Lungs/Pleura: Stable advanced emphysematous changes and pulmonary scarring. Stable radiation changes involving the left upper lobe no findings for recurrent tumor. No new pulmonary nodules. No pleural effusions or  pleural nodules. Upper Abdomen: Stable central right hepatic lobe lesion which measures 2.9 x 2.6 cm. Stable 13 mm lesion in segment 6 near the right kidney. No new hepatic  lesions. No adrenal gland lesions. Musculoskeletal: No significant bony findings. There is a stable lucent lesion in the T11 vertebral body with smooth corticated margins. No new bone lesions. Review of the MIP images confirms the above findings. IMPRESSION: 1. No CT findings for pulmonary embolism. 2. Stable mild fusiform aneurysmal dilatation of the ascending thoracic aorta with maximum measurement of 4.1 cm. No dissection. Recommend continued observation. 3. Stable radiation changes involving the left upper lobe. No findings for recurrent tumor. 4. Stable advanced emphysematous changes and pulmonary scarring. No new pulmonary nodules. 5. Stable hepatic lesions.  No new or progressive findings. 6. Emphysema and aortic atherosclerosis. Aortic Atherosclerosis (ICD10-I70.0) and Emphysema (ICD10-J43.9). Electronically Signed   By: Marijo Sanes M.D.   On: 07/24/2019 15:45   CT Abdomen Pelvis W Contrast  Result Date: 07/04/2019 CLINICAL DATA:  Stage IV adenocarcinoma of the lung. EXAM: CT CHEST, ABDOMEN, AND PELVIS WITH CONTRAST TECHNIQUE: Multidetector CT imaging of the chest, abdomen and pelvis was performed following the standard protocol during bolus administration of intravenous contrast. CONTRAST:  3m OMNIPAQUE IOHEXOL 300 MG/ML  SOLN COMPARISON:  03/26/2019 FINDINGS: CT CHEST FINDINGS Cardiovascular: The heart size is normal. No substantial pericardial effusion. Coronary artery calcification is evident. Atherosclerotic calcification is noted in the wall of the thoracic aorta. Ascending thoracic aorta measures 4.1 cm diameter. Right-sided Port-A-Cath is looped in the right jugular vein with the tip directed centrally at the innominate vein confluence. Mediastinum/Nodes: No mediastinal lymphadenopathy. There is no hilar lymphadenopathy.  There is no axillary lymphadenopathy. The esophagus has normal imaging features. Lungs/Pleura: Centrilobular and paraseptal emphysema evident. Bullous change noted in the apices, left greater than right. Architectural distortion with patchy ill-defined consolidative opacity in the anterior left upper lobe is similar to prior and compatible with sequelae of prior therapy. No new suspicious pulmonary nodule or mass. No pleural effusion. Musculoskeletal: Lucent lesion in the T11 vertebral body is stable. Small focus of left paraspinal soft tissue at T11 is unchanged. CT ABDOMEN PELVIS FINDINGS Hepatobiliary: Central liver mass (segment VIII) is stable at 2.9 x 2.8 cm today compared to 3.1 x 2.9 cm previously. Smaller immediately adjacent lesion measures 1.3 x 1.4 cm today (59/2) compared to 1.5 x 1.5 cm previously. Ill-defined hypoenhancing lesion posterior right liver (66/2) measures 1.5 x 1.5 cm today compared to 2.2 x 2.4 cm previously. A very subtle 7 mm hypoattenuating lesion in the posterior right liver identified on the previous study is not discernible on today's exam. Calcified gallstones again noted. No intrahepatic or extrahepatic biliary dilation. Pancreas: No focal mass lesion. No dilatation of the main duct. No intraparenchymal cyst. No peripancreatic edema. Spleen: No splenomegaly. No focal mass lesion. Adrenals/Urinary Tract: No adrenal nodule or mass. Small right renal cysts are stable. Left kidney unremarkable. No evidence for hydroureter. The urinary bladder appears normal for the degree of distention. Stomach/Bowel: Stomach is unremarkable. No gastric wall thickening. No evidence of outlet obstruction. Duodenum is normally positioned as is the ligament of Treitz. No small bowel wall thickening. No small bowel dilatation. The terminal ileum is normal. The appendix is not visualized, but there is no edema or inflammation in the region of the cecum. Left colon anastomosis again noted.  Vascular/Lymphatic: There is abdominal aortic atherosclerosis without aneurysm. There is no gastrohepatic or hepatoduodenal ligament lymphadenopathy. No retroperitoneal or mesenteric lymphadenopathy. No pelvic sidewall lymphadenopathy. Reproductive: The prostate gland and seminal vesicles are unremarkable. Other: No intraperitoneal free fluid. Musculoskeletal: No worrisome lytic or sclerotic osseous abnormality. IMPRESSION: 1. No  new or progressive findings in the chest, abdomen, or pelvis. 2. Hepatic metastases have decreased in the interval and a very subtle 7 mm hypoattenuating lesion in the posterior right liver identified on the previous study is not discernible on today's exam. 3. Stable appearance of the lucent lesion in the T11 vertebral body with no change in the small focus of adjacent left paraspinal soft tissue attenuation. 4. The right-sided Port-A-Cath is looped in the right internal jugular vein with the tip directed centrally at the level of the innominate vein confluence. 5. Cholelithiasis. 6. Ascending thoracic aortic aneurysm at 4.1 cm diameter. Continued attention on follow-up recommended. 7. Aortic Atherosclerosis (ICD10-I70.0) and Emphysema (ICD10-J43.9). Electronically Signed   By: Misty Stanley M.D.   On: 07/04/2019 09:07   US Venous Img Lower Bilateral (DVT)  Result Date: 08/02/2019 CLINICAL DATA:  Bilateral lower extremity pain and edema for the past 2 weeks. History of smoking. History of lung cancer. Evaluate for DVT. EXAM: BILATERAL LOWER EXTREMITY VENOUS DOPPLER ULTRASOUND TECHNIQUE: Gray-scale sonography with graded compression, as well as color Doppler and duplex ultrasound were performed to evaluate the lower extremity deep venous systems from the level of the common femoral vein and including the common femoral, femoral, profunda femoral, popliteal and calf veins including the posterior tibial, peroneal and gastrocnemius veins when visible. The superficial great saphenous vein  was also interrogated. Spectral Doppler was utilized to evaluate flow at rest and with distal augmentation maneuvers in the common femoral, femoral and popliteal veins. COMPARISON:  None. FINDINGS: RIGHT LOWER EXTREMITY Common Femoral Vein: No evidence of thrombus. Normal compressibility, respiratory phasicity and response to augmentation. Saphenofemoral Junction: No evidence of thrombus. Normal compressibility and flow on color Doppler imaging. Profunda Femoral Vein: No evidence of thrombus. Normal compressibility and flow on color Doppler imaging. Femoral Vein: No evidence of thrombus. Normal compressibility, respiratory phasicity and response to augmentation. Popliteal Vein: No evidence of thrombus. Normal compressibility, respiratory phasicity and response to augmentation. Calf Veins: No evidence of thrombus. Normal compressibility and flow on color Doppler imaging. Superficial Great Saphenous Vein: No evidence of thrombus. Normal compressibility. Venous Reflux:  None. Other Findings:  None. LEFT LOWER EXTREMITY Common Femoral Vein: No evidence of thrombus. Normal compressibility, respiratory phasicity and response to augmentation. Saphenofemoral Junction: No evidence of thrombus. Normal compressibility and flow on color Doppler imaging. Profunda Femoral Vein: No evidence of thrombus. Normal compressibility and flow on color Doppler imaging. Femoral Vein: No evidence of thrombus. Normal compressibility, respiratory phasicity and response to augmentation. Popliteal Vein: No evidence of thrombus. Normal compressibility, respiratory phasicity and response to augmentation. Calf Veins: No evidence of thrombus. Normal compressibility and flow on color Doppler imaging. Superficial Great Saphenous Vein: No evidence of thrombus. Normal compressibility. Venous Reflux:  None. Other Findings:  None. IMPRESSION: No evidence of DVT within either lower extremity. Electronically Signed   By: Sandi Mariscal M.D.   On: 08/02/2019  10:36      ASSESSMENT & PLAN:  1. Stage IV adenocarcinoma of lung, left ([place])   2. 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 Cancer, metastatic to bone ([place])   4. Encounter for antineoplastic chemotherapy   5. Encounter for antineoplastic immunotherapy   6. Tachycardia   7. Chemotherapy-induced neuropathy ([place])    #Stage IV Lung adenocarcinoma, Stage IV HCC-07/17/2018 paraspinal soft tissue biopsy Patient is on palliative chemotherapy with carboplatin, Taxol, bevacizumab, Tecentriq. Labs are reviewed and discussed with patient. Counts acceptable to proceed with treatment today.  Taxol dose  reduced to 135 mg/m due to neuropathy.  See below.  #Blood pressure has been stable since the discontinuation of amlodipine.  He has establish care with cardiologist who also agree with discontinuation of amlodipine. #Tachycardia, evaluated by cardiology.  Patient has 2D echo scheduled at the end of July. TSH has been checked and was normal.  #Severe hypokalemia, potassium level has normalized to 3.7.  I advised patient to continue and finish his supply of 20 mEq potassium chloride daily.  Then switch to potassium 10 mEq daily.  Prescription was sent to pharmacy.  #Weight loss, likely due to decreased oral intake. Continue megace 59m BID. No improved of his appetite yet.  #Chemotherapy-induced neuropathy, bilateral hands and feet.  Continue gabapentin 300 mg twice daily.  Discussed with patient that if symptoms gets worse, he may increase to 300 mg 3 times daily.  I will decrease Taxol to 135 mg/m due to neuropathy.  #Patient has been on fentanyl patch and oxycodone for neoplasm related pain.  Currently his cancer is stable and I do not think bilateral hand and feet pain are related to his cancer.  I discussed with patient that he has been using oxycodone more frequently than advised. Suggest patient to try gabapentin 300 mg 1-2 times daily and see if that helps his pain.  Patient  to follow-up with palliative care for further management. . We spent sufficient time to discuss many aspect of care, questions were answered to patient's satisfaction. All questions were answered. The patient knows to call the clinic with any problems questions or concerns.   ZEarlie Server MD, PhD 08/15/2019

## 2019-08-15 NOTE — Progress Notes (Signed)
[place]  [phone] [phone]   Name: Samuel Hood. Date: 08/15/2019 MRN: [id number]  DOB: 05/22/1950  Patient Care Team: Jerrol Banana., MD as PCP - General (Family Medicine) Kate Sable, MD as PCP - Cardiology (Cardiology) Telford Nab, RN as Registered Nurse Clent Jacks, RN as Registered Nurse    REASON FOR CONSULTATION: Samuel Hood. is a 69 y.o. male with multiple medical problems including stage IV adenocarcinoma of the lung (diagnosed 03/30/2019) metastatic to liver and bone and chest wall, also with stage IV HCC (diagnosed 07/17/2018). PMH also notable for HCV, history of alcohol use/tobacco abuse. Palliative care was consulted to help address goals and manage ongoing symptoms..    SOCIAL HISTORY:     reports that he has quit smoking. He has a 12.50 pack-year smoking history. He has never used smokeless tobacco. He reports previous alcohol use of about 35.0 standard drinks of alcohol per week. He reports that he does not use drugs.   Patient is a widower after having been married 27 years. He lives at home with a girlfriend. He has 3 daughters and a son. His son is currently incarcerated and he reports having a strained relationship with adaughter due to substance abuse. His daughter, [name], is most involved. Patient previously worked in [employer] (medical).  ADVANCE DIRECTIVES:  On file  CODE STATUS:   PAST MEDICAL HISTORY: Past Medical History:  Diagnosis Date   Hepatocellular carcinoma metastatic to left lung ([place]) 07/24/2018   History of angiography    left lower extremity   Hypertension    Small bowel obstruction ([place])     PAST SURGICAL HISTORY:  Past Surgical History:  Procedure Laterality Date   COLON SURGERY     COLONOSCOPY W/ POLYPECTOMY     COLONOSCOPY WITH PROPOFOL N/A 01/31/2018   Procedure: COLONOSCOPY WITH PROPOFOL;  Surgeon: Toledo, Benay Pike, MD;   Location: [facility] ENDOSCOPY;  Service: Gastroenterology;  Laterality: N/A;   debridement fasciotomy leg, left Left 03/19/2016   LAPAROTOMY N/A 09/27/2014   Procedure: EXPLORATORY LAPAROTOMY;  Surgeon: Dia Crawford III, MD;  Location: [facility] ORS;  Service: General;  Laterality: N/A;   PORTA CATH INSERTION N/A 04/10/2019   Procedure: PORTA CATH INSERTION;  Surgeon: Katha Cabal, MD;  Location: [facility];  Service: Cardiovascular;  Laterality: N/A;    HEMATOLOGY/ONCOLOGY HISTORY:  Oncology History Overview Note  Samuel Hood. is a  69 y.o.  male with PMH listed below was seen in consultation at the request of  Pollak, Adriana M, PA-C  for evaluation of lung nodule in the liver lesion. Patient has a past medical history of hypertension, alcohol abuse, smoking emphysema.  He has had serial CTs done in the past.  CT images were independently reviewed by me. 07/26/2017 CT chest with contrast showed extensive pleural-parenchymal scarring within the left upper lobe with several areas of soft tissue nodularity measuring up to 1.6 cm.  In this patient who is at increased risk of lung cancer further investigation with PET scan is recommended. Small chronic appearance loculated hydropneumothorax overlies the left apex. Slowly enlarging lesion within the central portion of the right lobe of liver identified. Per note, patient supposed to have additional work-up done in December repeat CT scan which he did not   Patient was recently seen by primary care provider and has subsequent image done for follow-up. CT on 06/30/2018 showed multiple significantly enlarged paraspinal mass are  noted concerning.  The largest measures 3.8 x 0.5 cm and there appears to be a lytic destruction of the adjacent T11 vertebral body.  Also noted is new solid density measuring 17 x 12 mm in the pleural parenchymal density in the left upper lobe noted on prior exam.  Concerning for malignancy.  4.4 ascending thoracic aortic  aneurysm.  Recommend annual imaging.   Patient had PET scan done on 07/06/2018 Which showed there are 2 FDG avid lesion within the left upper lobe worrisome for primary lung neoplasm.  There is evidence of chest wall involvement.  Metastasis to the posterior mediastinum is identified with extension into T11 vertebra and possible involvement of the left T12 neural foramina.   Patient reports left shoulder pain.  Smoking half a pack a day not motivated with further questioning quitting Denies weight loss, hemoptysis, cough.  Chronic shortness of breath with exertion. He drinks alcohol daily. Lives with his girlfriend.  He has 5 adult kids   #Hepatitis C   # 6/1?2020  CT-guided biopsy of left-sided posterior mediastinal soft tissue adjacent to T10/11. Patient's case was discussed on tumor board. Consensus was reached that [facility] versus primary lung cancer with hepatoid adenocarcinoma features. Consensus was to proceed with liver biopsy first as liver mass was not FDG avid on PET scan.  Patient underwent liver biopsy and the present to discuss pathology reports.   ## 08/08/2018 Liver mass biopsy showed fragments of necrotic and calcified tissue with adjacent fibrous capsule Scant viable nonneoplastic liver tissue with steatosis, inflammation and non specific fibrosis.  # 08/28/2018 CT guided left upper lobe lung mass biopsy showed poorly differentiated adenocarcinoma of lung origin.  6/19-08/16/2018  Finished palliative radiation to paraspinal soft tissue mass.   Hepatocellular carcinoma metastatic to left lung ([place])  07/24/2018 Initial Diagnosis   Hepatocellular carcinoma metastatic to left lung ([place])   04/16/2019 -  Chemotherapy   The patient had dexamethasone (DECADRON) 4 MG tablet, 8 mg, Oral, Daily, 1 of 1 cycle, Start date: 04/06/2019, End date: -- palonosetron (ALOXI) injection 0.25 mg, 0.25 mg, Intravenous,  Once, 6 of 6 cycles Administration: 0.25 mg (04/16/2019), 0.25 mg (05/09/2019), 0.25 mg  (05/30/2019), 0.25 mg (06/20/2019), 0.25 mg (07/18/2019) CARBOplatin (PARAPLATIN) 470 mg in sodium chloride 0.9 % 250 mL chemo infusion, 470 mg (100 % of original dose 465.5 mg), Intravenous,  Once, 6 of 6 cycles Dose modification:   (original dose 465.5 mg, Cycle 1) Administration: 470 mg (04/16/2019), 470 mg (05/09/2019), 470 mg (05/30/2019), 470 mg (06/20/2019), 420 mg (07/18/2019) fosaprepitant (EMEND) 150 mg in sodium chloride 0.9 % 145 mL IVPB, 150 mg, Intravenous,  Once, 6 of 6 cycles Administration: 150 mg (04/16/2019), 150 mg (05/09/2019), 150 mg (05/30/2019), 150 mg (06/20/2019), 150 mg (07/18/2019) PACLitaxel (TAXOL) 354 mg in sodium chloride 0.9 % 500 mL chemo infusion (> '80mg'$ /m2), 200 mg/m2 = 354 mg, Intravenous,  Once, 6 of 6 cycles Dose modification: 175 mg/m2 (original dose 200 mg/m2, Cycle 6, Reason: Provider Judgment), 135 mg/m2 (original dose 200 mg/m2, Cycle 6, Reason: Other (see comments), Comment: neuropathy) Administration: 354 mg (04/16/2019), 354 mg (05/09/2019), 354 mg (05/30/2019), 354 mg (06/20/2019), 306 mg (07/18/2019) atezolizumab (TECENTRIQ) 1,200 mg in sodium chloride 0.9 % 250 mL chemo infusion, 1,200 mg, Intravenous, Once, 6 of 10 cycles Administration: 1,200 mg (05/09/2019), 1,200 mg (05/30/2019), 1,200 mg (06/20/2019), 1,200 mg (04/18/2019), 1,200 mg (07/18/2019) bevacizumab-bvzr (ZIRABEV) 1,000 mg in sodium chloride 0.9 % 100 mL chemo infusion, 15 mg/kg = 1,000 mg, Intravenous,  Once,  6 of 10 cycles Administration: 1,000 mg (05/09/2019), 1,000 mg (05/30/2019), 1,000 mg (06/20/2019), 1,000 mg (04/18/2019), 1,000 mg (07/18/2019)  for chemotherapy treatment.    Stage IV adenocarcinoma of lung, left ([place])  09/02/2018 Initial Diagnosis   Primary lung adenocarcinoma, left ([place])   04/16/2019 -  Chemotherapy   The patient had dexamethasone (DECADRON) 4 MG tablet, 8 mg, Oral, Daily, 1 of 1 cycle, Start date: 04/06/2019, End date: -- palonosetron (ALOXI) injection 0.25 mg, 0.25 mg, Intravenous,  Once, 6 of 6  cycles Administration: 0.25 mg (04/16/2019), 0.25 mg (05/09/2019), 0.25 mg (05/30/2019), 0.25 mg (06/20/2019), 0.25 mg (07/18/2019) CARBOplatin (PARAPLATIN) 470 mg in sodium chloride 0.9 % 250 mL chemo infusion, 470 mg (100 % of original dose 465.5 mg), Intravenous,  Once, 6 of 6 cycles Dose modification:   (original dose 465.5 mg, Cycle 1) Administration: 470 mg (04/16/2019), 470 mg (05/09/2019), 470 mg (05/30/2019), 470 mg (06/20/2019), 420 mg (07/18/2019) fosaprepitant (EMEND) 150 mg in sodium chloride 0.9 % 145 mL IVPB, 150 mg, Intravenous,  Once, 6 of 6 cycles Administration: 150 mg (04/16/2019), 150 mg (05/09/2019), 150 mg (05/30/2019), 150 mg (06/20/2019), 150 mg (07/18/2019) PACLitaxel (TAXOL) 354 mg in sodium chloride 0.9 % 500 mL chemo infusion (> '80mg'$ /m2), 200 mg/m2 = 354 mg, Intravenous,  Once, 6 of 6 cycles Dose modification: 175 mg/m2 (original dose 200 mg/m2, Cycle 6, Reason: Provider Judgment), 135 mg/m2 (original dose 200 mg/m2, Cycle 6, Reason: Other (see comments), Comment: neuropathy) Administration: 354 mg (04/16/2019), 354 mg (05/09/2019), 354 mg (05/30/2019), 354 mg (06/20/2019), 306 mg (07/18/2019) atezolizumab (TECENTRIQ) 1,200 mg in sodium chloride 0.9 % 250 mL chemo infusion, 1,200 mg, Intravenous, Once, 6 of 10 cycles Administration: 1,200 mg (05/09/2019), 1,200 mg (05/30/2019), 1,200 mg (06/20/2019), 1,200 mg (04/18/2019), 1,200 mg (07/18/2019) bevacizumab-bvzr (ZIRABEV) 1,000 mg in sodium chloride 0.9 % 100 mL chemo infusion, 15 mg/kg = 1,000 mg, Intravenous,  Once, 6 of 10 cycles Administration: 1,000 mg (05/09/2019), 1,000 mg (05/30/2019), 1,000 mg (06/20/2019), 1,000 mg (04/18/2019), 1,000 mg (07/18/2019)  for chemotherapy treatment.    Cancer, metastatic to bone ([place])  12/16/2018 Initial Diagnosis   Cancer, metastatic to bone ([place])   04/16/2019 -  Chemotherapy   The patient had dexamethasone (DECADRON) 4 MG tablet, 8 mg, Oral, Daily, 1 of 1 cycle, Start date: 04/06/2019, End date: -- palonosetron (ALOXI)  injection 0.25 mg, 0.25 mg, Intravenous,  Once, 6 of 6 cycles Administration: 0.25 mg (04/16/2019), 0.25 mg (05/09/2019), 0.25 mg (05/30/2019), 0.25 mg (06/20/2019), 0.25 mg (07/18/2019) CARBOplatin (PARAPLATIN) 470 mg in sodium chloride 0.9 % 250 mL chemo infusion, 470 mg (100 % of original dose 465.5 mg), Intravenous,  Once, 6 of 6 cycles Dose modification:   (original dose 465.5 mg, Cycle 1) Administration: 470 mg (04/16/2019), 470 mg (05/09/2019), 470 mg (05/30/2019), 470 mg (06/20/2019), 420 mg (07/18/2019) fosaprepitant (EMEND) 150 mg in sodium chloride 0.9 % 145 mL IVPB, 150 mg, Intravenous,  Once, 6 of 6 cycles Administration: 150 mg (04/16/2019), 150 mg (05/09/2019), 150 mg (05/30/2019), 150 mg (06/20/2019), 150 mg (07/18/2019) PACLitaxel (TAXOL) 354 mg in sodium chloride 0.9 % 500 mL chemo infusion (> '80mg'$ /m2), 200 mg/m2 = 354 mg, Intravenous,  Once, 6 of 6 cycles Dose modification: 175 mg/m2 (original dose 200 mg/m2, Cycle 6, Reason: Provider Judgment), 135 mg/m2 (original dose 200 mg/m2, Cycle 6, Reason: Other (see comments), Comment: neuropathy) Administration: 354 mg (04/16/2019), 354 mg (05/09/2019), 354 mg (05/30/2019), 354 mg (06/20/2019), 306 mg (07/18/2019) atezolizumab (TECENTRIQ) 1,200 mg in sodium chloride 0.9 %  250 mL chemo infusion, 1,200 mg, Intravenous, Once, 6 of 10 cycles Administration: 1,200 mg (05/09/2019), 1,200 mg (05/30/2019), 1,200 mg (06/20/2019), 1,200 mg (04/18/2019), 1,200 mg (07/18/2019) bevacizumab-bvzr (ZIRABEV) 1,000 mg in sodium chloride 0.9 % 100 mL chemo infusion, 15 mg/kg = 1,000 mg, Intravenous,  Once, 6 of 10 cycles Administration: 1,000 mg (05/09/2019), 1,000 mg (05/30/2019), 1,000 mg (06/20/2019), 1,000 mg (04/18/2019), 1,000 mg (07/18/2019)  for chemotherapy treatment.      ALLERGIES:  is allergic to 5-alpha reductase inhibitors.  MEDICATIONS:  Current Outpatient Medications  Medication Sig Dispense Refill   dexamethasone (DECADRON) 4 MG tablet Take 2 tablets (8 mg total) by mouth daily.  Start the day after carboplatin chemotherapy for 3 days. 30 tablet 1   fentaNYL (DURAGESIC) 25 MCG/HR Place 1 patch onto the skin every 3 (three) days. 10 patch 0   gabapentin (NEURONTIN) 300 MG capsule Take 1 capsule (300 mg total) by mouth 2 (two) times daily. 60 capsule 0   lidocaine-prilocaine (EMLA) cream Apply to affected area once (Patient taking differently: Apply 1 application topically daily as needed (port acess). ) 30 g 3   loperamide (IMODIUM) 2 MG capsule Take 1 capsule (2 mg total) by mouth See admin instructions. With onset of loose stool, take '4mg'$  followed by '2mg'$  every 2 hours until 12 hours have passed without loose bowel movement. Maximum: 16 mg/day (Patient not taking: Reported on 08/15/2019) 60 capsule 1   megestrol (MEGACE) 40 MG tablet Take 1 tablet (40 mg total) by mouth 2 (two) times daily. (Patient not taking: Reported on 08/15/2019) 60 tablet 0   Multiple Vitamin (MULTIVITAMIN) capsule Take 1 capsule by mouth daily.     NARCAN 4 MG/0.1ML LIQD nasal spray kit 1 spray once.  (Patient not taking: Reported on 08/15/2019)     ondansetron (ZOFRAN) 8 MG tablet Take 1 tablet (8 mg total) by mouth 2 (two) times daily as needed for refractory nausea / vomiting. Start on day 3 after carboplatin chemo. 30 tablet 1   oxyCODONE (ROXICODONE) 5 MG immediate release tablet Take 1 tablet (5 mg total) by mouth every 8 (eight) hours as needed for severe pain or breakthrough pain. 60 tablet 0   polyethylene glycol (MIRALAX / GLYCOLAX) 17 g packet Take 17 g by mouth daily. 14 each 0   potassium chloride (KLOR-CON) 10 MEQ tablet Take 1 tablet (10 mEq total) by mouth daily. 60 tablet 0   potassium chloride SA (KLOR-CON M20) 20 MEQ tablet Take 1 tablet (20 mEq total) by mouth daily. 30 tablet 0   prochlorperazine (COMPAZINE) 10 MG tablet Take 1 tablet (10 mg total) by mouth every 6 (six) hours as needed (Nausea or vomiting). 30 tablet 1   senna (SENOKOT) 8.6 MG TABS tablet Take 1 tablet  (8.6 mg total) by mouth daily. 120 tablet 0   No current facility-administered medications for this visit.   Facility-Administered Medications Ordered in Other Visits  Medication Dose Route Frequency Provider Last Rate Last Admin   CARBOplatin (PARAPLATIN) 420 mg in sodium chloride 0.9 % 250 mL chemo infusion  420 mg Intravenous Once Earlie Server, MD       heparin lock flush 100 unit/mL  500 Units Intravenous Once Earlie Server, MD       heparin lock flush 100 unit/mL  500 Units Intracatheter Once PRN Earlie Server, MD       PACLitaxel (TAXOL) 240 mg in sodium chloride 0.9 % 250 mL chemo infusion (> '80mg'$ /m2)  135 mg/m2 (Treatment Plan  Recorded) Intravenous Once Earlie Server, MD 97 mL/hr at 08/15/19 1215 240 mg at 08/15/19 1215    VITAL SIGNS: There were no vitals taken for this visit. There were no vitals filed for this visit.  Estimated body mass index is 22.73 kg/m as calculated from the following:   Height as of 08/13/19: '5\' 5"'$  (1.651 m).   Weight as of an earlier encounter on 08/15/19: 136 lb 9.6 oz (62 kg).  LABS: CBC:    Component Value Date/Time   WBC 6.3 08/15/2019 0817   HGB 10.5 (L) 08/15/2019 0817   HGB 14.6 06/23/2018 1100   HCT 31.8 (L) 08/15/2019 0817   HCT 43.2 06/23/2018 1100   PLT 272 08/15/2019 0817   PLT 297 06/23/2018 1100   MCV 88.3 08/15/2019 0817   MCV 96 06/23/2018 1100   MCV 88 09/14/2013 0511   NEUTROABS 4.2 08/15/2019 0817   NEUTROABS 2.8 06/23/2018 1100   NEUTROABS 12.4 (H) 09/14/2013 0511   LYMPHSABS 1.1 08/15/2019 0817   LYMPHSABS 1.8 06/23/2018 1100   LYMPHSABS 2.3 09/14/2013 0511   MONOABS 0.8 08/15/2019 0817   MONOABS 1.1 (H) 09/14/2013 0511   EOSABS 0.1 08/15/2019 0817   EOSABS 0.1 06/23/2018 1100   EOSABS 0.2 09/14/2013 0511   BASOSABS 0.0 08/15/2019 0817   BASOSABS 0.0 06/23/2018 1100   BASOSABS 0.0 09/14/2013 0511   Comprehensive Metabolic Panel:    Component Value Date/Time   NA 137 08/15/2019 0817   NA 136 06/23/2018 1100   NA 139  09/13/2013 0408   K 3.7 08/15/2019 0817   K 3.3 (L) 09/13/2013 0408   CL 103 08/15/2019 0817   CL 105 09/13/2013 0408   CO2 25 08/15/2019 0817   CO2 27 09/13/2013 0408   BUN 5 (L) 08/15/2019 0817   BUN 6 (L) 06/23/2018 1100   BUN 4 (L) 09/13/2013 0408   CREATININE 0.53 (L) 08/15/2019 0817   CREATININE 0.67 09/13/2013 0408   GLUCOSE 131 (H) 08/15/2019 0817   GLUCOSE 108 (H) 09/13/2013 0408   CALCIUM 8.6 (L) 08/15/2019 0817   CALCIUM 8.1 (L) 09/13/2013 0408   AST 41 08/15/2019 0817   AST 30 09/13/2013 0408   ALT 35 08/15/2019 0817   ALT 27 09/13/2013 0408   ALKPHOS 72 08/15/2019 0817   ALKPHOS 218 (H) 09/13/2013 0408   BILITOT 0.5 08/15/2019 0817   BILITOT 0.5 06/23/2018 1100   BILITOT 0.9 09/13/2013 0408   PROT 6.8 08/15/2019 0817   PROT 7.4 06/23/2018 1100   PROT 6.2 (L) 09/13/2013 0408   ALBUMIN 3.0 (L) 08/15/2019 0817   ALBUMIN 4.0 06/23/2018 1100   ALBUMIN 1.9 (L) 09/13/2013 0408    RADIOGRAPHIC STUDIES: CT ANGIO CHEST PE W OR WO CONTRAST  Result Date: 07/24/2019 CLINICAL DATA:  Chest pain and shortness of breath. History of stage IV adenocarcinoma of the left lung. EXAM: CT ANGIOGRAPHY CHEST WITH CONTRAST TECHNIQUE: Multidetector CT imaging of the chest was performed using the standard protocol during bolus administration of intravenous contrast. Multiplanar CT image reconstructions and MIPs were obtained to evaluate the vascular anatomy. CONTRAST:  100m OMNIPAQUE IOHEXOL 350 MG/ML SOLN COMPARISON:  Chest CT 07/04/2019 and CT angio chest 03/26/2019 FINDINGS: Cardiovascular: The heart is normal in size. No pericardial effusion. Stable mild fusiform aneurysmal dilatation of the ascending thoracic aorta with maximum measurement of 4.1 cm. No dissection. Stable atherosclerotic calcifications at the aortic arch. Stable scattered coronary artery calcifications. The pulmonary arterial tree is well opacified. No filling defects to suggest  pulmonary embolism. Mediastinum/Nodes: No  mediastinal or hilar mass or adenopathy. Small scattered lymph nodes are stable. The esophagus is grossly normal. Lungs/Pleura: Stable advanced emphysematous changes and pulmonary scarring. Stable radiation changes involving the left upper lobe no findings for recurrent tumor. No new pulmonary nodules. No pleural effusions or pleural nodules. Upper Abdomen: Stable central right hepatic lobe lesion which measures 2.9 x 2.6 cm. Stable 13 mm lesion in segment 6 near the right kidney. No new hepatic lesions. No adrenal gland lesions. Musculoskeletal: No significant bony findings. There is a stable lucent lesion in the T11 vertebral body with smooth corticated margins. No new bone lesions. Review of the MIP images confirms the above findings. IMPRESSION: 1. No CT findings for pulmonary embolism. 2. Stable mild fusiform aneurysmal dilatation of the ascending thoracic aorta with maximum measurement of 4.1 cm. No dissection. Recommend continued observation. 3. Stable radiation changes involving the left upper lobe. No findings for recurrent tumor. 4. Stable advanced emphysematous changes and pulmonary scarring. No new pulmonary nodules. 5. Stable hepatic lesions.  No new or progressive findings. 6. Emphysema and aortic atherosclerosis. Aortic Atherosclerosis (ICD10-I70.0) and Emphysema (ICD10-J43.9). Electronically Signed   By: Marijo Sanes M.D.   On: 07/24/2019 15:45   US Venous Img Lower Bilateral (DVT)  Result Date: 08/02/2019 CLINICAL DATA:  Bilateral lower extremity pain and edema for the past 2 weeks. History of smoking. History of lung cancer. Evaluate for DVT. EXAM: BILATERAL LOWER EXTREMITY VENOUS DOPPLER ULTRASOUND TECHNIQUE: Gray-scale sonography with graded compression, as well as color Doppler and duplex ultrasound were performed to evaluate the lower extremity deep venous systems from the level of the common femoral vein and including the common femoral, femoral, profunda femoral, popliteal and calf veins  including the posterior tibial, peroneal and gastrocnemius veins when visible. The superficial great saphenous vein was also interrogated. Spectral Doppler was utilized to evaluate flow at rest and with distal augmentation maneuvers in the common femoral, femoral and popliteal veins. COMPARISON:  None. FINDINGS: RIGHT LOWER EXTREMITY Common Femoral Vein: No evidence of thrombus. Normal compressibility, respiratory phasicity and response to augmentation. Saphenofemoral Junction: No evidence of thrombus. Normal compressibility and flow on color Doppler imaging. Profunda Femoral Vein: No evidence of thrombus. Normal compressibility and flow on color Doppler imaging. Femoral Vein: No evidence of thrombus. Normal compressibility, respiratory phasicity and response to augmentation. Popliteal Vein: No evidence of thrombus. Normal compressibility, respiratory phasicity and response to augmentation. Calf Veins: No evidence of thrombus. Normal compressibility and flow on color Doppler imaging. Superficial Great Saphenous Vein: No evidence of thrombus. Normal compressibility. Venous Reflux:  None. Other Findings:  None. LEFT LOWER EXTREMITY Common Femoral Vein: No evidence of thrombus. Normal compressibility, respiratory phasicity and response to augmentation. Saphenofemoral Junction: No evidence of thrombus. Normal compressibility and flow on color Doppler imaging. Profunda Femoral Vein: No evidence of thrombus. Normal compressibility and flow on color Doppler imaging. Femoral Vein: No evidence of thrombus. Normal compressibility, respiratory phasicity and response to augmentation. Popliteal Vein: No evidence of thrombus. Normal compressibility, respiratory phasicity and response to augmentation. Calf Veins: No evidence of thrombus. Normal compressibility and flow on color Doppler imaging. Superficial Great Saphenous Vein: No evidence of thrombus. Normal compressibility. Venous Reflux:  None. Other Findings:  None.  IMPRESSION: No evidence of DVT within either lower extremity. Electronically Signed   By: Sandi Mariscal M.D.   On: 08/02/2019 10:36    PERFORMANCE STATUS (ECOG) : 1 - Symptomatic but completely ambulatory  Review of Systems Unless otherwise noted,  a complete review of systems is negative.  Physical Exam General: NAD Pulmonary: unlabored Extremities: no edema, no joint deformities Skin: no rashes Neurological: Weakness but otherwise nonfocal  IMPRESSION: Routine follow-up visit.  Patient was seen in the infusion area.  Patient reports that overall he feels he is doing well.  He denies any significant symptomatic complaints today.  He does have chronic peripheral neuropathy likely from the carboplatin.  He continues to use fentanyl/oxycodone.  Patient was also started on gabapentin 300 mg 2 times daily and feels that that has helped.  Could consider 3 times daily dosing if needed.  Patient has chronic fatigue but says that he remains functionally independent at home.  He says he plans to mow his grass on the [employer].  I have previously addressed completion of a MOST form.  We will readdress at time of future clinic visit.  PLAN: -Continue current scope of treatment -Plan to readdress MOST form at time of future clinic visit -RTC in 3 weeks   Patient expressed understanding and was in agreement with this plan. He also understands that He can call the clinic at any time with any questions, concerns, or complaints.     Time Total: 15 minutes  Visit consisted of counseling and education dealing with the complex and emotionally intense issues of symptom management and palliative care in the setting of serious and potentially life-threatening illness.Greater than 50%  of this time was spent counseling and coordinating care related to the above assessment and plan.  Signed by: Altha Harm, PhD, NP-C

## 2019-08-15 NOTE — Progress Notes (Signed)
HR 106, ok to treat per MD

## 2019-08-15 NOTE — Progress Notes (Signed)
Patient here for follow up. Pt reports that his neuropathy is "a little better" since he started taking gabapentin. Pt in not taking megace because he is eating good. Pt saw cardio this past Monday.

## 2019-08-15 NOTE — Progress Notes (Signed)
B/P 122/97, HR 106 Per Benjamine Mola RN per Dr. Tasia Catchings okay to proceed with treatment as scheduled Gildardo Pounds, [place], Taxol, and Carboplatin). Taxol dose reduced per Benjamine Mola RN per Dr. Tasia Catchings.

## 2019-08-29 ENCOUNTER — Telehealth: Payer: Medicare Other | Admitting: Hospice and Palliative Medicine

## 2019-08-30 ENCOUNTER — Telehealth: Payer: Self-pay

## 2019-08-30 NOTE — Telephone Encounter (Signed)
Nutrition Follow-up:  Patient identified on Malnutrition Screening report for weight loss and poor appetite.   Last seen by RD on 01/2019 and at that time weight was increasing.   68 year old male with stage IV adenocarcinoma of lung with mets to liver, bone and chest wall as well as stage IV HCC.  Patient receiving carboplatin, tecentriq and zirabev.    Spoke with patient via phone.  Patient reports that his biggest issue now is pain in feet and hands.  MD is aware.  Reports that he has trouble at times picking food up.  "It ends up in the floor sometimes." Reports for breakfast having cereal or ham and eggs or sausage biscuit.  Lunch yesterday was 2 hamburgers from [employer].  Did not eat supper as lunch was not until 3pm.  Also has 2 raisin cakes.  Drinking 2 ensure/boost shakes per day.  Denies nausea. Bowels moving daily.      Medications: not taking megace  Labs: reviewed  Anthropometrics:   Weight 136 lb 6/30 decreased from 148 lb in 03/2019.    8% weight loss in 4 months, concerning   NUTRITION DIAGNOSIS: Inadequate oral intake related to cancer related treatment side effects as evidenced by 8% weight loss in 4 months   INTERVENTION:  Reviewed strategies to increase calories and protein.  Encourage oral nutrition supplement 2-3 times per day.  Will provide complimentary case of ensure enlive for pick up on 7/21, next appointment. Patient has contact information    MONITORING, EVALUATION, GOAL: weight trends, intake   NEXT VISIT: August 19, phone f/u  Samuel Hood, [place], [place] Registered Dietitian [phone] (pager)

## 2019-09-05 ENCOUNTER — Inpatient Hospital Stay: Payer: Medicare Other | Attending: Oncology

## 2019-09-05 ENCOUNTER — Other Ambulatory Visit: Payer: Self-pay

## 2019-09-05 ENCOUNTER — Encounter: Payer: Self-pay | Admitting: Oncology

## 2019-09-05 ENCOUNTER — Inpatient Hospital Stay: Payer: Medicare Other | Admitting: Hospice and Palliative Medicine

## 2019-09-05 ENCOUNTER — Inpatient Hospital Stay: Payer: Medicare Other

## 2019-09-05 ENCOUNTER — Inpatient Hospital Stay (HOSPITAL_BASED_OUTPATIENT_CLINIC_OR_DEPARTMENT_OTHER): Payer: Medicare Other | Admitting: Oncology

## 2019-09-05 VITALS — BP 134/65 | HR 112 | Temp 96.7°F | Resp 16 | Wt 132.1 lb

## 2019-09-05 DIAGNOSIS — Z5111 Encounter for antineoplastic chemotherapy: Secondary | ICD-10-CM

## 2019-09-05 DIAGNOSIS — R634 Abnormal weight loss: Secondary | ICD-10-CM | POA: Insufficient documentation

## 2019-09-05 DIAGNOSIS — C7802 Secondary malignant neoplasm of left lung: Secondary | ICD-10-CM

## 2019-09-05 DIAGNOSIS — Z809 Family history of malignant neoplasm, unspecified: Secondary | ICD-10-CM | POA: Insufficient documentation

## 2019-09-05 DIAGNOSIS — B1920 Unspecified viral hepatitis C without hepatic coma: Secondary | ICD-10-CM | POA: Insufficient documentation

## 2019-09-05 DIAGNOSIS — C220 Liver cell carcinoma: Secondary | ICD-10-CM

## 2019-09-05 DIAGNOSIS — T451X5A Adverse effect of antineoplastic and immunosuppressive drugs, initial encounter: Secondary | ICD-10-CM | POA: Insufficient documentation

## 2019-09-05 DIAGNOSIS — G893 Neoplasm related pain (acute) (chronic): Secondary | ICD-10-CM

## 2019-09-05 DIAGNOSIS — Z5112 Encounter for antineoplastic immunotherapy: Secondary | ICD-10-CM | POA: Insufficient documentation

## 2019-09-05 DIAGNOSIS — G620 Drug-induced polyneuropathy: Secondary | ICD-10-CM

## 2019-09-05 DIAGNOSIS — C7951 Secondary malignant neoplasm of bone: Secondary | ICD-10-CM

## 2019-09-05 DIAGNOSIS — J984 Other disorders of lung: Secondary | ICD-10-CM | POA: Insufficient documentation

## 2019-09-05 DIAGNOSIS — C3492 Malignant neoplasm of unspecified part of left bronchus or lung: Secondary | ICD-10-CM | POA: Diagnosis not present

## 2019-09-05 DIAGNOSIS — I10 Essential (primary) hypertension: Secondary | ICD-10-CM | POA: Diagnosis not present

## 2019-09-05 DIAGNOSIS — N281 Cyst of kidney, acquired: Secondary | ICD-10-CM | POA: Insufficient documentation

## 2019-09-05 DIAGNOSIS — Z79899 Other long term (current) drug therapy: Secondary | ICD-10-CM | POA: Diagnosis not present

## 2019-09-05 DIAGNOSIS — Z87891 Personal history of nicotine dependence: Secondary | ICD-10-CM | POA: Diagnosis not present

## 2019-09-05 DIAGNOSIS — J439 Emphysema, unspecified: Secondary | ICD-10-CM | POA: Insufficient documentation

## 2019-09-05 DIAGNOSIS — Z125 Encounter for screening for malignant neoplasm of prostate: Secondary | ICD-10-CM

## 2019-09-05 DIAGNOSIS — Z7952 Long term (current) use of systemic steroids: Secondary | ICD-10-CM | POA: Insufficient documentation

## 2019-09-05 DIAGNOSIS — R000 Tachycardia, unspecified: Secondary | ICD-10-CM | POA: Diagnosis not present

## 2019-09-05 DIAGNOSIS — I712 Thoracic aortic aneurysm, without rupture: Secondary | ICD-10-CM | POA: Insufficient documentation

## 2019-09-05 DIAGNOSIS — E876 Hypokalemia: Secondary | ICD-10-CM | POA: Diagnosis not present

## 2019-09-05 DIAGNOSIS — J948 Other specified pleural conditions: Secondary | ICD-10-CM | POA: Diagnosis not present

## 2019-09-05 DIAGNOSIS — K8020 Calculus of gallbladder without cholecystitis without obstruction: Secondary | ICD-10-CM | POA: Diagnosis not present

## 2019-09-05 DIAGNOSIS — R600 Localized edema: Secondary | ICD-10-CM | POA: Diagnosis not present

## 2019-09-05 DIAGNOSIS — I700 Atherosclerosis of aorta: Secondary | ICD-10-CM | POA: Insufficient documentation

## 2019-09-05 LAB — CBC WITH DIFFERENTIAL/PLATELET
Abs Immature Granulocytes: 0.07 10*3/uL (ref 0.00–0.07)
Basophils Absolute: 0 10*3/uL (ref 0.0–0.1)
Basophils Relative: 1 %
Eosinophils Absolute: 0.1 10*3/uL (ref 0.0–0.5)
Eosinophils Relative: 1 %
HCT: 36.7 % — ABNORMAL LOW (ref 39.0–52.0)
Hemoglobin: 12 g/dL — ABNORMAL LOW (ref 13.0–17.0)
Immature Granulocytes: 2 %
Lymphocytes Relative: 25 %
Lymphs Abs: 1.2 10*3/uL (ref 0.7–4.0)
MCH: 28.6 pg (ref 26.0–34.0)
MCHC: 32.7 g/dL (ref 30.0–36.0)
MCV: 87.6 fL (ref 80.0–100.0)
Monocytes Absolute: 0.8 10*3/uL (ref 0.1–1.0)
Monocytes Relative: 16 %
Neutro Abs: 2.7 10*3/uL (ref 1.7–7.7)
Neutrophils Relative %: 55 %
Platelets: 278 10*3/uL (ref 150–400)
RBC: 4.19 MIL/uL — ABNORMAL LOW (ref 4.22–5.81)
RDW: 19 % — ABNORMAL HIGH (ref 11.5–15.5)
WBC: 4.8 10*3/uL (ref 4.0–10.5)
nRBC: 0 % (ref 0.0–0.2)

## 2019-09-05 LAB — COMPREHENSIVE METABOLIC PANEL
ALT: 45 U/L — ABNORMAL HIGH (ref 0–44)
AST: 57 U/L — ABNORMAL HIGH (ref 15–41)
Albumin: 3.3 g/dL — ABNORMAL LOW (ref 3.5–5.0)
Alkaline Phosphatase: 72 U/L (ref 38–126)
Anion gap: 9 (ref 5–15)
BUN: 9 mg/dL (ref 8–23)
CO2: 25 mmol/L (ref 22–32)
Calcium: 8.9 mg/dL (ref 8.9–10.3)
Chloride: 107 mmol/L (ref 98–111)
Creatinine, Ser: 0.61 mg/dL (ref 0.61–1.24)
GFR calc Af Amer: >60 mL/min (ref >60)
GFR calc non Af Amer: >60 mL/min (ref >60)
Glucose, Bld: 132 mg/dL — ABNORMAL HIGH (ref 70–99)
Potassium: 3.4 mmol/L — ABNORMAL LOW (ref 3.5–5.1)
Sodium: 141 mmol/L (ref 135–145)
Total Bilirubin: 0.5 mg/dL (ref 0.3–1.2)
Total Protein: 6.8 g/dL (ref 6.5–8.1)

## 2019-09-05 LAB — PROTEIN, URINE, RANDOM: Total Protein, Urine: 18 mg/dL

## 2019-09-05 LAB — MAGNESIUM: Magnesium: 1.5 mg/dL — ABNORMAL LOW (ref 1.7–2.4)

## 2019-09-05 MED ORDER — DEXAMETHASONE 4 MG PO TABS: 8.0000 mg | ORAL_TABLET | Freq: Every day | ORAL | 1 refills | Status: DC

## 2019-09-05 MED ORDER — GABAPENTIN 300 MG PO CAPS: 300.0000 mg | ORAL_CAPSULE | Freq: Four times a day (QID) | ORAL | 0 refills | Status: DC

## 2019-09-05 MED ORDER — MAGNESIUM CHLORIDE 64 MG PO TBEC: 1.0000 | DELAYED_RELEASE_TABLET | Freq: Every day | ORAL | Status: DC

## 2019-09-05 MED ORDER — HEPARIN SOD (PORK) LOCK FLUSH 100 UNIT/ML IV SOLN
500.0000 [IU] | Freq: Once | INTRAVENOUS | Status: AC | PRN
  Administered 2019-09-05: 500 [IU]
  Filled 2019-09-05: qty 5

## 2019-09-05 MED ORDER — HEPARIN SOD (PORK) LOCK FLUSH 100 UNIT/ML IV SOLN
INTRAVENOUS | Status: AC
Start: 2019-09-05 — End: ?
  Filled 2019-09-05: qty 5

## 2019-09-05 MED ORDER — MAGNESIUM CHLORIDE 64 MG PO TBEC: 1.0000 | DELAYED_RELEASE_TABLET | Freq: Every day | ORAL | 0 refills | Status: DC

## 2019-09-05 MED ORDER — SODIUM CHLORIDE 0.9 % IV SOLN
15.0000 mg/kg | Freq: Once | INTRAVENOUS | Status: AC
  Administered 2019-09-05: 1000 mg via INTRAVENOUS
  Filled 2019-09-05: qty 32

## 2019-09-05 MED ORDER — SODIUM CHLORIDE 0.9% FLUSH
10.0000 mL | Freq: Once | INTRAVENOUS | Status: AC
  Administered 2019-09-05: 10 mL via INTRAVENOUS
  Filled 2019-09-05: qty 10

## 2019-09-05 MED ORDER — SODIUM CHLORIDE 0.9 % IV SOLN
Freq: Once | INTRAVENOUS | Status: AC
  Filled 2019-09-05: qty 250

## 2019-09-05 MED ORDER — SODIUM CHLORIDE 0.9 % IV SOLN
1200.0000 mg | Freq: Once | INTRAVENOUS | Status: AC
  Administered 2019-09-05: 1200 mg via INTRAVENOUS
  Filled 2019-09-05: qty 20

## 2019-09-05 NOTE — Progress Notes (Signed)
Hematology/Oncology follow up note [facility] [phone] [phone]  Reassurance Patient Care Team: Gilbert, Richard L Jr., MD as PCP - General (Family Medicine) Agbor-Etang, Brian, MD as PCP - Cardiology (Cardiology) Rhode, Hayley, RN as Registered Nurse Stanton, Kristi D, RN as Registered Nurse  REFERRING PROVIDER: Gilbert, Richard L Jr.,*  CHIEF COMPLAINTS/REASON FOR VISIT:  metastatic cancer management.  HISTORY OF PRESENTING ILLNESS:   Samuel D Wildrick Jr. is a  69 y.o.  male with PMH listed below was seen in consultation at the request of  Gilbert, Richard L Jr.,*  for evaluation of lung nodule in the liver lesion. Patient has a past medical history of hypertension, alcohol abuse, smoking emphysema.  He has had serial CTs done in the past.  CT images were independently reviewed by me. 07/26/2017 CT chest with contrast showed extensive pleural-parenchymal scarring within the left upper lobe with several areas of soft tissue nodularity measuring up to 1.6 cm.  In this patient who is at increased risk of lung cancer further investigation with PET scan is recommended. Small chronic appearance loculated hydropneumothorax overlies the left apex. Slowly enlarging lesion within the central portion of the right lobe of liver identified. Per note, patient supposed to have additional work-up done in December repeat CT scan which he did not  Patient was recently seen by primary care provider and has subsequent image done for follow-up. CT on 06/30/2018 showed multiple significantly enlarged paraspinal mass are noted concerning.  The largest measures 3.8 x 0.5 cm and there appears to be a lytic destruction of the adjacent T11 vertebral body.  Also noted is new solid density measuring 17 x 12 mm in the pleural parenchymal density in the left upper lobe noted on prior exam.  Concerning for malignancy.  4.4 ascending thoracic aortic aneurysm.  Recommend annual  imaging.  Patient had PET scan done on 07/06/2018 Which showed there are 2 FDG avid lesion within the left upper lobe worrisome for primary lung neoplasm.  There is evidence of chest wall involvement.  Metastasis to the posterior mediastinum is identified with extension into T11 vertebra and possible involvement of the left T12 neural foramina.  Patient reports left shoulder pain.  Smoking half a pack a day not motivated with further questioning quitting Denies weight loss, hemoptysis, cough.  Chronic shortness of breath with exertion. He drinks alcohol daily. Lives with his girlfriend.  He has 5 adult kids  #Hepatitis C  # 6/1?2020  CT-guided biopsy of left-sided posterior mediastinal soft tissue adjacent to T10/11. Patient's case was discussed on tumor board. Consensus was reached that HCC versus primary lung cancer with hepatoid adenocarcinoma features. Consensus was to proceed with liver biopsy first as liver mass was not FDG avid on PET scan.  Patient underwent liver biopsy and the present to discuss pathology reports.  ## 08/08/2018 Liver mass biopsy showed fragments of necrotic and calcified tissue with adjacent fibrous capsule Scant viable nonneoplastic liver tissue with steatosis, inflammation and non specific fibrosis.  # 08/28/2018 CT guided left upper lobe lung mass biopsy showed poorly differentiated adenocarcinoma of lung origin.  6/19-08/16/2018  Finished palliative radiation to paraspinal soft tissue mass. 09/07/2018 patient was started on lenvatinib 12 mg daily. 10/13/2018 SBRT to lung cancer lesion.  #Patient is on Zometa monthly #He was advised to take calcium supplement.  He reports he quit taking calcium as he broke out rash on her scalp, neck and chest after taking calcium. # 03/30/19 liver lesion biopsy showed poorly   differentiated adenocarcinoma, compatible with lung primary.  INTERVAL HISTORY Samuel Sookdeo. is a 69 y.o. male who has above history reviewed by me today  presents for evaluation prior to chemotherapy for lung cancer and HCC. Patient reports feeling well except that he feels that neuropathy of his hands and feet are getting worse. Patient is on gabapentin 3 times daily with some symptom relief. Echo is scheduled at the end of July.  Appetite is fair. Lost 4 pounds since last visit.    Review of Systems  Constitutional: Positive for fatigue. Negative for appetite change, chills, fever and unexpected weight change.  HENT:   Negative for hearing loss and voice change.   Eyes: Negative for eye problems and icterus.  Respiratory: Negative for chest tightness, cough and shortness of breath.   Cardiovascular: Negative for chest pain and leg swelling.  Gastrointestinal: Negative for abdominal distention, abdominal pain and constipation.  Endocrine: Negative for hot flashes.  Genitourinary: Negative for difficulty urinating, dysuria and frequency.   Musculoskeletal: Negative for arthralgias.  Skin: Negative for itching and rash.  Neurological: Positive for numbness. Negative for light-headedness.  Hematological: Negative for adenopathy. Does not bruise/bleed easily.  Psychiatric/Behavioral: Negative for confusion.    MEDICAL HISTORY:  Past Medical History:  Diagnosis Date  . Hepatocellular carcinoma metastatic to left lung ([place]) 07/24/2018  . History of angiography    left lower extremity  . Hypertension   . Small bowel obstruction ([place])     SURGICAL HISTORY: Past Surgical History:  Procedure Laterality Date  . COLON SURGERY    . COLONOSCOPY W/ POLYPECTOMY    . COLONOSCOPY WITH PROPOFOL N/A 01/31/2018   Procedure: COLONOSCOPY WITH PROPOFOL;  Surgeon: Toledo, Benay Pike, MD;  Location: [facility] ENDOSCOPY;  Service: Gastroenterology;  Laterality: N/A;  . debridement fasciotomy leg, left Left 03/19/2016  . LAPAROTOMY N/A 09/27/2014   Procedure: EXPLORATORY LAPAROTOMY;  Surgeon: Dia Crawford III, MD;  Location: [facility] ORS;  Service: General;   Laterality: N/A;  . PORTA CATH INSERTION N/A 04/10/2019   Procedure: PORTA CATH INSERTION;  Surgeon: Katha Cabal, MD;  Location: [place] [facility];  Service: Cardiovascular;  Laterality: N/A;    SOCIAL HISTORY: Social History   Socioeconomic History  . Marital status: Single    Spouse name: Not on file  . Number of children: Not on file  . Years of education: Not on file  . Highest education level: Not on file  Occupational History  . Occupation: retired  Tobacco Use  . Smoking status: Former Smoker    Packs/day: 0.25    Years: 50.00    Pack years: 12.50  . Smokeless tobacco: Never Used  . Tobacco comment: 1-2/week  Vaping Use  . Vaping Use: Never used  Substance and Sexual Activity  . Alcohol use: Not Currently    Alcohol/week: 35.0 standard drinks    Types: 30 Cans of beer, 5 Shots of liquor per week  . Drug use: No  . Sexual activity: Not on file  Other Topics Concern  . Not on file  Social History Narrative  . Not on file   Social Determinants of Health   Financial Resource Strain:   . Difficulty of Paying Living Expenses:   Food Insecurity:   . Worried About Charity fundraiser in the Last Year:   . Arboriculturist in the Last Year:   Transportation Needs:   . Film/video editor (Medical):   Marland Kitchen Lack of Transportation (Non-Medical):  Physical Activity:   . Days of Exercise per Week:   . Minutes of Exercise per Session:   Stress:   . Feeling of Stress :   Social Connections:   . Frequency of Communication with Friends and Family:   . Frequency of Social Gatherings with Friends and Family:   . Attends Religious Services:   . Active Member of Clubs or Organizations:   . Attends Archivist Meetings:   Marland Kitchen Marital Status:   Intimate Partner Violence:   . Fear of Current or Ex-Partner:   . Emotionally Abused:   Marland Kitchen Physically Abused:   . Sexually Abused:     FAMILY HISTORY: Family History  Problem Relation Age of Onset  . Cancer  Mother   . Cancer Father     ALLERGIES:  is allergic to 5-alpha reductase inhibitors.  MEDICATIONS:  Current Outpatient Medications  Medication Sig Dispense Refill  . dexamethasone (DECADRON) 4 MG tablet Take 2 tablets (8 mg total) by mouth daily. Start the day after carboplatin chemotherapy for 3 days. 30 tablet 1  . fentaNYL (DURAGESIC) 25 MCG/HR Place 1 patch onto the skin every 3 (three) days. 10 patch 0  . gabapentin (NEURONTIN) 300 MG capsule Take 1 capsule (300 mg total) by mouth 2 (two) times daily. 60 capsule 0  . lidocaine-prilocaine (EMLA) cream Apply to affected area once (Patient taking differently: Apply 1 application topically daily as needed (port acess). ) 30 g 3  . loperamide (IMODIUM) 2 MG capsule Take 1 capsule (2 mg total) by mouth See admin instructions. With onset of loose stool, take '4mg'$  followed by '2mg'$  every 2 hours until 12 hours have passed without loose bowel movement. Maximum: 16 mg/day (Patient not taking: Reported on 08/15/2019) 60 capsule 1  . megestrol (MEGACE) 40 MG tablet Take 1 tablet (40 mg total) by mouth 2 (two) times daily. (Patient not taking: Reported on 08/15/2019) 60 tablet 0  . Multiple Vitamin (MULTIVITAMIN) capsule Take 1 capsule by mouth daily.    Marland Kitchen NARCAN 4 MG/0.1ML LIQD nasal spray kit 1 spray once.  (Patient not taking: Reported on 08/15/2019)    . ondansetron (ZOFRAN) 8 MG tablet Take 1 tablet (8 mg total) by mouth 2 (two) times daily as needed for refractory nausea / vomiting. Start on day 3 after carboplatin chemo. 30 tablet 1  . oxyCODONE (ROXICODONE) 5 MG immediate release tablet Take 1 tablet (5 mg total) by mouth every 8 (eight) hours as needed for severe pain or breakthrough pain. 60 tablet 0  . polyethylene glycol (MIRALAX / GLYCOLAX) 17 g packet Take 17 g by mouth daily. 14 each 0  . potassium chloride (KLOR-CON) 10 MEQ tablet Take 1 tablet (10 mEq total) by mouth daily. 60 tablet 0  . potassium chloride SA (KLOR-CON M20) 20 MEQ tablet  Take 1 tablet (20 mEq total) by mouth daily. 30 tablet 0  . prochlorperazine (COMPAZINE) 10 MG tablet Take 1 tablet (10 mg total) by mouth every 6 (six) hours as needed (Nausea or vomiting). 30 tablet 1  . senna (SENOKOT) 8.6 MG TABS tablet Take 1 tablet (8.6 mg total) by mouth daily. 120 tablet 0   No current facility-administered medications for this visit.   Facility-Administered Medications Ordered in Other Visits  Medication Dose Route Frequency Provider Last Rate Last Admin  . sodium chloride flush (NS) 0.9 % injection 10 mL  10 mL Intravenous Once Earlie Server, MD         PHYSICAL EXAMINATION: ECOG  PERFORMANCE STATUS: 1 - Symptomatic but completely ambulatory Vitals:   09/05/19 0841  BP: 134/65  Pulse: (!) 112  Resp: 16  Temp: (!) 96.7 F (35.9 C)   Filed Weights   09/05/19 0841  Weight: 132 lb 1.6 oz (59.9 kg)    Physical Exam Constitutional:      General: He is not in acute distress.    Appearance: He is not ill-appearing.  HENT:     Head: Normocephalic and atraumatic.  Eyes:     General: No scleral icterus. Cardiovascular:     Rate and Rhythm: Regular rhythm. Tachycardia present.     Heart sounds: Normal heart sounds.  Pulmonary:     Effort: Pulmonary effort is normal. No respiratory distress.     Breath sounds: No wheezing or rales.  Abdominal:     General: Bowel sounds are normal. There is no distension.     Palpations: Abdomen is soft.  Musculoskeletal:        General: No deformity. Normal range of motion.     Cervical back: Normal range of motion and neck supple.  Skin:    General: Skin is warm and dry.     Findings: No erythema or rash.  Neurological:     Mental Status: He is alert and oriented to person, place, and time. Mental status is at baseline.     Cranial Nerves: No cranial nerve deficit.     Coordination: Coordination normal.  Psychiatric:        Mood and Affect: Mood normal.     LABORATORY DATA:  I have reviewed the data as listed Lab  Results  Component Value Date   WBC 6.3 08/15/2019   HGB 10.5 (L) 08/15/2019   HCT 31.8 (L) 08/15/2019   MCV 88.3 08/15/2019   PLT 272 08/15/2019   Recent Labs    07/24/19 1243 08/08/19 0819 08/15/19 0817  NA 136 141 137  K 3.7 2.8* 3.7  CL 101 105 103  CO2 '25 26 25  '$ GLUCOSE 173* 112* 131*  BUN 16 8 5*  CREATININE 0.59* 0.52* 0.53*  CALCIUM 9.2 8.8* 8.6*  GFRNONAA >60 >60 >60  GFRAA >60 >60 >60  PROT 7.0 6.9 6.8  ALBUMIN 3.1* 3.2* 3.0*  AST 55* 43* 41  ALT 54* 40 35  ALKPHOS 62 75 72  BILITOT 0.8 0.6 0.5   Iron/TIBC/Ferritin/ %Sat No results found for: IRON, TIBC, FERRITIN, IRONPCTSAT   RADIOGRAPHIC STUDIES: I have personally reviewed the radiological images as listed and agreed with the findings in the report. CT Chest W Contrast  Result Date: 07/04/2019 CLINICAL DATA:  Stage IV adenocarcinoma of the lung. EXAM: CT CHEST, ABDOMEN, AND PELVIS WITH CONTRAST TECHNIQUE: Multidetector CT imaging of the chest, abdomen and pelvis was performed following the standard protocol during bolus administration of intravenous contrast. CONTRAST:  23m OMNIPAQUE IOHEXOL 300 MG/ML  SOLN COMPARISON:  03/26/2019 FINDINGS: CT CHEST FINDINGS Cardiovascular: The heart size is normal. No substantial pericardial effusion. Coronary artery calcification is evident. Atherosclerotic calcification is noted in the wall of the thoracic aorta. Ascending thoracic aorta measures 4.1 cm diameter. Right-sided Port-A-Cath is looped in the right jugular vein with the tip directed centrally at the innominate vein confluence. Mediastinum/Nodes: No mediastinal lymphadenopathy. There is no hilar lymphadenopathy. There is no axillary lymphadenopathy. The esophagus has normal imaging features. Lungs/Pleura: Centrilobular and paraseptal emphysema evident. Bullous change noted in the apices, left greater than right. Architectural distortion with patchy ill-defined consolidative opacity in the anterior left upper  lobe is  similar to prior and compatible with sequelae of prior therapy. No new suspicious pulmonary nodule or mass. No pleural effusion. Musculoskeletal: Lucent lesion in the T11 vertebral body is stable. Small focus of left paraspinal soft tissue at T11 is unchanged. CT ABDOMEN PELVIS FINDINGS Hepatobiliary: Central liver mass (segment VIII) is stable at 2.9 x 2.8 cm today compared to 3.1 x 2.9 cm previously. Smaller immediately adjacent lesion measures 1.3 x 1.4 cm today (59/2) compared to 1.5 x 1.5 cm previously. Ill-defined hypoenhancing lesion posterior right liver (66/2) measures 1.5 x 1.5 cm today compared to 2.2 x 2.4 cm previously. A very subtle 7 mm hypoattenuating lesion in the posterior right liver identified on the previous study is not discernible on today's exam. Calcified gallstones again noted. No intrahepatic or extrahepatic biliary dilation. Pancreas: No focal mass lesion. No dilatation of the main duct. No intraparenchymal cyst. No peripancreatic edema. Spleen: No splenomegaly. No focal mass lesion. Adrenals/Urinary Tract: No adrenal nodule or mass. Small right renal cysts are stable. Left kidney unremarkable. No evidence for hydroureter. The urinary bladder appears normal for the degree of distention. Stomach/Bowel: Stomach is unremarkable. No gastric wall thickening. No evidence of outlet obstruction. Duodenum is normally positioned as is the ligament of Treitz. No small bowel wall thickening. No small bowel dilatation. The terminal ileum is normal. The appendix is not visualized, but there is no edema or inflammation in the region of the cecum. Left colon anastomosis again noted. Vascular/Lymphatic: There is abdominal aortic atherosclerosis without aneurysm. There is no gastrohepatic or hepatoduodenal ligament lymphadenopathy. No retroperitoneal or mesenteric lymphadenopathy. No pelvic sidewall lymphadenopathy. Reproductive: The prostate gland and seminal vesicles are unremarkable. Other: No  intraperitoneal free fluid. Musculoskeletal: No worrisome lytic or sclerotic osseous abnormality. IMPRESSION: 1. No new or progressive findings in the chest, abdomen, or pelvis. 2. Hepatic metastases have decreased in the interval and a very subtle 7 mm hypoattenuating lesion in the posterior right liver identified on the previous study is not discernible on today's exam. 3. Stable appearance of the lucent lesion in the T11 vertebral body with no change in the small focus of adjacent left paraspinal soft tissue attenuation. 4. The right-sided Port-A-Cath is looped in the right internal jugular vein with the tip directed centrally at the level of the innominate vein confluence. 5. Cholelithiasis. 6. Ascending thoracic aortic aneurysm at 4.1 cm diameter. Continued attention on follow-up recommended. 7. Aortic Atherosclerosis (ICD10-I70.0) and Emphysema (ICD10-J43.9). Electronically Signed   By: Kennith Center M.D.   On: 07/04/2019 09:07   CT ANGIO CHEST PE W OR WO CONTRAST  Result Date: 07/24/2019 CLINICAL DATA:  Chest pain and shortness of breath. History of stage IV adenocarcinoma of the left lung. EXAM: CT ANGIOGRAPHY CHEST WITH CONTRAST TECHNIQUE: Multidetector CT imaging of the chest was performed using the standard protocol during bolus administration of intravenous contrast. Multiplanar CT image reconstructions and MIPs were obtained to evaluate the vascular anatomy. CONTRAST:  66mL OMNIPAQUE IOHEXOL 350 MG/ML SOLN COMPARISON:  Chest CT 07/04/2019 and CT angio chest 03/26/2019 FINDINGS: Cardiovascular: The heart is normal in size. No pericardial effusion. Stable mild fusiform aneurysmal dilatation of the ascending thoracic aorta with maximum measurement of 4.1 cm. No dissection. Stable atherosclerotic calcifications at the aortic arch. Stable scattered coronary artery calcifications. The pulmonary arterial tree is well opacified. No filling defects to suggest pulmonary embolism. Mediastinum/Nodes: No  mediastinal or hilar mass or adenopathy. Small scattered lymph nodes are stable. The esophagus is grossly normal. Lungs/Pleura: Stable  advanced emphysematous changes and pulmonary scarring. Stable radiation changes involving the left upper lobe no findings for recurrent tumor. No new pulmonary nodules. No pleural effusions or pleural nodules. Upper Abdomen: Stable central right hepatic lobe lesion which measures 2.9 x 2.6 cm. Stable 13 mm lesion in segment 6 near the right kidney. No new hepatic lesions. No adrenal gland lesions. Musculoskeletal: No significant bony findings. There is a stable lucent lesion in the T11 vertebral body with smooth corticated margins. No new bone lesions. Review of the MIP images confirms the above findings. IMPRESSION: 1. No CT findings for pulmonary embolism. 2. Stable mild fusiform aneurysmal dilatation of the ascending thoracic aorta with maximum measurement of 4.1 cm. No dissection. Recommend continued observation. 3. Stable radiation changes involving the left upper lobe. No findings for recurrent tumor. 4. Stable advanced emphysematous changes and pulmonary scarring. No new pulmonary nodules. 5. Stable hepatic lesions.  No new or progressive findings. 6. Emphysema and aortic atherosclerosis. Aortic Atherosclerosis (ICD10-I70.0) and Emphysema (ICD10-J43.9). Electronically Signed   By: Marijo Sanes M.D.   On: 07/24/2019 15:45   CT Abdomen Pelvis W Contrast  Result Date: 07/04/2019 CLINICAL DATA:  Stage IV adenocarcinoma of the lung. EXAM: CT CHEST, ABDOMEN, AND PELVIS WITH CONTRAST TECHNIQUE: Multidetector CT imaging of the chest, abdomen and pelvis was performed following the standard protocol during bolus administration of intravenous contrast. CONTRAST:  18m OMNIPAQUE IOHEXOL 300 MG/ML  SOLN COMPARISON:  03/26/2019 FINDINGS: CT CHEST FINDINGS Cardiovascular: The heart size is normal. No substantial pericardial effusion. Coronary artery calcification is evident.  Atherosclerotic calcification is noted in the wall of the thoracic aorta. Ascending thoracic aorta measures 4.1 cm diameter. Right-sided Port-A-Cath is looped in the right jugular vein with the tip directed centrally at the innominate vein confluence. Mediastinum/Nodes: No mediastinal lymphadenopathy. There is no hilar lymphadenopathy. There is no axillary lymphadenopathy. The esophagus has normal imaging features. Lungs/Pleura: Centrilobular and paraseptal emphysema evident. Bullous change noted in the apices, left greater than right. Architectural distortion with patchy ill-defined consolidative opacity in the anterior left upper lobe is similar to prior and compatible with sequelae of prior therapy. No new suspicious pulmonary nodule or mass. No pleural effusion. Musculoskeletal: Lucent lesion in the T11 vertebral body is stable. Small focus of left paraspinal soft tissue at T11 is unchanged. CT ABDOMEN PELVIS FINDINGS Hepatobiliary: Central liver mass (segment VIII) is stable at 2.9 x 2.8 cm today compared to 3.1 x 2.9 cm previously. Smaller immediately adjacent lesion measures 1.3 x 1.4 cm today (59/2) compared to 1.5 x 1.5 cm previously. Ill-defined hypoenhancing lesion posterior right liver (66/2) measures 1.5 x 1.5 cm today compared to 2.2 x 2.4 cm previously. A very subtle 7 mm hypoattenuating lesion in the posterior right liver identified on the previous study is not discernible on today's exam. Calcified gallstones again noted. No intrahepatic or extrahepatic biliary dilation. Pancreas: No focal mass lesion. No dilatation of the main duct. No intraparenchymal cyst. No peripancreatic edema. Spleen: No splenomegaly. No focal mass lesion. Adrenals/Urinary Tract: No adrenal nodule or mass. Small right renal cysts are stable. Left kidney unremarkable. No evidence for hydroureter. The urinary bladder appears normal for the degree of distention. Stomach/Bowel: Stomach is unremarkable. No gastric wall thickening.  No evidence of outlet obstruction. Duodenum is normally positioned as is the ligament of Treitz. No small bowel wall thickening. No small bowel dilatation. The terminal ileum is normal. The appendix is not visualized, but there is no edema or inflammation in the region of  the cecum. Left colon anastomosis again noted. Vascular/Lymphatic: There is abdominal aortic atherosclerosis without aneurysm. There is no gastrohepatic or hepatoduodenal ligament lymphadenopathy. No retroperitoneal or mesenteric lymphadenopathy. No pelvic sidewall lymphadenopathy. Reproductive: The prostate gland and seminal vesicles are unremarkable. Other: No intraperitoneal free fluid. Musculoskeletal: No worrisome lytic or sclerotic osseous abnormality. IMPRESSION: 1. No new or progressive findings in the chest, abdomen, or pelvis. 2. Hepatic metastases have decreased in the interval and a very subtle 7 mm hypoattenuating lesion in the posterior right liver identified on the previous study is not discernible on today's exam. 3. Stable appearance of the lucent lesion in the T11 vertebral body with no change in the small focus of adjacent left paraspinal soft tissue attenuation. 4. The right-sided Port-A-Cath is looped in the right internal jugular vein with the tip directed centrally at the level of the innominate vein confluence. 5. Cholelithiasis. 6. Ascending thoracic aortic aneurysm at 4.1 cm diameter. Continued attention on follow-up recommended. 7. Aortic Atherosclerosis (ICD10-I70.0) and Emphysema (ICD10-J43.9). Electronically Signed   By: Misty Stanley M.D.   On: 07/04/2019 09:07   US Venous Img Lower Bilateral (DVT)  Result Date: 08/02/2019 CLINICAL DATA:  Bilateral lower extremity pain and edema for the past 2 weeks. History of smoking. History of lung cancer. Evaluate for DVT. EXAM: BILATERAL LOWER EXTREMITY VENOUS DOPPLER ULTRASOUND TECHNIQUE: Gray-scale sonography with graded compression, as well as color Doppler and duplex  ultrasound were performed to evaluate the lower extremity deep venous systems from the level of the common femoral vein and including the common femoral, femoral, profunda femoral, popliteal and calf veins including the posterior tibial, peroneal and gastrocnemius veins when visible. The superficial great saphenous vein was also interrogated. Spectral Doppler was utilized to evaluate flow at rest and with distal augmentation maneuvers in the common femoral, femoral and popliteal veins. COMPARISON:  None. FINDINGS: RIGHT LOWER EXTREMITY Common Femoral Vein: No evidence of thrombus. Normal compressibility, respiratory phasicity and response to augmentation. Saphenofemoral Junction: No evidence of thrombus. Normal compressibility and flow on color Doppler imaging. Profunda Femoral Vein: No evidence of thrombus. Normal compressibility and flow on color Doppler imaging. Femoral Vein: No evidence of thrombus. Normal compressibility, respiratory phasicity and response to augmentation. Popliteal Vein: No evidence of thrombus. Normal compressibility, respiratory phasicity and response to augmentation. Calf Veins: No evidence of thrombus. Normal compressibility and flow on color Doppler imaging. Superficial Great Saphenous Vein: No evidence of thrombus. Normal compressibility. Venous Reflux:  None. Other Findings:  None. LEFT LOWER EXTREMITY Common Femoral Vein: No evidence of thrombus. Normal compressibility, respiratory phasicity and response to augmentation. Saphenofemoral Junction: No evidence of thrombus. Normal compressibility and flow on color Doppler imaging. Profunda Femoral Vein: No evidence of thrombus. Normal compressibility and flow on color Doppler imaging. Femoral Vein: No evidence of thrombus. Normal compressibility, respiratory phasicity and response to augmentation. Popliteal Vein: No evidence of thrombus. Normal compressibility, respiratory phasicity and response to augmentation. Calf Veins: No evidence of  thrombus. Normal compressibility and flow on color Doppler imaging. Superficial Great Saphenous Vein: No evidence of thrombus. Normal compressibility. Venous Reflux:  None. Other Findings:  None. IMPRESSION: No evidence of DVT within either lower extremity. Electronically Signed   By: Sandi Mariscal M.D.   On: 08/02/2019 10:36      ASSESSMENT & PLAN:  1. Encounter for antineoplastic chemotherapy   2. Stage IV adenocarcinoma of lung, left ([place])   3. Hepatocellular carcinoma metastatic to left lung ([place])   4. Cancer, metastatic to bone ([place])  5. Chemotherapy-induced neuropathy ([place])   6. Hypomagnesemia    #Stage IV Lung adenocarcinoma, Stage IV HCC-07/17/2018 paraspinal soft tissue biopsy Patient is on palliative chemotherapy with carboplatin, Taxol, bevacizumab, Tecentriq. Labs are reviewed and discussed with patient. Counts acceptable to proceed with treatment today.  #Tachycardia, evaluated by cardiology.  Patient has 2D echo scheduled at the end of July. Patient is asymptomatic.  #Severe hypokalemia, potassium level has normalized to 3.4.  Recommend patient to continue potassium 10 mEq daily. #Weight loss, likely due to decreased oral intake. Continue Megace.  #Chemotherapy-induced neuropathy, bilateral hands and feet.   Taxol to 135 mg/m due to neuropathy. Continue gabapentin and increase to, continue 4 times daily. # Hypomagnesia, advise patient to take Slow Mag 1 tab daily, repeat Mag in 1 week.  We spent sufficient time to discuss many aspect of care, questions were answered to patient's satisfaction. All questions were answered. The patient knows to call the clinic with any problems questions or concerns.   Earlie Server, MD, PhD 09/05/2019

## 2019-09-05 NOTE — Progress Notes (Signed)
Neuropathy is getting worse.

## 2019-09-06 LAB — AFP TUMOR MARKER: AFP, Serum, Tumor Marker: 11.4 ng/mL — ABNORMAL HIGH (ref 0.0–8.3)

## 2019-09-12 ENCOUNTER — Inpatient Hospital Stay: Payer: Medicare Other

## 2019-09-12 ENCOUNTER — Other Ambulatory Visit: Payer: Self-pay

## 2019-09-12 ENCOUNTER — Encounter: Payer: Self-pay | Admitting: Family Medicine

## 2019-09-12 ENCOUNTER — Ambulatory Visit (INDEPENDENT_AMBULATORY_CARE_PROVIDER_SITE_OTHER): Payer: Medicare Other | Admitting: Family Medicine

## 2019-09-12 ENCOUNTER — Ambulatory Visit: Payer: Self-pay | Admitting: *Deleted

## 2019-09-12 VITALS — BP 137/95 | HR 93 | Temp 96.8°F | Ht 65.0 in | Wt 133.2 lb

## 2019-09-12 DIAGNOSIS — I712 Thoracic aortic aneurysm, without rupture, unspecified: Secondary | ICD-10-CM

## 2019-09-12 DIAGNOSIS — C220 Liver cell carcinoma: Secondary | ICD-10-CM

## 2019-09-12 DIAGNOSIS — C7802 Secondary malignant neoplasm of left lung: Secondary | ICD-10-CM

## 2019-09-12 DIAGNOSIS — C7951 Secondary malignant neoplasm of bone: Secondary | ICD-10-CM | POA: Diagnosis not present

## 2019-09-12 DIAGNOSIS — R634 Abnormal weight loss: Secondary | ICD-10-CM | POA: Diagnosis not present

## 2019-09-12 DIAGNOSIS — J439 Emphysema, unspecified: Secondary | ICD-10-CM | POA: Diagnosis not present

## 2019-09-12 DIAGNOSIS — G620 Drug-induced polyneuropathy: Secondary | ICD-10-CM | POA: Diagnosis not present

## 2019-09-12 DIAGNOSIS — Z5112 Encounter for antineoplastic immunotherapy: Secondary | ICD-10-CM | POA: Diagnosis not present

## 2019-09-12 DIAGNOSIS — C3492 Malignant neoplasm of unspecified part of left bronchus or lung: Secondary | ICD-10-CM

## 2019-09-12 DIAGNOSIS — R0602 Shortness of breath: Secondary | ICD-10-CM

## 2019-09-12 DIAGNOSIS — I700 Atherosclerosis of aorta: Secondary | ICD-10-CM | POA: Diagnosis not present

## 2019-09-12 DIAGNOSIS — Z5111 Encounter for antineoplastic chemotherapy: Secondary | ICD-10-CM

## 2019-09-12 DIAGNOSIS — J948 Other specified pleural conditions: Secondary | ICD-10-CM | POA: Diagnosis not present

## 2019-09-12 DIAGNOSIS — R600 Localized edema: Secondary | ICD-10-CM | POA: Diagnosis not present

## 2019-09-12 DIAGNOSIS — E876 Hypokalemia: Secondary | ICD-10-CM | POA: Diagnosis not present

## 2019-09-12 DIAGNOSIS — I10 Essential (primary) hypertension: Secondary | ICD-10-CM

## 2019-09-12 DIAGNOSIS — R000 Tachycardia, unspecified: Secondary | ICD-10-CM | POA: Diagnosis not present

## 2019-09-12 DIAGNOSIS — B1920 Unspecified viral hepatitis C without hepatic coma: Secondary | ICD-10-CM | POA: Diagnosis not present

## 2019-09-12 DIAGNOSIS — T451X5A Adverse effect of antineoplastic and immunosuppressive drugs, initial encounter: Secondary | ICD-10-CM | POA: Diagnosis not present

## 2019-09-12 LAB — CBC WITH DIFFERENTIAL/PLATELET
Abs Immature Granulocytes: 0.19 10*3/uL — ABNORMAL HIGH (ref 0.00–0.07)
Basophils Absolute: 0 10*3/uL (ref 0.0–0.1)
Basophils Relative: 0 %
Eosinophils Absolute: 0.1 10*3/uL (ref 0.0–0.5)
Eosinophils Relative: 1 %
HCT: 37.5 % — ABNORMAL LOW (ref 39.0–52.0)
Hemoglobin: 12.5 g/dL — ABNORMAL LOW (ref 13.0–17.0)
Immature Granulocytes: 2 %
Lymphocytes Relative: 16 %
Lymphs Abs: 1.4 10*3/uL (ref 0.7–4.0)
MCH: 29.5 pg (ref 26.0–34.0)
MCHC: 33.3 g/dL (ref 30.0–36.0)
MCV: 88.4 fL (ref 80.0–100.0)
Monocytes Absolute: 0.7 10*3/uL (ref 0.1–1.0)
Monocytes Relative: 9 %
Neutro Abs: 6.2 10*3/uL (ref 1.7–7.7)
Neutrophils Relative %: 72 %
Platelets: 238 10*3/uL (ref 150–400)
RBC: 4.24 MIL/uL (ref 4.22–5.81)
RDW: 19.9 % — ABNORMAL HIGH (ref 11.5–15.5)
WBC: 8.6 10*3/uL (ref 4.0–10.5)
nRBC: 0.3 % — ABNORMAL HIGH (ref 0.0–0.2)

## 2019-09-12 LAB — COMPREHENSIVE METABOLIC PANEL
ALT: 54 U/L — ABNORMAL HIGH (ref 0–44)
AST: 49 U/L — ABNORMAL HIGH (ref 15–41)
Albumin: 3.1 g/dL — ABNORMAL LOW (ref 3.5–5.0)
Alkaline Phosphatase: 63 U/L (ref 38–126)
Anion gap: 5 (ref 5–15)
BUN: 13 mg/dL (ref 8–23)
CO2: 28 mmol/L (ref 22–32)
Calcium: 8.5 mg/dL — ABNORMAL LOW (ref 8.9–10.3)
Chloride: 105 mmol/L (ref 98–111)
Creatinine, Ser: 0.63 mg/dL (ref 0.61–1.24)
GFR calc Af Amer: >60 mL/min (ref >60)
GFR calc non Af Amer: >60 mL/min (ref >60)
Glucose, Bld: 87 mg/dL (ref 70–99)
Potassium: 3.7 mmol/L (ref 3.5–5.1)
Sodium: 138 mmol/L (ref 135–145)
Total Bilirubin: 0.5 mg/dL (ref 0.3–1.2)
Total Protein: 6 g/dL — ABNORMAL LOW (ref 6.5–8.1)

## 2019-09-12 LAB — MAGNESIUM: Magnesium: 1.5 mg/dL — ABNORMAL LOW (ref 1.7–2.4)

## 2019-09-12 MED ORDER — SODIUM CHLORIDE 0.9% FLUSH
10.0000 mL | INTRAVENOUS | Status: DC | PRN
  Administered 2019-09-12: 10 mL via INTRAVENOUS
  Filled 2019-09-12: qty 10

## 2019-09-12 MED ORDER — HYDROCHLOROTHIAZIDE 25 MG PO TABS: 25.0000 mg | ORAL_TABLET | Freq: Every day | ORAL | 12 refills | Status: DC

## 2019-09-12 MED ORDER — HEPARIN SOD (PORK) LOCK FLUSH 100 UNIT/ML IV SOLN
INTRAVENOUS | Status: AC
  Filled 2019-09-12: qty 5

## 2019-09-12 MED ORDER — MAGNESIUM SULFATE 2 GM/50ML IV SOLN
2.0000 g | Freq: Once | INTRAVENOUS | Status: AC
  Administered 2019-09-12: 2 g via INTRAVENOUS
  Filled 2019-09-12: qty 50

## 2019-09-12 MED ORDER — SODIUM CHLORIDE 0.9 % IV SOLN
Freq: Once | INTRAVENOUS | Status: AC
  Filled 2019-09-12: qty 250

## 2019-09-12 MED ORDER — HEPARIN SOD (PORK) LOCK FLUSH 100 UNIT/ML IV SOLN
500.0000 [IU] | Freq: Once | INTRAVENOUS | Status: AC
  Administered 2019-09-12: 500 [IU] via INTRAVENOUS
  Filled 2019-09-12: qty 5

## 2019-09-12 NOTE — Progress Notes (Signed)
Trena Platt Cummings,acting as a scribe for Wilhemena Durie, MD.,have documented all relevant documentation on the behalf of Wilhemena Durie, MD,as directed by  Wilhemena Durie, MD while in the presence of Wilhemena Durie, MD.  Established patient visit   Patient: Samuel Hood.   DOB: 1950-11-09   69 y.o. Male  MRN: [id number] Visit Date: 09/12/2019  Today's healthcare provider: Wilhemena Durie, MD   Chief Complaint  Patient presents with   Hypertension   Subjective    HPI   Patient presents today in office for elevated blood pressure, patient is currently not taking anything for hypertension was prescribed amlodipine previously by another provider but he was taken off the medication because his blood pressure was fluctuating.  He was getting chemotherapy this morning and was sent over because his blood pressure is elevated.  He does not wish to be here but is here just to get his blood pressure treated so he can get chemotherapy.      Medications: Outpatient Medications Prior to Visit  Medication Sig   dexamethasone (DECADRON) 4 MG tablet Take 2 tablets (8 mg total) by mouth daily. Start the day after carboplatin chemotherapy for 3 days.   fentaNYL (DURAGESIC) 25 MCG/HR Place 1 patch onto the skin every 3 (three) days.   gabapentin (NEURONTIN) 300 MG capsule Take 1 capsule (300 mg total) by mouth 4 (four) times daily.   lidocaine-prilocaine (EMLA) cream Apply to affected area once (Patient taking differently: Apply 1 application topically daily as needed (port acess). )   magnesium chloride (SLOW-MAG) 64 MG TBEC SR tablet Take 1 tablet (64 mg total) by mouth daily.   Multiple Vitamin (MULTIVITAMIN) capsule Take 1 capsule by mouth daily.   ondansetron (ZOFRAN) 8 MG tablet Take 1 tablet (8 mg total) by mouth 2 (two) times daily as needed for refractory nausea / vomiting. Start on day 3 after carboplatin chemo.   oxyCODONE (ROXICODONE) 5 MG immediate release  tablet Take 1 tablet (5 mg total) by mouth every 8 (eight) hours as needed for severe pain or breakthrough pain.   polyethylene glycol (MIRALAX / GLYCOLAX) 17 g packet Take 17 g by mouth daily.   potassium chloride (KLOR-CON) 10 MEQ tablet Take 1 tablet (10 mEq total) by mouth daily.   prochlorperazine (COMPAZINE) 10 MG tablet Take 1 tablet (10 mg total) by mouth every 6 (six) hours as needed (Nausea or vomiting).   senna (SENOKOT) 8.6 MG TABS tablet Take 1 tablet (8.6 mg total) by mouth daily.   loperamide (IMODIUM) 2 MG capsule Take 1 capsule (2 mg total) by mouth See admin instructions. With onset of loose stool, take '4mg'$  followed by '2mg'$  every 2 hours until 12 hours have passed without loose bowel movement. Maximum: 16 mg/day (Patient not taking: Reported on 08/15/2019)   megestrol (MEGACE) 40 MG tablet Take 1 tablet (40 mg total) by mouth 2 (two) times daily. (Patient not taking: Reported on 08/15/2019)   NARCAN 4 MG/0.1ML LIQD nasal spray kit 1 spray once.  (Patient not taking: Reported on 08/15/2019)   No facility-administered medications prior to visit.    Review of Systems     Objective    BP (!) 137/95 (BP Location: Right Arm, Patient Position: Sitting, Cuff Size: Normal)    Pulse 93    Temp (!) 96.8 F (36 C) (Temporal)    Ht '5\' 5"'$  (1.651 m)    Wt 133 lb 3.2 oz (60.4 kg)  BMI 22.17 kg/m  Wt Readings from Last 3 Encounters:  09/12/19 133 lb 3.2 oz (60.4 kg)  09/05/19 132 lb 1.6 oz (59.9 kg)  08/15/19 136 lb 9.6 oz (62 kg)      Physical Exam Vitals reviewed.  Constitutional:      General: He is not in acute distress.    Appearance: He is well-developed.  HENT:     Head: Normocephalic and atraumatic.     Right Ear: Hearing and tympanic membrane normal.     Left Ear: Hearing and tympanic membrane normal.     Nose: Nose normal.     Mouth/Throat:     Comments: Edentulous - compensated with dentures. Eyes:     General: Lids are normal. No scleral icterus.       Right  eye: No discharge.        Left eye: No discharge.     Conjunctiva/sclera: Conjunctivae normal.  Cardiovascular:     Rate and Rhythm: Regular rhythm. Tachycardia present.     Pulses: Normal pulses.     Heart sounds: Normal heart sounds.  Pulmonary:     Effort: Pulmonary effort is normal. No respiratory distress.  Abdominal:     General: Bowel sounds are normal.     Palpations: Abdomen is soft.  Musculoskeletal:        General: Normal range of motion.  Skin:    General: Skin is warm.     Findings: No lesion or rash.  Neurological:     Mental Status: He is alert and oriented to person, place, and time.     Sensory: No sensory deficit.     Comments: Normal foot exam without loss of sensation to test with nylon string bilaterally.  Psychiatric:        Speech: Speech normal.        Behavior: Behavior normal.        Thought Content: Thought content normal.       Results for orders placed or performed in visit on 09/12/19  Comprehensive metabolic panel  Result Value Ref Range   Sodium 138 135 - 145 mmol/L   Potassium 3.7 3.5 - 5.1 mmol/L   Chloride 105 98 - 111 mmol/L   CO2 28 22 - 32 mmol/L   Glucose, Bld 87 70 - 99 mg/dL   BUN 13 8 - 23 mg/dL   Creatinine, Ser 0.63 0.61 - 1.24 mg/dL   Calcium 8.5 (L) 8.9 - 10.3 mg/dL   Total Protein 6.0 (L) 6.5 - 8.1 g/dL   Albumin 3.1 (L) 3.5 - 5.0 g/dL   AST 49 (H) 15 - 41 U/L   ALT 54 (H) 0 - 44 U/L   Alkaline Phosphatase 63 38 - 126 U/L   Total Bilirubin 0.5 0.3 - 1.2 mg/dL   GFR calc non Af Amer >60 >60 mL/min   GFR calc Af Amer >60 >60 mL/min   Anion gap 5 5 - 15  CBC with Differential  Result Value Ref Range   WBC 8.6 4.0 - 10.5 K/uL   RBC 4.24 4.22 - 5.81 MIL/uL   Hemoglobin 12.5 (L) 13.0 - 17.0 g/dL   HCT 37.5 (L) 39 - 52 %   MCV 88.4 80.0 - 100.0 fL   MCH 29.5 26.0 - 34.0 pg   MCHC 33.3 30.0 - 36.0 g/dL   RDW 19.9 (H) 11.5 - 15.5 %   Platelets 238 150 - 400 K/uL   nRBC 0.3 (H) 0.0 - 0.2 %  Neutrophils Relative % 72 %     Neutro Abs 6.2 1.7 - 7.7 K/uL   Lymphocytes Relative 16 %   Lymphs Abs 1.4 0.7 - 4.0 K/uL   Monocytes Relative 9 %   Monocytes Absolute 0.7 0 - 1 K/uL   Eosinophils Relative 1 %   Eosinophils Absolute 0.1 0 - 0 K/uL   Basophils Relative 0 %   Basophils Absolute 0.0 0 - 0 K/uL   Immature Granulocytes 2 %   Abs Immature Granulocytes 0.19 (H) 0.00 - 0.07 K/uL  Magnesium  Result Value Ref Range   Magnesium 1.5 (L) 1.7 - 2.4 mg/dL    Assessment & Plan     1. Essential hypertension Start HCTZ.  Patient  states he was intolerant of amlodipine - hydrochlorothiazide (HYDRODIURIL) 25 MG tablet; Take 1 tablet (25 mg total) by mouth daily.  Dispense: 30 tablet; Refill: 12  2. Hepatocellular carcinoma metastatic to left lung [facility]) Undergoing chemotherapy.  3. Thoracic aortic aneurysm without rupture ([place])- NOTED ON CT 06/30/2018 Chronic uncontrolled blood pressure is much as possible.    Return if symptoms worsen or fail to improve.         Khambrel Amsden Cranford Mon, MD  [facility] [phone] (phone) [phone] (fax)  [place]

## 2019-09-12 NOTE — Progress Notes (Signed)
1243: Pt tolerated infusion well. Pt continues to deny any s/s or concerns. Pt educated to call 911 or report to ER in the event of an emergerency or call clinic with any questions or concerns. Pt verbalizes understanding. Pt educated to increase Home Magnesium supplement to twice a day per Benjamine Mola RN per Dr. Tasia Catchings. Pt verbalizes understanding and will report to PCP for scheduled appt. Pt stable at time of discharge.

## 2019-09-12 NOTE — Progress Notes (Signed)
**  See flow sheets for B/P readings** Dr. Tasia Catchings aware of B/P readings and Magnesium level of 1.5. pt denies any s/s or concerns at this time including but not limited to chest pain, blurred vision, headaches, etc. Per Benjamine Mola RN per Dr. Tasia Catchings, pt to receive 2 g IV Magnesium today (okay to proceed with Magnesium with elevated B/P Per Benjamine Mola RN per Dr. Tasia Catchings) and pt to contact PCP regarding B/P readings. Pt called PCP office and this nurse and pt spoke with nurse. Appointment made for 1320, pt aware and verbalizes understanding.

## 2019-09-12 NOTE — Telephone Encounter (Signed)
[place] Calling to report patient has elevated BP today- patient was taken off BP medication due to low readings- but it seems to be creeping back up during the last few weeks. No symptoms at this time- patient is having infusion now- appointment made to follow to address increasing BP.   Reason for Disposition . Systolic BP  >= 709 OR Diastolic >= 643  Answer Assessment - Initial Assessment Questions 1. BLOOD PRESSURE: "What is the blood pressure?" "Did you take at least two measurements 5 minutes apart?"     150/107, 152/104 2. ONSET: "When did you take your blood pressure?"     10:47 3. HOW: "How did you obtain the blood pressure?" (e.g., visiting nurse, automatic home BP monitor)     Automatic cuff 4. HISTORY: "Do you have a history of high blood pressure?"     yes 5. MEDICATIONS: "Are you taking any medications for blood pressure?" "Have you missed any doses recently?"     Off medication- creeping up since during the last month 6. OTHER SYMPTOMS: "Do you have any symptoms?" (e.g., headache, chest pain, blurred vision, difficulty breathing, weakness)     no 7. PREGNANCY: "Is there any chance you are pregnant?" "When was your last menstrual period?"     n/a  Protocols used: BLOOD PRESSURE - HIGH-A-AH

## 2019-09-12 NOTE — Addendum Note (Signed)
Addended by: Meribeth Mattes H on: 09/12/2019 01:06 PM   Modules accepted: Orders

## 2019-09-14 ENCOUNTER — Other Ambulatory Visit: Payer: Self-pay

## 2019-09-14 ENCOUNTER — Ambulatory Visit (INDEPENDENT_AMBULATORY_CARE_PROVIDER_SITE_OTHER): Payer: Medicare Other

## 2019-09-14 DIAGNOSIS — R0602 Shortness of breath: Secondary | ICD-10-CM | POA: Diagnosis not present

## 2019-09-14 LAB — ECHOCARDIOGRAM COMPLETE
AR max vel: 2.87 cm2
AV Area VTI: 4.14 cm2
AV Area mean vel: 3.04 cm2
AV Mean grad: 2 mmHg
AV Peak grad: 3.8 mmHg
Ao pk vel: 0.98 m/s
Area-P 1/2: 9.73 cm2
Calc EF: 47.4 %
S' Lateral: 2 cm
Single Plane A2C EF: 44.9 %
Single Plane A4C EF: 45.1 %

## 2019-09-18 ENCOUNTER — Encounter: Payer: Self-pay | Admitting: Cardiology

## 2019-09-18 ENCOUNTER — Ambulatory Visit (INDEPENDENT_AMBULATORY_CARE_PROVIDER_SITE_OTHER): Payer: Medicare Other | Admitting: Cardiology

## 2019-09-18 ENCOUNTER — Other Ambulatory Visit: Payer: Self-pay

## 2019-09-18 VITALS — BP 104/80 | HR 117 | Ht 65.0 in | Wt 130.0 lb

## 2019-09-18 DIAGNOSIS — C3490 Malignant neoplasm of unspecified part of unspecified bronchus or lung: Secondary | ICD-10-CM | POA: Diagnosis not present

## 2019-09-18 DIAGNOSIS — I10 Essential (primary) hypertension: Secondary | ICD-10-CM | POA: Diagnosis not present

## 2019-09-18 DIAGNOSIS — R0602 Shortness of breath: Secondary | ICD-10-CM

## 2019-09-18 NOTE — Patient Instructions (Signed)
Medication Instructions:  Your physician recommends that you continue on your current medications as directed. Please refer to the Current Medication list given to you today.  *If you need a refill on your cardiac medications before your next appointment, please call your pharmacy*   Lab Work: None Ordered If you have labs (blood work) drawn today and your tests are completely normal, you will receive your results only by: Marland Kitchen MyChart Message (if you have MyChart) OR . A paper copy in the mail If you have any lab test that is abnormal or we need to change your treatment, we will call you to review the results.   Testing/Procedures: None Ordered   Follow-Up: At [facility], you and your health needs are our priority.  As part of our continuing mission to provide you with exceptional heart care, we have created designated Provider Care Teams.  These Care Teams include your primary Cardiologist (physician) and Advanced Practice Providers (APPs -  Physician Assistants and Nurse Practitioners) who all work together to provide you with the care you need, when you need it.  We recommend signing up for the patient portal called "MyChart".  Sign up information is provided on this After Visit Summary.  MyChart is used to connect with patients for Virtual Visits (Telemedicine).  Patients are able to view lab/test results, encounter notes, upcoming appointments, etc.  Non-urgent messages can be sent to your provider as well.   To learn more about what you can do with MyChart, go to [email or web address].    Your next appointment:   1 year(s)  The format for your next appointment:   In Person  Provider:   Kate Sable, MD   Other Instructions

## 2019-09-18 NOTE — Progress Notes (Signed)
Cardiology Office Note:    Date:  09/18/2019   ID:  Samuel Aho., DOB 1950/08/29, MRN [id number]  PCP:  Jerrol Banana., MD  [facility] Cardiologist:  Kate Sable, MD  [facility] Electrophysiologist:  None   Referring MD: Jerrol Banana.,*   Chief Complaint  Patient presents with  . office visit    F/U after echo; Difficulty reconciling meds with patient-he is unsure of what he is taking. He brought a list from recent visit at [facility] but is unable to verify.     History of Present Illness:    Samuel Pickelsimer. is a 69 y.o. male with a hx of hypertension, former smoker x50 years, stage IV lung cancer who presents for follow-up.  He was last seen due to shortness of breath.  Echocardiogram was performed to evaluate systolic and diastolic function.  He was previously on Norvasc, this was stopped after last visit.  Currently undergoing chemotherapy for lung cancer.  He states his blood pressure has been elevated of late given chemo.  Started on HCTZ which has helped his blood pressure.  He is otherwise doing okay, sometimes feels tired from his chemotherapy.   Past Medical History:  Diagnosis Date  . Hepatocellular carcinoma metastatic to left lung ([place]) 07/24/2018  . History of angiography    left lower extremity  . Hypertension   . Small bowel obstruction [facility])     Past Surgical History:  Procedure Laterality Date  . COLON SURGERY    . COLONOSCOPY W/ POLYPECTOMY    . COLONOSCOPY WITH PROPOFOL N/A 01/31/2018   Procedure: COLONOSCOPY WITH PROPOFOL;  Surgeon: Toledo, Benay Pike, MD;  Location: [facility] ENDOSCOPY;  Service: Gastroenterology;  Laterality: N/A;  . debridement fasciotomy leg, left Left 03/19/2016  . LAPAROTOMY N/A 09/27/2014   Procedure: EXPLORATORY LAPAROTOMY;  Surgeon: Dia Crawford III, MD;  Location: [facility] ORS;  Service: General;  Laterality: N/A;  . PORTA CATH INSERTION N/A 04/10/2019   Procedure: PORTA CATH INSERTION;  Surgeon: Katha Cabal, MD;  Location: [facility] CV LAB;  Service: Cardiovascular;  Laterality: N/A;    Current Medications: Current Meds  Medication Sig  . dexamethasone (DECADRON) 4 MG tablet Take 2 tablets (8 mg total) by mouth daily. Start the day after carboplatin chemotherapy for 3 days.  . fentaNYL (DURAGESIC) 25 MCG/HR Place 1 patch onto the skin every 3 (three) days.  Marland Kitchen gabapentin (NEURONTIN) 300 MG capsule Take 1 capsule (300 mg total) by mouth 4 (four) times daily.  . hydrochlorothiazide (HYDRODIURIL) 25 MG tablet Take 1 tablet (25 mg total) by mouth daily.  Marland Kitchen lidocaine-prilocaine (EMLA) cream Apply 1 application topically as needed.  . magnesium chloride (SLOW-MAG) 64 MG TBEC SR tablet Take 1 tablet (64 mg total) by mouth daily.  . Multiple Vitamin (MULTIVITAMIN) capsule Take 1 capsule by mouth daily.  Marland Kitchen NARCAN 4 MG/0.1ML LIQD nasal spray kit 1 spray once.   . ondansetron (ZOFRAN) 8 MG tablet Take 1 tablet (8 mg total) by mouth 2 (two) times daily as needed for refractory nausea / vomiting. Start on day 3 after carboplatin chemo.  Marland Kitchen oxyCODONE (ROXICODONE) 5 MG immediate release tablet Take 1 tablet (5 mg total) by mouth every 8 (eight) hours as needed for severe pain or breakthrough pain.  . polyethylene glycol (MIRALAX / GLYCOLAX) 17 g packet Take 17 g by mouth daily.  . potassium chloride (KLOR-CON) 10 MEQ tablet Take 1 tablet (10 mEq total) by mouth  daily.  . prochlorperazine (COMPAZINE) 10 MG tablet Take 1 tablet (10 mg total) by mouth every 6 (six) hours as needed (Nausea or vomiting).  Marland Kitchen senna (SENOKOT) 8.6 MG TABS tablet Take 1 tablet (8.6 mg total) by mouth daily.     Allergies:   5-alpha reductase inhibitors   Social History   Socioeconomic History  . Marital status: Single    Spouse name: Not on file  . Number of children: Not on file  . Years of education: Not on file  . Highest education level: Not on file  Occupational History  . Occupation: retired  Tobacco Use  .  Smoking status: Current Every Day Smoker    Packs/day: 0.25    Years: 50.00    Pack years: 12.50  . Smokeless tobacco: Never Used  . Tobacco comment: 3 cigarettes per day  Vaping Use  . Vaping Use: Never used  Substance and Sexual Activity  . Alcohol use: Not Currently    Alcohol/week: 35.0 standard drinks    Types: 30 Cans of beer, 5 Shots of liquor per week  . Drug use: No  . Sexual activity: Not on file  Other Topics Concern  . Not on file  Social History Narrative  . Not on file   Social Determinants of Health   Financial Resource Strain:   . Difficulty of Paying Living Expenses:   Food Insecurity:   . Worried About Charity fundraiser in the Last Year:   . Arboriculturist in the Last Year:   Transportation Needs:   . Film/video editor (Medical):   Marland Kitchen Lack of Transportation (Non-Medical):   Physical Activity:   . Days of Exercise per Week:   . Minutes of Exercise per Session:   Stress:   . Feeling of Stress :   Social Connections:   . Frequency of Communication with Friends and Family:   . Frequency of Social Gatherings with Friends and Family:   . Attends Religious Services:   . Active Member of Clubs or Organizations:   . Attends Archivist Meetings:   Marland Kitchen Marital Status:      Family History: The patient's family history includes Cancer in his father and mother.  ROS:   Please see the history of present illness.     All other systems reviewed and are negative.  EKGs/Labs/Other Studies Reviewed:    The following studies were reviewed today:   EKG:  EKG is  ordered today.  The ekg ordered today demonstrates sinus tachycardia, otherwise normal.  Recent Labs: 08/08/2019: TSH 1.770 09/12/2019: ALT 54; BUN 13; Creatinine, Ser 0.63; Hemoglobin 12.5; Magnesium 1.5; Platelets 238; Potassium 3.7; Sodium 138  Recent Lipid Panel    Component Value Date/Time   CHOL 108 06/23/2018 1100   TRIG 56 06/23/2018 1100   HDL 43 06/23/2018 1100   CHOLHDL 2.5  06/23/2018 1100   LDLCALC 54 06/23/2018 1100    Physical Exam:    VS:  BP 104/80 (BP Location: Left Arm, Patient Position: Sitting, Cuff Size: Normal)   Pulse (!) 117   Ht '5\' 5"'$  (1.651 m)   Wt 130 lb (59 kg)   SpO2 99%   BMI 21.63 kg/m     Wt Readings from Last 3 Encounters:  09/18/19 130 lb (59 kg)  09/12/19 133 lb 3.2 oz (60.4 kg)  09/05/19 132 lb 1.6 oz (59.9 kg)     GEN:  Well nourished, well developed in no acute distress HEENT: Normal  NECK: No JVD; No carotid bruits LYMPHATICS: No lymphadenopathy CARDIAC: RRR, no murmurs, rubs, gallops RESPIRATORY: Decreased breath sounds ABDOMEN: Soft, non-tender, non-distended MUSCULOSKELETAL:  No edema; No deformity  SKIN: Warm and dry NEUROLOGIC:  Alert and oriented x 3 PSYCHIATRIC:  Normal affect   ASSESSMENT:    1. SOB (shortness of breath)   2. Essential hypertension   3. Malignant neoplasm of lung, unspecified laterality, unspecified part of lung ([place])    PLAN:    In order of problems listed above:  1. Patient with shortness of breath with exertion.  Echocardiogram shows normal systolic function, EF 03%.  Likely has emphysema due to long smoking history.  Patient made aware of echo reports, reassured. 2. History of hypertension, blood pressure controlled.  cont HCTZ. 3. Lung cancer, management as per oncology.   Follow-up in 12 months.  Total encounter time 35 minutes  Greater than 50% was spent in counseling and coordination of care with the patient  Medication Adjustments/Labs and Tests Ordered: Current medicines are reviewed at length with the patient today.  Concerns regarding medicines are outlined above.  Orders Placed This Encounter  Procedures  . EKG 12-Lead   No orders of the defined types were placed in this encounter.   Patient Instructions  Medication Instructions:  Your physician recommends that you continue on your current medications as directed. Please refer to the Current Medication list  given to you today.  *If you need a refill on your cardiac medications before your next appointment, please call your pharmacy*   Lab Work: None Ordered If you have labs (blood work) drawn today and your tests are completely normal, you will receive your results only by: Marland Kitchen MyChart Message (if you have MyChart) OR . A paper copy in the mail If you have any lab test that is abnormal or we need to change your treatment, we will call you to review the results.   Testing/Procedures: None Ordered   Follow-Up: At [facility], you and your health needs are our priority.  As part of our continuing mission to provide you with exceptional heart care, we have created designated Provider Care Teams.  These Care Teams include your primary Cardiologist (physician) and Advanced Practice Providers (APPs -  Physician Assistants and Nurse Practitioners) who all work together to provide you with the care you need, when you need it.  We recommend signing up for the patient portal called "MyChart".  Sign up information is provided on this After Visit Summary.  MyChart is used to connect with patients for Virtual Visits (Telemedicine).  Patients are able to view lab/test results, encounter notes, upcoming appointments, etc.  Non-urgent messages can be sent to your provider as well.   To learn more about what you can do with MyChart, go to [email or web address].    Your next appointment:   1 year(s)  The format for your next appointment:   In Person  Provider:   Kate Sable, MD   Other Instructions      Signed, Kate Sable, MD  09/18/2019 10:29 AM    [place]

## 2019-09-25 ENCOUNTER — Encounter: Payer: Self-pay | Admitting: Oncology

## 2019-09-25 NOTE — Progress Notes (Signed)
Pt prescreened for follow up visit. When reviewing medications he states that "everything is the same." However, he did mention that he was started on new BP med hydrochlorothiazide. No other concerns voiced.

## 2019-09-26 ENCOUNTER — Inpatient Hospital Stay: Payer: Medicare Other

## 2019-09-26 ENCOUNTER — Inpatient Hospital Stay (HOSPITAL_BASED_OUTPATIENT_CLINIC_OR_DEPARTMENT_OTHER): Payer: Medicare Other | Admitting: Oncology

## 2019-09-26 ENCOUNTER — Other Ambulatory Visit: Payer: Self-pay

## 2019-09-26 ENCOUNTER — Inpatient Hospital Stay (HOSPITAL_BASED_OUTPATIENT_CLINIC_OR_DEPARTMENT_OTHER): Payer: Medicare Other | Admitting: Hospice and Palliative Medicine

## 2019-09-26 ENCOUNTER — Encounter: Payer: Self-pay | Admitting: Oncology

## 2019-09-26 ENCOUNTER — Inpatient Hospital Stay: Payer: Medicare Other | Attending: Oncology

## 2019-09-26 VITALS — BP 100/80 | HR 114 | Temp 96.8°F | Resp 16 | Wt 128.7 lb

## 2019-09-26 DIAGNOSIS — I712 Thoracic aortic aneurysm, without rupture: Secondary | ICD-10-CM | POA: Diagnosis not present

## 2019-09-26 DIAGNOSIS — I10 Essential (primary) hypertension: Secondary | ICD-10-CM | POA: Insufficient documentation

## 2019-09-26 DIAGNOSIS — R634 Abnormal weight loss: Secondary | ICD-10-CM

## 2019-09-26 DIAGNOSIS — C3412 Malignant neoplasm of upper lobe, left bronchus or lung: Secondary | ICD-10-CM | POA: Diagnosis not present

## 2019-09-26 DIAGNOSIS — R5383 Other fatigue: Secondary | ICD-10-CM

## 2019-09-26 DIAGNOSIS — I700 Atherosclerosis of aorta: Secondary | ICD-10-CM | POA: Insufficient documentation

## 2019-09-26 DIAGNOSIS — M25512 Pain in left shoulder: Secondary | ICD-10-CM | POA: Diagnosis not present

## 2019-09-26 DIAGNOSIS — F17210 Nicotine dependence, cigarettes, uncomplicated: Secondary | ICD-10-CM | POA: Insufficient documentation

## 2019-09-26 DIAGNOSIS — G620 Drug-induced polyneuropathy: Secondary | ICD-10-CM | POA: Diagnosis not present

## 2019-09-26 DIAGNOSIS — E876 Hypokalemia: Secondary | ICD-10-CM | POA: Insufficient documentation

## 2019-09-26 DIAGNOSIS — C7951 Secondary malignant neoplasm of bone: Secondary | ICD-10-CM

## 2019-09-26 DIAGNOSIS — K8020 Calculus of gallbladder without cholecystitis without obstruction: Secondary | ICD-10-CM | POA: Insufficient documentation

## 2019-09-26 DIAGNOSIS — Z7952 Long term (current) use of systemic steroids: Secondary | ICD-10-CM | POA: Insufficient documentation

## 2019-09-26 DIAGNOSIS — Z809 Family history of malignant neoplasm, unspecified: Secondary | ICD-10-CM | POA: Diagnosis not present

## 2019-09-26 DIAGNOSIS — C220 Liver cell carcinoma: Secondary | ICD-10-CM

## 2019-09-26 DIAGNOSIS — C3492 Malignant neoplasm of unspecified part of left bronchus or lung: Secondary | ICD-10-CM

## 2019-09-26 DIAGNOSIS — T451X5A Adverse effect of antineoplastic and immunosuppressive drugs, initial encounter: Secondary | ICD-10-CM | POA: Diagnosis not present

## 2019-09-26 DIAGNOSIS — Z79899 Other long term (current) drug therapy: Secondary | ICD-10-CM | POA: Insufficient documentation

## 2019-09-26 DIAGNOSIS — J449 Chronic obstructive pulmonary disease, unspecified: Secondary | ICD-10-CM | POA: Insufficient documentation

## 2019-09-26 DIAGNOSIS — C787 Secondary malignant neoplasm of liver and intrahepatic bile duct: Secondary | ICD-10-CM | POA: Insufficient documentation

## 2019-09-26 DIAGNOSIS — G893 Neoplasm related pain (acute) (chronic): Secondary | ICD-10-CM

## 2019-09-26 DIAGNOSIS — Z5112 Encounter for antineoplastic immunotherapy: Secondary | ICD-10-CM | POA: Insufficient documentation

## 2019-09-26 DIAGNOSIS — G629 Polyneuropathy, unspecified: Secondary | ICD-10-CM | POA: Diagnosis not present

## 2019-09-26 DIAGNOSIS — R000 Tachycardia, unspecified: Secondary | ICD-10-CM | POA: Diagnosis not present

## 2019-09-26 DIAGNOSIS — Z5111 Encounter for antineoplastic chemotherapy: Secondary | ICD-10-CM

## 2019-09-26 DIAGNOSIS — J948 Other specified pleural conditions: Secondary | ICD-10-CM | POA: Diagnosis not present

## 2019-09-26 DIAGNOSIS — C7802 Secondary malignant neoplasm of left lung: Secondary | ICD-10-CM

## 2019-09-26 DIAGNOSIS — Z125 Encounter for screening for malignant neoplasm of prostate: Secondary | ICD-10-CM

## 2019-09-26 DIAGNOSIS — B1920 Unspecified viral hepatitis C without hepatic coma: Secondary | ICD-10-CM | POA: Insufficient documentation

## 2019-09-26 DIAGNOSIS — Z515 Encounter for palliative care: Secondary | ICD-10-CM

## 2019-09-26 LAB — CBC WITH DIFFERENTIAL/PLATELET
Abs Immature Granulocytes: 0.02 10*3/uL (ref 0.00–0.07)
Basophils Absolute: 0 10*3/uL (ref 0.0–0.1)
Basophils Relative: 0 %
Eosinophils Absolute: 0.2 10*3/uL (ref 0.0–0.5)
Eosinophils Relative: 4 %
HCT: 40.8 % (ref 39.0–52.0)
Hemoglobin: 13.8 g/dL (ref 13.0–17.0)
Immature Granulocytes: 0 %
Lymphocytes Relative: 24 %
Lymphs Abs: 1.2 10*3/uL (ref 0.7–4.0)
MCH: 29.7 pg (ref 26.0–34.0)
MCHC: 33.8 g/dL (ref 30.0–36.0)
MCV: 87.7 fL (ref 80.0–100.0)
Monocytes Absolute: 0.5 10*3/uL (ref 0.1–1.0)
Monocytes Relative: 9 %
Neutro Abs: 3.2 10*3/uL (ref 1.7–7.7)
Neutrophils Relative %: 63 %
Platelets: 257 10*3/uL (ref 150–400)
RBC: 4.65 MIL/uL (ref 4.22–5.81)
RDW: 18.9 % — ABNORMAL HIGH (ref 11.5–15.5)
WBC: 5.1 10*3/uL (ref 4.0–10.5)
nRBC: 0 % (ref 0.0–0.2)

## 2019-09-26 LAB — COMPREHENSIVE METABOLIC PANEL
ALT: 50 U/L — ABNORMAL HIGH (ref 0–44)
AST: 58 U/L — ABNORMAL HIGH (ref 15–41)
Albumin: 3.4 g/dL — ABNORMAL LOW (ref 3.5–5.0)
Alkaline Phosphatase: 78 U/L (ref 38–126)
Anion gap: 12 (ref 5–15)
BUN: 17 mg/dL (ref 8–23)
CO2: 27 mmol/L (ref 22–32)
Calcium: 9.3 mg/dL (ref 8.9–10.3)
Chloride: 100 mmol/L (ref 98–111)
Creatinine, Ser: 0.77 mg/dL (ref 0.61–1.24)
GFR calc Af Amer: >60 mL/min (ref >60)
GFR calc non Af Amer: >60 mL/min (ref >60)
Glucose, Bld: 140 mg/dL — ABNORMAL HIGH (ref 70–99)
Potassium: 3.3 mmol/L — ABNORMAL LOW (ref 3.5–5.1)
Sodium: 139 mmol/L (ref 135–145)
Total Bilirubin: 0.4 mg/dL (ref 0.3–1.2)
Total Protein: 7 g/dL (ref 6.5–8.1)

## 2019-09-26 LAB — PROTEIN, URINE, RANDOM: Total Protein, Urine: <6 mg/dL

## 2019-09-26 LAB — TSH: TSH: 2.902 u[IU]/mL (ref 0.350–4.500)

## 2019-09-26 LAB — CORTISOL: Cortisol, Plasma: 19.9 ug/dL

## 2019-09-26 LAB — MAGNESIUM: Magnesium: 1.7 mg/dL (ref 1.7–2.4)

## 2019-09-26 MED ORDER — SODIUM CHLORIDE 0.9 % IV SOLN
1200.0000 mg | Freq: Once | INTRAVENOUS | Status: AC
  Administered 2019-09-26: 1200 mg via INTRAVENOUS
  Filled 2019-09-26: qty 20

## 2019-09-26 MED ORDER — ZOLEDRONIC ACID 4 MG/100ML IV SOLN
4.0000 mg | Freq: Once | INTRAVENOUS | Status: AC
  Administered 2019-09-26: 4 mg via INTRAVENOUS
  Filled 2019-09-26: qty 100

## 2019-09-26 MED ORDER — SODIUM CHLORIDE 0.9 % IV SOLN
15.0000 mg/kg | Freq: Once | INTRAVENOUS | Status: AC
  Administered 2019-09-26: 900 mg via INTRAVENOUS
  Filled 2019-09-26: qty 32

## 2019-09-26 MED ORDER — HEPARIN SOD (PORK) LOCK FLUSH 100 UNIT/ML IV SOLN
500.0000 [IU] | Freq: Once | INTRAVENOUS | Status: AC
  Administered 2019-09-26: 500 [IU] via INTRAVENOUS
  Filled 2019-09-26: qty 5

## 2019-09-26 MED ORDER — HEPARIN SOD (PORK) LOCK FLUSH 100 UNIT/ML IV SOLN
INTRAVENOUS | Status: AC
  Filled 2019-09-26: qty 5

## 2019-09-26 MED ORDER — SODIUM CHLORIDE 0.9 % IV SOLN
Freq: Once | INTRAVENOUS | Status: AC
  Filled 2019-09-26: qty 250

## 2019-09-26 MED ORDER — SODIUM CHLORIDE 0.9 % IV SOLN: 15.0000 mg/kg | Freq: Once | INTRAVENOUS | Status: DC

## 2019-09-26 MED ORDER — SODIUM CHLORIDE 0.9% FLUSH
10.0000 mL | Freq: Once | INTRAVENOUS | Status: DC
  Filled 2019-09-26: qty 10

## 2019-09-26 NOTE — Progress Notes (Signed)
Patient is feeling weak

## 2019-09-26 NOTE — Progress Notes (Signed)
HR 114 ok to proceed per md

## 2019-09-26 NOTE — Progress Notes (Signed)
Hematology/Oncology follow up note [facility] [phone] [phone]  Reassurance Patient Care Team: Gilbert, Richard L Jr., MD as PCP - General (Family Medicine) Agbor-Etang, Brian, MD as PCP - Cardiology (Cardiology) Rhode, Hayley, RN as Registered Nurse Stanton, Kristi D, RN as Registered Nurse  REFERRING PROVIDER: Gilbert, Richard L Jr.,*  CHIEF COMPLAINTS/REASON FOR VISIT:  metastatic cancer management.  HISTORY OF PRESENTING ILLNESS:   Samuel D Metheney Jr. is a  69 y.o.  male with PMH listed below was seen in consultation at the request of  Gilbert, Richard L Jr.,*  for evaluation of lung nodule in the liver lesion. Patient has a past medical history of hypertension, alcohol abuse, smoking emphysema.  He has had serial CTs done in the past.  CT images were independently reviewed by me. 07/26/2017 CT chest with contrast showed extensive pleural-parenchymal scarring within the left upper lobe with several areas of soft tissue nodularity measuring up to 1.6 cm.  In this patient who is at increased risk of lung cancer further investigation with PET scan is recommended. Small chronic appearance loculated hydropneumothorax overlies the left apex. Slowly enlarging lesion within the central portion of the right lobe of liver identified. Per note, patient supposed to have additional work-up done in December repeat CT scan which he did not  Patient was recently seen by primary care provider and has subsequent image done for follow-up. CT on 06/30/2018 showed multiple significantly enlarged paraspinal mass are noted concerning.  The largest measures 3.8 x 0.5 cm and there appears to be a lytic destruction of the adjacent T11 vertebral body.  Also noted is new solid density measuring 17 x 12 mm in the pleural parenchymal density in the left upper lobe noted on prior exam.  Concerning for malignancy.  4.4 ascending thoracic aortic aneurysm.  Recommend annual  imaging.  Patient had PET scan done on 07/06/2018 Which showed there are 2 FDG avid lesion within the left upper lobe worrisome for primary lung neoplasm.  There is evidence of chest wall involvement.  Metastasis to the posterior mediastinum is identified with extension into T11 vertebra and possible involvement of the left T12 neural foramina.  Patient reports left shoulder pain.  Smoking half a pack a day not motivated with further questioning quitting Denies weight loss, hemoptysis, cough.  Chronic shortness of breath with exertion. He drinks alcohol daily. Lives with his girlfriend.  He has 5 adult kids  #Hepatitis C  # 6/1?2020  CT-guided biopsy of left-sided posterior mediastinal soft tissue adjacent to T10/11. Patient's case was discussed on tumor board. Consensus was reached that HCC versus primary lung cancer with hepatoid adenocarcinoma features. Consensus was to proceed with liver biopsy first as liver mass was not FDG avid on PET scan.  Patient underwent liver biopsy and the present to discuss pathology reports.  ## 08/08/2018 Liver mass biopsy showed fragments of necrotic and calcified tissue with adjacent fibrous capsule Scant viable nonneoplastic liver tissue with steatosis, inflammation and non specific fibrosis.  # 08/28/2018 CT guided left upper lobe lung mass biopsy showed poorly differentiated adenocarcinoma of lung origin.  6/19-08/16/2018  Finished palliative radiation to paraspinal soft tissue mass. 09/07/2018 patient was started on lenvatinib 12 mg daily. 10/13/2018 SBRT to lung cancer lesion.  #Patient is on Zometa monthly #He was advised to take calcium supplement.  He reports he quit taking calcium as he broke out rash on her scalp, neck and chest after taking calcium. # 03/30/19 liver lesion biopsy showed poorly   differentiated adenocarcinoma, compatible with lung primary.  INTERVAL HISTORY Samuel Sek. is a 69 y.o. male who has above history reviewed by me today  presents for evaluation prior to chemotherapy for lung cancer and HCC. Patient reports that he feels tired. He takes gabapentin for neuropathy of his hands and bilateral lower extremities. He cannot tell me whether his symptoms are better or not.  He does not need to take oxycodone as frequent as previously. Patient has had cardiology work-up recently for tachycardia.  Echo was done. Appetite is fair.  He lost 2 pounds since last visit.   Review of Systems  Constitutional: Positive for fatigue and unexpected weight change. Negative for chills and fever.  HENT:   Negative for hearing loss and voice change.   Eyes: Negative for eye problems and icterus.  Respiratory: Negative for chest tightness, cough and shortness of breath.   Cardiovascular: Negative for chest pain and leg swelling.  Gastrointestinal: Negative for abdominal distention, abdominal pain and constipation.  Endocrine: Negative for hot flashes.  Genitourinary: Negative for difficulty urinating, dysuria and frequency.   Musculoskeletal: Negative for arthralgias.  Skin: Negative for itching and rash.  Neurological: Positive for numbness. Negative for light-headedness.  Hematological: Negative for adenopathy. Does not bruise/bleed easily.  Psychiatric/Behavioral: Negative for confusion.    MEDICAL HISTORY:  Past Medical History:  Diagnosis Date  . Hepatocellular carcinoma metastatic to left lung ([place]) 07/24/2018  . History of angiography    left lower extremity  . Hypertension   . Small bowel obstruction ([place])     SURGICAL HISTORY: Past Surgical History:  Procedure Laterality Date  . COLON SURGERY    . COLONOSCOPY W/ POLYPECTOMY    . COLONOSCOPY WITH PROPOFOL N/A 01/31/2018   Procedure: COLONOSCOPY WITH PROPOFOL;  Surgeon: Toledo, Benay Pike, MD;  Location: [facility] ENDOSCOPY;  Service: Gastroenterology;  Laterality: N/A;  . debridement fasciotomy leg, left Left 03/19/2016  . LAPAROTOMY N/A 09/27/2014   Procedure:  EXPLORATORY LAPAROTOMY;  Surgeon: Dia Crawford III, MD;  Location: [facility] ORS;  Service: General;  Laterality: N/A;  . PORTA CATH INSERTION N/A 04/10/2019   Procedure: PORTA CATH INSERTION;  Surgeon: Katha Cabal, MD;  Location: [facility];  Service: Cardiovascular;  Laterality: N/A;    SOCIAL HISTORY: Social History   Socioeconomic History  . Marital status: Single    Spouse name: Not on file  . Number of children: Not on file  . Years of education: Not on file  . Highest education level: Not on file  Occupational History  . Occupation: retired  Tobacco Use  . Smoking status: Current Every Day Smoker    Packs/day: 0.25    Years: 50.00    Pack years: 12.50  . Smokeless tobacco: Never Used  . Tobacco comment: 3 cigarettes per day  Vaping Use  . Vaping Use: Never used  Substance and Sexual Activity  . Alcohol use: Not Currently    Alcohol/week: 35.0 standard drinks    Types: 30 Cans of beer, 5 Shots of liquor per week  . Drug use: No  . Sexual activity: Not on file  Other Topics Concern  . Not on file  Social History Narrative  . Not on file   Social Determinants of Health   Financial Resource Strain:   . Difficulty of Paying Living Expenses:   Food Insecurity:   . Worried About Charity fundraiser in the Last Year:   . [place] in the Last Year:  Transportation Needs:   . Film/video editor (Medical):   Marland Kitchen Lack of Transportation (Non-Medical):   Physical Activity:   . Days of Exercise per Week:   . Minutes of Exercise per Session:   Stress:   . Feeling of Stress :   Social Connections:   . Frequency of Communication with Friends and Family:   . Frequency of Social Gatherings with Friends and Family:   . Attends Religious Services:   . Active Member of Clubs or Organizations:   . Attends Archivist Meetings:   Marland Kitchen Marital Status:   Intimate Partner Violence:   . Fear of Current or Ex-Partner:   . Emotionally Abused:   Marland Kitchen  Physically Abused:   . Sexually Abused:     FAMILY HISTORY: Family History  Problem Relation Age of Onset  . Cancer Mother   . Cancer Father     ALLERGIES:  is allergic to 5-alpha reductase inhibitors.  MEDICATIONS:  Current Outpatient Medications  Medication Sig Dispense Refill  . dexamethasone (DECADRON) 4 MG tablet Take 2 tablets (8 mg total) by mouth daily. Start the day after carboplatin chemotherapy for 3 days. 16 tablet 1  . fentaNYL (DURAGESIC) 25 MCG/HR Place 1 patch onto the skin every 3 (three) days. 10 patch 0  . gabapentin (NEURONTIN) 300 MG capsule Take 1 capsule (300 mg total) by mouth 4 (four) times daily. 120 capsule 0  . hydrochlorothiazide (HYDRODIURIL) 25 MG tablet Take 1 tablet (25 mg total) by mouth daily. 30 tablet 12  . lidocaine-prilocaine (EMLA) cream Apply to affected area once (Patient taking differently: Apply 1 application topically daily as needed (port acess). ) 30 g 3  . lidocaine-prilocaine (EMLA) cream Apply 1 application topically as needed.    . loperamide (IMODIUM) 2 MG capsule Take 1 capsule (2 mg total) by mouth See admin instructions. With onset of loose stool, take 54m followed by 262mevery 2 hours until 12 hours have passed without loose bowel movement. Maximum: 16 mg/day 60 capsule 1  . magnesium chloride (SLOW-MAG) 64 MG TBEC SR tablet Take 1 tablet (64 mg total) by mouth daily. 30 tablet 0  . megestrol (MEGACE) 40 MG tablet Take 1 tablet (40 mg total) by mouth 2 (two) times daily. 60 tablet 0  . Multiple Vitamin (MULTIVITAMIN) capsule Take 1 capsule by mouth daily.    . ondansetron (ZOFRAN) 8 MG tablet Take 1 tablet (8 mg total) by mouth 2 (two) times daily as needed for refractory nausea / vomiting. Start on day 3 after carboplatin chemo. 30 tablet 1  . oxyCODONE (ROXICODONE) 5 MG immediate release tablet Take 1 tablet (5 mg total) by mouth every 8 (eight) hours as needed for severe pain or breakthrough pain. 60 tablet 0  . polyethylene  glycol (MIRALAX / GLYCOLAX) 17 g packet Take 17 g by mouth daily. 14 each 0  . potassium chloride (KLOR-CON) 10 MEQ tablet Take 1 tablet (10 mEq total) by mouth daily. 60 tablet 0  . prochlorperazine (COMPAZINE) 10 MG tablet Take 1 tablet (10 mg total) by mouth every 6 (six) hours as needed (Nausea or vomiting). 30 tablet 1  . senna (SENOKOT) 8.6 MG TABS tablet Take 1 tablet (8.6 mg total) by mouth daily. 120 tablet 0  . NARCAN 4 MG/0.1ML LIQD nasal spray kit 1 spray once.  (Patient not taking: Reported on 09/25/2019)     No current facility-administered medications for this visit.   Facility-Administered Medications Ordered in Other Visits  Medication  Dose Route Frequency Provider Last Rate Last Admin  . sodium chloride flush (NS) 0.9 % injection 10 mL  10 mL Intracatheter Once Earlie Server, MD         PHYSICAL EXAMINATION: ECOG PERFORMANCE STATUS: 1 - Symptomatic but completely ambulatory There were no vitals filed for this visit. There were no vitals filed for this visit.  Physical Exam Constitutional:      General: He is not in acute distress.    Appearance: He is not ill-appearing.  HENT:     Head: Normocephalic and atraumatic.  Eyes:     General: No scleral icterus. Cardiovascular:     Rate and Rhythm: Regular rhythm. Tachycardia present.     Heart sounds: Normal heart sounds.  Pulmonary:     Effort: Pulmonary effort is normal. No respiratory distress.     Breath sounds: No wheezing or rales.  Abdominal:     General: Bowel sounds are normal. There is no distension.     Palpations: Abdomen is soft.  Musculoskeletal:        General: No deformity. Normal range of motion.     Cervical back: Normal range of motion and neck supple.  Skin:    General: Skin is warm and dry.     Findings: No erythema or rash.  Neurological:     Mental Status: He is alert and oriented to person, place, and time. Mental status is at baseline.     Cranial Nerves: No cranial nerve deficit.      Coordination: Coordination normal.  Psychiatric:        Mood and Affect: Mood normal.     LABORATORY DATA:  I have reviewed the data as listed Lab Results  Component Value Date   WBC 8.6 09/12/2019   HGB 12.5 (L) 09/12/2019   HCT 37.5 (L) 09/12/2019   MCV 88.4 09/12/2019   PLT 238 09/12/2019   Recent Labs    08/15/19 0817 09/05/19 0812 09/12/19 1006  NA 137 141 138  K 3.7 3.4* 3.7  CL 103 107 105  CO2 _0 GLUCOSE 131* 132* 87  BUN 5* 9 13  CREATININE 0.53* 0.61 0.63  CALCIUM 8.6* 8.9 8.5*  GFRNONAA >60 >60 >60  GFRAA >60 >60 >60  PROT 6.8 6.8 6.0*  ALBUMIN 3.0* 3.3* 3.1*  AST 41 57* 49*  ALT 35 45* 54*  ALKPHOS 72 72 63  BILITOT 0.5 0.5 0.5   Iron/TIBC/Ferritin/ %Sat No results found for: IRON, TIBC, FERRITIN, IRONPCTSAT   RADIOGRAPHIC STUDIES: I have personally reviewed the radiological images as listed and agreed with the findings in the report. CT Chest W Contrast  Result Date: 07/04/2019 CLINICAL DATA:  Stage IV adenocarcinoma of the lung. EXAM: CT CHEST, ABDOMEN, AND PELVIS WITH CONTRAST TECHNIQUE: Multidetector CT imaging of the chest, abdomen and pelvis was performed following the standard protocol during bolus administration of intravenous contrast. CONTRAST:  86m OMNIPAQUE IOHEXOL 300 MG/ML  SOLN COMPARISON:  03/26/2019 FINDINGS: CT CHEST FINDINGS Cardiovascular: The heart size is normal. No substantial pericardial effusion. Coronary artery calcification is evident. Atherosclerotic calcification is noted in the wall of the thoracic aorta. Ascending thoracic aorta measures 4.1 cm diameter. Right-sided Port-A-Cath is looped in the right jugular vein with the tip directed centrally at the innominate vein confluence. Mediastinum/Nodes: No mediastinal lymphadenopathy. There is no hilar lymphadenopathy. There is no axillary lymphadenopathy. The esophagus has normal imaging features. Lungs/Pleura: Centrilobular and paraseptal emphysema evident. Bullous change  noted in the apices, left  greater than right. Architectural distortion with patchy ill-defined consolidative opacity in the anterior left upper lobe is similar to prior and compatible with sequelae of prior therapy. No new suspicious pulmonary nodule or mass. No pleural effusion. Musculoskeletal: Lucent lesion in the T11 vertebral body is stable. Small focus of left paraspinal soft tissue at T11 is unchanged. CT ABDOMEN PELVIS FINDINGS Hepatobiliary: Central liver mass (segment VIII) is stable at 2.9 x 2.8 cm today compared to 3.1 x 2.9 cm previously. Smaller immediately adjacent lesion measures 1.3 x 1.4 cm today (59/2) compared to 1.5 x 1.5 cm previously. Ill-defined hypoenhancing lesion posterior right liver (66/2) measures 1.5 x 1.5 cm today compared to 2.2 x 2.4 cm previously. A very subtle 7 mm hypoattenuating lesion in the posterior right liver identified on the previous study is not discernible on today's exam. Calcified gallstones again noted. No intrahepatic or extrahepatic biliary dilation. Pancreas: No focal mass lesion. No dilatation of the main duct. No intraparenchymal cyst. No peripancreatic edema. Spleen: No splenomegaly. No focal mass lesion. Adrenals/Urinary Tract: No adrenal nodule or mass. Small right renal cysts are stable. Left kidney unremarkable. No evidence for hydroureter. The urinary bladder appears normal for the degree of distention. Stomach/Bowel: Stomach is unremarkable. No gastric wall thickening. No evidence of outlet obstruction. Duodenum is normally positioned as is the ligament of Treitz. No small bowel wall thickening. No small bowel dilatation. The terminal ileum is normal. The appendix is not visualized, but there is no edema or inflammation in the region of the cecum. Left colon anastomosis again noted. Vascular/Lymphatic: There is abdominal aortic atherosclerosis without aneurysm. There is no gastrohepatic or hepatoduodenal ligament lymphadenopathy. No retroperitoneal or  mesenteric lymphadenopathy. No pelvic sidewall lymphadenopathy. Reproductive: The prostate gland and seminal vesicles are unremarkable. Other: No intraperitoneal free fluid. Musculoskeletal: No worrisome lytic or sclerotic osseous abnormality. IMPRESSION: 1. No new or progressive findings in the chest, abdomen, or pelvis. 2. Hepatic metastases have decreased in the interval and a very subtle 7 mm hypoattenuating lesion in the posterior right liver identified on the previous study is not discernible on today's exam. 3. Stable appearance of the lucent lesion in the T11 vertebral body with no change in the small focus of adjacent left paraspinal soft tissue attenuation. 4. The right-sided Port-A-Cath is looped in the right internal jugular vein with the tip directed centrally at the level of the innominate vein confluence. 5. Cholelithiasis. 6. Ascending thoracic aortic aneurysm at 4.1 cm diameter. Continued attention on follow-up recommended. 7. Aortic Atherosclerosis (ICD10-I70.0) and Emphysema (ICD10-J43.9). Electronically Signed   By: Misty Stanley M.D.   On: 07/04/2019 09:07   CT ANGIO CHEST PE W OR WO CONTRAST  Result Date: 07/24/2019 CLINICAL DATA:  Chest pain and shortness of breath. History of stage IV adenocarcinoma of the left lung. EXAM: CT ANGIOGRAPHY CHEST WITH CONTRAST TECHNIQUE: Multidetector CT imaging of the chest was performed using the standard protocol during bolus administration of intravenous contrast. Multiplanar CT image reconstructions and MIPs were obtained to evaluate the vascular anatomy. CONTRAST:  23m OMNIPAQUE IOHEXOL 350 MG/ML SOLN COMPARISON:  Chest CT 07/04/2019 and CT angio chest 03/26/2019 FINDINGS: Cardiovascular: The heart is normal in size. No pericardial effusion. Stable mild fusiform aneurysmal dilatation of the ascending thoracic aorta with maximum measurement of 4.1 cm. No dissection. Stable atherosclerotic calcifications at the aortic arch. Stable scattered coronary  artery calcifications. The pulmonary arterial tree is well opacified. No filling defects to suggest pulmonary embolism. Mediastinum/Nodes: No mediastinal or hilar mass or  adenopathy. Small scattered lymph nodes are stable. The esophagus is grossly normal. Lungs/Pleura: Stable advanced emphysematous changes and pulmonary scarring. Stable radiation changes involving the left upper lobe no findings for recurrent tumor. No new pulmonary nodules. No pleural effusions or pleural nodules. Upper Abdomen: Stable central right hepatic lobe lesion which measures 2.9 x 2.6 cm. Stable 13 mm lesion in segment 6 near the right kidney. No new hepatic lesions. No adrenal gland lesions. Musculoskeletal: No significant bony findings. There is a stable lucent lesion in the T11 vertebral body with smooth corticated margins. No new bone lesions. Review of the MIP images confirms the above findings. IMPRESSION: 1. No CT findings for pulmonary embolism. 2. Stable mild fusiform aneurysmal dilatation of the ascending thoracic aorta with maximum measurement of 4.1 cm. No dissection. Recommend continued observation. 3. Stable radiation changes involving the left upper lobe. No findings for recurrent tumor. 4. Stable advanced emphysematous changes and pulmonary scarring. No new pulmonary nodules. 5. Stable hepatic lesions.  No new or progressive findings. 6. Emphysema and aortic atherosclerosis. Aortic Atherosclerosis (ICD10-I70.0) and Emphysema (ICD10-J43.9). Electronically Signed   By: Marijo Sanes M.D.   On: 07/24/2019 15:45   CT Abdomen Pelvis W Contrast  Result Date: 07/04/2019 CLINICAL DATA:  Stage IV adenocarcinoma of the lung. EXAM: CT CHEST, ABDOMEN, AND PELVIS WITH CONTRAST TECHNIQUE: Multidetector CT imaging of the chest, abdomen and pelvis was performed following the standard protocol during bolus administration of intravenous contrast. CONTRAST:  66m OMNIPAQUE IOHEXOL 300 MG/ML  SOLN COMPARISON:  03/26/2019 FINDINGS: CT CHEST  FINDINGS Cardiovascular: The heart size is normal. No substantial pericardial effusion. Coronary artery calcification is evident. Atherosclerotic calcification is noted in the wall of the thoracic aorta. Ascending thoracic aorta measures 4.1 cm diameter. Right-sided Port-A-Cath is looped in the right jugular vein with the tip directed centrally at the innominate vein confluence. Mediastinum/Nodes: No mediastinal lymphadenopathy. There is no hilar lymphadenopathy. There is no axillary lymphadenopathy. The esophagus has normal imaging features. Lungs/Pleura: Centrilobular and paraseptal emphysema evident. Bullous change noted in the apices, left greater than right. Architectural distortion with patchy ill-defined consolidative opacity in the anterior left upper lobe is similar to prior and compatible with sequelae of prior therapy. No new suspicious pulmonary nodule or mass. No pleural effusion. Musculoskeletal: Lucent lesion in the T11 vertebral body is stable. Small focus of left paraspinal soft tissue at T11 is unchanged. CT ABDOMEN PELVIS FINDINGS Hepatobiliary: Central liver mass (segment VIII) is stable at 2.9 x 2.8 cm today compared to 3.1 x 2.9 cm previously. Smaller immediately adjacent lesion measures 1.3 x 1.4 cm today (59/2) compared to 1.5 x 1.5 cm previously. Ill-defined hypoenhancing lesion posterior right liver (66/2) measures 1.5 x 1.5 cm today compared to 2.2 x 2.4 cm previously. A very subtle 7 mm hypoattenuating lesion in the posterior right liver identified on the previous study is not discernible on today's exam. Calcified gallstones again noted. No intrahepatic or extrahepatic biliary dilation. Pancreas: No focal mass lesion. No dilatation of the main duct. No intraparenchymal cyst. No peripancreatic edema. Spleen: No splenomegaly. No focal mass lesion. Adrenals/Urinary Tract: No adrenal nodule or mass. Small right renal cysts are stable. Left kidney unremarkable. No evidence for hydroureter.  The urinary bladder appears normal for the degree of distention. Stomach/Bowel: Stomach is unremarkable. No gastric wall thickening. No evidence of outlet obstruction. Duodenum is normally positioned as is the ligament of Treitz. No small bowel wall thickening. No small bowel dilatation. The terminal ileum is normal. The appendix  is not visualized, but there is no edema or inflammation in the region of the cecum. Left colon anastomosis again noted. Vascular/Lymphatic: There is abdominal aortic atherosclerosis without aneurysm. There is no gastrohepatic or hepatoduodenal ligament lymphadenopathy. No retroperitoneal or mesenteric lymphadenopathy. No pelvic sidewall lymphadenopathy. Reproductive: The prostate gland and seminal vesicles are unremarkable. Other: No intraperitoneal free fluid. Musculoskeletal: No worrisome lytic or sclerotic osseous abnormality. IMPRESSION: 1. No new or progressive findings in the chest, abdomen, or pelvis. 2. Hepatic metastases have decreased in the interval and a very subtle 7 mm hypoattenuating lesion in the posterior right liver identified on the previous study is not discernible on today's exam. 3. Stable appearance of the lucent lesion in the T11 vertebral body with no change in the small focus of adjacent left paraspinal soft tissue attenuation. 4. The right-sided Port-A-Cath is looped in the right internal jugular vein with the tip directed centrally at the level of the innominate vein confluence. 5. Cholelithiasis. 6. Ascending thoracic aortic aneurysm at 4.1 cm diameter. Continued attention on follow-up recommended. 7. Aortic Atherosclerosis (ICD10-I70.0) and Emphysema (ICD10-J43.9). Electronically Signed   By: Misty Stanley M.D.   On: 07/04/2019 09:07   US Venous Img Lower Bilateral (DVT)  Result Date: 08/02/2019 CLINICAL DATA:  Bilateral lower extremity pain and edema for the past 2 weeks. History of smoking. History of lung cancer. Evaluate for DVT. EXAM: BILATERAL LOWER  EXTREMITY VENOUS DOPPLER ULTRASOUND TECHNIQUE: Gray-scale sonography with graded compression, as well as color Doppler and duplex ultrasound were performed to evaluate the lower extremity deep venous systems from the level of the common femoral vein and including the common femoral, femoral, profunda femoral, popliteal and calf veins including the posterior tibial, peroneal and gastrocnemius veins when visible. The superficial great saphenous vein was also interrogated. Spectral Doppler was utilized to evaluate flow at rest and with distal augmentation maneuvers in the common femoral, femoral and popliteal veins. COMPARISON:  None. FINDINGS: RIGHT LOWER EXTREMITY Common Femoral Vein: No evidence of thrombus. Normal compressibility, respiratory phasicity and response to augmentation. Saphenofemoral Junction: No evidence of thrombus. Normal compressibility and flow on color Doppler imaging. Profunda Femoral Vein: No evidence of thrombus. Normal compressibility and flow on color Doppler imaging. Femoral Vein: No evidence of thrombus. Normal compressibility, respiratory phasicity and response to augmentation. Popliteal Vein: No evidence of thrombus. Normal compressibility, respiratory phasicity and response to augmentation. Calf Veins: No evidence of thrombus. Normal compressibility and flow on color Doppler imaging. Superficial Great Saphenous Vein: No evidence of thrombus. Normal compressibility. Venous Reflux:  None. Other Findings:  None. LEFT LOWER EXTREMITY Common Femoral Vein: No evidence of thrombus. Normal compressibility, respiratory phasicity and response to augmentation. Saphenofemoral Junction: No evidence of thrombus. Normal compressibility and flow on color Doppler imaging. Profunda Femoral Vein: No evidence of thrombus. Normal compressibility and flow on color Doppler imaging. Femoral Vein: No evidence of thrombus. Normal compressibility, respiratory phasicity and response to augmentation. Popliteal  Vein: No evidence of thrombus. Normal compressibility, respiratory phasicity and response to augmentation. Calf Veins: No evidence of thrombus. Normal compressibility and flow on color Doppler imaging. Superficial Great Saphenous Vein: No evidence of thrombus. Normal compressibility. Venous Reflux:  None. Other Findings:  None. IMPRESSION: No evidence of DVT within either lower extremity. Electronically Signed   By: Sandi Mariscal M.D.   On: 08/02/2019 10:36   ECHOCARDIOGRAM COMPLETE  Result Date: 09/14/2019    ECHOCARDIOGRAM REPORT   Patient Name:   Benigno Check. Date of Exam: 09/14/2019 Medical Rec #:  [id number]        Height:       65.0 in Accession #:    [id number]       Weight:       133.2 lb Date of Birth:  21-Apr-1950         BSA:          1.664 m Patient Age:    34 years         BP:           132/90 mmHg Patient Gender: M                HR:           103 bpm. Exam Location:  [facility] Procedure: 2D Echo, Cardiac Doppler and Color Doppler Indications:    R06.02 SOB  History:        Patient has no prior history of Echocardiogram examinations.                 Thoracic aortic aneurysm, COPD, Arrythmias:Tachycardia,                 Signs/Symptoms:Shortness of Breath; Risk Factors:Former Smoker                 and Hypertension.  Sonographer:    Pilar Jarvis RDMS, RVT, RDCS Referring Phys: [id number] BRIAN AGBOR-ETANG IMPRESSIONS  1. Left ventricular ejection fraction, by estimation, is 55%. The left ventricle has normal function. The left ventricle has no regional wall motion abnormalities. There is mild left ventricular hypertrophy. Left ventricular diastolic parameters are consistent with Grade I diastolic dysfunction (impaired relaxation).  2. Right ventricular systolic function is normal. The right ventricular size is normal. There is normal pulmonary artery systolic pressure. The estimated right ventricular systolic pressure is 52.7 mmHg.  3. Tricuspid valve regurgitation is mild to moderate.  4. There is  dilatation of the aortic root measuring 36 mm. Ascending aorta 4.0 cm FINDINGS  Left Ventricle: Left ventricular ejection fraction, by estimation, is 55 to 60%. The left ventricle has normal function. The left ventricle has no regional wall motion abnormalities. The left ventricular internal cavity size was normal in size. There is  mild left ventricular hypertrophy. Left ventricular diastolic parameters are consistent with Grade I diastolic dysfunction (impaired relaxation). Right Ventricle: The right ventricular size is normal. No increase in right ventricular wall thickness. Right ventricular systolic function is normal. There is normal pulmonary artery systolic pressure. The tricuspid regurgitant velocity is 2.32 m/s, and  with an assumed right atrial pressure of 10 mmHg, the estimated right ventricular systolic pressure is 78.2 mmHg. Left Atrium: Left atrial size was normal in size. Right Atrium: Right atrial size was normal in size. Pericardium: There is no evidence of pericardial effusion. Mitral Valve: The mitral valve is normal in structure. Normal mobility of the mitral valve leaflets. No evidence of mitral valve regurgitation. No evidence of mitral valve stenosis. Tricuspid Valve: The tricuspid valve is normal in structure. Tricuspid valve regurgitation is mild to moderate. No evidence of tricuspid stenosis. Aortic Valve: The aortic valve is normal in structure. Aortic valve regurgitation is not visualized. Mild to moderate aortic valve sclerosis/calcification is present, without any evidence of aortic stenosis. Aortic valve mean gradient measures 2.0 mmHg. Aortic valve peak gradient measures 3.8 mmHg. Aortic valve area, by VTI measures 4.14 cm. Pulmonic Valve: The pulmonic valve was normal in structure. Pulmonic valve regurgitation is not visualized. No evidence of pulmonic stenosis. Aorta: The aortic root is normal  in size and structure. There is dilatation of the aortic root measuring 36 mm. Venous:  The inferior vena cava is normal in size with greater than 50% respiratory variability, suggesting right atrial pressure of 3 mmHg. IAS/Shunts: No atrial level shunt detected by color flow Doppler.  LEFT VENTRICLE PLAX 2D LVIDd:         2.90 cm     Diastology LVIDs:         2.00 cm     LV e' lateral:   11.10 cm/s LV PW:         0.80 cm     LV E/e' lateral: 4.4 LV IVS:        0.80 cm     LV e' medial:    6.74 cm/s LVOT diam:     2.00 cm     LV E/e' medial:  7.3 LV SV:         47 LV SV Index:   28 LVOT Area:     3.14 cm  LV Volumes (MOD) LV vol d, MOD A2C: 37.6 ml LV vol d, MOD A4C: 34.6 ml LV vol s, MOD A2C: 20.7 ml LV vol s, MOD A4C: 19.0 ml LV SV MOD A2C:     16.9 ml LV SV MOD A4C:     34.6 ml LV SV MOD BP:      17.9 ml RIGHT VENTRICLE            IVC RV Basal diam:  2.50 cm    IVC diam: 0.60 cm RV S prime:     7.51 cm/s TAPSE (M-mode): 1.2 cm LEFT ATRIUM             Index       RIGHT ATRIUM          Index LA diam:        2.60 cm 1.56 cm/m  RA Area:     7.80 cm LA Vol (A2C):   19.4 ml 11.66 ml/m RA Volume:   14.70 ml 8.83 ml/m LA Vol (A4C):   11.9 ml 7.15 ml/m LA Biplane Vol: 16.7 ml 10.03 ml/m  AORTIC VALVE                   PULMONIC VALVE AV Area (Vmax):    2.87 cm    PV Vmax:       0.54 m/s AV Area (Vmean):   3.04 cm    PV Peak grad:  1.2 mmHg AV Area (VTI):     4.14 cm AV Vmax:           97.50 cm/s AV Vmean:          60.900 cm/s AV VTI:            0.113 m AV Peak Grad:      3.8 mmHg AV Mean Grad:      2.0 mmHg LVOT Vmax:         89.20 cm/s LVOT Vmean:        58.900 cm/s LVOT VTI:          0.149 m LVOT/AV VTI ratio: 1.32  AORTA Ao Root diam: 3.60 cm Ao Asc diam:  4.00 cm Ao Arch diam: 2.9 cm MITRAL VALVE               TRICUSPID VALVE MV Area (PHT): 9.73 cm    TR Peak grad:   21.5 mmHg MV Decel Time: 78 msec     TR Vmax:  232.00 cm/s MV E velocity: 49.30 cm/s MV A velocity: 81.40 cm/s  SHUNTS MV E/A ratio:  0.61        Systemic VTI:  0.15 m                            Systemic Diam: 2.00 cm Ida Rogue MD Electronically signed by Ida Rogue MD Signature Date/Time: 09/14/2019/8:21:14 PM    Final       ASSESSMENT & PLAN:  1. Encounter for antineoplastic chemotherapy   2. Stage IV adenocarcinoma of lung, left ([place])   3. 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 Hypomagnesemia   5. Other fatigue   6. Weight loss    #Stage IV Lung adenocarcinoma, Stage IV HCC-07/17/2018 paraspinal soft tissue biopsy Patient is on palliative chemotherapy with carboplatin, Taxol, bevacizumab, Tecentriq. Patient tolerates chemotherapy with some difficulties. Counts are reviewed and discussed with patient.  Acceptable to proceed with treatment today. Proceed with Zometa today.  Calcium level is stable.  #Chronic tachycardia, evaluated by cardiology.  2D echo was done. Patient has been started on hydrochlorothiazide #Weight loss, continue follow-up with nutritionist.  Continue nutrition supplementation.  Continue Megace. #Severe hypokalemia, potassium level has normalized to 3.4.  Recommend patient to continue potassium 10 mEq daily. #Weight loss, likely due to decreased oral intake. Continue Megace. #Fatigue, check TSH in the morning cortisol level. #Chemotherapy-induced neuropathy, bilateral hands and feet.   Taxol to 135 mg/m due to neuropathy. Continue gabapentin and increase to, continue 4 times daily. # Hypomagnesia, continue slow Mag 1 tab daily, magnesium level is 1.7 today.  Stable. We spent sufficient time to discuss many aspect of care, questions were answered to patient's satisfaction. All questions were answered. The patient knows to call the clinic with any problems questions or concerns.   Earlie Server, MD, PhD 09/26/2019

## 2019-09-26 NOTE — Progress Notes (Signed)
Samuel Hood  [phone] [phone]   Name: Samuel Hood. Date: 09/26/2019 MRN: [id number]  DOB: [birth year]  Patient Care Team: Jerrol Banana., MD as PCP - General (Family Medicine) Kate Sable, MD as PCP - Cardiology (Cardiology) Telford Nab, RN as Registered Nurse Clent Jacks, RN as Registered Nurse    REASON FOR CONSULTATION: Samuel Hood. is a 69 y.o. male with multiple medical problems including stage IV adenocarcinoma of the lung (diagnosed 03/30/2019) metastatic to liver and bone and chest wall, also with stage IV HCC (diagnosed 07/17/2018). PMH also notable for HCV, history of alcohol use/tobacco abuse. Palliative care was consulted to help address goals and manage ongoing symptoms..    SOCIAL HISTORY:     reports that he has been smoking. He has a 12.50 pack-year smoking history. He has never used smokeless tobacco. He reports previous alcohol use of about 35.0 standard drinks of alcohol per week. He reports that he does not use drugs.   Patient is a widower after having been married 27 years. He lives at home with a girlfriend. He has 3 daughters and a son. His son is currently incarcerated and he reports having a strained relationship with adaughter due to substance abuse. His daughter, Samuel Hood, is most involved. Patient previously worked in [employer] (medical).  ADVANCE DIRECTIVES:  On file  CODE STATUS:   PAST MEDICAL HISTORY: Past Medical History:  Diagnosis Date   Hepatocellular carcinoma metastatic to left lung ([place]) 07/24/2018   History of angiography    left lower extremity   Hypertension    Small bowel obstruction ([place])     PAST SURGICAL HISTORY:  Past Surgical History:  Procedure Laterality Date   COLON SURGERY     COLONOSCOPY W/ POLYPECTOMY     COLONOSCOPY WITH PROPOFOL N/A 01/31/2018   Procedure: COLONOSCOPY WITH PROPOFOL;  Surgeon: Toledo, Benay Pike, MD;   Location: [facility] ENDOSCOPY;  Service: Gastroenterology;  Laterality: N/A;   debridement fasciotomy leg, left Left 03/19/2016   LAPAROTOMY N/A 09/27/2014   Procedure: EXPLORATORY LAPAROTOMY;  Surgeon: Dia Crawford III, MD;  Location: [facility] ORS;  Service: General;  Laterality: N/A;   PORTA CATH INSERTION N/A 04/10/2019   Procedure: PORTA CATH INSERTION;  Surgeon: Katha Cabal, MD;  Location: [facility];  Service: Cardiovascular;  Laterality: N/A;    HEMATOLOGY/ONCOLOGY HISTORY:  Oncology History Overview Note  Samuel Hood. is a  69 y.o.  male with PMH listed below was seen in consultation at the request of  Pollak, Adriana M, PA-C  for evaluation of lung nodule in the liver lesion. Patient has a past medical history of hypertension, alcohol abuse, smoking emphysema.  He has had serial CTs done in the past.  CT images were independently reviewed by me. 07/26/2017 CT chest with contrast showed extensive pleural-parenchymal scarring within the left upper lobe with several areas of soft tissue nodularity measuring up to 1.6 cm.  In this patient who is at increased risk of lung cancer further investigation with PET scan is recommended. Small chronic appearance loculated hydropneumothorax overlies the left apex. Slowly enlarging lesion within the central portion of the right lobe of liver identified. Per note, patient supposed to have additional work-up done in December repeat CT scan which he did not   Patient was recently seen by primary care provider and has subsequent image done for follow-up. CT on 06/30/2018 showed multiple significantly enlarged paraspinal mass  are noted concerning.  The largest measures 3.8 x 0.5 cm and there appears to be a lytic destruction of the adjacent T11 vertebral body.  Also noted is new solid density measuring 17 x 12 mm in the pleural parenchymal density in the left upper lobe noted on prior exam.  Concerning for malignancy.  4.4 ascending thoracic aortic  aneurysm.  Recommend annual imaging.   Patient had PET scan done on 07/06/2018 Which showed there are 2 FDG avid lesion within the left upper lobe worrisome for primary lung neoplasm.  There is evidence of chest wall involvement.  Metastasis to the posterior mediastinum is identified with extension into T11 vertebra and possible involvement of the left T12 neural foramina.   Patient reports left shoulder pain.  Smoking half a pack a day not motivated with further questioning quitting Denies weight loss, hemoptysis, cough.  Chronic shortness of breath with exertion. He drinks alcohol daily. Lives with his girlfriend.  He has 5 adult kids   #Hepatitis C   # 6/1?2020  CT-guided biopsy of left-sided posterior mediastinal soft tissue adjacent to T10/11. Patient's case was discussed on tumor board. Consensus was reached that [facility] versus primary lung cancer with hepatoid adenocarcinoma features. Consensus was to proceed with liver biopsy first as liver mass was not FDG avid on PET scan.  Patient underwent liver biopsy and the present to discuss pathology reports.   ## 08/08/2018 Liver mass biopsy showed fragments of necrotic and calcified tissue with adjacent fibrous capsule Scant viable nonneoplastic liver tissue with steatosis, inflammation and non specific fibrosis.  # 08/28/2018 CT guided left upper lobe lung mass biopsy showed poorly differentiated adenocarcinoma of lung origin.  6/19-08/16/2018  Finished palliative radiation to paraspinal soft tissue mass.   Hepatocellular carcinoma metastatic to left lung ([place])  07/24/2018 Initial Diagnosis   Hepatocellular carcinoma metastatic to left lung ([place])   04/16/2019 -  Chemotherapy   The patient had dexamethasone (DECADRON) 4 MG tablet, 8 mg, Oral, Daily, 1 of 1 cycle, Start date: 09/05/2019, End date: -- palonosetron (ALOXI) injection 0.25 mg, 0.25 mg, Intravenous,  Once, 6 of 6 cycles Administration: 0.25 mg (04/16/2019), 0.25 mg (05/09/2019), 0.25 mg  (05/30/2019), 0.25 mg (06/20/2019), 0.25 mg (08/15/2019), 0.25 mg (07/18/2019) CARBOplatin (PARAPLATIN) 470 mg in sodium chloride 0.9 % 250 mL chemo infusion, 470 mg (100 % of original dose 465.5 mg), Intravenous,  Once, 6 of 6 cycles Dose modification:   (original dose 465.5 mg, Cycle 1) Administration: 470 mg (04/16/2019), 470 mg (05/09/2019), 470 mg (05/30/2019), 470 mg (06/20/2019), 420 mg (08/15/2019), 420 mg (07/18/2019) fosaprepitant (EMEND) 150 mg in sodium chloride 0.9 % 145 mL IVPB, 150 mg, Intravenous,  Once, 6 of 6 cycles Administration: 150 mg (04/16/2019), 150 mg (05/09/2019), 150 mg (05/30/2019), 150 mg (06/20/2019), 150 mg (08/15/2019), 150 mg (07/18/2019) PACLitaxel (TAXOL) 354 mg in sodium chloride 0.9 % 500 mL chemo infusion (> 21m/m2), 200 mg/m2 = 354 mg, Intravenous,  Once, 6 of 6 cycles Dose modification: 175 mg/m2 (original dose 200 mg/m2, Cycle 6, Reason: Provider Judgment), 135 mg/m2 (original dose 200 mg/m2, Cycle 6, Reason: Other (see comments), Comment: neuropathy) Administration: 354 mg (04/16/2019), 354 mg (05/09/2019), 354 mg (05/30/2019), 354 mg (06/20/2019), 240 mg (08/15/2019), 306 mg (07/18/2019) atezolizumab (TECENTRIQ) 1,200 mg in sodium chloride 0.9 % 250 mL chemo infusion, 1,200 mg, Intravenous, Once, 8 of 10 cycles Administration: 1,200 mg (05/09/2019), 1,200 mg (05/30/2019), 1,200 mg (06/20/2019), 1,200 mg (08/15/2019), 1,200 mg (09/05/2019), 1,200 mg (04/18/2019), 1,200 mg (07/18/2019), 1,200 mg (  09/26/2019) bevacizumab-bvzr (ZIRABEV) 1,000 mg in sodium chloride 0.9 % 100 mL chemo infusion, 15 mg/kg = 1,000 mg, Intravenous,  Once, 8 of 10 cycles Administration: 1,000 mg (05/09/2019), 1,000 mg (05/30/2019), 1,000 mg (06/20/2019), 1,000 mg (08/15/2019), 1,000 mg (09/05/2019), 1,000 mg (04/18/2019), 1,000 mg (07/18/2019), 900 mg (09/26/2019)  for chemotherapy treatment.    Stage IV adenocarcinoma of lung, left ([place])  09/02/2018 Initial Diagnosis   Primary lung adenocarcinoma, left ([place])   04/16/2019 -  Chemotherapy    The patient had dexamethasone (DECADRON) 4 MG tablet, 8 mg, Oral, Daily, 1 of 1 cycle, Start date: 09/05/2019, End date: -- palonosetron (ALOXI) injection 0.25 mg, 0.25 mg, Intravenous,  Once, 6 of 6 cycles Administration: 0.25 mg (04/16/2019), 0.25 mg (05/09/2019), 0.25 mg (05/30/2019), 0.25 mg (06/20/2019), 0.25 mg (08/15/2019), 0.25 mg (07/18/2019) CARBOplatin (PARAPLATIN) 470 mg in sodium chloride 0.9 % 250 mL chemo infusion, 470 mg (100 % of original dose 465.5 mg), Intravenous,  Once, 6 of 6 cycles Dose modification:   (original dose 465.5 mg, Cycle 1) Administration: 470 mg (04/16/2019), 470 mg (05/09/2019), 470 mg (05/30/2019), 470 mg (06/20/2019), 420 mg (08/15/2019), 420 mg (07/18/2019) fosaprepitant (EMEND) 150 mg in sodium chloride 0.9 % 145 mL IVPB, 150 mg, Intravenous,  Once, 6 of 6 cycles Administration: 150 mg (04/16/2019), 150 mg (05/09/2019), 150 mg (05/30/2019), 150 mg (06/20/2019), 150 mg (08/15/2019), 150 mg (07/18/2019) PACLitaxel (TAXOL) 354 mg in sodium chloride 0.9 % 500 mL chemo infusion (> 21m/m2), 200 mg/m2 = 354 mg, Intravenous,  Once, 6 of 6 cycles Dose modification: 175 mg/m2 (original dose 200 mg/m2, Cycle 6, Reason: Provider Judgment), 135 mg/m2 (original dose 200 mg/m2, Cycle 6, Reason: Other (see comments), Comment: neuropathy) Administration: 354 mg (04/16/2019), 354 mg (05/09/2019), 354 mg (05/30/2019), 354 mg (06/20/2019), 240 mg (08/15/2019), 306 mg (07/18/2019) atezolizumab (TECENTRIQ) 1,200 mg in sodium chloride 0.9 % 250 mL chemo infusion, 1,200 mg, Intravenous, Once, 8 of 10 cycles Administration: 1,200 mg (05/09/2019), 1,200 mg (05/30/2019), 1,200 mg (06/20/2019), 1,200 mg (08/15/2019), 1,200 mg (09/05/2019), 1,200 mg (04/18/2019), 1,200 mg (07/18/2019), 1,200 mg (09/26/2019) bevacizumab-bvzr (ZIRABEV) 1,000 mg in sodium chloride 0.9 % 100 mL chemo infusion, 15 mg/kg = 1,000 mg, Intravenous,  Once, 8 of 10 cycles Administration: 1,000 mg (05/09/2019), 1,000 mg (05/30/2019), 1,000 mg (06/20/2019), 1,000 mg  (08/15/2019), 1,000 mg (09/05/2019), 1,000 mg (04/18/2019), 1,000 mg (07/18/2019), 900 mg (09/26/2019)  for chemotherapy treatment.    [place]  12/16/2018 Initial Diagnosis   [place]cer, [place] to bone [place]   04/16/2019 -  Chemotherapy   The patient had dexamethasone (DECADRON) 4 MG tablet, 8 mg, Oral, Daily, 1 of 1 cycle, Start date: 09/05/2019, End date: -- palonosetron (ALOXI) injection 0.25 mg, 0.25 mg, Intravenous,  Once, 6 of 6 cycles Administration: 0.25 mg (04/16/2019), 0.25 mg (05/09/2019), 0.25 mg (05/30/2019), 0.25 mg (06/20/2019), 0.25 mg (08/15/2019), 0.25 mg (07/18/2019) CARBOplatin (PARAPLATIN) 470 mg in sodium chloride 0.9 % 250 mL chemo infusion, 470 mg (100 % of original dose 465.5 mg), Intravenous,  Once, 6 of 6 cycles Dose modification:   (original dose 465.5 mg, Cycle 1) Administration: 470 mg (04/16/2019), 470 mg (05/09/2019), 470 mg (05/30/2019), 470 mg (06/20/2019), 420 mg (08/15/2019), 420 mg (07/18/2019) fosaprepitant (EMEND) 150 mg in sodium chloride 0.9 % 145 mL IVPB, 150 mg, Intravenous,  Once, 6 of 6 cycles Administration: 150 mg (04/16/2019), 150 mg (05/09/2019), 150 mg (05/30/2019), 150 mg (06/20/2019), 150 mg (08/15/2019), 150 mg (07/18/2019) PACLitaxel (TAXOL) 354 mg in sodium chloride 0.9 %  500 mL chemo infusion (> 75m/m2), 200 mg/m2 = 354 mg, Intravenous,  Once, 6 of 6 cycles Dose modification: 175 mg/m2 (original dose 200 mg/m2, Cycle 6, Reason: Provider Judgment), 135 mg/m2 (original dose 200 mg/m2, Cycle 6, Reason: Other (see comments), Comment: neuropathy) Administration: 354 mg (04/16/2019), 354 mg (05/09/2019), 354 mg (05/30/2019), 354 mg (06/20/2019), 240 mg (08/15/2019), 306 mg (07/18/2019) atezolizumab (TECENTRIQ) 1,200 mg in sodium chloride 0.9 % 250 mL chemo infusion, 1,200 mg, Intravenous, Once, 8 of 10 cycles Administration: 1,200 mg (05/09/2019), 1,200 mg (05/30/2019), 1,200 mg (06/20/2019), 1,200 mg (08/15/2019), 1,200 mg (09/05/2019), 1,200 mg (04/18/2019), 1,200 mg  (07/18/2019), 1,200 mg (09/26/2019) bevacizumab-bvzr (ZIRABEV) 1,000 mg in sodium chloride 0.9 % 100 mL chemo infusion, 15 mg/kg = 1,000 mg, Intravenous,  Once, 8 of 10 cycles Administration: 1,000 mg (05/09/2019), 1,000 mg (05/30/2019), 1,000 mg (06/20/2019), 1,000 mg (08/15/2019), 1,000 mg (09/05/2019), 1,000 mg (04/18/2019), 1,000 mg (07/18/2019), 900 mg (09/26/2019)  for chemotherapy treatment.      ALLERGIES:  is allergic to 5-alpha reductase inhibitors.  MEDICATIONS:  Current Outpatient Medications  Medication Sig Dispense Refill   dexamethasone (DECADRON) 4 MG tablet Take 2 tablets (8 mg total) by mouth daily. Start the day after carboplatin chemotherapy for 3 days. 16 tablet 1   fentaNYL (DURAGESIC) 25 MCG/HR Place 1 patch onto the skin every 3 (three) days. 10 patch 0   gabapentin (NEURONTIN) 300 MG capsule Take 1 capsule (300 mg total) by mouth 4 (four) times daily. 120 capsule 0   hydrochlorothiazide (HYDRODIURIL) 25 MG tablet Take 1 tablet (25 mg total) by mouth daily. 30 tablet 12   lidocaine-prilocaine (EMLA) cream Apply to affected area once (Patient taking differently: Apply 1 application topically daily as needed (port acess). ) 30 g 3   lidocaine-prilocaine (EMLA) cream Apply 1 application topically as needed.     loperamide (IMODIUM) 2 MG capsule Take 1 capsule (2 mg total) by mouth See admin instructions. With onset of loose stool, take 437mfollowed by 57m84mvery 2 hours until 12 hours have passed without loose bowel movement. Maximum: 16 mg/day 60 capsule 1   magnesium chloride (SLOW-MAG) 64 MG TBEC SR tablet Take 1 tablet (64 mg total) by mouth daily. 30 tablet 0   megestrol (MEGACE) 40 MG tablet Take 1 tablet (40 mg total) by mouth 2 (two) times daily. 60 tablet 0   Multiple Vitamin (MULTIVITAMIN) capsule Take 1 capsule by mouth daily.     NARCAN 4 MG/0.1ML LIQD nasal spray kit 1 spray once.  (Patient not taking: Reported on 09/25/2019)     ondansetron (ZOFRAN) 8 MG tablet  Take 1 tablet (8 mg total) by mouth 2 (two) times daily as needed for refractory nausea / vomiting. Start on day 3 after carboplatin chemo. 30 tablet 1   oxyCODONE (ROXICODONE) 5 MG immediate release tablet Take 1 tablet (5 mg total) by mouth every 8 (eight) hours as needed for severe pain or breakthrough pain. 60 tablet 0   polyethylene glycol (MIRALAX / GLYCOLAX) 17 g packet Take 17 g by mouth daily. 14 each 0   potassium chloride (KLOR-CON) 10 MEQ tablet Take 1 tablet (10 mEq total) by mouth daily. 60 tablet 0   prochlorperazine (COMPAZINE) 10 MG tablet Take 1 tablet (10 mg total) by mouth every 6 (six) hours as needed (Nausea or vomiting). 30 tablet 1   senna (SENOKOT) 8.6 MG TABS tablet Take 1 tablet (8.6 mg total) by mouth daily. 120 tablet 0   No current  facility-administered medications for this visit.    VITAL SIGNS: There were no vitals taken for this visit. There were no vitals filed for this visit.  Estimated body mass index is 21.42 kg/m as calculated from the following:   Height as of 09/18/19: 5' 5" (1.651 m).   Weight as of an earlier encounter on 09/26/19: 128 lb 11.2 oz (58.4 kg).  LABS: CBC:    Component Value Date/Time   WBC 5.1 09/26/2019 0807   HGB 13.8 09/26/2019 0807   HGB 14.6 06/23/2018 1100   HCT 40.8 09/26/2019 0807   HCT 43.2 06/23/2018 1100   PLT 257 09/26/2019 0807   PLT 297 06/23/2018 1100   MCV 87.7 09/26/2019 0807   MCV 96 06/23/2018 1100   MCV 88 09/14/2013 0511   NEUTROABS 3.2 09/26/2019 0807   NEUTROABS 2.8 06/23/2018 1100   NEUTROABS 12.4 (H) 09/14/2013 0511   LYMPHSABS 1.2 09/26/2019 0807   LYMPHSABS 1.8 06/23/2018 1100   LYMPHSABS 2.3 09/14/2013 0511   MONOABS 0.5 09/26/2019 0807   MONOABS 1.1 (H) 09/14/2013 0511   EOSABS 0.2 09/26/2019 0807   EOSABS 0.1 06/23/2018 1100   EOSABS 0.2 09/14/2013 0511   BASOSABS 0.0 09/26/2019 0807   BASOSABS 0.0 06/23/2018 1100   BASOSABS 0.0 09/14/2013 0511   Comprehensive Metabolic Panel:      Component Value Date/Time   NA 139 09/26/2019 0807   NA 136 06/23/2018 1100   NA 139 09/13/2013 0408   K 3.3 (L) 09/26/2019 0807   K 3.3 (L) 09/13/2013 0408   CL 100 09/26/2019 0807   CL 105 09/13/2013 0408   CO2 27 09/26/2019 0807   CO2 27 09/13/2013 0408   BUN 17 09/26/2019 0807   BUN 6 (L) 06/23/2018 1100   BUN 4 (L) 09/13/2013 0408   CREATININE 0.77 09/26/2019 0807   CREATININE 0.67 09/13/2013 0408   GLUCOSE 140 (H) 09/26/2019 0807   GLUCOSE 108 (H) 09/13/2013 0408   CALCIUM 9.3 09/26/2019 0807   CALCIUM 8.1 (L) 09/13/2013 0408   Hood 58 (H) 09/26/2019 0807   Hood 30 09/13/2013 0408   ALT 50 (H) 09/26/2019 0807   ALT 27 09/13/2013 0408   ALKPHOS 78 09/26/2019 0807   ALKPHOS 218 (H) 09/13/2013 0408   BILITOT 0.4 09/26/2019 0807   BILITOT 0.5 06/23/2018 1100   BILITOT 0.9 09/13/2013 0408   PROT 7.0 09/26/2019 0807   PROT 7.4 06/23/2018 1100   PROT 6.2 (L) 09/13/2013 0408   ALBUMIN 3.4 (L) 09/26/2019 0807   ALBUMIN 4.0 06/23/2018 1100   ALBUMIN 1.9 (L) 09/13/2013 0408    RADIOGRAPHIC STUDIES: ECHOCARDIOGRAM COMPLETE  Result Date: 09/14/2019    ECHOCARDIOGRAM REPORT   Patient Name:   Samuel Brander. Date of Exam: 09/14/2019 Medical Rec #:  [id number]        Height:       65.0 in Accession #:    [id number]       Weight:       133.2 lb Date of Birth:  [birth year]         BSA:          1.664 m Patient Age:    69 years         BP:           132/90 mmHg Patient Gender: M                HR:           103 bpm.  Exam Location:  [place] Procedure: 2D Echo, Cardiac Doppler and Color Doppler Indications:    R06.02 SOB  History:        Patient has no prior history of Echocardiogram examinations.                 Thoracic aortic aneurysm, COPD, Arrythmias:Tachycardia,                 Signs/Symptoms:Shortness of Breath; Risk Factors:Former Smoker                 and Hypertension.  Sonographer:    Pilar Jarvis RDMS, RVT, RDCS Referring Phys: [id number] BRIAN AGBOR-ETANG IMPRESSIONS  1. Left  ventricular ejection fraction, by estimation, is 55%. The left ventricle has normal function. The left ventricle has no regional wall motion abnormalities. There is mild left ventricular hypertrophy. Left ventricular diastolic parameters are consistent with Grade I diastolic dysfunction (impaired relaxation).  2. Right ventricular systolic function is normal. The right ventricular size is normal. There is normal pulmonary artery systolic pressure. The estimated right ventricular systolic pressure is 86.7 mmHg.  3. Tricuspid valve regurgitation is mild to moderate.  4. There is dilatation of the aortic root measuring 36 mm. Ascending aorta 4.0 cm FINDINGS  Left Ventricle: Left ventricular ejection fraction, by estimation, is 55 to 60%. The left ventricle has normal function. The left ventricle has no regional wall motion abnormalities. The left ventricular internal cavity size was normal in size. There is  mild left ventricular hypertrophy. Left ventricular diastolic parameters are consistent with Grade I diastolic dysfunction (impaired relaxation). Right Ventricle: The right ventricular size is normal. No increase in right ventricular wall thickness. Right ventricular systolic function is normal. There is normal pulmonary artery systolic pressure. The tricuspid regurgitant velocity is 2.32 m/s, and  with an assumed right atrial pressure of 10 mmHg, the estimated right ventricular systolic pressure is 61.9 mmHg. Left Atrium: Left atrial size was normal in size. Right Atrium: Right atrial size was normal in size. Pericardium: There is no evidence of pericardial effusion. Mitral Valve: The mitral valve is normal in structure. Normal mobility of the mitral valve leaflets. No evidence of mitral valve regurgitation. No evidence of mitral valve stenosis. Tricuspid Valve: The tricuspid valve is normal in structure. Tricuspid valve regurgitation is mild to moderate. No evidence of tricuspid stenosis. Aortic Valve: The aortic  valve is normal in structure. Aortic valve regurgitation is not visualized. Mild to moderate aortic valve sclerosis/calcification is present, without any evidence of aortic stenosis. Aortic valve mean gradient measures 2.0 mmHg. Aortic valve peak gradient measures 3.8 mmHg. Aortic valve area, by VTI measures 4.14 cm. Pulmonic Valve: The pulmonic valve was normal in structure. Pulmonic valve regurgitation is not visualized. No evidence of pulmonic stenosis. Aorta: The aortic root is normal in size and structure. There is dilatation of the aortic root measuring 36 mm. Venous: The inferior vena cava is normal in size with greater than 50% respiratory variability, suggesting right atrial pressure of 3 mmHg. IAS/Shunts: No atrial level shunt detected by color flow Doppler.  LEFT VENTRICLE PLAX 2D LVIDd:         2.90 cm     Diastology LVIDs:         2.00 cm     LV e' lateral:   11.10 cm/s LV PW:         0.80 cm     LV E/e' lateral: 4.4 LV IVS:        0.80 cm  LV e' medial:    6.74 cm/s LVOT diam:     2.00 cm     LV E/e' medial:  7.3 LV SV:         47 LV SV Index:   28 LVOT Area:     3.14 cm  LV Volumes (MOD) LV vol d, MOD A2C: 37.6 ml LV vol d, MOD A4C: 34.6 ml LV vol s, MOD A2C: 20.7 ml LV vol s, MOD A4C: 19.0 ml LV SV MOD A2C:     16.9 ml LV SV MOD A4C:     34.6 ml LV SV MOD BP:      17.9 ml RIGHT VENTRICLE            IVC RV Basal diam:  2.50 cm    IVC diam: 0.60 cm RV S prime:     7.51 cm/s TAPSE (M-mode): 1.2 cm LEFT ATRIUM             Index       RIGHT ATRIUM          Index LA diam:        2.60 cm 1.56 cm/m  RA Area:     7.80 cm LA Vol (A2C):   19.4 ml 11.66 ml/m RA Volume:   14.70 ml 8.83 ml/m LA Vol (A4C):   11.9 ml 7.15 ml/m LA Biplane Vol: 16.7 ml 10.03 ml/m  AORTIC VALVE                   PULMONIC VALVE AV Area (Vmax):    2.87 cm    PV Vmax:       0.54 m/s AV Area (Vmean):   3.04 cm    PV Peak grad:  1.2 mmHg AV Area (VTI):     4.14 cm AV Vmax:           97.50 cm/s AV Vmean:          60.900 cm/s AV  VTI:            0.113 m AV Peak Grad:      3.8 mmHg AV Mean Grad:      2.0 mmHg LVOT Vmax:         89.20 cm/s LVOT Vmean:        58.900 cm/s LVOT VTI:          0.149 m LVOT/AV VTI ratio: 1.32  AORTA Ao Root diam: 3.60 cm Ao Asc diam:  4.00 cm Ao Arch diam: 2.9 cm MITRAL VALVE               TRICUSPID VALVE MV Area (PHT): 9.73 cm    TR Peak grad:   21.5 mmHg MV Decel Time: 78 msec     TR Vmax:        232.00 cm/s MV E velocity: 49.30 cm/s MV A velocity: 81.40 cm/s  SHUNTS MV E/A ratio:  0.61        Systemic VTI:  0.15 m                            Systemic Diam: 2.00 cm Ida Rogue MD Electronically signed by Ida Rogue MD Signature Date/Time: 09/14/2019/8:21:14 PM    Final     PERFORMANCE STATUS (ECOG) : 1 - Symptomatic but completely ambulatory  Review of Systems Unless otherwise noted, a complete review of systems is negative.  Physical Exam General: NAD Pulmonary: unlabored Extremities: no edema, no  joint deformities Skin: no rashes Neurological: Weakness but otherwise nonfocal  IMPRESSION: Routine follow-up visit.  Patient was seen in the infusion area.  Patient reports overall fatigue.  However he remains functionally independent and still able to drive himself to appointments.  He reports having some difficulty with meal preparation and requested assistance with enrolling in Meals on Wheels.  Will refer him to home-based palliative care and see if we can get a Education officer, museum to assist with this.  Patient denies other distressing symptoms.  Patient denied having any other changes or concerns.  PLAN: -Continue current scope of treatment -Plan to readdress MOST form at time of future clinic visit -RTC in 3 weeks   Patient expressed understanding and was in agreement with this plan. He also understands that He can call the clinic at any time with any questions, concerns, or complaints.     Time Total: 15 minutes  Visit consisted of counseling and education dealing with the  complex and emotionally intense issues of symptom management and palliative care in the setting of serious and potentially life-threatening illness.Greater than 50%  of this time was spent counseling and coordinating care related to the above assessment and plan.  Signed by: Altha Harm, PhD, NP-C

## 2019-09-27 ENCOUNTER — Encounter: Payer: Self-pay | Admitting: Radiation Oncology

## 2019-09-27 ENCOUNTER — Ambulatory Visit
Admission: RE | Admit: 2019-09-27 | Discharge: 2019-09-27 | Disposition: A | Discharge status: Home / Self Care | Payer: Medicare Other | Source: Ambulatory Visit | Attending: Radiation Oncology | Admitting: Radiation Oncology

## 2019-09-27 ENCOUNTER — Telehealth: Payer: Self-pay

## 2019-09-27 VITALS — BP 104/85 | HR 115 | Temp 94.5°F | Resp 16 | Wt 128.5 lb

## 2019-09-27 DIAGNOSIS — C3412 Malignant neoplasm of upper lobe, left bronchus or lung: Secondary | ICD-10-CM | POA: Diagnosis not present

## 2019-09-27 DIAGNOSIS — Z923 Personal history of irradiation: Secondary | ICD-10-CM | POA: Insufficient documentation

## 2019-09-27 DIAGNOSIS — C3492 Malignant neoplasm of unspecified part of left bronchus or lung: Secondary | ICD-10-CM

## 2019-09-27 DIAGNOSIS — C7951 Secondary malignant neoplasm of bone: Secondary | ICD-10-CM

## 2019-09-27 DIAGNOSIS — K769 Liver disease, unspecified: Secondary | ICD-10-CM | POA: Diagnosis not present

## 2019-09-27 DIAGNOSIS — Z9221 Personal history of antineoplastic chemotherapy: Secondary | ICD-10-CM | POA: Insufficient documentation

## 2019-09-27 NOTE — Telephone Encounter (Signed)
9:40am SW completed a referral for Meals on Wheels for patient. SW telephoned patient and advised him that referral was completed and he  will begin receiving meals next week. SW also left a message for his daughter Samuel Hood updating her on referral.

## 2019-09-27 NOTE — Progress Notes (Signed)
Radiation Oncology Follow up Note  Name: Samuel Hood.   Date:   09/27/2019 MRN:  [id number] DOB: 1950/05/08    This 69 y.o. male presents to the clinic today for follow-up.  Status post radiation therapy 1 year out for poorly differentiated carcinoma T3 lesion of the left upper lobe  REFERRING PROVIDER: Trinna Post, PA-C  HPI: Patient is a 69 year old male now out 1 year having completed definitive radiation therapy to his left upper lobe for a poorly differentiated T3 lesion.  Seen today in routine follow-up he is doing well.  He is somewhat fatigued specifically [place] cough hemoptysis or chest tightness.Marland Kitchen  He does have stage IV disease with a paraspinal soft tissue mass is benign palliative chemotherapy withcarboplatin, Taxol, bevacizumab, Tecentriq.  He had a recent CT scan back in June showing stable radiation changes involving left upper lobe with no findings for recurrent disease.  He has stable hepatic lesions.  COMPLICATIONS OF TREATMENT: none  FOLLOW UP COMPLIANCE: keeps appointments   PHYSICAL EXAM:  BP 104/85 (BP Location: Left Arm, Patient Position: Sitting)   Pulse (!) 115   Temp (!) 94.5 F (34.7 C) (Tympanic)   Resp 16   Wt 128 lb 8 oz (58.3 kg)   BMI 21.38 kg/m  Frail appearing male in NAD.  Well-developed well-nourished patient in NAD. HEENT reveals PERLA, EOMI, discs not visualized.  Oral cavity is clear. No oral mucosal lesions are identified. Neck is clear without evidence of cervical or supraclavicular adenopathy. Lungs are clear to A&P. Cardiac examination is essentially unremarkable with regular rate and rhythm without murmur rub or thrill. Abdomen is benign with no organomegaly or masses noted. Motor sensory and DTR levels are equal and symmetric in the upper and lower extremities. Cranial nerves II through XII are grossly intact. Proprioception is intact. No peripheral adenopathy or edema is identified. No motor or sensory levels are noted. Crude visual  fields are within normal range.  RADIOLOGY RESULTS: CT scans reviewed compatible with above-stated findings  PLAN: Present time patient is doing well currently under palliative chemotherapy by medical oncology.  I have asked to see him back in 1 year for follow-up.  Would be happy to reevaluate patient sooner said palliative radiation therapy be indicated.  Patient knows to call with any concerns.  I would like to take this opportunity to thank you for allowing me to participate in the care of your patient.Noreene Filbert, MD

## 2019-10-04 ENCOUNTER — Inpatient Hospital Stay: Payer: Medicare Other

## 2019-10-04 NOTE — Progress Notes (Signed)
Nutrition Follow-up:  Patient with stage IV adenocarcinoma of lung with mets to liver, bone and chest wall as well as stage IV HCC.  Patient receiving chemotherapy. Followed by Palliative care.  Spoke with patient via phone.  Patient reports that he is eating all he can.  Reports feeling tired all the time. Denies nausea and reports regular bowel movement.  Having a hard time with neuropathy, although some improvement as says today that he can write better.  Drinking oral nutrition supplements about 3 per day.  Yesterday was able to eat cereal, eat most of [employer] Whe[employer] meal for lunch (chicken, rice and greens). Supper last night was piece of fish and slice of bread and sweet cake.  Drinking water, powerade, tea.      Medications: megace  Labs: reviewed  Anthropometrics:   Weight 128 lb 8 oz on 8/12 decreased from 136 lb on 6/30   NUTRITION DIAGNOSIS: Inadequate oral intake continues with weight loss    INTERVENTION:  Encouraged high calorie, high protein foods to prevent further weight loss Continue oral nutrition supplements. Denied needing any today.  Patient has contact information    MONITORING, EVALUATION, GOAL: weight trends, intake   NEXT VISIT: Sept 16, phone f/u  Aziel Morgan B. [name], [place], [place] Registered Dietitian [phone] (mobile)

## 2019-10-05 ENCOUNTER — Telehealth: Payer: Self-pay | Admitting: Nurse Practitioner

## 2019-10-05 NOTE — Telephone Encounter (Signed)
Spoke with patient regarding Palliative services and he has declined to schedule a visit.  Patient stated that he was doing pretty good and didn't feel he needed our services at this time.  Notified [facility]

## 2019-10-17 ENCOUNTER — Inpatient Hospital Stay: Payer: Medicare Other | Attending: Oncology

## 2019-10-17 ENCOUNTER — Telehealth: Payer: Self-pay

## 2019-10-17 ENCOUNTER — Encounter: Payer: Self-pay | Admitting: Oncology

## 2019-10-17 ENCOUNTER — Inpatient Hospital Stay: Payer: Medicare Other

## 2019-10-17 ENCOUNTER — Inpatient Hospital Stay (HOSPITAL_BASED_OUTPATIENT_CLINIC_OR_DEPARTMENT_OTHER): Payer: Medicare Other | Admitting: Oncology

## 2019-10-17 ENCOUNTER — Inpatient Hospital Stay (HOSPITAL_BASED_OUTPATIENT_CLINIC_OR_DEPARTMENT_OTHER): Payer: Medicare Other | Admitting: Hospice and Palliative Medicine

## 2019-10-17 ENCOUNTER — Other Ambulatory Visit: Payer: Self-pay

## 2019-10-17 VITALS — BP 121/94 | HR 102 | Temp 96.3°F | Resp 18 | Wt 126.2 lb

## 2019-10-17 VITALS — BP 110/86 | HR 85 | Resp 20

## 2019-10-17 DIAGNOSIS — R55 Syncope and collapse: Secondary | ICD-10-CM | POA: Insufficient documentation

## 2019-10-17 DIAGNOSIS — R000 Tachycardia, unspecified: Secondary | ICD-10-CM | POA: Insufficient documentation

## 2019-10-17 DIAGNOSIS — C7802 Secondary malignant neoplasm of left lung: Secondary | ICD-10-CM | POA: Diagnosis not present

## 2019-10-17 DIAGNOSIS — M25512 Pain in left shoulder: Secondary | ICD-10-CM | POA: Diagnosis not present

## 2019-10-17 DIAGNOSIS — I700 Atherosclerosis of aorta: Secondary | ICD-10-CM | POA: Diagnosis not present

## 2019-10-17 DIAGNOSIS — C3412 Malignant neoplasm of upper lobe, left bronchus or lung: Secondary | ICD-10-CM | POA: Insufficient documentation

## 2019-10-17 DIAGNOSIS — Z515 Encounter for palliative care: Secondary | ICD-10-CM

## 2019-10-17 DIAGNOSIS — T451X5A Adverse effect of antineoplastic and immunosuppressive drugs, initial encounter: Secondary | ICD-10-CM | POA: Diagnosis not present

## 2019-10-17 DIAGNOSIS — G620 Drug-induced polyneuropathy: Secondary | ICD-10-CM | POA: Insufficient documentation

## 2019-10-17 DIAGNOSIS — C3492 Malignant neoplasm of unspecified part of left bronchus or lung: Secondary | ICD-10-CM

## 2019-10-17 DIAGNOSIS — R634 Abnormal weight loss: Secondary | ICD-10-CM | POA: Insufficient documentation

## 2019-10-17 DIAGNOSIS — Z809 Family history of malignant neoplasm, unspecified: Secondary | ICD-10-CM | POA: Insufficient documentation

## 2019-10-17 DIAGNOSIS — C220 Liver cell carcinoma: Secondary | ICD-10-CM

## 2019-10-17 DIAGNOSIS — I959 Hypotension, unspecified: Secondary | ICD-10-CM | POA: Insufficient documentation

## 2019-10-17 DIAGNOSIS — Z7289 Other problems related to lifestyle: Secondary | ICD-10-CM | POA: Insufficient documentation

## 2019-10-17 DIAGNOSIS — Z5111 Encounter for antineoplastic chemotherapy: Secondary | ICD-10-CM | POA: Diagnosis not present

## 2019-10-17 DIAGNOSIS — G893 Neoplasm related pain (acute) (chronic): Secondary | ICD-10-CM | POA: Diagnosis not present

## 2019-10-17 DIAGNOSIS — Z923 Personal history of irradiation: Secondary | ICD-10-CM | POA: Diagnosis not present

## 2019-10-17 DIAGNOSIS — K8020 Calculus of gallbladder without cholecystitis without obstruction: Secondary | ICD-10-CM | POA: Insufficient documentation

## 2019-10-17 DIAGNOSIS — M4317 Spondylolisthesis, lumbosacral region: Secondary | ICD-10-CM | POA: Diagnosis not present

## 2019-10-17 DIAGNOSIS — C7951 Secondary malignant neoplasm of bone: Secondary | ICD-10-CM | POA: Diagnosis not present

## 2019-10-17 DIAGNOSIS — C787 Secondary malignant neoplasm of liver and intrahepatic bile duct: Secondary | ICD-10-CM | POA: Insufficient documentation

## 2019-10-17 DIAGNOSIS — E876 Hypokalemia: Secondary | ICD-10-CM

## 2019-10-17 DIAGNOSIS — I712 Thoracic aortic aneurysm, without rupture: Secondary | ICD-10-CM | POA: Insufficient documentation

## 2019-10-17 DIAGNOSIS — Z5112 Encounter for antineoplastic immunotherapy: Secondary | ICD-10-CM

## 2019-10-17 DIAGNOSIS — E871 Hypo-osmolality and hyponatremia: Secondary | ICD-10-CM

## 2019-10-17 DIAGNOSIS — B1920 Unspecified viral hepatitis C without hepatic coma: Secondary | ICD-10-CM | POA: Diagnosis not present

## 2019-10-17 DIAGNOSIS — M47816 Spondylosis without myelopathy or radiculopathy, lumbar region: Secondary | ICD-10-CM | POA: Insufficient documentation

## 2019-10-17 DIAGNOSIS — J948 Other specified pleural conditions: Secondary | ICD-10-CM | POA: Diagnosis not present

## 2019-10-17 DIAGNOSIS — Z79899 Other long term (current) drug therapy: Secondary | ICD-10-CM | POA: Diagnosis not present

## 2019-10-17 DIAGNOSIS — J449 Chronic obstructive pulmonary disease, unspecified: Secondary | ICD-10-CM | POA: Insufficient documentation

## 2019-10-17 LAB — COMPREHENSIVE METABOLIC PANEL
ALT: 43 U/L (ref 0–44)
AST: 48 U/L — ABNORMAL HIGH (ref 15–41)
Albumin: 3.2 g/dL — ABNORMAL LOW (ref 3.5–5.0)
Alkaline Phosphatase: 63 U/L (ref 38–126)
Anion gap: 10 (ref 5–15)
BUN: 14 mg/dL (ref 8–23)
CO2: 29 mmol/L (ref 22–32)
Calcium: 8.5 mg/dL — ABNORMAL LOW (ref 8.9–10.3)
Chloride: 90 mmol/L — ABNORMAL LOW (ref 98–111)
Creatinine, Ser: 0.75 mg/dL (ref 0.61–1.24)
GFR calc Af Amer: >60 mL/min (ref >60)
GFR calc non Af Amer: >60 mL/min (ref >60)
Glucose, Bld: 132 mg/dL — ABNORMAL HIGH (ref 70–99)
Potassium: 3 mmol/L — ABNORMAL LOW (ref 3.5–5.1)
Sodium: 129 mmol/L — ABNORMAL LOW (ref 135–145)
Total Bilirubin: 1 mg/dL (ref 0.3–1.2)
Total Protein: 7.3 g/dL (ref 6.5–8.1)

## 2019-10-17 LAB — CBC WITH DIFFERENTIAL/PLATELET
Abs Immature Granulocytes: 0.04 10*3/uL (ref 0.00–0.07)
Basophils Absolute: 0 10*3/uL (ref 0.0–0.1)
Basophils Relative: 0 %
Eosinophils Absolute: 0 10*3/uL (ref 0.0–0.5)
Eosinophils Relative: 1 %
HCT: 40.5 % (ref 39.0–52.0)
Hemoglobin: 14.2 g/dL (ref 13.0–17.0)
Immature Granulocytes: 1 %
Lymphocytes Relative: 13 %
Lymphs Abs: 1.1 10*3/uL (ref 0.7–4.0)
MCH: 29.5 pg (ref 26.0–34.0)
MCHC: 35.1 g/dL (ref 30.0–36.0)
MCV: 84 fL (ref 80.0–100.0)
Monocytes Absolute: 1 10*3/uL (ref 0.1–1.0)
Monocytes Relative: 11 %
Neutro Abs: 6.4 10*3/uL (ref 1.7–7.7)
Neutrophils Relative %: 74 %
Platelets: 293 10*3/uL (ref 150–400)
RBC: 4.82 MIL/uL (ref 4.22–5.81)
RDW: 17.2 % — ABNORMAL HIGH (ref 11.5–15.5)
WBC: 8.5 10*3/uL (ref 4.0–10.5)
nRBC: 0 % (ref 0.0–0.2)

## 2019-10-17 LAB — MAGNESIUM: Magnesium: 1.9 mg/dL (ref 1.7–2.4)

## 2019-10-17 LAB — PROTEIN, URINE, RANDOM: Total Protein, Urine: 16 mg/dL

## 2019-10-17 MED ORDER — SODIUM CHLORIDE 0.9 % IV SOLN
Freq: Once | INTRAVENOUS | Status: AC
  Filled 2019-10-17: qty 1000

## 2019-10-17 MED ORDER — HEPARIN SOD (PORK) LOCK FLUSH 100 UNIT/ML IV SOLN
500.0000 [IU] | Freq: Once | INTRAVENOUS | Status: AC
  Administered 2019-10-17: 500 [IU] via INTRAVENOUS
  Filled 2019-10-17: qty 5

## 2019-10-17 MED ORDER — MEGESTROL ACETATE 40 MG/ML PO SUSP: 400.0000 mg | Freq: Every day | ORAL | 0 refills | Status: DC

## 2019-10-17 MED ORDER — POTASSIUM CHLORIDE 20 MEQ/100ML IV SOLN: 20.0000 meq | Freq: Once | INTRAVENOUS | Status: DC

## 2019-10-17 MED ORDER — OXYCODONE HCL 5 MG PO TABS
5.0000 mg | ORAL_TABLET | Freq: Three times a day (TID) | ORAL | 0 refills | Status: DC | PRN
Start: 2019-10-17 — End: 2019-10-17

## 2019-10-17 MED ORDER — SODIUM CHLORIDE 0.9% FLUSH
10.0000 mL | INTRAVENOUS | Status: DC | PRN
  Administered 2019-10-17 (×2): 10 mL via INTRAVENOUS
  Filled 2019-10-17: qty 10

## 2019-10-17 MED ORDER — MEGESTROL ACETATE 400 MG/10ML PO SUSP: 400.0000 mg | Freq: Every day | ORAL | 1 refills | Status: DC

## 2019-10-17 MED ORDER — OXYCODONE HCL 5 MG PO TABS
5.0000 mg | ORAL_TABLET | Freq: Three times a day (TID) | ORAL | 0 refills | Status: DC | PRN
Start: 2019-10-17 — End: 2020-01-04

## 2019-10-17 MED ORDER — HEPARIN SOD (PORK) LOCK FLUSH 100 UNIT/ML IV SOLN
INTRAVENOUS | Status: AC
  Filled 2019-10-17: qty 5

## 2019-10-17 MED ORDER — SODIUM CHLORIDE 0.9 % IV SOLN
INTRAVENOUS | Status: DC
  Filled 2019-10-17: qty 250

## 2019-10-17 NOTE — Progress Notes (Signed)
Hematology/Oncology follow up note [facility] [phone] [phone]  Reassurance Patient Care Team: Gilbert, Richard L Jr., MD as PCP - General (Family Medicine) Agbor-Etang, Brian, MD as PCP - Cardiology (Cardiology) Rhode, Hayley, RN as Registered Nurse Stanton, Kristi D, RN as Registered Nurse  REFERRING PROVIDER: Gilbert, Richard L Jr.,*  CHIEF COMPLAINTS/REASON FOR VISIT:  metastatic cancer management.  HISTORY OF PRESENTING ILLNESS:   Samuel D Shark Jr. is a  69 y.o.  male with PMH listed below was seen in consultation at the request of  Gilbert, Richard L Jr.,*  for evaluation of lung nodule in the liver lesion. Patient has a past medical history of hypertension, alcohol abuse, smoking emphysema.  He has had serial CTs done in the past.  CT images were independently reviewed by me. 07/26/2017 CT chest with contrast showed extensive pleural-parenchymal scarring within the left upper lobe with several areas of soft tissue nodularity measuring up to 1.6 cm.  In this patient who is at increased risk of lung cancer further investigation with PET scan is recommended. Small chronic appearance loculated hydropneumothorax overlies the left apex. Slowly enlarging lesion within the central portion of the right lobe of liver identified. Per note, patient supposed to have additional work-up done in December repeat CT scan which he did not  Patient was recently seen by primary care provider and has subsequent image done for follow-up. CT on 06/30/2018 showed multiple significantly enlarged paraspinal mass are noted concerning.  The largest measures 3.8 x 0.5 cm and there appears to be a lytic destruction of the adjacent T11 vertebral body.  Also noted is new solid density measuring 17 x 12 mm in the pleural parenchymal density in the left upper lobe noted on prior exam.  Concerning for malignancy.  4.4 ascending thoracic aortic aneurysm.  Recommend annual  imaging.  Patient had PET scan done on 07/06/2018 Which showed there are 2 FDG avid lesion within the left upper lobe worrisome for primary lung neoplasm.  There is evidence of chest wall involvement.  Metastasis to the posterior mediastinum is identified with extension into T11 vertebra and possible involvement of the left T12 neural foramina.  Patient reports left shoulder pain.  Smoking half a pack a day not motivated with further questioning quitting Denies weight loss, hemoptysis, cough.  Chronic shortness of breath with exertion. He drinks alcohol daily. Lives with his girlfriend.  He has 5 adult kids  #Hepatitis C  # 6/1?2020  CT-guided biopsy of left-sided posterior mediastinal soft tissue adjacent to T10/11. Patient's case was discussed on tumor board. Consensus was reached that HCC versus primary lung cancer with hepatoid adenocarcinoma features. Consensus was to proceed with liver biopsy first as liver mass was not FDG avid on PET scan.  Patient underwent liver biopsy and the present to discuss pathology reports.  ## 08/08/2018 Liver mass biopsy showed fragments of necrotic and calcified tissue with adjacent fibrous capsule Scant viable nonneoplastic liver tissue with steatosis, inflammation and non specific fibrosis.  # 08/28/2018 CT guided left upper lobe lung mass biopsy showed poorly differentiated adenocarcinoma of lung origin.  6/19-08/16/2018  Finished palliative radiation to paraspinal soft tissue mass. 09/07/2018 patient was started on lenvatinib 12 mg daily. 10/13/2018 SBRT to lung cancer lesion.  #Patient is on Zometa monthly #He was advised to take calcium supplement.  He reports he quit taking calcium as he broke out rash on her scalp, neck and chest after taking calcium. # 03/30/19 liver lesion biopsy showed poorly   differentiated adenocarcinoma, compatible with lung primary.  INTERVAL HISTORY Samuel D Shadden Jr. is a 69 y.o. male who has above history reviewed by me today  presents for evaluation prior to chemotherapy for lung cancer and HCC. Patient reports that he feels very tired and fatigued. His appetite has decreased.  Weight has decreased 2 pounds since 3 weeks ago. He also has had occasionally left chest wall pain.  Currently no pain. He takes fentanyl patch and oxycodone and gabapentin for bilateral lower extremity neuropathy pain.  Review of Systems  Constitutional: Positive for fatigue and unexpected weight change. Negative for appetite change, chills and fever.  HENT:   Negative for hearing loss and voice change.   Eyes: Negative for eye problems and icterus.  Respiratory: Negative for chest tightness, cough and shortness of breath.   Cardiovascular: Negative for chest pain and leg swelling.  Gastrointestinal: Negative for abdominal distention, abdominal pain and constipation.  Endocrine: Negative for hot flashes.  Genitourinary: Negative for difficulty urinating, dysuria and frequency.   Musculoskeletal: Negative for arthralgias.  Skin: Negative for itching and rash.  Neurological: Positive for numbness. Negative for light-headedness.  Hematological: Negative for adenopathy. Does not bruise/bleed easily.  Psychiatric/Behavioral: Negative for confusion.    MEDICAL HISTORY:  Past Medical History:  Diagnosis Date  . Hepatocellular carcinoma metastatic to left lung (HCC) 07/24/2018  . History of angiography    left lower extremity  . Hypertension   . Small bowel obstruction (HCC)     SURGICAL HISTORY: Past Surgical History:  Procedure Laterality Date  . COLON SURGERY    . COLONOSCOPY W/ POLYPECTOMY    . COLONOSCOPY WITH PROPOFOL N/A 01/31/2018   Procedure: COLONOSCOPY WITH PROPOFOL;  Surgeon: Toledo, Teodoro K, MD;  Location: [facility] ENDOSCOPY;  Service: Gastroenterology;  Laterality: N/A;  . debridement fasciotomy leg, left Left 03/19/2016  . LAPAROTOMY N/A 09/27/2014   Procedure: EXPLORATORY LAPAROTOMY;  Surgeon: Ralph Ely III, MD;   Location: [facility] ORS;  Service: General;  Laterality: N/A;  . PORTA CATH INSERTION N/A 04/10/2019   Procedure: PORTA CATH INSERTION;  Surgeon: Schnier, Gregory G, MD;  Location: [facility] INVASIVE CV LAB;  Service: Cardiovascular;  Laterality: N/A;    SOCIAL HISTORY: Social History   Socioeconomic History  . Marital status: Single    Spouse name: Not on file  . Number of children: Not on file  . Years of education: Not on file  . Highest education level: Not on file  Occupational History  . Occupation: retired  Tobacco Use  . Smoking status: Current Every Day Smoker    Packs/day: 0.25    Years: 50.00    Pack years: 12.50  . Smokeless tobacco: Never Used  . Tobacco comment: 3 cigarettes per day  Vaping Use  . Vaping Use: Never used  Substance and Sexual Activity  . Alcohol use: Not Currently    Alcohol/week: 35.0 standard drinks    Types: 30 Cans of beer, 5 Shots of liquor per week  . Drug use: No  . Sexual activity: Not on file  Other Topics Concern  . Not on file  Social History Narrative  . Not on file   Social Determinants of Health   Financial Resource Strain:   . Difficulty of Paying Living Expenses: Not on file  Food Insecurity:   . Worried About Running Out of Food in the Last Year: Not on file  . Ran Out of Food in the Last Year: Not on file  Transportation Needs:   .   Lack of Transportation (Medical): Not on file  . Lack of Transportation (Non-Medical): Not on file  Physical Activity:   . Days of Exercise per Week: Not on file  . Minutes of Exercise per Session: Not on file  Stress:   . Feeling of Stress : Not on file  Social Connections:   . Frequency of Communication with Friends and Family: Not on file  . Frequency of Social Gatherings with Friends and Family: Not on file  . Attends Religious Services: Not on file  . Active Member of Clubs or Organizations: Not on file  . Attends Archivist Meetings: Not on file  . Marital Status: Not on file    Intimate Partner Violence:   . Fear of Current or Ex-Partner: Not on file  . Emotionally Abused: Not on file  . Physically Abused: Not on file  . Sexually Abused: Not on file    FAMILY HISTORY: Family History  Problem Relation Age of Onset  . Cancer Mother   . Cancer Father     ALLERGIES:  is allergic to 5-alpha reductase inhibitors.  MEDICATIONS:  Current Outpatient Medications  Medication Sig Dispense Refill  . dexamethasone (DECADRON) 4 MG tablet Take 2 tablets (8 mg total) by mouth daily. Start the day after carboplatin chemotherapy for 3 days. 16 tablet 1  . fentaNYL (DURAGESIC) 25 MCG/HR Place 1 patch onto the skin every 3 (three) days. 10 patch 0  . gabapentin (NEURONTIN) 300 MG capsule Take 1 capsule (300 mg total) by mouth 4 (four) times daily. 120 capsule 0  . hydrochlorothiazide (HYDRODIURIL) 25 MG tablet Take 1 tablet (25 mg total) by mouth daily. 30 tablet 12  . lidocaine-prilocaine (EMLA) cream Apply to affected area once (Patient taking differently: Apply 1 application topically daily as needed (port acess). ) 30 g 3  . lidocaine-prilocaine (EMLA) cream Apply 1 application topically as needed.    . loperamide (IMODIUM) 2 MG capsule Take 1 capsule (2 mg total) by mouth See admin instructions. With onset of loose stool, take 65m followed by 27mevery 2 hours until 12 hours have passed without loose bowel movement. Maximum: 16 mg/day 60 capsule 1  . magnesium chloride (SLOW-MAG) 64 MG TBEC SR tablet Take 1 tablet (64 mg total) by mouth daily. 30 tablet 0  . megestrol (MEGACE) 40 MG tablet Take 1 tablet (40 mg total) by mouth 2 (two) times daily. 60 tablet 0  . Multiple Vitamin (MULTIVITAMIN) capsule Take 1 capsule by mouth daily.    . Marland KitchenARCAN 4 MG/0.1ML LIQD nasal spray kit 1 spray once.  (Patient not taking: Reported on 09/25/2019)    . ondansetron (ZOFRAN) 8 MG tablet Take 1 tablet (8 mg total) by mouth 2 (two) times daily as needed for refractory nausea / vomiting. Start  on day 3 after carboplatin chemo. 30 tablet 1  . oxyCODONE (ROXICODONE) 5 MG immediate release tablet Take 1 tablet (5 mg total) by mouth every 8 (eight) hours as needed for severe pain or breakthrough pain. 60 tablet 0  . polyethylene glycol (MIRALAX / GLYCOLAX) 17 g packet Take 17 g by mouth daily. 14 each 0  . potassium chloride (KLOR-CON) 10 MEQ tablet Take 1 tablet (10 mEq total) by mouth daily. 60 tablet 0  . prochlorperazine (COMPAZINE) 10 MG tablet Take 1 tablet (10 mg total) by mouth every 6 (six) hours as needed (Nausea or vomiting). 30 tablet 1  . senna (SENOKOT) 8.6 MG TABS tablet Take 1 tablet (8.6 mg  total) by mouth daily. 120 tablet 0   No current facility-administered medications for this visit.   Facility-Administered Medications Ordered in Other Visits  Medication Dose Route Frequency Provider Last Rate Last Admin  . heparin lock flush 100 unit/mL  500 Units Intravenous Once Earlie Server, MD      . sodium chloride flush (NS) 0.9 % injection 10 mL  10 mL Intravenous PRN Earlie Server, MD         PHYSICAL EXAMINATION: ECOG PERFORMANCE STATUS: 1 - Symptomatic but completely ambulatory Vitals:   10/17/19 0852  BP: (!) 121/94  Pulse: (!) 102  Resp: 18  Temp: (!) 96.3 F (35.7 C)  SpO2: 99%   Filed Weights   10/17/19 0852  Weight: 126 lb 3.2 oz (57.2 kg)    Physical Exam Constitutional:      General: He is not in acute distress.    Appearance: He is not ill-appearing.  HENT:     Head: Normocephalic and atraumatic.  Eyes:     General: No scleral icterus. Cardiovascular:     Rate and Rhythm: Regular rhythm. Tachycardia present.     Heart sounds: Normal heart sounds.  Pulmonary:     Effort: Pulmonary effort is normal. No respiratory distress.     Breath sounds: No wheezing or rales.  Abdominal:     General: Bowel sounds are normal. There is no distension.     Palpations: Abdomen is soft.  Musculoskeletal:        General: No deformity. Normal range of motion.      Cervical back: Normal range of motion and neck supple.  Skin:    General: Skin is warm and dry.     Findings: No erythema or rash.  Neurological:     Mental Status: He is alert and oriented to person, place, and time. Mental status is at baseline.     Cranial Nerves: No cranial nerve deficit.     Coordination: Coordination normal.  Psychiatric:        Mood and Affect: Mood normal.     LABORATORY DATA:  I have reviewed the data as listed Lab Results  Component Value Date   WBC 5.1 09/26/2019   HGB 13.8 09/26/2019   HCT 40.8 09/26/2019   MCV 87.7 09/26/2019   PLT 257 09/26/2019   Recent Labs    09/05/19 0812 09/12/19 1006 09/26/19 0807  NA 141 138 139  K 3.4* 3.7 3.3*  CL 107 105 100  CO2 _0 GLUCOSE 132* 87 140*  BUN _1 CREATININE 0.61 0.63 0.77  CALCIUM 8.9 8.5* 9.3  GFRNONAA >60 >60 >60  GFRAA >60 >60 >60  PROT 6.8 6.0* 7.0  ALBUMIN 3.3* 3.1* 3.4*  AST 57* 49* 58*  ALT 45* 54* 50*  ALKPHOS 72 63 78  BILITOT 0.5 0.5 0.4   Iron/TIBC/Ferritin/ %Sat No results found for: IRON, TIBC, FERRITIN, IRONPCTSAT   RADIOGRAPHIC STUDIES: I have personally reviewed the radiological images as listed and agreed with the findings in the report. CT ANGIO CHEST PE W OR WO CONTRAST  Result Date: 07/24/2019 CLINICAL DATA:  Chest pain and shortness of breath. History of stage IV adenocarcinoma of the left lung. EXAM: CT ANGIOGRAPHY CHEST WITH CONTRAST TECHNIQUE: Multidetector CT imaging of the chest was performed using the standard protocol during bolus administration of intravenous contrast. Multiplanar CT image reconstructions and MIPs were obtained to evaluate the vascular anatomy. CONTRAST:  26m OMNIPAQUE IOHEXOL 350 MG/ML SOLN COMPARISON:  Chest CT 07/04/2019 and CT angio chest 03/26/2019 FINDINGS: Cardiovascular: The heart is normal in size. No pericardial effusion. Stable mild fusiform aneurysmal dilatation of the ascending thoracic aorta with maximum measurement of  4.1 cm. No dissection. Stable atherosclerotic calcifications at the aortic arch. Stable scattered coronary artery calcifications. The pulmonary arterial tree is well opacified. No filling defects to suggest pulmonary embolism. Mediastinum/Nodes: No mediastinal or hilar mass or adenopathy. Small scattered lymph nodes are stable. The esophagus is grossly normal. Lungs/Pleura: Stable advanced emphysematous changes and pulmonary scarring. Stable radiation changes involving the left upper lobe no findings for recurrent tumor. No new pulmonary nodules. No pleural effusions or pleural nodules. Upper Abdomen: Stable central right hepatic lobe lesion which measures 2.9 x 2.6 cm. Stable 13 mm lesion in segment 6 near the right kidney. No new hepatic lesions. No adrenal gland lesions. Musculoskeletal: No significant bony findings. There is a stable lucent lesion in the T11 vertebral body with smooth corticated margins. No new bone lesions. Review of the MIP images confirms the above findings. IMPRESSION: 1. No CT findings for pulmonary embolism. 2. Stable mild fusiform aneurysmal dilatation of the ascending thoracic aorta with maximum measurement of 4.1 cm. No dissection. Recommend continued observation. 3. Stable radiation changes involving the left upper lobe. No findings for recurrent tumor. 4. Stable advanced emphysematous changes and pulmonary scarring. No new pulmonary nodules. 5. Stable hepatic lesions.  No new or progressive findings. 6. Emphysema and aortic atherosclerosis. Aortic Atherosclerosis (ICD10-I70.0) and Emphysema (ICD10-J43.9). Electronically Signed   By: P.  Gallerani M.D.   On: 07/24/2019 15:45   US Venous Img Lower Bilateral (DVT)  Result Date: 08/02/2019 CLINICAL DATA:  Bilateral lower extremity pain and edema for the past 2 weeks. History of smoking. History of lung cancer. Evaluate for DVT. EXAM: BILATERAL LOWER EXTREMITY VENOUS DOPPLER ULTRASOUND TECHNIQUE: Gray-scale sonography with graded  compression, as well as color Doppler and duplex ultrasound were performed to evaluate the lower extremity deep venous systems from the level of the common femoral vein and including the common femoral, femoral, profunda femoral, popliteal and calf veins including the posterior tibial, peroneal and gastrocnemius veins when visible. The superficial great saphenous vein was also interrogated. Spectral Doppler was utilized to evaluate flow at rest and with distal augmentation maneuvers in the common femoral, femoral and popliteal veins. COMPARISON:  None. FINDINGS: RIGHT LOWER EXTREMITY Common Femoral Vein: No evidence of thrombus. Normal compressibility, respiratory phasicity and response to augmentation. Saphenofemoral Junction: No evidence of thrombus. Normal compressibility and flow on color Doppler imaging. Profunda Femoral Vein: No evidence of thrombus. Normal compressibility and flow on color Doppler imaging. Femoral Vein: No evidence of thrombus. Normal compressibility, respiratory phasicity and response to augmentation. Popliteal Vein: No evidence of thrombus. Normal compressibility, respiratory phasicity and response to augmentation. Calf Veins: No evidence of thrombus. Normal compressibility and flow on color Doppler imaging. Superficial Great Saphenous Vein: No evidence of thrombus. Normal compressibility. Venous Reflux:  None. Other Findings:  None. LEFT LOWER EXTREMITY Common Femoral Vein: No evidence of thrombus. Normal compressibility, respiratory phasicity and response to augmentation. Saphenofemoral Junction: No evidence of thrombus. Normal compressibility and flow on color Doppler imaging. Profunda Femoral Vein: No evidence of thrombus. Normal compressibility and flow on color Doppler imaging. Femoral Vein: No evidence of thrombus. Normal compressibility, respiratory phasicity and response to augmentation. Popliteal Vein: No evidence of thrombus. Normal compressibility, respiratory phasicity and  response to augmentation. Calf Veins: No evidence of thrombus. Normal compressibility and flow on color Doppler imaging.   Superficial Great Saphenous Vein: No evidence of thrombus. Normal compressibility. Venous Reflux:  None. Other Findings:  None. IMPRESSION: No evidence of DVT within either lower extremity. Electronically Signed   By: Sandi Mariscal M.D.   On: 08/02/2019 10:36   ECHOCARDIOGRAM COMPLETE  Result Date: 09/14/2019    ECHOCARDIOGRAM REPORT   Patient Name:   Samuel Hood. Date of Exam: 09/14/2019 Medical Rec #:  [id number]        Height:       65.0 in Accession #:    [id number]       Weight:       133.2 lb Date of Birth:  07-22-1950         BSA:          1.664 m Patient Age:    63 years         BP:           132/90 mmHg Patient Gender: M                HR:           103 bpm. Exam Location:  [place] Procedure: 2D Echo, Cardiac Doppler and Color Doppler Indications:    R06.02 SOB  History:        Patient has no prior history of Echocardiogram examinations.                 Thoracic aortic aneurysm, COPD, Arrythmias:Tachycardia,                 Signs/Symptoms:Shortness of Breath; Risk Factors:Former Smoker                 and Hypertension.  Sonographer:    Pilar Jarvis RDMS, RVT, RDCS Referring Phys: [id number] BRIAN AGBOR-ETANG IMPRESSIONS  1. Left ventricular ejection fraction, by estimation, is 55%. The left ventricle has normal function. The left ventricle has no regional wall motion abnormalities. There is mild left ventricular hypertrophy. Left ventricular diastolic parameters are consistent with Grade I diastolic dysfunction (impaired relaxation).  2. Right ventricular systolic function is normal. The right ventricular size is normal. There is normal pulmonary artery systolic pressure. The estimated right ventricular systolic pressure is 31.5 mmHg.  3. Tricuspid valve regurgitation is mild to moderate.  4. There is dilatation of the aortic root measuring 36 mm. Ascending aorta 4.0 cm FINDINGS  Left  Ventricle: Left ventricular ejection fraction, by estimation, is 55 to 60%. The left ventricle has normal function. The left ventricle has no regional wall motion abnormalities. The left ventricular internal cavity size was normal in size. There is  mild left ventricular hypertrophy. Left ventricular diastolic parameters are consistent with Grade I diastolic dysfunction (impaired relaxation). Right Ventricle: The right ventricular size is normal. No increase in right ventricular wall thickness. Right ventricular systolic function is normal. There is normal pulmonary artery systolic pressure. The tricuspid regurgitant velocity is 2.32 m/s, and  with an assumed right atrial pressure of 10 mmHg, the estimated right ventricular systolic pressure is 40.0 mmHg. Left Atrium: Left atrial size was normal in size. Right Atrium: Right atrial size was normal in size. Pericardium: There is no evidence of pericardial effusion. Mitral Valve: The mitral valve is normal in structure. Normal mobility of the mitral valve leaflets. No evidence of mitral valve regurgitation. No evidence of mitral valve stenosis. Tricuspid Valve: The tricuspid valve is normal in structure. Tricuspid valve regurgitation is mild to moderate. No evidence of tricuspid stenosis. Aortic Valve: The aortic valve  is normal in structure. Aortic valve regurgitation is not visualized. Mild to moderate aortic valve sclerosis/calcification is present, without any evidence of aortic stenosis. Aortic valve mean gradient measures 2.0 mmHg. Aortic valve peak gradient measures 3.8 mmHg. Aortic valve area, by VTI measures 4.14 cm. Pulmonic Valve: The pulmonic valve was normal in structure. Pulmonic valve regurgitation is not visualized. No evidence of pulmonic stenosis. Aorta: The aortic root is normal in size and structure. There is dilatation of the aortic root measuring 36 mm. Venous: The inferior vena cava is normal in size with greater than 50% respiratory  variability, suggesting right atrial pressure of 3 mmHg. IAS/Shunts: No atrial level shunt detected by color flow Doppler.  LEFT VENTRICLE PLAX 2D LVIDd:         2.90 cm     Diastology LVIDs:         2.00 cm     LV e' lateral:   11.10 cm/s LV PW:         0.80 cm     LV E/e' lateral: 4.4 LV IVS:        0.80 cm     LV e' medial:    6.74 cm/s LVOT diam:     2.00 cm     LV E/e' medial:  7.3 LV SV:         47 LV SV Index:   28 LVOT Area:     3.14 cm  LV Volumes (MOD) LV vol d, MOD A2C: 37.6 ml LV vol d, MOD A4C: 34.6 ml LV vol s, MOD A2C: 20.7 ml LV vol s, MOD A4C: 19.0 ml LV SV MOD A2C:     16.9 ml LV SV MOD A4C:     34.6 ml LV SV MOD BP:      17.9 ml RIGHT VENTRICLE            IVC RV Basal diam:  2.50 cm    IVC diam: 0.60 cm RV S prime:     7.51 cm/s TAPSE (M-mode): 1.2 cm LEFT ATRIUM             Index       RIGHT ATRIUM          Index LA diam:        2.60 cm 1.56 cm/m  RA Area:     7.80 cm LA Vol (A2C):   19.4 ml 11.66 ml/m RA Volume:   14.70 ml 8.83 ml/m LA Vol (A4C):   11.9 ml 7.15 ml/m LA Biplane Vol: 16.7 ml 10.03 ml/m  AORTIC VALVE                   PULMONIC VALVE AV Area (Vmax):    2.87 cm    PV Vmax:       0.54 m/s AV Area (Vmean):   3.04 cm    PV Peak grad:  1.2 mmHg AV Area (VTI):     4.14 cm AV Vmax:           97.50 cm/s AV Vmean:          60.900 cm/s AV VTI:            0.113 m AV Peak Grad:      3.8 mmHg AV Mean Grad:      2.0 mmHg LVOT Vmax:         89.20 cm/s LVOT Vmean:        58.900 cm/s LVOT VTI:            0.149 m LVOT/AV VTI ratio: 1.32  AORTA Ao Root diam: 3.60 cm Ao Asc diam:  4.00 cm Ao Arch diam: 2.9 cm MITRAL VALVE               TRICUSPID VALVE MV Area (PHT): 9.73 cm    TR Peak grad:   21.5 mmHg MV Decel Time: 78 msec     TR Vmax:        232.00 cm/s MV E velocity: 49.30 cm/s MV A velocity: 81.40 cm/s  SHUNTS MV E/A ratio:  0.61        Systemic VTI:  0.15 m                            Systemic Diam: 2.00 cm Ida Rogue MD Electronically signed by Ida Rogue MD Signature Date/Time:  09/14/2019/8:21:14 PM    Final       ASSESSMENT & PLAN:  1. Stage IV adenocarcinoma of lung, left ([place])   2. Hepatocellular carcinoma metastatic to left lung ([place])   3. Encounter for antineoplastic chemotherapy   4. Neoplasm related pain   5. Hypomagnesemia   6. Hyponatremia   7. Hypokalemia    #Stage IV Lung adenocarcinoma, Stage IV HCC-07/17/2018 paraspinal soft tissue biopsy Patient is on palliative chemotherapy with carboplatin, Taxol, bevacizumab, Tecentriq. Labs are reviewed and discussed with patient. Hold treatment today given patient's low electrolytes and progressive symptoms. I will check a CT chest abdomen pelvis for evaluation of cancer status.  #Chronic tachycardia, follow-up with cardiology. #Hyponatremia, likely due to decreased appetite and poor oral intake.  Patient will receive IV normal saline x1 today. #Weight loss, continue follow-up with nutritionist.  Increase Megace to 400 mg daily. #Severe hypokalemia, potassium level has normalized to 3.0.   Patient will receive IV potassium chloride 20 mEq today. Recommend patient to continue potassium 10 mEq daily. #Fatigue, normal TSH and cortisol level. #Chemotherapy-induced neuropathy, bilateral hands and feet.   Taxol to 135 mg/m due to neuropathy.  Hold treatment today. Continue gabapentin.   # Hypomagnesia, continue slow Mag 1 tab daily, magnesium level is 1.79today.  Stable. We spent sufficient time to discuss many aspect of care, questions were answered to patient's satisfaction. All questions were answered. The patient knows to call the clinic with any problems questions or concerns. Repeat BMP in 3 days.  Possible IV hydration session. Follow-up after CT scan.  Earlie Server, MD, PhD 10/17/2019

## 2019-10-17 NOTE — Progress Notes (Signed)
Pt here today for follow up and treatment. Pt reports not feeling well today, he has not been eating well for about 4 days. Megace not helping with appetite. Pt complains of intermittent pain to left chest. Pt feeling very weak today.  Requesting refill on Dexamthasone & oxycodone

## 2019-10-17 NOTE — Telephone Encounter (Signed)
Done.. Appt was sched as requested Called and made pt aware of his scheduled 10/19/19 appt Labs @ 930 and +/- IV hydration/Kcl in [facility] ok per Sharyn Lull.

## 2019-10-17 NOTE — Progress Notes (Signed)
[place]  [phone] [phone]   Name: Samuel Hood. Date: 10/17/2019 MRN: [id number]  DOB: 1951/01/17  Patient Care Team: Jerrol Banana., MD as PCP - General (Family Medicine) Kate Sable, MD as PCP - Cardiology (Cardiology) Telford Nab, RN as Registered Nurse Clent Jacks, RN as Registered Nurse    REASON FOR CONSULTATION: Samuel Hood. is a 69 y.o. male with multiple medical problems including stage IV adenocarcinoma of the lung (diagnosed 03/30/2019) metastatic to liver and bone and chest wall, also with stage IV HCC (diagnosed 07/17/2018). PMH also notable for HCV, history of alcohol use/tobacco abuse. Palliative care was consulted to help address goals and manage ongoing symptoms..    SOCIAL HISTORY:     reports that he has been smoking. He has a 12.50 pack-year smoking history. He has never used smokeless tobacco. He reports previous alcohol use of about 35.0 standard drinks of alcohol per week. He reports that he does not use drugs.   Patient is a widower after having been married 27 years. He lives at home with a girlfriend. He has 3 daughters and a son. His son is currently incarcerated and he reports having a strained relationship with adaughter due to substance abuse. His daughter, Samuel Hood, is most involved. Patient previously worked in [employer] (medical).  ADVANCE DIRECTIVES:  On file  CODE STATUS:   PAST MEDICAL HISTORY: Past Medical History:  Diagnosis Date   Hepatocellular carcinoma metastatic to left lung ([place]) 07/24/2018   History of angiography    left lower extremity   Hypertension    Small bowel obstruction ([place])     PAST SURGICAL HISTORY:  Past Surgical History:  Procedure Laterality Date   COLON SURGERY     COLONOSCOPY W/ POLYPECTOMY     COLONOSCOPY WITH PROPOFOL N/A 01/31/2018   Procedure: COLONOSCOPY WITH PROPOFOL;  Surgeon: Toledo, Benay Pike, MD;   Location: [facility] ENDOSCOPY;  Service: Gastroenterology;  Laterality: N/A;   debridement fasciotomy leg, left Left 03/19/2016   LAPAROTOMY N/A 09/27/2014   Procedure: EXPLORATORY LAPAROTOMY;  Surgeon: Dia Crawford III, MD;  Location: [facility] ORS;  Service: General;  Laterality: N/A;   PORTA CATH INSERTION N/A 04/10/2019   Procedure: PORTA CATH INSERTION;  Surgeon: Katha Cabal, MD;  Location: [place] [facility] LAB;  Service: Cardiovascular;  Laterality: N/A;    HEMATOLOGY/ONCOLOGY HISTORY:  Oncology History Overview Note  Amias Hutchinson. is a  69 y.o.  male with PMH listed below was seen in consultation at the request of  Pollak, Adriana M, PA-C  for evaluation of lung nodule in the liver lesion. Patient has a past medical history of hypertension, alcohol abuse, smoking emphysema.  He has had serial CTs done in the past.  CT images were independently reviewed by me. 07/26/2017 CT chest with contrast showed extensive pleural-parenchymal scarring within the left upper lobe with several areas of soft tissue nodularity measuring up to 1.6 cm.  In this patient who is at increased risk of lung cancer further investigation with PET scan is recommended. Small chronic appearance loculated hydropneumothorax overlies the left apex. Slowly enlarging lesion within the central portion of the right lobe of liver identified. Per note, patient supposed to have additional work-up done in December repeat CT scan which he did not   Patient was recently seen by primary care provider and has subsequent image done for follow-up. CT on 06/30/2018 showed multiple significantly enlarged paraspinal mass are  are noted concerning.  The largest measures 3.8 x 0.5 cm and there appears to be a lytic destruction of the adjacent T11 vertebral body.  Also noted is new solid density measuring 17 x 12 mm in the pleural parenchymal density in the left upper lobe noted on prior exam.  Concerning for malignancy.  4.4 ascending thoracic aortic  aneurysm.  Recommend annual imaging.   Patient had PET scan done on 07/06/2018 Which showed there are 2 FDG avid lesion within the left upper lobe worrisome for primary lung neoplasm.  There is evidence of chest wall involvement.  Metastasis to the posterior mediastinum is identified with extension into T11 vertebra and possible involvement of the left T12 neural foramina.   Patient reports left shoulder pain.  Smoking half a pack a day not motivated with further questioning quitting Denies weight loss, hemoptysis, cough.  Chronic shortness of breath with exertion. He drinks alcohol daily. Lives with his girlfriend.  He has 5 adult kids   #Hepatitis C   # 6/1?2020  CT-guided biopsy of left-sided posterior mediastinal soft tissue adjacent to T10/11. Patient's case was discussed on tumor board. Consensus was reached that [facility] versus primary lung cancer with hepatoid adenocarcinoma features. Consensus was to proceed with liver biopsy first as liver mass was not FDG avid on PET scan.  Patient underwent liver biopsy and the present to discuss pathology reports.   ## 08/08/2018 Liver mass biopsy showed fragments of necrotic and calcified tissue with adjacent fibrous capsule Scant viable nonneoplastic liver tissue with steatosis, inflammation and non specific fibrosis.  # 08/28/2018 CT guided left upper lobe lung mass biopsy showed poorly differentiated adenocarcinoma of lung origin.  6/19-08/16/2018  Finished palliative radiation to paraspinal soft tissue mass.   Hepatocellular carcinoma metastatic to left lung ([place])  07/24/2018 Initial Diagnosis   Hepatocellular carcinoma metastatic to left lung ([place])   04/16/2019 -  Chemotherapy   The patient had dexamethasone (DECADRON) 4 MG tablet, 8 mg, Oral, Daily, 1 of 1 cycle, Start date: 09/05/2019, End date: -- palonosetron (ALOXI) injection 0.25 mg, 0.25 mg, Intravenous,  Once, 6 of 6 cycles Administration: 0.25 mg (04/16/2019), 0.25 mg (05/09/2019), 0.25 mg  (05/30/2019), 0.25 mg (06/20/2019), 0.25 mg (08/15/2019), 0.25 mg (07/18/2019) CARBOplatin (PARAPLATIN) 470 mg in sodium chloride 0.9 % 250 mL chemo infusion, 470 mg (100 % of original dose 465.5 mg), Intravenous,  Once, 6 of 6 cycles Dose modification:   (original dose 465.5 mg, Cycle 1) Administration: 470 mg (04/16/2019), 470 mg (05/09/2019), 470 mg (05/30/2019), 470 mg (06/20/2019), 420 mg (08/15/2019), 420 mg (07/18/2019) fosaprepitant (EMEND) 150 mg in sodium chloride 0.9 % 145 mL IVPB, 150 mg, Intravenous,  Once, 6 of 6 cycles Administration: 150 mg (04/16/2019), 150 mg (05/09/2019), 150 mg (05/30/2019), 150 mg (06/20/2019), 150 mg (08/15/2019), 150 mg (07/18/2019) PACLitaxel (TAXOL) 354 mg in sodium chloride 0.9 % 500 mL chemo infusion (> 21m/m2), 200 mg/m2 = 354 mg, Intravenous,  Once, 6 of 6 cycles Dose modification: 175 mg/m2 (original dose 200 mg/m2, Cycle 6, Reason: Provider Judgment), 135 mg/m2 (original dose 200 mg/m2, Cycle 6, Reason: Other (see comments), Comment: neuropathy) Administration: 354 mg (04/16/2019), 354 mg (05/09/2019), 354 mg (05/30/2019), 354 mg (06/20/2019), 240 mg (08/15/2019), 306 mg (07/18/2019) atezolizumab (TECENTRIQ) 1,200 mg in sodium chloride 0.9 % 250 mL chemo infusion, 1,200 mg, Intravenous, Once, 8 of 10 cycles Administration: 1,200 mg (05/09/2019), 1,200 mg (05/30/2019), 1,200 mg (06/20/2019), 1,200 mg (08/15/2019), 1,200 mg (09/05/2019), 1,200 mg (04/18/2019), 1,200 mg (07/18/2019), 1,200 mg (  09/26/2019) bevacizumab-bvzr (ZIRABEV) 1,000 mg in sodium chloride 0.9 % 100 mL chemo infusion, 15 mg/kg = 1,000 mg, Intravenous,  Once, 8 of 10 cycles Administration: 1,000 mg (05/09/2019), 1,000 mg (05/30/2019), 1,000 mg (06/20/2019), 1,000 mg (08/15/2019), 1,000 mg (09/05/2019), 1,000 mg (04/18/2019), 1,000 mg (07/18/2019), 900 mg (09/26/2019)  for chemotherapy treatment.    Stage IV adenocarcinoma of lung, left ([place])  09/02/2018 Initial Diagnosis   Primary lung adenocarcinoma, left ([place])   04/16/2019 -  Chemotherapy    The patient had dexamethasone (DECADRON) 4 MG tablet, 8 mg, Oral, Daily, 1 of 1 cycle, Start date: 09/05/2019, End date: -- palonosetron (ALOXI) injection 0.25 mg, 0.25 mg, Intravenous,  Once, 6 of 6 cycles Administration: 0.25 mg (04/16/2019), 0.25 mg (05/09/2019), 0.25 mg (05/30/2019), 0.25 mg (06/20/2019), 0.25 mg (08/15/2019), 0.25 mg (07/18/2019) CARBOplatin (PARAPLATIN) 470 mg in sodium chloride 0.9 % 250 mL chemo infusion, 470 mg (100 % of original dose 465.5 mg), Intravenous,  Once, 6 of 6 cycles Dose modification:   (original dose 465.5 mg, Cycle 1) Administration: 470 mg (04/16/2019), 470 mg (05/09/2019), 470 mg (05/30/2019), 470 mg (06/20/2019), 420 mg (08/15/2019), 420 mg (07/18/2019) fosaprepitant (EMEND) 150 mg in sodium chloride 0.9 % 145 mL IVPB, 150 mg, Intravenous,  Once, 6 of 6 cycles Administration: 150 mg (04/16/2019), 150 mg (05/09/2019), 150 mg (05/30/2019), 150 mg (06/20/2019), 150 mg (08/15/2019), 150 mg (07/18/2019) PACLitaxel (TAXOL) 354 mg in sodium chloride 0.9 % 500 mL chemo infusion (> 21m/m2), 200 mg/m2 = 354 mg, Intravenous,  Once, 6 of 6 cycles Dose modification: 175 mg/m2 (original dose 200 mg/m2, Cycle 6, Reason: Provider Judgment), 135 mg/m2 (original dose 200 mg/m2, Cycle 6, Reason: Other (see comments), Comment: neuropathy) Administration: 354 mg (04/16/2019), 354 mg (05/09/2019), 354 mg (05/30/2019), 354 mg (06/20/2019), 240 mg (08/15/2019), 306 mg (07/18/2019) atezolizumab (TECENTRIQ) 1,200 mg in sodium chloride 0.9 % 250 mL chemo infusion, 1,200 mg, Intravenous, Once, 8 of 10 cycles Administration: 1,200 mg (05/09/2019), 1,200 mg (05/30/2019), 1,200 mg (06/20/2019), 1,200 mg (08/15/2019), 1,200 mg (09/05/2019), 1,200 mg (04/18/2019), 1,200 mg (07/18/2019), 1,200 mg (09/26/2019) bevacizumab-bvzr (ZIRABEV) 1,000 mg in sodium chloride 0.9 % 100 mL chemo infusion, 15 mg/kg = 1,000 mg, Intravenous,  Once, 8 of 10 cycles Administration: 1,000 mg (05/09/2019), 1,000 mg (05/30/2019), 1,000 mg (06/20/2019), 1,000 mg  (08/15/2019), 1,000 mg (09/05/2019), 1,000 mg (04/18/2019), 1,000 mg (07/18/2019), 900 mg (09/26/2019)  for chemotherapy treatment.    [place]  12/16/2018 Initial Diagnosis   Cancer, [place] to bone ([place]   04/16/2019 -  Chemotherapy   The patient had dexamethasone (DECADRON) 4 MG tablet, 8 mg, Oral, Daily, 1 of 1 cycle, Start date: 09/05/2019, End date: -- palonosetron (ALOXI) injection 0.25 mg, 0.25 mg, Intravenous,  Once, 6 of 6 cycles Administration: 0.25 mg (04/16/2019), 0.25 mg (05/09/2019), 0.25 mg (05/30/2019), 0.25 mg (06/20/2019), 0.25 mg (08/15/2019), 0.25 mg (07/18/2019) CARBOplatin (PARAPLATIN) 470 mg in sodium chloride 0.9 % 250 mL chemo infusion, 470 mg (100 % of original dose 465.5 mg), Intravenous,  Once, 6 of 6 cycles Dose modification:   (original dose 465.5 mg, Cycle 1) Administration: 470 mg (04/16/2019), 470 mg (05/09/2019), 470 mg (05/30/2019), 470 mg (06/20/2019), 420 mg (08/15/2019), 420 mg (07/18/2019) fosaprepitant (EMEND) 150 mg in sodium chloride 0.9 % 145 mL IVPB, 150 mg, Intravenous,  Once, 6 of 6 cycles Administration: 150 mg (04/16/2019), 150 mg (05/09/2019), 150 mg (05/30/2019), 150 mg (06/20/2019), 150 mg (08/15/2019), 150 mg (07/18/2019) PACLitaxel (TAXOL) 354 mg in sodium chloride 0.9 %  500 mL chemo infusion (> $RemoveBef'80mg'qSrMrojSwy$ /m2), 200 mg/m2 = 354 mg, Intravenous,  Once, 6 of 6 cycles Dose modification: 175 mg/m2 (original dose 200 mg/m2, Cycle 6, Reason: Provider Judgment), 135 mg/m2 (original dose 200 mg/m2, Cycle 6, Reason: Other (see comments), Comment: neuropathy) Administration: 354 mg (04/16/2019), 354 mg (05/09/2019), 354 mg (05/30/2019), 354 mg (06/20/2019), 240 mg (08/15/2019), 306 mg (07/18/2019) atezolizumab (TECENTRIQ) 1,200 mg in sodium chloride 0.9 % 250 mL chemo infusion, 1,200 mg, Intravenous, Once, 8 of 10 cycles Administration: 1,200 mg (05/09/2019), 1,200 mg (05/30/2019), 1,200 mg (06/20/2019), 1,200 mg (08/15/2019), 1,200 mg (09/05/2019), 1,200 mg (04/18/2019), 1,200 mg  (07/18/2019), 1,200 mg (09/26/2019) bevacizumab-bvzr (ZIRABEV) 1,000 mg in sodium chloride 0.9 % 100 mL chemo infusion, 15 mg/kg = 1,000 mg, Intravenous,  Once, 8 of 10 cycles Administration: 1,000 mg (05/09/2019), 1,000 mg (05/30/2019), 1,000 mg (06/20/2019), 1,000 mg (08/15/2019), 1,000 mg (09/05/2019), 1,000 mg (04/18/2019), 1,000 mg (07/18/2019), 900 mg (09/26/2019)  for chemotherapy treatment.      ALLERGIES:  is allergic to 5-alpha reductase inhibitors.  MEDICATIONS:  Current Outpatient Medications  Medication Sig Dispense Refill   dexamethasone (DECADRON) 4 MG tablet Take 2 tablets (8 mg total) by mouth daily. Start the day after carboplatin chemotherapy for 3 days. 16 tablet 1   fentaNYL (DURAGESIC) 25 MCG/HR Place 1 patch onto the skin every 3 (three) days. 10 patch 0   gabapentin (NEURONTIN) 300 MG capsule Take 1 capsule (300 mg total) by mouth 4 (four) times daily. 120 capsule 0   hydrochlorothiazide (HYDRODIURIL) 25 MG tablet Take 1 tablet (25 mg total) by mouth daily. 30 tablet 12   lidocaine-prilocaine (EMLA) cream Apply to affected area once (Patient taking differently: Apply 1 application topically daily as needed (port acess). ) 30 g 3   lidocaine-prilocaine (EMLA) cream Apply 1 application topically as needed.     loperamide (IMODIUM) 2 MG capsule Take 1 capsule (2 mg total) by mouth See admin instructions. With onset of loose stool, take $RemoveBef'4mg'auykMifTXn$  followed by $RemoveBef'2mg'oWVXpnWPqx$  every 2 hours until 12 hours have passed without loose bowel movement. Maximum: 16 mg/day 60 capsule 1   magnesium chloride (SLOW-MAG) 64 MG TBEC SR tablet Take 1 tablet (64 mg total) by mouth daily. 30 tablet 0   megestrol (MEGACE) 400 MG/10ML suspension Take 10 mLs (400 mg total) by mouth daily. 240 mL 1   Multiple Vitamin (MULTIVITAMIN) capsule Take 1 capsule by mouth daily.     NARCAN 4 MG/0.1ML LIQD nasal spray kit 1 spray once.  (Patient not taking: Reported on 09/25/2019)     ondansetron (ZOFRAN) 8 MG tablet Take 1  tablet (8 mg total) by mouth 2 (two) times daily as needed for refractory nausea / vomiting. Start on day 3 after carboplatin chemo. 30 tablet 1   oxyCODONE (ROXICODONE) 5 MG immediate release tablet Take 1 tablet (5 mg total) by mouth every 8 (eight) hours as needed for severe pain or breakthrough pain. 60 tablet 0   polyethylene glycol (MIRALAX / GLYCOLAX) 17 g packet Take 17 g by mouth daily. 14 each 0   potassium chloride (KLOR-CON) 10 MEQ tablet Take 1 tablet (10 mEq total) by mouth daily. 60 tablet 0   prochlorperazine (COMPAZINE) 10 MG tablet Take 1 tablet (10 mg total) by mouth every 6 (six) hours as needed (Nausea or vomiting). 30 tablet 1   senna (SENOKOT) 8.6 MG TABS tablet Take 1 tablet (8.6 mg total) by mouth daily. 120 tablet 0   No current facility-administered medications for  this visit.   Facility-Administered Medications Ordered in Other Visits  Medication Dose Route Frequency Provider Last Rate Last Admin   sodium chloride flush (NS) 0.9 % injection 10 mL  10 mL Intravenous PRN Earlie Server, MD   10 mL at 10/17/19 1057    VITAL SIGNS: There were no vitals taken for this visit. There were no vitals filed for this visit.  Estimated body mass index is 21 kg/m as calculated from the following:   Height as of 09/18/19: $RemoveBe'5\' 5"'gSkCWJxIv$  (1.651 m).   Weight as of an earlier encounter on 10/17/19: 126 lb 3.2 oz (57.2 kg).  LABS: CBC:    Component Value Date/Time   WBC 8.5 10/17/2019 0819   HGB 14.2 10/17/2019 0819   HGB 14.6 06/23/2018 1100   HCT 40.5 10/17/2019 0819   HCT 43.2 06/23/2018 1100   PLT 293 10/17/2019 0819   PLT 297 06/23/2018 1100   MCV 84.0 10/17/2019 0819   MCV 96 06/23/2018 1100   MCV 88 09/14/2013 0511   NEUTROABS 6.4 10/17/2019 0819   NEUTROABS 2.8 06/23/2018 1100   NEUTROABS 12.4 (H) 09/14/2013 0511   LYMPHSABS 1.1 10/17/2019 0819   LYMPHSABS 1.8 06/23/2018 1100   LYMPHSABS 2.3 09/14/2013 0511   MONOABS 1.0 10/17/2019 0819   MONOABS 1.1 (H) 09/14/2013 0511    EOSABS 0.0 10/17/2019 0819   EOSABS 0.1 06/23/2018 1100   EOSABS 0.2 09/14/2013 0511   BASOSABS 0.0 10/17/2019 0819   BASOSABS 0.0 06/23/2018 1100   BASOSABS 0.0 09/14/2013 0511   Comprehensive Metabolic Panel:    Component Value Date/Time   NA 129 (L) 10/17/2019 0819   NA 136 06/23/2018 1100   NA 139 09/13/2013 0408   K 3.0 (L) 10/17/2019 0819   K 3.3 (L) 09/13/2013 0408   CL 90 (L) 10/17/2019 0819   CL 105 09/13/2013 0408   CO2 29 10/17/2019 0819   CO2 27 09/13/2013 0408   BUN 14 10/17/2019 0819   BUN 6 (L) 06/23/2018 1100   BUN 4 (L) 09/13/2013 0408   CREATININE 0.75 10/17/2019 0819   CREATININE 0.67 09/13/2013 0408   GLUCOSE 132 (H) 10/17/2019 0819   GLUCOSE 108 (H) 09/13/2013 0408   CALCIUM 8.5 (L) 10/17/2019 0819   CALCIUM 8.1 (L) 09/13/2013 0408   [place] 48 (H) 10/17/2019 0819   [place] 30 09/13/2013 0408   ALT 43 10/17/2019 0819   ALT 27 09/13/2013 0408   ALKPHOS 63 10/17/2019 0819   ALKPHOS 218 (H) 09/13/2013 0408   BILITOT 1.0 10/17/2019 0819   BILITOT 0.5 06/23/2018 1100   BILITOT 0.9 09/13/2013 0408   PROT 7.3 10/17/2019 0819   PROT 7.4 06/23/2018 1100   PROT 6.2 (L) 09/13/2013 0408   ALBUMIN 3.2 (L) 10/17/2019 0819   ALBUMIN 4.0 06/23/2018 1100   ALBUMIN 1.9 (L) 09/13/2013 0408    RADIOGRAPHIC STUDIES: No results found.  PERFORMANCE STATUS (ECOG) : 1 - Symptomatic but completely ambulatory  Review of Systems Unless otherwise noted, a complete review of systems is negative.  Physical Exam General: NAD Pulmonary: unlabored Extremities: no edema, no joint deformities Skin: no rashes Neurological: Weakness but otherwise nonfocal  IMPRESSION: Routine follow-up visit.  Patient was seen in the infusion area.  Patient reports that he has had progressive fatigue and poor appetite.  He has had continued weight loss with weight down to 126 pounds today, which reflects a decline of 10 pounds over the past 2 months.  Patient states that he is drinking  ensures  3-4 times a day just does not have much appetite besides that.  He was on Megace at low-dose.  Patient is interested in alternative appetite stimulants.  Discussed with Dr. Tasia Catchings.  Will increase dose of Megace to 400 mg daily.  I have previously referred to home-based palliative care, which patient said that he declined but is now interested.  We will send a new referral.  Patient previously completed [facility] [place] POA and living will, which are uploaded into [employer].  I reintroduced the MOST form today but patient did not seem interested in talking about it.  We will readdress during a future visit.  PLAN: -Continue current scope of treatment -Refill oxycodone 5 mg every 8 hours as needed (#60) -Increase Megace 400 mg daily -Plan to readdress MOST form at time of future clinic visit -RTC in 3 weeks   Patient expressed understanding and was in agreement with this plan. He also understands that He can call the clinic at any time with any questions, concerns, or complaints.     Time Total: 20 minutes  Visit consisted of counseling and education dealing with the complex and emotionally intense issues of symptom management and palliative care in the setting of serious and potentially life-threatening illness.Greater than 50%  of this time was spent counseling and coordinating care related to the above assessment and plan.  Signed by: Altha Harm, PhD, NP-C

## 2019-10-17 NOTE — Telephone Encounter (Signed)
Per [place] from Dr. Tasia Catchings:  CT is next week. please schedule him to come on Friday and do BMP. +/- IV hydration/Kcl .  follow up TBD,  pending CT results.

## 2019-10-18 LAB — AFP TUMOR MARKER: AFP, Serum, Tumor Marker: 8.4 ng/mL — ABNORMAL HIGH (ref 0.0–8.3)

## 2019-10-19 ENCOUNTER — Other Ambulatory Visit: Payer: Self-pay

## 2019-10-19 ENCOUNTER — Inpatient Hospital Stay: Payer: Medicare Other

## 2019-10-19 ENCOUNTER — Inpatient Hospital Stay: Payer: Medicare Other | Admitting: Nurse Practitioner

## 2019-10-19 VITALS — BP 98/77 | HR 92 | Temp 97.0°F | Resp 18

## 2019-10-19 DIAGNOSIS — G893 Neoplasm related pain (acute) (chronic): Secondary | ICD-10-CM

## 2019-10-19 DIAGNOSIS — C220 Liver cell carcinoma: Secondary | ICD-10-CM

## 2019-10-19 DIAGNOSIS — C787 Secondary malignant neoplasm of liver and intrahepatic bile duct: Secondary | ICD-10-CM | POA: Diagnosis not present

## 2019-10-19 DIAGNOSIS — R000 Tachycardia, unspecified: Secondary | ICD-10-CM | POA: Diagnosis not present

## 2019-10-19 DIAGNOSIS — J449 Chronic obstructive pulmonary disease, unspecified: Secondary | ICD-10-CM | POA: Diagnosis not present

## 2019-10-19 DIAGNOSIS — Z5111 Encounter for antineoplastic chemotherapy: Secondary | ICD-10-CM

## 2019-10-19 DIAGNOSIS — C3412 Malignant neoplasm of upper lobe, left bronchus or lung: Secondary | ICD-10-CM | POA: Diagnosis not present

## 2019-10-19 DIAGNOSIS — I959 Hypotension, unspecified: Secondary | ICD-10-CM | POA: Diagnosis not present

## 2019-10-19 DIAGNOSIS — I700 Atherosclerosis of aorta: Secondary | ICD-10-CM | POA: Diagnosis not present

## 2019-10-19 DIAGNOSIS — I712 Thoracic aortic aneurysm, without rupture: Secondary | ICD-10-CM | POA: Diagnosis not present

## 2019-10-19 DIAGNOSIS — T451X5A Adverse effect of antineoplastic and immunosuppressive drugs, initial encounter: Secondary | ICD-10-CM | POA: Diagnosis not present

## 2019-10-19 DIAGNOSIS — C3492 Malignant neoplasm of unspecified part of left bronchus or lung: Secondary | ICD-10-CM

## 2019-10-19 DIAGNOSIS — E876 Hypokalemia: Secondary | ICD-10-CM | POA: Diagnosis not present

## 2019-10-19 DIAGNOSIS — R634 Abnormal weight loss: Secondary | ICD-10-CM | POA: Diagnosis not present

## 2019-10-19 DIAGNOSIS — C7802 Secondary malignant neoplasm of left lung: Secondary | ICD-10-CM

## 2019-10-19 DIAGNOSIS — K8020 Calculus of gallbladder without cholecystitis without obstruction: Secondary | ICD-10-CM | POA: Diagnosis not present

## 2019-10-19 DIAGNOSIS — C7951 Secondary malignant neoplasm of bone: Secondary | ICD-10-CM | POA: Diagnosis not present

## 2019-10-19 DIAGNOSIS — G620 Drug-induced polyneuropathy: Secondary | ICD-10-CM | POA: Diagnosis not present

## 2019-10-19 DIAGNOSIS — M47816 Spondylosis without myelopathy or radiculopathy, lumbar region: Secondary | ICD-10-CM | POA: Diagnosis not present

## 2019-10-19 LAB — COMPREHENSIVE METABOLIC PANEL
ALT: 34 U/L (ref 0–44)
AST: 37 U/L (ref 15–41)
Albumin: 3.1 g/dL — ABNORMAL LOW (ref 3.5–5.0)
Alkaline Phosphatase: 61 U/L (ref 38–126)
Anion gap: 14 (ref 5–15)
BUN: 10 mg/dL (ref 8–23)
CO2: 25 mmol/L (ref 22–32)
Calcium: 8.8 mg/dL — ABNORMAL LOW (ref 8.9–10.3)
Chloride: 91 mmol/L — ABNORMAL LOW (ref 98–111)
Creatinine, Ser: 0.72 mg/dL (ref 0.61–1.24)
GFR calc Af Amer: >60 mL/min (ref >60)
GFR calc non Af Amer: >60 mL/min (ref >60)
Glucose, Bld: 168 mg/dL — ABNORMAL HIGH (ref 70–99)
Potassium: 3 mmol/L — ABNORMAL LOW (ref 3.5–5.1)
Sodium: 130 mmol/L — ABNORMAL LOW (ref 135–145)
Total Bilirubin: 0.7 mg/dL (ref 0.3–1.2)
Total Protein: 7 g/dL (ref 6.5–8.1)

## 2019-10-19 LAB — MAGNESIUM: Magnesium: 1.6 mg/dL — ABNORMAL LOW (ref 1.7–2.4)

## 2019-10-19 MED ORDER — HEPARIN SOD (PORK) LOCK FLUSH 100 UNIT/ML IV SOLN
500.0000 [IU] | Freq: Once | INTRAVENOUS | Status: DC
  Filled 2019-10-19: qty 5

## 2019-10-19 MED ORDER — HEPARIN SOD (PORK) LOCK FLUSH 100 UNIT/ML IV SOLN
500.0000 [IU] | Freq: Once | INTRAVENOUS | Status: AC
  Administered 2019-10-19: 500 [IU] via INTRAVENOUS
  Filled 2019-10-19: qty 5

## 2019-10-19 MED ORDER — SODIUM CHLORIDE 0.9 % IV SOLN
INTRAVENOUS | Status: DC
  Filled 2019-10-19: qty 250

## 2019-10-19 MED ORDER — SODIUM CHLORIDE 0.9% FLUSH
10.0000 mL | INTRAVENOUS | Status: DC | PRN
  Filled 2019-10-19: qty 10

## 2019-10-19 MED ORDER — SODIUM CHLORIDE 0.9 % IV SOLN
Freq: Once | INTRAVENOUS | Status: AC
  Filled 2019-10-19: qty 1000

## 2019-10-19 MED ORDER — SODIUM CHLORIDE 0.9% FLUSH
10.0000 mL | Freq: Once | INTRAVENOUS | Status: AC
  Administered 2019-10-19: 10 mL via INTRAVENOUS
  Filled 2019-10-19: qty 10

## 2019-10-19 MED ORDER — OXYCODONE HCL 5 MG PO TABS
5.0000 mg | ORAL_TABLET | Freq: Once | ORAL | Status: AC
  Administered 2019-10-19: 5 mg via ORAL
  Filled 2019-10-19: qty 1

## 2019-10-19 NOTE — Progress Notes (Signed)
Pt arrived to [facility] infusion c/o anterior surface type pain left pectoral area. States he has had this pain for 2 weeks now. He has been taking tylenol as his pain pills ran out. Feeling weak,  Mild dyspnea with exertion. Received IVF's with Mg and KCL. He also received 1 oxycodone 5mg  tab with complete resolution of pain. He drank ginger ale and ate a pack of cheese crackers while in clinic. He states he does not feel like he needs to see a NP now as his symptoms have improved. Pt has slept on and off. BP is slightly lower after reclining and taking pain pill. Pt ambulatory at discharge with no complaints

## 2019-10-24 ENCOUNTER — Ambulatory Visit
Admission: RE | Admit: 2019-10-24 | Discharge: 2019-10-24 | Disposition: A | Discharge status: Home / Self Care | Payer: Medicare Other | Source: Ambulatory Visit | Attending: Oncology | Admitting: Oncology

## 2019-10-24 ENCOUNTER — Other Ambulatory Visit: Payer: Self-pay

## 2019-10-24 DIAGNOSIS — C220 Liver cell carcinoma: Secondary | ICD-10-CM | POA: Insufficient documentation

## 2019-10-24 DIAGNOSIS — K8020 Calculus of gallbladder without cholecystitis without obstruction: Secondary | ICD-10-CM | POA: Diagnosis not present

## 2019-10-24 DIAGNOSIS — J439 Emphysema, unspecified: Secondary | ICD-10-CM | POA: Diagnosis not present

## 2019-10-24 DIAGNOSIS — C3492 Malignant neoplasm of unspecified part of left bronchus or lung: Secondary | ICD-10-CM | POA: Insufficient documentation

## 2019-10-24 DIAGNOSIS — C7802 Secondary malignant neoplasm of left lung: Secondary | ICD-10-CM | POA: Diagnosis not present

## 2019-10-24 MED ORDER — IOHEXOL 300 MG/ML  SOLN
85.0000 mL | Freq: Once | INTRAMUSCULAR | Status: AC | PRN
  Administered 2019-10-24: 85 mL via INTRAVENOUS

## 2019-10-26 ENCOUNTER — Inpatient Hospital Stay: Payer: Medicare Other

## 2019-10-26 ENCOUNTER — Inpatient Hospital Stay (HOSPITAL_BASED_OUTPATIENT_CLINIC_OR_DEPARTMENT_OTHER): Payer: Medicare Other | Admitting: Hospice and Palliative Medicine

## 2019-10-26 ENCOUNTER — Encounter: Payer: Self-pay | Admitting: Oncology

## 2019-10-26 ENCOUNTER — Other Ambulatory Visit: Payer: Self-pay

## 2019-10-26 ENCOUNTER — Inpatient Hospital Stay (HOSPITAL_BASED_OUTPATIENT_CLINIC_OR_DEPARTMENT_OTHER): Payer: Medicare Other | Admitting: Oncology

## 2019-10-26 VITALS — BP 105/81 | HR 90 | Temp 97.3°F | Resp 18

## 2019-10-26 VITALS — BP 95/67 | HR 100 | Temp 98.3°F | Resp 18 | Wt 125.1 lb

## 2019-10-26 DIAGNOSIS — C3492 Malignant neoplasm of unspecified part of left bronchus or lung: Secondary | ICD-10-CM

## 2019-10-26 DIAGNOSIS — R634 Abnormal weight loss: Secondary | ICD-10-CM | POA: Diagnosis not present

## 2019-10-26 DIAGNOSIS — C220 Liver cell carcinoma: Secondary | ICD-10-CM

## 2019-10-26 DIAGNOSIS — E876 Hypokalemia: Secondary | ICD-10-CM

## 2019-10-26 DIAGNOSIS — Z515 Encounter for palliative care: Secondary | ICD-10-CM

## 2019-10-26 DIAGNOSIS — R000 Tachycardia, unspecified: Secondary | ICD-10-CM | POA: Diagnosis not present

## 2019-10-26 DIAGNOSIS — G620 Drug-induced polyneuropathy: Secondary | ICD-10-CM

## 2019-10-26 DIAGNOSIS — C787 Secondary malignant neoplasm of liver and intrahepatic bile duct: Secondary | ICD-10-CM | POA: Diagnosis not present

## 2019-10-26 DIAGNOSIS — C7802 Secondary malignant neoplasm of left lung: Secondary | ICD-10-CM | POA: Diagnosis not present

## 2019-10-26 DIAGNOSIS — C3412 Malignant neoplasm of upper lobe, left bronchus or lung: Secondary | ICD-10-CM | POA: Diagnosis not present

## 2019-10-26 DIAGNOSIS — C7951 Secondary malignant neoplasm of bone: Secondary | ICD-10-CM | POA: Diagnosis not present

## 2019-10-26 DIAGNOSIS — T451X5A Adverse effect of antineoplastic and immunosuppressive drugs, initial encounter: Secondary | ICD-10-CM

## 2019-10-26 DIAGNOSIS — J449 Chronic obstructive pulmonary disease, unspecified: Secondary | ICD-10-CM | POA: Diagnosis not present

## 2019-10-26 DIAGNOSIS — I700 Atherosclerosis of aorta: Secondary | ICD-10-CM | POA: Diagnosis not present

## 2019-10-26 DIAGNOSIS — Z7189 Other specified counseling: Secondary | ICD-10-CM

## 2019-10-26 DIAGNOSIS — K8020 Calculus of gallbladder without cholecystitis without obstruction: Secondary | ICD-10-CM | POA: Diagnosis not present

## 2019-10-26 DIAGNOSIS — R5383 Other fatigue: Secondary | ICD-10-CM

## 2019-10-26 DIAGNOSIS — M47816 Spondylosis without myelopathy or radiculopathy, lumbar region: Secondary | ICD-10-CM | POA: Diagnosis not present

## 2019-10-26 DIAGNOSIS — I712 Thoracic aortic aneurysm, without rupture: Secondary | ICD-10-CM | POA: Diagnosis not present

## 2019-10-26 DIAGNOSIS — I959 Hypotension, unspecified: Secondary | ICD-10-CM | POA: Diagnosis not present

## 2019-10-26 DIAGNOSIS — G893 Neoplasm related pain (acute) (chronic): Secondary | ICD-10-CM | POA: Diagnosis not present

## 2019-10-26 LAB — BASIC METABOLIC PANEL
Anion gap: 11 (ref 5–15)
BUN: 10 mg/dL (ref 8–23)
CO2: 27 mmol/L (ref 22–32)
Calcium: 8.7 mg/dL — ABNORMAL LOW (ref 8.9–10.3)
Chloride: 95 mmol/L — ABNORMAL LOW (ref 98–111)
Creatinine, Ser: 0.61 mg/dL (ref 0.61–1.24)
GFR calc Af Amer: >60 mL/min (ref >60)
GFR calc non Af Amer: >60 mL/min (ref >60)
Glucose, Bld: 99 mg/dL (ref 70–99)
Potassium: 3.2 mmol/L — ABNORMAL LOW (ref 3.5–5.1)
Sodium: 133 mmol/L — ABNORMAL LOW (ref 135–145)

## 2019-10-26 LAB — CORTISOL-PM, BLOOD: Cortisol - PM: 24.4 ug/dL — ABNORMAL HIGH (ref <10.0)

## 2019-10-26 MED ORDER — DEXAMETHASONE 4 MG PO TABS: 4.0000 mg | ORAL_TABLET | Freq: Every day | ORAL | 0 refills | Status: DC

## 2019-10-26 MED ORDER — POTASSIUM CHLORIDE ER 10 MEQ PO TBCR: 10.0000 meq | EXTENDED_RELEASE_TABLET | Freq: Every day | ORAL | 1 refills | Status: DC

## 2019-10-26 MED ORDER — ASPIRIN EC 81 MG PO TBEC: 81.0000 mg | DELAYED_RELEASE_TABLET | Freq: Every day | ORAL | 1 refills | Status: AC

## 2019-10-26 MED ORDER — POTASSIUM CHLORIDE 20 MEQ/100ML IV SOLN
20.0000 meq | Freq: Once | INTRAVENOUS | Status: AC
  Administered 2019-10-26: 20 meq via INTRAVENOUS

## 2019-10-26 MED ORDER — SODIUM CHLORIDE 0.9 % IV SOLN
INTRAVENOUS | Status: DC
  Filled 2019-10-26 (×2): qty 250

## 2019-10-26 MED ORDER — SODIUM CHLORIDE 0.9% FLUSH
10.0000 mL | Freq: Once | INTRAVENOUS | Status: AC | PRN
  Administered 2019-10-26: 10 mL
  Filled 2019-10-26: qty 10

## 2019-10-26 MED ORDER — MAGNESIUM CHLORIDE 64 MG PO TBEC: 1.0000 | DELAYED_RELEASE_TABLET | Freq: Every day | ORAL | 1 refills | Status: AC

## 2019-10-26 MED ORDER — HEPARIN SOD (PORK) LOCK FLUSH 100 UNIT/ML IV SOLN
500.0000 [IU] | Freq: Once | INTRAVENOUS | Status: AC | PRN
  Administered 2019-10-26: 500 [IU]
  Filled 2019-10-26: qty 5

## 2019-10-26 MED ORDER — GABAPENTIN 300 MG PO CAPS: 600.0000 mg | ORAL_CAPSULE | Freq: Three times a day (TID) | ORAL | 0 refills | Status: DC

## 2019-10-26 NOTE — Progress Notes (Signed)
Add on [facility] for labs and IVF's for hypotension and weakness. Denies pain. Patient enjoying nabs and sprite. Resting comfortably. Potassium 20 mEq over 1 hour per MD. Pt has a port.

## 2019-10-26 NOTE — Progress Notes (Signed)
[place]  [phone] [phone]   Name: Dyon Rotert. Date: 10/26/2019 MRN: [id number]  DOB: Jun 02, 1950  Patient Care Team: Jerrol Banana., MD as PCP - General (Family Medicine) Kate Sable, MD as PCP - Cardiology (Cardiology) Telford Nab, RN as Registered Nurse Clent Jacks, RN as Registered Nurse    REASON FOR CONSULTATION: Alanzo Lamb. is a 69 y.o. male with multiple medical problems including stage IV adenocarcinoma of the lung (diagnosed 03/30/2019) metastatic to liver and bone and chest wall, also with stage IV HCC (diagnosed 07/17/2018). PMH also notable for HCV, history of alcohol use/tobacco abuse. Palliative care was consulted to help address goals and manage ongoing symptoms..    SOCIAL HISTORY:     reports that he has been smoking. He has a 12.50 pack-year smoking history. He has never used smokeless tobacco. He reports previous alcohol use of about 35.0 standard drinks of alcohol per week. He reports that he does not use drugs.   Patient is a widower after having been married 27 years. He lives at home with a girlfriend. He has 3 daughters and a son. His son is currently incarcerated and he reports having a strained relationship with adaughter due to substance abuse. His daughter, [name], is most involved. Patient previously worked in [employer] (medical).  ADVANCE DIRECTIVES:  On file  CODE STATUS: DNR/DNI (DNR form completed on 10/26/2019)  PAST MEDICAL HISTORY: Past Medical History:  Diagnosis Date  . Hepatocellular carcinoma metastatic to left lung ([place]) 07/24/2018  . History of angiography    left lower extremity  . Hypertension   . Small bowel obstruction ([place])     PAST SURGICAL HISTORY:  Past Surgical History:  Procedure Laterality Date  . COLON SURGERY    . COLONOSCOPY W/ POLYPECTOMY    . COLONOSCOPY WITH PROPOFOL N/A 01/31/2018   Procedure: COLONOSCOPY WITH  PROPOFOL;  Surgeon: Toledo, Benay Pike, MD;  Location: [facility] ENDOSCOPY;  Service: Gastroenterology;  Laterality: N/A;  . debridement fasciotomy leg, left Left 03/19/2016  . LAPAROTOMY N/A 09/27/2014   Procedure: EXPLORATORY LAPAROTOMY;  Surgeon: Dia Crawford III, MD;  Location: [facility] ORS;  Service: General;  Laterality: N/A;  . PORTA CATH INSERTION N/A 04/10/2019   Procedure: PORTA CATH INSERTION;  Surgeon: Katha Cabal, MD;  Location: [facility];  Service: Cardiovascular;  Laterality: N/A;    HEMATOLOGY/ONCOLOGY HISTORY:  Oncology History Overview Note  Jaion Lagrange. is a  69 y.o.  male with PMH listed below was seen in consultation at the request of  Pollak, Adriana M, PA-C  for evaluation of lung nodule in the liver lesion. Patient has a past medical history of hypertension, alcohol abuse, smoking emphysema.  He has had serial CTs done in the past.  CT images were independently reviewed by me. 07/26/2017 CT chest with contrast showed extensive pleural-parenchymal scarring within the left upper lobe with several areas of soft tissue nodularity measuring up to 1.6 cm.  In this patient who is at increased risk of lung cancer further investigation with PET scan is recommended. Small chronic appearance loculated hydropneumothorax overlies the left apex. Slowly enlarging lesion within the central portion of the right lobe of liver identified. Per note, patient supposed to have additional work-up done in December repeat CT scan which he did not   Patient was recently seen by primary care provider and has subsequent image done for follow-up. CT on 06/30/2018 showed multiple  significantly enlarged paraspinal mass are noted concerning.  The largest measures 3.8 x 0.5 cm and there appears to be a lytic destruction of the adjacent T11 vertebral body.  Also noted is new solid density measuring 17 x 12 mm in the pleural parenchymal density in the left upper lobe noted on prior exam.  Concerning for  malignancy.  4.4 ascending thoracic aortic aneurysm.  Recommend annual imaging.   Patient had PET scan done on 07/06/2018 Which showed there are 2 FDG avid lesion within the left upper lobe worrisome for primary lung neoplasm.  There is evidence of chest wall involvement.  Metastasis to the posterior mediastinum is identified with extension into T11 vertebra and possible involvement of the left T12 neural foramina.   Patient reports left shoulder pain.  Smoking half a pack a day not motivated with further questioning quitting Denies weight loss, hemoptysis, cough.  Chronic shortness of breath with exertion. He drinks alcohol daily. Lives with his girlfriend.  He has 5 adult kids   #Hepatitis C   # 6/1?2020  CT-guided biopsy of left-sided posterior mediastinal soft tissue adjacent to T10/11. Patient's case was discussed on tumor board. Consensus was reached that [facility] versus primary lung cancer with hepatoid adenocarcinoma features. Consensus was to proceed with liver biopsy first as liver mass was not FDG avid on PET scan.  Patient underwent liver biopsy and the present to discuss pathology reports.   ## 08/08/2018 Liver mass biopsy showed fragments of necrotic and calcified tissue with adjacent fibrous capsule Scant viable nonneoplastic liver tissue with steatosis, inflammation and non specific fibrosis.  # 08/28/2018 CT guided left upper lobe lung mass biopsy showed poorly differentiated adenocarcinoma of lung origin.  6/19-08/16/2018  Finished palliative radiation to paraspinal soft tissue mass.   Hepatocellular carcinoma metastatic to left lung ([place])  07/24/2018 Initial Diagnosis   Hepatocellular carcinoma metastatic to left lung ([place])   04/16/2019 -  Chemotherapy   The patient had dexamethasone (DECADRON) 4 MG tablet, 8 mg, Oral, Daily, 1 of 1 cycle, Start date: 09/05/2019, End date: -- palonosetron (ALOXI) injection 0.25 mg, 0.25 mg, Intravenous,  Once, 6 of 6 cycles Administration: 0.25 mg  (04/16/2019), 0.25 mg (05/09/2019), 0.25 mg (05/30/2019), 0.25 mg (06/20/2019), 0.25 mg (08/15/2019), 0.25 mg (07/18/2019) CARBOplatin (PARAPLATIN) 470 mg in sodium chloride 0.9 % 250 mL chemo infusion, 470 mg (100 % of original dose 465.5 mg), Intravenous,  Once, 6 of 6 cycles Dose modification:   (original dose 465.5 mg, Cycle 1) Administration: 470 mg (04/16/2019), 470 mg (05/09/2019), 470 mg (05/30/2019), 470 mg (06/20/2019), 420 mg (08/15/2019), 420 mg (07/18/2019) fosaprepitant (EMEND) 150 mg in sodium chloride 0.9 % 145 mL IVPB, 150 mg, Intravenous,  Once, 6 of 6 cycles Administration: 150 mg (04/16/2019), 150 mg (05/09/2019), 150 mg (05/30/2019), 150 mg (06/20/2019), 150 mg (08/15/2019), 150 mg (07/18/2019) PACLitaxel (TAXOL) 354 mg in sodium chloride 0.9 % 500 mL chemo infusion (> $RemoveBef'80mg'uLWVoeXnuw$ /m2), 200 mg/m2 = 354 mg, Intravenous,  Once, 6 of 6 cycles Dose modification: 175 mg/m2 (original dose 200 mg/m2, Cycle 6, Reason: Provider Judgment), 135 mg/m2 (original dose 200 mg/m2, Cycle 6, Reason: Other (see comments), Comment: neuropathy) Administration: 354 mg (04/16/2019), 354 mg (05/09/2019), 354 mg (05/30/2019), 354 mg (06/20/2019), 240 mg (08/15/2019), 306 mg (07/18/2019) atezolizumab (TECENTRIQ) 1,200 mg in sodium chloride 0.9 % 250 mL chemo infusion, 1,200 mg, Intravenous, Once, 8 of 10 cycles Administration: 1,200 mg (05/09/2019), 1,200 mg (05/30/2019), 1,200 mg (06/20/2019), 1,200 mg (08/15/2019), 1,200 mg (09/05/2019), 1,200 mg (04/18/2019), 1,200  mg (07/18/2019), 1,200 mg (09/26/2019) bevacizumab-bvzr (ZIRABEV) 1,000 mg in sodium chloride 0.9 % 100 mL chemo infusion, 15 mg/kg = 1,000 mg, Intravenous,  Once, 8 of 10 cycles Administration: 1,000 mg (05/09/2019), 1,000 mg (05/30/2019), 1,000 mg (06/20/2019), 1,000 mg (08/15/2019), 1,000 mg (09/05/2019), 1,000 mg (04/18/2019), 1,000 mg (07/18/2019), 900 mg (09/26/2019)  for chemotherapy treatment.    Stage IV adenocarcinoma of lung, left ([place])  09/02/2018 Initial Diagnosis   Primary lung  adenocarcinoma, left ([place])   04/16/2019 -  Chemotherapy   The patient had dexamethasone (DECADRON) 4 MG tablet, 8 mg, Oral, Daily, 1 of 1 cycle, Start date: 09/05/2019, End date: -- palonosetron (ALOXI) injection 0.25 mg, 0.25 mg, Intravenous,  Once, 6 of 6 cycles Administration: 0.25 mg (04/16/2019), 0.25 mg (05/09/2019), 0.25 mg (05/30/2019), 0.25 mg (06/20/2019), 0.25 mg (08/15/2019), 0.25 mg (07/18/2019) CARBOplatin (PARAPLATIN) 470 mg in sodium chloride 0.9 % 250 mL chemo infusion, 470 mg (100 % of original dose 465.5 mg), Intravenous,  Once, 6 of 6 cycles Dose modification:   (original dose 465.5 mg, Cycle 1) Administration: 470 mg (04/16/2019), 470 mg (05/09/2019), 470 mg (05/30/2019), 470 mg (06/20/2019), 420 mg (08/15/2019), 420 mg (07/18/2019) fosaprepitant (EMEND) 150 mg in sodium chloride 0.9 % 145 mL IVPB, 150 mg, Intravenous,  Once, 6 of 6 cycles Administration: 150 mg (04/16/2019), 150 mg (05/09/2019), 150 mg (05/30/2019), 150 mg (06/20/2019), 150 mg (08/15/2019), 150 mg (07/18/2019) PACLitaxel (TAXOL) 354 mg in sodium chloride 0.9 % 500 mL chemo infusion (> $RemoveBef'80mg'HeEeivbctS$ /m2), 200 mg/m2 = 354 mg, Intravenous,  Once, 6 of 6 cycles Dose modification: 175 mg/m2 (original dose 200 mg/m2, Cycle 6, Reason: Provider Judgment), 135 mg/m2 (original dose 200 mg/m2, Cycle 6, Reason: Other (see comments), Comment: neuropathy) Administration: 354 mg (04/16/2019), 354 mg (05/09/2019), 354 mg (05/30/2019), 354 mg (06/20/2019), 240 mg (08/15/2019), 306 mg (07/18/2019) atezolizumab (TECENTRIQ) 1,200 mg in sodium chloride 0.9 % 250 mL chemo infusion, 1,200 mg, Intravenous, Once, 8 of 10 cycles Administration: 1,200 mg (05/09/2019), 1,200 mg (05/30/2019), 1,200 mg (06/20/2019), 1,200 mg (08/15/2019), 1,200 mg (09/05/2019), 1,200 mg (04/18/2019), 1,200 mg (07/18/2019), 1,200 mg (09/26/2019) bevacizumab-bvzr (ZIRABEV) 1,000 mg in sodium chloride 0.9 % 100 mL chemo infusion, 15 mg/kg = 1,000 mg, Intravenous,  Once, 8 of 10 cycles Administration: 1,000 mg  (05/09/2019), 1,000 mg (05/30/2019), 1,000 mg (06/20/2019), 1,000 mg (08/15/2019), 1,000 mg (09/05/2019), 1,000 mg (04/18/2019), 1,000 mg (07/18/2019), 900 mg (09/26/2019)  for chemotherapy treatment.    Cancer, metastatic to bone ([place])  12/16/2018 Initial Diagnosis   Cancer, metastatic to bone ([place])   04/16/2019 -  Chemotherapy   The patient had dexamethasone (DECADRON) 4 MG tablet, 8 mg, Oral, Daily, 1 of 1 cycle, Start date: 09/05/2019, End date: -- palonosetron (ALOXI) injection 0.25 mg, 0.25 mg, Intravenous,  Once, 6 of 6 cycles Administration: 0.25 mg (04/16/2019), 0.25 mg (05/09/2019), 0.25 mg (05/30/2019), 0.25 mg (06/20/2019), 0.25 mg (08/15/2019), 0.25 mg (07/18/2019) CARBOplatin (PARAPLATIN) 470 mg in sodium chloride 0.9 % 250 mL chemo infusion, 470 mg (100 % of original dose 465.5 mg), Intravenous,  Once, 6 of 6 cycles Dose modification:   (original dose 465.5 mg, Cycle 1) Administration: 470 mg (04/16/2019), 470 mg (05/09/2019), 470 mg (05/30/2019), 470 mg (06/20/2019), 420 mg (08/15/2019), 420 mg (07/18/2019) fosaprepitant (EMEND) 150 mg in sodium chloride 0.9 % 145 mL IVPB, 150 mg, Intravenous,  Once, 6 of 6 cycles Administration: 150 mg (04/16/2019), 150 mg (05/09/2019), 150 mg (05/30/2019), 150 mg (06/20/2019), 150 mg (08/15/2019), 150 mg (07/18/2019) PACLitaxel (TAXOL) 354 mg  in sodium chloride 0.9 % 500 mL chemo infusion (> $RemoveBef'80mg'NztJsMxQIL$ /m2), 200 mg/m2 = 354 mg, Intravenous,  Once, 6 of 6 cycles Dose modification: 175 mg/m2 (original dose 200 mg/m2, Cycle 6, Reason: Provider Judgment), 135 mg/m2 (original dose 200 mg/m2, Cycle 6, Reason: Other (see comments), Comment: neuropathy) Administration: 354 mg (04/16/2019), 354 mg (05/09/2019), 354 mg (05/30/2019), 354 mg (06/20/2019), 240 mg (08/15/2019), 306 mg (07/18/2019) atezolizumab (TECENTRIQ) 1,200 mg in sodium chloride 0.9 % 250 mL chemo infusion, 1,200 mg, Intravenous, Once, 8 of 10 cycles Administration: 1,200 mg (05/09/2019), 1,200 mg (05/30/2019), 1,200 mg (06/20/2019), 1,200 mg  (08/15/2019), 1,200 mg (09/05/2019), 1,200 mg (04/18/2019), 1,200 mg (07/18/2019), 1,200 mg (09/26/2019) bevacizumab-bvzr (ZIRABEV) 1,000 mg in sodium chloride 0.9 % 100 mL chemo infusion, 15 mg/kg = 1,000 mg, Intravenous,  Once, 8 of 10 cycles Administration: 1,000 mg (05/09/2019), 1,000 mg (05/30/2019), 1,000 mg (06/20/2019), 1,000 mg (08/15/2019), 1,000 mg (09/05/2019), 1,000 mg (04/18/2019), 1,000 mg (07/18/2019), 900 mg (09/26/2019)  for chemotherapy treatment.      ALLERGIES:  is allergic to 5-alpha reductase inhibitors.  MEDICATIONS:  Current Outpatient Medications  Medication Sig Dispense Refill  . dexamethasone (DECADRON) 4 MG tablet Take 2 tablets (8 mg total) by mouth daily. Start the day after carboplatin chemotherapy for 3 days. (Patient not taking: Reported on 10/26/2019) 16 tablet 1  . fentaNYL (DURAGESIC) 25 MCG/HR Place 1 patch onto the skin every 3 (three) days. 10 patch 0  . gabapentin (NEURONTIN) 300 MG capsule Take 1 capsule (300 mg total) by mouth 4 (four) times daily. 120 capsule 0  . hydrochlorothiazide (HYDRODIURIL) 25 MG tablet Take 1 tablet (25 mg total) by mouth daily. (Patient not taking: Reported on 10/26/2019) 30 tablet 12  . lidocaine-prilocaine (EMLA) cream Apply to affected area once (Patient taking differently: Apply 1 application topically daily as needed (port acess). ) 30 g 3  . loperamide (IMODIUM) 2 MG capsule Take 1 capsule (2 mg total) by mouth See admin instructions. With onset of loose stool, take $RemoveBef'4mg'QGoxYhjJOb$  followed by $RemoveBef'2mg'NnWIQBjGIo$  every 2 hours until 12 hours have passed without loose bowel movement. Maximum: 16 mg/day (Patient not taking: Reported on 10/26/2019) 60 capsule 1  . magnesium chloride (SLOW-MAG) 64 MG TBEC SR tablet Take 1 tablet (64 mg total) by mouth daily. 30 tablet 0  . megestrol (MEGACE) 40 MG/ML suspension Take 10 mLs (400 mg total) by mouth daily. 240 mL 0  . Multiple Vitamin (MULTIVITAMIN) capsule Take 1 capsule by mouth daily.    Marland Kitchen NARCAN 4 MG/0.1ML LIQD nasal  spray kit 1 spray once.  (Patient not taking: Reported on 09/25/2019)    . ondansetron (ZOFRAN) 8 MG tablet Take 1 tablet (8 mg total) by mouth 2 (two) times daily as needed for refractory nausea / vomiting. Start on day 3 after carboplatin chemo. (Patient not taking: Reported on 10/26/2019) 30 tablet 1  . oxyCODONE (ROXICODONE) 5 MG immediate release tablet Take 1 tablet (5 mg total) by mouth every 8 (eight) hours as needed for severe pain or breakthrough pain. 60 tablet 0  . polyethylene glycol (MIRALAX / GLYCOLAX) 17 g packet Take 17 g by mouth daily. (Patient not taking: Reported on 10/26/2019) 14 each 0  . potassium chloride (KLOR-CON) 10 MEQ tablet Take 1 tablet (10 mEq total) by mouth daily. 60 tablet 0  . prochlorperazine (COMPAZINE) 10 MG tablet Take 1 tablet (10 mg total) by mouth every 6 (six) hours as needed (Nausea or vomiting). (Patient not taking: Reported on 10/26/2019) 30 tablet  1  . senna (SENOKOT) 8.6 MG TABS tablet Take 1 tablet (8.6 mg total) by mouth daily. (Patient not taking: Reported on 10/26/2019) 120 tablet 0   No current facility-administered medications for this visit.    VITAL SIGNS: There were no vitals taken for this visit. There were no vitals filed for this visit.  Estimated body mass index is 20.82 kg/m as calculated from the following:   Height as of 09/18/19: $RemoveBe'5\' 5"'XCWQQOVeC$  (1.651 m).   Weight as of an earlier encounter on 10/26/19: 125 lb 1.6 oz (56.7 kg).  LABS: CBC:    Component Value Date/Time   WBC 8.5 10/17/2019 0819   HGB 14.2 10/17/2019 0819   HGB 14.6 06/23/2018 1100   HCT 40.5 10/17/2019 0819   HCT 43.2 06/23/2018 1100   PLT 293 10/17/2019 0819   PLT 297 06/23/2018 1100   MCV 84.0 10/17/2019 0819   MCV 96 06/23/2018 1100   MCV 88 09/14/2013 0511   NEUTROABS 6.4 10/17/2019 0819   NEUTROABS 2.8 06/23/2018 1100   NEUTROABS 12.4 (H) 09/14/2013 0511   LYMPHSABS 1.1 10/17/2019 0819   LYMPHSABS 1.8 06/23/2018 1100   LYMPHSABS 2.3 09/14/2013 0511   MONOABS  1.0 10/17/2019 0819   MONOABS 1.1 (H) 09/14/2013 0511   EOSABS 0.0 10/17/2019 0819   EOSABS 0.1 06/23/2018 1100   EOSABS 0.2 09/14/2013 0511   BASOSABS 0.0 10/17/2019 0819   BASOSABS 0.0 06/23/2018 1100   BASOSABS 0.0 09/14/2013 0511   Comprehensive Metabolic Panel:    Component Value Date/Time   NA 130 (L) 10/19/2019 0935   NA 136 06/23/2018 1100   NA 139 09/13/2013 0408   K 3.0 (L) 10/19/2019 0935   K 3.3 (L) 09/13/2013 0408   CL 91 (L) 10/19/2019 0935   CL 105 09/13/2013 0408   CO2 25 10/19/2019 0935   CO2 27 09/13/2013 0408   BUN 10 10/19/2019 0935   BUN 6 (L) 06/23/2018 1100   BUN 4 (L) 09/13/2013 0408   CREATININE 0.72 10/19/2019 0935   CREATININE 0.67 09/13/2013 0408   GLUCOSE 168 (H) 10/19/2019 0935   GLUCOSE 108 (H) 09/13/2013 0408   CALCIUM 8.8 (L) 10/19/2019 0935   CALCIUM 8.1 (L) 09/13/2013 0408   AST 37 10/19/2019 0935   AST 30 09/13/2013 0408   ALT 34 10/19/2019 0935   ALT 27 09/13/2013 0408   ALKPHOS 61 10/19/2019 0935   ALKPHOS 218 (H) 09/13/2013 0408   BILITOT 0.7 10/19/2019 0935   BILITOT 0.5 06/23/2018 1100   BILITOT 0.9 09/13/2013 0408   PROT 7.0 10/19/2019 0935   PROT 7.4 06/23/2018 1100   PROT 6.2 (L) 09/13/2013 0408   ALBUMIN 3.1 (L) 10/19/2019 0935   ALBUMIN 4.0 06/23/2018 1100   ALBUMIN 1.9 (L) 09/13/2013 0408    RADIOGRAPHIC STUDIES: CT CHEST ABDOMEN PELVIS W CONTRAST  Result Date: 10/24/2019 CLINICAL DATA:  Left lung cancer restaging. Worsening shortness of breath on exertion. EXAM: CT CHEST, ABDOMEN, AND PELVIS WITH CONTRAST TECHNIQUE: Multidetector CT imaging of the chest, abdomen and pelvis was performed following the standard protocol during bolus administration of intravenous contrast. CONTRAST:  17mL OMNIPAQUE IOHEXOL 300 MG/ML  SOLN COMPARISON:  Multiple exams, including CT examinations from 07/04/2019 and 07/24/2019 FINDINGS: CT CHEST FINDINGS Cardiovascular: Ascending thoracic aortic aneurysm 4.1 cm in diameter on image 32/2,  unchanged. Considerable collateralization from the left upper extremity venous injection possibly due to stenosis in the subclavian vein or of lower SVC, resulting in an unusual contrast bolus with considerable  pulmonary arterial contrast greater than the systemic arterial contrast. Atherosclerotic calcification of the aortic arch and coronary arteries. Mediastinum/Nodes: Unremarkable Lungs/Pleura: Severe emphysema. Increased bandlike density and volume loss in the left upper lobe. Increased wall thickening associated with a superior segment left lower lobe bulla, previously with wall thickness of only about 0.2 cm, currently up to 0.8 cm on image 43/3. Infected bulla not excluded although this could be from radiation therapy. Airway thickening is present, suggesting bronchitis or reactive airways disease. Musculoskeletal: Thoracic spondylosis. No recurrent paraspinal mass. Stable lucency with thin rim sclerosis in the T11 vertebra, indicating site of prior bony erosion from the paraspinal mass. CT ABDOMEN PELVIS FINDINGS Hepatobiliary: The phase of contrast is early based on the contrast timing issues noted in the chest section. We did cover the entire liver on the delayed images as well. Dependent density in the gallbladder favoring gallstones. Stable 2.9 by 2.9 cm hypodense lesion in the right hepatic lobe with faint marginal and internal septations and calcification, probably a previously treated metastatic lesion. A subtle hypodense lesion further inferiorly in the right hepatic lobe measuring 0.8 cm in diameter on image 17/9 previously measured about 1.9 cm by my measurements, and is accordingly reduced in size. No new liver lesion.  No biliary dilatation is identified. Pancreas: Unremarkable Spleen: Unremarkable Adrenals/Urinary Tract: Stable band of hypoenhancement medially in the right kidney upper pole as on image 11/9. If the patient has had prior radiation therapy in the region, that may be the cause for  this chronic appearance. Right mid kidney hypodense lesions are again observed, the larger lesion is thought to be complex but sharply defined and otherwise nonspecific. Left kidney appears normal. Adrenal glands unremarkable. Stomach/Bowel: Postoperative findings in the sigmoid colon. Vascular/Lymphatic: Aortoiliac atherosclerotic vascular disease. Porta hepatis/peripancreatic node measures is 0.9 cm in short axis on image 63/2, stable. No pathologic adenopathy is identified. Reproductive: Speckled calcifications along the penile tunica albuginea. Other: No supplemental non-categorized findings. Musculoskeletal: Spurring of both sacroiliac joints. Grade 1 degenerative anterolisthesis at L5-S1. Lumbar spondylosis and degenerative disc disease causing multilevel impingement. IMPRESSION: 1. Reduced size and conspicuity of the lower of the 2 liver lesions, suggesting improvement of this lesion. The other lesion is stable. 2. There is increase in volume loss and bandlike density in the left upper lobe, along with increased wall thickening of an adjacent superior segment left lower lobe bulla. These findings could be inflammatory or from radiation therapy, strictly speaking, underlying progressive malignancy is difficult to exclude. 3. Stable lucency with thin rim sclerosis in the T11 vertebra, indicating site of prior bony invasion from the paraspinal mass. No recurrent paraspinal mass is present. 4. Other imaging findings of potential clinical significance: Coronary atherosclerosis. Airway thickening is present, suggesting bronchitis or reactive airways disease. Stable 4.1 cm ascending thoracic aortic aneurysm. Cholelithiasis. Stable band of hypoenhancement medially in the right kidney upper pole, probably from prior radiation therapy. Lumbar spondylosis and degenerative disc disease causing multilevel impingement. 5. Emphysema and aortic atherosclerosis. Aortic Atherosclerosis (ICD10-I70.0) and Emphysema  (ICD10-J43.9). Electronically Signed   By: Van Clines M.D.   On: 10/24/2019 14:36    PERFORMANCE STATUS (ECOG) : 1 - Symptomatic but completely ambulatory  Review of Systems Unless otherwise noted, a complete review of systems is negative.  Physical Exam General: NAD Pulmonary: unlabored Extremities: no edema, no joint deformities Skin: no rashes Neurological: Weakness but otherwise nonfocal  IMPRESSION: Routine follow-up visit.    Patient reports he is doing about the same.  No  significant changes.  He continues to have fatigue and poor oral intake.  He is drinking oral supplements.  He has had weight loss but less then he was having previously over the past weeks.  Most recently, weight is down 1 pound.  It is unclear if patient is taking the Megace as was previously prescribed.  I again referred patient to home palliative care for care coordination and support.  Would consider home health services if needed.  I discussed CODE STATUS with patient today.  He states that he would not want to be resuscitated or have his life prolonged artificially machines.  He says that if it is time he wants to just go in peace.  He was in agreement of DNR/DNI.  I completed a DNR order for him to take home today.  Patient states he is not interested in completing a MOST form at the present time.  PLAN: -Continue current scope of treatment -Refill oxycodone 5 mg every 8 hours as needed -Increase Megace 400 mg daily -DNR/DNI -MyChart visit 1 to 2 months   Patient expressed understanding and was in agreement with this plan. He also understands that He can call the clinic at any time with any questions, concerns, or complaints.     Time Total: 20 minutes  Visit consisted of counseling and education dealing with the complex and emotionally intense issues of symptom management and palliative care in the setting of serious and potentially life-threatening illness.Greater than 50%  of this time  was spent counseling and coordinating care related to the above assessment and plan.  Signed by: Altha Harm, PhD, NP-C

## 2019-10-26 NOTE — Progress Notes (Signed)
Hematology/Oncology follow up note [facility] [phone] [phone]  Reassurance Patient Care Team: Gilbert, Richard L Jr., MD as PCP - General (Family Medicine) Agbor-Etang, Brian, MD as PCP - Cardiology (Cardiology) Rhode, Hayley, RN as Registered Nurse Stanton, Kristi D, RN as Registered Nurse  REFERRING PROVIDER: Gilbert, Richard L Jr.,*  CHIEF COMPLAINTS/REASON FOR VISIT:  metastatic cancer management.  HISTORY OF PRESENTING ILLNESS:   Samuel D Tarbell Jr. is a  69 y.o.  male with PMH listed below was seen in consultation at the request of  Gilbert, Richard L Jr.,*  for evaluation of lung nodule in the liver lesion. Patient has a past medical history of hypertension, alcohol abuse, smoking emphysema.  He has had serial CTs done in the past.  CT images were independently reviewed by me. 07/26/2017 CT chest with contrast showed extensive pleural-parenchymal scarring within the left upper lobe with several areas of soft tissue nodularity measuring up to 1.6 cm.  In this patient who is at increased risk of lung cancer further investigation with PET scan is recommended. Small chronic appearance loculated hydropneumothorax overlies the left apex. Slowly enlarging lesion within the central portion of the right lobe of liver identified. Per note, patient supposed to have additional work-up done in December repeat CT scan which he did not  Patient was recently seen by primary care provider and has subsequent image done for follow-up. CT on 06/30/2018 showed multiple significantly enlarged paraspinal mass are noted concerning.  The largest measures 3.8 x 0.5 cm and there appears to be a lytic destruction of the adjacent T11 vertebral body.  Also noted is new solid density measuring 17 x 12 mm in the pleural parenchymal density in the left upper lobe noted on prior exam.  Concerning for malignancy.  4.4 ascending thoracic aortic aneurysm.  Recommend annual  imaging.  Patient had PET scan done on 07/06/2018 Which showed there are 2 FDG avid lesion within the left upper lobe worrisome for primary lung neoplasm.  There is evidence of chest wall involvement.  Metastasis to the posterior mediastinum is identified with extension into T11 vertebra and possible involvement of the left T12 neural foramina.  Patient reports left shoulder pain.  Smoking half a pack a day not motivated with further questioning quitting Denies weight loss, hemoptysis, cough.  Chronic shortness of breath with exertion. He drinks alcohol daily. Lives with his girlfriend.  He has 5 adult kids  #Hepatitis C  # 6/1?2020  CT-guided biopsy of left-sided posterior mediastinal soft tissue adjacent to T10/11. Patient's case was discussed on tumor board. Consensus was reached that HCC versus primary lung cancer with hepatoid adenocarcinoma features. Consensus was to proceed with liver biopsy first as liver mass was not FDG avid on PET scan.  Patient underwent liver biopsy and the present to discuss pathology reports.  ## 08/08/2018 Liver mass biopsy showed fragments of necrotic and calcified tissue with adjacent fibrous capsule Scant viable nonneoplastic liver tissue with steatosis, inflammation and non specific fibrosis.  # 08/28/2018 CT guided left upper lobe lung mass biopsy showed poorly differentiated adenocarcinoma of lung origin.  6/19-08/16/2018  Finished palliative radiation to paraspinal soft tissue mass. 09/07/2018 patient was started on lenvatinib 12 mg daily. 10/13/2018 SBRT to lung cancer lesion.  #Patient is on Zometa monthly #He was advised to take calcium supplement.  He reports he quit taking calcium as he broke out rash on her scalp, neck and chest after taking calcium. # 03/30/19 liver lesion biopsy showed poorly   differentiated adenocarcinoma, compatible with lung primary.  INTERVAL HISTORY Samuel Hood. is a 69 y.o. male who has above history reviewed by me today  presents for evaluation prior to chemotherapy for lung cancer and HCC.  #Patient continues to feel tired and fatigued.  Appetite is decreased. He denies any pain today.   Continues to have shortness of breath with exertion. Patient reports taking Megace 400 mg daily. He takes fentanyl patch and oxycodone and gabapentin for bilateral lower extremity neuropathy pain.  Symptoms are getting worse.  Review of Systems  Constitutional: Positive for fatigue and unexpected weight change. Negative for appetite change, chills and fever.  HENT:   Negative for hearing loss and voice change.   Eyes: Negative for eye problems and icterus.  Respiratory: Positive for shortness of breath. Negative for chest tightness and cough.   Cardiovascular: Negative for chest pain and leg swelling.  Gastrointestinal: Negative for abdominal distention, abdominal pain and constipation.  Endocrine: Negative for hot flashes.  Genitourinary: Negative for difficulty urinating, dysuria and frequency.   Musculoskeletal: Negative for arthralgias.  Skin: Negative for itching and rash.  Neurological: Positive for numbness. Negative for light-headedness.  Hematological: Negative for adenopathy. Does not bruise/bleed easily.  Psychiatric/Behavioral: Negative for confusion.    MEDICAL HISTORY:  Past Medical History:  Diagnosis Date  . Hepatocellular carcinoma metastatic to left lung ([place]) 07/24/2018  . History of angiography    left lower extremity  . Hypertension   . Small bowel obstruction ([place])     SURGICAL HISTORY: Past Surgical History:  Procedure Laterality Date  . COLON SURGERY    . COLONOSCOPY W/ POLYPECTOMY    . COLONOSCOPY WITH PROPOFOL N/A 01/31/2018   Procedure: COLONOSCOPY WITH PROPOFOL;  Surgeon: Toledo, Benay Pike, MD;  Location: [facility] ENDOSCOPY;  Service: Gastroenterology;  Laterality: N/A;  . debridement fasciotomy leg, left Left 03/19/2016  . LAPAROTOMY N/A 09/27/2014   Procedure: EXPLORATORY LAPAROTOMY;   Surgeon: Dia Crawford III, MD;  Location: [facility] ORS;  Service: General;  Laterality: N/A;  . PORTA CATH INSERTION N/A 04/10/2019   Procedure: PORTA CATH INSERTION;  Surgeon: Katha Cabal, MD;  Location: [facility];  Service: Cardiovascular;  Laterality: N/A;    SOCIAL HISTORY: Social History   Socioeconomic History  . Marital status: Single    Spouse name: Not on file  . Number of children: Not on file  . Years of education: Not on file  . Highest education level: Not on file  Occupational History  . Occupation: retired  Tobacco Use  . Smoking status: Current Every Day Smoker    Packs/day: 0.25    Years: 50.00    Pack years: 12.50  . Smokeless tobacco: Never Used  . Tobacco comment: 3 cigarettes per day  Vaping Use  . Vaping Use: Never used  Substance and Sexual Activity  . Alcohol use: Not Currently    Alcohol/week: 35.0 standard drinks    Types: 30 Cans of beer, 5 Shots of liquor per week  . Drug use: No  . Sexual activity: Not on file  Other Topics Concern  . Not on file  Social History Narrative  . Not on file   Social Determinants of Health   Financial Resource Strain:   . Difficulty of Paying Living Expenses: Not on file  Food Insecurity:   . Worried About Charity fundraiser in the Last Year: Not on file  . Ran Out of Food in the Last Year: Not on file  Transportation Needs:   . Film/video editor (Medical): Not on file  . Lack of Transportation (Non-Medical): Not on file  Physical Activity:   . Days of Exercise per Week: Not on file  . Minutes of Exercise per Session: Not on file  Stress:   . Feeling of Stress : Not on file  Social Connections:   . Frequency of Communication with Friends and Family: Not on file  . Frequency of Social Gatherings with Friends and Family: Not on file  . Attends Religious Services: Not on file  . Active Member of Clubs or Organizations: Not on file  . Attends Archivist Meetings: Not on file  .  Marital Status: Not on file  Intimate Partner Violence:   . Fear of Current or Ex-Partner: Not on file  . Emotionally Abused: Not on file  . Physically Abused: Not on file  . Sexually Abused: Not on file    FAMILY HISTORY: Family History  Problem Relation Age of Onset  . Cancer Mother   . Cancer Father     ALLERGIES:  is allergic to 5-alpha reductase inhibitors.  MEDICATIONS:  Current Outpatient Medications  Medication Sig Dispense Refill  . fentaNYL (DURAGESIC) 25 MCG/HR Place 1 patch onto the skin every 3 (three) days. 10 patch 0  . gabapentin (NEURONTIN) 300 MG capsule Take 2 capsules (600 mg total) by mouth 3 (three) times daily. 120 capsule 0  . lidocaine-prilocaine (EMLA) cream Apply to affected area once (Patient taking differently: Apply 1 application topically daily as needed (port acess). ) 30 g 3  . magnesium chloride (SLOW-MAG) 64 MG TBEC SR tablet Take 1 tablet (64 mg total) by mouth daily. 30 tablet 0  . megestrol (MEGACE) 40 MG/ML suspension Take 10 mLs (400 mg total) by mouth daily. 240 mL 0  . Multiple Vitamin (MULTIVITAMIN) capsule Take 1 capsule by mouth daily.    Marland Kitchen oxyCODONE (ROXICODONE) 5 MG immediate release tablet Take 1 tablet (5 mg total) by mouth every 8 (eight) hours as needed for severe pain or breakthrough pain. 60 tablet 0  . potassium chloride (KLOR-CON) 10 MEQ tablet Take 1 tablet (10 mEq total) by mouth daily. 60 tablet 0  . aspirin EC 81 MG tablet Take 1 tablet (81 mg total) by mouth daily. Swallow whole. 90 tablet 1  . dexamethasone (DECADRON) 4 MG tablet Take 1 tablet (4 mg total) by mouth daily. 10 tablet 0  . hydrochlorothiazide (HYDRODIURIL) 25 MG tablet Take 1 tablet (25 mg total) by mouth daily. (Patient not taking: Reported on 10/26/2019) 30 tablet 12  . loperamide (IMODIUM) 2 MG capsule Take 1 capsule (2 mg total) by mouth See admin instructions. With onset of loose stool, take 67m followed by 220mevery 2 hours until 12 hours have passed  without loose bowel movement. Maximum: 16 mg/day (Patient not taking: Reported on 10/26/2019) 60 capsule 1  . NARCAN 4 MG/0.1ML LIQD nasal spray kit 1 spray once.  (Patient not taking: Reported on 09/25/2019)    . ondansetron (ZOFRAN) 8 MG tablet Take 1 tablet (8 mg total) by mouth 2 (two) times daily as needed for refractory nausea / vomiting. Start on day 3 after carboplatin chemo. (Patient not taking: Reported on 10/26/2019) 30 tablet 1  . polyethylene glycol (MIRALAX / GLYCOLAX) 17 g packet Take 17 g by mouth daily. (Patient not taking: Reported on 10/26/2019) 14 each 0  . prochlorperazine (COMPAZINE) 10 MG tablet Take 1 tablet (10 mg total) by mouth every  6 (six) hours as needed (Nausea or vomiting). (Patient not taking: Reported on 10/26/2019) 30 tablet 1  . senna (SENOKOT) 8.6 MG TABS tablet Take 1 tablet (8.6 mg total) by mouth daily. (Patient not taking: Reported on 10/26/2019) 120 tablet 0   No current facility-administered medications for this visit.     PHYSICAL EXAMINATION: ECOG PERFORMANCE STATUS: 1 - Symptomatic but completely ambulatory Vitals:   10/26/19 1054  BP: 95/67  Pulse: 100  Resp: 18  Temp: 98.3 F (36.8 C)  SpO2: 98%   Filed Weights   10/26/19 1054  Weight: 125 lb 1.6 oz (56.7 kg)    Physical Exam Constitutional:      General: He is not in acute distress.    Appearance: He is not ill-appearing.  HENT:     Head: Normocephalic and atraumatic.  Eyes:     General: No scleral icterus. Cardiovascular:     Rate and Rhythm: Normal rate and regular rhythm.     Heart sounds: Normal heart sounds.  Pulmonary:     Effort: Pulmonary effort is normal. No respiratory distress.     Breath sounds: No wheezing or rales.     Comments: Severely decreased bilateral breath sound. Abdominal:     General: Bowel sounds are normal. There is no distension.     Palpations: Abdomen is soft.  Musculoskeletal:        General: No deformity. Normal range of motion.     Cervical back:  Normal range of motion and neck supple.  Skin:    General: Skin is warm and dry.     Findings: No erythema or rash.  Neurological:     Mental Status: He is alert and oriented to person, place, and time. Mental status is at baseline.     Cranial Nerves: No cranial nerve deficit.     Coordination: Coordination normal.  Psychiatric:        Mood and Affect: Mood normal.     LABORATORY DATA:  I have reviewed the data as listed Lab Results  Component Value Date   WBC 8.5 10/17/2019   HGB 14.2 10/17/2019   HCT 40.5 10/17/2019   MCV 84.0 10/17/2019   PLT 293 10/17/2019   Recent Labs    09/26/19 0807 09/26/19 0807 10/17/19 0819 10/19/19 0935 10/26/19 1238  NA 139   < > 129* 130* 133*  K 3.3*   < > 3.0* 3.0* 3.2*  CL 100   < > 90* 91* 95*  CO2 27   < > _0 GLUCOSE 140*   < > 132* 168* 99  BUN 17   < > _1 CREATININE 0.77   < > 0.75 0.72 0.61  CALCIUM 9.3   < > 8.5* 8.8* 8.7*  GFRNONAA >60   < > >60 >60 >60  GFRAA >60   < > >60 >60 >60  PROT 7.0  --  7.3 7.0  --   ALBUMIN 3.4*  --  3.2* 3.1*  --   AST 58*  --  48* 37  --   ALT 50*  --  43 34  --   ALKPHOS 78  --  63 61  --   BILITOT 0.4  --  1.0 0.7  --    < > = values in this interval not displayed.   Iron/TIBC/Ferritin/ %Sat No results found for: IRON, TIBC, FERRITIN, IRONPCTSAT   RADIOGRAPHIC STUDIES: I have personally reviewed the radiological images as listed and agreed  with the findings in the report. CT CHEST ABDOMEN PELVIS W CONTRAST  Result Date: 10/24/2019 CLINICAL DATA:  Left lung cancer restaging. Worsening shortness of breath on exertion. EXAM: CT CHEST, ABDOMEN, AND PELVIS WITH CONTRAST TECHNIQUE: Multidetector CT imaging of the chest, abdomen and pelvis was performed following the standard protocol during bolus administration of intravenous contrast. CONTRAST:  29m OMNIPAQUE IOHEXOL 300 MG/ML  SOLN COMPARISON:  Multiple exams, including CT examinations from 07/04/2019 and 07/24/2019 FINDINGS: CT  CHEST FINDINGS Cardiovascular: Ascending thoracic aortic aneurysm 4.1 cm in diameter on image 32/2, unchanged. Considerable collateralization from the left upper extremity venous injection possibly due to stenosis in the subclavian vein or of lower SVC, resulting in an unusual contrast bolus with considerable pulmonary arterial contrast greater than the systemic arterial contrast. Atherosclerotic calcification of the aortic arch and coronary arteries. Mediastinum/Nodes: Unremarkable Lungs/Pleura: Severe emphysema. Increased bandlike density and volume loss in the left upper lobe. Increased wall thickening associated with a superior segment left lower lobe bulla, previously with wall thickness of only about 0.2 cm, currently up to 0.8 cm on image 43/3. Infected bulla not excluded although this could be from radiation therapy. Airway thickening is present, suggesting bronchitis or reactive airways disease. Musculoskeletal: Thoracic spondylosis. No recurrent paraspinal mass. Stable lucency with thin rim sclerosis in the T11 vertebra, indicating site of prior bony erosion from the paraspinal mass. CT ABDOMEN PELVIS FINDINGS Hepatobiliary: The phase of contrast is early based on the contrast timing issues noted in the chest section. We did cover the entire liver on the delayed images as well. Dependent density in the gallbladder favoring gallstones. Stable 2.9 by 2.9 cm hypodense lesion in the right hepatic lobe with faint marginal and internal septations and calcification, probably a previously treated metastatic lesion. A subtle hypodense lesion further inferiorly in the right hepatic lobe measuring 0.8 cm in diameter on image 17/9 previously measured about 1.9 cm by my measurements, and is accordingly reduced in size. No new liver lesion.  No biliary dilatation is identified. Pancreas: Unremarkable Spleen: Unremarkable Adrenals/Urinary Tract: Stable band of hypoenhancement medially in the right kidney upper pole as on  image 11/9. If the patient has had prior radiation therapy in the region, that may be the cause for this chronic appearance. Right mid kidney hypodense lesions are again observed, the larger lesion is thought to be complex but sharply defined and otherwise nonspecific. Left kidney appears normal. Adrenal glands unremarkable. Stomach/Bowel: Postoperative findings in the sigmoid colon. Vascular/Lymphatic: Aortoiliac atherosclerotic vascular disease. Porta hepatis/peripancreatic node measures is 0.9 cm in short axis on image 63/2, stable. No pathologic adenopathy is identified. Reproductive: Speckled calcifications along the penile tunica albuginea. Other: No supplemental non-categorized findings. Musculoskeletal: Spurring of both sacroiliac joints. Grade 1 degenerative anterolisthesis at L5-S1. Lumbar spondylosis and degenerative disc disease causing multilevel impingement. IMPRESSION: 1. Reduced size and conspicuity of the lower of the 2 liver lesions, suggesting improvement of this lesion. The other lesion is stable. 2. There is increase in volume loss and bandlike density in the left upper lobe, along with increased wall thickening of an adjacent superior segment left lower lobe bulla. These findings could be inflammatory or from radiation therapy, strictly speaking, underlying progressive malignancy is difficult to exclude. 3. Stable lucency with thin rim sclerosis in the T11 vertebra, indicating site of prior bony invasion from the paraspinal mass. No recurrent paraspinal mass is present. 4. Other imaging findings of potential clinical significance: Coronary atherosclerosis. Airway thickening is present, suggesting bronchitis or reactive airways  disease. Stable 4.1 cm ascending thoracic aortic aneurysm. Cholelithiasis. Stable band of hypoenhancement medially in the right kidney upper pole, probably from prior radiation therapy. Lumbar spondylosis and degenerative disc disease causing multilevel impingement. 5.  Emphysema and aortic atherosclerosis. Aortic Atherosclerosis (ICD10-I70.0) and Emphysema (ICD10-J43.9). Electronically Signed   By: Van Clines M.D.   On: 10/24/2019 14:36   US Venous Img Lower Bilateral (DVT)  Result Date: 08/02/2019 CLINICAL DATA:  Bilateral lower extremity pain and edema for the past 2 weeks. History of smoking. History of lung cancer. Evaluate for DVT. EXAM: BILATERAL LOWER EXTREMITY VENOUS DOPPLER ULTRASOUND TECHNIQUE: Gray-scale sonography with graded compression, as well as color Doppler and duplex ultrasound were performed to evaluate the lower extremity deep venous systems from the level of the common femoral vein and including the common femoral, femoral, profunda femoral, popliteal and calf veins including the posterior tibial, peroneal and gastrocnemius veins when visible. The superficial great saphenous vein was also interrogated. Spectral Doppler was utilized to evaluate flow at rest and with distal augmentation maneuvers in the common femoral, femoral and popliteal veins. COMPARISON:  None. FINDINGS: RIGHT LOWER EXTREMITY Common Femoral Vein: No evidence of thrombus. Normal compressibility, respiratory phasicity and response to augmentation. Saphenofemoral Junction: No evidence of thrombus. Normal compressibility and flow on color Doppler imaging. Profunda Femoral Vein: No evidence of thrombus. Normal compressibility and flow on color Doppler imaging. Femoral Vein: No evidence of thrombus. Normal compressibility, respiratory phasicity and response to augmentation. Popliteal Vein: No evidence of thrombus. Normal compressibility, respiratory phasicity and response to augmentation. Calf Veins: No evidence of thrombus. Normal compressibility and flow on color Doppler imaging. Superficial Great Saphenous Vein: No evidence of thrombus. Normal compressibility. Venous Reflux:  None. Other Findings:  None. LEFT LOWER EXTREMITY Common Femoral Vein: No evidence of thrombus. Normal  compressibility, respiratory phasicity and response to augmentation. Saphenofemoral Junction: No evidence of thrombus. Normal compressibility and flow on color Doppler imaging. Profunda Femoral Vein: No evidence of thrombus. Normal compressibility and flow on color Doppler imaging. Femoral Vein: No evidence of thrombus. Normal compressibility, respiratory phasicity and response to augmentation. Popliteal Vein: No evidence of thrombus. Normal compressibility, respiratory phasicity and response to augmentation. Calf Veins: No evidence of thrombus. Normal compressibility and flow on color Doppler imaging. Superficial Great Saphenous Vein: No evidence of thrombus. Normal compressibility. Venous Reflux:  None. Other Findings:  None. IMPRESSION: No evidence of DVT within either lower extremity. Electronically Signed   By: Sandi Mariscal M.D.   On: 08/02/2019 10:36   ECHOCARDIOGRAM COMPLETE  Result Date: 09/14/2019    ECHOCARDIOGRAM REPORT   Patient Name:   Samuel Hood. Date of Exam: 09/14/2019 Medical Rec #:  [id number]        Height:       65.0 in Accession #:    [id number]       Weight:       133.2 lb Date of Birth:  Aug 22, 1950         BSA:          1.664 m Patient Age:    42 years         BP:           132/90 mmHg Patient Gender: M                HR:           103 bpm. Exam Location:  [place] Procedure: 2D Echo, Cardiac Doppler and Color Doppler Indications:    R06.02 SOB  History:        Patient has no prior history of Echocardiogram examinations.                 Thoracic aortic aneurysm, COPD, Arrythmias:Tachycardia,                 Signs/Symptoms:Shortness of Breath; Risk Factors:Former Smoker                 and Hypertension.  Sonographer:    Pilar Jarvis RDMS, RVT, RDCS Referring Phys: [id number] BRIAN AGBOR-ETANG IMPRESSIONS  1. Left ventricular ejection fraction, by estimation, is 55%. The left ventricle has normal function. The left ventricle has no regional wall motion abnormalities. There is mild left  ventricular hypertrophy. Left ventricular diastolic parameters are consistent with Grade I diastolic dysfunction (impaired relaxation).  2. Right ventricular systolic function is normal. The right ventricular size is normal. There is normal pulmonary artery systolic pressure. The estimated right ventricular systolic pressure is 51.7 mmHg.  3. Tricuspid valve regurgitation is mild to moderate.  4. There is dilatation of the aortic root measuring 36 mm. Ascending aorta 4.0 cm FINDINGS  Left Ventricle: Left ventricular ejection fraction, by estimation, is 55 to 60%. The left ventricle has normal function. The left ventricle has no regional wall motion abnormalities. The left ventricular internal cavity size was normal in size. There is  mild left ventricular hypertrophy. Left ventricular diastolic parameters are consistent with Grade I diastolic dysfunction (impaired relaxation). Right Ventricle: The right ventricular size is normal. No increase in right ventricular wall thickness. Right ventricular systolic function is normal. There is normal pulmonary artery systolic pressure. The tricuspid regurgitant velocity is 2.32 m/s, and  with an assumed right atrial pressure of 10 mmHg, the estimated right ventricular systolic pressure is 00.1 mmHg. Left Atrium: Left atrial size was normal in size. Right Atrium: Right atrial size was normal in size. Pericardium: There is no evidence of pericardial effusion. Mitral Valve: The mitral valve is normal in structure. Normal mobility of the mitral valve leaflets. No evidence of mitral valve regurgitation. No evidence of mitral valve stenosis. Tricuspid Valve: The tricuspid valve is normal in structure. Tricuspid valve regurgitation is mild to moderate. No evidence of tricuspid stenosis. Aortic Valve: The aortic valve is normal in structure. Aortic valve regurgitation is not visualized. Mild to moderate aortic valve sclerosis/calcification is present, without any evidence of aortic  stenosis. Aortic valve mean gradient measures 2.0 mmHg. Aortic valve peak gradient measures 3.8 mmHg. Aortic valve area, by VTI measures 4.14 cm. Pulmonic Valve: The pulmonic valve was normal in structure. Pulmonic valve regurgitation is not visualized. No evidence of pulmonic stenosis. Aorta: The aortic root is normal in size and structure. There is dilatation of the aortic root measuring 36 mm. Venous: The inferior vena cava is normal in size with greater than 50% respiratory variability, suggesting right atrial pressure of 3 mmHg. IAS/Shunts: No atrial level shunt detected by color flow Doppler.  LEFT VENTRICLE PLAX 2D LVIDd:         2.90 cm     Diastology LVIDs:         2.00 cm     LV e' lateral:   11.10 cm/s LV PW:         0.80 cm     LV E/e' lateral: 4.4 LV IVS:        0.80 cm     LV e' medial:    6.74 cm/s LVOT diam:     2.00 cm  LV E/e' medial:  7.3 LV SV:         47 LV SV Index:   28 LVOT Area:     3.14 cm  LV Volumes (MOD) LV vol d, MOD A2C: 37.6 ml LV vol d, MOD A4C: 34.6 ml LV vol s, MOD A2C: 20.7 ml LV vol s, MOD A4C: 19.0 ml LV SV MOD A2C:     16.9 ml LV SV MOD A4C:     34.6 ml LV SV MOD BP:      17.9 ml RIGHT VENTRICLE            IVC RV Basal diam:  2.50 cm    IVC diam: 0.60 cm RV S prime:     7.51 cm/s TAPSE (M-mode): 1.2 cm LEFT ATRIUM             Index       RIGHT ATRIUM          Index LA diam:        2.60 cm 1.56 cm/m  RA Area:     7.80 cm LA Vol (A2C):   19.4 ml 11.66 ml/m RA Volume:   14.70 ml 8.83 ml/m LA Vol (A4C):   11.9 ml 7.15 ml/m LA Biplane Vol: 16.7 ml 10.03 ml/m  AORTIC VALVE                   PULMONIC VALVE AV Area (Vmax):    2.87 cm    PV Vmax:       0.54 m/s AV Area (Vmean):   3.04 cm    PV Peak grad:  1.2 mmHg AV Area (VTI):     4.14 cm AV Vmax:           97.50 cm/s AV Vmean:          60.900 cm/s AV VTI:            0.113 m AV Peak Grad:      3.8 mmHg AV Mean Grad:      2.0 mmHg LVOT Vmax:         89.20 cm/s LVOT Vmean:        58.900 cm/s LVOT VTI:          0.149 m  LVOT/AV VTI ratio: 1.32  AORTA Ao Root diam: 3.60 cm Ao Asc diam:  4.00 cm Ao Arch diam: 2.9 cm MITRAL VALVE               TRICUSPID VALVE MV Area (PHT): 9.73 cm    TR Peak grad:   21.5 mmHg MV Decel Time: 78 msec     TR Vmax:        232.00 cm/s MV E velocity: 49.30 cm/s MV A velocity: 81.40 cm/s  SHUNTS MV E/A ratio:  0.61        Systemic VTI:  0.15 m                            Systemic Diam: 2.00 cm Ida Rogue MD Electronically signed by Ida Rogue MD Signature Date/Time: 09/14/2019/8:21:14 PM    Final       ASSESSMENT & PLAN:  1. Stage IV adenocarcinoma of lung, left ([place])   2. Hypokalemia   3. Hypomagnesemia   4. Hepatocellular carcinoma metastatic to left lung (HCC)   5. Other fatigue   6. Weight loss   7. Chemotherapy-induced neuropathy ([place])    #Stage IV Lung adenocarcinoma,  Stage IV HCC-07/17/2018 paraspinal soft tissue biopsy Patient is on palliative chemotherapy with carboplatin, Taxol, bevacizumab, Tecentriq. Currently patient is off treatment due to fatigue and decreased performance status. CT chest abdomen pelvis was and images reviewed and discussed with patient.  Also called his daughter to update her about below plan. Patient has reduced the size and conspicuity of the lower of the 2 liver lesions suggesting improvement of this lesion. Increase in volume loss in the bandlike in the left upper lobe along with increased wall thickening of an adjacent to superior segmental left lower lobe bulla.  These findings can be inflammatory or from radiation therapy.  Stable lucency with finger sclerosis in the T11 vertebra.  Chronic findings include coronary arthrosclerosis, aortic thickening, stable ascending thoracic aortic aneurysm, 4.1 cm cholelithiasis, stable hypoenhancement medially in the right kidney upper pole.  Lumbar spondylosis and DJD.  Emphysema and aortic atherosclerosis. Discussed with the patient and daughter that there is no significant cancer progression.  Some  improvement in liver lesions.  #Shortness of breath with exertion, etiology unknown.  Less likely cancer related.  Most likely due to his advanced emphysema. I recommend patient to discuss with primary care provider for further cardiology and pulmonology evaluation. He has atherosclerosis, I recommend patient to start on aspirin 81 mg daily.  Prescription was sent to pharmacy.  #Hypotension, I advised patient to hold off blood pressure medication.  Patient will proceed with IV normal saline x1. I will repeat cortisol level.  #Weight loss, continue follow-up with nutritionist.  Increase Megace to 400 mg daily. Start patient on dexamethasone 4 mg daily to increase appetite  #Chronic hypokalemia, potassium level has normalized to 3.2.   Patient will receive IV potassium chloride 20 mEq today. Recommend patient to continue potassium 10 mEq daily.  Refill prescription was sent to pharmacy. #Fatigue, normal TSH and cortisol level. #Chemotherapy-induced neuropathy, bilateral hands and feet.     Symptoms are getting worse.  I will discontinue further Taxol treatments. Recommend patient to increase gabapentin to 600 mg 3 times daily . # Hypomagnesia, continue slow Mag 1 tab daily,  We spent sufficient time to discuss many aspect of care, questions were answered to patient's satisfaction. All questions were answered. The patient knows to call the clinic with any problems questions or concerns. Follow-up in 1 week.  Earlie Server, MD, PhD 10/26/2019

## 2019-10-26 NOTE — Progress Notes (Signed)
Patient reports he does that his SOBr on exertion has worsened.  He sleeps most of the day and will have difficulty sleeping at night.

## 2019-10-30 ENCOUNTER — Ambulatory Visit (INDEPENDENT_AMBULATORY_CARE_PROVIDER_SITE_OTHER): Payer: Medicare Other | Admitting: Family Medicine

## 2019-10-30 ENCOUNTER — Other Ambulatory Visit: Payer: Self-pay

## 2019-10-30 VITALS — BP 105/72 | HR 85 | Temp 97.6°F | Wt 127.4 lb

## 2019-10-30 DIAGNOSIS — I10 Essential (primary) hypertension: Secondary | ICD-10-CM | POA: Diagnosis not present

## 2019-10-30 DIAGNOSIS — C3492 Malignant neoplasm of unspecified part of left bronchus or lung: Secondary | ICD-10-CM | POA: Diagnosis not present

## 2019-10-30 DIAGNOSIS — M792 Neuralgia and neuritis, unspecified: Secondary | ICD-10-CM

## 2019-10-30 DIAGNOSIS — C220 Liver cell carcinoma: Secondary | ICD-10-CM

## 2019-10-30 DIAGNOSIS — C7802 Secondary malignant neoplasm of left lung: Secondary | ICD-10-CM | POA: Diagnosis not present

## 2019-10-30 NOTE — Patient Instructions (Signed)
STOP HCTZ !!!!  TAKE GABAPENTIN ONLY AT BEDTIME!!

## 2019-10-30 NOTE — Progress Notes (Signed)
Trena Platt Sierra Spargo,acting as a scribe for Wilhemena Durie, MD.,have documented all relevant documentation on the behalf of Wilhemena Durie, MD,as directed by  Wilhemena Durie, MD while in the presence of Wilhemena Durie, MD.  Established patient visit   Patient: Samuel Hood.   DOB: Jul 12, 1950   69 y.o. Male  MRN: [id number] Visit Date: 10/30/2019  Today's healthcare provider: Wilhemena Durie, MD   Chief Complaint  Patient presents with  . Medication Problem   Subjective    HPI   Patient presents in office today to discuss blood pressure medications because his current medication is not working. Patient says that the last few times he has had his blood pressure checked it has been low, he was told to stop taking Hctz. He has not taken his blood pressure pill since last Friday.   He has had no syncope or presyncope. Overall he has no issue other than gabapentin helps his neuropathic pain but makes him sleepy.  Past Medical History:  Diagnosis Date  . Hepatocellular carcinoma metastatic to left lung ([place]) 07/24/2018  . History of angiography    left lower extremity  . Hypertension   . Small bowel obstruction (HCC)        Medications: Outpatient Medications Prior to Visit  Medication Sig  . dexamethasone (DECADRON) 4 MG tablet Take 1 tablet (4 mg total) by mouth daily.  . fentaNYL (DURAGESIC) 25 MCG/HR Place 1 patch onto the skin every 3 (three) days.  Marland Kitchen gabapentin (NEURONTIN) 300 MG capsule Take 2 capsules (600 mg total) by mouth 3 (three) times daily.  . hydrochlorothiazide (HYDRODIURIL) 25 MG tablet Take 1 tablet (25 mg total) by mouth daily.  Marland Kitchen lidocaine-prilocaine (EMLA) cream Apply to affected area once  . magnesium chloride (SLOW-MAG) 64 MG TBEC SR tablet Take 1 tablet (64 mg total) by mouth daily.  . megestrol (MEGACE) 40 MG/ML suspension Take 10 mLs (400 mg total) by mouth daily.  . Multiple Vitamin (MULTIVITAMIN) capsule Take 1 capsule by mouth  daily.  Marland Kitchen oxyCODONE (ROXICODONE) 5 MG immediate release tablet Take 1 tablet (5 mg total) by mouth every 8 (eight) hours as needed for severe pain or breakthrough pain.  . potassium chloride (KLOR-CON) 10 MEQ tablet Take 1 tablet (10 mEq total) by mouth daily.  Marland Kitchen aspirin EC 81 MG tablet Take 1 tablet (81 mg total) by mouth daily. Swallow whole. (Patient not taking: Reported on 10/30/2019)  . loperamide (IMODIUM) 2 MG capsule Take 1 capsule (2 mg total) by mouth See admin instructions. With onset of loose stool, take 63m followed by 272mevery 2 hours until 12 hours have passed without loose bowel movement. Maximum: 16 mg/day (Patient not taking: Reported on 10/26/2019)  . NARCAN 4 MG/0.1ML LIQD nasal spray kit 1 spray once.  (Patient not taking: Reported on 09/25/2019)  . ondansetron (ZOFRAN) 8 MG tablet Take 1 tablet (8 mg total) by mouth 2 (two) times daily as needed for refractory nausea / vomiting. Start on day 3 after carboplatin chemo. (Patient not taking: Reported on 10/26/2019)  . polyethylene glycol (MIRALAX / GLYCOLAX) 17 g packet Take 17 g by mouth daily. (Patient not taking: Reported on 10/26/2019)  . prochlorperazine (COMPAZINE) 10 MG tablet Take 1 tablet (10 mg total) by mouth every 6 (six) hours as needed (Nausea or vomiting). (Patient not taking: Reported on 10/26/2019)  . senna (SENOKOT) 8.6 MG TABS tablet Take 1 tablet (8.6 mg total) by mouth daily. (Patient  not taking: Reported on 10/26/2019)   No facility-administered medications prior to visit.    Review of Systems     Objective    BP 105/72 (BP Location: Left Arm, Patient Position: Sitting, Cuff Size: Normal)   Pulse 85   Temp 97.6 F (36.4 C) (Oral)   Wt 127 lb 6.4 oz (57.8 kg)   BMI 21.20 kg/m  BP Readings from Last 3 Encounters:  10/30/19 105/72  10/26/19 105/81  10/26/19 95/67   Wt Readings from Last 3 Encounters:  10/30/19 127 lb 6.4 oz (57.8 kg)  10/26/19 125 lb 1.6 oz (56.7 kg)  10/17/19 126 lb 3.2 oz (57.2 kg)        Physical Exam Vitals reviewed.  Constitutional:      General: He is not in acute distress.    Appearance: He is well-developed.  HENT:     Head: Normocephalic and atraumatic.     Right Ear: Hearing and tympanic membrane normal.     Left Ear: Hearing and tympanic membrane normal.     Nose: Nose normal.  Eyes:     General: Lids are normal. No scleral icterus.       Right eye: No discharge.        Left eye: No discharge.     Conjunctiva/sclera: Conjunctivae normal.  Cardiovascular:     Rate and Rhythm: Regular rhythm. Tachycardia present.     Pulses: Normal pulses.     Heart sounds: Normal heart sounds.  Pulmonary:     Effort: Pulmonary effort is normal. No respiratory distress.  Abdominal:     General: Bowel sounds are normal.     Palpations: Abdomen is soft.  Musculoskeletal:        General: Normal range of motion.  Skin:    General: Skin is warm.     Findings: No lesion or rash.  Neurological:     Mental Status: He is alert and oriented to person, place, and time.     Sensory: No sensory deficit.  Psychiatric:        Mood and Affect: Mood normal.        Speech: Speech normal.        Behavior: Behavior normal.        Thought Content: Thought content normal.        Judgment: Judgment normal.       No results found for any visits on 10/30/19.  Assessment & Plan     1. Essential hypertension Stop HCTZ and RTC 3 months.  2. Stage IV adenocarcinoma of lung, left (HCC) Per oncology  3. Hepatocellular carcinoma metastatic to left lung (HCC)   4. Nerve pain Take gabapentin just at night.   Return in about 4 months (around 02/29/2020).      I, Richard Gilbert Jr, MD, have reviewed all documentation for this visit. The documentation on 11/03/19 for the exam, diagnosis, procedures, and orders are all accurate and complete.    Richard Gilbert Jr, MD  [facility] [phone] (phone) [phone] (fax)  [facility] 

## 2019-11-01 ENCOUNTER — Inpatient Hospital Stay: Payer: Medicare Other

## 2019-11-01 NOTE — Progress Notes (Signed)
Nutrition Follow-up:  Patient with stage IV adenocarcinoma of lung with mets to liver, bone and chest wall as well as stage IV HCC.  Patient receiving chemotherapy.  Followed by Palliative care.   Spoke with patient via phone.  Patient reports appetite is up and down.  "I am doing the best I can."  Ate cheese toast this am and chicken noodle soup and drank ensure shake.      Medications: megace  Labs: reviewed  Anthropometrics:   Weight 127 lb 6.4 oz on 9/14 stable from 128 lb on 8/12  136 lb on 6/30   NUTRITION DIAGNOSIS: Inadequate oral intake continues   INTERVENTION:  Will leave complimentary case of ensure plus at registration for patient to pick up tomorrow at appointment. Encouraged patient to eat high calorie, high protein    MONITORING, EVALUATION, GOAL: weigh trends, intake   NEXT VISIT: October 21 phone f/u  Laurisa Sahakian B. [name], [place], [place] Registered Dietitian [phone] (mobile)

## 2019-11-02 ENCOUNTER — Encounter: Payer: Self-pay | Admitting: Oncology

## 2019-11-02 ENCOUNTER — Inpatient Hospital Stay: Payer: Medicare Other

## 2019-11-02 ENCOUNTER — Other Ambulatory Visit: Payer: Self-pay

## 2019-11-02 ENCOUNTER — Inpatient Hospital Stay (HOSPITAL_BASED_OUTPATIENT_CLINIC_OR_DEPARTMENT_OTHER): Payer: Medicare Other | Admitting: Oncology

## 2019-11-02 VITALS — BP 117/80 | HR 79 | Temp 97.8°F | Resp 18 | Wt 128.4 lb

## 2019-11-02 VITALS — BP 116/71 | HR 95 | Temp 97.8°F | Resp 20 | Wt 128.4 lb

## 2019-11-02 DIAGNOSIS — C3492 Malignant neoplasm of unspecified part of left bronchus or lung: Secondary | ICD-10-CM

## 2019-11-02 DIAGNOSIS — J449 Chronic obstructive pulmonary disease, unspecified: Secondary | ICD-10-CM | POA: Diagnosis not present

## 2019-11-02 DIAGNOSIS — M47816 Spondylosis without myelopathy or radiculopathy, lumbar region: Secondary | ICD-10-CM | POA: Diagnosis not present

## 2019-11-02 DIAGNOSIS — T451X5A Adverse effect of antineoplastic and immunosuppressive drugs, initial encounter: Secondary | ICD-10-CM | POA: Diagnosis not present

## 2019-11-02 DIAGNOSIS — G620 Drug-induced polyneuropathy: Secondary | ICD-10-CM | POA: Diagnosis not present

## 2019-11-02 DIAGNOSIS — C220 Liver cell carcinoma: Secondary | ICD-10-CM

## 2019-11-02 DIAGNOSIS — C7951 Secondary malignant neoplasm of bone: Secondary | ICD-10-CM

## 2019-11-02 DIAGNOSIS — C3412 Malignant neoplasm of upper lobe, left bronchus or lung: Secondary | ICD-10-CM | POA: Diagnosis not present

## 2019-11-02 DIAGNOSIS — R5383 Other fatigue: Secondary | ICD-10-CM

## 2019-11-02 DIAGNOSIS — R000 Tachycardia, unspecified: Secondary | ICD-10-CM | POA: Diagnosis not present

## 2019-11-02 DIAGNOSIS — K8020 Calculus of gallbladder without cholecystitis without obstruction: Secondary | ICD-10-CM | POA: Diagnosis not present

## 2019-11-02 DIAGNOSIS — G893 Neoplasm related pain (acute) (chronic): Secondary | ICD-10-CM | POA: Diagnosis not present

## 2019-11-02 DIAGNOSIS — C7802 Secondary malignant neoplasm of left lung: Secondary | ICD-10-CM | POA: Diagnosis not present

## 2019-11-02 DIAGNOSIS — I700 Atherosclerosis of aorta: Secondary | ICD-10-CM | POA: Diagnosis not present

## 2019-11-02 DIAGNOSIS — R634 Abnormal weight loss: Secondary | ICD-10-CM

## 2019-11-02 DIAGNOSIS — C787 Secondary malignant neoplasm of liver and intrahepatic bile duct: Secondary | ICD-10-CM | POA: Diagnosis not present

## 2019-11-02 DIAGNOSIS — I712 Thoracic aortic aneurysm, without rupture: Secondary | ICD-10-CM | POA: Diagnosis not present

## 2019-11-02 DIAGNOSIS — E876 Hypokalemia: Secondary | ICD-10-CM | POA: Diagnosis not present

## 2019-11-02 DIAGNOSIS — Z5111 Encounter for antineoplastic chemotherapy: Secondary | ICD-10-CM

## 2019-11-02 DIAGNOSIS — I959 Hypotension, unspecified: Secondary | ICD-10-CM | POA: Diagnosis not present

## 2019-11-02 LAB — CBC WITH DIFFERENTIAL/PLATELET
Abs Immature Granulocytes: 0.1 10*3/uL — ABNORMAL HIGH (ref 0.00–0.07)
Basophils Absolute: 0 10*3/uL (ref 0.0–0.1)
Basophils Relative: 0 %
Eosinophils Absolute: 0 10*3/uL (ref 0.0–0.5)
Eosinophils Relative: 0 %
HCT: 34.2 % — ABNORMAL LOW (ref 39.0–52.0)
Hemoglobin: 11.9 g/dL — ABNORMAL LOW (ref 13.0–17.0)
Immature Granulocytes: 1 %
Lymphocytes Relative: 7 %
Lymphs Abs: 0.7 10*3/uL (ref 0.7–4.0)
MCH: 29.2 pg (ref 26.0–34.0)
MCHC: 34.8 g/dL (ref 30.0–36.0)
MCV: 83.8 fL (ref 80.0–100.0)
Monocytes Absolute: 1 10*3/uL (ref 0.1–1.0)
Monocytes Relative: 10 %
Neutro Abs: 8.1 10*3/uL — ABNORMAL HIGH (ref 1.7–7.7)
Neutrophils Relative %: 82 %
Platelets: 367 10*3/uL (ref 150–400)
RBC: 4.08 MIL/uL — ABNORMAL LOW (ref 4.22–5.81)
RDW: 16.5 % — ABNORMAL HIGH (ref 11.5–15.5)
WBC: 10 10*3/uL (ref 4.0–10.5)
nRBC: 0 % (ref 0.0–0.2)

## 2019-11-02 LAB — COMPREHENSIVE METABOLIC PANEL
ALT: 18 U/L (ref 0–44)
AST: 29 U/L (ref 15–41)
Albumin: 2.6 g/dL — ABNORMAL LOW (ref 3.5–5.0)
Alkaline Phosphatase: 55 U/L (ref 38–126)
Anion gap: 12 (ref 5–15)
BUN: 7 mg/dL — ABNORMAL LOW (ref 8–23)
CO2: 26 mmol/L (ref 22–32)
Calcium: 8.1 mg/dL — ABNORMAL LOW (ref 8.9–10.3)
Chloride: 95 mmol/L — ABNORMAL LOW (ref 98–111)
Creatinine, Ser: 0.47 mg/dL — ABNORMAL LOW (ref 0.61–1.24)
GFR calc Af Amer: >60 mL/min (ref >60)
GFR calc non Af Amer: >60 mL/min (ref >60)
Glucose, Bld: 143 mg/dL — ABNORMAL HIGH (ref 70–99)
Potassium: 2.8 mmol/L — ABNORMAL LOW (ref 3.5–5.1)
Sodium: 133 mmol/L — ABNORMAL LOW (ref 135–145)
Total Bilirubin: 0.6 mg/dL (ref 0.3–1.2)
Total Protein: 6.6 g/dL (ref 6.5–8.1)

## 2019-11-02 LAB — MAGNESIUM: Magnesium: 1.7 mg/dL (ref 1.7–2.4)

## 2019-11-02 MED ORDER — POTASSIUM CHLORIDE ER 10 MEQ PO TBCR: 20.0000 meq | EXTENDED_RELEASE_TABLET | Freq: Every day | ORAL | 1 refills | Status: AC

## 2019-11-02 MED ORDER — GABAPENTIN 300 MG PO CAPS
300.0000 mg | ORAL_CAPSULE | Freq: Three times a day (TID) | ORAL | 0 refills | Status: DC
Start: 2019-11-02 — End: 2020-01-11

## 2019-11-02 MED ORDER — HEPARIN SOD (PORK) LOCK FLUSH 100 UNIT/ML IV SOLN
500.0000 [IU] | Freq: Once | INTRAVENOUS | Status: AC
  Administered 2019-11-02: 500 [IU] via INTRAVENOUS
  Filled 2019-11-02: qty 5

## 2019-11-02 MED ORDER — SODIUM CHLORIDE 0.9% FLUSH
10.0000 mL | INTRAVENOUS | Status: DC | PRN
  Administered 2019-11-02: 10 mL via INTRAVENOUS
  Filled 2019-11-02: qty 10

## 2019-11-02 MED ORDER — SODIUM CHLORIDE 0.9 % IV SOLN
INTRAVENOUS | Status: DC
  Filled 2019-11-02 (×2): qty 1000

## 2019-11-02 NOTE — Progress Notes (Signed)
Tolerated 2 hours of IVF's and potassium replacement. Denies pain. Ambulatory leaving clinic today.

## 2019-11-02 NOTE — Progress Notes (Signed)
Hematology/Oncology follow up note [facility] [phone] [phone]  Reassurance Patient Care Team: Gilbert, Richard L Jr., MD as PCP - General (Family Medicine) Agbor-Etang, Brian, MD as PCP - Cardiology (Cardiology) Rhode, Hayley, RN as Registered Nurse Stanton, Kristi D, RN as Registered Nurse  REFERRING PROVIDER: Gilbert, Richard L Jr.,*  CHIEF COMPLAINTS/REASON FOR VISIT:  metastatic cancer management.  HISTORY OF PRESENTING ILLNESS:   Samuel D Rickles Jr. is a  69 y.o.  male with PMH listed below was seen in consultation at the request of  Gilbert, Richard L Jr.,*  for evaluation of lung nodule in the liver lesion. Patient has a past medical history of hypertension, alcohol abuse, smoking emphysema.  He has had serial CTs done in the past.  CT images were independently reviewed by me. 07/26/2017 CT chest with contrast showed extensive pleural-parenchymal scarring within the left upper lobe with several areas of soft tissue nodularity measuring up to 1.6 cm.  In this patient who is at increased risk of lung cancer further investigation with PET scan is recommended. Small chronic appearance loculated hydropneumothorax overlies the left apex. Slowly enlarging lesion within the central portion of the right lobe of liver identified. Per note, patient supposed to have additional work-up done in December repeat CT scan which he did not  Patient was recently seen by primary care provider and has subsequent image done for follow-up. CT on 06/30/2018 showed multiple significantly enlarged paraspinal mass are noted concerning.  The largest measures 3.8 x 0.5 cm and there appears to be a lytic destruction of the adjacent T11 vertebral body.  Also noted is new solid density measuring 17 x 12 mm in the pleural parenchymal density in the left upper lobe noted on prior exam.  Concerning for malignancy.  4.4 ascending thoracic aortic aneurysm.  Recommend annual  imaging.  Patient had PET scan done on 07/06/2018 Which showed there are 2 FDG avid lesion within the left upper lobe worrisome for primary lung neoplasm.  There is evidence of chest wall involvement.  Metastasis to the posterior mediastinum is identified with extension into T11 vertebra and possible involvement of the left T12 neural foramina.  Patient reports left shoulder pain.  Smoking half a pack a day not motivated with further questioning quitting Denies weight loss, hemoptysis, cough.  Chronic shortness of breath with exertion. He drinks alcohol daily. Lives with his girlfriend.  He has 5 adult kids  #Hepatitis C  # 6/1?2020  CT-guided biopsy of left-sided posterior mediastinal soft tissue adjacent to T10/11. Patient's case was discussed on tumor board. Consensus was reached that HCC versus primary lung cancer with hepatoid adenocarcinoma features. Consensus was to proceed with liver biopsy first as liver mass was not FDG avid on PET scan.  Patient underwent liver biopsy and the present to discuss pathology reports.  ## 08/08/2018 Liver mass biopsy showed fragments of necrotic and calcified tissue with adjacent fibrous capsule Scant viable nonneoplastic liver tissue with steatosis, inflammation and non specific fibrosis.  # 08/28/2018 CT guided left upper lobe lung mass biopsy showed poorly differentiated adenocarcinoma of lung origin.  6/19-08/16/2018  Finished palliative radiation to paraspinal soft tissue mass. 09/07/2018 patient was started on lenvatinib 12 mg daily. 10/13/2018 SBRT to lung cancer lesion.  #Patient is on Zometa monthly #He was advised to take calcium supplement.  He reports he quit taking calcium as he broke out rash on her scalp, neck and chest after taking calcium. # 03/30/19 liver lesion biopsy showed poorly   differentiated adenocarcinoma, compatible with lung primary.  INTERVAL HISTORY Samuel Hood. is a 69 y.o. male who has above history reviewed by me today  presents for evaluation prior to chemotherapy for lung cancer and HCC.  #Patient continues to feel fatigue and tired.  He mentions that 1 day is good and 1 days back. For neuropathy, patient was suggested to take 600 mg of gabapentin 3 times a day.  Patient was indeed taking 900 mg in the morning and a he does not take any additional dose during the day as he feels additional dose does not help. Chronic numbness and tingling of his hands, stable. He denies any pain today.  Not much of shortness of breath as well. It is unclear whether he is taking Megace 400 mg daily.  It is unclear whether he is taking his potassium pills or not. He takes fentanyl patch and oxycodone and gabapentin for bilateral lower extremity neuropathy pain.   Review of Systems  Constitutional: Positive for fatigue and unexpected weight change. Negative for appetite change, chills and fever.  HENT:   Negative for hearing loss and voice change.   Eyes: Negative for eye problems and icterus.  Respiratory: Negative for chest tightness, cough and shortness of breath.   Cardiovascular: Negative for chest pain and leg swelling.  Gastrointestinal: Negative for abdominal distention, abdominal pain and constipation.  Endocrine: Negative for hot flashes.  Genitourinary: Negative for difficulty urinating, dysuria and frequency.   Musculoskeletal: Negative for arthralgias.  Skin: Negative for itching and rash.  Neurological: Positive for numbness. Negative for light-headedness.  Hematological: Negative for adenopathy. Does not bruise/bleed easily.  Psychiatric/Behavioral: Negative for confusion.    MEDICAL HISTORY:  Past Medical History:  Diagnosis Date  . Hepatocellular carcinoma metastatic to left lung ([place]) 07/24/2018  . History of angiography    left lower extremity  . Hypertension   . Small bowel obstruction ([place])     SURGICAL HISTORY: Past Surgical History:  Procedure Laterality Date  . COLON SURGERY    .  COLONOSCOPY W/ POLYPECTOMY    . COLONOSCOPY WITH PROPOFOL N/A 01/31/2018   Procedure: COLONOSCOPY WITH PROPOFOL;  Surgeon: Toledo, Benay Pike, MD;  Location: [facility] ENDOSCOPY;  Service: Gastroenterology;  Laterality: N/A;  . debridement fasciotomy leg, left Left 03/19/2016  . LAPAROTOMY N/A 09/27/2014   Procedure: EXPLORATORY LAPAROTOMY;  Surgeon: Dia Crawford III, MD;  Location: [facility] ORS;  Service: General;  Laterality: N/A;  . PORTA CATH INSERTION N/A 04/10/2019   Procedure: PORTA CATH INSERTION;  Surgeon: Katha Cabal, MD;  Location: [place] [facility];  Service: Cardiovascular;  Laterality: N/A;    SOCIAL HISTORY: Social History   Socioeconomic History  . Marital status: Single    Spouse name: Not on file  . Number of children: Not on file  . Years of education: Not on file  . Highest education level: Not on file  Occupational History  . Occupation: retired  Tobacco Use  . Smoking status: Current Every Day Smoker    Packs/day: 0.25    Years: 50.00    Pack years: 12.50  . Smokeless tobacco: Never Used  . Tobacco comment: 3 cigarettes per day  Vaping Use  . Vaping Use: Never used  Substance and Sexual Activity  . Alcohol use: Not Currently    Alcohol/week: 35.0 standard drinks    Types: 30 Cans of beer, 5 Shots of liquor per week  . Drug use: No  . Sexual activity: Not on file  Other  Topics Concern  . Not on file  Social History Narrative  . Not on file   Social Determinants of Health   Financial Resource Strain:   . Difficulty of Paying Living Expenses: Not on file  Food Insecurity:   . Worried About Charity fundraiser in the Last Year: Not on file  . Ran Out of Food in the Last Year: Not on file  Transportation Needs:   . Lack of Transportation (Medical): Not on file  . Lack of Transportation (Non-Medical): Not on file  Physical Activity:   . Days of Exercise per Week: Not on file  . Minutes of Exercise per Session: Not on file  Stress:   . Feeling of  Stress : Not on file  Social Connections:   . Frequency of Communication with Friends and Family: Not on file  . Frequency of Social Gatherings with Friends and Family: Not on file  . Attends Religious Services: Not on file  . Active Member of Clubs or Organizations: Not on file  . Attends Archivist Meetings: Not on file  . Marital Status: Not on file  Intimate Partner Violence:   . Fear of Current or Ex-Partner: Not on file  . Emotionally Abused: Not on file  . Physically Abused: Not on file  . Sexually Abused: Not on file    FAMILY HISTORY: Family History  Problem Relation Age of Onset  . Cancer Mother   . Cancer Father     ALLERGIES:  is allergic to 5-alpha reductase inhibitors.  MEDICATIONS:  Current Outpatient Medications  Medication Sig Dispense Refill  . dexamethasone (DECADRON) 4 MG tablet Take 1 tablet (4 mg total) by mouth daily. 10 tablet 0  . fentaNYL (DURAGESIC) 25 MCG/HR Place 1 patch onto the skin every 3 (three) days. 10 patch 0  . gabapentin (NEURONTIN) 300 MG capsule Take 2 capsules (600 mg total) by mouth 3 (three) times daily. 120 capsule 0  . hydrochlorothiazide (HYDRODIURIL) 25 MG tablet Take 1 tablet (25 mg total) by mouth daily. 30 tablet 12  . lidocaine-prilocaine (EMLA) cream Apply to affected area once 30 g 3  . magnesium chloride (SLOW-MAG) 64 MG TBEC SR tablet Take 1 tablet (64 mg total) by mouth daily. 30 tablet 1  . megestrol (MEGACE) 40 MG/ML suspension Take 10 mLs (400 mg total) by mouth daily. 240 mL 0  . Multiple Vitamin (MULTIVITAMIN) capsule Take 1 capsule by mouth daily.    Marland Kitchen oxyCODONE (ROXICODONE) 5 MG immediate release tablet Take 1 tablet (5 mg total) by mouth every 8 (eight) hours as needed for severe pain or breakthrough pain. 60 tablet 0  . potassium chloride (KLOR-CON) 10 MEQ tablet Take 1 tablet (10 mEq total) by mouth daily. 30 tablet 1  . aspirin EC 81 MG tablet Take 1 tablet (81 mg total) by mouth daily. Swallow whole.  (Patient not taking: Reported on 10/30/2019) 90 tablet 1  . loperamide (IMODIUM) 2 MG capsule Take 1 capsule (2 mg total) by mouth See admin instructions. With onset of loose stool, take $RemoveBef'4mg'YTPTdEOCiy$  followed by $RemoveBef'2mg'piZXHKYBuL$  every 2 hours until 12 hours have passed without loose bowel movement. Maximum: 16 mg/day (Patient not taking: Reported on 10/26/2019) 60 capsule 1  . NARCAN 4 MG/0.1ML LIQD nasal spray kit 1 spray once.  (Patient not taking: Reported on 09/25/2019)    . ondansetron (ZOFRAN) 8 MG tablet Take 1 tablet (8 mg total) by mouth 2 (two) times daily as needed for refractory nausea /  vomiting. Start on day 3 after carboplatin chemo. (Patient not taking: Reported on 10/26/2019) 30 tablet 1  . polyethylene glycol (MIRALAX / GLYCOLAX) 17 g packet Take 17 g by mouth daily. (Patient not taking: Reported on 10/26/2019) 14 each 0  . prochlorperazine (COMPAZINE) 10 MG tablet Take 1 tablet (10 mg total) by mouth every 6 (six) hours as needed (Nausea or vomiting). (Patient not taking: Reported on 10/26/2019) 30 tablet 1  . senna (SENOKOT) 8.6 MG TABS tablet Take 1 tablet (8.6 mg total) by mouth daily. (Patient not taking: Reported on 10/26/2019) 120 tablet 0   No current facility-administered medications for this visit.   Facility-Administered Medications Ordered in Other Visits  Medication Dose Route Frequency Provider Last Rate Last Admin  . sodium chloride 0.9 % 1,000 mL with potassium chloride 40 mEq infusion   Intravenous Continuous Earlie Server, MD   Stopped at 11/02/19 1324  . sodium chloride flush (NS) 0.9 % injection 10 mL  10 mL Intravenous PRN Earlie Server, MD   10 mL at 11/02/19 1019     PHYSICAL EXAMINATION: ECOG PERFORMANCE STATUS: 1 - Symptomatic but completely ambulatory Vitals:   11/02/19 1001  BP: 116/71  Pulse: 95  Resp: 20  Temp: 97.8 F (36.6 C)  SpO2: 100%   Filed Weights   11/02/19 1001  Weight: 128 lb 6.4 oz (58.2 kg)    Physical Exam Constitutional:      General: He is not in acute  distress.    Appearance: He is not ill-appearing.  HENT:     Head: Normocephalic and atraumatic.  Eyes:     General: No scleral icterus. Cardiovascular:     Rate and Rhythm: Normal rate and regular rhythm.     Heart sounds: Normal heart sounds.  Pulmonary:     Effort: Pulmonary effort is normal. No respiratory distress.     Breath sounds: No wheezing or rales.     Comments: Severely decreased bilateral breath sound. Abdominal:     General: Bowel sounds are normal. There is no distension.     Palpations: Abdomen is soft.  Musculoskeletal:        General: No deformity. Normal range of motion.     Cervical back: Normal range of motion and neck supple.  Skin:    General: Skin is warm and dry.     Findings: No erythema or rash.  Neurological:     Mental Status: He is alert and oriented to person, place, and time. Mental status is at baseline.     Cranial Nerves: No cranial nerve deficit.     Coordination: Coordination normal.  Psychiatric:        Mood and Affect: Mood normal.     LABORATORY DATA:  I have reviewed the data as listed Lab Results  Component Value Date   WBC 10.0 11/02/2019   HGB 11.9 (L) 11/02/2019   HCT 34.2 (L) 11/02/2019   MCV 83.8 11/02/2019   PLT 367 11/02/2019   Recent Labs    10/17/19 0819 10/17/19 0819 10/19/19 0935 10/26/19 1238 11/02/19 0957  NA 129*   < > 130* 133* 133*  K 3.0*   < > 3.0* 3.2* 2.8*  CL 90*   < > 91* 95* 95*  CO2 29   < > $R'25 27 26  'nL$ GLUCOSE 132*   < > 168* 99 143*  BUN 14   < > 10 10 7*  CREATININE 0.75   < > 0.72 0.61 0.47*  CALCIUM 8.5*   < >  8.8* 8.7* 8.1*  GFRNONAA >60   < > >60 >60 >60  GFRAA >60   < > >60 >60 >60  PROT 7.3  --  7.0  --  6.6  ALBUMIN 3.2*  --  3.1*  --  2.6*  AST 48*  --  37  --  29  ALT 43  --  34  --  18  ALKPHOS 63  --  61  --  55  BILITOT 1.0  --  0.7  --  0.6   < > = values in this interval not displayed.   Iron/TIBC/Ferritin/ %Sat No results found for: IRON, TIBC, FERRITIN, IRONPCTSAT    RADIOGRAPHIC STUDIES: I have personally reviewed the radiological images as listed and agreed with the findings in the report. CT CHEST ABDOMEN PELVIS W CONTRAST  Result Date: 10/24/2019 CLINICAL DATA:  Left lung cancer restaging. Worsening shortness of breath on exertion. EXAM: CT CHEST, ABDOMEN, AND PELVIS WITH CONTRAST TECHNIQUE: Multidetector CT imaging of the chest, abdomen and pelvis was performed following the standard protocol during bolus administration of intravenous contrast. CONTRAST:  57mL OMNIPAQUE IOHEXOL 300 MG/ML  SOLN COMPARISON:  Multiple exams, including CT examinations from 07/04/2019 and 07/24/2019 FINDINGS: CT CHEST FINDINGS Cardiovascular: Ascending thoracic aortic aneurysm 4.1 cm in diameter on image 32/2, unchanged. Considerable collateralization from the left upper extremity venous injection possibly due to stenosis in the subclavian vein or of lower SVC, resulting in an unusual contrast bolus with considerable pulmonary arterial contrast greater than the systemic arterial contrast. Atherosclerotic calcification of the aortic arch and coronary arteries. Mediastinum/Nodes: Unremarkable Lungs/Pleura: Severe emphysema. Increased bandlike density and volume loss in the left upper lobe. Increased wall thickening associated with a superior segment left lower lobe bulla, previously with wall thickness of only about 0.2 cm, currently up to 0.8 cm on image 43/3. Infected bulla not excluded although this could be from radiation therapy. Airway thickening is present, suggesting bronchitis or reactive airways disease. Musculoskeletal: Thoracic spondylosis. No recurrent paraspinal mass. Stable lucency with thin rim sclerosis in the T11 vertebra, indicating site of prior bony erosion from the paraspinal mass. CT ABDOMEN PELVIS FINDINGS Hepatobiliary: The phase of contrast is early based on the contrast timing issues noted in the chest section. We did cover the entire liver on the delayed images  as well. Dependent density in the gallbladder favoring gallstones. Stable 2.9 by 2.9 cm hypodense lesion in the right hepatic lobe with faint marginal and internal septations and calcification, probably a previously treated metastatic lesion. A subtle hypodense lesion further inferiorly in the right hepatic lobe measuring 0.8 cm in diameter on image 17/9 previously measured about 1.9 cm by my measurements, and is accordingly reduced in size. No new liver lesion.  No biliary dilatation is identified. Pancreas: Unremarkable Spleen: Unremarkable Adrenals/Urinary Tract: Stable band of hypoenhancement medially in the right kidney upper pole as on image 11/9. If the patient has had prior radiation therapy in the region, that may be the cause for this chronic appearance. Right mid kidney hypodense lesions are again observed, the larger lesion is thought to be complex but sharply defined and otherwise nonspecific. Left kidney appears normal. Adrenal glands unremarkable. Stomach/Bowel: Postoperative findings in the sigmoid colon. Vascular/Lymphatic: Aortoiliac atherosclerotic vascular disease. Porta hepatis/peripancreatic node measures is 0.9 cm in short axis on image 63/2, stable. No pathologic adenopathy is identified. Reproductive: Speckled calcifications along the penile tunica albuginea. Other: No supplemental non-categorized findings. Musculoskeletal: Spurring of both sacroiliac joints. Grade 1 degenerative anterolisthesis at  L5-S1. Lumbar spondylosis and degenerative disc disease causing multilevel impingement. IMPRESSION: 1. Reduced size and conspicuity of the lower of the 2 liver lesions, suggesting improvement of this lesion. The other lesion is stable. 2. There is increase in volume loss and bandlike density in the left upper lobe, along with increased wall thickening of an adjacent superior segment left lower lobe bulla. These findings could be inflammatory or from radiation therapy, strictly speaking, underlying  progressive malignancy is difficult to exclude. 3. Stable lucency with thin rim sclerosis in the T11 vertebra, indicating site of prior bony invasion from the paraspinal mass. No recurrent paraspinal mass is present. 4. Other imaging findings of potential clinical significance: Coronary atherosclerosis. Airway thickening is present, suggesting bronchitis or reactive airways disease. Stable 4.1 cm ascending thoracic aortic aneurysm. Cholelithiasis. Stable band of hypoenhancement medially in the right kidney upper pole, probably from prior radiation therapy. Lumbar spondylosis and degenerative disc disease causing multilevel impingement. 5. Emphysema and aortic atherosclerosis. Aortic Atherosclerosis (ICD10-I70.0) and Emphysema (ICD10-J43.9). Electronically Signed   By: Van Clines M.D.   On: 10/24/2019 14:36   ECHOCARDIOGRAM COMPLETE  Result Date: 09/14/2019    ECHOCARDIOGRAM REPORT   Patient Name:   Kiptyn Rafuse. Date of Exam: 09/14/2019 Medical Rec #:  [id number]        Height:       65.0 in Accession #:    [id number]       Weight:       133.2 lb Date of Birth:  Jun 07, 1950         BSA:          1.664 m Patient Age:    57 years         BP:           132/90 mmHg Patient Gender: M                HR:           103 bpm. Exam Location:   Procedure: 2D Echo, Cardiac Doppler and Color Doppler Indications:    R06.02 SOB  History:        Patient has no prior history of Echocardiogram examinations.                 Thoracic aortic aneurysm, COPD, Arrythmias:Tachycardia,                 Signs/Symptoms:Shortness of Breath; Risk Factors:Former Smoker                 and Hypertension.  Sonographer:    Pilar Jarvis RDMS, RVT, RDCS Referring Phys: [id number] BRIAN AGBOR-ETANG IMPRESSIONS  1. Left ventricular ejection fraction, by estimation, is 55%. The left ventricle has normal function. The left ventricle has no regional wall motion abnormalities. There is mild left ventricular hypertrophy. Left ventricular  diastolic parameters are consistent with Grade I diastolic dysfunction (impaired relaxation).  2. Right ventricular systolic function is normal. The right ventricular size is normal. There is normal pulmonary artery systolic pressure. The estimated right ventricular systolic pressure is 50.3 mmHg.  3. Tricuspid valve regurgitation is mild to moderate.  4. There is dilatation of the aortic root measuring 36 mm. Ascending aorta 4.0 cm FINDINGS  Left Ventricle: Left ventricular ejection fraction, by estimation, is 55 to 60%. The left ventricle has normal function. The left ventricle has no regional wall motion abnormalities. The left ventricular internal cavity size was normal in size. There is  mild left ventricular hypertrophy. Left ventricular  diastolic parameters are consistent with Grade I diastolic dysfunction (impaired relaxation). Right Ventricle: The right ventricular size is normal. No increase in right ventricular wall thickness. Right ventricular systolic function is normal. There is normal pulmonary artery systolic pressure. The tricuspid regurgitant velocity is 2.32 m/s, and  with an assumed right atrial pressure of 10 mmHg, the estimated right ventricular systolic pressure is 30.8 mmHg. Left Atrium: Left atrial size was normal in size. Right Atrium: Right atrial size was normal in size. Pericardium: There is no evidence of pericardial effusion. Mitral Valve: The mitral valve is normal in structure. Normal mobility of the mitral valve leaflets. No evidence of mitral valve regurgitation. No evidence of mitral valve stenosis. Tricuspid Valve: The tricuspid valve is normal in structure. Tricuspid valve regurgitation is mild to moderate. No evidence of tricuspid stenosis. Aortic Valve: The aortic valve is normal in structure. Aortic valve regurgitation is not visualized. Mild to moderate aortic valve sclerosis/calcification is present, without any evidence of aortic stenosis. Aortic valve mean gradient  measures 2.0 mmHg. Aortic valve peak gradient measures 3.8 mmHg. Aortic valve area, by VTI measures 4.14 cm. Pulmonic Valve: The pulmonic valve was normal in structure. Pulmonic valve regurgitation is not visualized. No evidence of pulmonic stenosis. Aorta: The aortic root is normal in size and structure. There is dilatation of the aortic root measuring 36 mm. Venous: The inferior vena cava is normal in size with greater than 50% respiratory variability, suggesting right atrial pressure of 3 mmHg. IAS/Shunts: No atrial level shunt detected by color flow Doppler.  LEFT VENTRICLE PLAX 2D LVIDd:         2.90 cm     Diastology LVIDs:         2.00 cm     LV e' lateral:   11.10 cm/s LV PW:         0.80 cm     LV E/e' lateral: 4.4 LV IVS:        0.80 cm     LV e' medial:    6.74 cm/s LVOT diam:     2.00 cm     LV E/e' medial:  7.3 LV SV:         47 LV SV Index:   28 LVOT Area:     3.14 cm  LV Volumes (MOD) LV vol d, MOD A2C: 37.6 ml LV vol d, MOD A4C: 34.6 ml LV vol s, MOD A2C: 20.7 ml LV vol s, MOD A4C: 19.0 ml LV SV MOD A2C:     16.9 ml LV SV MOD A4C:     34.6 ml LV SV MOD BP:      17.9 ml RIGHT VENTRICLE            IVC RV Basal diam:  2.50 cm    IVC diam: 0.60 cm RV S prime:     7.51 cm/s TAPSE (M-mode): 1.2 cm LEFT ATRIUM             Index       RIGHT ATRIUM          Index LA diam:        2.60 cm 1.56 cm/m  RA Area:     7.80 cm LA Vol (A2C):   19.4 ml 11.66 ml/m RA Volume:   14.70 ml 8.83 ml/m LA Vol (A4C):   11.9 ml 7.15 ml/m LA Biplane Vol: 16.7 ml 10.03 ml/m  AORTIC VALVE  PULMONIC VALVE AV Area (Vmax):    2.87 cm    PV Vmax:       0.54 m/s AV Area (Vmean):   3.04 cm    PV Peak grad:  1.2 mmHg AV Area (VTI):     4.14 cm AV Vmax:           97.50 cm/s AV Vmean:          60.900 cm/s AV VTI:            0.113 m AV Peak Grad:      3.8 mmHg AV Mean Grad:      2.0 mmHg LVOT Vmax:         89.20 cm/s LVOT Vmean:        58.900 cm/s LVOT VTI:          0.149 m LVOT/AV VTI ratio: 1.32  AORTA Ao Root  diam: 3.60 cm Ao Asc diam:  4.00 cm Ao Arch diam: 2.9 cm MITRAL VALVE               TRICUSPID VALVE MV Area (PHT): 9.73 cm    TR Peak grad:   21.5 mmHg MV Decel Time: 78 msec     TR Vmax:        232.00 cm/s MV E velocity: 49.30 cm/s MV A velocity: 81.40 cm/s  SHUNTS MV E/A ratio:  0.61        Systemic VTI:  0.15 m                            Systemic Diam: 2.00 cm Ida Rogue MD Electronically signed by Ida Rogue MD Signature Date/Time: 09/14/2019/8:21:14 PM    Final       ASSESSMENT & PLAN:  1. Stage IV adenocarcinoma of lung, left ([place])   2. Hepatocellular carcinoma metastatic to left lung ([place])   3. Hypokalemia   4. Hypomagnesemia   5. Other fatigue   6. Weight loss   7. Chemotherapy-induced neuropathy ([place])   8. Neoplasm related pain    #Stage IV Lung adenocarcinoma, Stage IV HCC-07/17/2018 paraspinal soft tissue biopsy Patient is on palliative chemotherapy with carboplatin, Taxol, bevacizumab, Tecentriq. Of treatment due to poor performance status. Most CT scan showed stable disease.  #Hypokalemia, patient will receive 40 mEq of IV potassium chloride over 2 hours today I advised patient to increase oral potassium chloride to 20 mEq daily #Hypomagnesia, recommend him to take Slow-Mag 64 mg once daily.  #Hypotension, patient has seen primary care provider and HCTZ has been discontinued. Today blood pressure has improved.  Patient will receive 1 L of IV normal saline for poor oral intake  #Weight loss, continue follow-up with nutritionist.  It is difficult to tell whether he is taking Megace 400 mg and or dexamethasone.  Recommend home care for management of his medication.  #Fatigue, normal TSH and cortisol level. #Chemotherapy-induced neuropathy, bilateral hands and feet.     Patient reports that he cannot tolerate 600 mg of gabapentin 3 times a day.  He is currently taking total of 900/day. . Continue follow-up with palliative service We spent sufficient time to discuss  many aspect of care, questions were answered to patient's satisfaction. All questions were answered. The patient knows to call the clinic with any problems questions or concerns. Follow-up in 1 week.  Earlie Server, MD, PhD 11/02/2019

## 2019-11-05 ENCOUNTER — Telehealth: Payer: Self-pay

## 2019-11-05 ENCOUNTER — Telehealth: Payer: Self-pay | Admitting: Nurse Practitioner

## 2019-11-05 NOTE — Telephone Encounter (Signed)
Ok to give home health orders, looks like palliative care is consulting as well.  If new symptoms or concerns he should schedule office visit or see oncologist.

## 2019-11-05 NOTE — Telephone Encounter (Signed)
Left message for nurse to call office back. KW

## 2019-11-05 NOTE — Telephone Encounter (Signed)
Spoke with patient's daughter, Georgena Spurling, regarding Palliative services and all questions were answered and she stated that patient was in agreement with starting Palliative services.  I have scheduled an In-person Consult for 11/20/19 @ 3:30 PM

## 2019-11-05 NOTE — Telephone Encounter (Signed)
Cecille Rubin needs homehealth orders on [facility].  Please call Cecille Rubin with orders.  1- W9 2 - prn

## 2019-11-05 NOTE — Telephone Encounter (Signed)
Please review. KW 

## 2019-11-07 ENCOUNTER — Telehealth: Payer: Self-pay | Admitting: *Deleted

## 2019-11-07 NOTE — Telephone Encounter (Signed)
Verbal order given to [facility] for the requested nursing visits.

## 2019-11-07 NOTE — Telephone Encounter (Signed)
Jeani Hawking from [facility] called asking for order approval for Nursing visits 1 wk 9 plus 2 as needed visits. Please advise

## 2019-11-08 ENCOUNTER — Telehealth: Payer: Self-pay | Admitting: *Deleted

## 2019-11-08 NOTE — Telephone Encounter (Signed)
Wendelyn Breslow, occupational therapist with [facility] called requesting orders for occupational therapy 1wk1, 2wk2,1wk1, 2wk2 and for PT 1wk1, 2wk2,1 wk2.   She also reports that patient is not taking his Gabapentin as ordered, he is taking all 2 capsules for the day once a day stating that is what works for him.   She is also requesting that we send an order for a BSC to a DME company of our choice and to let her know where it was sent. She states that he is having great difficulty in the home getting off the toilet and is pulling on the shower door to get up and it is coming out of the wall.

## 2019-11-09 ENCOUNTER — Inpatient Hospital Stay (HOSPITAL_BASED_OUTPATIENT_CLINIC_OR_DEPARTMENT_OTHER): Payer: Medicare Other | Admitting: Oncology

## 2019-11-09 ENCOUNTER — Other Ambulatory Visit: Payer: Self-pay

## 2019-11-09 ENCOUNTER — Encounter: Payer: Self-pay | Admitting: Oncology

## 2019-11-09 ENCOUNTER — Inpatient Hospital Stay: Payer: Medicare Other

## 2019-11-09 VITALS — BP 113/77 | HR 106 | Temp 97.6°F | Resp 18 | Wt 130.5 lb

## 2019-11-09 DIAGNOSIS — G620 Drug-induced polyneuropathy: Secondary | ICD-10-CM | POA: Diagnosis not present

## 2019-11-09 DIAGNOSIS — J449 Chronic obstructive pulmonary disease, unspecified: Secondary | ICD-10-CM | POA: Diagnosis not present

## 2019-11-09 DIAGNOSIS — C3492 Malignant neoplasm of unspecified part of left bronchus or lung: Secondary | ICD-10-CM

## 2019-11-09 DIAGNOSIS — C220 Liver cell carcinoma: Secondary | ICD-10-CM

## 2019-11-09 DIAGNOSIS — C787 Secondary malignant neoplasm of liver and intrahepatic bile duct: Secondary | ICD-10-CM | POA: Diagnosis not present

## 2019-11-09 DIAGNOSIS — E876 Hypokalemia: Secondary | ICD-10-CM | POA: Diagnosis not present

## 2019-11-09 DIAGNOSIS — R5383 Other fatigue: Secondary | ICD-10-CM

## 2019-11-09 DIAGNOSIS — R634 Abnormal weight loss: Secondary | ICD-10-CM | POA: Diagnosis not present

## 2019-11-09 DIAGNOSIS — C7951 Secondary malignant neoplasm of bone: Secondary | ICD-10-CM

## 2019-11-09 DIAGNOSIS — T451X5A Adverse effect of antineoplastic and immunosuppressive drugs, initial encounter: Secondary | ICD-10-CM | POA: Diagnosis not present

## 2019-11-09 DIAGNOSIS — I712 Thoracic aortic aneurysm, without rupture: Secondary | ICD-10-CM | POA: Diagnosis not present

## 2019-11-09 DIAGNOSIS — R000 Tachycardia, unspecified: Secondary | ICD-10-CM | POA: Diagnosis not present

## 2019-11-09 DIAGNOSIS — C7802 Secondary malignant neoplasm of left lung: Secondary | ICD-10-CM

## 2019-11-09 DIAGNOSIS — M47816 Spondylosis without myelopathy or radiculopathy, lumbar region: Secondary | ICD-10-CM | POA: Diagnosis not present

## 2019-11-09 DIAGNOSIS — I959 Hypotension, unspecified: Secondary | ICD-10-CM | POA: Diagnosis not present

## 2019-11-09 DIAGNOSIS — I700 Atherosclerosis of aorta: Secondary | ICD-10-CM | POA: Diagnosis not present

## 2019-11-09 DIAGNOSIS — G893 Neoplasm related pain (acute) (chronic): Secondary | ICD-10-CM | POA: Diagnosis not present

## 2019-11-09 DIAGNOSIS — C3412 Malignant neoplasm of upper lobe, left bronchus or lung: Secondary | ICD-10-CM | POA: Diagnosis not present

## 2019-11-09 DIAGNOSIS — Z5111 Encounter for antineoplastic chemotherapy: Secondary | ICD-10-CM

## 2019-11-09 DIAGNOSIS — K8020 Calculus of gallbladder without cholecystitis without obstruction: Secondary | ICD-10-CM | POA: Diagnosis not present

## 2019-11-09 LAB — CBC WITH DIFFERENTIAL/PLATELET
Abs Immature Granulocytes: 0.08 10*3/uL — ABNORMAL HIGH (ref 0.00–0.07)
Basophils Absolute: 0 10*3/uL (ref 0.0–0.1)
Basophils Relative: 0 %
Eosinophils Absolute: 0.1 10*3/uL (ref 0.0–0.5)
Eosinophils Relative: 1 %
HCT: 33.4 % — ABNORMAL LOW (ref 39.0–52.0)
Hemoglobin: 11.5 g/dL — ABNORMAL LOW (ref 13.0–17.0)
Immature Granulocytes: 1 %
Lymphocytes Relative: 11 %
Lymphs Abs: 1.1 10*3/uL (ref 0.7–4.0)
MCH: 28.9 pg (ref 26.0–34.0)
MCHC: 34.4 g/dL (ref 30.0–36.0)
MCV: 83.9 fL (ref 80.0–100.0)
Monocytes Absolute: 1 10*3/uL (ref 0.1–1.0)
Monocytes Relative: 10 %
Neutro Abs: 7.8 10*3/uL — ABNORMAL HIGH (ref 1.7–7.7)
Neutrophils Relative %: 77 %
Platelets: 471 10*3/uL — ABNORMAL HIGH (ref 150–400)
RBC: 3.98 MIL/uL — ABNORMAL LOW (ref 4.22–5.81)
RDW: 16.4 % — ABNORMAL HIGH (ref 11.5–15.5)
WBC: 10.1 10*3/uL (ref 4.0–10.5)
nRBC: 0 % (ref 0.0–0.2)

## 2019-11-09 LAB — COMPREHENSIVE METABOLIC PANEL
ALT: 20 U/L (ref 0–44)
AST: 38 U/L (ref 15–41)
Albumin: 2.5 g/dL — ABNORMAL LOW (ref 3.5–5.0)
Alkaline Phosphatase: 60 U/L (ref 38–126)
Anion gap: 11 (ref 5–15)
BUN: 7 mg/dL — ABNORMAL LOW (ref 8–23)
CO2: 26 mmol/L (ref 22–32)
Calcium: 8.3 mg/dL — ABNORMAL LOW (ref 8.9–10.3)
Chloride: 101 mmol/L (ref 98–111)
Creatinine, Ser: 0.49 mg/dL — ABNORMAL LOW (ref 0.61–1.24)
GFR calc Af Amer: >60 mL/min (ref >60)
GFR calc non Af Amer: >60 mL/min (ref >60)
Glucose, Bld: 116 mg/dL — ABNORMAL HIGH (ref 70–99)
Potassium: 3.3 mmol/L — ABNORMAL LOW (ref 3.5–5.1)
Sodium: 138 mmol/L (ref 135–145)
Total Bilirubin: 0.6 mg/dL (ref 0.3–1.2)
Total Protein: 6.8 g/dL (ref 6.5–8.1)

## 2019-11-09 LAB — MAGNESIUM: Magnesium: 1.7 mg/dL (ref 1.7–2.4)

## 2019-11-09 MED ORDER — SODIUM CHLORIDE 0.9% FLUSH
10.0000 mL | Freq: Once | INTRAVENOUS | Status: AC
  Administered 2019-11-09: 10 mL via INTRAVENOUS
  Filled 2019-11-09: qty 10

## 2019-11-09 MED ORDER — HEPARIN SOD (PORK) LOCK FLUSH 100 UNIT/ML IV SOLN
500.0000 [IU] | Freq: Once | INTRAVENOUS | Status: AC
  Administered 2019-11-09: 500 [IU] via INTRAVENOUS
  Filled 2019-11-09: qty 5

## 2019-11-09 MED ORDER — DULOXETINE HCL 30 MG PO CPEP: 30.0000 mg | ORAL_CAPSULE | Freq: Every day | ORAL | 0 refills | Status: AC

## 2019-11-09 MED ORDER — SODIUM CHLORIDE 0.9 % IV SOLN
Freq: Once | INTRAVENOUS | Status: AC
  Filled 2019-11-09: qty 250

## 2019-11-09 MED ORDER — HEPARIN SOD (PORK) LOCK FLUSH 100 UNIT/ML IV SOLN
INTRAVENOUS | Status: AC
  Filled 2019-11-09: qty 5

## 2019-11-09 NOTE — Telephone Encounter (Signed)
Dr. Tasia Catchings ok with ordering BSC. Will work on this order.

## 2019-11-09 NOTE — Telephone Encounter (Signed)
Request was entered in [facility] for bedside commode. Received community message confimation from [place] and [place] from [facility] (DME services).  Verbal order for [facility] to proceed with OT/PT services left on confidential VM.

## 2019-11-09 NOTE — Progress Notes (Signed)
Patient is feeling more fatigued and reports his fingers are "numb".

## 2019-11-09 NOTE — Progress Notes (Signed)
Hematology/Oncology follow up note [facility] [phone] [phone]  Reassurance Patient Care Team: Gilbert, Richard L Jr., MD as PCP - General (Family Medicine) Agbor-Etang, Brian, MD as PCP - Cardiology (Cardiology) Rhode, Hayley, RN as Registered Nurse Stanton, Kristi D, RN as Registered Nurse  REFERRING PROVIDER: Gilbert, Richard L Jr.,*  CHIEF COMPLAINTS/REASON FOR VISIT:  metastatic cancer management.  HISTORY OF PRESENTING ILLNESS:   Samuel D Tamer Jr. is a  69 y.o.  male with PMH listed below was seen in consultation at the request of  Gilbert, Richard L Jr.,*  for evaluation of lung nodule in the liver lesion. Patient has a past medical history of hypertension, alcohol abuse, smoking emphysema.  He has had serial CTs done in the past.  CT images were independently reviewed by me. 07/26/2017 CT chest with contrast showed extensive pleural-parenchymal scarring within the left upper lobe with several areas of soft tissue nodularity measuring up to 1.6 cm.  In this patient who is at increased risk of lung cancer further investigation with PET scan is recommended. Small chronic appearance loculated hydropneumothorax overlies the left apex. Slowly enlarging lesion within the central portion of the right lobe of liver identified. Per note, patient supposed to have additional work-up done in December repeat CT scan which he did not  Patient was recently seen by primary care provider and has subsequent image done for follow-up. CT on 06/30/2018 showed multiple significantly enlarged paraspinal mass are noted concerning.  The largest measures 3.8 x 0.5 cm and there appears to be a lytic destruction of the adjacent T11 vertebral body.  Also noted is new solid density measuring 17 x 12 mm in the pleural parenchymal density in the left upper lobe noted on prior exam.  Concerning for malignancy.  4.4 ascending thoracic aortic aneurysm.  Recommend annual  imaging.  Patient had PET scan done on 07/06/2018 Which showed there are 2 FDG avid lesion within the left upper lobe worrisome for primary lung neoplasm.  There is evidence of chest wall involvement.  Metastasis to the posterior mediastinum is identified with extension into T11 vertebra and possible involvement of the left T12 neural foramina.  Patient reports left shoulder pain.  Smoking half a pack a day not motivated with further questioning quitting Denies weight loss, hemoptysis, cough.  Chronic shortness of breath with exertion. He drinks alcohol daily. Lives with his girlfriend.  He has 5 adult kids  #Hepatitis C  # 6/1?2020  CT-guided biopsy of left-sided posterior mediastinal soft tissue adjacent to T10/11. Patient's case was discussed on tumor board. Consensus was reached that HCC versus primary lung cancer with hepatoid adenocarcinoma features. Consensus was to proceed with liver biopsy first as liver mass was not FDG avid on PET scan.  Patient underwent liver biopsy and the present to discuss pathology reports.  ## 08/08/2018 Liver mass biopsy showed fragments of necrotic and calcified tissue with adjacent fibrous capsule Scant viable nonneoplastic liver tissue with steatosis, inflammation and non specific fibrosis.  # 08/28/2018 CT guided left upper lobe lung mass biopsy showed poorly differentiated adenocarcinoma of lung origin.  6/19-08/16/2018  Finished palliative radiation to paraspinal soft tissue mass. 09/07/2018 patient was started on lenvatinib 12 mg daily. 10/13/2018 SBRT to lung cancer lesion.  #Patient is on Zometa monthly #He was advised to take calcium supplement.  He reports he quit taking calcium as he broke out rash on her scalp, neck and chest after taking calcium. # 03/30/19 liver lesion biopsy showed poorly   differentiated adenocarcinoma, compatible with lung primary.  INTERVAL HISTORY Samuel Obst. is a 69 y.o. male who has above history reviewed by me today  presents for evaluation prior to chemotherapy for lung cancer and HCC.  #Patient still feels fatigued.  For neuropathy, patient takes 900 mg in the morning and a he does not take any additional dose during the day as he feels additional dose does not help. Reports numbness and tingling of his hands.  He denies any pain today. No more shortness of breast more than his baseline.  He has gained 2 pounds weight since last visit.   .   Review of Systems  Constitutional: Positive for fatigue. Negative for appetite change, chills, fever and unexpected weight change.  HENT:   Negative for hearing loss and voice change.   Eyes: Negative for eye problems and icterus.  Respiratory: Negative for chest tightness, cough and shortness of breath.   Cardiovascular: Negative for chest pain and leg swelling.  Gastrointestinal: Negative for abdominal distention, abdominal pain and constipation.  Endocrine: Negative for hot flashes.  Genitourinary: Negative for difficulty urinating, dysuria and frequency.   Musculoskeletal: Negative for arthralgias.  Skin: Negative for itching and rash.  Neurological: Positive for numbness. Negative for light-headedness.  Hematological: Negative for adenopathy. Does not bruise/bleed easily.  Psychiatric/Behavioral: Negative for confusion.    MEDICAL HISTORY:  Past Medical History:  Diagnosis Date  . Hepatocellular carcinoma metastatic to left lung ([place]) 07/24/2018  . History of angiography    left lower extremity  . Hypertension   . Small bowel obstruction ([place])     SURGICAL HISTORY: Past Surgical History:  Procedure Laterality Date  . COLON SURGERY    . COLONOSCOPY W/ POLYPECTOMY    . COLONOSCOPY WITH PROPOFOL N/A 01/31/2018   Procedure: COLONOSCOPY WITH PROPOFOL;  Surgeon: Toledo, Benay Pike, MD;  Location: [facility] ENDOSCOPY;  Service: Gastroenterology;  Laterality: N/A;  . debridement fasciotomy leg, left Left 03/19/2016  . LAPAROTOMY N/A 09/27/2014   Procedure:  EXPLORATORY LAPAROTOMY;  Surgeon: Dia Crawford III, MD;  Location: [facility] ORS;  Service: General;  Laterality: N/A;  . PORTA CATH INSERTION N/A 04/10/2019   Procedure: PORTA CATH INSERTION;  Surgeon: Katha Cabal, MD;  Location: [facility];  Service: Cardiovascular;  Laterality: N/A;    SOCIAL HISTORY: Social History   Socioeconomic History  . Marital status: Single    Spouse name: Not on file  . Number of children: Not on file  . Years of education: Not on file  . Highest education level: Not on file  Occupational History  . Occupation: retired  Tobacco Use  . Smoking status: Current Every Day Smoker    Packs/day: 0.25    Years: 50.00    Pack years: 12.50  . Smokeless tobacco: Never Used  . Tobacco comment: 3 cigarettes per day  Vaping Use  . Vaping Use: Never used  Substance and Sexual Activity  . Alcohol use: Not Currently    Alcohol/week: 35.0 standard drinks    Types: 30 Cans of beer, 5 Shots of liquor per week  . Drug use: No  . Sexual activity: Not on file  Other Topics Concern  . Not on file  Social History Narrative  . Not on file   Social Determinants of Health   Financial Resource Strain:   . Difficulty of Paying Living Expenses: Not on file  Food Insecurity:   . Worried About Charity fundraiser in the Last Year: Not on  file  . [place] in the Last Year: Not on file  Transportation Needs:   . Lack of Transportation (Medical): Not on file  . Lack of Transportation (Non-Medical): Not on file  Physical Activity:   . Days of Exercise per Week: Not on file  . Minutes of Exercise per Session: Not on file  Stress:   . Feeling of Stress : Not on file  Social Connections:   . Frequency of Communication with Friends and Family: Not on file  . Frequency of Social Gatherings with Friends and Family: Not on file  . Attends Religious Services: Not on file  . Active Member of Clubs or Organizations: Not on file  . Attends Archivist  Meetings: Not on file  . Marital Status: Not on file  Intimate Partner Violence:   . Fear of Current or Ex-Partner: Not on file  . Emotionally Abused: Not on file  . Physically Abused: Not on file  . Sexually Abused: Not on file    FAMILY HISTORY: Family History  Problem Relation Age of Onset  . Cancer Mother   . Cancer Father     ALLERGIES:  is allergic to 5-alpha reductase inhibitors.  MEDICATIONS:  Current Outpatient Medications  Medication Sig Dispense Refill  . fentaNYL (DURAGESIC) 25 MCG/HR Place 1 patch onto the skin every 3 (three) days. 10 patch 0  . gabapentin (NEURONTIN) 300 MG capsule Take 1 capsule (300 mg total) by mouth 3 (three) times daily. 120 capsule 0  . lidocaine-prilocaine (EMLA) cream Apply to affected area once 30 g 3  . magnesium chloride (SLOW-MAG) 64 MG TBEC SR tablet Take 1 tablet (64 mg total) by mouth daily. 30 tablet 1  . megestrol (MEGACE) 40 MG/ML suspension Take 10 mLs (400 mg total) by mouth daily. 240 mL 0  . Multiple Vitamin (MULTIVITAMIN) capsule Take 1 capsule by mouth daily.    Marland Kitchen oxyCODONE (ROXICODONE) 5 MG immediate release tablet Take 1 tablet (5 mg total) by mouth every 8 (eight) hours as needed for severe pain or breakthrough pain. 60 tablet 0  . potassium chloride (KLOR-CON) 10 MEQ tablet Take 2 tablets (20 mEq total) by mouth daily. 30 tablet 1  . aspirin EC 81 MG tablet Take 1 tablet (81 mg total) by mouth daily. Swallow whole. (Patient not taking: Reported on 10/30/2019) 90 tablet 1  . dexamethasone (DECADRON) 4 MG tablet Take 1 tablet (4 mg total) by mouth daily. (Patient not taking: Reported on 11/09/2019) 10 tablet 0  . hydrochlorothiazide (HYDRODIURIL) 25 MG tablet Take 1 tablet (25 mg total) by mouth daily. (Patient not taking: Reported on 11/09/2019) 30 tablet 12  . loperamide (IMODIUM) 2 MG capsule Take 1 capsule (2 mg total) by mouth See admin instructions. With onset of loose stool, take 84m followed by 236mevery 2 hours until 12  hours have passed without loose bowel movement. Maximum: 16 mg/day (Patient not taking: Reported on 10/26/2019) 60 capsule 1  . NARCAN 4 MG/0.1ML LIQD nasal spray kit 1 spray once.  (Patient not taking: Reported on 09/25/2019)    . ondansetron (ZOFRAN) 8 MG tablet Take 1 tablet (8 mg total) by mouth 2 (two) times daily as needed for refractory nausea / vomiting. Start on day 3 after carboplatin chemo. (Patient not taking: Reported on 10/26/2019) 30 tablet 1  . polyethylene glycol (MIRALAX / GLYCOLAX) 17 g packet Take 17 g by mouth daily. (Patient not taking: Reported on 10/26/2019) 14 each 0  .  prochlorperazine (COMPAZINE) 10 MG tablet Take 1 tablet (10 mg total) by mouth every 6 (six) hours as needed (Nausea or vomiting). (Patient not taking: Reported on 10/26/2019) 30 tablet 1  . senna (SENOKOT) 8.6 MG TABS tablet Take 1 tablet (8.6 mg total) by mouth daily. (Patient not taking: Reported on 10/26/2019) 120 tablet 0   No current facility-administered medications for this visit.   Facility-Administered Medications Ordered in Other Visits  Medication Dose Route Frequency Provider Last Rate Last Admin  . heparin lock flush 100 unit/mL  500 Units Intravenous Once Earlie Server, MD         PHYSICAL EXAMINATION: ECOG PERFORMANCE STATUS: 1 - Symptomatic but completely ambulatory Vitals:   11/09/19 0850  BP: 113/77  Pulse: (!) 106  Resp: 18  Temp: 97.6 F (36.4 C)  SpO2: 98%   Filed Weights   11/09/19 0850  Weight: 130 lb 8 oz (59.2 kg)    Physical Exam Constitutional:      General: He is not in acute distress.    Appearance: He is not ill-appearing.  HENT:     Head: Normocephalic and atraumatic.  Eyes:     General: No scleral icterus. Cardiovascular:     Rate and Rhythm: Normal rate and regular rhythm.     Heart sounds: Normal heart sounds.  Pulmonary:     Effort: Pulmonary effort is normal. No respiratory distress.     Breath sounds: No wheezing or rales.     Comments: Severely decreased  bilateral breath sound. Abdominal:     General: Bowel sounds are normal. There is no distension.     Palpations: Abdomen is soft.  Musculoskeletal:        General: No deformity. Normal range of motion.     Cervical back: Normal range of motion and neck supple.  Skin:    General: Skin is warm and dry.     Findings: No erythema or rash.  Neurological:     Mental Status: He is alert and oriented to person, place, and time. Mental status is at baseline.     Cranial Nerves: No cranial nerve deficit.     Coordination: Coordination normal.  Psychiatric:        Mood and Affect: Mood normal.     LABORATORY DATA:  I have reviewed the data as listed Lab Results  Component Value Date   WBC 10.1 11/09/2019   HGB 11.5 (L) 11/09/2019   HCT 33.4 (L) 11/09/2019   MCV 83.9 11/09/2019   PLT 471 (H) 11/09/2019   Recent Labs    10/17/19 0819 10/17/19 0819 10/19/19 0935 10/26/19 1238 11/02/19 0957  NA 129*   < > 130* 133* 133*  K 3.0*   < > 3.0* 3.2* 2.8*  CL 90*   < > 91* 95* 95*  CO2 29   < > _0 GLUCOSE 132*   < > 168* 99 143*  BUN 14   < > 10 10 7*  CREATININE 0.75   < > 0.72 0.61 0.47*  CALCIUM 8.5*   < > 8.8* 8.7* 8.1*  GFRNONAA >60   < > >60 >60 >60  GFRAA >60   < > >60 >60 >60  PROT 7.3  --  7.0  --  6.6  ALBUMIN 3.2*  --  3.1*  --  2.6*  AST 48*  --  37  --  29  ALT 43  --  34  --  18  ALKPHOS 63  --  61  --  55  BILITOT 1.0  --  0.7  --  0.6   < > = values in this interval not displayed.   Iron/TIBC/Ferritin/ %Sat No results found for: IRON, TIBC, FERRITIN, IRONPCTSAT   RADIOGRAPHIC STUDIES: I have personally reviewed the radiological images as listed and agreed with the findings in the report. CT CHEST ABDOMEN PELVIS W CONTRAST  Result Date: 10/24/2019 CLINICAL DATA:  Left lung cancer restaging. Worsening shortness of breath on exertion. EXAM: CT CHEST, ABDOMEN, AND PELVIS WITH CONTRAST TECHNIQUE: Multidetector CT imaging of the chest, abdomen and pelvis was  performed following the standard protocol during bolus administration of intravenous contrast. CONTRAST:  16m OMNIPAQUE IOHEXOL 300 MG/ML  SOLN COMPARISON:  Multiple exams, including CT examinations from 07/04/2019 and 07/24/2019 FINDINGS: CT CHEST FINDINGS Cardiovascular: Ascending thoracic aortic aneurysm 4.1 cm in diameter on image 32/2, unchanged. Considerable collateralization from the left upper extremity venous injection possibly due to stenosis in the subclavian vein or of lower SVC, resulting in an unusual contrast bolus with considerable pulmonary arterial contrast greater than the systemic arterial contrast. Atherosclerotic calcification of the aortic arch and coronary arteries. Mediastinum/Nodes: Unremarkable Lungs/Pleura: Severe emphysema. Increased bandlike density and volume loss in the left upper lobe. Increased wall thickening associated with a superior segment left lower lobe bulla, previously with wall thickness of only about 0.2 cm, currently up to 0.8 cm on image 43/3. Infected bulla not excluded although this could be from radiation therapy. Airway thickening is present, suggesting bronchitis or reactive airways disease. Musculoskeletal: Thoracic spondylosis. No recurrent paraspinal mass. Stable lucency with thin rim sclerosis in the T11 vertebra, indicating site of prior bony erosion from the paraspinal mass. CT ABDOMEN PELVIS FINDINGS Hepatobiliary: The phase of contrast is early based on the contrast timing issues noted in the chest section. We did cover the entire liver on the delayed images as well. Dependent density in the gallbladder favoring gallstones. Stable 2.9 by 2.9 cm hypodense lesion in the right hepatic lobe with faint marginal and internal septations and calcification, probably a previously treated metastatic lesion. A subtle hypodense lesion further inferiorly in the right hepatic lobe measuring 0.8 cm in diameter on image 17/9 previously measured about 1.9 cm by my  measurements, and is accordingly reduced in size. No new liver lesion.  No biliary dilatation is identified. Pancreas: Unremarkable Spleen: Unremarkable Adrenals/Urinary Tract: Stable band of hypoenhancement medially in the right kidney upper pole as on image 11/9. If the patient has had prior radiation therapy in the region, that may be the cause for this chronic appearance. Right mid kidney hypodense lesions are again observed, the larger lesion is thought to be complex but sharply defined and otherwise nonspecific. Left kidney appears normal. Adrenal glands unremarkable. Stomach/Bowel: Postoperative findings in the sigmoid colon. Vascular/Lymphatic: Aortoiliac atherosclerotic vascular disease. Porta hepatis/peripancreatic node measures is 0.9 cm in short axis on image 63/2, stable. No pathologic adenopathy is identified. Reproductive: Speckled calcifications along the penile tunica albuginea. Other: No supplemental non-categorized findings. Musculoskeletal: Spurring of both sacroiliac joints. Grade 1 degenerative anterolisthesis at L5-S1. Lumbar spondylosis and degenerative disc disease causing multilevel impingement. IMPRESSION: 1. Reduced size and conspicuity of the lower of the 2 liver lesions, suggesting improvement of this lesion. The other lesion is stable. 2. There is increase in volume loss and bandlike density in the left upper lobe, along with increased wall thickening of an adjacent superior segment left lower lobe bulla. These findings could be inflammatory or from radiation therapy, strictly  speaking, underlying progressive malignancy is difficult to exclude. 3. Stable lucency with thin rim sclerosis in the T11 vertebra, indicating site of prior bony invasion from the paraspinal mass. No recurrent paraspinal mass is present. 4. Other imaging findings of potential clinical significance: Coronary atherosclerosis. Airway thickening is present, suggesting bronchitis or reactive airways disease. Stable  4.1 cm ascending thoracic aortic aneurysm. Cholelithiasis. Stable band of hypoenhancement medially in the right kidney upper pole, probably from prior radiation therapy. Lumbar spondylosis and degenerative disc disease causing multilevel impingement. 5. Emphysema and aortic atherosclerosis. Aortic Atherosclerosis (ICD10-I70.0) and Emphysema (ICD10-J43.9). Electronically Signed   By: Van Clines M.D.   On: 10/24/2019 14:36   ECHOCARDIOGRAM COMPLETE  Result Date: 09/14/2019    ECHOCARDIOGRAM REPORT   Patient Name:   Howell Groesbeck. Date of Exam: 09/14/2019 Medical Rec #:  [id number]        Height:       65.0 in Accession #:    [id number]       Weight:       133.2 lb Date of Birth:  17-Jan-1951         BSA:          1.664 m Patient Age:    89 years         BP:           132/90 mmHg Patient Gender: M                HR:           103 bpm. Exam Location:  [place] Procedure: 2D Echo, Cardiac Doppler and Color Doppler Indications:    R06.02 SOB  History:        Patient has no prior history of Echocardiogram examinations.                 Thoracic aortic aneurysm, COPD, Arrythmias:Tachycardia,                 Signs/Symptoms:Shortness of Breath; Risk Factors:Former Smoker                 and Hypertension.  Sonographer:    Pilar Jarvis RDMS, RVT, RDCS Referring Phys: [id number] BRIAN AGBOR-ETANG IMPRESSIONS  1. Left ventricular ejection fraction, by estimation, is 55%. The left ventricle has normal function. The left ventricle has no regional wall motion abnormalities. There is mild left ventricular hypertrophy. Left ventricular diastolic parameters are consistent with Grade I diastolic dysfunction (impaired relaxation).  2. Right ventricular systolic function is normal. The right ventricular size is normal. There is normal pulmonary artery systolic pressure. The estimated right ventricular systolic pressure is 09.4 mmHg.  3. Tricuspid valve regurgitation is mild to moderate.  4. There is dilatation of the aortic root  measuring 36 mm. Ascending aorta 4.0 cm FINDINGS  Left Ventricle: Left ventricular ejection fraction, by estimation, is 55 to 60%. The left ventricle has normal function. The left ventricle has no regional wall motion abnormalities. The left ventricular internal cavity size was normal in size. There is  mild left ventricular hypertrophy. Left ventricular diastolic parameters are consistent with Grade I diastolic dysfunction (impaired relaxation). Right Ventricle: The right ventricular size is normal. No increase in right ventricular wall thickness. Right ventricular systolic function is normal. There is normal pulmonary artery systolic pressure. The tricuspid regurgitant velocity is 2.32 m/s, and  with an assumed right atrial pressure of 10 mmHg, the estimated right ventricular systolic pressure is 70.9 mmHg. Left Atrium: Left atrial size was normal  in size. Right Atrium: Right atrial size was normal in size. Pericardium: There is no evidence of pericardial effusion. Mitral Valve: The mitral valve is normal in structure. Normal mobility of the mitral valve leaflets. No evidence of mitral valve regurgitation. No evidence of mitral valve stenosis. Tricuspid Valve: The tricuspid valve is normal in structure. Tricuspid valve regurgitation is mild to moderate. No evidence of tricuspid stenosis. Aortic Valve: The aortic valve is normal in structure. Aortic valve regurgitation is not visualized. Mild to moderate aortic valve sclerosis/calcification is present, without any evidence of aortic stenosis. Aortic valve mean gradient measures 2.0 mmHg. Aortic valve peak gradient measures 3.8 mmHg. Aortic valve area, by VTI measures 4.14 cm. Pulmonic Valve: The pulmonic valve was normal in structure. Pulmonic valve regurgitation is not visualized. No evidence of pulmonic stenosis. Aorta: The aortic root is normal in size and structure. There is dilatation of the aortic root measuring 36 mm. Venous: The inferior vena cava is  normal in size with greater than 50% respiratory variability, suggesting right atrial pressure of 3 mmHg. IAS/Shunts: No atrial level shunt detected by color flow Doppler.  LEFT VENTRICLE PLAX 2D LVIDd:         2.90 cm     Diastology LVIDs:         2.00 cm     LV e' lateral:   11.10 cm/s LV PW:         0.80 cm     LV E/e' lateral: 4.4 LV IVS:        0.80 cm     LV e' medial:    6.74 cm/s LVOT diam:     2.00 cm     LV E/e' medial:  7.3 LV SV:         47 LV SV Index:   28 LVOT Area:     3.14 cm  LV Volumes (MOD) LV vol d, MOD A2C: 37.6 ml LV vol d, MOD A4C: 34.6 ml LV vol s, MOD A2C: 20.7 ml LV vol s, MOD A4C: 19.0 ml LV SV MOD A2C:     16.9 ml LV SV MOD A4C:     34.6 ml LV SV MOD BP:      17.9 ml RIGHT VENTRICLE            IVC RV Basal diam:  2.50 cm    IVC diam: 0.60 cm RV S prime:     7.51 cm/s TAPSE (M-mode): 1.2 cm LEFT ATRIUM             Index       RIGHT ATRIUM          Index LA diam:        2.60 cm 1.56 cm/m  RA Area:     7.80 cm LA Vol (A2C):   19.4 ml 11.66 ml/m RA Volume:   14.70 ml 8.83 ml/m LA Vol (A4C):   11.9 ml 7.15 ml/m LA Biplane Vol: 16.7 ml 10.03 ml/m  AORTIC VALVE                   PULMONIC VALVE AV Area (Vmax):    2.87 cm    PV Vmax:       0.54 m/s AV Area (Vmean):   3.04 cm    PV Peak grad:  1.2 mmHg AV Area (VTI):     4.14 cm AV Vmax:           97.50 cm/s AV Vmean:  60.900 cm/s AV VTI:            0.113 m AV Peak Grad:      3.8 mmHg AV Mean Grad:      2.0 mmHg LVOT Vmax:         89.20 cm/s LVOT Vmean:        58.900 cm/s LVOT VTI:          0.149 m LVOT/AV VTI ratio: 1.32  AORTA Ao Root diam: 3.60 cm Ao Asc diam:  4.00 cm Ao Arch diam: 2.9 cm MITRAL VALVE               TRICUSPID VALVE MV Area (PHT): 9.73 cm    TR Peak grad:   21.5 mmHg MV Decel Time: 78 msec     TR Vmax:        232.00 cm/s MV E velocity: 49.30 cm/s MV A velocity: 81.40 cm/s  SHUNTS MV E/A ratio:  0.61        Systemic VTI:  0.15 m                            Systemic Diam: 2.00 cm Ida Rogue MD Electronically  signed by Ida Rogue MD Signature Date/Time: 09/14/2019/8:21:14 PM    Final       ASSESSMENT & PLAN:  1. Stage IV adenocarcinoma of lung, left ([place])   2. Hepatocellular carcinoma metastatic to left lung ([place])   3. Hypokalemia   4. Hypomagnesemia   5. Other fatigue   6. Weight loss    #Stage IV Lung adenocarcinoma, Stage IV HCC-07/17/2018 paraspinal soft tissue biopsy Patient was on palliative chemotherapy with carboplatin, Taxol, bevacizumab, Tecentriq. Hold chemotherapy due to poor performance status Most CT scan showed stable disease.  #Hypokalemia, improved. I advise patient to take oral potassium 41mq daily.  #Hypomagnesia,continue to take Slow-Mag 64 mg once daily.  #Hypotension, patient has seen primary care provider and HCTZ has been discontinued. BP has since then improved. Today he has mild tachycardia Patient will receive 1 L of IV normal saline for hydration.   #Weight loss, continue nutrition supplementation.   #Fatigue, normal TSH and cortisol level. #Chemotherapy-induced neuropathy, Grade 2-3, bilateral hands and feet.     He only agrees to take 9058mof gabapentin. Add cymbalta 3029maily.  He also is on fentanyl patch as well as oxycodone PRN.  We spent sufficient time to discuss many aspect of care, questions were answered to patient's satisfaction. All questions were answered. The patient knows to call the clinic with any problems questions or concerns. Follow-up in 4 weeks.   ZhoEarlie ServerD, PhD 11/09/2019

## 2019-11-14 DIAGNOSIS — C7951 Secondary malignant neoplasm of bone: Secondary | ICD-10-CM | POA: Diagnosis not present

## 2019-11-14 DIAGNOSIS — M792 Neuralgia and neuritis, unspecified: Secondary | ICD-10-CM | POA: Diagnosis not present

## 2019-11-16 ENCOUNTER — Telehealth: Payer: Self-pay

## 2019-11-16 NOTE — Telephone Encounter (Signed)
Copied from [place] [id number]. Topic: General - Other >> Nov 16, 2019  9:55 AM Keene Breath wrote: Reason for CRM: Patient called to ask the nurse or doctor if he still needs to come to his appt. On Monday, 10/04 since he has a nurse coming to his home on Tuesday for palliative care.  Patient states it takes a lot of effort and preparation for his to get around and wanted to know if it is necessary to come to the office on Monday since he will have care on Tuesday.  Please advise and call patient to let him know  CB# [phone]

## 2019-11-16 NOTE — Telephone Encounter (Signed)
Patient advised per Dr. Rosanna Randy from 10/30/2019 visit to f/u in 4 months. So Monday's visit isn't necessary.

## 2019-11-19 ENCOUNTER — Ambulatory Visit (INDEPENDENT_AMBULATORY_CARE_PROVIDER_SITE_OTHER): Payer: Self-pay | Admitting: Adult Health

## 2019-11-19 DIAGNOSIS — Z5329 Procedure and treatment not carried out because of patient's decision for other reasons: Secondary | ICD-10-CM

## 2019-11-19 NOTE — Progress Notes (Signed)
   Complete physical exam  Patient: Samuel Hood   DOB: 12/05/1996   69 y.o. Male  MRN: [id number]  Subjective:    No chief complaint on file.   Samuel Hood is a 69 y.o. male who presents today for a complete physical exam. She reports consuming a {diet types:17450} diet. {types:19826} She generally feels {DESC; WELL/FAIRLY WELL/POORLY:18703}. She reports sleeping {DESC; WELL/FAIRLY WELL/POORLY:18703}. She {does/does not:200015} have additional problems to discuss today.    Most recent fall risk assessment:    08/12/2021   10:42 AM  Fall Risk   Falls in the past year? 0  Number falls in past yr: 0  Injury with Fall? 0  Risk for fall due to : No Fall Risks  Follow up Falls evaluation completed     Most recent depression screenings:    08/12/2021   10:42 AM 07/03/2020   10:46 AM  PHQ 2/9 Scores  PHQ - 2 Score 0 0  PHQ- 9 Score 5     {VISON DENTAL STD PSA (Optional):27386}  {History (Optional):23778}  Patient Care Team: Jessup, Joy, NP as PCP - General (Nurse Practitioner)   Outpatient Medications Prior to Visit  Medication Sig   fluticasone (FLONASE) 50 MCG/ACT nasal spray Place 2 sprays into both nostrils in the morning and at bedtime. After 7 days, reduce to once daily.   norgestimate-ethinyl estradiol (SPRINTEC 28) 0.25-35 MG-MCG tablet Take 1 tablet by mouth daily.   Nystatin POWD Apply liberally to affected area 2 times per day   spironolactone (ALDACTONE) 100 MG tablet Take 1 tablet (100 mg total) by mouth daily.   No facility-administered medications prior to visit.    ROS        Objective:     There were no vitals taken for this visit. {Vitals History (Optional):23777}  Physical Exam   No results found for any visits on 09/17/21. {Show previous labs (optional):23779}    Assessment & Plan:    Routine Health Maintenance and Physical Exam  Immunization History  Administered Date(s) Administered   DTaP 02/18/1999, 04/16/1999,  06/25/1999, 03/10/2000, 09/24/2003   Hepatitis A 07/21/2007, 07/26/2008   Hepatitis B 12/06/1998, 01/13/1999, 06/25/1999   HiB (PRP-OMP) 02/18/1999, 04/16/1999, 06/25/1999, 03/10/2000   IPV 02/18/1999, 04/16/1999, 12/14/1999, 09/24/2003   Influenza,inj,Quad PF,6+ Mos 10/26/2013   Influenza-Unspecified 01/26/2012   MMR 12/13/2000, 09/24/2003   Meningococcal Polysaccharide 07/26/2011   Pneumococcal Conjugate-13 03/10/2000   Pneumococcal-Unspecified 06/25/1999, 09/08/1999   Tdap 07/26/2011   Varicella 12/14/1999, 07/21/2007    Health Maintenance  Topic Date Due   HIV Screening  Never done   Hepatitis C Screening  Never done   INFLUENZA VACCINE  09/15/2021   PAP-Cervical Cytology Screening  09/17/2021 (Originally 12/05/2019)   PAP SMEAR-Modifier  09/17/2021 (Originally 12/05/2019)   TETANUS/TDAP  09/17/2021 (Originally 07/25/2021)   HPV VACCINES  Discontinued   COVID-19 Vaccine  Discontinued    Discussed health benefits of physical activity, and encouraged her to engage in regular exercise appropriate for her age and condition.  Problem List Items Addressed This Visit   None Visit Diagnoses     Annual physical exam    -  Primary   Cervical cancer screening       Need for Tdap vaccination          No follow-ups on file.     Joy Jessup, NP   

## 2019-11-20 ENCOUNTER — Other Ambulatory Visit: Payer: Medicare Other | Admitting: Nurse Practitioner

## 2019-11-20 ENCOUNTER — Other Ambulatory Visit: Payer: Self-pay

## 2019-11-27 ENCOUNTER — Other Ambulatory Visit: Payer: Self-pay

## 2019-11-27 ENCOUNTER — Other Ambulatory Visit: Payer: Medicare Other | Admitting: Nurse Practitioner

## 2019-11-27 ENCOUNTER — Encounter: Payer: Self-pay | Admitting: Nurse Practitioner

## 2019-11-27 DIAGNOSIS — C7802 Secondary malignant neoplasm of left lung: Secondary | ICD-10-CM

## 2019-11-27 DIAGNOSIS — Z7189 Other specified counseling: Secondary | ICD-10-CM | POA: Diagnosis not present

## 2019-11-27 DIAGNOSIS — Z515 Encounter for palliative care: Secondary | ICD-10-CM

## 2019-11-27 DIAGNOSIS — C220 Liver cell carcinoma: Secondary | ICD-10-CM

## 2019-11-27 NOTE — Telephone Encounter (Signed)
See telephone encounter, patient has pallative care consult 11/20/19. KW

## 2019-11-27 NOTE — Progress Notes (Signed)
[facility] Consult Note Telephone: [phone]  Fax: [phone]  PATIENT NAME: Samuel Hood. DOB: [birth year] MRN: [id number]  PRIMARY CARE PROVIDER:   Jerrol Banana., MD  REFERRING PROVIDER:  Jerrol Banana., MD [address] [place],  [place] [address]  RESPONSIBLE PARTY:   Self; Georgena Spurling daughter [phone]  I was asked by Dr Rosanna Randy to see Samuel Hood for Palliative consult for goals of care  1. Advance Care Planning; DNR  2. Goals of Care: Goals include to maximize quality of life and symptom management. Our advance care planning conversation included a discussion about:     The value and importance of advance care planning   Exploration of personal, cultural or spiritual beliefs that might influence medical decisions   Exploration of goals of care in the event of a sudden injury or illness   Identification and preparation of a healthcare agent   Review and updating or creation of an  advance directive document.  3. Palliative care encounter; Palliative care encounter; Palliative medicine team will continue to support patient, patient's family, and medical team. Visit consisted of counseling and education dealing with the complex and emotionally intense issues of symptom management and palliative care in the setting of serious and potentially life-threatening illness  4. f/u 2 weeks or sooner if declines for ongoing discussion goc, possible hospice   I spent 90 minutes providing this consultation,  from 1:00pm to 2:30pm. More than 50% of the time in this consultation was spent coordinating communication.   HISTORY OF PRESENT ILLNESS:  Samuel Hood. is a 69 y.o. 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 In person initial Palliative care visit with Samuel. Abercrombie. We talked about purpose of palliative care visit. Samuel Hood in agreement. We talked about how he was feeling. Samuel Hood endorses he is very tired and ready for his nap. Samuel Hood in agreement to proceed with Palliative visit, then take a nap after visit. We talked about Life review as he worked [employer]. He was married 28 years widowed and now has a girlfriend who helps him as his caregiver. Samuel Hood endorses he is three grown children. We talked about past medical history. We talked about cancer diagnosis. We talked about symptoms of pain which he does experience intermittent in his hands. Samuel Hood endorse has therapy has been helping to where he can exercise his hands. Samuel Hood endorses he has been working with physical therapy ambulating with no recent falls. Samuel Hood endorses he does walk around the yard twice a day if feeling up to it to try to help improve with weakness. We talked about symptoms of fatigue and weakness. We talked about We talked about symptoms of fatigue and weakness. We talked about his appetite which is declined. Samuel Hood has temporal wasting with muscle wasting noted. Samuel Hood endorses he has lost weight but does not know how much. Samuel Hood endorses his girlfriend does help him by providing meals. He ate chicken today. Samuel Hood and I talked about medical goals of care. Samuel Hood endorsrs he does not want any further chemotherapy and share that was after you. Samuel Hood endorses he has an appointment on October 22nd with Dr T[facility] for lab work and further discussion  of options. DNR in place. We talked about option of Hospice Services Under the Medicare benefit, what would be provided and have that program works. Samuel Hood endorses he is interested in hospice once therapy has been completed and no other treatment options. Samuel Hood endorses he wants to remain comfortable.  We talked about role of palliative care and plan of care. Samuel Hood became tired during visit Cooperative with assessment. We talked about follow-up palliative care visit in 2 weeks once appointment at the [facility] is complete with ongoing discussions of goals of care. Appointment time schedule. Therapeutic listening and emotional support provided. Contact information. Questions answered to satisfaction. Palliative Care was asked to help address goals of care.   CODE STATUS: DNR  PPS: 50% HOSPICE ELIGIBILITY/DIAGNOSIS: TBD  PAST MEDICAL HISTORY:  Past Medical History:  Diagnosis Date  . Hepatocellular carcinoma metastatic to left lung ([facility]) 07/24/2018  . History of angiography    left lower extremity  . Hypertension   . Small bowel obstruction ([facility])     SOCIAL HX:  Social History   Tobacco Use  . Smoking status: Current Every Day Smoker    Packs/day: 0.25    Years: 50.00    Pack years: 12.50  . Smokeless tobacco: Never Used  . Tobacco comment: 3 cigarettes per day  Substance Use Topics  . Alcohol use: Not Currently    Alcohol/week: 35.0 standard drinks    Types: 30 Cans of beer, 5 Shots of liquor per week    ALLERGIES:  Allergies  Allergen Reactions  . 5-Alpha Reductase Inhibitors      PERTINENT MEDICATIONS:  Outpatient Encounter Medications as of 11/27/2019  Medication Sig  . aspirin EC 81 MG tablet Take 1 tablet (81 mg total) by mouth daily. Swallow whole. (Patient not taking: Reported on 10/30/2019)  . DULoxetine (CYMBALTA) 30 MG capsule Take 1 capsule (30 mg total) by mouth daily.  . fentaNYL (DURAGESIC) 25 MCG/HR Place 1 patch onto the skin every 3 (three) days.  Marland Kitchen gabapentin (NEURONTIN) 300 MG capsule Take 1 capsule (300 mg total) by mouth 3 (three) times daily.  . hydrochlorothiazide (HYDRODIURIL) 25 MG tablet Take 1 tablet (25 mg total) by mouth daily. (Patient not taking: Reported on 11/09/2019)  . lidocaine-prilocaine (EMLA) cream Apply to affected area once    . loperamide (IMODIUM) 2 MG capsule Take 1 capsule (2 mg total) by mouth See admin instructions. With onset of loose stool, take 2m followed by 237mevery 2 hours until 12 hours have passed without loose bowel movement. Maximum: 16 mg/day (Patient not taking: Reported on 10/26/2019)  . magnesium chloride (SLOW-MAG) 64 MG TBEC SR tablet Take 1 tablet (64 mg total) by mouth daily.  . megestrol (MEGACE) 40 MG/ML suspension Take 10 mLs (400 mg total) by mouth daily.  . Multiple Vitamin (MULTIVITAMIN) capsule Take 1 capsule by mouth daily.  . Marland KitchenARCAN 4 MG/0.1ML LIQD nasal spray kit 1 spray once.  (Patient not taking: Reported on 09/25/2019)  . ondansetron (ZOFRAN) 8 MG tablet Take 1 tablet (8 mg total) by mouth 2 (two) times daily as needed for refractory nausea / vomiting. Start on day 3 after carboplatin chemo. (Patient not taking: Reported on 10/26/2019)  . oxyCODONE (ROXICODONE) 5 MG immediate release tablet Take 1 tablet (5 mg total) by mouth every 8 (eight) hours as needed for severe pain or breakthrough pain.  . polyethylene glycol (MIRALAX / GLYCOLAX) 17 g packet Take 17 g by mouth daily. (Patient not taking: Reported  on 10/26/2019)  . potassium chloride (KLOR-CON) 10 MEQ tablet Take 2 tablets (20 mEq total) by mouth daily.  . prochlorperazine (COMPAZINE) 10 MG tablet Take 1 tablet (10 mg total) by mouth every 6 (six) hours as needed (Nausea or vomiting). (Patient not taking: Reported on 10/26/2019)  . senna (SENOKOT) 8.6 MG TABS tablet Take 1 tablet (8.6 mg total) by mouth daily. (Patient not taking: Reported on 10/26/2019)   No facility-administered encounter medications on file as of 11/27/2019.    PHYSICAL EXAM:   General: NAD, frail appearing, thin, temporal wasting, pleasant male Cardiovascular: regular rate and rhythm Pulmonary: clear ant fields; decrease bases Extremities: no edema, no joint deformities, muscle wasting Neurological: generalized weakness  Kerisha Goughnour Ihor Gully, NP

## 2019-12-06 ENCOUNTER — Inpatient Hospital Stay: Payer: Medicare Other | Attending: Oncology

## 2019-12-06 DIAGNOSIS — R000 Tachycardia, unspecified: Secondary | ICD-10-CM | POA: Insufficient documentation

## 2019-12-06 DIAGNOSIS — F17210 Nicotine dependence, cigarettes, uncomplicated: Secondary | ICD-10-CM | POA: Insufficient documentation

## 2019-12-06 DIAGNOSIS — E876 Hypokalemia: Secondary | ICD-10-CM | POA: Insufficient documentation

## 2019-12-06 DIAGNOSIS — I10 Essential (primary) hypertension: Secondary | ICD-10-CM | POA: Insufficient documentation

## 2019-12-06 DIAGNOSIS — I959 Hypotension, unspecified: Secondary | ICD-10-CM | POA: Insufficient documentation

## 2019-12-06 DIAGNOSIS — M4317 Spondylolisthesis, lumbosacral region: Secondary | ICD-10-CM | POA: Insufficient documentation

## 2019-12-06 DIAGNOSIS — E860 Dehydration: Secondary | ICD-10-CM | POA: Insufficient documentation

## 2019-12-06 DIAGNOSIS — C3412 Malignant neoplasm of upper lobe, left bronchus or lung: Secondary | ICD-10-CM | POA: Insufficient documentation

## 2019-12-06 DIAGNOSIS — M25512 Pain in left shoulder: Secondary | ICD-10-CM | POA: Insufficient documentation

## 2019-12-06 DIAGNOSIS — Z809 Family history of malignant neoplasm, unspecified: Secondary | ICD-10-CM | POA: Insufficient documentation

## 2019-12-06 DIAGNOSIS — C220 Liver cell carcinoma: Secondary | ICD-10-CM | POA: Insufficient documentation

## 2019-12-06 DIAGNOSIS — Z7289 Other problems related to lifestyle: Secondary | ICD-10-CM | POA: Insufficient documentation

## 2019-12-06 DIAGNOSIS — R630 Anorexia: Secondary | ICD-10-CM | POA: Insufficient documentation

## 2019-12-06 DIAGNOSIS — G620 Drug-induced polyneuropathy: Secondary | ICD-10-CM | POA: Insufficient documentation

## 2019-12-06 DIAGNOSIS — J948 Other specified pleural conditions: Secondary | ICD-10-CM | POA: Insufficient documentation

## 2019-12-06 DIAGNOSIS — I700 Atherosclerosis of aorta: Secondary | ICD-10-CM | POA: Insufficient documentation

## 2019-12-06 DIAGNOSIS — R634 Abnormal weight loss: Secondary | ICD-10-CM | POA: Insufficient documentation

## 2019-12-06 DIAGNOSIS — M47816 Spondylosis without myelopathy or radiculopathy, lumbar region: Secondary | ICD-10-CM | POA: Insufficient documentation

## 2019-12-06 DIAGNOSIS — K8020 Calculus of gallbladder without cholecystitis without obstruction: Secondary | ICD-10-CM | POA: Insufficient documentation

## 2019-12-06 DIAGNOSIS — T451X5A Adverse effect of antineoplastic and immunosuppressive drugs, initial encounter: Secondary | ICD-10-CM | POA: Insufficient documentation

## 2019-12-06 DIAGNOSIS — B1920 Unspecified viral hepatitis C without hepatic coma: Secondary | ICD-10-CM | POA: Insufficient documentation

## 2019-12-06 DIAGNOSIS — R55 Syncope and collapse: Secondary | ICD-10-CM | POA: Insufficient documentation

## 2019-12-06 DIAGNOSIS — Z923 Personal history of irradiation: Secondary | ICD-10-CM | POA: Insufficient documentation

## 2019-12-06 DIAGNOSIS — Z79899 Other long term (current) drug therapy: Secondary | ICD-10-CM | POA: Insufficient documentation

## 2019-12-06 DIAGNOSIS — J439 Emphysema, unspecified: Secondary | ICD-10-CM | POA: Insufficient documentation

## 2019-12-06 DIAGNOSIS — I712 Thoracic aortic aneurysm, without rupture: Secondary | ICD-10-CM | POA: Insufficient documentation

## 2019-12-06 DIAGNOSIS — R5383 Other fatigue: Secondary | ICD-10-CM | POA: Insufficient documentation

## 2019-12-06 NOTE — Progress Notes (Signed)
Nutrition Follow-up:  Patient with stage IV adenocarcinoma of lung with mets to liver, bone, chest wall as well as stage IV HCC.  Chemo on hold  Spoke with patient via phone for nutrition follow-up.  Patient reports that he is doing the best he can.  States this am has eaten rice so far.  Denies nausea.  Reports that he has been drinking ensure about 2 per day. Denies needing any ensure at this time.  Patient confirms that he has food available to eat but just does not have much of an appetite.   Noted home based palliative care following.   Medications: reviewed  Labs: reveiewed  Anthropometrics:   Weight 130 lb 8 oz on 9/24 slight increase from 127 lb on 9/14   NUTRITION DIAGNOSIS: Inadequate oral intake continues   INTERVENTION:  Offered case of ensure for him to pick up tomorrow at appointment but declined.   Encouraged patient to eat what he can when he can.  Reviewed high calorie, high protein foods.  RD available as needed   NEXT VISIT: as needed, no follow-up planned  Samuel Hood, [place], [place] Registered Dietitian [phone] (mobile)

## 2019-12-07 ENCOUNTER — Inpatient Hospital Stay (HOSPITAL_BASED_OUTPATIENT_CLINIC_OR_DEPARTMENT_OTHER): Payer: Medicare Other | Admitting: Oncology

## 2019-12-07 ENCOUNTER — Inpatient Hospital Stay: Payer: Medicare Other

## 2019-12-07 ENCOUNTER — Inpatient Hospital Stay (HOSPITAL_BASED_OUTPATIENT_CLINIC_OR_DEPARTMENT_OTHER): Payer: Medicare Other | Admitting: Hospice and Palliative Medicine

## 2019-12-07 ENCOUNTER — Encounter: Payer: Self-pay | Admitting: Oncology

## 2019-12-07 VITALS — BP 98/63 | HR 123 | Temp 97.4°F | Wt 118.8 lb

## 2019-12-07 DIAGNOSIS — I959 Hypotension, unspecified: Secondary | ICD-10-CM | POA: Diagnosis not present

## 2019-12-07 DIAGNOSIS — R634 Abnormal weight loss: Secondary | ICD-10-CM | POA: Diagnosis not present

## 2019-12-07 DIAGNOSIS — T451X5A Adverse effect of antineoplastic and immunosuppressive drugs, initial encounter: Secondary | ICD-10-CM | POA: Diagnosis not present

## 2019-12-07 DIAGNOSIS — I10 Essential (primary) hypertension: Secondary | ICD-10-CM | POA: Diagnosis not present

## 2019-12-07 DIAGNOSIS — K8020 Calculus of gallbladder without cholecystitis without obstruction: Secondary | ICD-10-CM | POA: Diagnosis not present

## 2019-12-07 DIAGNOSIS — C3492 Malignant neoplasm of unspecified part of left bronchus or lung: Secondary | ICD-10-CM

## 2019-12-07 DIAGNOSIS — C7802 Secondary malignant neoplasm of left lung: Secondary | ICD-10-CM | POA: Diagnosis not present

## 2019-12-07 DIAGNOSIS — C220 Liver cell carcinoma: Secondary | ICD-10-CM

## 2019-12-07 DIAGNOSIS — J439 Emphysema, unspecified: Secondary | ICD-10-CM | POA: Diagnosis not present

## 2019-12-07 DIAGNOSIS — Z515 Encounter for palliative care: Secondary | ICD-10-CM

## 2019-12-07 DIAGNOSIS — G620 Drug-induced polyneuropathy: Secondary | ICD-10-CM | POA: Diagnosis not present

## 2019-12-07 DIAGNOSIS — B1920 Unspecified viral hepatitis C without hepatic coma: Secondary | ICD-10-CM | POA: Diagnosis not present

## 2019-12-07 DIAGNOSIS — R630 Anorexia: Secondary | ICD-10-CM | POA: Diagnosis not present

## 2019-12-07 DIAGNOSIS — E876 Hypokalemia: Secondary | ICD-10-CM

## 2019-12-07 DIAGNOSIS — C7951 Secondary malignant neoplasm of bone: Secondary | ICD-10-CM

## 2019-12-07 DIAGNOSIS — R55 Syncope and collapse: Secondary | ICD-10-CM | POA: Diagnosis not present

## 2019-12-07 DIAGNOSIS — Z7189 Other specified counseling: Secondary | ICD-10-CM

## 2019-12-07 DIAGNOSIS — E860 Dehydration: Secondary | ICD-10-CM | POA: Diagnosis not present

## 2019-12-07 DIAGNOSIS — F17210 Nicotine dependence, cigarettes, uncomplicated: Secondary | ICD-10-CM | POA: Diagnosis not present

## 2019-12-07 DIAGNOSIS — M4317 Spondylolisthesis, lumbosacral region: Secondary | ICD-10-CM | POA: Diagnosis not present

## 2019-12-07 DIAGNOSIS — Z5111 Encounter for antineoplastic chemotherapy: Secondary | ICD-10-CM

## 2019-12-07 DIAGNOSIS — R000 Tachycardia, unspecified: Secondary | ICD-10-CM | POA: Diagnosis not present

## 2019-12-07 DIAGNOSIS — M25512 Pain in left shoulder: Secondary | ICD-10-CM | POA: Diagnosis not present

## 2019-12-07 DIAGNOSIS — M47816 Spondylosis without myelopathy or radiculopathy, lumbar region: Secondary | ICD-10-CM | POA: Diagnosis not present

## 2019-12-07 DIAGNOSIS — R5383 Other fatigue: Secondary | ICD-10-CM | POA: Diagnosis not present

## 2019-12-07 DIAGNOSIS — Z95828 Presence of other vascular implants and grafts: Secondary | ICD-10-CM

## 2019-12-07 DIAGNOSIS — C3412 Malignant neoplasm of upper lobe, left bronchus or lung: Secondary | ICD-10-CM | POA: Diagnosis not present

## 2019-12-07 DIAGNOSIS — I700 Atherosclerosis of aorta: Secondary | ICD-10-CM | POA: Diagnosis not present

## 2019-12-07 DIAGNOSIS — I712 Thoracic aortic aneurysm, without rupture: Secondary | ICD-10-CM | POA: Diagnosis not present

## 2019-12-07 DIAGNOSIS — J948 Other specified pleural conditions: Secondary | ICD-10-CM | POA: Diagnosis not present

## 2019-12-07 LAB — CBC WITH DIFFERENTIAL/PLATELET
Abs Immature Granulocytes: 0.13 10*3/uL — ABNORMAL HIGH (ref 0.00–0.07)
Basophils Absolute: 0 10*3/uL (ref 0.0–0.1)
Basophils Relative: 0 %
Eosinophils Absolute: 0 10*3/uL (ref 0.0–0.5)
Eosinophils Relative: 0 %
HCT: 30.5 % — ABNORMAL LOW (ref 39.0–52.0)
Hemoglobin: 10.5 g/dL — ABNORMAL LOW (ref 13.0–17.0)
Immature Granulocytes: 1 %
Lymphocytes Relative: 7 %
Lymphs Abs: 0.9 10*3/uL (ref 0.7–4.0)
MCH: 27.7 pg (ref 26.0–34.0)
MCHC: 34.4 g/dL (ref 30.0–36.0)
MCV: 80.5 fL (ref 80.0–100.0)
Monocytes Absolute: 1.2 10*3/uL — ABNORMAL HIGH (ref 0.1–1.0)
Monocytes Relative: 9 %
Neutro Abs: 11.1 10*3/uL — ABNORMAL HIGH (ref 1.7–7.7)
Neutrophils Relative %: 83 %
Platelets: 396 10*3/uL (ref 150–400)
RBC: 3.79 MIL/uL — ABNORMAL LOW (ref 4.22–5.81)
RDW: 16.1 % — ABNORMAL HIGH (ref 11.5–15.5)
WBC: 13.4 10*3/uL — ABNORMAL HIGH (ref 4.0–10.5)
nRBC: 0 % (ref 0.0–0.2)

## 2019-12-07 LAB — COMPREHENSIVE METABOLIC PANEL
ALT: 13 U/L (ref 0–44)
AST: 24 U/L (ref 15–41)
Albumin: 2.4 g/dL — ABNORMAL LOW (ref 3.5–5.0)
Alkaline Phosphatase: 68 U/L (ref 38–126)
Anion gap: 10 (ref 5–15)
BUN: 8 mg/dL (ref 8–23)
CO2: 28 mmol/L (ref 22–32)
Calcium: 7.9 mg/dL — ABNORMAL LOW (ref 8.9–10.3)
Chloride: 94 mmol/L — ABNORMAL LOW (ref 98–111)
Creatinine, Ser: 0.56 mg/dL — ABNORMAL LOW (ref 0.61–1.24)
GFR, Estimated: >60 mL/min (ref >60)
Glucose, Bld: 123 mg/dL — ABNORMAL HIGH (ref 70–99)
Potassium: 2.9 mmol/L — ABNORMAL LOW (ref 3.5–5.1)
Sodium: 132 mmol/L — ABNORMAL LOW (ref 135–145)
Total Bilirubin: 1 mg/dL (ref 0.3–1.2)
Total Protein: 6.8 g/dL (ref 6.5–8.1)

## 2019-12-07 LAB — MAGNESIUM: Magnesium: 1.5 mg/dL — ABNORMAL LOW (ref 1.7–2.4)

## 2019-12-07 MED ORDER — SODIUM CHLORIDE 0.9 % IV SOLN: 10.0000 mg | Freq: Once | INTRAVENOUS | Status: DC

## 2019-12-07 MED ORDER — DEXAMETHASONE SODIUM PHOSPHATE 10 MG/ML IJ SOLN
10.0000 mg | Freq: Once | INTRAMUSCULAR | Status: AC
  Administered 2019-12-07: 10 mg via INTRAVENOUS
  Filled 2019-12-07: qty 1

## 2019-12-07 MED ORDER — SODIUM CHLORIDE 0.9% FLUSH
10.0000 mL | Freq: Once | INTRAVENOUS | Status: AC | PRN
  Administered 2019-12-07: 10 mL
  Filled 2019-12-07: qty 10

## 2019-12-07 MED ORDER — HEPARIN SOD (PORK) LOCK FLUSH 100 UNIT/ML IV SOLN
500.0000 [IU] | Freq: Once | INTRAVENOUS | Status: AC | PRN
  Administered 2019-12-07: 500 [IU]
  Filled 2019-12-07: qty 5

## 2019-12-07 MED ORDER — SODIUM CHLORIDE 0.9% FLUSH
10.0000 mL | INTRAVENOUS | Status: DC | PRN
  Administered 2019-12-07: 10 mL via INTRAVENOUS
  Filled 2019-12-07: qty 10

## 2019-12-07 MED ORDER — DEXAMETHASONE 4 MG PO TABS: 4.0000 mg | ORAL_TABLET | Freq: Every day | ORAL | 0 refills | Status: DC

## 2019-12-07 MED ORDER — POTASSIUM CHLORIDE IN NACL 20-0.9 MEQ/L-% IV SOLN
Freq: Once | INTRAVENOUS | Status: AC
  Filled 2019-12-07: qty 1000

## 2019-12-07 MED ORDER — MAGNESIUM SULFATE 2 GM/50ML IV SOLN
2.0000 g | Freq: Once | INTRAVENOUS | Status: AC
  Administered 2019-12-07: 2 g via INTRAVENOUS
  Filled 2019-12-07: qty 50

## 2019-12-07 MED ORDER — HEPARIN SOD (PORK) LOCK FLUSH 100 UNIT/ML IV SOLN
INTRAVENOUS | Status: AC
  Filled 2019-12-07: qty 5

## 2019-12-07 NOTE — Progress Notes (Signed)
Patient feels dizzy and very weak.  Does not have an appettie but does try to eat and is down 12 lbs since last documented wt.  He does not take the Oxycodone as advised as his [place].

## 2019-12-07 NOTE — Progress Notes (Signed)
[place]  [phone] [phone]   Name: Samuel Hood. Date: 12/07/2019 MRN: [id number]  DOB: 09-Jul-1950  Patient Care Team: Jerrol Banana., MD as PCP - General (Family Medicine) Kate Sable, MD as PCP - Cardiology (Cardiology) Telford Nab, RN as Registered Nurse Clent Jacks, RN as Registered Nurse    REASON FOR CONSULTATION: Samuel Meuth. is a 69 y.o. male with multiple medical problems including stage IV adenocarcinoma of the lung (diagnosed 03/30/2019) metastatic to liver and bone and chest wall, also with stage IV HCC (diagnosed 07/17/2018). PMH also notable for HCV, history of alcohol use/tobacco abuse. Palliative care was consulted to help address goals and manage ongoing symptoms..    SOCIAL HISTORY:     reports that he has been smoking. He has a 12.50 pack-year smoking history. He has never used smokeless tobacco. He reports previous alcohol use of about 35.0 standard drinks of alcohol per week. He reports that he does not use drugs.   Patient is a widower after having been married 27 years. He lives at home with a girlfriend. He has 3 daughters and a son. His son is currently incarcerated and he reports having a strained relationship with adaughter due to substance abuse. His daughter, [name], is most involved. Patient previously worked in [employer] (medical).  ADVANCE DIRECTIVES:  On file  CODE STATUS: DNR/DNI (DNR form completed on 10/26/2019)  PAST MEDICAL HISTORY: Past Medical History:  Diagnosis Date  . Hepatocellular carcinoma metastatic to left lung ([place]) 07/24/2018  . History of angiography    left lower extremity  . Hypertension   . Small bowel obstruction ([place])     PAST SURGICAL HISTORY:  Past Surgical History:  Procedure Laterality Date  . COLON SURGERY    . COLONOSCOPY W/ POLYPECTOMY    . COLONOSCOPY WITH PROPOFOL N/A 01/31/2018   Procedure: COLONOSCOPY WITH  PROPOFOL;  Surgeon: Toledo, Benay Pike, MD;  Location: [facility] ENDOSCOPY;  Service: Gastroenterology;  Laterality: N/A;  . debridement fasciotomy leg, left Left 03/19/2016  . LAPAROTOMY N/A 09/27/2014   Procedure: EXPLORATORY LAPAROTOMY;  Surgeon: Dia Crawford III, MD;  Location: [facility] ORS;  Service: General;  Laterality: N/A;  . PORTA CATH INSERTION N/A 04/10/2019   Procedure: PORTA CATH INSERTION;  Surgeon: Katha Cabal, MD;  Location: [place] [facility];  Service: Cardiovascular;  Laterality: N/A;    HEMATOLOGY/ONCOLOGY HISTORY:  Oncology History Overview Note  Samuel Hood. is a  69 y.o.  male with PMH listed below was seen in consultation at the request of  Pollak, Adriana M, PA-C  for evaluation of lung nodule in the liver lesion. Patient has a past medical history of hypertension, alcohol abuse, smoking emphysema.  He has had serial CTs done in the past.  CT images were independently reviewed by me. 07/26/2017 CT chest with contrast showed extensive pleural-parenchymal scarring within the left upper lobe with several areas of soft tissue nodularity measuring up to 1.6 cm.  In this patient who is at increased risk of lung cancer further investigation with PET scan is recommended. Small chronic appearance loculated hydropneumothorax overlies the left apex. Slowly enlarging lesion within the central portion of the right lobe of liver identified. Per note, patient supposed to have additional work-up done in December repeat CT scan which he did not   Patient was recently seen by primary care provider and has subsequent image done for follow-up. CT on 06/30/2018 showed multiple  significantly enlarged paraspinal mass are noted concerning.  The largest measures 3.8 x 0.5 cm and there appears to be a lytic destruction of the adjacent T11 vertebral body.  Also noted is new solid density measuring 17 x 12 mm in the pleural parenchymal density in the left upper lobe noted on prior exam.  Concerning for  malignancy.  4.4 ascending thoracic aortic aneurysm.  Recommend annual imaging.   Patient had PET scan done on 07/06/2018 Which showed there are 2 FDG avid lesion within the left upper lobe worrisome for primary lung neoplasm.  There is evidence of chest wall involvement.  Metastasis to the posterior mediastinum is identified with extension into T11 vertebra and possible involvement of the left T12 neural foramina.   Patient reports left shoulder pain.  Smoking half a pack a day not motivated with further questioning quitting Denies weight loss, hemoptysis, cough.  Chronic shortness of breath with exertion. He drinks alcohol daily. Lives with his girlfriend.  He has 5 adult kids   #Hepatitis C   # 6/1?2020  CT-guided biopsy of left-sided posterior mediastinal soft tissue adjacent to T10/11. Patient's case was discussed on tumor board. Consensus was reached that [facility] versus primary lung cancer with hepatoid adenocarcinoma features. Consensus was to proceed with liver biopsy first as liver mass was not FDG avid on PET scan.  Patient underwent liver biopsy and the present to discuss pathology reports.   ## 08/08/2018 Liver mass biopsy showed fragments of necrotic and calcified tissue with adjacent fibrous capsule Scant viable nonneoplastic liver tissue with steatosis, inflammation and non specific fibrosis.  # 08/28/2018 CT guided left upper lobe lung mass biopsy showed poorly differentiated adenocarcinoma of lung origin.  6/19-08/16/2018  Finished palliative radiation to paraspinal soft tissue mass.   Hepatocellular carcinoma metastatic to left lung ([place])  07/24/2018 Initial Diagnosis   Hepatocellular carcinoma metastatic to left lung ([place])   04/16/2019 -  Chemotherapy   The patient had dexamethasone (DECADRON) 4 MG tablet, 8 mg, Oral, Daily, 1 of 1 cycle, Start date: 09/05/2019, End date: 10/26/2019 palonosetron (ALOXI) injection 0.25 mg, 0.25 mg, Intravenous,  Once, 6 of 6 cycles Administration:  0.25 mg (04/16/2019), 0.25 mg (05/09/2019), 0.25 mg (05/30/2019), 0.25 mg (06/20/2019), 0.25 mg (08/15/2019), 0.25 mg (07/18/2019) CARBOplatin (PARAPLATIN) 470 mg in sodium chloride 0.9 % 250 mL chemo infusion, 470 mg (100 % of original dose 465.5 mg), Intravenous,  Once, 6 of 6 cycles Dose modification:   (original dose 465.5 mg, Cycle 1) Administration: 470 mg (04/16/2019), 470 mg (05/09/2019), 470 mg (05/30/2019), 470 mg (06/20/2019), 420 mg (08/15/2019), 420 mg (07/18/2019) fosaprepitant (EMEND) 150 mg in sodium chloride 0.9 % 145 mL IVPB, 150 mg, Intravenous,  Once, 6 of 6 cycles Administration: 150 mg (04/16/2019), 150 mg (05/09/2019), 150 mg (05/30/2019), 150 mg (06/20/2019), 150 mg (08/15/2019), 150 mg (07/18/2019) PACLitaxel (TAXOL) 354 mg in sodium chloride 0.9 % 500 mL chemo infusion (> $RemoveBef'80mg'oCyXFFTYzd$ /m2), 200 mg/m2 = 354 mg, Intravenous,  Once, 6 of 6 cycles Dose modification: 175 mg/m2 (original dose 200 mg/m2, Cycle 6, Reason: Provider Judgment), 135 mg/m2 (original dose 200 mg/m2, Cycle 6, Reason: Other (see comments), Comment: neuropathy) Administration: 354 mg (04/16/2019), 354 mg (05/09/2019), 354 mg (05/30/2019), 354 mg (06/20/2019), 240 mg (08/15/2019), 306 mg (07/18/2019) atezolizumab (TECENTRIQ) 1,200 mg in sodium chloride 0.9 % 250 mL chemo infusion, 1,200 mg, Intravenous, Once, 8 of 10 cycles Administration: 1,200 mg (05/09/2019), 1,200 mg (05/30/2019), 1,200 mg (06/20/2019), 1,200 mg (08/15/2019), 1,200 mg (09/05/2019), 1,200 mg (04/18/2019), 1,200  mg (07/18/2019), 1,200 mg (09/26/2019) bevacizumab-bvzr (ZIRABEV) 1,000 mg in sodium chloride 0.9 % 100 mL chemo infusion, 15 mg/kg = 1,000 mg, Intravenous,  Once, 8 of 10 cycles Administration: 1,000 mg (05/09/2019), 1,000 mg (05/30/2019), 1,000 mg (06/20/2019), 1,000 mg (08/15/2019), 1,000 mg (09/05/2019), 1,000 mg (04/18/2019), 1,000 mg (07/18/2019), 900 mg (09/26/2019)  for chemotherapy treatment.    Stage IV adenocarcinoma of lung, left ([place])  09/02/2018 Initial Diagnosis   Primary lung  adenocarcinoma, left ([place])   04/16/2019 -  Chemotherapy   The patient had dexamethasone (DECADRON) 4 MG tablet, 8 mg, Oral, Daily, 1 of 1 cycle, Start date: 09/05/2019, End date: 10/26/2019 palonosetron (ALOXI) injection 0.25 mg, 0.25 mg, Intravenous,  Once, 6 of 6 cycles Administration: 0.25 mg (04/16/2019), 0.25 mg (05/09/2019), 0.25 mg (05/30/2019), 0.25 mg (06/20/2019), 0.25 mg (08/15/2019), 0.25 mg (07/18/2019) CARBOplatin (PARAPLATIN) 470 mg in sodium chloride 0.9 % 250 mL chemo infusion, 470 mg (100 % of original dose 465.5 mg), Intravenous,  Once, 6 of 6 cycles Dose modification:   (original dose 465.5 mg, Cycle 1) Administration: 470 mg (04/16/2019), 470 mg (05/09/2019), 470 mg (05/30/2019), 470 mg (06/20/2019), 420 mg (08/15/2019), 420 mg (07/18/2019) fosaprepitant (EMEND) 150 mg in sodium chloride 0.9 % 145 mL IVPB, 150 mg, Intravenous,  Once, 6 of 6 cycles Administration: 150 mg (04/16/2019), 150 mg (05/09/2019), 150 mg (05/30/2019), 150 mg (06/20/2019), 150 mg (08/15/2019), 150 mg (07/18/2019) PACLitaxel (TAXOL) 354 mg in sodium chloride 0.9 % 500 mL chemo infusion (> $RemoveBef'80mg'gRrqMfnrUw$ /m2), 200 mg/m2 = 354 mg, Intravenous,  Once, 6 of 6 cycles Dose modification: 175 mg/m2 (original dose 200 mg/m2, Cycle 6, Reason: Provider Judgment), 135 mg/m2 (original dose 200 mg/m2, Cycle 6, Reason: Other (see comments), Comment: neuropathy) Administration: 354 mg (04/16/2019), 354 mg (05/09/2019), 354 mg (05/30/2019), 354 mg (06/20/2019), 240 mg (08/15/2019), 306 mg (07/18/2019) atezolizumab (TECENTRIQ) 1,200 mg in sodium chloride 0.9 % 250 mL chemo infusion, 1,200 mg, Intravenous, Once, 8 of 10 cycles Administration: 1,200 mg (05/09/2019), 1,200 mg (05/30/2019), 1,200 mg (06/20/2019), 1,200 mg (08/15/2019), 1,200 mg (09/05/2019), 1,200 mg (04/18/2019), 1,200 mg (07/18/2019), 1,200 mg (09/26/2019) bevacizumab-bvzr (ZIRABEV) 1,000 mg in sodium chloride 0.9 % 100 mL chemo infusion, 15 mg/kg = 1,000 mg, Intravenous,  Once, 8 of 10 cycles Administration: 1,000 mg  (05/09/2019), 1,000 mg (05/30/2019), 1,000 mg (06/20/2019), 1,000 mg (08/15/2019), 1,000 mg (09/05/2019), 1,000 mg (04/18/2019), 1,000 mg (07/18/2019), 900 mg (09/26/2019)  for chemotherapy treatment.    Cancer, metastatic to bone ([place])  12/16/2018 Initial Diagnosis   Cancer, metastatic to bone ([place])   04/16/2019 -  Chemotherapy   The patient had dexamethasone (DECADRON) 4 MG tablet, 8 mg, Oral, Daily, 1 of 1 cycle, Start date: 09/05/2019, End date: 10/26/2019 palonosetron (ALOXI) injection 0.25 mg, 0.25 mg, Intravenous,  Once, 6 of 6 cycles Administration: 0.25 mg (04/16/2019), 0.25 mg (05/09/2019), 0.25 mg (05/30/2019), 0.25 mg (06/20/2019), 0.25 mg (08/15/2019), 0.25 mg (07/18/2019) CARBOplatin (PARAPLATIN) 470 mg in sodium chloride 0.9 % 250 mL chemo infusion, 470 mg (100 % of original dose 465.5 mg), Intravenous,  Once, 6 of 6 cycles Dose modification:   (original dose 465.5 mg, Cycle 1) Administration: 470 mg (04/16/2019), 470 mg (05/09/2019), 470 mg (05/30/2019), 470 mg (06/20/2019), 420 mg (08/15/2019), 420 mg (07/18/2019) fosaprepitant (EMEND) 150 mg in sodium chloride 0.9 % 145 mL IVPB, 150 mg, Intravenous,  Once, 6 of 6 cycles Administration: 150 mg (04/16/2019), 150 mg (05/09/2019), 150 mg (05/30/2019), 150 mg (06/20/2019), 150 mg (08/15/2019), 150 mg (07/18/2019) PACLitaxel (TAXOL) 354 mg  in sodium chloride 0.9 % 500 mL chemo infusion (> $RemoveBef'80mg'iWwXAsKNbS$ /m2), 200 mg/m2 = 354 mg, Intravenous,  Once, 6 of 6 cycles Dose modification: 175 mg/m2 (original dose 200 mg/m2, Cycle 6, Reason: Provider Judgment), 135 mg/m2 (original dose 200 mg/m2, Cycle 6, Reason: Other (see comments), Comment: neuropathy) Administration: 354 mg (04/16/2019), 354 mg (05/09/2019), 354 mg (05/30/2019), 354 mg (06/20/2019), 240 mg (08/15/2019), 306 mg (07/18/2019) atezolizumab (TECENTRIQ) 1,200 mg in sodium chloride 0.9 % 250 mL chemo infusion, 1,200 mg, Intravenous, Once, 8 of 10 cycles Administration: 1,200 mg (05/09/2019), 1,200 mg (05/30/2019), 1,200 mg (06/20/2019), 1,200  mg (08/15/2019), 1,200 mg (09/05/2019), 1,200 mg (04/18/2019), 1,200 mg (07/18/2019), 1,200 mg (09/26/2019) bevacizumab-bvzr (ZIRABEV) 1,000 mg in sodium chloride 0.9 % 100 mL chemo infusion, 15 mg/kg = 1,000 mg, Intravenous,  Once, 8 of 10 cycles Administration: 1,000 mg (05/09/2019), 1,000 mg (05/30/2019), 1,000 mg (06/20/2019), 1,000 mg (08/15/2019), 1,000 mg (09/05/2019), 1,000 mg (04/18/2019), 1,000 mg (07/18/2019), 900 mg (09/26/2019)  for chemotherapy treatment.      ALLERGIES:  is allergic to 5-alpha reductase inhibitors.  MEDICATIONS:  Current Outpatient Medications  Medication Sig Dispense Refill  . aspirin EC 81 MG tablet Take 1 tablet (81 mg total) by mouth daily. Swallow whole. (Patient not taking: Reported on 10/30/2019) 90 tablet 1  . dexamethasone (DECADRON) 4 MG tablet Take 1 tablet (4 mg total) by mouth daily. 7 tablet 0  . DULoxetine (CYMBALTA) 30 MG capsule Take 1 capsule (30 mg total) by mouth daily. 30 capsule 0  . fentaNYL (DURAGESIC) 25 MCG/HR Place 1 patch onto the skin every 3 (three) days. 10 patch 0  . gabapentin (NEURONTIN) 300 MG capsule Take 1 capsule (300 mg total) by mouth 3 (three) times daily. (Patient not taking: Reported on 12/07/2019) 120 capsule 0  . hydrochlorothiazide (HYDRODIURIL) 25 MG tablet Take 1 tablet (25 mg total) by mouth daily. (Patient not taking: Reported on 11/09/2019) 30 tablet 12  . lidocaine-prilocaine (EMLA) cream Apply to affected area once 30 g 3  . loperamide (IMODIUM) 2 MG capsule Take 1 capsule (2 mg total) by mouth See admin instructions. With onset of loose stool, take $RemoveBef'4mg'KWrzszdaOH$  followed by $RemoveBef'2mg'dtUgkSUJvG$  every 2 hours until 12 hours have passed without loose bowel movement. Maximum: 16 mg/day (Patient not taking: Reported on 10/26/2019) 60 capsule 1  . magnesium chloride (SLOW-MAG) 64 MG TBEC SR tablet Take 1 tablet (64 mg total) by mouth daily. 30 tablet 1  . megestrol (MEGACE) 40 MG/ML suspension Take 10 mLs (400 mg total) by mouth daily. (Patient not taking:  Reported on 12/07/2019) 240 mL 0  . Multiple Vitamin (MULTIVITAMIN) capsule Take 1 capsule by mouth daily.    Marland Kitchen NARCAN 4 MG/0.1ML LIQD nasal spray kit 1 spray once.  (Patient not taking: Reported on 09/25/2019)    . ondansetron (ZOFRAN) 8 MG tablet Take 1 tablet (8 mg total) by mouth 2 (two) times daily as needed for refractory nausea / vomiting. Start on day 3 after carboplatin chemo. (Patient not taking: Reported on 10/26/2019) 30 tablet 1  . oxyCODONE (ROXICODONE) 5 MG immediate release tablet Take 1 tablet (5 mg total) by mouth every 8 (eight) hours as needed for severe pain or breakthrough pain. (Patient not taking: Reported on 12/07/2019) 60 tablet 0  . polyethylene glycol (MIRALAX / GLYCOLAX) 17 g packet Take 17 g by mouth daily. (Patient not taking: Reported on 10/26/2019) 14 each 0  . potassium chloride (KLOR-CON) 10 MEQ tablet Take 2 tablets (20 mEq total) by mouth  daily. 30 tablet 1  . prochlorperazine (COMPAZINE) 10 MG tablet Take 1 tablet (10 mg total) by mouth every 6 (six) hours as needed (Nausea or vomiting). (Patient not taking: Reported on 10/26/2019) 30 tablet 1  . senna (SENOKOT) 8.6 MG TABS tablet Take 1 tablet (8.6 mg total) by mouth daily. (Patient not taking: Reported on 10/26/2019) 120 tablet 0   No current facility-administered medications for this visit.   Facility-Administered Medications Ordered in Other Visits  Medication Dose Route Frequency Provider Last Rate Last Admin  . 0.9 % NaCl with KCl 20 mEq/ L  infusion   Intravenous Once Earlie Server, MD      . dexamethasone (DECADRON) 10 mg in sodium chloride 0.9 % 50 mL IVPB  10 mg Intravenous Once Earlie Server, MD      . heparin lock flush 100 unit/mL  500 Units Intracatheter Once PRN Earlie Server, MD      . magnesium sulfate IVPB 2 g 50 mL  2 g Intravenous Once Earlie Server, MD      . sodium chloride flush (NS) 0.9 % injection 10 mL  10 mL Intracatheter Once PRN Earlie Server, MD        VITAL SIGNS: There were no vitals taken for this  visit. There were no vitals filed for this visit.  Estimated body mass index is 19.77 kg/m as calculated from the following:   Height as of 09/18/19: $RemoveBe'5\' 5"'FThIiYWNj$  (1.651 m).   Weight as of an earlier encounter on 12/07/19: 118 lb 12.8 oz (53.9 kg).  LABS: CBC:    Component Value Date/Time   WBC 13.4 (H) 12/07/2019 1251   HGB 10.5 (L) 12/07/2019 1251   HGB 14.6 06/23/2018 1100   HCT 30.5 (L) 12/07/2019 1251   HCT 43.2 06/23/2018 1100   PLT 396 12/07/2019 1251   PLT 297 06/23/2018 1100   MCV 80.5 12/07/2019 1251   MCV 96 06/23/2018 1100   MCV 88 09/14/2013 0511   NEUTROABS 11.1 (H) 12/07/2019 1251   NEUTROABS 2.8 06/23/2018 1100   NEUTROABS 12.4 (H) 09/14/2013 0511   LYMPHSABS 0.9 12/07/2019 1251   LYMPHSABS 1.8 06/23/2018 1100   LYMPHSABS 2.3 09/14/2013 0511   MONOABS 1.2 (H) 12/07/2019 1251   MONOABS 1.1 (H) 09/14/2013 0511   EOSABS 0.0 12/07/2019 1251   EOSABS 0.1 06/23/2018 1100   EOSABS 0.2 09/14/2013 0511   BASOSABS 0.0 12/07/2019 1251   BASOSABS 0.0 06/23/2018 1100   BASOSABS 0.0 09/14/2013 0511   Comprehensive Metabolic Panel:    Component Value Date/Time   NA 132 (L) 12/07/2019 1251   NA 136 06/23/2018 1100   NA 139 09/13/2013 0408   K 2.9 (L) 12/07/2019 1251   K 3.3 (L) 09/13/2013 0408   CL 94 (L) 12/07/2019 1251   CL 105 09/13/2013 0408   CO2 28 12/07/2019 1251   CO2 27 09/13/2013 0408   BUN 8 12/07/2019 1251   BUN 6 (L) 06/23/2018 1100   BUN 4 (L) 09/13/2013 0408   CREATININE 0.56 (L) 12/07/2019 1251   CREATININE 0.67 09/13/2013 0408   GLUCOSE 123 (H) 12/07/2019 1251   GLUCOSE 108 (H) 09/13/2013 0408   CALCIUM 7.9 (L) 12/07/2019 1251   CALCIUM 8.1 (L) 09/13/2013 0408   AST 24 12/07/2019 1251   AST 30 09/13/2013 0408   ALT 13 12/07/2019 1251   ALT 27 09/13/2013 0408   ALKPHOS 68 12/07/2019 1251   ALKPHOS 218 (H) 09/13/2013 0408   BILITOT 1.0 12/07/2019 1251  BILITOT 0.5 06/23/2018 1100   BILITOT 0.9 09/13/2013 0408   PROT 6.8 12/07/2019 1251   PROT  7.4 06/23/2018 1100   PROT 6.2 (L) 09/13/2013 0408   ALBUMIN 2.4 (L) 12/07/2019 1251   ALBUMIN 4.0 06/23/2018 1100   ALBUMIN 1.9 (L) 09/13/2013 0408    RADIOGRAPHIC STUDIES: No results found.  PERFORMANCE STATUS (ECOG) : 1 - Symptomatic but completely ambulatory  Review of Systems Unless otherwise noted, a complete review of systems is negative.  Physical Exam General: NAD Pulmonary: unlabored Extremities: no edema, no joint deformities Skin: no rashes Neurological: Weakness but otherwise nonfocal  IMPRESSION: Patient was an add-on to my clinic schedule today due to progressive weakness, weight loss, and poor oral intake.  Overall, patient has been declining.  His oral intake is minimal.  He has lost 12 pounds over the past couple weeks.  He is progressively weak.  Patient is no longer felt to be a viable candidate for systemic treatment after his visit today with Dr. Tasia Catchings.  Hospice is being recommended.  I spoke with patient about hospice and he was in agreement.  I called in order for hospice to [facility].   I am concerned that patient lives at home alone.  I asked that hospice prioritize his admission for this weekend.  It may be that patient would benefit from hospice IPU in the setting of sustained decline.  PLAN: -Best supportive care -Hospice at home -Continue oxycodone 5 mg every 8 hours as needed -Recommend starting oral dexamethasone -DNR/DNI -RTC as needed  Case and plan discussed with Dr. Tasia Catchings  Patient expressed understanding and was in agreement with this plan. He also understands that He can call the clinic at any time with any questions, concerns, or complaints.     Time Total: 20 minutes  Visit consisted of counseling and education dealing with the complex and emotionally intense issues of symptom management and palliative care in the setting of serious and potentially life-threatening illness.Greater than 50%  of this time was spent counseling and  coordinating care related to the above assessment and plan.  Signed by: Altha Harm, PhD, NP-C

## 2019-12-07 NOTE — Progress Notes (Signed)
Hematology/Oncology follow up note [facility] [phone] [phone]  Reassurance Patient Care Team: Gilbert, Richard L Jr., MD as PCP - General (Family Medicine) Agbor-Etang, Brian, MD as PCP - Cardiology (Cardiology) Rhode, Hayley, RN as Registered Nurse Stanton, Kristi D, RN as Registered Nurse  REFERRING PROVIDER: Gilbert, Richard L Jr.,*  CHIEF COMPLAINTS/REASON FOR VISIT:  metastatic cancer management.  HISTORY OF PRESENTING ILLNESS:   Samuel D Bracknell Jr. is a  69 y.o.  male with PMH listed below was seen in consultation at the request of  Gilbert, Richard L Jr.,*  for evaluation of lung nodule in the liver lesion. Patient has a past medical history of hypertension, alcohol abuse, smoking emphysema.  He has had serial CTs done in the past.  CT images were independently reviewed by me. 07/26/2017 CT chest with contrast showed extensive pleural-parenchymal scarring within the left upper lobe with several areas of soft tissue nodularity measuring up to 1.6 cm.  In this patient who is at increased risk of lung cancer further investigation with PET scan is recommended. Small chronic appearance loculated hydropneumothorax overlies the left apex. Slowly enlarging lesion within the central portion of the right lobe of liver identified. Per note, patient supposed to have additional work-up done in December repeat CT scan which he did not  Patient was recently seen by primary care provider and has subsequent image done for follow-up. CT on 06/30/2018 showed multiple significantly enlarged paraspinal mass are noted concerning.  The largest measures 3.8 x 0.5 cm and there appears to be a lytic destruction of the adjacent T11 vertebral body.  Also noted is new solid density measuring 17 x 12 mm in the pleural parenchymal density in the left upper lobe noted on prior exam.  Concerning for malignancy.  4.4 ascending thoracic aortic aneurysm.  Recommend annual  imaging.  Patient had PET scan done on 07/06/2018 Which showed there are 2 FDG avid lesion within the left upper lobe worrisome for primary lung neoplasm.  There is evidence of chest wall involvement.  Metastasis to the posterior mediastinum is identified with extension into T11 vertebra and possible involvement of the left T12 neural foramina.  Patient reports left shoulder pain.  Smoking half a pack a day not motivated with further questioning quitting Denies weight loss, hemoptysis, cough.  Chronic shortness of breath with exertion. He drinks alcohol daily. Lives with his girlfriend.  He has 5 adult kids  #Hepatitis C  # 6/1?2020  CT-guided biopsy of left-sided posterior mediastinal soft tissue adjacent to T10/11. Patient's case was discussed on tumor board. Consensus was reached that HCC versus primary lung cancer with hepatoid adenocarcinoma features. Consensus was to proceed with liver biopsy first as liver mass was not FDG avid on PET scan.  Patient underwent liver biopsy and the present to discuss pathology reports.  ## 08/08/2018 Liver mass biopsy showed fragments of necrotic and calcified tissue with adjacent fibrous capsule Scant viable nonneoplastic liver tissue with steatosis, inflammation and non specific fibrosis.  # 08/28/2018 CT guided left upper lobe lung mass biopsy showed poorly differentiated adenocarcinoma of lung origin.  6/19-08/16/2018  Finished palliative radiation to paraspinal soft tissue mass. 09/07/2018 patient was started on lenvatinib 12 mg daily. 10/13/2018 SBRT to lung cancer lesion.  #Patient is on Zometa monthly #He was advised to take calcium supplement.  He reports he quit taking calcium as he broke out rash on her scalp, neck and chest after taking calcium. # 03/30/19 liver lesion biopsy showed poorly   differentiated adenocarcinoma, compatible with lung primary.  INTERVAL HISTORY Samuel Hood. is a 69 y.o. male who has above history reviewed by me today  presents for evaluation prior to chemotherapy for lung cancer and HCC.  #Patient reports neuropathy symptoms are stable. He has now been feeling well.  Decreased appetite, increased weakness. He takes Slow-Mag 1 tablet daily as well as potassium chloride 10 mEq daily.  No nausea vomiting no diarrhea. Generalized body aches related to certain positions.  Currently he feels some body pain due to sitting in a wheelchair.  He has lost 12 pounds since last visit 1 month ago.   .   Review of Systems  Constitutional: Positive for appetite change, fatigue and unexpected weight change. Negative for chills and fever.  HENT:   Negative for hearing loss and voice change.   Eyes: Negative for eye problems and icterus.  Respiratory: Negative for chest tightness, cough and shortness of breath.   Cardiovascular: Negative for chest pain and leg swelling.  Gastrointestinal: Negative for abdominal distention, abdominal pain and constipation.  Endocrine: Negative for hot flashes.  Genitourinary: Negative for difficulty urinating, dysuria and frequency.   Musculoskeletal: Negative for arthralgias.  Skin: Negative for itching and rash.  Neurological: Positive for numbness. Negative for light-headedness.  Hematological: Negative for adenopathy. Does not bruise/bleed easily.  Psychiatric/Behavioral: Negative for confusion.    MEDICAL HISTORY:  Past Medical History:  Diagnosis Date  . Hepatocellular carcinoma metastatic to left lung ([name]) 07/24/2018  . History of angiography    left lower extremity  . Hypertension   . Small bowel obstruction ([place])     SURGICAL HISTORY: Past Surgical History:  Procedure Laterality Date  . COLON SURGERY    . COLONOSCOPY W/ POLYPECTOMY    . COLONOSCOPY WITH PROPOFOL N/A 01/31/2018   Procedure: COLONOSCOPY WITH PROPOFOL;  Surgeon: Toledo, Benay Pike, MD;  Location: [facility] ENDOSCOPY;  Service: Gastroenterology;  Laterality: N/A;  . debridement fasciotomy leg, left Left  03/19/2016  . LAPAROTOMY N/A 09/27/2014   Procedure: EXPLORATORY LAPAROTOMY;  Surgeon: Dia Crawford III, MD;  Location: [facility] ORS;  Service: General;  Laterality: N/A;  . PORTA CATH INSERTION N/A 04/10/2019   Procedure: PORTA CATH INSERTION;  Surgeon: Katha Cabal, MD;  Location: [facility];  Service: Cardiovascular;  Laterality: N/A;    SOCIAL HISTORY: Social History   Socioeconomic History  . Marital status: Single    Spouse name: Not on file  . Number of children: Not on file  . Years of education: Not on file  . Highest education level: Not on file  Occupational History  . Occupation: retired  Tobacco Use  . Smoking status: Current Every Day Smoker    Packs/day: 0.25    Years: 50.00    Pack years: 12.50  . Smokeless tobacco: Never Used  . Tobacco comment: 3 cigarettes per day  Vaping Use  . Vaping Use: Never used  Substance and Sexual Activity  . Alcohol use: Not Currently    Alcohol/week: 35.0 standard drinks    Types: 30 Cans of beer, 5 Shots of liquor per week  . Drug use: No  . Sexual activity: Not on file  Other Topics Concern  . Not on file  Social History Narrative  . Not on file   Social Determinants of Health   Financial Resource Strain:   . Difficulty of Paying Living Expenses: Not on file  Food Insecurity:   . Worried About Charity fundraiser in the  Last Year: Not on file  . Ran Out of Food in the Last Year: Not on file  Transportation Needs:   . Lack of Transportation (Medical): Not on file  . Lack of Transportation (Non-Medical): Not on file  Physical Activity:   . Days of Exercise per Week: Not on file  . Minutes of Exercise per Session: Not on file  Stress:   . Feeling of Stress : Not on file  Social Connections:   . Frequency of Communication with Friends and Family: Not on file  . Frequency of Social Gatherings with Friends and Family: Not on file  . Attends Religious Services: Not on file  . Active Member of Clubs or  Organizations: Not on file  . Attends Archivist Meetings: Not on file  . Marital Status: Not on file  Intimate Partner Violence:   . Fear of Current or Ex-Partner: Not on file  . Emotionally Abused: Not on file  . Physically Abused: Not on file  . Sexually Abused: Not on file    FAMILY HISTORY: Family History  Problem Relation Age of Onset  . Cancer Mother   . Cancer Father     ALLERGIES:  is allergic to 5-alpha reductase inhibitors.  MEDICATIONS:  Current Outpatient Medications  Medication Sig Dispense Refill  . DULoxetine (CYMBALTA) 30 MG capsule Take 1 capsule (30 mg total) by mouth daily. 30 capsule 0  . fentaNYL (DURAGESIC) 25 MCG/HR Place 1 patch onto the skin every 3 (three) days. 10 patch 0  . lidocaine-prilocaine (EMLA) cream Apply to affected area once 30 g 3  . magnesium chloride (SLOW-MAG) 64 MG TBEC SR tablet Take 1 tablet (64 mg total) by mouth daily. 30 tablet 1  . Multiple Vitamin (MULTIVITAMIN) capsule Take 1 capsule by mouth daily.    . potassium chloride (KLOR-CON) 10 MEQ tablet Take 2 tablets (20 mEq total) by mouth daily. 30 tablet 1  . aspirin EC 81 MG tablet Take 1 tablet (81 mg total) by mouth daily. Swallow whole. (Patient not taking: Reported on 10/30/2019) 90 tablet 1  . dexamethasone (DECADRON) 4 MG tablet Take 1 tablet (4 mg total) by mouth daily. 7 tablet 0  . gabapentin (NEURONTIN) 300 MG capsule Take 1 capsule (300 mg total) by mouth 3 (three) times daily. (Patient not taking: Reported on 12/07/2019) 120 capsule 0  . hydrochlorothiazide (HYDRODIURIL) 25 MG tablet Take 1 tablet (25 mg total) by mouth daily. (Patient not taking: Reported on 11/09/2019) 30 tablet 12  . loperamide (IMODIUM) 2 MG capsule Take 1 capsule (2 mg total) by mouth See admin instructions. With onset of loose stool, take $RemoveBef'4mg'tMHMTxhPiH$  followed by $RemoveBef'2mg'YCgGORjzuq$  every 2 hours until 12 hours have passed without loose bowel movement. Maximum: 16 mg/day (Patient not taking: Reported on 10/26/2019) 60  capsule 1  . megestrol (MEGACE) 40 MG/ML suspension Take 10 mLs (400 mg total) by mouth daily. (Patient not taking: Reported on 12/07/2019) 240 mL 0  . NARCAN 4 MG/0.1ML LIQD nasal spray kit 1 spray once.  (Patient not taking: Reported on 09/25/2019)    . ondansetron (ZOFRAN) 8 MG tablet Take 1 tablet (8 mg total) by mouth 2 (two) times daily as needed for refractory nausea / vomiting. Start on day 3 after carboplatin chemo. (Patient not taking: Reported on 10/26/2019) 30 tablet 1  . oxyCODONE (ROXICODONE) 5 MG immediate release tablet Take 1 tablet (5 mg total) by mouth every 8 (eight) hours as needed for severe pain or breakthrough pain. (Patient  not taking: Reported on 12/07/2019) 60 tablet 0  . polyethylene glycol (MIRALAX / GLYCOLAX) 17 g packet Take 17 g by mouth daily. (Patient not taking: Reported on 10/26/2019) 14 each 0  . prochlorperazine (COMPAZINE) 10 MG tablet Take 1 tablet (10 mg total) by mouth every 6 (six) hours as needed (Nausea or vomiting). (Patient not taking: Reported on 10/26/2019) 30 tablet 1  . senna (SENOKOT) 8.6 MG TABS tablet Take 1 tablet (8.6 mg total) by mouth daily. (Patient not taking: Reported on 10/26/2019) 120 tablet 0   No current facility-administered medications for this visit.     PHYSICAL EXAMINATION: ECOG PERFORMANCE STATUS: 1 - Symptomatic but completely ambulatory Vitals:   12/07/19 1315  BP: 98/63  Pulse: (!) 123  Temp: (!) 97.4 F (36.3 C)  SpO2: 99%   Filed Weights   12/07/19 1315  Weight: 118 lb 12.8 oz (53.9 kg)    Physical Exam Constitutional:      General: He is not in acute distress.    Appearance: He is ill-appearing.  HENT:     Head: Normocephalic and atraumatic.  Eyes:     General: No scleral icterus. Cardiovascular:     Rate and Rhythm: Normal rate and regular rhythm.     Heart sounds: Normal heart sounds.  Pulmonary:     Effort: Pulmonary effort is normal. No respiratory distress.     Breath sounds: No wheezing or rales.      Comments: Severely decreased bilateral breath sound. Abdominal:     General: Bowel sounds are normal. There is no distension.     Palpations: Abdomen is soft.  Musculoskeletal:        General: No deformity. Normal range of motion.     Cervical back: Normal range of motion and neck supple.  Skin:    General: Skin is warm and dry.     Findings: No erythema or rash.  Neurological:     Mental Status: He is alert and oriented to person, place, and time. Mental status is at baseline.     Cranial Nerves: No cranial nerve deficit.     Coordination: Coordination normal.  Psychiatric:        Mood and Affect: Mood normal.     LABORATORY DATA:  I have reviewed the data as listed Lab Results  Component Value Date   WBC 13.4 (H) 12/07/2019   HGB 10.5 (L) 12/07/2019   HCT 30.5 (L) 12/07/2019   MCV 80.5 12/07/2019   PLT 396 12/07/2019   Recent Labs    10/19/19 0935 10/26/19 1238 11/02/19 0957 11/09/19 0820 12/07/19 1251  NA   < > 133* 133* 138 132*  K   < > 3.2* 2.8* 3.3* 2.9*  CL   < > 95* 95* 101 94*  CO2   < > $R'27 26 26 28  'zz$ GLUCOSE   < > 99 143* 116* 123*  BUN   < > 10 7* 7* 8  CREATININE   < > 0.61 0.47* 0.49* 0.56*  CALCIUM   < > 8.7* 8.1* 8.3* 7.9*  GFRNONAA   < > >60 >60 >60 >60  GFRAA  --  >60 >60 >60  --   PROT   < >  --  6.6 6.8 6.8  ALBUMIN   < >  --  2.6* 2.5* 2.4*  AST   < >  --  29 38 24  ALT   < >  --  $R'18 20 13  'Pp$ ALKPHOS   < >  --  55 60 68  BILITOT   < >  --  0.6 0.6 1.0   < > = values in this interval not displayed.   Iron/TIBC/Ferritin/ %Sat No results found for: IRON, TIBC, FERRITIN, IRONPCTSAT   RADIOGRAPHIC STUDIES: I have personally reviewed the radiological images as listed and agreed with the findings in the report. CT CHEST ABDOMEN PELVIS W CONTRAST  Result Date: 10/24/2019 CLINICAL DATA:  Left lung cancer restaging. Worsening shortness of breath on exertion. EXAM: CT CHEST, ABDOMEN, AND PELVIS WITH CONTRAST TECHNIQUE: Multidetector CT imaging of  the chest, abdomen and pelvis was performed following the standard protocol during bolus administration of intravenous contrast. CONTRAST:  63mL OMNIPAQUE IOHEXOL 300 MG/ML  SOLN COMPARISON:  Multiple exams, including CT examinations from 07/04/2019 and 07/24/2019 FINDINGS: CT CHEST FINDINGS Cardiovascular: Ascending thoracic aortic aneurysm 4.1 cm in diameter on image 32/2, unchanged. Considerable collateralization from the left upper extremity venous injection possibly due to stenosis in the subclavian vein or of lower SVC, resulting in an unusual contrast bolus with considerable pulmonary arterial contrast greater than the systemic arterial contrast. Atherosclerotic calcification of the aortic arch and coronary arteries. Mediastinum/Nodes: Unremarkable Lungs/Pleura: Severe emphysema. Increased bandlike density and volume loss in the left upper lobe. Increased wall thickening associated with a superior segment left lower lobe bulla, previously with wall thickness of only about 0.2 cm, currently up to 0.8 cm on image 43/3. Infected bulla not excluded although this could be from radiation therapy. Airway thickening is present, suggesting bronchitis or reactive airways disease. Musculoskeletal: Thoracic spondylosis. No recurrent paraspinal mass. Stable lucency with thin rim sclerosis in the T11 vertebra, indicating site of prior bony erosion from the paraspinal mass. CT ABDOMEN PELVIS FINDINGS Hepatobiliary: The phase of contrast is early based on the contrast timing issues noted in the chest section. We did cover the entire liver on the delayed images as well. Dependent density in the gallbladder favoring gallstones. Stable 2.9 by 2.9 cm hypodense lesion in the right hepatic lobe with faint marginal and internal septations and calcification, probably a previously treated metastatic lesion. A subtle hypodense lesion further inferiorly in the right hepatic lobe measuring 0.8 cm in diameter on image 17/9 previously  measured about 1.9 cm by my measurements, and is accordingly reduced in size. No new liver lesion.  No biliary dilatation is identified. Pancreas: Unremarkable Spleen: Unremarkable Adrenals/Urinary Tract: Stable band of hypoenhancement medially in the right kidney upper pole as on image 11/9. If the patient has had prior radiation therapy in the region, that may be the cause for this chronic appearance. Right mid kidney hypodense lesions are again observed, the larger lesion is thought to be complex but sharply defined and otherwise nonspecific. Left kidney appears normal. Adrenal glands unremarkable. Stomach/Bowel: Postoperative findings in the sigmoid colon. Vascular/Lymphatic: Aortoiliac atherosclerotic vascular disease. Porta hepatis/peripancreatic node measures is 0.9 cm in short axis on image 63/2, stable. No pathologic adenopathy is identified. Reproductive: Speckled calcifications along the penile tunica albuginea. Other: No supplemental non-categorized findings. Musculoskeletal: Spurring of both sacroiliac joints. Grade 1 degenerative anterolisthesis at L5-S1. Lumbar spondylosis and degenerative disc disease causing multilevel impingement. IMPRESSION: 1. Reduced size and conspicuity of the lower of the 2 liver lesions, suggesting improvement of this lesion. The other lesion is stable. 2. There is increase in volume loss and bandlike density in the left upper lobe, along with increased wall thickening of an adjacent superior segment left lower lobe bulla. These findings could be inflammatory or from radiation therapy, strictly speaking,  underlying progressive malignancy is difficult to exclude. 3. Stable lucency with thin rim sclerosis in the T11 vertebra, indicating site of prior bony invasion from the paraspinal mass. No recurrent paraspinal mass is present. 4. Other imaging findings of potential clinical significance: Coronary atherosclerosis. Airway thickening is present, suggesting bronchitis or  reactive airways disease. Stable 4.1 cm ascending thoracic aortic aneurysm. Cholelithiasis. Stable band of hypoenhancement medially in the right kidney upper pole, probably from prior radiation therapy. Lumbar spondylosis and degenerative disc disease causing multilevel impingement. 5. Emphysema and aortic atherosclerosis. Aortic Atherosclerosis (ICD10-I70.0) and Emphysema (ICD10-J43.9). Electronically Signed   By: Van Clines M.D.   On: 10/24/2019 14:36   ECHOCARDIOGRAM COMPLETE  Result Date: 09/14/2019    ECHOCARDIOGRAM REPORT   Patient Name:   Samuel Hood. Date of Exam: 09/14/2019 Medical Rec #:  [id number]        Height:       65.0 in Accession #:    [id number]       Weight:       133.2 lb Date of Birth:  [birth year]         BSA:          1.664 m Patient Age:    22 years         BP:           132/90 mmHg Patient Gender: M                HR:           103 bpm. Exam Location:  [facility] Procedure: 2D Echo, Cardiac Doppler and Color Doppler Indications:    R06.02 SOB  History:        Patient has no prior history of Echocardiogram examinations.                 Thoracic aortic aneurysm, COPD, Arrythmias:Tachycardia,                 Signs/Symptoms:Shortness of Breath; Risk Factors:Former Smoker                 and Hypertension.  Sonographer:    Pilar Jarvis RDMS, RVT, RDCS Referring Phys: [id number] BRIAN AGBOR-ETANG IMPRESSIONS  1. Left ventricular ejection fraction, by estimation, is 55%. The left ventricle has normal function. The left ventricle has no regional wall motion abnormalities. There is mild left ventricular hypertrophy. Left ventricular diastolic parameters are consistent with Grade I diastolic dysfunction (impaired relaxation).  2. Right ventricular systolic function is normal. The right ventricular size is normal. There is normal pulmonary artery systolic pressure. The estimated right ventricular systolic pressure is 06.2 mmHg.  3. Tricuspid valve regurgitation is mild to moderate.  4. There is  dilatation of the aortic root measuring 36 mm. Ascending aorta 4.0 cm FINDINGS  Left Ventricle: Left ventricular ejection fraction, by estimation, is 55 to 60%. The left ventricle has normal function. The left ventricle has no regional wall motion abnormalities. The left ventricular internal cavity size was normal in size. There is  mild left ventricular hypertrophy. Left ventricular diastolic parameters are consistent with Grade I diastolic dysfunction (impaired relaxation). Right Ventricle: The right ventricular size is normal. No increase in right ventricular wall thickness. Right ventricular systolic function is normal. There is normal pulmonary artery systolic pressure. The tricuspid regurgitant velocity is 2.32 m/s, and  with an assumed right atrial pressure of 10 mmHg, the estimated right ventricular systolic pressure is 37.6 mmHg. Left Atrium: Left atrial size was normal in  size. Right Atrium: Right atrial size was normal in size. Pericardium: There is no evidence of pericardial effusion. Mitral Valve: The mitral valve is normal in structure. Normal mobility of the mitral valve leaflets. No evidence of mitral valve regurgitation. No evidence of mitral valve stenosis. Tricuspid Valve: The tricuspid valve is normal in structure. Tricuspid valve regurgitation is mild to moderate. No evidence of tricuspid stenosis. Aortic Valve: The aortic valve is normal in structure. Aortic valve regurgitation is not visualized. Mild to moderate aortic valve sclerosis/calcification is present, without any evidence of aortic stenosis. Aortic valve mean gradient measures 2.0 mmHg. Aortic valve peak gradient measures 3.8 mmHg. Aortic valve area, by VTI measures 4.14 cm. Pulmonic Valve: The pulmonic valve was normal in structure. Pulmonic valve regurgitation is not visualized. No evidence of pulmonic stenosis. Aorta: The aortic root is normal in size and structure. There is dilatation of the aortic root measuring 36 mm. Venous:  The inferior vena cava is normal in size with greater than 50% respiratory variability, suggesting right atrial pressure of 3 mmHg. IAS/Shunts: No atrial level shunt detected by color flow Doppler.  LEFT VENTRICLE PLAX 2D LVIDd:         2.90 cm     Diastology LVIDs:         2.00 cm     LV e' lateral:   11.10 cm/s LV PW:         0.80 cm     LV E/e' lateral: 4.4 LV IVS:        0.80 cm     LV e' medial:    6.74 cm/s LVOT diam:     2.00 cm     LV E/e' medial:  7.3 LV SV:         47 LV SV Index:   28 LVOT Area:     3.14 cm  LV Volumes (MOD) LV vol d, MOD A2C: 37.6 ml LV vol d, MOD A4C: 34.6 ml LV vol s, MOD A2C: 20.7 ml LV vol s, MOD A4C: 19.0 ml LV SV MOD A2C:     16.9 ml LV SV MOD A4C:     34.6 ml LV SV MOD BP:      17.9 ml RIGHT VENTRICLE            IVC RV Basal diam:  2.50 cm    IVC diam: 0.60 cm RV S prime:     7.51 cm/s TAPSE (M-mode): 1.2 cm LEFT ATRIUM             Index       RIGHT ATRIUM          Index LA diam:        2.60 cm 1.56 cm/m  RA Area:     7.80 cm LA Vol (A2C):   19.4 ml 11.66 ml/m RA Volume:   14.70 ml 8.83 ml/m LA Vol (A4C):   11.9 ml 7.15 ml/m LA Biplane Vol: 16.7 ml 10.03 ml/m  AORTIC VALVE                   PULMONIC VALVE AV Area (Vmax):    2.87 cm    PV Vmax:       0.54 m/s AV Area (Vmean):   3.04 cm    PV Peak grad:  1.2 mmHg AV Area (VTI):     4.14 cm AV Vmax:           97.50 cm/s AV Vmean:  60.900 cm/s AV VTI:            0.113 m AV Peak Grad:      3.8 mmHg AV Mean Grad:      2.0 mmHg LVOT Vmax:         89.20 cm/s LVOT Vmean:        58.900 cm/s LVOT VTI:          0.149 m LVOT/AV VTI ratio: 1.32  AORTA Ao Root diam: 3.60 cm Ao Asc diam:  4.00 cm Ao Arch diam: 2.9 cm MITRAL VALVE               TRICUSPID VALVE MV Area (PHT): 9.73 cm    TR Peak grad:   21.5 mmHg MV Decel Time: 78 msec     TR Vmax:        232.00 cm/s MV E velocity: 49.30 cm/s MV A velocity: 81.40 cm/s  SHUNTS MV E/A ratio:  0.61        Systemic VTI:  0.15 m                            Systemic Diam: 2.00 cm Ida Rogue MD Electronically signed by Ida Rogue MD Signature Date/Time: 09/14/2019/8:21:14 PM    Final       ASSESSMENT & PLAN:  1. Stage IV adenocarcinoma of lung, left ([place])   2. Hepatocellular carcinoma metastatic to left lung ([place])   3. Goals of care, counseling/discussion   4. Hypokalemia   5. Hypomagnesemia   6. Tachycardia   7. Hypotension, unspecified hypotension type   8. Weight loss    #Stage IV Lung adenocarcinoma, Stage IV HCC-07/17/2018 paraspinal soft tissue biopsy Patient was on palliative chemotherapy with carboplatin, Taxol, bevacizumab, Tecentriq. Continue hold chemotherapy due to poor performance status. Most recent CT scan was in September 2021 which showed stable disease. Patient expresses his desire not to proceed any additional treatment for cancer.  He would like to focus on life quality.  I think this is reasonable choice given that his disease not curable and overall prognosis is poor We will refer to palliative care service for further discussion and consider to be enrolled in hospice I offered patient to call her daughter for further discussion and he declined.  #Poor oral intake, dehydration, patient will receive IV fluid normal saline x1 today. #Hypokalemia, I recommend him to increase oral potassium chloride to 20 mEq daily. IV potassium chloride 20 mEq x 1 today. #Hypomagnesia, chronic, recommend patient to increase Slow-Mag to 2 tablets daily.  #Hypotension, likely secondary to poor oral intake. #Weight loss, poor appetite.  Patient will receive dexamethasone 10 mg IV x1. I recommend patient to start taking oral dexamethasone 4 mg daily.   #Chemotherapy-induced neuropathy, Grade 2-3, bilateral hands and feet.     He only agrees to take $Remov'900mg'VimVgR$  of gabapentin.  Continue cymbalta $RemoveBeforeDEI'30mg'kpgsIQUeMXROujhK$  daily.  He also is on fentanyl patch as well as oxycodone PRN.  We spent sufficient time to discuss many aspect of care, questions were answered to patient's  satisfaction. All questions were answered. The patient knows to call the clinic with any problems questions or concerns.   Earlie Server, MD, PhD 12/07/2019

## 2019-12-10 ENCOUNTER — Telehealth: Payer: Self-pay | Admitting: Hospice and Palliative Medicine

## 2019-12-10 ENCOUNTER — Encounter: Payer: Self-pay | Admitting: Oncology

## 2019-12-10 NOTE — Telephone Encounter (Signed)
I spoke with patient's daughter by phone.  Updated her last visit from last week.  She also feels like patient is declining and likely approaching end-of-life.  She was in agreement with hospice involvement.  I spoke with hospice who will see patient tomorrow.

## 2019-12-13 ENCOUNTER — Other Ambulatory Visit: Payer: Medicare Other | Admitting: Nurse Practitioner

## 2019-12-21 ENCOUNTER — Inpatient Hospital Stay: Payer: Medicare Other | Attending: Hospice and Palliative Medicine | Admitting: Hospice and Palliative Medicine

## 2019-12-21 DIAGNOSIS — Z515 Encounter for palliative care: Secondary | ICD-10-CM

## 2019-12-21 NOTE — Progress Notes (Signed)
Patient is being followed by hospice at home.  I called and spoke with his daughter who says that patient has been resistant to allow hospice to fully help with his care in the home.  He is refused equipment.  Hospice IPU is been discussed as an option due to his weakened state but he is also refused that.  I offered encouragement to the family.  She says the patient is having some pain but this is being managed by hospice.  Encouraged her to call with any questions or concerns.

## 2019-12-24 ENCOUNTER — Other Ambulatory Visit: Payer: Self-pay | Admitting: Hospice and Palliative Medicine

## 2019-12-24 MED ORDER — DEXAMETHASONE 4 MG PO TABS: 8.0000 mg | ORAL_TABLET | Freq: Every day | ORAL | 0 refills | Status: AC

## 2019-12-24 MED ORDER — FENTANYL 25 MCG/HR TD PT72
1.0000 | MEDICATED_PATCH | TRANSDERMAL | 0 refills | Status: DC
Start: 2019-12-24 — End: 2020-01-31

## 2019-12-24 NOTE — Progress Notes (Signed)
I spoke with patient's hospice nurse, Mitzie. She requested refill on the fentanyl patches. She also requested to increase the dexamethasone to 8mg  daily due to pain, nausea, and fatigue. Okay with changes.

## 2020-01-04 ENCOUNTER — Other Ambulatory Visit: Payer: Self-pay | Admitting: Hospice and Palliative Medicine

## 2020-01-04 MED ORDER — OXYCODONE HCL 5 MG PO TABS
5.0000 mg | ORAL_TABLET | ORAL | 0 refills | Status: DC | PRN
Start: 2020-01-04 — End: 2020-01-31

## 2020-01-04 NOTE — Progress Notes (Signed)
I received a call from patient's hospice nurse requesting refill of oxycodone.  She reports that patient is needing it frequently for pain.  Will refill and increase the dose to 5 to 10 mg every 4 hours as needed.

## 2020-01-07 ENCOUNTER — Other Ambulatory Visit: Payer: Self-pay | Admitting: Hospice and Palliative Medicine

## 2020-01-07 ENCOUNTER — Telehealth: Payer: Self-pay | Admitting: Hospice and Palliative Medicine

## 2020-01-07 DIAGNOSIS — C3492 Malignant neoplasm of unspecified part of left bronchus or lung: Secondary | ICD-10-CM

## 2020-01-07 NOTE — Telephone Encounter (Signed)
I received a call from patient's hospice nurse, Ashby Dawes.  Patient has several days of acute dyspnea at rest.  This is new for him.  No fever or chills.  No cough.  No other constitutional symptoms.  She reports on auscultation that there is significant diminishment of air movement on the left.  She asked about starting nebs, which would be fine, but I would recommend pursuing chest x-ray.  She plans to speak with patient to see if he would be interested in imaging.  She will let me know of what he decides.

## 2020-01-07 NOTE — Progress Notes (Signed)
Received a call back from hospice nurse that patient is interested in CXR. Will order.

## 2020-01-08 ENCOUNTER — Encounter: Payer: Self-pay | Admitting: Internal Medicine

## 2020-01-08 ENCOUNTER — Inpatient Hospital Stay
Admission: EM | Admit: 2020-01-08 | Discharge: 2020-01-11 | DRG: 200 | Disposition: A | Discharge status: Home / Self Care | Attending: Family Medicine | Admitting: Family Medicine

## 2020-01-08 ENCOUNTER — Emergency Department

## 2020-01-08 ENCOUNTER — Other Ambulatory Visit: Payer: Self-pay

## 2020-01-08 ENCOUNTER — Ambulatory Visit
Admission: RE | Admit: 2020-01-08 | Discharge: 2020-01-08 | Disposition: A | Discharge status: Home / Self Care | Source: Ambulatory Visit | Attending: Hospice and Palliative Medicine | Admitting: Hospice and Palliative Medicine

## 2020-01-08 ENCOUNTER — Ambulatory Visit
Admission: RE | Admit: 2020-01-08 | Discharge: 2020-01-08 | Disposition: A | Discharge status: Home / Self Care | Source: Home / Self Care | Attending: Hospice and Palliative Medicine | Admitting: Hospice and Palliative Medicine

## 2020-01-08 ENCOUNTER — Telehealth: Payer: Self-pay | Admitting: Hospice and Palliative Medicine

## 2020-01-08 DIAGNOSIS — Z6821 Body mass index (BMI) 21.0-21.9, adult: Secondary | ICD-10-CM | POA: Diagnosis not present

## 2020-01-08 DIAGNOSIS — Z8505 Personal history of malignant neoplasm of liver: Secondary | ICD-10-CM

## 2020-01-08 DIAGNOSIS — Z20822 Contact with and (suspected) exposure to covid-19: Secondary | ICD-10-CM | POA: Diagnosis present

## 2020-01-08 DIAGNOSIS — C3492 Malignant neoplasm of unspecified part of left bronchus or lung: Secondary | ICD-10-CM | POA: Insufficient documentation

## 2020-01-08 DIAGNOSIS — J449 Chronic obstructive pulmonary disease, unspecified: Secondary | ICD-10-CM | POA: Diagnosis not present

## 2020-01-08 DIAGNOSIS — K732 Chronic active hepatitis, not elsewhere classified: Secondary | ICD-10-CM | POA: Diagnosis present

## 2020-01-08 DIAGNOSIS — Z79899 Other long term (current) drug therapy: Secondary | ICD-10-CM

## 2020-01-08 DIAGNOSIS — J439 Emphysema, unspecified: Secondary | ICD-10-CM | POA: Diagnosis present

## 2020-01-08 DIAGNOSIS — C7802 Secondary malignant neoplasm of left lung: Secondary | ICD-10-CM | POA: Diagnosis present

## 2020-01-08 DIAGNOSIS — R64 Cachexia: Secondary | ICD-10-CM | POA: Diagnosis present

## 2020-01-08 DIAGNOSIS — J9383 Other pneumothorax: Secondary | ICD-10-CM | POA: Diagnosis not present

## 2020-01-08 DIAGNOSIS — Z809 Family history of malignant neoplasm, unspecified: Secondary | ICD-10-CM | POA: Diagnosis not present

## 2020-01-08 DIAGNOSIS — Z7982 Long term (current) use of aspirin: Secondary | ICD-10-CM | POA: Diagnosis not present

## 2020-01-08 DIAGNOSIS — C7951 Secondary malignant neoplasm of bone: Secondary | ICD-10-CM | POA: Diagnosis present

## 2020-01-08 DIAGNOSIS — F17210 Nicotine dependence, cigarettes, uncomplicated: Secondary | ICD-10-CM | POA: Diagnosis present

## 2020-01-08 DIAGNOSIS — J930 Spontaneous tension pneumothorax: Principal | ICD-10-CM | POA: Diagnosis present

## 2020-01-08 DIAGNOSIS — R0600 Dyspnea, unspecified: Secondary | ICD-10-CM | POA: Diagnosis present

## 2020-01-08 DIAGNOSIS — I10 Essential (primary) hypertension: Secondary | ICD-10-CM | POA: Diagnosis present

## 2020-01-08 DIAGNOSIS — Z09 Encounter for follow-up examination after completed treatment for conditions other than malignant neoplasm: Secondary | ICD-10-CM

## 2020-01-08 DIAGNOSIS — Z66 Do not resuscitate: Secondary | ICD-10-CM | POA: Diagnosis present

## 2020-01-08 DIAGNOSIS — J939 Pneumothorax, unspecified: Secondary | ICD-10-CM | POA: Diagnosis not present

## 2020-01-08 LAB — COMPREHENSIVE METABOLIC PANEL
ALT: 66 U/L — ABNORMAL HIGH (ref 0–44)
AST: 50 U/L — ABNORMAL HIGH (ref 15–41)
Albumin: 2.5 g/dL — ABNORMAL LOW (ref 3.5–5.0)
Alkaline Phosphatase: 133 U/L — ABNORMAL HIGH (ref 38–126)
Anion gap: 11 (ref 5–15)
BUN: 19 mg/dL (ref 8–23)
CO2: 24 mmol/L (ref 22–32)
Calcium: 8.5 mg/dL — ABNORMAL LOW (ref 8.9–10.3)
Chloride: 102 mmol/L (ref 98–111)
Creatinine, Ser: 0.61 mg/dL (ref 0.61–1.24)
GFR, Estimated: >60 mL/min (ref >60)
Glucose, Bld: 139 mg/dL — ABNORMAL HIGH (ref 70–99)
Potassium: 3.7 mmol/L (ref 3.5–5.1)
Sodium: 137 mmol/L (ref 135–145)
Total Bilirubin: 0.8 mg/dL (ref 0.3–1.2)
Total Protein: 6.5 g/dL (ref 6.5–8.1)

## 2020-01-08 LAB — CBC
HCT: 37.2 % — ABNORMAL LOW (ref 39.0–52.0)
Hemoglobin: 12 g/dL — ABNORMAL LOW (ref 13.0–17.0)
MCH: 28.3 pg (ref 26.0–34.0)
MCHC: 32.3 g/dL (ref 30.0–36.0)
MCV: 87.7 fL (ref 80.0–100.0)
Platelets: 271 10*3/uL (ref 150–400)
RBC: 4.24 MIL/uL (ref 4.22–5.81)
RDW: 22.2 % — ABNORMAL HIGH (ref 11.5–15.5)
WBC: 14.3 10*3/uL — ABNORMAL HIGH (ref 4.0–10.5)
nRBC: 0 % (ref 0.0–0.2)

## 2020-01-08 LAB — APTT: aPTT: 30 seconds (ref 24–36)

## 2020-01-08 LAB — RESP PANEL BY RT-PCR (FLU A&B, COVID) ARPGX2
Influenza A by PCR: NEGATIVE
Influenza B by PCR: NEGATIVE
SARS Coronavirus 2 by RT PCR: NEGATIVE

## 2020-01-08 LAB — PROTIME-INR
INR: 1.1 (ref 0.8–1.2)
Prothrombin Time: 13.5 seconds (ref 11.4–15.2)

## 2020-01-08 MED ORDER — FENTANYL 25 MCG/HR TD PT72
1.0000 | MEDICATED_PATCH | TRANSDERMAL | Status: DC
  Administered 2020-01-09: 12:00:00 1 via TRANSDERMAL
  Filled 2020-01-08: qty 1

## 2020-01-08 MED ORDER — DEXAMETHASONE 4 MG PO TABS
8.0000 mg | ORAL_TABLET | Freq: Every day | ORAL | Status: DC
  Administered 2020-01-09 – 2020-01-11 (×3): 8 mg via ORAL
  Filled 2020-01-08 (×3): qty 2

## 2020-01-08 MED ORDER — FENTANYL CITRATE (PF) 100 MCG/2ML IJ SOLN
50.0000 ug | Freq: Once | INTRAMUSCULAR | Status: AC
  Administered 2020-01-08: 50 ug via INTRAVENOUS
  Filled 2020-01-08: qty 2

## 2020-01-08 MED ORDER — LIDOCAINE-EPINEPHRINE (PF) 2 %-1:200000 IJ SOLN
10.0000 mL | Freq: Once | INTRAMUSCULAR | Status: DC
  Filled 2020-01-08 (×2): qty 10

## 2020-01-08 MED ORDER — OXYCODONE HCL 5 MG PO TABS
5.0000 mg | ORAL_TABLET | ORAL | Status: DC | PRN
  Administered 2020-01-08: 22:00:00 10 mg via ORAL
  Administered 2020-01-09: 5 mg via ORAL
  Administered 2020-01-09 – 2020-01-11 (×7): 10 mg via ORAL
  Filled 2020-01-08 (×4): qty 2
  Filled 2020-01-08: qty 1
  Filled 2020-01-08 (×4): qty 2

## 2020-01-08 MED ORDER — MAGNESIUM CHLORIDE 64 MG PO TBEC
1.0000 | DELAYED_RELEASE_TABLET | Freq: Every day | ORAL | Status: DC
  Administered 2020-01-09 – 2020-01-11 (×3): 64 mg via ORAL
  Filled 2020-01-08 (×3): qty 1

## 2020-01-08 MED ORDER — POTASSIUM CHLORIDE ER 10 MEQ PO TBCR
20.0000 meq | EXTENDED_RELEASE_TABLET | Freq: Every day | ORAL | Status: DC
  Administered 2020-01-09 – 2020-01-11 (×3): 20 meq via ORAL
  Filled 2020-01-08 (×5): qty 2

## 2020-01-08 MED ORDER — DULOXETINE HCL 30 MG PO CPEP
30.0000 mg | ORAL_CAPSULE | Freq: Every day | ORAL | Status: DC
  Administered 2020-01-09 – 2020-01-11 (×3): 30 mg via ORAL
  Filled 2020-01-08 (×3): qty 1

## 2020-01-08 MED ORDER — LIDOCAINE HCL (PF) 1 % IJ SOLN
INTRAMUSCULAR | Status: AC
  Administered 2020-01-08: 8 mL
  Filled 2020-01-08: qty 5

## 2020-01-08 MED ORDER — ENOXAPARIN SODIUM 40 MG/0.4ML ~~LOC~~ SOLN
40.0000 mg | SUBCUTANEOUS | Status: DC
  Administered 2020-01-08: 40 mg via SUBCUTANEOUS
  Filled 2020-01-08: qty 0.4

## 2020-01-08 MED ORDER — SODIUM CHLORIDE 0.9% FLUSH
3.0000 mL | Freq: Two times a day (BID) | INTRAVENOUS | Status: DC
  Administered 2020-01-08 – 2020-01-11 (×6): 3 mL via INTRAVENOUS

## 2020-01-08 MED ORDER — SODIUM CHLORIDE 0.9 % IV SOLN: 250.0000 mL | INTRAVENOUS | Status: DC | PRN

## 2020-01-08 MED ORDER — UMECLIDINIUM BROMIDE 62.5 MCG/INH IN AEPB
1.0000 | INHALATION_SPRAY | Freq: Every day | RESPIRATORY_TRACT | Status: DC
  Administered 2020-01-11: 08:00:00 1 via RESPIRATORY_TRACT
  Filled 2020-01-08 (×3): qty 7

## 2020-01-08 MED ORDER — ASPIRIN EC 81 MG PO TBEC
81.0000 mg | DELAYED_RELEASE_TABLET | Freq: Every day | ORAL | Status: DC
  Administered 2020-01-09 – 2020-01-11 (×3): 81 mg via ORAL
  Filled 2020-01-08 (×3): qty 1

## 2020-01-08 MED ORDER — SENNA 8.6 MG PO TABS
1.0000 | ORAL_TABLET | Freq: Every day | ORAL | Status: DC
  Filled 2020-01-08: qty 1

## 2020-01-08 MED ORDER — TRAZODONE HCL 50 MG PO TABS
25.0000 mg | ORAL_TABLET | Freq: Every evening | ORAL | Status: DC | PRN
  Administered 2020-01-09: 02:00:00 25 mg via ORAL
  Filled 2020-01-08: qty 1

## 2020-01-08 MED ORDER — SODIUM CHLORIDE 0.9% FLUSH: 3.0000 mL | INTRAVENOUS | Status: DC | PRN

## 2020-01-08 MED ORDER — FENTANYL CITRATE (PF) 100 MCG/2ML IJ SOLN
50.0000 ug | Freq: Once | INTRAMUSCULAR | Status: AC
  Administered 2020-01-08: 50 ug via INTRAVENOUS

## 2020-01-08 MED ORDER — ONDANSETRON HCL 4 MG PO TABS: 8.0000 mg | ORAL_TABLET | Freq: Two times a day (BID) | ORAL | Status: DC | PRN
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
## 2020-01-08 NOTE — Telephone Encounter (Signed)
I received a call from outpatient imaging.  X-ray appears to show a large left-sided tension pneumo.  Spoke with patient and girlfriend by phone.  Patient in agreement to seek evaluation in ER and is going there now.  I called and gave report to ER triage nurse and hospice liaison.  Also spoke with patient's hospice nurse by phone.
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
## 2020-01-08 NOTE — ED Notes (Signed)
X-ray at bedside

## 2020-01-08 NOTE — ED Notes (Signed)
ED Provider at bedside. 

## 2020-01-08 NOTE — ED Notes (Signed)
Time out for procedural

## 2020-01-08 NOTE — ED Notes (Signed)
Consent obtained from patient and placed on pt's chart.

## 2020-01-08 NOTE — ED Notes (Signed)
Attempted to call report twice

## 2020-01-08 NOTE — ED Provider Notes (Signed)
[facility] Emergency Department Provider Note   ____________________________________________   First MD Initiated Contact with Patient 01/08/20 [phone]     (approximate)  I have reviewed the triage vital signs and the nursing notes.   HISTORY  Chief Complaint No chief complaint on file.    HPI Samuel Blanck. is a 69 y.o. male there is a history of metastatic cancer, hypertension prior SBO  Patient is currently on hospice care called by the hospice team.  He has been having increasing shortness of breath over the last 2 weeks.  Reports has been trying breathing treatments and yesterday had an x-ray that showed he had a collapsed lung.  He was referred to the ER for evaluation and further management.  He does report shortness of breath.  Progressively worsening over 2 weeks.  He denies fevers or chills.  No chest pain.  Does report shortness of breath.  Was noted by palliative care team to have diminished lung sounds over the left side     Past Medical History:  Diagnosis Date  . Hepatocellular carcinoma metastatic to left lung ([place]) 07/24/2018  . History of angiography    left lower extremity  . Hypertension   . Small bowel obstruction [facility])     Patient Active Problem List   Diagnosis Date Noted  . Pneumothorax, left 01/08/2020  . Hypomagnesemia 12/07/2019  . Weight loss 12/07/2019  . At risk for infection due to chemotherapy 08/14/2019  . Nerve pain 08/14/2019  . Tachycardia 08/14/2019  . Encounter for antineoplastic chemotherapy 07/24/2019  . Anemia 07/12/2019  . Thoracic aortic aneurysm without rupture ([place])- NOTED ON CT 06/30/2018 07/12/2019  . Bone lesion 04/06/2019  . Cancer, metastatic to bone ([place]) 12/16/2018  . Hypotension 10/26/2018  . Screening for malignant neoplasm of prostate 10/05/2018  . Stage IV adenocarcinoma of lung, left ([place]) 09/02/2018  . Chronic active hepatitis ([place]) 07/28/2018  . Elevated PSA 07/28/2018  .  Hepatocellular carcinoma metastatic to left lung ([place]) 07/24/2018  . Paraspinal mass 07/24/2018  . Goals of care, counseling/discussion 07/07/2018  . Liver mass 06/23/2018  . Lung nodule 06/23/2018  . Pulmonary emphysema ([place]) 06/23/2018  . Hypertension 03/26/2016  . Small bowel obstruction ([place])   . Intestinal obstruction ([place]) 09/26/2014  . Essential hypertension 06/19/2014  . Hypokalemia 06/19/2014  . Bowel obstruction ([place]) 06/18/2014    Past Surgical History:  Procedure Laterality Date  . COLON SURGERY    . COLONOSCOPY W/ POLYPECTOMY    . COLONOSCOPY WITH PROPOFOL N/A 01/31/2018   Procedure: COLONOSCOPY WITH PROPOFOL;  Surgeon: Toledo, Benay Pike, MD;  Location: [facility] ENDOSCOPY;  Service: Gastroenterology;  Laterality: N/A;  . debridement fasciotomy leg, left Left 03/19/2016  . LAPAROTOMY N/A 09/27/2014   Procedure: EXPLORATORY LAPAROTOMY;  Surgeon: Dia Crawford III, MD;  Location: [facility] ORS;  Service: General;  Laterality: N/A;  . PORTA CATH INSERTION N/A 04/10/2019   Procedure: PORTA CATH INSERTION;  Surgeon: Katha Cabal, MD;  Location: [place] CV LAB;  Service: Cardiovascular;  Laterality: N/A;    Prior to Admission medications   Medication Sig Start Date End Date Taking? Authorizing Provider  aspirin EC 81 MG tablet Take 1 tablet (81 mg total) by mouth daily. Swallow whole. Patient not taking: Reported on 10/30/2019 10/26/19   Earlie Server, MD  dexamethasone (DECADRON) 4 MG tablet Take 2 tablets (8 mg total) by mouth daily. 12/24/19   Borders, Kirt Boys, NP  DULoxetine (CYMBALTA) 30 MG capsule Take 1 capsule (  30 mg total) by mouth daily. 11/09/19   Earlie Server, MD  fentaNYL (DURAGESIC) 25 MCG/HR Place 1 patch onto the skin every 3 (three) days. 12/24/19   Borders, Kirt Boys, NP  gabapentin (NEURONTIN) 300 MG capsule Take 1 capsule (300 mg total) by mouth 3 (three) times daily. Patient not taking: Reported on 12/07/2019 11/02/19   Earlie Server, MD  hydrochlorothiazide (HYDRODIURIL) 25  MG tablet Take 1 tablet (25 mg total) by mouth daily. Patient not taking: Reported on 11/09/2019 09/12/19   Jerrol Banana., MD  lidocaine-prilocaine (EMLA) cream Apply to affected area once 04/06/19   Earlie Server, MD  loperamide (IMODIUM) 2 MG capsule Take 1 capsule (2 mg total) by mouth See admin instructions. With onset of loose stool, take 7m followed by 269mevery 2 hours until 12 hours have passed without loose bowel movement. Maximum: 16 mg/day Patient not taking: Reported on 10/26/2019 09/08/18   YuEarlie ServerMD  magnesium chloride (SLOW-MAG) 64 MG TBEC SR tablet Take 1 tablet (64 mg total) by mouth daily. 10/26/19   YuEarlie ServerMD  megestrol (MEGACE) 40 MG/ML suspension Take 10 mLs (400 mg total) by mouth daily. Patient not taking: Reported on 12/07/2019 10/17/19   Borders, JoKirt BoysNP  Multiple Vitamin (MULTIVITAMIN) capsule Take 1 capsule by mouth daily.    [provider]  NARCAN 4 MG/0.1ML LIQD nasal spray kit 1 spray once.  Patient not taking: Reported on 09/25/2019 04/12/19   [provider]  ondansetron (ZOFRAN) 8 MG tablet Take 1 tablet (8 mg total) by mouth 2 (two) times daily as needed for refractory nausea / vomiting. Start on day 3 after carboplatin chemo. Patient not taking: Reported on 10/26/2019 04/06/19   YuEarlie ServerMD  oxyCODONE (ROXICODONE) 5 MG immediate release tablet Take 1-2 tablets (5-10 mg total) by mouth every 4 (four) hours as needed for severe pain or breakthrough pain. 01/04/20   Borders, JoKirt BoysNP  polyethylene glycol (MIRALAX / GLYCOLAX) 17 g packet Take 17 g by mouth daily. Patient not taking: Reported on 10/26/2019 05/14/19   FoHinda KehrMD  potassium chloride (KLOR-CON) 10 MEQ tablet Take 2 tablets (20 mEq total) by mouth daily. 11/02/19   YuEarlie ServerMD  prochlorperazine (COMPAZINE) 10 MG tablet Take 1 tablet (10 mg total) by mouth every 6 (six) hours as needed (Nausea or vomiting). Patient not taking: Reported on 10/26/2019 04/06/19   YuEarlie ServerMD    senna (SENOKOT) 8.6 MG TABS tablet Take 1 tablet (8.6 mg total) by mouth daily. Patient not taking: Reported on 10/26/2019 10/12/18   BuJacquelin HawkingNP    Allergies 5-alpha reductase inhibitors  Family History  Problem Relation Age of Onset  . Cancer Mother   . Cancer Father     Social History Social History   Tobacco Use  . Smoking status: Current Every Day Smoker    Packs/day: 0.25    Years: 50.00    Pack years: 12.50  . Smokeless tobacco: Never Used  . Tobacco comment: 3 cigarettes per day  Vaping Use  . Vaping Use: Never used  Substance Use Topics  . Alcohol use: Not Currently    Alcohol/week: 35.0 standard drinks    Types: 30 Cans of beer, 5 Shots of liquor per week  . Drug use: No    Review of Systems Constitutional: No fever/chills Eyes: No visual changes. Cardiovascular: Denies chest pain. Respiratory: See HPI Gastrointestinal: No abdominal pain.   Genitourinary: Negative for dysuria.  Musculoskeletal: Negative for back pain. Skin: Negative for rash. Neurological: Negative for headaches, areas of focal weakness or numbness.    ____________________________________________   PHYSICAL EXAM:  VITAL SIGNS: ED Triage Vitals  Enc Vitals Group     BP --      Pulse Rate 01/08/20 1615 (!) 102     Resp 01/08/20 1615 (!) 22     Temp --      Temp src --      SpO2 01/08/20 1615 100 %     Weight 01/08/20 1615 115 lb (52.2 kg)     Height 01/08/20 1615 5' 5" (1.651 m)     Head Circumference --      Peak Flow --      Pain Score --      Pain Loc --      Pain Edu? --      Excl. in [place]? --     Constitutional: Alert and oriented.  Somewhat cachectic in appearance. Eyes: Conjunctivae are normal. Head: Atraumatic. Nose: No congestion/rhinnorhea. Mouth/Throat: Mucous membranes are moist. Neck: No stridor.  Cardiovascular: Normal rate, regular rhythm. Grossly normal heart sounds.  Good peripheral circulation. Respiratory: Just slight tachypnea.  No severe  distress or extremitas.  Currently on nonrebreather to address pneumothorax.  No JVD.  He does not have obvious signs of tension pneumothorax or tension physiology at this time.  He is normotensive.  He does have significantly diminished lung sounds over the left, clear lungs on the right Gastrointestinal: Soft and nontender. No distention. Musculoskeletal: No lower extremity tenderness nor edema. Neurologic:  Normal speech and language. No gross focal neurologic deficits are appreciated.  Skin:  Skin is warm, dry and intact. No rash noted. Psychiatric: Mood and affect are normal. Speech and behavior are normal.  ____________________________________________   LABS (all labs ordered are listed, but only abnormal results are displayed)  Labs Reviewed  COMPREHENSIVE METABOLIC PANEL - Abnormal; Notable for the following components:      Result Value   Glucose, Bld 139 (*)    Calcium 8.5 (*)    Albumin 2.5 (*)    AST 50 (*)    ALT 66 (*)    Alkaline Phosphatase 133 (*)    All other components within normal limits  CBC - Abnormal; Notable for the following components:   WBC 14.3 (*)    Hemoglobin 12.0 (*)    HCT 37.2 (*)    RDW 22.2 (*)    All other components within normal limits  RESP PANEL BY RT-PCR (FLU A&B, COVID) ARPGX2  PROTIME-INR  APTT   ____________________________________________  EKG  Reviewed entered by me at 1835 Heart rate 89 QRS 80 QTc 440 Normal sinus rhythm, possible old anteroseptal infarct, no evidence of obvious acute ischemia ____________________________________________  RADIOLOGY  DG Chest 2 View  Addendum Date: 01/08/2020   ADDENDUM REPORT: 01/08/2020 15:31 ADDENDUM: Critical Value/emergent results were called by me on 01/08/2020 at 3:31 pm to provider Rulon Abide, who verbally acknowledged these results. Electronically Signed   By: Misty Stanley M.D.   On: 01/08/2020 15:31   Result Date: 01/08/2020 CLINICAL DATA:  Shortness of breath. EXAM: CHEST -  2 VIEW COMPARISON:  03/24/2019 FINDINGS: Large basilar predominant left pneumothorax. Right lung clear. Mild shift of cardiomediastinal anatomy to the right. Right Port-A-Cath tip overlies the right innominate vein, likely above the innominate vein confluence. No worrisome lytic or sclerotic osseous abnormality. IMPRESSION: Large basilar predominant left pneumothorax with mild shift of cardiomediastinal anatomy  to the right. Component of tension pneumothorax not excluded. Electronically Signed: By: Misty Stanley M.D. On: 01/08/2020 15:03   DG Chest Portable 1 View  Result Date: 01/08/2020 CLINICAL DATA:  Chest tube placement EXAM: PORTABLE CHEST 1 VIEW COMPARISON:  Portable exam 1753 hours compared to 1429 hours FINDINGS: RIGHT jugular Port-A-Cath with tip projecting over confluence of SVC. New pigtail LEFT thoracostomy tube with decrease in LEFT pneumothorax. Normal heart size and mediastinal contours. Again identified bulla at LEFT apex with scattered atelectasis. Underlying severe emphysematous changes. No pleural effusion or acute osseous findings. IMPRESSION: Significant decrease in LEFT pneumothorax post thoracostomy tube placement. Again identified emphysematous changes with extensive bullous disease of the LEFT upper lobe. Electronically Signed   By: Lavonia Dana M.D.   On: 01/08/2020 18:29     Both chest x-rays reviewed by me, post thoracostomy chest x-ray appears significantly improved. ____________________________________________   PROCEDURES  Procedure(s) performed: Thoracostomy, left chest  CHEST TUBE INSERTION  Date/Time: 01/08/2020 7:08 PM Performed by: Delman Kitten, MD Authorized by: Delman Kitten, MD   Consent:    Consent obtained:  Written   Consent given by:  Patient   Risks discussed:  Bleeding, incomplete drainage, nerve damage, pain, infection and damage to surrounding structures   Alternatives discussed:  Delayed treatment Pre-procedure details:    Skin preparation:   Betadine (full prep)   Preparation: Patient was prepped and draped in the usual sterile fashion   Anesthesia (see MAR for exact dosages):    Anesthesia method:  Local infiltration   Local anesthetic:  Lidocaine 1% WITH epi (8 ml) Procedure details:    Placement location:  L lateral   Scalpel size:  11   Tube size ([place]): 14 pigtail.   Ultrasound guidance: no     Tension pneumothorax: no     Tube connected to:  Suction and water seal   Drainage characteristics:  Air only   Suture material:  2-0 silk   Dressing:  4x4 sterile gauze (foam dressing over) Post-procedure details:    Post-insertion x-ray findings: tube in good position     Patient tolerance of procedure:  Tolerated well, no immediate complications    Critical Care performed: Yes, see critical care note(s)  CRITICAL CARE Performed by: Delman Kitten   Total critical care time: 25 minutes  Critical care time was exclusive of separately billable procedures and treating other patients.  Critical care was necessary to treat or prevent imminent or life-threatening deterioration.  Critical care was time spent personally by me on the following activities: development of treatment plan with patient and/or surrogate as well as nursing, discussions with consultants, evaluation of patient's response to treatment, examination of patient, obtaining history from patient or surrogate, ordering and performing treatments and interventions, ordering and review of laboratory studies, ordering and review of radiographic studies, pulse oximetry and re-evaluation of patient's condition.  Large pneumothorax started develop possible early tension physiology including tachycardia, no hypotension however.  Concern for immediate life threat given the size pneumothorax associated with dyspnea, tachypnea and mild tachycardia ____________________________________________   INITIAL IMPRESSION / ASSESSMENT AND PLAN / ED COURSE  Pertinent labs & imaging  results that were available during my care of the patient were reviewed by me and considered in my medical decision making (see chart for details).   Patient presents for what appears to be symptomatic left-sided pneumothorax.  Discussed this with Dr. Marta Lamas at 4:15 PM as when I review his imaging it looks that there is some  expansion of his left upper lung, and Dr. Genevive Bi advises it does appear somewhat unusual in the anatomy of his pneumothorax wherein he recommends that thoracostomy such as pigtail catheter be placed, and advises to see if interventional radiology may be available to place it, if not would advise ED physician and that I would attempt to decompress  He has a normal platelet count on his previous check, he does not take any blood thinners or anticoagulants but does take aspirin  Clinical Course as of Jan 07 1909  Tue Jan 08, 2020  1656 Discussed risks, benefits, and all major complications as well as minor as well as alternatives to left chest tube insertion.  Patient verbally consents to chest tube placement.   [MQ]  1716 Patient signed consent form for treatment of left-sided pneumothorax   [MQ]    Clinical Course User Index [MQ] Delman Kitten, MD    ----------------------------------------- 7:10 PM on 01/08/2020 -----------------------------------------  Patient improved, hemodynamics normalized.  Resting comfortably.  Pain controlled.  Awaiting admission to hospitalist service, have discussed with general surgery who will follow in consult regarding chest tube and pneumothorax, discussed case with Dr. Dahlia Byes (via [facility] [address])  Samuel Aho. was evaluated in Emergency Department on 01/08/2020 for the symptoms described in the history of present illness. He was evaluated in the context of the global COVID-19 pandemic, which necessitated consideration that the patient might be at risk for infection with the SARS-CoV-2 virus that causes COVID-19. Institutional protocols and  algorithms that pertain to the evaluation of patients at risk for COVID-19 are in a state of rapid change based on information released by regulatory bodies including the CDC and federal and state organizations. These policies and algorithms were followed during the patient's care in the ED.   Patient understand agreeable plan for admission ____________________________________________   FINAL CLINICAL IMPRESSION(S) / ED DIAGNOSES  Final diagnoses:  Dyspnea  Pneumothorax, left        Note:  This document was prepared using Dragon voice recognition software and may include unintentional dictation errors       Delman Kitten, MD 01/08/20 1911

## 2020-01-08 NOTE — ED Notes (Signed)
Called xray for chest tube confirmation

## 2020-01-08 NOTE — ED Notes (Signed)
Pt seen Yesterday by hospice  Care at home , was advised to get an xray do to pt being sob.

## 2020-01-08 NOTE — Progress Notes (Addendum)
[facility] Liaison note:  [facility]) emergency room visit. Patient is currently followed by [facility] hospice services at home with a hospice diagnosis of    Malignant neoplasm of unspecified part of left bronchus or lung. He is a DNR code with out of facility DNR in place in the home. Mr. Osoria was sent to the [facility] ED today for possible chest tube placement for treatment of a left pneumothorax noted on outpatient xray today. Patient seen lying on the emergency room stretcher, alert and oriented, currently on a non rebreather mask. Of note patient does not require oxygen at baseline. Plan at this time is for placement of a chest tube in interventional radiology.  Hospice team updated. Will continue to follow. Flo Shanks BSN, RN, [place] [phone]

## 2020-01-09 ENCOUNTER — Inpatient Hospital Stay

## 2020-01-09 DIAGNOSIS — J9383 Other pneumothorax: Secondary | ICD-10-CM

## 2020-01-09 DIAGNOSIS — J449 Chronic obstructive pulmonary disease, unspecified: Secondary | ICD-10-CM

## 2020-01-09 DIAGNOSIS — K732 Chronic active hepatitis, not elsewhere classified: Secondary | ICD-10-CM

## 2020-01-09 DIAGNOSIS — J939 Pneumothorax, unspecified: Secondary | ICD-10-CM | POA: Diagnosis not present

## 2020-01-09 LAB — BASIC METABOLIC PANEL
Anion gap: 12 (ref 5–15)
BUN: 21 mg/dL (ref 8–23)
CO2: 24 mmol/L (ref 22–32)
Calcium: 8.3 mg/dL — ABNORMAL LOW (ref 8.9–10.3)
Chloride: 102 mmol/L (ref 98–111)
Creatinine, Ser: 0.68 mg/dL (ref 0.61–1.24)
GFR, Estimated: >60 mL/min (ref >60)
Glucose, Bld: 117 mg/dL — ABNORMAL HIGH (ref 70–99)
Potassium: 3.7 mmol/L (ref 3.5–5.1)
Sodium: 138 mmol/L (ref 135–145)

## 2020-01-09 LAB — CBC WITH DIFFERENTIAL/PLATELET
Abs Immature Granulocytes: 0.99 10*3/uL — ABNORMAL HIGH (ref 0.00–0.07)
Basophils Absolute: 0.1 10*3/uL (ref 0.0–0.1)
Basophils Relative: 0 %
Eosinophils Absolute: 0 10*3/uL (ref 0.0–0.5)
Eosinophils Relative: 0 %
HCT: 38.2 % — ABNORMAL LOW (ref 39.0–52.0)
Hemoglobin: 12.3 g/dL — ABNORMAL LOW (ref 13.0–17.0)
Immature Granulocytes: 7 %
Lymphocytes Relative: 7 %
Lymphs Abs: 1 10*3/uL (ref 0.7–4.0)
MCH: 28.1 pg (ref 26.0–34.0)
MCHC: 32.2 g/dL (ref 30.0–36.0)
MCV: 87.4 fL (ref 80.0–100.0)
Monocytes Absolute: 0.6 10*3/uL (ref 0.1–1.0)
Monocytes Relative: 4 %
Neutro Abs: 11.8 10*3/uL — ABNORMAL HIGH (ref 1.7–7.7)
Neutrophils Relative %: 82 %
Platelets: 270 10*3/uL (ref 150–400)
RBC: 4.37 MIL/uL (ref 4.22–5.81)
RDW: 22.3 % — ABNORMAL HIGH (ref 11.5–15.5)
Smear Review: UNDETERMINED
WBC: 14.4 10*3/uL — ABNORMAL HIGH (ref 4.0–10.5)
nRBC: 0.1 % (ref 0.0–0.2)

## 2020-01-09 LAB — MAGNESIUM: Magnesium: 1.9 mg/dL (ref 1.7–2.4)

## 2020-01-09 MED ORDER — CHLORHEXIDINE GLUCONATE CLOTH 2 % EX PADS
6.0000 | MEDICATED_PAD | Freq: Every day | CUTANEOUS | Status: DC
  Administered 2020-01-09 – 2020-01-10 (×2): 6 via TOPICAL

## 2020-01-09 NOTE — Progress Notes (Signed)
[facility] hospital Liaison note:  Patient is currently followed by [facility] hospice services at home with a hospice diagnosis of    Malignant neoplasm of unspecified part of left bronchus or lung. He is a DNR code with out of facility DNR in place in the home.  This is a related admission per hospice physician Dr. Jewel Baize.  Patient came to the [facility] emergency room yesterday 11/23 following an out patient xray that showed a left pneumothorax. Patient was experiencing severe shortness of breath and had required a non-rebreather mask the Emergency room.  He had spoken to his hospice nurse and the oncology [facility] NP Josh Borders prior to his coming for treatment.  Left chest tube was placed. Repeat chest x-ray showed reexpansion of the lung.  Visited patient at bedside this morning and gain this afternoon. He was alert and oriented, speech some what difficult to understand. Chest tube note to left chest to wall suction. Patient reports that the plan is for the tube to remain on suction today with plan to change to water seal tomorrow and discharge home on Friday.  Confirmed with bedside RN. Patient is no longer on supplemental oxygen  He was calm, but very insistent on returning home Friday. Emotional support provided. Patient denied pain at time of visit. Currently pain is being managed with PRN oxycodone and a 25 mcg fentanyl patch.  Chart notes reviewed and report exchanged with bedside RN Vanita Ingles.  Liaison will continue to follow and assist with discharge planning.  VS: 98.7 oral, 111/65, 99, 20 sats 94 on room air (patient is not on oxygen at baseline)  I/O -710  Abnormal labs: Glucose: 117 (H) Calcium: 8.3 (L) WBC: 14.4 (H) Hemoglobin: 12.3 (L) HCT: 38.2 (L) RDW: 22.3 (H) NEUT#: 11.8 (H) WBC Morphology: MILD LEFT SHIFT (1-5% METAS, OCC MYELO, OCC BANDS) Smear Review: PLATELET CLUMPS NOTED ON SMEAR, UNABLE TO ESTIMATE  Diagnostics: CLINICAL  DATA:  Chest tube placement EXAM: PORTABLE CHEST 1 VIEW COMPARISON:  Portable exam 1753 hours compared to 1429 hours IMPRESSION: Significant decrease in LEFT pneumothorax post thoracostomy tube placement. Again identified emphysematous changes with extensive bullous disease of the LEFT upper lobe. Electronically Signed   By: Lavonia Dana M.D.  On: 01/08/2020 18:29  CLINICAL DATA:  Pneumothorax.  Left lung cancer.  EXAM:PORTABLE CHEST 1 VIEW COMPARISON:  One-view chest x-ray 01/08/2020 IMPRESSION: 1. Increasing interstitial and airspace disease in the left lower lobe. This likely represents edema within the re-expanded lung. Infection is considered less likely. 2. Stable left-sided chest tube without recurrent pneumothorax. 3. Emphysema. Electronically Signed By: Wynetta Fines.D.On: 01/09/2020 06:03  IV/PRN medications: oxycodone 5-10 mg q 4 hrs PRN, patient has had 2 doses today, Trazodone 25 mg 1 dose overnight   Problem list: Active Problems:   Essential hypertension   Pulmonary emphysema (HCC)   Chronic active hepatitis ([place])   Stage IV adenocarcinoma of lung, left (HCC)   Pneumothorax, left Spontaneous left pneumothorax: Chest tube in place, managed by CT surgery.  Continue on waterseal.  Appreciate CT surgery help. COPD: Continue LABA. Stage IV lung cancer with mets: Not on chemo currently.  He is a hospice patient. Essential hypertension: Stable.  Continue home medications.  Discharge planning: Plan is for discharge back home on Friday with continued hospice services. Family contact: spoke directly to patient and to son in law at bedside.  IDG: updated Goals of Care: Patient has an out of facility DNR in place in the home.  Medication list and transfer summary in place on shadow chart.  Please call with any hospice related questions or concerns.  Flo Shanks BSN, RN, [place] [phone]

## 2020-01-09 NOTE — Progress Notes (Signed)
Patient ID: Samuel Aho., male   DOB: 01/01/1951, 69 y.o.   MRN: [id number] [facility] Follow Up Note  Patient ID: Samuel Aho., male   DOB: 08/01/1950, 69 y.o.   MRN: [id number]  HISTORY: No complaints today.  He states that he wants to go home.  He is quite agitated and upset with the care he has received.    Vitals:   01/09/20 0004 01/09/20 0437  BP: 110/79 103/68  Pulse: (!) 109 (!) 108  Resp: 20 20  Temp: 98.3 F (36.8 C) 97.6 F (36.4 C)  SpO2:  97%     EXAM:  Resp: Lungs are very distant bilaterally.  No respiratory distress, normal effort. Heart:  Regular without murmurs Abd:  Abdomen is soft, non distended and non tender. No masses are palpable.  There is no rebound and no guarding.  Neurological: Alert and oriented to person, place, and time. Coordination normal.  Skin: Skin is warm and dry. No rash noted. No diaphoretic. No erythema. No pallor.   There is no air leak with a vigorous cough  Independent review of his chest x-ray shows prompt reexpansion of the lung with some reexpansion pulmonary edema in the left lower lobe.   ASSESSMENT: Spontaneous pneumothorax in the setting of severe COPD   PLAN:   I would recommend that we leave the chest tube on suction today.  We placed the chest tube to waterseal tomorrow and if after 24 hours the lung remains inflated on waterseal he can have the chest tube removed.    Nestor Lewandowsky, MD

## 2020-01-09 NOTE — TOC Initial Note (Signed)
Transition of Care (TOC) - Initial/Assessment Note    Patient Details  Name: Samuel Hood. MRN: [id number] Date of Birth: 11/02/1950  [facility] CM/SW Contact:    Beverly Sessions, RN Phone Number: 01/09/2020, 3:28 PM  Clinical Narrative:                 Patient admitted from home with SOB Patient with high risk for readmission score Patient declines for TOC to complete assessment at this time.  Son in law at bed side.  He states that patient lives at home with his girlfriend.  Girlfriend provides transportation to appointments.    PCP [employer]  Son in law is not aware if patient has issues obtaining home medications or if he has and DME at home.  He requests that I call patients daughter Selena Lesser [phone].  Voicemail left  Patient is followed by hospice at home.  Santiago Glad with Manufacturing engineer aware of admission   Expected Discharge Plan: [place]     Patient Goals and CMS Choice        Expected Discharge Plan and Services Expected Discharge Plan: [place]   Discharge Planning Services: CM Consult   Living arrangements for the past 2 months: Single Family Home                                      Prior Living Arrangements/Services Living arrangements for the past 2 months: Single Family Home Lives with:: Significant Other Patient language and need for interpreter reviewed:: Yes        Need for Family Participation in Patient Care: Yes (Comment) Care giver support system in place?: Yes (comment)   Criminal Activity/Legal Involvement Pertinent to Current Situation/Hospitalization: No - Comment as needed  Activities of Daily Living Home Assistive Devices/Equipment: Cane (specify quad or straight) ADL Screening (condition at time of admission) Patient's cognitive ability adequate to safely complete daily activities?: Yes Is the patient deaf or have difficulty hearing?: No Does the patient have  difficulty seeing, even when wearing glasses/contacts?: No Does the patient have difficulty concentrating, remembering, or making decisions?: No Patient able to express need for assistance with ADLs?: Yes Does the patient have difficulty dressing or bathing?: No Independently performs ADLs?: Yes (appropriate for developmental age) Does the patient have difficulty walking or climbing stairs?: Yes Weakness of Legs: Both Weakness of Arms/Hands: None  Permission Sought/Granted                  Emotional Assessment         Alcohol / Substance Use: Not Applicable Psych Involvement: No (comment)  Admission diagnosis:  Dyspnea [R06.00] Pneumothorax, left [J93.9] Pneumothorax [J93.9] Patient Active Problem List   Diagnosis Date Noted  . Pneumothorax, left 01/08/2020  . Hypomagnesemia 12/07/2019  . Weight loss 12/07/2019  . At risk for infection due to chemotherapy 08/14/2019  . Nerve pain 08/14/2019  . Tachycardia 08/14/2019  . Encounter for antineoplastic chemotherapy 07/24/2019  . Anemia 07/12/2019  . Thoracic aortic aneurysm without rupture ([place])- NOTED ON CT 06/30/2018 07/12/2019  . Bone lesion 04/06/2019  . Cancer, metastatic to bone ([place]) 12/16/2018  . Hypotension 10/26/2018  . Screening for malignant neoplasm of prostate 10/05/2018  . Stage IV adenocarcinoma of lung, left ([place]) 09/02/2018  . Chronic active hepatitis ([place]) 07/28/2018  . Elevated PSA 07/28/2018  . Hepatocellular carcinoma metastatic  to left lung ([place]) 07/24/2018  . Paraspinal mass 07/24/2018  . Goals of care, counseling/discussion 07/07/2018  . Liver mass 06/23/2018  . Lung nodule 06/23/2018  . Pulmonary emphysema ([place]) 06/23/2018  . Hypertension 03/26/2016  . Small bowel obstruction ([place])   . Intestinal obstruction ([place]) 09/26/2014  . Essential hypertension 06/19/2014  . Hypokalemia 06/19/2014  . Bowel obstruction ([place]) 06/18/2014   PCP:  Jerrol Banana., MD Pharmacy:   [name]  [address] [place] [address] [place]) [place] [address] Phone: [phone] Fax: [place], [place] [address] Phone: [phone] Fax: [phone]     Social Determinants of Health (SDOH) Interventions    Readmission Risk Interventions Readmission Risk Prevention Plan 01/09/2020  Transportation Screening Complete  HRI or [place] (No Data)  Palliative Care Screening Complete  Medication Review (RN Care Manager) Complete  Some recent data might be hidden

## 2020-01-09 NOTE — Progress Notes (Signed)
PROGRESS NOTE    Samuel Hood.  XBM:[id number] DOB: March 29, 1950 DOA: 01/08/2020 PCP: Jerrol Banana., MD   Brief Narrative:   HPI: Samuel Hood. is a 68 y.o. male with medical history significant of lung cancer with mets to spine, perineum on Hospice for stg IV disease, Hepatocellular carcinoma, HTN was seen by hospice today for increased SOB. He was referred to [facility] for further evaluation.    ED Course: HR 102, 107/89  RR 17. CXR with large left pneumothorax. Cmet with glucose of 139, Albumin 2.5. Pig-tail catheter placed left chest with re-expansion left lung. Surgery consulted, Dr. Dahlia Byes, who will follow but requested medicine admit patient due to complex medical condition. TRH called to admit  Assessment & Plan:   Active Problems:   Essential hypertension   Pulmonary emphysema (H[facility])   Chronic active hepatitis ([place])   Stage IV adenocarcinoma of lung, left ([place])   Pneumothorax, left  Spontaneous left pneumothorax: Chest tube in place, managed by CT surgery.  Continue on waterseal.  Appreciate CT surgery help.  COPD: Continue LABA.  Stage IV lung cancer with mets: Not on chemo currently.  He is a hospice patient.  Essential hypertension: Stable.  Continue home medications.  DVT prophylaxis:    Code Status: DNR  Family Communication: None present at bedside.  Plan of care discussed with patient in length and he verbalized understanding and agreed with it.  Status is: Inpatient  Remains inpatient appropriate because:Inpatient level of care appropriate due to severity of illness   Dispo: The patient is from: Home              Anticipated d/c is to: Home              Anticipated d/c date is: 2 days              Patient currently is not medically stable to d/c.        Estimated body mass index is 21.64 kg/m as calculated from the following:   Height as of this encounter: 5\' 5"  (1.651 m).   Weight as of this encounter: 59 kg.      Nutritional  status:               Consultants:   CT surgery  Procedures:   Chest tube placement  Antimicrobials:  Anti-infectives (From admission, onward)   None         Subjective: Seen and examined.  No complaints other than some pain at chest tube placement site.  Denies shortness of breath.  Objective: Vitals:   01/08/20 2114 01/09/20 0004 01/09/20 0437 01/09/20 1100  BP: 123/86 110/79 103/68 111/65  Pulse: 66 (!) 109 (!) 108 99  Resp: 16 20 20 20   Temp:  98.3 F (36.8 C) 97.6 F (36.4 C) 98.7 F (37.1 C)  TempSrc: Oral Oral Oral   SpO2: 98%  97% 94%  Weight: 59 kg     Height: 5\' 5"  (1.651 m)       Intake/Output Summary (Last 24 hours) at 01/09/2020 1303 Last data filed at 01/09/2020 0900 Gross per 24 hour  Intake --  Output 710 ml  Net -710 ml   Filed Weights   01/08/20 1615 01/08/20 2114  Weight: 52.2 kg 59 kg    Examination:  General exam: Appears calm and comfortable  Respiratory system: Clear to auscultation. Respiratory effort normal. Cardiovascular system: S1 & S2 heard, RRR. No JVD, murmurs, rubs, gallops or clicks.  No pedal edema. Gastrointestinal system: Abdomen is nondistended, soft and nontender. No organomegaly or masses felt. Normal bowel sounds heard. Central nervous system: Alert and oriented. No focal neurological deficits. Extremities: Symmetric 5 x 5 power. Skin: No rashes, lesions or ulcers Psychiatry: Judgement and insight appear normal. Mood & affect appropriate.    Data Reviewed: I have personally reviewed following labs and imaging studies  CBC: Recent Labs  Lab 01/08/20 1832 01/09/20 0853  WBC 14.3* 14.4*  NEUTROABS  --  11.8*  HGB 12.0* 12.3*  HCT 37.2* 38.2*  MCV 87.7 87.4  PLT 271 789   Basic Metabolic Panel: Recent Labs  Lab 01/08/20 1619 01/09/20 0853  NA 137 138  K 3.7 3.7  CL 102 102  CO2 24 24  GLUCOSE 139* 117*  BUN 19 21  CREATININE 0.61 0.68  CALCIUM 8.5* 8.3*  MG  --  1.9   GFR: Estimated  Creatinine Clearance: 74.8 mL/min (by C-G formula based on SCr of 0.68 mg/dL). Liver Function Tests: Recent Labs  Lab 01/08/20 1619  AST 50*  ALT 66*  ALKPHOS 133*  BILITOT 0.8  PROT 6.5  ALBUMIN 2.5*   No results for input(s): LIPASE, AMYLASE in the last 168 hours. No results for input(s): AMMONIA in the last 168 hours. Coagulation Profile: Recent Labs  Lab 01/08/20 1832  INR 1.1   Cardiac Enzymes: No results for input(s): CKTOTAL, CKMB, CKMBINDEX, TROPONINI in the last 168 hours. BNP (last 3 results) No results for input(s): PROBNP in the last 8760 hours. HbA1C: No results for input(s): HGBA1C in the last 72 hours. CBG: No results for input(s): GLUCAP in the last 168 hours. Lipid Profile: No results for input(s): CHOL, HDL, LDLCALC, TRIG, CHOLHDL, LDLDIRECT in the last 72 hours. Thyroid Function Tests: No results for input(s): TSH, T4TOTAL, FREET4, T3FREE, THYROIDAB in the last 72 hours. Anemia Panel: No results for input(s): VITAMINB12, FOLATE, FERRITIN, TIBC, IRON, RETICCTPCT in the last 72 hours. Sepsis Labs: No results for input(s): PROCALCITON, LATICACIDVEN in the last 168 hours.  Recent Results (from the past 240 hour(s))  Resp Panel by RT-PCR (Flu A&B, Covid) Nasopharyngeal Swab     Status: None   Collection Time: 01/08/20  6:32 PM   Specimen: Nasopharyngeal Swab; Nasopharyngeal(NP) swabs in vial transport medium  Result Value Ref Range Status   SARS Coronavirus 2 by RT PCR NEGATIVE NEGATIVE Final    Comment: (NOTE) SARS-CoV-2 target nucleic acids are NOT DETECTED.  The SARS-CoV-2 RNA is generally detectable in upper respiratory specimens during the acute phase of infection. The lowest concentration of SARS-CoV-2 viral copies this assay can detect is 138 copies/mL. A negative result does not preclude SARS-Cov-2 infection and should not be used as the sole basis for treatment or other patient management decisions. A negative result may occur with  improper  specimen collection/handling, submission of specimen other than nasopharyngeal swab, presence of viral mutation(s) within the areas targeted by this assay, and inadequate number of viral copies(<138 copies/mL). A negative result must be combined with clinical observations, patient history, and epidemiological information. The expected result is Negative.  Fact Sheet for Patients:  EntrepreneurPulse.com.au  Fact Sheet for Healthcare Providers:  IncredibleEmployment.be  This test is no t yet approved or cleared by the [place] FDA and  has been authorized for detection and/or diagnosis of SARS-CoV-2 by FDA under an Emergency Use Authorization (EUA). This EUA will remain  in effect (meaning this test can be used) for the duration of the COVID-19 declaration  under Section 564(b)(1) of the Act, 21 U.S.C.section 360bbb-3(b)(1), unless the authorization is terminated  or revoked sooner.       Influenza A by PCR NEGATIVE NEGATIVE Final   Influenza B by PCR NEGATIVE NEGATIVE Final    Comment: (NOTE) The Xpert Xpress SARS-CoV-2/FLU/RSV plus assay is intended as an aid in the diagnosis of influenza from Nasopharyngeal swab specimens and should not be used as a sole basis for treatment. Nasal washings and aspirates are unacceptable for Xpert Xpress SARS-CoV-2/FLU/RSV testing.  Fact Sheet for Patients: EntrepreneurPulse.com.au  Fact Sheet for Healthcare Providers: IncredibleEmployment.be  This test is not yet approved or cleared by the [place] FDA and has been authorized for detection and/or diagnosis of SARS-CoV-2 by FDA under an Emergency Use Authorization (EUA). This EUA will remain in effect (meaning this test can be used) for the duration of the COVID-19 declaration under Section 564(b)(1) of the Act, 21 U.S.C. section 360bbb-3(b)(1), unless the authorization is terminated or revoked.  Performed at  [facility], [address], [place], [place] [address]       Radiology Studies: DG Chest 2 View  Addendum Date: 01/08/2020   ADDENDUM REPORT: 01/08/2020 15:31 ADDENDUM: Critical Value/emergent results were called by me on 01/08/2020 at 3:31 pm to provider Rulon Abide, who verbally acknowledged these results. Electronically Signed   By: Misty Stanley M.D.   On: 01/08/2020 15:31   Result Date: 01/08/2020 CLINICAL DATA:  Shortness of breath. EXAM: CHEST - 2 VIEW COMPARISON:  03/24/2019 FINDINGS: Large basilar predominant left pneumothorax. Right lung clear. Mild shift of cardiomediastinal anatomy to the right. Right Port-A-Cath tip overlies the right innominate vein, likely above the innominate vein confluence. No worrisome lytic or sclerotic osseous abnormality. IMPRESSION: Large basilar predominant left pneumothorax with mild shift of cardiomediastinal anatomy to the right. Component of tension pneumothorax not excluded. Electronically Signed: By: Misty Stanley M.D. On: 01/08/2020 15:03   Portable chest 1 View  Result Date: 01/09/2020 CLINICAL DATA:  Pneumothorax.  Left lung cancer. EXAM: PORTABLE CHEST 1 VIEW COMPARISON:  One-view chest x-ray 01/08/2020 FINDINGS: Right IJ Port-A-Cath is stable. Left-sided chest tube remains in place. No recurrent pneumothorax is present. Increasing interstitial and airspace disease is noted in the left lower lobe. Bullous disease is again noted at the left apex. Emphysematous changes are present on the right without focal airspace disease. Heart size is normal. IMPRESSION: 1. Increasing interstitial and airspace disease in the left lower lobe. This likely represents edema within the re-expanded lung. Infection is considered less likely. 2. Stable left-sided chest tube without recurrent pneumothorax. 3. Emphysema. Electronically Signed   By: San Morelle M.D.   On: 01/09/2020 06:03   DG Chest Portable 1 View  Result Date: 01/08/2020 CLINICAL  DATA:  Chest tube placement EXAM: PORTABLE CHEST 1 VIEW COMPARISON:  Portable exam 1753 hours compared to 1429 hours FINDINGS: RIGHT jugular Port-A-Cath with tip projecting over confluence of SVC. New pigtail LEFT thoracostomy tube with decrease in LEFT pneumothorax. Normal heart size and mediastinal contours. Again identified bulla at LEFT apex with scattered atelectasis. Underlying severe emphysematous changes. No pleural effusion or acute osseous findings. IMPRESSION: Significant decrease in LEFT pneumothorax post thoracostomy tube placement. Again identified emphysematous changes with extensive bullous disease of the LEFT upper lobe. Electronically Signed   By: Lavonia Dana M.D.   On: 01/08/2020 18:29    Scheduled Meds: . aspirin EC  81 mg Oral Daily  . Chlorhexidine Gluconate Cloth  6 each Topical Daily  .  dexamethasone  8 mg Oral Daily  . DULoxetine  30 mg Oral Daily  . fentaNYL  1 patch Transdermal Q72H  . lidocaine-EPINEPHrine  10 mL Infiltration Once  . magnesium chloride  1 tablet Oral Daily  . potassium chloride  20 mEq Oral Daily  . senna  1 tablet Oral Daily  . sodium chloride flush  3 mL Intravenous Q12H  . umeclidinium bromide  1 puff Inhalation Daily   Continuous Infusions: . sodium chloride       LOS: 1 day   Time spent: 35 minutes.   Darliss Cheney, MD [facility]  01/09/2020, 1:03 PM   To contact the attending provider between 7A-7P or the covering provider during after hours 7P-7A, please log into the web site [email or web address].

## 2020-01-10 ENCOUNTER — Inpatient Hospital Stay

## 2020-01-10 DIAGNOSIS — J939 Pneumothorax, unspecified: Secondary | ICD-10-CM | POA: Diagnosis not present

## 2020-01-10 LAB — BASIC METABOLIC PANEL
Anion gap: 9 (ref 5–15)
BUN: 17 mg/dL (ref 8–23)
CO2: 26 mmol/L (ref 22–32)
Calcium: 8.2 mg/dL — ABNORMAL LOW (ref 8.9–10.3)
Chloride: 104 mmol/L (ref 98–111)
Creatinine, Ser: 0.47 mg/dL — ABNORMAL LOW (ref 0.61–1.24)
GFR, Estimated: >60 mL/min (ref >60)
Glucose, Bld: 134 mg/dL — ABNORMAL HIGH (ref 70–99)
Potassium: 4.1 mmol/L (ref 3.5–5.1)
Sodium: 139 mmol/L (ref 135–145)

## 2020-01-10 LAB — CBC
HCT: 35.3 % — ABNORMAL LOW (ref 39.0–52.0)
Hemoglobin: 11.6 g/dL — ABNORMAL LOW (ref 13.0–17.0)
MCH: 28.5 pg (ref 26.0–34.0)
MCHC: 32.9 g/dL (ref 30.0–36.0)
MCV: 86.7 fL (ref 80.0–100.0)
Platelets: 247 10*3/uL (ref 150–400)
RBC: 4.07 MIL/uL — ABNORMAL LOW (ref 4.22–5.81)
RDW: 21.9 % — ABNORMAL HIGH (ref 11.5–15.5)
WBC: 17.3 10*3/uL — ABNORMAL HIGH (ref 4.0–10.5)
nRBC: 0 % (ref 0.0–0.2)

## 2020-01-10 NOTE — Progress Notes (Signed)
Samuel Hood Follow Up Note  Patient ID: Samuel Mcburney., male   DOB: Jul 07, 1950, 69 y.o.   MRN: [id number]  HISTORY: Not short of breath.  No new complaints.  Very appropriate this morning.    Vitals:   01/10/20 0403 01/10/20 0902  BP: 112/84 103/86  Pulse: 82 95  Resp: 20 16  Temp: 98.9 F (37.2 C) 98.6 F (37 C)  SpO2: 92% 97%     EXAM:  Resp: Lungs are clear bilaterally but distant.  No respiratory distress, normal effort. Heart:  Regular without murmurs Abd:  Abdomen is soft, non distended and non tender. No masses are palpable.  There is no rebound and no guarding.  Neurological: Alert and oriented to person, place, and time. Coordination normal.  Skin: Skin is warm and dry. No rash noted. No diaphoretic. No erythema. No pallor.  Psychiatric: Normal mood and affect. Normal behavior. Judgment and thought content normal.   No air leak seen.  Independent review of his chest xray shows no pneumothorax  ASSESSMENT: Spontaneous pneumothorax   PLAN:   Waterseal chest tube today Repeat CXRay in the morning If no air leak and CXRay looks OK, remove tube tomorrow    Nestor Lewandowsky, MDPatient ID: Samuel Hood., male   DOB: 10-Sep-1950, 70 y.o.   MRN: [id number]

## 2020-01-10 NOTE — Progress Notes (Signed)
PROGRESS NOTE    Samuel Hood.  SJG:[id number] DOB: [birth year] DOA: 01/08/2020 PCP: Jerrol Banana., MD   Brief Narrative:   HPI: Samuel Hood. is a 69 y.o. male with medical history significant of lung cancer with mets to spine, perineum on Hospice for stg IV disease, Hepatocellular carcinoma, HTN was seen by hospice today for increased SOB. He was referred to [facility] for further evaluation.    ED Course: HR 102, 107/89  RR 17. CXR with large left pneumothorax. Cmet with glucose of 139, Albumin 2.5. Pig-tail catheter placed left chest with re-expansion left lung. Surgery consulted, Dr. Dahlia Byes, who will follow but requested medicine admit patient due to complex medical condition. TRH called to admit  Assessment & Plan:   Active Problems:   Essential hypertension   Pulmonary emphysema (HCC)   Chronic active hepatitis ([place])   Stage IV adenocarcinoma of lung, left ([place])   Pneumothorax, left  Spontaneous left pneumothorax: Chest tube in place, managed by CT surgery.  Continue on waterseal.  Hopefully removal tomorrow.  Managed by CT surgery.  COPD: Continue LABA.  Stage IV lung cancer with mets: Not on chemo currently.  He is a hospice patient.  Essential hypertension: Stable.  Continue home medications.  DVT prophylaxis:    Code Status: DNR  Family Communication: None present at bedside.  Discussed with his daughter over the phone while in his room.  Status is: Inpatient  Remains inpatient appropriate because:Inpatient level of care appropriate due to severity of illness   Dispo: The patient is from: Home              Anticipated d/c is to: Home              Anticipated d/c date is: 1 day              Patient currently is not medically stable to d/c.        Estimated body mass index is 21.64 kg/m as calculated from the following:   Height as of this encounter: 5\' 5"  (1.651 m).   Weight as of this encounter: 59 kg.      Nutritional status:                Consultants:   CT surgery  Procedures:   Chest tube placement  Antimicrobials:  Anti-infectives (From admission, onward)   None         Subjective: Seen and examined.  No complaints.  Objective: Vitals:   01/09/20 2300 01/10/20 0403 01/10/20 0902 01/10/20 1132  BP: 110/70 112/84 103/86 99/67  Pulse: (!) 53 82 95 98  Resp: 18 20 16 18   Temp: 98.1 F (36.7 C) 98.9 F (37.2 C) 98.6 F (37 C) 98.5 F (36.9 C)  TempSrc:      SpO2: 98% 92% 97% 99%  Weight:      Height:        Intake/Output Summary (Last 24 hours) at 01/10/2020 1220 Last data filed at 01/10/2020 1129 Gross per 24 hour  Intake --  Output 524 ml  Net -524 ml   Filed Weights   01/08/20 1615 01/08/20 2114  Weight: 52.2 kg 59 kg    Examination:  General exam: Appears calm and comfortable  Respiratory system: Clear to auscultation. Respiratory effort normal. Cardiovascular system: S1 & S2 heard, RRR. No JVD, murmurs, rubs, gallops or clicks. No pedal edema. Gastrointestinal system: Abdomen is nondistended, soft and nontender. No organomegaly or masses felt. Normal  bowel sounds heard. Central nervous system: Alert and oriented. No focal neurological deficits. Extremities: Symmetric 5 x 5 power. Skin: No rashes, lesions or ulcers.  Psychiatry: Judgement and insight appear normal. Mood & affect appropriate. .    Data Reviewed: I have personally reviewed following labs and imaging studies  CBC: Recent Labs  Lab 01/08/20 1832 01/09/20 0853 01/10/20 0659  WBC 14.3* 14.4* 17.3*  NEUTROABS  --  11.8*  --   HGB 12.0* 12.3* 11.6*  HCT 37.2* 38.2* 35.3*  MCV 87.7 87.4 86.7  PLT 271 270 628   Basic Metabolic Panel: Recent Labs  Lab 01/08/20 1619 01/09/20 0853 01/10/20 0659  NA 137 138 139  K 3.7 3.7 4.1  CL 102 102 104  CO2 24 24 26   GLUCOSE 139* 117* 134*  BUN 19 21 17   CREATININE 0.61 0.68 0.47*  CALCIUM 8.5* 8.3* 8.2*  MG  --  1.9  --    GFR: Estimated  Creatinine Clearance: 74.8 mL/min (A) (by C-G formula based on SCr of 0.47 mg/dL (L)). Liver Function Tests: Recent Labs  Lab 01/08/20 1619  AST 50*  ALT 66*  ALKPHOS 133*  BILITOT 0.8  PROT 6.5  ALBUMIN 2.5*   No results for input(s): LIPASE, AMYLASE in the last 168 hours. No results for input(s): AMMONIA in the last 168 hours. Coagulation Profile: Recent Labs  Lab 01/08/20 1832  INR 1.1   Cardiac Enzymes: No results for input(s): CKTOTAL, CKMB, CKMBINDEX, TROPONINI in the last 168 hours. BNP (last 3 results) No results for input(s): PROBNP in the last 8760 hours. HbA1C: No results for input(s): HGBA1C in the last 72 hours. CBG: No results for input(s): GLUCAP in the last 168 hours. Lipid Profile: No results for input(s): CHOL, HDL, LDLCALC, TRIG, CHOLHDL, LDLDIRECT in the last 72 hours. Thyroid Function Tests: No results for input(s): TSH, T4TOTAL, FREET4, T3FREE, THYROIDAB in the last 72 hours. Anemia Panel: No results for input(s): VITAMINB12, FOLATE, FERRITIN, TIBC, IRON, RETICCTPCT in the last 72 hours. Sepsis Labs: No results for input(s): PROCALCITON, LATICACIDVEN in the last 168 hours.  Recent Results (from the past 240 hour(s))  Resp Panel by RT-PCR (Flu A&B, Covid) Nasopharyngeal Swab     Status: None   Collection Time: 01/08/20  6:32 PM   Specimen: Nasopharyngeal Swab; Nasopharyngeal(NP) swabs in vial transport medium  Result Value Ref Range Status   SARS Coronavirus 2 by RT PCR NEGATIVE NEGATIVE Final    Comment: (NOTE) SARS-CoV-2 target nucleic acids are NOT DETECTED.  The SARS-CoV-2 RNA is generally detectable in upper respiratory specimens during the acute phase of infection. The lowest concentration of SARS-CoV-2 viral copies this assay can detect is 138 copies/mL. A negative result does not preclude SARS-Cov-2 infection and should not be used as the sole basis for treatment or other patient management decisions. A negative result may occur with   improper specimen collection/handling, submission of specimen other than nasopharyngeal swab, presence of viral mutation(s) within the areas targeted by this assay, and inadequate number of viral copies(<138 copies/mL). A negative result must be combined with clinical observations, patient history, and epidemiological information. The expected result is Negative.  Fact Sheet for Patients:  EntrepreneurPulse.com.au  Fact Sheet for Healthcare Providers:  IncredibleEmployment.be  This test is no t yet approved or cleared by the [place] FDA and  has been authorized for detection and/or diagnosis of SARS-CoV-2 by FDA under an Emergency Use Authorization (EUA). This EUA will remain  in effect (meaning this test  can be used) for the duration of the COVID-19 declaration under Section 564(b)(1) of the Act, 21 U.S.C.section 360bbb-3(b)(1), unless the authorization is terminated  or revoked sooner.       Influenza A by PCR NEGATIVE NEGATIVE Final   Influenza B by PCR NEGATIVE NEGATIVE Final    Comment: (NOTE) The Xpert Xpress SARS-CoV-2/FLU/RSV plus assay is intended as an aid in the diagnosis of influenza from Nasopharyngeal swab specimens and should not be used as a sole basis for treatment. Nasal washings and aspirates are unacceptable for Xpert Xpress SARS-CoV-2/FLU/RSV testing.  Fact Sheet for Patients: EntrepreneurPulse.com.au  Fact Sheet for Healthcare Providers: IncredibleEmployment.be  This test is not yet approved or cleared by the [place] FDA and has been authorized for detection and/or diagnosis of SARS-CoV-2 by FDA under an Emergency Use Authorization (EUA). This EUA will remain in effect (meaning this test can be used) for the duration of the COVID-19 declaration under Section 564(b)(1) of the Act, 21 U.S.C. section 360bbb-3(b)(1), unless the authorization is terminated  or revoked.  Performed at [facility], [address], [place], [place] [address]       Radiology Studies: DG Chest 2 View  Addendum Date: 01/08/2020   ADDENDUM REPORT: 01/08/2020 15:31 ADDENDUM: Critical Value/emergent results were called by me on 01/08/2020 at 3:31 pm to provider Rulon Abide, who verbally acknowledged these results. Electronically Signed   By: Misty Stanley M.D.   On: 01/08/2020 15:31   Result Date: 01/08/2020 CLINICAL DATA:  Shortness of breath. EXAM: CHEST - 2 VIEW COMPARISON:  03/24/2019 FINDINGS: Large basilar predominant left pneumothorax. Right lung clear. Mild shift of cardiomediastinal anatomy to the right. Right Port-A-Cath tip overlies the right innominate vein, likely above the innominate vein confluence. No worrisome lytic or sclerotic osseous abnormality. IMPRESSION: Large basilar predominant left pneumothorax with mild shift of cardiomediastinal anatomy to the right. Component of tension pneumothorax not excluded. Electronically Signed: By: Misty Stanley M.D. On: 01/08/2020 15:03   DG Chest Port 1 View  Result Date: 01/10/2020 CLINICAL DATA:  Tension pneumothorax.  Postop check. EXAM: PORTABLE CHEST 1 VIEW COMPARISON:  01/09/2020. FINDINGS: There is a right chest wall port a catheter with tip projecting over the mid SVC. Left-sided chest tube is again identified with pigtail overlying the left lower lung. Normal heart size. No signs of recurrent left-sided pneumothorax. Advanced changes of bullous emphysema. Left apical bulla is again noted in appears unchanged when compared with the previous exam. Airspace disease within the left lower lung is noted and appears mildly improved in the interval. Right lung remains clear IMPRESSION: 1. No signs of recurrent left-sided pneumothorax. 2. Slight improvement in aeration to the left lower lung. 3. Stable appearance of left chest tube. Electronically Signed   By: Kerby Moors M.D.   On: 01/10/2020 07:36    Portable chest 1 View  Result Date: 01/09/2020 CLINICAL DATA:  Pneumothorax.  Left lung cancer. EXAM: PORTABLE CHEST 1 VIEW COMPARISON:  One-view chest x-ray 01/08/2020 FINDINGS: Right IJ Port-A-Cath is stable. Left-sided chest tube remains in place. No recurrent pneumothorax is present. Increasing interstitial and airspace disease is noted in the left lower lobe. Bullous disease is again noted at the left apex. Emphysematous changes are present on the right without focal airspace disease. Heart size is normal. IMPRESSION: 1. Increasing interstitial and airspace disease in the left lower lobe. This likely represents edema within the re-expanded lung. Infection is considered less likely. 2. Stable left-sided chest tube without recurrent pneumothorax. 3.  Emphysema. Electronically Signed   By: San Morelle M.D.   On: 01/09/2020 06:03   DG Chest Portable 1 View  Result Date: 01/08/2020 CLINICAL DATA:  Chest tube placement EXAM: PORTABLE CHEST 1 VIEW COMPARISON:  Portable exam 1753 hours compared to 1429 hours FINDINGS: RIGHT jugular Port-A-Cath with tip projecting over confluence of SVC. New pigtail LEFT thoracostomy tube with decrease in LEFT pneumothorax. Normal heart size and mediastinal contours. Again identified bulla at LEFT apex with scattered atelectasis. Underlying severe emphysematous changes. No pleural effusion or acute osseous findings. IMPRESSION: Significant decrease in LEFT pneumothorax post thoracostomy tube placement. Again identified emphysematous changes with extensive bullous disease of the LEFT upper lobe. Electronically Signed   By: Lavonia Dana M.D.   On: 01/08/2020 18:29    Scheduled Meds: . aspirin EC  81 mg Oral Daily  . Chlorhexidine Gluconate Cloth  6 each Topical Daily  . dexamethasone  8 mg Oral Daily  . DULoxetine  30 mg Oral Daily  . fentaNYL  1 patch Transdermal Q72H  . lidocaine-EPINEPHrine  10 mL Infiltration Once  . magnesium chloride  1 tablet Oral Daily   . potassium chloride  20 mEq Oral Daily  . senna  1 tablet Oral Daily  . sodium chloride flush  3 mL Intravenous Q12H  . umeclidinium bromide  1 puff Inhalation Daily   Continuous Infusions: . sodium chloride       LOS: 2 days   Time spent: 27 minutes.   Darliss Cheney, MD [facility]  01/10/2020, 12:20 PM   To contact the attending provider between 7A-7P or the covering provider during after hours 7P-7A, please log into the web site [email or web address].

## 2020-01-10 NOTE — Progress Notes (Signed)
[facility] hospital Liaison note: Day 2 of hospital stay  Patient is currently followed by [facility] hospice services at home with a hospice diagnosis of Malignant neoplasm of unspecified part of left bronchus or lung. He is a DNR code with out of facility DNR in place in the home.  This is a related admission per hospice physician Dr. Jewel Baize.  Visit to bedside this afternoon to find Mr. Macinnes lying on his right side talking on the cell phone.  He is on room air and no shortness of breath or signs of dyspnea noted.  He got off the phone and we had a nice conversation.  Mr. Boettner did not complain of any trouble breathing.  His speech is clear and he voices wanting to get the tube out and go home.  Chest tube to wall suction with output of 40ml today.    Patient has continued to complain of mild to moderate pain requiring several doses of hisOxycodone 5-10 mg PO every 4 hours prn.   Patient has received 3 doses today- 0211- 10mg  6073- 10mg  1120-10mg   He is having good urine output and has not had a bowel movement today.  He states he's been trying to get some coffee, but he hasn't received any.  I asked his nurse Vanita Ingles if he was able to have coffee, she said yes decaf.  I brought Mr. Michelli a cup of coffee and he was appreciative.  He is not in the best spirits today because he wants to go home.  I sat and visited with him awhile.  Patient was seen today by hospitalist and cardiothoracic MD.  Continue with the plan of pulling tube tomorrow and discharging home.  There were no visitors at the bedside upon my visit.  I spoke with Jhonnie Garner RN TOC via telephone today.  Nothing has changed with discharge plans for tomorrow pending he remains stable.  Brief Narrative:   XTG:GYIR D Salado Jr.is a 69 y.o.malewith medical history significant oflung cancer with mets to spine, perineum on Hospice for stg IV disease, Hepatocellular carcinoma, HTN was seen by hospice  today for increased SOB. He was referred to [facility] for further evaluation.  ED Course:HR 102, 107/89 RR 17. CXR with large left pneumothorax. Cmet with glucose of 139, Albumin 2.5. Pig-tail catheter placed left chest with re-expansion left lung. Surgery consulted, Dr. Dahlia Byes, who will follow but requested medicine admit patient due to complex medical condition. TRH called to admit  Assessment & Plan:   Active Problems:   Essential hypertension   Pulmonary emphysema (HCC)   Chronic active hepatitis ([place])   Stage IV adenocarcinoma of lung, left ([place])   Pneumothorax, left  Spontaneous left pneumothorax: Chest tube in place, managed by CT surgery.  Continue on waterseal.  Hopefully removal tomorrow.  Managed by CT surgery.  COPD: Continue LABA.  Stage IV lung cancer with mets: Not on chemo currently.  He is a hospice patient.  Essential hypertension: Stable.  Continue home medications.  DVT prophylaxis:    Code Status: DNR  Family Communication: None present at bedside.  Discussed with his daughter over the phone while in his room.  Status is: Inpatient  Remains inpatient appropriate because:Inpatient level of care appropriate due to severity of illness   Dispo: The patient is from: Home  Anticipated d/c is to: Home  Anticipated d/c date is: 1 day  Patient currently is not medically stable to d/c.   Estimated body mass index is 21.64 kg/m  as calculated from the following:   Height as of this encounter: 5\' 5"  (1.651 m).   Weight as of this encounter: 59 kg.  Subjective: Seen and examined.  No complaints.  Objective:       Vitals:   01/09/20 2300 01/10/20 0403 01/10/20 0902 01/10/20 1132  BP: 110/70 112/84 103/86 99/67  Pulse: (!) 53 82 95 98  Resp: 18 20 16 18   Temp: 98.1 F (36.7 C) 98.9 F (37.2 C) 98.6 F (37 C) 98.5 F (36.9 C)  TempSrc:      SpO2: 98% 92% 97% 99%  Weight:      Height:         Intake/Output Summary (Last 24 hours) at 01/10/2020 1220 Last data filed at 01/10/2020 1129    Gross per 24 hour  Intake --  Output 524 ml  Net -524 ml       Filed Weights   01/08/20 1615 01/08/20 2114  Weight: 52.2 kg 59 kg    Examination:  General exam: Appears calm and comfortable  Respiratory system: Clear to auscultation. Respiratory effort normal. Cardiovascular system: S1 & S2 heard, RRR. No JVD, murmurs, rubs, gallops or clicks. No pedal edema. Gastrointestinal system: Abdomen is nondistended, soft and nontender. No organomegaly or masses felt. Normal bowel sounds heard. Central nervous system: Alert and oriented. No focal neurological deficits. Extremities: Symmetric 5 x 5 power. Skin: No rashes, lesions or ulcers.  Psychiatry: Judgement and insight appear normal. Mood & affect appropriate. Marland Kitchen   SARS negative Inflenza A- Negative Influenza B- Negative  Scheduled Meds: . aspirin EC  81 mg Oral Daily  . Chlorhexidine Gluconate Cloth  6 each Topical Daily  . dexamethasone  8 mg Oral Daily  . DULoxetine  30 mg Oral Daily  . fentaNYL  1 patch Transdermal Q72H  . lidocaine-EPINEPHrine  10 mL Infiltration Once  . magnesium chloride  1 tablet Oral Daily  . potassium chloride  20 mEq Oral Daily  . senna  1 tablet Oral Daily  . sodium chloride flush  3 mL Intravenous Q12H  . umeclidinium bromide  1 puff Inhalation Daily   PRN Infusions: . sodium chloride  prn   PRN Medications ondansetron (ZOFRAN) tablet 8 mg Dose: 8 mg Freq: 2 times daily PRN Route: PO PRN Reason: refractory nausea / vomiting Start: 01/08/20 1834           oxyCODONE (Oxy IR/ROXICODONE) immediate release tablet 5-10 mg Dose: 5-10 mg Freq: Every 4 hours PRN Route: PO PRN Reasons: severe pain,breakthrough pain Start: 01/08/20 1834  Admin Instructions:  IMMEDIATE RELEASE - OxyIR     0211   0655   1120           sodium chloride flush (NS) 0.9 % injection 3 mL Dose: 3  mL Freq: As needed Route: IV PRN Reason: line care Start: 01/08/20 1839           traZODone (DESYREL) tablet 25 mg Dose: 25 mg Freq: At bedtime PRN Route: PO PRN Reason: sleep Start: 01/08/20 1840             LABS  Ref Range & Units 06:59 1 d ago 2 d ago  Sodium 135 - 145 mmol/L 139  138  137   Potassium 3.5 - 5.1 mmol/L 4.1  3.7  3.7   Chloride 98 - 111 mmol/L 104  102  102   CO2 22 - 32 mmol/L 26  24  24    Glucose, Bld 70 -  99 mg/dL 134High  117High CM  139High CM   Comment: Glucose reference range applies only to samples taken after fasting for at least 8 hours.  BUN 8 - 23 mg/dL 17  21  19    Creatinine, Ser 0.61 - 1.24 mg/dL 0.47Low  0.68  0.61   Calcium 8.9 - 10.3 mg/dL 8.2Low  8.3Low  8.5Low   GFR, Estimated >60 mL/min >60  >60 CM  >60 CM   Comment: (NOTE)  Calculated using the CKD-EPI Creatinine Equation (2021)   Anion gap 5 - 15 9  12  CM  11 CM     Will continue to follow through final disposition.  Report given to patient's hospice care team.  Dimas Aguas, RN Director of [place] [phone]

## 2020-01-11 ENCOUNTER — Inpatient Hospital Stay

## 2020-01-11 DIAGNOSIS — J939 Pneumothorax, unspecified: Secondary | ICD-10-CM | POA: Diagnosis not present

## 2020-01-11 DIAGNOSIS — C3492 Malignant neoplasm of unspecified part of left bronchus or lung: Secondary | ICD-10-CM | POA: Diagnosis not present

## 2020-01-11 MED ORDER — HEPARIN SOD (PORK) LOCK FLUSH 100 UNIT/ML IV SOLN
500.0000 [IU] | Freq: Once | INTRAVENOUS | Status: AC
  Administered 2020-01-11: 15:00:00 500 [IU] via INTRAVENOUS
  Filled 2020-01-11: qty 5

## 2020-01-11 NOTE — Progress Notes (Signed)
01/11/2020  Subjective: No acute events.  Patient's chest tube was placed to waterseal yesterday.  Patient denies any shortness of breath, or chest pain.  Chest x-ray this morning is stable with no pneumothorax noted.  There is an area of lucency in the apical portion of the left chest but I think is related to the large bleb/bulla that the patient has.  Vital signs: Temp:  [98.3 F (36.8 C)-98.6 F (37 C)] 98.6 F (37 C) (11/25 2005) Pulse Rate:  [78-100] 100 (11/25 2005) Resp:  [18-20] 20 (11/25 2005) BP: (85-122)/(63-82) 85/63 (11/25 2005) SpO2:  [98 %-100 %] 100 % (11/25 2005)   Intake/Output: 11/25 0701 - 11/26 0700 In: -  Out: 264 [Urine:250; Chest Tube:14] Last BM Date: 01/10/20 (per patient )  Physical Exam: Constitutional: No acute distress Pulm: Left percutaneous chest tube in place with no air leak on Pleur-evac with coughing.  Labs:  Recent Labs    01/09/20 0853 01/10/20 0659  WBC 14.4* 17.3*  HGB 12.3* 11.6*  HCT 38.2* 35.3*  PLT 270 247   Recent Labs    01/09/20 0853 01/10/20 0659  NA 138 139  K 3.7 4.1  CL 102 104  CO2 24 26  GLUCOSE 117* 134*  BUN 21 17  CREATININE 0.68 0.47*  CALCIUM 8.3* 8.2*   Recent Labs    01/08/20 1832  LABPROT 13.5  INR 1.1    Imaging: DG Chest Port 1 View  Result Date: 01/11/2020 CLINICAL DATA:  Postoperative follow-up EXAM: PORTABLE CHEST 1 VIEW COMPARISON:  01/10/2020 FINDINGS: No changes since prior study. Normal heart size. Persistent left pneumothorax with left chest tube in place. Consolidation or atelectasis in the left lower lung. Probable left superior perihilar mass. Right central venous catheter remains unchanged in position. IMPRESSION: No change since prior study. Electronically Signed   By: Lucienne Capers M.D.   On: 01/11/2020 05:19    Assessment/Plan: This is a 69 y.o. male with spontaneous left pneumothorax status post left chest tube placement.  -Chest x-ray from this morning personally viewed  and is stable compared to previous chest x-rays with no pneumothorax and I think what is a large apical bulla.  Chest tube was removed at bedside without any complications. -Follow-up chest x-ray this afternoon has not been read yet but personally reviewing the images, the area stable with no pneumothorax and again the large bulla.   -From surgical standpoint, patient may be discharged home.  He will need follow-up with Dr. Genevive Bi in 2 weeks.   Melvyn Neth, [facility]

## 2020-01-11 NOTE — Progress Notes (Signed)
Received MD order to discharge patient to home, revived discharge instructions, homes meds and follow up appointments with patient and patient verbalized understanding.

## 2020-01-11 NOTE — Discharge Instructions (Signed)
Pneumothorax A pneumothorax is commonly called a collapsed lung. It is a condition in which air leaks from a lung and builds up between the thin layer of tissue that covers the lungs (visceral pleura) and the interior wall of the chest cavity (parietal pleura). The air gets trapped outside the lung, between the lung and the chest wall (pleural space). The air takes up space and prevents the lung from fully expanding. This condition sometimes occurs suddenly with no apparent cause. The buildup of air may be small or large. A small pneumothorax may go away on its own. A large pneumothorax will require treatment and hospitalization. What are the causes? This condition may be caused by:  Trauma and injury to the chest wall.  Surgery and other medical procedures.  A complication of an underlying lung problem, especially chronic obstructive pulmonary disease (COPD) or emphysema. Sometimes the cause of this condition is not known. What increases the risk? You are more likely to develop this condition if:  You have an underlying lung problem.  You smoke.  You are 50-74 years old, male, tall, and underweight.  You have a personal or family history of pneumothorax.  You have an eating disorder (anorexia nervosa). This condition can also happen quickly, even in people with no history of lung problems. What are the signs or symptoms? Sometimes a pneumothorax will have no symptoms. When symptoms are present, they can include:  Chest pain.  Shortness of breath.  Increased rate of breathing.  Bluish color to your lips or skin (cyanosis). How is this diagnosed? This condition may be diagnosed by:  A medical history and physical exam.  A chest X-ray, chest CT scan, or ultrasound. How is this treated? Treatment depends on how severe your condition is. The goal of treatment is to remove the extra air and allow your lung to expand back to its normal size.  For a small pneumothorax: ? No  treatment may be needed. ? Extra oxygen is sometimes used to make it go away more quickly.  For a large pneumothorax or a pneumothorax that is causing symptoms, a procedure is done to drain the air from your lungs. To do this, a health care provider may use: ? A needle with a syringe. This is used to suck air from a pleural space where no additional leakage is taking place. ? A chest tube. This is used to suck air where there is ongoing leakage into the pleural space. The chest tube may need to remain in place for several days until the air leak has healed.  In more severe cases, surgery may be needed to repair the damage that is causing the leak.  If you have multiple pneumothorax episodes or have an air leak that will not heal, a procedure called a pleurodesis may be done. A medicine is placed in the pleural space to irritate the tissues around the lung so that the lung will stick to the chest wall, seal any leaks, and stop any buildup of air in that space. If you have an underlying lung problem, severe symptoms, or a large pneumothorax you will usually need to stay in the hospital. Follow these instructions at home: Lifestyle  Do not use any products that contain nicotine or tobacco, such as cigarettes and e-cigarettes. These are major risk factors in pneumothorax. If you need help quitting, ask your health care provider.  Do not lift anything that is heavier than 10 lb (4.5 kg), or the limit that your health care  provider tells you, until he or she says that it is safe.  Avoid activities that take a lot of effort (strenuous) for as long as told by your health care provider.  Return to your normal activities as told by your health care provider. Ask your health care provider what activities are safe for you.  Do not fly in an airplane or scuba dive until your health care provider says it is okay. General instructions  Take over-the-counter and prescription medicines only as told by your  health care provider.  If a cough or pain makes it difficult for you to sleep at night, try sleeping in a semi-upright position in a recliner or by using 2 or 3 pillows.  If you had a chest tube and it was removed, ask your health care provider when you can remove the bandage (dressing). While the dressing is in place, do not allow it to get wet.  Keep all follow-up visits as told by your health care provider. This is important. Contact a health care provider if:  You cough up thick mucus (sputum) that is yellow or green in color.  You were treated with a chest tube, and you have redness, increasing pain, or discharge at the site where it was placed. Get help right away if:  You have increasing chest pain or shortness of breath.  You have a cough that will not go away.  You begin coughing up blood.  You have pain that is getting worse or is not controlled with medicines.  The site where your chest tube was located opens up.  You feel air coming out of the site where the chest tube was placed.  You have a fever or persistent symptoms for more than 2-3 days.  You have a fever and your symptoms suddenly get worse. These symptoms may represent a serious problem that is an emergency. Do not wait to see if the symptoms will go away. Get medical help right away. Call your local emergency services (911 in the [place] Do not drive yourself to the hospital. Summary  A pneumothorax, commonly called a collapsed lung, is a condition in which air leaks from a lung and gets trapped between the lung and the chest wall (pleural space).  The buildup of air may be small or large. A small pneumothorax may go away on its own. A large pneumothorax will require treatment and hospitalization.  Treatment for this condition depends on how severe the pneumothorax is. The goal of treatment is to remove the extra air and allow the lung to expand back to its normal size. This information is not intended to  replace advice given to you by your health care provider. Make sure you discuss any questions you have with your health care provider. Document Revised: 01/14/2017 Document Reviewed: 01/10/2017 Elsevier Patient Education  2020 [employer].

## 2020-01-11 NOTE — TOC Transition Note (Signed)
Transition of Care [facility]) - CM/SW Discharge Note   Patient Details  Name: Samuel Hood. MRN: [id number] Date of Birth: 1950-11-01  Transition of Care [facility]) CM/SW Contact:  Magnus Ivan, LCSW Phone Number: 01/11/2020, 3:14 PM   Clinical Narrative:   Patient has orders to discharge home today. Patient already active with [place] at home prior to this admission. Updated Santiago Glad and Kieth Brightly with [facility] on discharge. Patient's daughter already aware of plan for discharge.    Final next level of care: Home w Hospice Care Barriers to Discharge: Barriers Resolved   Patient Goals and CMS Choice Patient states their goals for this hospitalization and ongoing recovery are:: home with hospice CMS Medicare.gov Compare Post Acute Care list provided to:: Patient Represenative (must comment) Choice offered to / list presented to : Adult Children  Discharge Placement                Patient to be transferred to facility by: family Name of family member notified: daughter aware already Patient and family notified of of transfer: 01/11/20  Discharge Plan and Services   Discharge Planning Services: CM Consult                                 Social Determinants of Health ([place]) Interventions     Readmission Risk Interventions Readmission Risk Prevention Plan 01/09/2020  Transportation Screening Complete  HRI or [place] (No Data)  Palliative Care Screening Complete  Medication Review (RN Care Manager) Complete  Some recent data might be hidden

## 2020-01-11 NOTE — Progress Notes (Addendum)
[facility] Liaison note:  Visited patient at bedside, patient very anxious to return home. Chest tube was removed early this morning and repeat chest xray was performed. Patient remains on room air and denied pain. Writer spoke with staff RN Gerald Stabs, plan is for a repeat chest xray this afternoon and if there is no change, then plan is for discharge home via car. Patient's daughter to provide transportation. Hospice team to be alerted when discharge is finalized. Flo Shanks BSN, RN, [place] [phone]  UPDATE: Repeat chest xray performed and resulted. Per chart note review patient now has a discharge order in place.  Hospice team has been updated to planned discharge.  Flo Shanks BSN, RN, [facility]

## 2020-01-11 NOTE — Discharge Summary (Signed)
Physician Discharge Summary  Samuel Hood. ZOX:[id number] DOB: 12-20-1950 DOA: 01/08/2020  PCP: Jerrol Banana., MD  Admit date: 01/08/2020 Discharge date: 01/11/2020  Admitted From: Home Disposition: Home  Recommendations for Outpatient Follow-up:  1. Follow up with PCP in 1-2 weeks 2. Follow-up with cardiothoracic surgeon Dr. Faith Rogue in 2 weeks 3. Please obtain BMP/CBC in one week 4. Please follow up with your PCP on the following pending results: Unresulted Labs (From admission, onward)         None       Home Health: None Equipment/Devices: None  Discharge Condition: Stable CODE STATUS: DNR Diet recommendation: Cardiac  Subjective: Seen and examined.  No complaints.  Wants to go home.  Samuel Hoodis a 69 y.o.malewith medical history significant oflung cancer with mets to spine, perineum on Hospice for stg IV disease, Hepatocellular carcinoma, HTN was seen by hospice today for increased SOB. He was referred to [facility] for further evaluation.  ED Course:HR 102, 107/89 RR 17. CXR with large left pneumothorax. Cmet with glucose of 139, Albumin 2.5. Pig-tail catheter placed left chest with re-expansion left lung. Surgery consulted, Dr. Dahlia Byes, who will follow but requested medicine admit patient due to complex medical condition. [facility] called to admit  Brief/Interim Summary: Patient was admitted for left-sided pneumothorax.  CT surgery was consulted.  Left-sided chest tube was placed.  Subsequent x-rays were done.  His pneumothorax has resolved.  Chest tube was taken out this morning.  Repeat chest x-ray according to surgeon Dr. Olean Ree does not show pneumothorax and that patient can be discharged home.  I informed patient's daughter about this as well.  He needs to follow-up with CT surgery in 2 weeks.  Please note, patient is already a hospice patient.  Discharge Diagnoses:  Active Problems:   Essential hypertension   Pulmonary emphysema (HCC)    Chronic active hepatitis ([place])   Stage IV adenocarcinoma of lung, left ([place])   Pneumothorax, left    Discharge Instructions  Discharge Instructions    Discharge patient   Complete by: As directed    Discharge disposition: 01-Home or Self Care   Discharge patient date: 01/11/2020     Allergies as of 01/11/2020      Reactions   5-alpha Reductase Inhibitors       Medication List    STOP taking these medications   gabapentin 300 MG capsule Commonly known as: NEURONTIN   hydrochlorothiazide 25 MG tablet Commonly known as: HYDRODIURIL   loperamide 2 MG capsule Commonly known as: IMODIUM   megestrol 40 MG/ML suspension Commonly known as: MEGACE   Narcan 4 MG/0.1ML Liqd nasal spray kit Generic drug: naloxone   ondansetron 8 MG tablet Commonly known as: Zofran   polyethylene glycol 17 g packet Commonly known as: MIRALAX / GLYCOLAX     TAKE these medications   aspirin EC 81 MG tablet Take 1 tablet (81 mg total) by mouth daily. Swallow whole.   dexamethasone 4 MG tablet Commonly known as: DECADRON Take 2 tablets (8 mg total) by mouth daily.   DULoxetine 30 MG capsule Commonly known as: CYMBALTA Take 1 capsule (30 mg total) by mouth daily.   fentaNYL 25 MCG/HR Commonly known as: [place] 1 patch onto the skin every 3 (three) days.   lidocaine-prilocaine cream Commonly known as: EMLA Apply to affected area once   magnesium chloride 64 MG Tbec SR tablet Commonly known as: SLOW-MAG Take 1 tablet (64 mg total) by mouth daily.  multivitamin capsule Take 1 capsule by mouth daily.   oxyCODONE 5 MG immediate release tablet Commonly known as: Roxicodone Take 1-2 tablets (5-10 mg total) by mouth every 4 (four) hours as needed for severe pain or breakthrough pain.   potassium chloride 10 MEQ tablet Commonly known as: KLOR-CON Take 2 tablets (20 mEq total) by mouth daily.   prochlorperazine 10 MG tablet Commonly known as: COMPAZINE Take 1 tablet (10  mg total) by mouth every 6 (six) hours as needed (Nausea or vomiting).   senna 8.6 MG Tabs tablet Commonly known as: SENOKOT Take 1 tablet (8.6 mg total) by mouth daily.       Follow-up Information    Hulda Marin, MD Follow up in 2 week(s).   Specialties: Cardiothoracic Surgery, General Surgery Why: follow up in 2 weeks with repeat chest x-ray. Contact information: [address] [place] [address] [phone]        Maple Hudson., MD Follow up in 1 week(s).   Specialty: Family Medicine Contact information: [address] [place] [address] [place] [address] [phone]        Debbe Odea, MD .   Specialties: Cardiology, Radiology Contact information: [address] [place] [address] [phone]              Allergies  Allergen Reactions  . 5-Alpha Reductase Inhibitors     Consultations: CT surgery   Procedures/Studies: DG Chest 2 View  Addendum Date: 01/08/2020   ADDENDUM REPORT: 01/08/2020 15:31 ADDENDUM: Critical Value/emergent results were called by me on 01/08/2020 at 3:31 pm to provider Mignon Pine, who verbally acknowledged these results. Electronically Signed   By: Kennith Center M.D.   On: 01/08/2020 15:31   Result Date: 01/08/2020 CLINICAL DATA:  Shortness of breath. EXAM: CHEST - 2 VIEW COMPARISON:  03/24/2019 FINDINGS: Large basilar predominant left pneumothorax. Right lung clear. Mild shift of cardiomediastinal anatomy to the right. Right Port-A-Cath tip overlies the right innominate vein, likely above the innominate vein confluence. No worrisome lytic or sclerotic osseous abnormality. IMPRESSION: Large basilar predominant left pneumothorax with mild shift of cardiomediastinal anatomy to the right. Component of tension pneumothorax not excluded. Electronically Signed: By: Kennith Center M.D. On: 01/08/2020 15:03   DG Chest Port 1 View  Result Date: 01/11/2020 CLINICAL DATA:  Postoperative follow-up  EXAM: PORTABLE CHEST 1 VIEW COMPARISON:  01/10/2020 FINDINGS: No changes since prior study. Normal heart size. Persistent left pneumothorax with left chest tube in place. Consolidation or atelectasis in the left lower lung. Probable left superior perihilar mass. Right central venous catheter remains unchanged in position. IMPRESSION: No change since prior study. Electronically Signed   By: Burman Nieves M.D.   On: 01/11/2020 05:19   DG Chest Port 1 View  Result Date: 01/10/2020 CLINICAL DATA:  Tension pneumothorax.  Postop check. EXAM: PORTABLE CHEST 1 VIEW COMPARISON:  01/09/2020. FINDINGS: There is a right chest wall port a catheter with tip projecting over the mid SVC. Left-sided chest tube is again identified with pigtail overlying the left lower lung. Normal heart size. No signs of recurrent left-sided pneumothorax. Advanced changes of bullous emphysema. Left apical bulla is again noted in appears unchanged when compared with the previous exam. Airspace disease within the left lower lung is noted and appears mildly improved in the interval. Right lung remains clear IMPRESSION: 1. No signs of recurrent left-sided pneumothorax. 2. Slight improvement in aeration to the left lower lung. 3. Stable appearance of left chest tube. Electronically  Signed   By: Kerby Moors M.D.   On: 01/10/2020 07:36   Portable chest 1 View  Result Date: 01/09/2020 CLINICAL DATA:  Pneumothorax.  Left lung cancer. EXAM: PORTABLE CHEST 1 VIEW COMPARISON:  One-view chest x-ray 01/08/2020 FINDINGS: Right IJ Port-A-Cath is stable. Left-sided chest tube remains in place. No recurrent pneumothorax is present. Increasing interstitial and airspace disease is noted in the left lower lobe. Bullous disease is again noted at the left apex. Emphysematous changes are present on the right without focal airspace disease. Heart size is normal. IMPRESSION: 1. Increasing interstitial and airspace disease in the left lower lobe. This likely  represents edema within the re-expanded lung. Infection is considered less likely. 2. Stable left-sided chest tube without recurrent pneumothorax. 3. Emphysema. Electronically Signed   By: San Morelle M.D.   On: 01/09/2020 06:03   DG Chest Portable 1 View  Result Date: 01/08/2020 CLINICAL DATA:  Chest tube placement EXAM: PORTABLE CHEST 1 VIEW COMPARISON:  Portable exam 1753 hours compared to 1429 hours FINDINGS: RIGHT jugular Port-A-Cath with tip projecting over confluence of SVC. New pigtail LEFT thoracostomy tube with decrease in LEFT pneumothorax. Normal heart size and mediastinal contours. Again identified bulla at LEFT apex with scattered atelectasis. Underlying severe emphysematous changes. No pleural effusion or acute osseous findings. IMPRESSION: Significant decrease in LEFT pneumothorax post thoracostomy tube placement. Again identified emphysematous changes with extensive bullous disease of the LEFT upper lobe. Electronically Signed   By: Lavonia Dana M.D.   On: 01/08/2020 18:29      Discharge Exam: Vitals:   01/10/20 1500 01/10/20 2005  BP: 122/82 (!) 85/63  Pulse: 78 100  Resp: 18 20  Temp: 98.3 F (36.8 C) 98.6 F (37 C)  SpO2: 98% 100%   Vitals:   01/10/20 0902 01/10/20 1132 01/10/20 1500 01/10/20 2005  BP: 103/86 99/67 122/82 (!) 85/63  Pulse: 95 98 78 100  Resp: $Remo'16 18 18 20  'HOmiU$ Temp: 98.6 F (37 C) 98.5 F (36.9 C) 98.3 F (36.8 C) 98.6 F (37 C)  TempSrc:   Oral Oral  SpO2: 97% 99% 98% 100%  Weight:      Height:        General: Pt is alert, awake, not in acute distress Cardiovascular: RRR, S1/S2 +, no rubs, no gallops Respiratory: CTA bilaterally, no wheezing, no rhonchi Abdominal: Soft, NT, ND, bowel sounds + Extremities: no edema, no cyanosis    The results of significant diagnostics from this hospitalization (including imaging, microbiology, ancillary and laboratory) are listed below for reference.     Microbiology: Recent Results (from the  past 240 hour(s))  Resp Panel by RT-PCR (Flu A&B, Covid) Nasopharyngeal Swab     Status: None   Collection Time: 01/08/20  6:32 PM   Specimen: Nasopharyngeal Swab; Nasopharyngeal(NP) swabs in vial transport medium  Result Value Ref Range Status   SARS Coronavirus 2 by RT PCR NEGATIVE NEGATIVE Final    Comment: (NOTE) SARS-CoV-2 target nucleic acids are NOT DETECTED.  The SARS-CoV-2 RNA is generally detectable in upper respiratory specimens during the acute phase of infection. The lowest concentration of SARS-CoV-2 viral copies this assay can detect is 138 copies/mL. A negative result does not preclude SARS-Cov-2 infection and should not be used as the sole basis for treatment or other patient management decisions. A negative result may occur with  improper specimen collection/handling, submission of specimen other than nasopharyngeal swab, presence of viral mutation(s) within the areas targeted by this assay, and inadequate number  of viral copies(<138 copies/mL). A negative result must be combined with clinical observations, patient history, and epidemiological information. The expected result is Negative.  Fact Sheet for Patients:  EntrepreneurPulse.com.au  Fact Sheet for Healthcare Providers:  IncredibleEmployment.be  This test is no t yet approved or cleared by the [place] FDA and  has been authorized for detection and/or diagnosis of SARS-CoV-2 by FDA under an Emergency Use Authorization (EUA). This EUA will remain  in effect (meaning this test can be used) for the duration of the COVID-19 declaration under Section 564(b)(1) of the Act, 21 U.S.C.section 360bbb-3(b)(1), unless the authorization is terminated  or revoked sooner.       Influenza A by PCR NEGATIVE NEGATIVE Final   Influenza B by PCR NEGATIVE NEGATIVE Final    Comment: (NOTE) The Xpert Xpress SARS-CoV-2/FLU/RSV plus assay is intended as an aid in the diagnosis of  influenza from Nasopharyngeal swab specimens and should not be used as a sole basis for treatment. Nasal washings and aspirates are unacceptable for Xpert Xpress SARS-CoV-2/FLU/RSV testing.  Fact Sheet for Patients: EntrepreneurPulse.com.au  Fact Sheet for Healthcare Providers: IncredibleEmployment.be  This test is not yet approved or cleared by the [place] FDA and has been authorized for detection and/or diagnosis of SARS-CoV-2 by FDA under an Emergency Use Authorization (EUA). This EUA will remain in effect (meaning this test can be used) for the duration of the COVID-19 declaration under Section 564(b)(1) of the Act, 21 U.S.C. section 360bbb-3(b)(1), unless the authorization is terminated or revoked.  Performed at [facility], [address] [place], [place] [address]      Labs: BNP (last 3 results) No results for input(s): BNP in the last 8760 hours. Basic Metabolic Panel: Recent Labs  Lab 01/08/20 1619 01/09/20 0853 01/10/20 0659  NA 137 138 139  K 3.7 3.7 4.1  CL 102 102 104  CO2 $Re'24 24 26  'Xte$ GLUCOSE 139* 117* 134*  BUN $Re'19 21 17  'UmW$ CREATININE 0.61 0.68 0.47*  CALCIUM 8.5* 8.3* 8.2*  MG  --  1.9  --    Liver Function Tests: Recent Labs  Lab 01/08/20 1619  AST 50*  ALT 66*  ALKPHOS 133*  BILITOT 0.8  PROT 6.5  ALBUMIN 2.5*   No results for input(s): LIPASE, AMYLASE in the last 168 hours. No results for input(s): AMMONIA in the last 168 hours. CBC: Recent Labs  Lab 01/08/20 1832 01/09/20 0853 01/10/20 0659  WBC 14.3* 14.4* 17.3*  NEUTROABS  --  11.8*  --   HGB 12.0* 12.3* 11.6*  HCT 37.2* 38.2* 35.3*  MCV 87.7 87.4 86.7  PLT 271 270 247   Cardiac Enzymes: No results for input(s): CKTOTAL, CKMB, CKMBINDEX, TROPONINI in the last 168 hours. BNP: Invalid input(s): POCBNP CBG: No results for input(s): GLUCAP in the last 168 hours. D-Dimer No results for input(s): DDIMER in the last 72 hours. Hgb  A1c No results for input(s): HGBA1C in the last 72 hours. Lipid Profile No results for input(s): CHOL, HDL, LDLCALC, TRIG, CHOLHDL, LDLDIRECT in the last 72 hours. Thyroid function studies No results for input(s): TSH, T4TOTAL, T3FREE, THYROIDAB in the last 72 hours.  Invalid input(s): FREET3 Anemia work up No results for input(s): VITAMINB12, FOLATE, FERRITIN, TIBC, IRON, RETICCTPCT in the last 72 hours. Urinalysis    Component Value Date/Time   COLORURINE AMBER (A) 07/24/2019 1406   APPEARANCEUR CLOUDY (A) 07/24/2019 1406   LABSPEC 1.019 07/24/2019 1406   PHURINE 5.0 07/24/2019 1406   GLUCOSEU  NEGATIVE 07/24/2019 1406   HGBUR NEGATIVE 07/24/2019 1406   [place] 07/24/2019 1406   [place] 07/24/2019 1406   PROTEINUR NEGATIVE 07/24/2019 1406   NITRITE NEGATIVE 07/24/2019 1406   LEUKOCYTESUR NEGATIVE 07/24/2019 1406   Sepsis Labs Invalid input(s): PROCALCITONIN,  WBC,  LACTICIDVEN Microbiology Recent Results (from the past 240 hour(s))  Resp Panel by RT-PCR (Flu A&B, Covid) Nasopharyngeal Swab     Status: None   Collection Time: 01/08/20  6:32 PM   Specimen: Nasopharyngeal Swab; Nasopharyngeal(NP) swabs in vial transport medium  Result Value Ref Range Status   SARS Coronavirus 2 by RT PCR NEGATIVE NEGATIVE Final    Comment: (NOTE) SARS-CoV-2 target nucleic acids are NOT DETECTED.  The SARS-CoV-2 RNA is generally detectable in upper respiratory specimens during the acute phase of infection. The lowest concentration of SARS-CoV-2 viral copies this assay can detect is 138 copies/mL. A negative result does not preclude SARS-Cov-2 infection and should not be used as the sole basis for treatment or other patient management decisions. A negative result may occur with  improper specimen collection/handling, submission of specimen other than nasopharyngeal swab, presence of viral mutation(s) within the areas targeted by this assay, and inadequate number of  viral copies(<138 copies/mL). A negative result must be combined with clinical observations, patient history, and epidemiological information. The expected result is Negative.  Fact Sheet for Patients:  EntrepreneurPulse.com.au  Fact Sheet for Healthcare Providers:  IncredibleEmployment.be  This test is no t yet approved or cleared by the [place] FDA and  has been authorized for detection and/or diagnosis of SARS-CoV-2 by FDA under an Emergency Use Authorization (EUA). This EUA will remain  in effect (meaning this test can be used) for the duration of the COVID-19 declaration under Section 564(b)(1) of the Act, 21 U.S.C.section 360bbb-3(b)(1), unless the authorization is terminated  or revoked sooner.       Influenza A by PCR NEGATIVE NEGATIVE Final   Influenza B by PCR NEGATIVE NEGATIVE Final    Comment: (NOTE) The Xpert Xpress SARS-CoV-2/FLU/RSV plus assay is intended as an aid in the diagnosis of influenza from Nasopharyngeal swab specimens and should not be used as a sole basis for treatment. Nasal washings and aspirates are unacceptable for Xpert Xpress SARS-CoV-2/FLU/RSV testing.  Fact Sheet for Patients: EntrepreneurPulse.com.au  Fact Sheet for Healthcare Providers: IncredibleEmployment.be  This test is not yet approved or cleared by the [place] FDA and has been authorized for detection and/or diagnosis of SARS-CoV-2 by FDA under an Emergency Use Authorization (EUA). This EUA will remain in effect (meaning this test can be used) for the duration of the COVID-19 declaration under Section 564(b)(1) of the Act, 21 U.S.C. section 360bbb-3(b)(1), unless the authorization is terminated or revoked.  Performed at [facility], [address], [place], [place] [address]      Time coordinating discharge: Over 30 minutes  SIGNED:   Darliss Cheney, MD  [facility] 01/11/2020, 1:59 PM  If 7PM-7AM, please contact night-coverage [email or web address]

## 2020-01-11 NOTE — Care Management Important Message (Signed)
Important Message  Patient Details  Name: Samuel Hood. MRN: [id number] Date of Birth: Jun 06, 1950   Medicare Important Message Given:  Yes     Dannette Barbara 01/11/2020, 12:00 PM

## 2020-01-14 ENCOUNTER — Other Ambulatory Visit: Payer: Self-pay

## 2020-01-14 ENCOUNTER — Telehealth: Payer: Self-pay

## 2020-01-14 DIAGNOSIS — J939 Pneumothorax, unspecified: Secondary | ICD-10-CM

## 2020-01-14 NOTE — Telephone Encounter (Signed)
Tried reaching patient, unable to leave message due to mailbox not set up. Patient needs to be scheduled 01/25/20 to see Dr.Oaks- Pneumothorax.

## 2020-01-14 NOTE — Telephone Encounter (Signed)
Spoke with patient's daughter, Georgena Spurling, and patient will be seen 01/25/20 with a chest xray prior.

## 2020-01-14 NOTE — Telephone Encounter (Signed)
  Patient was recently discharged from the hospital on 01/11/20.  No TCM completed, patient does not qualify for TCM services due to being under the care of hospice.  No HFU apt is scheduled. Next apt is 03/04/20 for a 5 month f/u with Sharyn Lull Flinchum.

## 2020-01-14 NOTE — Telephone Encounter (Signed)
-----   Message from Olean Ree, MD sent at 01/11/2020  1:58 PM EST ----- Regarding: follow up Dr. Modesto Charon,  This patient was admitted to medical team with left pneumothorax and had chest tube placed.  He is being discharged today, and Dr. Genevive Bi mentioned he should follow up with him in two weeks, I'm assuming with repeat chest x-ray.  Can y'all schedule the appointment please?  Thanks!  [employer]

## 2020-01-14 NOTE — Telephone Encounter (Signed)
Needs follow up with oncology.

## 2020-01-17 NOTE — Telephone Encounter (Signed)
Unable to reach patient at this time, voicemail box has not been set up yet. KW

## 2020-01-25 ENCOUNTER — Other Ambulatory Visit: Payer: Self-pay | Admitting: Oncology

## 2020-01-25 ENCOUNTER — Other Ambulatory Visit: Payer: Self-pay

## 2020-01-25 ENCOUNTER — Encounter: Payer: Self-pay | Admitting: Physician Assistant

## 2020-01-25 ENCOUNTER — Ambulatory Visit
Admission: RE | Admit: 2020-01-25 | Discharge: 2020-01-25 | Disposition: A | Discharge status: Home / Self Care | Payer: Medicare Other | Attending: Cardiothoracic Surgery | Admitting: Cardiothoracic Surgery

## 2020-01-25 ENCOUNTER — Ambulatory Visit (INDEPENDENT_AMBULATORY_CARE_PROVIDER_SITE_OTHER): Payer: Medicare Other | Admitting: Physician Assistant

## 2020-01-25 ENCOUNTER — Ambulatory Visit
Admission: RE | Admit: 2020-01-25 | Discharge: 2020-01-25 | Disposition: A | Discharge status: Home / Self Care | Payer: Medicare Other | Source: Ambulatory Visit | Attending: Cardiothoracic Surgery | Admitting: Cardiothoracic Surgery

## 2020-01-25 VITALS — BP 110/79 | HR 99 | Temp 98.0°F | Ht 65.0 in | Wt 121.6 lb

## 2020-01-25 DIAGNOSIS — J939 Pneumothorax, unspecified: Secondary | ICD-10-CM | POA: Diagnosis not present

## 2020-01-25 NOTE — Progress Notes (Signed)
[facility] SURGICAL CLINIC NOTE  01/25/2020  History of Present Illness: Samuel Hood. is a 69 y.o. 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 He had chest tube placed by the EDP on 11/23 and was admitted o the medicine service. His PTX resolved and chest tube was removed on day of discharge (11/26). He presents to the clinic today for follow up.   He is doing well. His daughter is at bedside who helps with history. His breathing is baseline and he denied any issues with fever, chills, cough, SOB, or CP. He has left the dressing in place from chest tube removal. His only concern is significant pitting edema in his feet which is interfering with his ability to ambulate. His daughter reports that his hospice team is coming out to see him today. He did have a CXR this morning which I have reviewed and is without evidence of pneumothorax. No further issues from thoracic surgery standpoint.   Past Medical History: Past Medical History:  Diagnosis Date  . Hepatocellular carcinoma metastatic to left lung ([place]) 07/24/2018  . History of angiography    left lower extremity  . Hypertension   . Small bowel obstruction [facility])      Past Surgical History: Past Surgical History:  Procedure Laterality Date  . COLON SURGERY    . COLONOSCOPY W/ POLYPECTOMY    . COLONOSCOPY WITH PROPOFOL N/A 01/31/2018   Procedure: COLONOSCOPY WITH PROPOFOL;  Surgeon: Toledo, Benay Pike, MD;  Location: [facility] ENDOSCOPY;  Service: Gastroenterology;  Laterality: N/A;  . debridement fasciotomy leg, left Left 03/19/2016  . LAPAROTOMY N/A 09/27/2014   Procedure: EXPLORATORY LAPAROTOMY;  Surgeon: Dia Crawford III, MD;  Location: [facility] ORS;  Service: General;  Laterality: N/A;  . PORTA CATH INSERTION N/A 04/10/2019   Procedure: PORTA CATH INSERTION;  Surgeon: Katha Cabal, MD;  Location: [place] [facility];  Service: Cardiovascular;  Laterality: N/A;    Home Medications: Prior to Admission medications   Medication Sig Start Date End Date Taking? Authorizing Provider  aspirin EC 81 MG tablet Take 1 tablet (81 mg total) by mouth daily. Swallow whole. Patient not taking: Reported on 10/30/2019 10/26/19   Earlie Server, MD  dexamethasone (DECADRON) 4 MG tablet Take 2 tablets (8 mg total) by mouth daily. 12/24/19   Borders, Kirt Boys, NP  DULoxetine (CYMBALTA) 30 MG capsule Take 1 capsule (30 mg total) by mouth daily. 11/09/19   Earlie Server, MD  fentaNYL (DURAGESIC) 25 MCG/HR Place 1 patch onto the skin every 3 (three) days. 12/24/19   Borders, Kirt Boys, NP  lidocaine-prilocaine (EMLA) cream Apply to affected area once 04/06/19   Earlie Server, MD  magnesium chloride (SLOW-MAG) 64 MG TBEC SR tablet Take 1 tablet (64 mg total) by mouth daily. 10/26/19   Earlie Server, MD  Multiple Vitamin (MULTIVITAMIN) capsule Take 1 capsule by mouth daily.    [provider]  oxyCODONE (ROXICODONE) 5 MG immediate release tablet Take 1-2 tablets (5-10 mg total) by mouth every 4 (four) hours as needed for severe pain or breakthrough pain. 01/04/20   Borders, Kirt Boys, NP  potassium chloride (KLOR-CON) 10 MEQ tablet Take 2 tablets (20 mEq total) by mouth daily. 11/02/19   Earlie Server, MD  prochlorperazine (COMPAZINE) 10 MG tablet Take 1 tablet (10 mg total) by mouth every 6 (six)  hours as needed (Nausea or vomiting). Patient not taking: Reported on 10/26/2019 04/06/19   Earlie Server, MD  senna (SENOKOT) 8.6 MG TABS tablet Take 1 tablet (8.6 mg total) by mouth daily. Patient not taking: Reported on 10/26/2019 10/12/18   Jacquelin Hawking, NP    Allergies: Allergies  Allergen Reactions  . 5-Alpha Reductase Inhibitors     Review of Systems: Review of Systems  Constitutional: Negative for chills and fever.  Respiratory: Negative for cough and shortness of breath.   Cardiovascular: Negative for  chest pain and palpitations.  Gastrointestinal: Negative for nausea and vomiting.  Musculoskeletal:       + Pitting edema, bilateral feet  All other systems reviewed and are negative.   Physical Exam BP 110/79   Pulse 99   Temp 98 F (36.7 C) (Oral)   Ht 5\' 5"  (1.651 m)   Wt 121 lb 9.6 oz (55.2 kg)   SpO2 100%   BMI 20.24 kg/m   Physical Exam Vitals and nursing note reviewed. Exam conducted with a chaperone present.  Constitutional:      General: He is not in acute distress.    Appearance: Normal appearance. He is normal weight. He is not ill-appearing.     Comments: Thin appearing male, NAD, daughter at bedside  HENT:     Head: Normocephalic and atraumatic.  Eyes:     General: No scleral icterus.    Conjunctiva/sclera: Conjunctivae normal.     Pupils: Pupils are equal, round, and reactive to light.  Cardiovascular:     Rate and Rhythm: Normal rate and regular rhythm.     Pulses: Normal pulses.  Pulmonary:     Effort: Pulmonary effort is normal. No respiratory distress.     Breath sounds: Normal breath sounds.  Chest:     Comments: Chest tube site to the lateral left chest wall is well healed, no erythema Genitourinary:    Comments: Deferred Musculoskeletal:     Right lower leg: Edema present.     Left lower leg: Edema present.     Comments: + 3 pitting edema to the bilateral feet to the level of the ankles  Skin:    General: Skin is warm and dry.     Coloration: Skin is not pale.     Findings: No erythema.  Neurological:     General: No focal deficit present.     Mental Status: He is alert and oriented to person, place, and time.  Psychiatric:        Mood and Affect: Mood normal.        Behavior: Behavior normal.     Labs/Imaging:  CXR (01/25/2020) personally reviewed showing known bullous emphysematous changes without gross pneumothorax, and radiologist report pending   Assessment and Plan: This is a 69 y.o. male resolved spontaneous left pneumothorax  secondary to mos likely ruptured bullous emphysema, complicated by a history of stage IV lung cancer with metastatic disease on hospice.    - Nothing further from thoracic surgery standpoint  - Reviewed signs and symptoms of pneumothorax recurrence, encouraged to present to the ED if this happens  - He is scheduled for hospice follow up this afternoon; recommend addressing peripheral edema with them  - He can rtc on an as needed basis   Face-to-face time spent with the patient and care providers was 20 minutes, with more than 50% of the time spent counseling, educating, and coordinating care of the patient.     Edison Simon, PA-C [facility] 01/25/2020, 11:00 AM [phone] M-F: 7am - 4pm

## 2020-01-25 NOTE — Patient Instructions (Signed)
If you have any concerns or questions, please feel free to call our. If you start having SOB or severe pain, go to the ER. Follow up as needed.

## 2020-01-31 ENCOUNTER — Other Ambulatory Visit: Payer: Self-pay | Admitting: Hospice and Palliative Medicine

## 2020-01-31 MED ORDER — FENTANYL 25 MCG/HR TD PT72: 1.0000 | MEDICATED_PATCH | TRANSDERMAL | 0 refills | Status: AC

## 2020-01-31 MED ORDER — OXYCODONE HCL 5 MG PO TABS
5.0000 mg | ORAL_TABLET | ORAL | 0 refills | Status: AC | PRN
Start: 2020-01-31 — End: ?

## 2020-01-31 MED ORDER — AMOXICILLIN-POT CLAVULANATE 875-125 MG PO TABS: 1.0000 | ORAL_TABLET | Freq: Two times a day (BID) | ORAL | 0 refills | Status: AC

## 2020-01-31 NOTE — Progress Notes (Signed)
I spoke with patient's hospice nurse. Request for refill of fentanyl and oxycodone

## 2020-01-31 NOTE — Progress Notes (Signed)
I received a call from patient's hospice nurse, Ashby Dawes.  She reports the patient has a couple of days of shortness of breath, pulmonary congestion, and productive cough with dark-colored sputum.  She denies fever but patient has had some chills.  He is not interested in hospitalization or ED.  He is not interested in imaging.  We will start him empirically on antibiotics.  Plan: -Augmentin 875mg  twice daily x10 days

## 2020-02-12 ENCOUNTER — Telehealth: Payer: Self-pay

## 2020-02-12 NOTE — Telephone Encounter (Signed)
Death certification completed and signed by MD. [employer] [facility] to notify them that certificate is ready for pick up. Left certificate downstairs in designated book for pick up and copy was sent to scan in chart.

## 2020-02-16 DEATH — deceased

## 2020-03-04 ENCOUNTER — Ambulatory Visit: Payer: Self-pay | Admitting: Adult Health

## 2020-09-25 ENCOUNTER — Ambulatory Visit: Payer: Medicare Other | Admitting: Radiation Oncology
